# Patient Record
Sex: Male | Born: 1950 | Race: Black or African American | Hispanic: No | Marital: Married | Smoking: Former smoker
Health system: Southern US, Community
[De-identification: ages and names within clinical notes are randomized; demographics above are authoritative.]

## PROBLEM LIST (undated history)

## (undated) DIAGNOSIS — I10 Essential (primary) hypertension: Secondary | ICD-10-CM

## (undated) DIAGNOSIS — I739 Peripheral vascular disease, unspecified: Secondary | ICD-10-CM

## (undated) HISTORY — DX: Essential (primary) hypertension: I10

---

## 2007-01-31 HISTORY — PX: FEMUR SURGERY: SHX943

## 2007-12-27 ENCOUNTER — Inpatient Hospital Stay (HOSPITAL_COMMUNITY): Admission: AC | Admit: 2007-12-27 | Discharge: 2007-12-31 | Payer: Self-pay

## 2008-01-28 ENCOUNTER — Encounter (HOSPITAL_COMMUNITY): Admission: RE | Admit: 2008-01-28 | Discharge: 2008-01-30 | Payer: Self-pay | Admitting: Orthopedic Surgery

## 2008-02-03 ENCOUNTER — Encounter (HOSPITAL_COMMUNITY): Admission: RE | Admit: 2008-02-03 | Discharge: 2008-03-04 | Payer: Self-pay | Admitting: Orthopedic Surgery

## 2008-03-05 ENCOUNTER — Encounter (HOSPITAL_COMMUNITY): Admission: RE | Admit: 2008-03-05 | Discharge: 2008-04-04 | Payer: Self-pay | Admitting: Orthopedic Surgery

## 2008-04-07 ENCOUNTER — Encounter (HOSPITAL_COMMUNITY): Admission: RE | Admit: 2008-04-07 | Discharge: 2008-05-11 | Payer: Self-pay | Admitting: Orthopedic Surgery

## 2008-06-18 ENCOUNTER — Ambulatory Visit (HOSPITAL_COMMUNITY): Admission: RE | Admit: 2008-06-18 | Discharge: 2008-06-19 | Payer: Self-pay | Admitting: Orthopedic Surgery

## 2010-05-10 LAB — PROTIME-INR
INR: 0.9 (ref 0.00–1.49)
Prothrombin Time: 12.6 seconds (ref 11.6–15.2)

## 2010-05-10 LAB — COMPREHENSIVE METABOLIC PANEL
AST: 18 U/L (ref 0–37)
Albumin: 4 g/dL (ref 3.5–5.2)
Calcium: 10.2 mg/dL (ref 8.4–10.5)
Glucose, Bld: 100 mg/dL — ABNORMAL HIGH (ref 70–99)
Sodium: 141 mEq/L (ref 135–145)
Total Bilirubin: 0.4 mg/dL (ref 0.3–1.2)

## 2010-05-10 LAB — CBC: MCHC: 33.4 g/dL (ref 30.0–36.0)
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
## 2010-06-14 NOTE — Op Note (Signed)
Ryan [name], Ryan [name] NO.:  [id number]   MEDICAL RECORD NO.:  [id number]          PATIENT TYPE:  OIB   LOCATION:  5030                         FACILITY:  [facility]   PHYSICIAN:  Nadara Mustard, MD     DATE OF BIRTH:  12/07/1950   DATE OF PROCEDURE:  06/18/2008  DATE OF DISCHARGE:                               OPERATIVE REPORT   PREOPERATIVE DIAGNOSIS:  Nonunion, right femur.   POSTOPERATIVE DIAGNOSIS:  Nonunion, right femur.   PROCEDURES:  1. Removal of intramedullary nail.  2. Reaming of the fracture site to 14.5 mm.  3. Placement of intramedullary nail fixation, a 13 x 360 mm nail      locked distally with a 66-mm locking screw.   SURGEON:  Nadara Mustard, MD   ANESTHESIA:  General.   ESTIMATED BLOOD LOSS:  Minimal.   ANTIBIOTICS:  1 g of Kefzol.   DRAINS:  None.   COMPLICATIONS:  None.   TOURNIQUET TIME:  None.   DISPOSITION:  To PACU in stable condition.   INDICATIONS FOR PROCEDURE:  The patient is a 60 year old gentleman with  a nonunion of the femur fracture on the right.  He has failed surgical  intervention with IM nail fixation and presents at this time for  revision internal fixation.  Risks and benefits were discussed including  infection, neurovascular injury, persistent pain, DVT, pulmonary  embolus, and need for additional surgery.  The patient states he  understands and wished to proceed at this time.   DESCRIPTION OF PROCEDURE:  The patient was brought to OR room 10, and  underwent a general anesthetic.  After adequate level of anesthesia  obtained, the patient's right lower extremity was prepped using  DuraPrep, draped in a sterile field, and Ioban was used to cover all  exposed skin.  A lateral incision was made as well as a longitudinal  splitting approach to the patella tendon.  A C-arm fluoroscopy was used  to localize the screw and the screw was removed from the distal  interlock approximately removed half way.  Using a  guidewire followed by  the extraction bolt, the extraction bolt was inserted on the distal  aspect of the nail, and the distal locking screw was left in place to  stabilize the nail for rotation while the extraction bolt was secured.  After the extraction bolt was secured, the lateral screw was removed.  The nail was removed.  The femur was then sequentially reamed up to 14.5  mm for a 13-mm nail.  The 13 x 360 mm nail was inserted.  This was  locked distally with a 66-mm locking screw.  C-arm fluoroscopy verified  reduction both AP and lateral planes and there was good bony graft at  the fracture site secondary to reaming.  The knee was irrigated with  normal saline.  The subcu was closed using 2-0 Vicryl.  Skin was closed  using approximate staples.  The wounds were covered with Adaptic  orthopedic sponges, Kerlix, and Coban.  The patient was extubated and  taken to PACU in stable condition.  Plan for 23-hour observation and  discharge to home tomorrow.      Nadara Mustard, MD  Electronically Signed     MVD/MEDQ  D:  06/18/2008  T:  06/19/2008  Job:  [id number]

## 2010-11-01 LAB — CBC
HCT: 35.6 % — ABNORMAL LOW (ref 39.0–52.0)
HCT: 37.3 % — ABNORMAL LOW (ref 39.0–52.0)
Hemoglobin: 11.5 g/dL — ABNORMAL LOW (ref 13.0–17.0)
Hemoglobin: 11.9 g/dL — ABNORMAL LOW (ref 13.0–17.0)
MCHC: 32 g/dL (ref 30.0–36.0)
MCHC: 32.3 g/dL (ref 30.0–36.0)
MCV: 86.8 fL (ref 78.0–100.0)
Platelets: 212 10*3/uL (ref 150–400)
RBC: 4.1 MIL/uL — ABNORMAL LOW (ref 4.22–5.81)
RBC: 5.27 MIL/uL (ref 4.22–5.81)
RDW: 14.1 % (ref 11.5–15.5)
WBC: 10.2 10*3/uL (ref 4.0–10.5)
WBC: 7.9 10*3/uL (ref 4.0–10.5)

## 2010-11-01 LAB — TYPE AND SCREEN
ABO/RH(D): O POS
Antibody Screen: NEGATIVE

## 2010-11-01 LAB — BASIC METABOLIC PANEL
Calcium: 8.4 mg/dL (ref 8.4–10.5)
Calcium: 8.5 mg/dL (ref 8.4–10.5)
Chloride: 106 mEq/L (ref 96–112)
Creatinine, Ser: 0.61 mg/dL (ref 0.4–1.5)
GFR calc Af Amer: >60 mL/min (ref >60)
Glucose, Bld: 118 mg/dL — ABNORMAL HIGH (ref 70–99)
Potassium: 4.3 mEq/L (ref 3.5–5.1)
Sodium: 133 mEq/L — ABNORMAL LOW (ref 135–145)

## 2010-11-01 LAB — PROTIME-INR
INR: 1.4 (ref 0.00–1.49)
Prothrombin Time: 14.1 seconds (ref 11.6–15.2)

## 2010-11-01 LAB — POCT I-STAT, CHEM 8
BUN: <3 mg/dL — ABNORMAL LOW (ref 6–23)
Calcium, Ion: 1.18 mmol/L (ref 1.12–1.32)
Chloride: 104 meq/L (ref 96–112)
Creatinine, Ser: 0.7 mg/dL (ref 0.4–1.5)
HCT: 48 % (ref 39.0–52.0)

## 2010-11-01 LAB — ABO/RH: ABO/RH(D): O POS

## 2010-11-04 LAB — CBC
HCT: 34.8 % — ABNORMAL LOW (ref 39.0–52.0)
Hemoglobin: 11.5 g/dL — ABNORMAL LOW (ref 13.0–17.0)
WBC: 8.4 10*3/uL (ref 4.0–10.5)

## 2014-04-09 DIAGNOSIS — L0390 Cellulitis, unspecified: Secondary | ICD-10-CM | POA: Diagnosis not present

## 2014-04-09 DIAGNOSIS — I10 Essential (primary) hypertension: Secondary | ICD-10-CM | POA: Diagnosis not present

## 2014-04-10 DIAGNOSIS — L0390 Cellulitis, unspecified: Secondary | ICD-10-CM | POA: Diagnosis not present

## 2014-05-11 DIAGNOSIS — I739 Peripheral vascular disease, unspecified: Secondary | ICD-10-CM | POA: Diagnosis not present

## 2014-05-11 DIAGNOSIS — I10 Essential (primary) hypertension: Secondary | ICD-10-CM | POA: Diagnosis not present

## 2014-05-29 ENCOUNTER — Other Ambulatory Visit: Payer: Self-pay | Admitting: Family Medicine

## 2014-05-29 DIAGNOSIS — I10 Essential (primary) hypertension: Secondary | ICD-10-CM | POA: Diagnosis not present

## 2014-05-29 DIAGNOSIS — I739 Peripheral vascular disease, unspecified: Secondary | ICD-10-CM | POA: Diagnosis not present

## 2014-05-29 DIAGNOSIS — M79606 Pain in leg, unspecified: Secondary | ICD-10-CM

## 2014-06-02 ENCOUNTER — Ambulatory Visit
Admission: RE | Admit: 2014-06-02 | Discharge: 2014-06-02 | Disposition: A | Discharge status: Home / Self Care | Payer: Medicare Other | Source: Ambulatory Visit | Attending: Family Medicine | Admitting: Family Medicine

## 2014-06-02 DIAGNOSIS — M79606 Pain in leg, unspecified: Secondary | ICD-10-CM

## 2014-06-02 DIAGNOSIS — I739 Peripheral vascular disease, unspecified: Secondary | ICD-10-CM | POA: Diagnosis not present

## 2014-06-03 ENCOUNTER — Other Ambulatory Visit: Payer: Self-pay | Admitting: Family Medicine

## 2014-06-03 DIAGNOSIS — I739 Peripheral vascular disease, unspecified: Secondary | ICD-10-CM

## 2014-06-11 ENCOUNTER — Ambulatory Visit
Admission: RE | Admit: 2014-06-11 | Discharge: 2014-06-11 | Disposition: A | Discharge status: Home / Self Care | Payer: Medicare Other | Source: Ambulatory Visit | Attending: Family Medicine | Admitting: Family Medicine

## 2014-06-11 DIAGNOSIS — I739 Peripheral vascular disease, unspecified: Secondary | ICD-10-CM | POA: Diagnosis not present
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
## 2014-07-08 DIAGNOSIS — I10 Essential (primary) hypertension: Secondary | ICD-10-CM | POA: Diagnosis not present

## 2014-07-15 DIAGNOSIS — I10 Essential (primary) hypertension: Secondary | ICD-10-CM | POA: Diagnosis not present

## 2014-07-15 DIAGNOSIS — R9431 Abnormal electrocardiogram [ECG] [EKG]: Secondary | ICD-10-CM | POA: Diagnosis not present

## 2014-07-15 DIAGNOSIS — I998 Other disorder of circulatory system: Secondary | ICD-10-CM | POA: Diagnosis not present

## 2014-07-15 DIAGNOSIS — I739 Peripheral vascular disease, unspecified: Secondary | ICD-10-CM | POA: Diagnosis not present

## 2014-07-27 DIAGNOSIS — R9431 Abnormal electrocardiogram [ECG] [EKG]: Secondary | ICD-10-CM | POA: Diagnosis not present

## 2014-07-27 DIAGNOSIS — Z136 Encounter for screening for cardiovascular disorders: Secondary | ICD-10-CM | POA: Diagnosis not present

## 2014-08-05 DIAGNOSIS — I739 Peripheral vascular disease, unspecified: Secondary | ICD-10-CM | POA: Diagnosis not present

## 2014-08-05 DIAGNOSIS — I10 Essential (primary) hypertension: Secondary | ICD-10-CM | POA: Diagnosis not present
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
## 2014-08-11 ENCOUNTER — Encounter (HOSPITAL_COMMUNITY): Payer: Self-pay | Admitting: Cardiology

## 2014-08-11 ENCOUNTER — Ambulatory Visit (HOSPITAL_COMMUNITY)
Admission: RE | Admit: 2014-08-11 | Discharge: 2014-08-11 | Disposition: A | Discharge status: Home / Self Care | Payer: Medicare Other | Source: Ambulatory Visit | Attending: Cardiology | Admitting: Cardiology

## 2014-08-11 ENCOUNTER — Encounter (HOSPITAL_COMMUNITY)
Admission: RE | Disposition: A | Discharge status: Home / Self Care | Payer: Self-pay | Source: Ambulatory Visit | Attending: Cardiology

## 2014-08-11 DIAGNOSIS — I4510 Unspecified right bundle-branch block: Secondary | ICD-10-CM | POA: Insufficient documentation

## 2014-08-11 DIAGNOSIS — Z8249 Family history of ischemic heart disease and other diseases of the circulatory system: Secondary | ICD-10-CM | POA: Diagnosis not present

## 2014-08-11 DIAGNOSIS — I70213 Atherosclerosis of native arteries of extremities with intermittent claudication, bilateral legs: Secondary | ICD-10-CM | POA: Insufficient documentation

## 2014-08-11 DIAGNOSIS — F17210 Nicotine dependence, cigarettes, uncomplicated: Secondary | ICD-10-CM | POA: Insufficient documentation

## 2014-08-11 DIAGNOSIS — I739 Peripheral vascular disease, unspecified: Secondary | ICD-10-CM | POA: Diagnosis present

## 2014-08-11 DIAGNOSIS — I7092 Chronic total occlusion of artery of the extremities: Secondary | ICD-10-CM | POA: Diagnosis not present

## 2014-08-11 DIAGNOSIS — Z7982 Long term (current) use of aspirin: Secondary | ICD-10-CM | POA: Insufficient documentation

## 2014-08-11 DIAGNOSIS — I10 Essential (primary) hypertension: Secondary | ICD-10-CM | POA: Diagnosis not present

## 2014-08-11 HISTORY — PX: PERIPHERAL VASCULAR CATHETERIZATION: SHX172C

## 2014-08-11 LAB — POCT ACTIVATED CLOTTING TIME
ACTIVATED CLOTTING TIME: 257 s
ACTIVATED CLOTTING TIME: 214 s
Activated Clotting Time: 239 seconds
Activated Clotting Time: 263 seconds

## 2014-08-11 SURGERY — LOWER EXTREMITY ANGIOGRAPHY
Anesthesia: LOCAL

## 2014-08-11 MED ORDER — HEPARIN SODIUM (PORCINE) 1000 UNIT/ML IJ SOLN
INTRAMUSCULAR | Status: AC
  Filled 2014-08-11: qty 1

## 2014-08-11 MED ORDER — FENTANYL CITRATE (PF) 100 MCG/2ML IJ SOLN
INTRAMUSCULAR | Status: AC
  Filled 2014-08-11: qty 2

## 2014-08-11 MED ORDER — IODIXANOL 320 MG/ML IV SOLN
INTRAVENOUS | Status: DC | PRN
  Administered 2014-08-11: 268 mL via INTRAVENOUS

## 2014-08-11 MED ORDER — CLOPIDOGREL BISULFATE 75 MG PO TABS: 75.0000 mg | ORAL_TABLET | Freq: Every day | ORAL | Status: DC

## 2014-08-11 MED ORDER — LIDOCAINE HCL (PF) 1 % IJ SOLN
INTRAMUSCULAR | Status: AC
  Filled 2014-08-11: qty 30

## 2014-08-11 MED ORDER — CLOPIDOGREL BISULFATE 75 MG PO TABS
600.0000 mg | ORAL_TABLET | Freq: Once | ORAL | Status: AC
  Administered 2014-08-11: 600 mg via ORAL

## 2014-08-11 MED ORDER — FENTANYL CITRATE (PF) 100 MCG/2ML IJ SOLN
INTRAMUSCULAR | Status: DC | PRN
  Administered 2014-08-11 (×2): 25 ug via INTRAVENOUS

## 2014-08-11 MED ORDER — NITROGLYCERIN 1 MG/10 ML FOR IR/CATH LAB
INTRA_ARTERIAL | Status: DC | PRN
  Administered 2014-08-11: 11:00:00

## 2014-08-11 MED ORDER — HYDROMORPHONE BOLUS VIA INFUSION
0.5000 mg | Freq: Once | INTRAVENOUS | Status: DC
Start: 2014-08-11 — End: 2014-08-11

## 2014-08-11 MED ORDER — SODIUM CHLORIDE 0.9 % IV SOLN
INTRAVENOUS | Status: DC
  Administered 2014-08-11: 08:00:00 via INTRAVENOUS

## 2014-08-11 MED ORDER — NITROGLYCERIN 1 MG/10 ML FOR IR/CATH LAB
INTRA_ARTERIAL | Status: AC
  Filled 2014-08-11: qty 10

## 2014-08-11 MED ORDER — CLOPIDOGREL BISULFATE 300 MG PO TABS
ORAL_TABLET | ORAL | Status: AC
  Filled 2014-08-11: qty 2

## 2014-08-11 MED ORDER — HYDROMORPHONE HCL 1 MG/ML IJ SOLN
INTRAMUSCULAR | Status: AC
  Administered 2014-08-11: 0.5 mg
  Filled 2014-08-11: qty 1

## 2014-08-11 MED ORDER — HEPARIN SODIUM (PORCINE) 1000 UNIT/ML IJ SOLN
INTRAMUSCULAR | Status: DC | PRN
  Administered 2014-08-11 (×2): 2000 [IU] via INTRAVENOUS
  Administered 2014-08-11: 6000 [IU] via INTRAVENOUS
  Administered 2014-08-11: 2000 [IU] via INTRAVENOUS

## 2014-08-11 MED ORDER — SODIUM CHLORIDE 0.9 % IV SOLN: 1.0000 mL/kg/h | INTRAVENOUS | Status: DC

## 2014-08-11 MED ORDER — HEPARIN (PORCINE) IN NACL 2-0.9 UNIT/ML-% IJ SOLN
INTRAMUSCULAR | Status: AC
  Filled 2014-08-11: qty 1000

## 2014-08-11 SURGICAL SUPPLY — 31 items
BALLOON SABER 5.0X250X150 (BALLOONS) ×2 IMPLANT
CATH ANGIO 5F PIGTAIL 65CM (CATHETERS) ×2 IMPLANT
CATH CXI SUPP ST 2.6FR 150CM (MICROCATHETER) ×2 IMPLANT
CATH MICROGUIDE FINCRSS 150 CM (MICROCATHETER) ×1 IMPLANT
CATH QUICKCROSS SUPP .035X90CM (MICROCATHETER) ×2 IMPLANT
CATH SOFT-VU ST 4F 90CM (CATHETERS) ×2 IMPLANT
CATH VIANCE CROSS STAND 150CM (MICROCATHETER) ×2
CATH VIANCE CROSS STD 150CM (MICROCATHETER) ×1 IMPLANT
DEVICE TORQUE .014-.018 (MISCELLANEOUS) ×1 IMPLANT
GLIDEWIRE ADV .035X180CM (WIRE) ×2 IMPLANT
HAND CONTROLLER AVANTA (MISCELLANEOUS) ×2 IMPLANT
KIT ENCORE 26 ADVANTAGE (KITS) ×2 IMPLANT
KIT PV (KITS) ×2 IMPLANT
MICROGUIDE FINECROSS 150 CM (MICROCATHETER) ×2
SET AVANTA MULTI PATIENT (MISCELLANEOUS) IMPLANT
SET AVANTA SINGLE PATIENT (MISCELLANEOUS) ×2 IMPLANT
SHEATH AVANTA HAND CONTROLLER (MISCELLANEOUS) ×2 IMPLANT
SHEATH FLEXOR ANSEL 1 7F 45CM (SHEATH) ×2 IMPLANT
SHEATH PINNACLE 5F 10CM (SHEATH) ×2 IMPLANT
STENT VIABAHN 6X150X120 (Permanent Stent) ×2 IMPLANT
STENT VIABAHN 6X250X120 (Permanent Stent) ×2 IMPLANT
SYR MEDRAD MARK V 150ML (SYRINGE) IMPLANT
TAPE RADIOPAQUE TURBO (MISCELLANEOUS) ×4 IMPLANT
TORQUE DEVICE .014-.018 (MISCELLANEOUS) ×2
TRANSDUCER W/STOPCOCK (MISCELLANEOUS) ×2 IMPLANT
TRAY PV CATH (CUSTOM PROCEDURE TRAY) ×2 IMPLANT
WIRE APROACH 12G .014X300CM (WIRE) ×2 IMPLANT
WIRE APROACH 18G .014X300CM (WIRE) ×2 IMPLANT
WIRE HI TORQ BMW 300CM (WIRE) ×2 IMPLANT
WIRE HI TORQ WHISPER MS 300CM (WIRE) ×2 IMPLANT
WIRE HITORQ VERSACORE ST 145CM (WIRE) ×2 IMPLANT

## 2014-08-11 NOTE — Progress Notes (Signed)
Site area: rt groin Site Prior to Removal:  Level 0 Pressure Applied For:  40 minutes Manual:   yes Patient Status During Pull:  stable Post Pull Site:  Level  0 Post Pull Instructions Given:  yes Post Pull Pulses Present: yes Dressing Applied:  tegaderm Bedrest begins @  1410  Comments:  0

## 2014-08-11 NOTE — Progress Notes (Signed)
C/o left toe pain 5/10 gradually increasing. left Foot and leg are warm, no changes in pulses, Dr Jacinto HalimGanji was notified and orders followed.

## 2014-08-11 NOTE — Discharge Instructions (Signed)
Start plavix/ clopedigrel 75 mg daily   Angiogram, Care After Refer to this sheet in the next few weeks. These instructions provide you with information on caring for yourself after your procedure. Your health care provider may also give you more specific instructions. Your treatment has been planned according to current medical practices, but problems sometimes occur. Call your health care provider if you have any problems or questions after your procedure.  WHAT TO EXPECT AFTER THE PROCEDURE After your procedure, it is typical to have the following sensations:  Minor discomfort or tenderness and a small bump at the catheter insertion site. The bump should usually decrease in size and tenderness within 1 to 2 weeks.  Any bruising will usually fade within 2 to 4 weeks. HOME CARE INSTRUCTIONS   You may need to keep taking blood thinners if they were prescribed for you. Take medicines only as directed by your health care provider.  Do not apply powder or lotion to the site.  Do not take baths, swim, or use a hot tub until your health care provider approves.  You may shower 24 hours after the procedure. Remove the bandage (dressing) and gently wash the site with plain soap and water. Gently pat the site dry.  Inspect the site at least twice daily.  Limit your activity for the first 48 hours. Do not bend, squat, or lift anything over 20 lb (9 kg) or as directed by your health care provider.  Plan to have someone take you home after the procedure. Follow instructions about when you can drive or return to work. SEEK MEDICAL CARE IF:  You get light-headed when standing up.  You have drainage (other than a small amount of blood on the dressing).  You have chills.  You have a fever.  You have redness, warmth, swelling, or pain at the insertion site. SEEK IMMEDIATE MEDICAL CARE IF:   You develop chest pain or shortness of breath, feel faint, or pass out.  You have bleeding, swelling  larger than a walnut, or drainage from the catheter insertion site.  You develop pain, discoloration, coldness, or severe bruising in the leg or arm that held the catheter.  You develop bleeding from any other place, such as the bowels. You may see bright red blood in your urine or stools, or your stools may appear black and tarry.  You have heavy bleeding from the site. If this happens, hold pressure on the site. CALL 911 MAKE SURE YOU:  Understand these instructions.  Will watch your condition.  Will get help right away if you are not doing well or get worse. Document Released: 08/04/2004 Document Revised: 06/02/2013 Document Reviewed: 06/10/2012 [facility] Patient Information 2015 [place], [place]. This information is not intended to replace advice given to you by your health care provider. Make sure you discuss any questions you have with your health care provider.

## 2014-08-11 NOTE — Progress Notes (Signed)
Unable to aspirate from arterial sheath. Rt dp dopplered. Dr. Jacinto HalimGanji made aware. To pull at 1330

## 2014-08-11 NOTE — Interval H&P Note (Signed)
History and Physical Interval Note:  08/11/2014 8:51 AM  Ryan [name]  has presented today for surgery, with the diagnosis of pad  The various methods of treatment have been discussed with the patient and family. After consideration of risks, benefits and other options for treatment, the patient has consented to  Procedure(s): Lower Extremity Angiography (N/A) and possible PTA as a surgical intervention .  The patient's history has been reviewed, patient examined, no change in status, stable for surgery.  I have reviewed the patient's chart and labs.  Questions were answered to the patient's satisfaction.     Yates DecampGANJI, Mirta Mally

## 2014-08-20 DIAGNOSIS — I998 Other disorder of circulatory system: Secondary | ICD-10-CM | POA: Diagnosis not present

## 2014-08-20 DIAGNOSIS — I10 Essential (primary) hypertension: Secondary | ICD-10-CM | POA: Diagnosis not present

## 2014-08-20 DIAGNOSIS — I739 Peripheral vascular disease, unspecified: Secondary | ICD-10-CM | POA: Diagnosis not present

## 2014-08-20 DIAGNOSIS — R9431 Abnormal electrocardiogram [ECG] [EKG]: Secondary | ICD-10-CM | POA: Diagnosis not present

## 2014-10-08 DIAGNOSIS — M79604 Pain in right leg: Secondary | ICD-10-CM | POA: Diagnosis not present

## 2014-10-08 DIAGNOSIS — Z7982 Long term (current) use of aspirin: Secondary | ICD-10-CM | POA: Diagnosis not present

## 2014-10-08 DIAGNOSIS — Z7902 Long term (current) use of antithrombotics/antiplatelets: Secondary | ICD-10-CM | POA: Diagnosis not present

## 2014-10-08 DIAGNOSIS — I771 Stricture of artery: Secondary | ICD-10-CM | POA: Diagnosis not present

## 2014-10-08 DIAGNOSIS — I259 Chronic ischemic heart disease, unspecified: Secondary | ICD-10-CM | POA: Diagnosis not present

## 2014-10-08 DIAGNOSIS — F17210 Nicotine dependence, cigarettes, uncomplicated: Secondary | ICD-10-CM | POA: Diagnosis not present

## 2014-10-08 DIAGNOSIS — I743 Embolism and thrombosis of arteries of the lower extremities: Secondary | ICD-10-CM | POA: Diagnosis not present

## 2014-10-08 DIAGNOSIS — I10 Essential (primary) hypertension: Secondary | ICD-10-CM | POA: Diagnosis not present

## 2014-10-08 DIAGNOSIS — I739 Peripheral vascular disease, unspecified: Secondary | ICD-10-CM | POA: Diagnosis not present

## 2014-10-09 DIAGNOSIS — F17210 Nicotine dependence, cigarettes, uncomplicated: Secondary | ICD-10-CM | POA: Diagnosis not present

## 2014-10-09 DIAGNOSIS — I259 Chronic ischemic heart disease, unspecified: Secondary | ICD-10-CM | POA: Diagnosis not present

## 2014-10-09 DIAGNOSIS — M79604 Pain in right leg: Secondary | ICD-10-CM | POA: Diagnosis not present

## 2014-10-09 DIAGNOSIS — Z720 Tobacco use: Secondary | ICD-10-CM | POA: Insufficient documentation

## 2014-10-09 DIAGNOSIS — I743 Embolism and thrombosis of arteries of the lower extremities: Secondary | ICD-10-CM | POA: Diagnosis not present

## 2014-10-09 DIAGNOSIS — I739 Peripheral vascular disease, unspecified: Secondary | ICD-10-CM | POA: Diagnosis not present

## 2014-10-09 DIAGNOSIS — I10 Essential (primary) hypertension: Secondary | ICD-10-CM | POA: Insufficient documentation

## 2014-11-04 DIAGNOSIS — I739 Peripheral vascular disease, unspecified: Secondary | ICD-10-CM | POA: Diagnosis not present

## 2014-11-04 DIAGNOSIS — I998 Other disorder of circulatory system: Secondary | ICD-10-CM | POA: Diagnosis not present

## 2014-11-27 DIAGNOSIS — I10 Essential (primary) hypertension: Secondary | ICD-10-CM | POA: Diagnosis not present

## 2014-11-27 DIAGNOSIS — I998 Other disorder of circulatory system: Secondary | ICD-10-CM | POA: Diagnosis not present

## 2014-11-27 DIAGNOSIS — I739 Peripheral vascular disease, unspecified: Secondary | ICD-10-CM | POA: Diagnosis not present

## 2014-11-27 DIAGNOSIS — F17209 Nicotine dependence, unspecified, with unspecified nicotine-induced disorders: Secondary | ICD-10-CM | POA: Diagnosis not present

## 2015-03-08 DIAGNOSIS — I10 Essential (primary) hypertension: Secondary | ICD-10-CM | POA: Diagnosis not present

## 2015-03-08 DIAGNOSIS — E785 Hyperlipidemia, unspecified: Secondary | ICD-10-CM | POA: Diagnosis not present

## 2015-03-08 DIAGNOSIS — Z6825 Body mass index (BMI) 25.0-25.9, adult: Secondary | ICD-10-CM | POA: Diagnosis not present

## 2015-03-15 DIAGNOSIS — R7309 Other abnormal glucose: Secondary | ICD-10-CM | POA: Diagnosis not present

## 2015-03-15 DIAGNOSIS — Z716 Tobacco abuse counseling: Secondary | ICD-10-CM | POA: Diagnosis not present

## 2015-03-15 DIAGNOSIS — I7389 Other specified peripheral vascular diseases: Secondary | ICD-10-CM | POA: Diagnosis not present

## 2015-03-15 DIAGNOSIS — I10 Essential (primary) hypertension: Secondary | ICD-10-CM | POA: Diagnosis not present

## 2015-03-22 DIAGNOSIS — E118 Type 2 diabetes mellitus with unspecified complications: Secondary | ICD-10-CM | POA: Diagnosis not present

## 2015-04-19 DIAGNOSIS — E1165 Type 2 diabetes mellitus with hyperglycemia: Secondary | ICD-10-CM | POA: Diagnosis not present

## 2015-04-19 DIAGNOSIS — I10 Essential (primary) hypertension: Secondary | ICD-10-CM | POA: Diagnosis not present

## 2015-04-19 DIAGNOSIS — Z6825 Body mass index (BMI) 25.0-25.9, adult: Secondary | ICD-10-CM | POA: Diagnosis not present

## 2015-05-24 DIAGNOSIS — E118 Type 2 diabetes mellitus with unspecified complications: Secondary | ICD-10-CM | POA: Diagnosis not present

## 2015-08-23 DIAGNOSIS — T676XXA Heat fatigue, transient, initial encounter: Secondary | ICD-10-CM | POA: Diagnosis not present

## 2015-08-23 DIAGNOSIS — F17200 Nicotine dependence, unspecified, uncomplicated: Secondary | ICD-10-CM | POA: Diagnosis not present

## 2015-08-23 DIAGNOSIS — I10 Essential (primary) hypertension: Secondary | ICD-10-CM | POA: Diagnosis not present

## 2015-08-23 DIAGNOSIS — Z125 Encounter for screening for malignant neoplasm of prostate: Secondary | ICD-10-CM | POA: Diagnosis not present

## 2015-08-23 DIAGNOSIS — E118 Type 2 diabetes mellitus with unspecified complications: Secondary | ICD-10-CM | POA: Diagnosis not present

## 2015-11-01 DIAGNOSIS — I10 Essential (primary) hypertension: Secondary | ICD-10-CM | POA: Diagnosis not present

## 2015-11-01 DIAGNOSIS — F17210 Nicotine dependence, cigarettes, uncomplicated: Secondary | ICD-10-CM | POA: Diagnosis not present

## 2015-11-01 DIAGNOSIS — E119 Type 2 diabetes mellitus without complications: Secondary | ICD-10-CM | POA: Diagnosis not present

## 2015-11-01 DIAGNOSIS — Z23 Encounter for immunization: Secondary | ICD-10-CM | POA: Diagnosis not present

## 2016-02-14 ENCOUNTER — Other Ambulatory Visit (HOSPITAL_COMMUNITY): Payer: Self-pay | Admitting: Family Medicine

## 2016-02-14 DIAGNOSIS — R1310 Dysphagia, unspecified: Secondary | ICD-10-CM

## 2016-02-14 DIAGNOSIS — E118 Type 2 diabetes mellitus with unspecified complications: Secondary | ICD-10-CM | POA: Diagnosis not present

## 2016-02-14 DIAGNOSIS — I10 Essential (primary) hypertension: Secondary | ICD-10-CM | POA: Diagnosis not present

## 2016-02-18 ENCOUNTER — Ambulatory Visit (HOSPITAL_COMMUNITY)
Admission: RE | Admit: 2016-02-18 | Discharge: 2016-02-18 | Disposition: A | Discharge status: Home / Self Care | Payer: Medicare Other | Source: Ambulatory Visit | Attending: Family Medicine | Admitting: Family Medicine

## 2016-02-18 ENCOUNTER — Ambulatory Visit (HOSPITAL_COMMUNITY): Payer: Medicare Other

## 2016-02-18 DIAGNOSIS — K449 Diaphragmatic hernia without obstruction or gangrene: Secondary | ICD-10-CM | POA: Diagnosis not present

## 2016-02-18 DIAGNOSIS — R1310 Dysphagia, unspecified: Secondary | ICD-10-CM | POA: Diagnosis not present

## 2016-03-15 DIAGNOSIS — E785 Hyperlipidemia, unspecified: Secondary | ICD-10-CM | POA: Diagnosis not present

## 2016-03-15 DIAGNOSIS — I10 Essential (primary) hypertension: Secondary | ICD-10-CM | POA: Diagnosis not present

## 2016-07-14 DIAGNOSIS — R634 Abnormal weight loss: Secondary | ICD-10-CM | POA: Diagnosis not present

## 2016-07-14 DIAGNOSIS — I10 Essential (primary) hypertension: Secondary | ICD-10-CM | POA: Diagnosis not present

## 2016-07-14 DIAGNOSIS — F17210 Nicotine dependence, cigarettes, uncomplicated: Secondary | ICD-10-CM | POA: Diagnosis not present

## 2016-07-14 DIAGNOSIS — I70292 Other atherosclerosis of native arteries of extremities, left leg: Secondary | ICD-10-CM | POA: Diagnosis not present

## 2016-07-14 DIAGNOSIS — E118 Type 2 diabetes mellitus with unspecified complications: Secondary | ICD-10-CM | POA: Diagnosis not present

## 2016-07-31 DIAGNOSIS — I10 Essential (primary) hypertension: Secondary | ICD-10-CM | POA: Diagnosis not present

## 2016-07-31 DIAGNOSIS — F17290 Nicotine dependence, other tobacco product, uncomplicated: Secondary | ICD-10-CM | POA: Diagnosis not present

## 2016-07-31 DIAGNOSIS — E118 Type 2 diabetes mellitus with unspecified complications: Secondary | ICD-10-CM | POA: Diagnosis not present

## 2016-07-31 DIAGNOSIS — L608 Other nail disorders: Secondary | ICD-10-CM | POA: Diagnosis not present

## 2016-08-08 DIAGNOSIS — B351 Tinea unguium: Secondary | ICD-10-CM | POA: Diagnosis not present

## 2016-08-08 DIAGNOSIS — M19072 Primary osteoarthritis, left ankle and foot: Secondary | ICD-10-CM | POA: Diagnosis not present

## 2016-08-08 DIAGNOSIS — M19071 Primary osteoarthritis, right ankle and foot: Secondary | ICD-10-CM | POA: Diagnosis not present

## 2016-08-15 DIAGNOSIS — B353 Tinea pedis: Secondary | ICD-10-CM | POA: Diagnosis not present

## 2016-08-29 DIAGNOSIS — B353 Tinea pedis: Secondary | ICD-10-CM | POA: Diagnosis not present

## 2016-09-12 DIAGNOSIS — B351 Tinea unguium: Secondary | ICD-10-CM | POA: Diagnosis not present

## 2016-10-26 DIAGNOSIS — I739 Peripheral vascular disease, unspecified: Secondary | ICD-10-CM | POA: Diagnosis not present

## 2016-10-26 DIAGNOSIS — F17209 Nicotine dependence, unspecified, with unspecified nicotine-induced disorders: Secondary | ICD-10-CM | POA: Diagnosis not present

## 2016-10-26 DIAGNOSIS — I10 Essential (primary) hypertension: Secondary | ICD-10-CM | POA: Diagnosis not present

## 2016-10-26 DIAGNOSIS — R9431 Abnormal electrocardiogram [ECG] [EKG]: Secondary | ICD-10-CM | POA: Diagnosis not present

## 2016-11-10 DIAGNOSIS — I739 Peripheral vascular disease, unspecified: Secondary | ICD-10-CM | POA: Diagnosis not present

## 2016-11-23 DIAGNOSIS — R9431 Abnormal electrocardiogram [ECG] [EKG]: Secondary | ICD-10-CM | POA: Diagnosis not present

## 2016-11-23 DIAGNOSIS — I739 Peripheral vascular disease, unspecified: Secondary | ICD-10-CM | POA: Diagnosis not present
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
## 2016-11-28 ENCOUNTER — Encounter (HOSPITAL_COMMUNITY)
Admission: RE | Disposition: A | Discharge status: Home / Self Care | Payer: Self-pay | Source: Ambulatory Visit | Attending: Cardiology

## 2016-11-28 ENCOUNTER — Inpatient Hospital Stay (HOSPITAL_COMMUNITY)
Admission: RE | Admit: 2016-11-28 | Discharge: 2016-11-30 | DRG: 272 | Disposition: A | Discharge status: Home / Self Care | Payer: Medicare Other | Source: Ambulatory Visit | Attending: Cardiology | Admitting: Cardiology

## 2016-11-28 ENCOUNTER — Encounter (HOSPITAL_COMMUNITY): Payer: Self-pay | Admitting: Cardiology

## 2016-11-28 DIAGNOSIS — F17210 Nicotine dependence, cigarettes, uncomplicated: Secondary | ICD-10-CM | POA: Diagnosis present

## 2016-11-28 DIAGNOSIS — Z79899 Other long term (current) drug therapy: Secondary | ICD-10-CM

## 2016-11-28 DIAGNOSIS — I739 Peripheral vascular disease, unspecified: Secondary | ICD-10-CM | POA: Diagnosis present

## 2016-11-28 DIAGNOSIS — T82868A Thrombosis of vascular prosthetic devices, implants and grafts, initial encounter: Secondary | ICD-10-CM | POA: Diagnosis present

## 2016-11-28 DIAGNOSIS — Z716 Tobacco abuse counseling: Secondary | ICD-10-CM

## 2016-11-28 DIAGNOSIS — Y838 Other surgical procedures as the cause of abnormal reaction of the patient, or of later complication, without mention of misadventure at the time of the procedure: Secondary | ICD-10-CM | POA: Diagnosis present

## 2016-11-28 DIAGNOSIS — I70213 Atherosclerosis of native arteries of extremities with intermittent claudication, bilateral legs: Secondary | ICD-10-CM | POA: Diagnosis present

## 2016-11-28 DIAGNOSIS — Z7982 Long term (current) use of aspirin: Secondary | ICD-10-CM

## 2016-11-28 DIAGNOSIS — Z833 Family history of diabetes mellitus: Secondary | ICD-10-CM | POA: Diagnosis not present

## 2016-11-28 DIAGNOSIS — E785 Hyperlipidemia, unspecified: Secondary | ICD-10-CM | POA: Diagnosis present

## 2016-11-28 DIAGNOSIS — I10 Essential (primary) hypertension: Secondary | ICD-10-CM | POA: Diagnosis present

## 2016-11-28 HISTORY — PX: LOWER EXTREMITY ANGIOGRAPHY: CATH118251

## 2016-11-28 HISTORY — PX: PERIPHERAL VASCULAR BALLOON ANGIOPLASTY: CATH118281

## 2016-11-28 HISTORY — PX: PERIPHERAL VASCULAR THROMBECTOMY: CATH118306

## 2016-11-28 HISTORY — PX: ABDOMINAL AORTOGRAM W/LOWER EXTREMITY: CATH118223

## 2016-11-28 LAB — CBC
HCT: 36.3 % — ABNORMAL LOW (ref 39.0–52.0)
HCT: 38.5 % — ABNORMAL LOW (ref 39.0–52.0)
HCT: 35.8 % — ABNORMAL LOW (ref 39.0–52.0)
HEMOGLOBIN: 11.6 g/dL — AB (ref 13.0–17.0)
HEMOGLOBIN: 11.7 g/dL — AB (ref 13.0–17.0)
Hemoglobin: 12.2 g/dL — ABNORMAL LOW (ref 13.0–17.0)
MCH: 27.1 pg (ref 26.0–34.0)
MCH: 26.9 pg (ref 26.0–34.0)
MCH: 27.6 pg (ref 26.0–34.0)
MCHC: 32 g/dL (ref 30.0–36.0)
MCHC: 31.7 g/dL (ref 30.0–36.0)
MCHC: 32.7 g/dL (ref 30.0–36.0)
MCV: 84.8 fL (ref 78.0–100.0)
MCV: 85 fL (ref 78.0–100.0)
MCV: 84.4 fL (ref 78.0–100.0)
PLATELETS: 207 10*3/uL (ref 150–400)
Platelets: 207 10*3/uL (ref 150–400)
Platelets: 187 10*3/uL (ref 150–400)
RBC: 4.28 MIL/uL (ref 4.22–5.81)
RBC: 4.53 MIL/uL (ref 4.22–5.81)
RBC: 4.24 MIL/uL (ref 4.22–5.81)
RDW: 14.3 % (ref 11.5–15.5)
RDW: 14.4 % (ref 11.5–15.5)
RDW: 14 % (ref 11.5–15.5)
WBC: 5.2 10*3/uL (ref 4.0–10.5)
WBC: 7.1 10*3/uL (ref 4.0–10.5)
WBC: 6.3 10*3/uL (ref 4.0–10.5)

## 2016-11-28 LAB — POCT ACTIVATED CLOTTING TIME
ACTIVATED CLOTTING TIME: 197 s
ACTIVATED CLOTTING TIME: 428 s
Activated Clotting Time: 318 seconds

## 2016-11-28 LAB — MRSA PCR SCREENING: MRSA BY PCR: NEGATIVE

## 2016-11-28 LAB — HEPARIN LEVEL (UNFRACTIONATED): HEPARIN UNFRACTIONATED: 0.14 [IU]/mL — AB (ref 0.30–0.70)

## 2016-11-28 LAB — FIBRINOGEN
FIBRINOGEN: 446 mg/dL (ref 210–475)
FIBRINOGEN: 448 mg/dL (ref 210–475)
Fibrinogen: 450 mg/dL (ref 210–475)

## 2016-11-28 SURGERY — LOWER EXTREMITY ANGIOGRAPHY
Anesthesia: LOCAL | Laterality: Left

## 2016-11-28 SURGERY — ABDOMINAL AORTOGRAM W/LOWER EXTREMITY
Anesthesia: LOCAL

## 2016-11-28 MED ORDER — FENTANYL CITRATE (PF) 100 MCG/2ML IJ SOLN
INTRAMUSCULAR | Status: AC
  Filled 2016-11-28: qty 2

## 2016-11-28 MED ORDER — SODIUM CHLORIDE 0.9 % IV SOLN: 250.0000 mL | INTRAVENOUS | Status: DC | PRN

## 2016-11-28 MED ORDER — HEPARIN SODIUM (PORCINE) 1000 UNIT/ML IJ SOLN
INTRAMUSCULAR | Status: AC
  Filled 2016-11-28: qty 1

## 2016-11-28 MED ORDER — MORPHINE SULFATE (PF) 10 MG/ML IV SOLN: 5.0000 mg | INTRAVENOUS | Status: DC | PRN

## 2016-11-28 MED ORDER — SODIUM CHLORIDE 0.9 % IV SOLN
INTRAVENOUS | Status: DC
  Filled 2016-11-28: qty 10

## 2016-11-28 MED ORDER — ASPIRIN EC 81 MG PO TBEC
81.0000 mg | DELAYED_RELEASE_TABLET | Freq: Every day | ORAL | Status: DC
  Administered 2016-11-29 – 2016-11-30 (×2): 81 mg via ORAL
  Filled 2016-11-28 (×3): qty 1

## 2016-11-28 MED ORDER — ONDANSETRON HCL 4 MG/2ML IJ SOLN: 4.0000 mg | Freq: Four times a day (QID) | INTRAMUSCULAR | Status: DC | PRN

## 2016-11-28 MED ORDER — SODIUM CHLORIDE 0.9% FLUSH: 3.0000 mL | INTRAVENOUS | Status: DC | PRN

## 2016-11-28 MED ORDER — ATENOLOL 25 MG PO TABS
50.0000 mg | ORAL_TABLET | Freq: Every day | ORAL | Status: DC
  Administered 2016-11-29: 50 mg via ORAL
  Filled 2016-11-28 (×2): qty 2

## 2016-11-28 MED ORDER — TIROFIBAN (AGGRASTAT) BOLUS VIA INFUSION
INTRAVENOUS | Status: DC | PRN
  Administered 2016-11-28: 1735 ug via INTRAVENOUS

## 2016-11-28 MED ORDER — HYDRALAZINE HCL 20 MG/ML IJ SOLN
INTRAMUSCULAR | Status: DC | PRN
  Administered 2016-11-28 (×2): 10 mg via INTRAVENOUS

## 2016-11-28 MED ORDER — LABETALOL HCL 5 MG/ML IV SOLN: 10.0000 mg | INTRAVENOUS | Status: DC | PRN

## 2016-11-28 MED ORDER — MIDAZOLAM HCL 2 MG/2ML IJ SOLN
INTRAMUSCULAR | Status: DC | PRN
  Administered 2016-11-28: 0.5 mg via INTRAVENOUS

## 2016-11-28 MED ORDER — ROSUVASTATIN CALCIUM 20 MG PO TABS
20.0000 mg | ORAL_TABLET | Freq: Every day | ORAL | Status: DC
  Administered 2016-11-29: 20 mg via ORAL
  Filled 2016-11-28 (×2): qty 1

## 2016-11-28 MED ORDER — HYDRALAZINE HCL 20 MG/ML IJ SOLN: 5.0000 mg | INTRAMUSCULAR | Status: DC | PRN

## 2016-11-28 MED ORDER — MORPHINE SULFATE (PF) 4 MG/ML IV SOLN
INTRAVENOUS | Status: AC
  Administered 2016-11-28: 5 mg via INTRAVENOUS
  Filled 2016-11-28: qty 2

## 2016-11-28 MED ORDER — SODIUM CHLORIDE 0.9 % IV SOLN: INTRAVENOUS | Status: DC

## 2016-11-28 MED ORDER — NITROGLYCERIN 1 MG/10 ML FOR IR/CATH LAB
INTRA_ARTERIAL | Status: AC
  Filled 2016-11-28: qty 10

## 2016-11-28 MED ORDER — IODIXANOL 320 MG/ML IV SOLN
INTRAVENOUS | Status: DC | PRN
  Administered 2016-11-28: 85 mL via INTRA_ARTERIAL

## 2016-11-28 MED ORDER — TIROFIBAN HCL IN NACL 5-0.9 MG/100ML-% IV SOLN
INTRAVENOUS | Status: DC | PRN
  Administered 2016-11-28: 0.15 ug/kg/min via INTRAVENOUS

## 2016-11-28 MED ORDER — LIDOCAINE HCL 2 % IJ SOLN
INTRAMUSCULAR | Status: AC
  Filled 2016-11-28: qty 20

## 2016-11-28 MED ORDER — MIDAZOLAM HCL 2 MG/2ML IJ SOLN
INTRAMUSCULAR | Status: DC | PRN
  Administered 2016-11-28 (×3): 1 mg via INTRAVENOUS

## 2016-11-28 MED ORDER — HYDRALAZINE HCL 20 MG/ML IJ SOLN
INTRAMUSCULAR | Status: AC
  Filled 2016-11-28: qty 1

## 2016-11-28 MED ORDER — CLOPIDOGREL BISULFATE 75 MG PO TABS
75.0000 mg | ORAL_TABLET | Freq: Every day | ORAL | Status: DC
  Administered 2016-11-29 – 2016-11-30 (×2): 75 mg via ORAL
  Filled 2016-11-28 (×3): qty 1

## 2016-11-28 MED ORDER — HEPARIN (PORCINE) IN NACL 100-0.45 UNIT/ML-% IJ SOLN: 750.0000 [IU]/h | INTRAMUSCULAR | Status: DC

## 2016-11-28 MED ORDER — SODIUM CHLORIDE 0.9 % IV SOLN
0.2500 mg/h | INTRAVENOUS | Status: AC
  Filled 2016-11-28 (×3): qty 10

## 2016-11-28 MED ORDER — NITROGLYCERIN 1 MG/10 ML FOR IR/CATH LAB
INTRA_ARTERIAL | Status: DC | PRN
Start: 2016-11-28 — End: 2016-11-28
  Administered 2016-11-28: 300 ug via INTRA_ARTERIAL
  Administered 2016-11-28: 200 ug via INTRA_ARTERIAL

## 2016-11-28 MED ORDER — SODIUM CHLORIDE 0.9 % IV SOLN
1.0000 mg/h | INTRAVENOUS | Status: DC
  Administered 2016-11-28: 1 mg/h
  Filled 2016-11-28: qty 10

## 2016-11-28 MED ORDER — NITROGLYCERIN IN D5W 200-5 MCG/ML-% IV SOLN
INTRAVENOUS | Status: AC | PRN
  Administered 2016-11-28: 10 ug/min via INTRAVENOUS

## 2016-11-28 MED ORDER — HEPARIN (PORCINE) IN NACL 100-0.45 UNIT/ML-% IJ SOLN
500.0000 [IU]/h | INTRAMUSCULAR | Status: DC
  Administered 2016-11-28: 500 [IU]/h via INTRAVENOUS
  Filled 2016-11-28: qty 250

## 2016-11-28 MED ORDER — LIDOCAINE HCL 2 % IJ SOLN
INTRAMUSCULAR | Status: DC | PRN
  Administered 2016-11-28: 15 mL

## 2016-11-28 MED ORDER — TIROFIBAN HCL IN NACL 5-0.9 MG/100ML-% IV SOLN
INTRAVENOUS | Status: AC
  Filled 2016-11-28: qty 100

## 2016-11-28 MED ORDER — MIDAZOLAM HCL 2 MG/2ML IJ SOLN
INTRAMUSCULAR | Status: AC
  Filled 2016-11-28: qty 2

## 2016-11-28 MED ORDER — SODIUM CHLORIDE 0.9% FLUSH
3.0000 mL | Freq: Two times a day (BID) | INTRAVENOUS | Status: DC
  Administered 2016-11-28 – 2016-11-30 (×4): 3 mL via INTRAVENOUS

## 2016-11-28 MED ORDER — FENTANYL CITRATE (PF) 100 MCG/2ML IJ SOLN
INTRAMUSCULAR | Status: DC | PRN
  Administered 2016-11-28 (×2): 25 ug via INTRAVENOUS

## 2016-11-28 MED ORDER — FENTANYL CITRATE (PF) 100 MCG/2ML IJ SOLN
INTRAMUSCULAR | Status: DC | PRN
  Administered 2016-11-28: 25 ug via INTRAVENOUS

## 2016-11-28 MED ORDER — HEPARIN (PORCINE) IN NACL 2-0.9 UNIT/ML-% IJ SOLN
INTRAMUSCULAR | Status: AC | PRN
  Administered 2016-11-28: 2000 mL

## 2016-11-28 MED ORDER — NITROGLYCERIN IN D5W 200-5 MCG/ML-% IV SOLN
INTRAVENOUS | Status: AC
  Filled 2016-11-28: qty 250

## 2016-11-28 MED ORDER — SODIUM CHLORIDE 0.9 % IV BOLUS (SEPSIS)
500.0000 mL | Freq: Once | INTRAVENOUS | Status: AC
  Administered 2016-11-28: 500 mL via INTRAVENOUS

## 2016-11-28 MED ORDER — HEPARIN (PORCINE) IN NACL 2-0.9 UNIT/ML-% IJ SOLN
INTRAMUSCULAR | Status: AC
  Filled 2016-11-28: qty 1000

## 2016-11-28 MED ORDER — MIDAZOLAM HCL 2 MG/2ML IJ SOLN: 1.0000 mg | INTRAMUSCULAR | Status: DC | PRN

## 2016-11-28 MED ORDER — ZOLPIDEM TARTRATE 5 MG PO TABS
5.0000 mg | ORAL_TABLET | Freq: Every evening | ORAL | Status: DC | PRN
  Administered 2016-11-28: 5 mg via ORAL
  Filled 2016-11-28: qty 1

## 2016-11-28 MED ORDER — HYDROCHLOROTHIAZIDE 25 MG PO TABS
25.0000 mg | ORAL_TABLET | Freq: Every day | ORAL | Status: DC
  Administered 2016-11-29 – 2016-11-30 (×2): 25 mg via ORAL
  Filled 2016-11-28 (×2): qty 1

## 2016-11-28 MED ORDER — SODIUM CHLORIDE 0.9% FLUSH: 3.0000 mL | Freq: Two times a day (BID) | INTRAVENOUS | Status: DC

## 2016-11-28 MED ORDER — HEPARIN SODIUM (PORCINE) 1000 UNIT/ML IJ SOLN
INTRAMUSCULAR | Status: DC | PRN
  Administered 2016-11-28: 2000 [IU] via INTRAVENOUS

## 2016-11-28 MED ORDER — LOSARTAN POTASSIUM 50 MG PO TABS
100.0000 mg | ORAL_TABLET | Freq: Every day | ORAL | Status: DC
  Administered 2016-11-29: 100 mg via ORAL
  Filled 2016-11-28 (×2): qty 2

## 2016-11-28 MED ORDER — HEPARIN SODIUM (PORCINE) 1000 UNIT/ML IJ SOLN
INTRAMUSCULAR | Status: DC | PRN
  Administered 2016-11-28: 6000 [IU] via INTRAVENOUS
  Administered 2016-11-28: 3000 [IU] via INTRAVENOUS

## 2016-11-28 MED ORDER — LOSARTAN POTASSIUM-HCTZ 100-25 MG PO TABS: 1.0000 | ORAL_TABLET | Freq: Every day | ORAL | Status: DC

## 2016-11-28 MED ORDER — IODIXANOL 320 MG/ML IV SOLN
INTRAVENOUS | Status: DC | PRN
  Administered 2016-11-28: 220 mL via INTRA_ARTERIAL

## 2016-11-28 MED ORDER — SODIUM CHLORIDE 0.9 % IV SOLN
INTRAVENOUS | Status: AC | PRN
  Administered 2016-11-28: 50 mL/h via INTRAVENOUS

## 2016-11-28 MED ORDER — OXYCODONE-ACETAMINOPHEN 5-325 MG PO TABS
1.0000 | ORAL_TABLET | ORAL | Status: DC | PRN
  Administered 2016-11-28 – 2016-11-30 (×7): 2 via ORAL
  Administered 2016-11-30: 1 via ORAL
  Filled 2016-11-28 (×5): qty 2
  Filled 2016-11-28: qty 1
  Filled 2016-11-28 (×2): qty 2

## 2016-11-28 MED ORDER — MORPHINE SULFATE (PF) 4 MG/ML IV SOLN
5.0000 mg | INTRAVENOUS | Status: DC | PRN
  Administered 2016-11-28 – 2016-11-29 (×4): 5 mg via INTRAVENOUS
  Filled 2016-11-28 (×3): qty 2

## 2016-11-28 MED ORDER — NITROGLYCERIN 1 MG/10 ML FOR IR/CATH LAB
INTRA_ARTERIAL | Status: DC | PRN
  Administered 2016-11-28: 600 ug via INTRA_ARTERIAL

## 2016-11-28 SURGICAL SUPPLY — 28 items
BALLN CHOCOLATE 6.0X120X120 (BALLOONS) ×3
BALLOON CHOCOLATE 6.0X120X120 (BALLOONS) ×2 IMPLANT
CATH INFUS 135CMX50CM (CATHETERS) ×3 IMPLANT
CATH MUSTANG 4X200X135 (BALLOONS) ×3 IMPLANT
CATH OMNI FLUSH 5F 65CM (CATHETERS) ×3 IMPLANT
CATH SOFT-VU 4F 65 STRAIGHT (CATHETERS) ×2 IMPLANT
CATH SOFT-VU STRAIGHT 4F 65CM (CATHETERS) ×1
CATH TEMPO AQUA 5F 100CM (CATHETERS) ×3 IMPLANT
COVER PRB 48X5XTLSCP FOLD TPE (BAG) ×2 IMPLANT
COVER PROBE 5X48 (BAG) ×1
DEVICE SOLENT OMNI 120CM (CATHETERS) ×3 IMPLANT
DEVICE TORQUE H2O (MISCELLANEOUS) ×3 IMPLANT
GLIDEWIRE ADV .035X260CM (WIRE) ×3 IMPLANT
GUIDEWIRE ANGLED .035X150CM (WIRE) ×3 IMPLANT
KIT ENCORE 26 ADVANTAGE (KITS) ×3 IMPLANT
KIT MICROINTRODUCER STIFF 5F (SHEATH) ×3 IMPLANT
KIT PV (KITS) ×3 IMPLANT
SHEATH HIGHFLEX ANSEL 7FR 55CM (SHEATH) ×3 IMPLANT
SHEATH PINNACLE 5F 10CM (SHEATH) ×3 IMPLANT
STOPCOCK MORSE 400PSI 3WAY (MISCELLANEOUS) ×3 IMPLANT
SYRINGE MEDRAD AVANTA MACH 7 (SYRINGE) ×3 IMPLANT
TRANSDUCER W/STOPCOCK (MISCELLANEOUS) ×3 IMPLANT
TRAY PV CATH (CUSTOM PROCEDURE TRAY) ×3 IMPLANT
TUBING CIL FLEX 10 FLL-RA (TUBING) ×3 IMPLANT
WIRE BENTSON .035X145CM (WIRE) ×3 IMPLANT
WIRE HI TORQ VERSACORE J 260CM (WIRE) ×3 IMPLANT
WIRE ROSEN-J .035X180CM (WIRE) ×3 IMPLANT
WIRE SPARTACORE .014X300CM (WIRE) ×6 IMPLANT

## 2016-11-28 SURGICAL SUPPLY — 9 items
CATH INFUS 135CMX50CM (CATHETERS) ×3 IMPLANT
CATH LAUNCHER 5F MPST (CATHETERS) ×2 IMPLANT
CATHETER LAUNCHER 5F MPST (CATHETERS) ×3
KIT ESSENTIALS PG (KITS) ×3 IMPLANT
KIT PV (KITS) ×3 IMPLANT
SYRINGE MEDRAD AVANTA MACH 7 (SYRINGE) ×3 IMPLANT
TRANSDUCER W/STOPCOCK (MISCELLANEOUS) ×3 IMPLANT
TRAY PV CATH (CUSTOM PROCEDURE TRAY) ×3 IMPLANT
WIRE HITORQ VERSACORE ST 145CM (WIRE) ×3 IMPLANT

## 2016-11-28 NOTE — Progress Notes (Signed)
ANTICOAGULATION CONSULT NOTE - Initial Consult  Pharmacy Consult for Heparin Indication: left SFA occlusion  No Known Allergies  Patient Measurements: Height: 5\' 6"  (167.6 cm) Weight: 153 lb (69.4 kg) IBW/kg (Calculated) : 63.8  Vital Signs: Temp: 97.6 F (36.4 C) (10/30 1123) Temp Source: Oral (10/30 1123) BP: 148/76 (10/30 1022) Pulse Rate: 65 (10/30 1022)  Labs: No results for input(s): HGB, HCT, PLT, APTT, LABPROT, INR, HEPARINUNFRC, HEPRLOWMOCWT, CREATININE, CKTOTAL, CKMB, TROPONINI in the last 72 hours.  CrCl cannot be calculated (Patient's most recent lab result is older than the maximum 21 days allowed.).   Medical History: No past medical history on file.  Assessment: 66yom s/p unsuccessful attempt at angioplasty of the left SFA. S/P thrombectomy but due to persistent thrombus burden, intra-arterial alteplase was started at 1mg /hr. Heparin also started at 500 units/hr, then Dr. Jacinto [name] increased to 800 units/hr and asked pharmacy to take over dosing, aiming for a heparin level 0.2-0.5.  Goal of Therapy:  Heparin level 0.2-0.5 units/ml Monitor platelets by anticoagulation protocol: Yes   Plan:  1) Continue heparin at 800 units/hr 2) Check 6 hour heparin level 3) Continue alteplase at 1mg /hr 4) Follow CBC and fibrinogen levels  Ryan RiggerMarkle, Ryan [name] 11/28/2016,11:36 AM

## 2016-11-28 NOTE — Interval H&P Note (Signed)
History and Physical Interval Note:  11/28/2016 7:36 AM  Ryan [name]  has presented today for surgery, with the diagnosis of pvd  The various methods of treatment have been discussed with the patient and family. After consideration of risks, benefits and other options for treatment, the patient has consented to  Procedure(s): Lower Extremity Angiography (N/A) as a surgical intervention .  The patient's history has been reviewed, patient examined, no change in status, stable for surgery.  I have reviewed the patient's chart and labs.  Questions were answered to the patient's satisfaction.     Abeera Flannery J Virgie Chery

## 2016-11-28 NOTE — Progress Notes (Signed)
I met with patient's wife and 2 of his daughters, I have updated them regarding the complications that occurred including embolic complication and the need for use of thrombolytic therapy and the risks associated with thrombolysis including intracranial hemorrhage, internal bleeding and not limited to this.  I have also informed that he probably will do well as we did establish flow, his option would be probably surgical.  This will be done in elective fashion once he recuperates from this acute phase.

## 2016-11-29 ENCOUNTER — Encounter (HOSPITAL_COMMUNITY): Payer: Self-pay | Admitting: Cardiology

## 2016-11-29 LAB — HEPARIN LEVEL (UNFRACTIONATED)
HEPARIN UNFRACTIONATED: 1.66 [IU]/mL — AB (ref 0.30–0.70)
HEPARIN UNFRACTIONATED: 0.22 [IU]/mL — AB (ref 0.30–0.70)

## 2016-11-29 LAB — CBC
HCT: 35.7 % — ABNORMAL LOW (ref 39.0–52.0)
HEMOGLOBIN: 11.4 g/dL — AB (ref 13.0–17.0)
MCH: 27.1 pg (ref 26.0–34.0)
MCHC: 31.9 g/dL (ref 30.0–36.0)
MCV: 84.8 fL (ref 78.0–100.0)
Platelets: 216 10*3/uL (ref 150–400)
RBC: 4.21 MIL/uL — ABNORMAL LOW (ref 4.22–5.81)
RDW: 14.2 % (ref 11.5–15.5)
WBC: 5.9 10*3/uL (ref 4.0–10.5)

## 2016-11-29 LAB — FIBRINOGEN
FIBRINOGEN: 312 mg/dL (ref 210–475)
Fibrinogen: 511 mg/dL — ABNORMAL HIGH (ref 210–475)

## 2016-11-29 LAB — POCT ACTIVATED CLOTTING TIME: Activated Clotting Time: 120 seconds

## 2016-11-29 MED ORDER — ATROPINE SULFATE 1 MG/10ML IJ SOSY
PREFILLED_SYRINGE | INTRAMUSCULAR | Status: AC
  Filled 2016-11-29: qty 10

## 2016-11-29 MED ORDER — NITROGLYCERIN 0.2 MG/HR TD PT24
0.2000 mg | MEDICATED_PATCH | Freq: Every day | TRANSDERMAL | Status: DC
  Administered 2016-11-29 – 2016-11-30 (×2): 0.2 mg via TRANSDERMAL
  Filled 2016-11-29 (×2): qty 1

## 2016-11-29 MED ORDER — ATENOLOL 25 MG PO TABS
25.0000 mg | ORAL_TABLET | Freq: Every day | ORAL | Status: DC
  Administered 2016-11-30: 25 mg via ORAL
  Filled 2016-11-29: qty 1

## 2016-11-29 MED FILL — Lidocaine HCl Local Inj 2%: INTRAMUSCULAR | Qty: 20 | Status: AC

## 2016-11-29 NOTE — Progress Notes (Signed)
Right artrial sheath pulled at 14:37pm by Devota PaceKaylee Calven Gilkes RN and Kavin LeechSondra Jackson RN. Patient educated before sheath pull and emergency equipment at bedside. Pressure held for 20 mins, vital signs stable before, during and post sheath pull. Pt educated about post cath protocol and bed rest. Will pass information on to primary nurse and will be available to assisted if need.

## 2016-11-29 NOTE — Progress Notes (Signed)
ANTICOAGULATION CONSULT NOTE  Pharmacy Consult for Heparin Indication: left SFA occlusion  No Known Allergies  Patient Measurements: Height: 5\' 6"  (167.6 cm) Weight: 153 lb (69.4 kg) IBW/kg (Calculated) : 63.8  Vital Signs: Temp: 98 F (36.7 C) (10/31 0000) Temp Source: Oral (10/31 0000) BP: 136/66 (10/30 2300) Pulse Rate: 65 (10/30 2300)  Labs:  Recent Labs  11/28/16 1122 11/28/16 2028 11/28/16 2307  HGB 11.6* 12.2* 11.7*  HCT 36.3* 38.5* 35.8*  PLT 207 207 187  HEPARINUNFRC  --   --  0.14*    CrCl cannot be calculated (Patient's most recent lab result is older than the maximum 21 days allowed.).  Assessment: 66 yo male with SFA thrombosis on intra-arterial alteplase for heparin.  Goal of Therapy:  Heparin level 0.2-0.5 units/ml Monitor platelets by anticoagulation protocol: Yes   Plan:  Increase Heparin 750 units/hr Check heparin level in 6 hours.  Jheremy Boger, Gary FleetGregory Vernon 11/29/2016,12:35 AM

## 2016-11-29 NOTE — Progress Notes (Signed)
Subjective:  Left foot feels better but still tender to touch  Objective:  Vital Signs in the last 24 hours: Temp:  [97.8 F (36.6 C)-98.3 F (36.8 C)] 98.1 F (36.7 C) (10/31 1236) Pulse Rate:  [59-78] 59 (10/31 1730) Resp:  [10-22] 10 (10/31 1730) BP: (76-144)/(38-73) 101/68 (10/31 1730) SpO2:  [93 %-100 %] 96 % (10/31 1730)  Intake/Output from previous day: 10/30 0701 - 10/31 0700 In: 509.9 [I.V.:509.9] Out: 1575 [Urine:1575] Intake/Output from this shift: Total I/O In: 355 [P.O.:350; I.V.:5] Out: 850 [Urine:850]  Physical Exam: General appearance: alert, cooperative, appears older than stated age and no distress Lungs: clear to auscultation bilaterally Heart: regular rate and rhythm, S1, S2 normal, no murmur, click, rub or gallop Extremities: Full range of movements.  No edema. Pulses: Upper extremity pulses are normal.  Femoral pulses are normal.  Absent popliteal and pedal pulses bilaterally.  Left foot is slightly colder than the right.  No demarcation or evidence of acute arterial insufficiency. Neurologic: Grossly normal  Lab Results:  Recent Labs  11/28/16 2307 11/29/16 0427  WBC 6.3 5.9  HGB 11.7* 11.4*  PLT 187 216   Cardiac Studies: Peripheral arteriogram 11/28/2016, repeat procedure after this morning procedure: Angiographic data: Significant amount of collaterals have now opened up since lysis.  The left SFA is again flush occluded at the previously stented segment.  The profunda femoral artery shows a large secondary branch with embolic atheroma.  Interventional data: Successful aspiration of the embolic atheroma with the help of a 5 [place] hockey-stick guide and manual aspiration.  Immediately following this, a significant amount of vasculature had opened up with very brisk flow through the profunda femoral artery.  Patient was pain-free after the procedure, the left foot which is cold at the beginning of the procedure was warmer.  Peripheral angiogram  11/28/2016:  Angiographic data: Abdominal aortogram: Bilateral renal arteries one on each side widely patent.  Aortoiliac bifurcation widely patent.  Left femoral artery with distal runoff: Left SFA is occluded at the previous lab on stent implanted site, left distal SFA reconstitutes just above the Hunter's canal.  Below the left knee there is two-vessel runoff in the form of peroneal and posterior tibial artery, anterior tibial artery reconstitutes at the level of the ankle.  Right femoral artery with distal runoff: In a similar way, right SFA is occluded in the proximal segment and reconstitutes in the distal SFA.  Below the right knee there is very slow vessel runoff, with three-vessel runoff is noted at the level of the right ankle.  Intervention data: Complication: Unsuccessful attempt at angioplasty of the left SFA.  After chocolate balloon angioplasty of the left SFA, there was embolic complication involving the profunda femoral artery in the proximal segment, successful aspiration with AngioJet of atheroma burden, due to persistent thrombus burden, patient was started on indwelling intra-arterial TPA administration.  Recommendation: The patient will be infused with TPA before 4-6 hours today, he will be brought back to relook at the profunda anatomy.  At the end of the procedure after thrombectomy and intra-arterial nitroglycerin use, patient did improve with flow and his left foot was warm and popliteal reconstitution was clearly evident.  210 mL contrast utilized.  Patient tolerated the procedure well.  Assessment/Plan:  1.  Severe peripheral arterial disease with embolic complications following balloon angioplasty of the left occluded SFA status post mechanical thrombectomy and thrombolysis with indwelling infusion catheter with TPA  2.  Hypertension 3.  Tobacco use disorder 4.  Hyperlipidemia  Recommendation: I had seen the patient earlier this morning, I also saw the patient  just a few minutes ago, right groin arterial access site without any hematoma or complication and right leg is warm.  Left foot is slightly colder than the right but Doppler pulses are available both PT and DP.  Expect to have foot pain, there does not appear to be cholesterol emboli.  Labs have remained stable.  Up in chair, ambulate and potential home in the morning otherwise when stable.  I will decrease his beta-blocker and also hold his ARB to increase his blood pressure and also to avoid hypotension.  I have also applied nitroglycerin patch to his left foot.  Met his wife and also his daughter.   LOS: 1 day    Adrian Prows 11/29/2016, 6:23 PM

## 2016-11-30 MED ORDER — NITROGLYCERIN 0.2 MG/HR TD PT24: 0.2000 mg | MEDICATED_PATCH | Freq: Every day | TRANSDERMAL | 1 refills | Status: DC

## 2016-11-30 MED ORDER — OXYCODONE-ACETAMINOPHEN 5-325 MG PO TABS: 1.0000 | ORAL_TABLET | ORAL | 0 refills | Status: DC | PRN

## 2016-11-30 MED ORDER — LOSARTAN POTASSIUM-HCTZ 100-25 MG PO TABS: 0.5000 | ORAL_TABLET | Freq: Every day | ORAL | Status: DC

## 2016-11-30 MED ORDER — ATENOLOL 50 MG PO TABS: 25.0000 mg | ORAL_TABLET | Freq: Every day | ORAL | Status: DC

## 2016-11-30 NOTE — Progress Notes (Signed)
0715 Bedside shift report, pt sitting up in chair, A&Ox4, denies pain. Updated with POC, awaiting MD.   1030 Dr. Jacinto HalimGanji at bedside. D/C instructions written.   1045 Pt discharged to home, instructions reviewed with wife and dgt, new medications and wound care instructions reviewed. All questions answered. Pt able to understand without assistance. INT, tel box, and pulse ox removed without complications. Pt assisted getting dressed and pt out to car via wheelchair with cell phone. Wife took all other personal belongings.

## 2016-11-30 NOTE — Discharge Summary (Signed)
Physician Discharge Summary  Patient ID: Ryan [name] MRN: [id number] DOB/AGE: 06/24/1950 66 y.o.  Admit date: 11/28/2016 Discharge date: 11/30/2016  Admission Diagnoses: Severe peripheral arterial disease with critical limb threatening ischemia left leg and moderate ischemia right leg with claudication.  Discharge Diagnoses:  Severe peripheral arterial disease with critical limb threatening ischemia left leg and moderate ischemia right leg with claudication.  Discharged Condition: fair  Hospital Course: Patient was admitted electively on 11/28/2016 for peripheral arteriogram due to severe signs of ischemia left leg worse than the right, but markedly reduced ABI on the left worse in the right, ongoing tobacco use disorder, known peripheral arterial disease with bilateral SFA stents.  He was found to have occluded bilateral SFA stents that was placed previously, these were by upon stents.  He underwent balloon angioplasty for the same, however had atheroemboli into the profunda femoral artery on the left.  Hence underwent urgent mechanical thrombectomy with AngioJet followed by infusion of TPA due to reduced flow distally and concern for thrombus.  He was kept in the ICU during TPA infusion, brought back the same afternoon and had clearly shown improvement in flow and significant collateral bed had opened up on the left lower extremity.  He still had residual atheroemboli in the proximal segment of the left profunda femoral artery which he underwent mechanical aspiration thrombectomy/atheroma with the help of a 5 [place] guide catheter.  Complete extraction was performed with improvement in flow and establishment of all collateral circulation.  TPA was then continued for an additional 8-10 hours, and was discontinued the following morning and the right groin arterial sheath was removed without complication and patient ambulated with very minimal left foot pain felt to be his baseline without any tissue  loss, felt stable for discharge and outpatient management.  Consults: None  Significant Diagnostic Studies:  Peripheral angiogram 11/28/2016:  Angiographic data: Abdominal aortogram: Bilateral renal arteries one on each side widely patent.  Aortoiliac bifurcation widely patent.  Left femoral artery with distal runoff: Left SFA is occluded at the previous lab on stent implanted site, left distal SFA reconstitutes just above the Hunter's canal.  Below the left knee there is two-vessel runoff in the form of peroneal and posterior tibial artery, anterior tibial artery reconstitutes at the level of the ankle.  Right femoral artery with distal runoff: In a similar way, right SFA is occluded in the proximal segment and reconstitutes in the distal SFA.  Below the right knee there is very slow vessel runoff, with three-vessel runoff is noted at the level of the right ankle.  Intervention data: Complication: Unsuccessful attempt at angioplasty of the left SFA.  After chocolate balloon angioplasty of the left SFA, there was embolic complication involving the profunda femoral artery in the proximal segment, successful aspiration with AngioJet of atheroma burden, due to persistent thrombus burden, patient was started on indwelling intra-arterial TPA administration.  Repeat procedure same day: Angiographic data: Significant amount of collaterals have now opened up since lysis.  The left SFA is again flush occluded at the previously stented segment.  The profunda femoral artery shows a large secondary branch with embolic atheroma.  Interventional data: Successful aspiration of the embolic atheroma with the help of a 5 [place] hockey-stick guide and manual aspiration.  Immediately following this, a significant amount of vasculature had opened up with very brisk flow through the profunda femoral artery.  Patient was pain-free after the procedure, the left foot which is cold at the beginning of the procedure  was warmer.   Treatments: Indwelling catheter left profunda femoral artery and thrombolysis  Discharge Exam: Blood pressure (!) 110/59, pulse 64, temperature 98.1 F (36.7 C), temperature source Oral, resp. rate 12, height 5\' 6"  (1.676 m), weight 69.4 kg (153 lb), SpO2 97 %. General appearance: alert, cooperative, appears older than stated age and no distress Resp: clear to auscultation bilaterally Cardio: regular rate and rhythm, S1, S2 normal, no murmur, click, rub or gallop GI: soft, non-tender; bowel sounds normal; no masses,  no organomegaly Extremities: bilateral chronic thin skin, shiny left worse than the right.   No ulceration.  Left foot is slightly colder than the right.  No edema.. Pulses: Bilateral femoral pulses normal, right groin is healed well.  Absent popliteal and pedal pulses bilaterally.  Capillary refill prolonged left worse in the right. Incision/Wound:Right groin arterial access site has healed well.  Disposition: 01-Home or Self Care   Allergies as of 11/30/2016   No Known Allergies     Medication List    TAKE these medications   aspirin 81 MG tablet Take 81 mg by mouth daily.   atenolol 50 MG tablet Commonly known as:  TENORMIN Take 0.5 tablets (25 mg total) by mouth daily. What changed:  how much to take   clopidogrel 75 MG tablet Commonly known as:  PLAVIX Take 1 tablet (75 mg total) by mouth daily.   losartan-hydrochlorothiazide 100-25 MG tablet Commonly known as:  HYZAAR Take 0.5 tablets by mouth daily. What changed:  how much to take   nitroGLYCERIN 0.2 mg/hr patch Commonly known as:  NITRODUR - Dosed in mg/24 hr Place 1 patch (0.2 mg total) onto the skin daily.   oxyCODONE-acetaminophen 5-325 MG tablet Commonly known as:  PERCOCET/ROXICET Take 1-2 tablets by mouth every 4 (four) hours as needed for moderate pain or severe pain.   rosuvastatin 20 MG tablet Commonly known as:  CRESTOR Take 20 mg by mouth daily.      Follow-up  Information    Yates DecampGanji, Hershall Benkert, MD Follow up.   Specialty:  Cardiology Why:  Keep previous appointment Contact information: [address] [place] [address] [phone]           Signed: Yates DecampJay Matvey Llanas , MD, [facility] 11/30/2016, 8:46 AM

## 2016-11-30 NOTE — Care Management Note (Signed)
Case Management Note Donn PieriniKristi Finis Hendricksen RN, BSN Unit 4E-Case Manager-2H coverage [phone]  Patient Details  Name: Ryan MedicusLevon [name] MRN: [id number] Date of Birth: 06/05/1950  Subjective/Objective:  Pt admitted with Severe peripheral arterial disease with embolic complications following balloon angioplasty of the left occluded SFA status post mechanical thrombectomy and thrombolysis with indwelling infusion catheter with TPA                 Action/Plan: PTA pt lived at home- plan to d/c home- no CM needs noted for discharge.   Expected Discharge Date:  11/30/16               Expected Discharge Plan:  Home/Self Care  In-House Referral:     Discharge planning Services  CM Consult, NA  Post Acute Care Choice:  NA Choice offered to:  NA  DME Arranged:    DME Agency:     HH Arranged:    HH Agency:     Status of Service:  Completed, signed off  If discussed at Long Length of Stay Meetings, dates discussed:    Discharge Disposition: home/self care   Additional Comments:  [name], [name], RN 11/30/2016, 10:10 AM

## 2016-12-07 DIAGNOSIS — R9431 Abnormal electrocardiogram [ECG] [EKG]: Secondary | ICD-10-CM | POA: Diagnosis not present

## 2016-12-07 DIAGNOSIS — I10 Essential (primary) hypertension: Secondary | ICD-10-CM | POA: Diagnosis not present

## 2016-12-07 DIAGNOSIS — F17209 Nicotine dependence, unspecified, with unspecified nicotine-induced disorders: Secondary | ICD-10-CM | POA: Diagnosis not present

## 2016-12-07 DIAGNOSIS — I739 Peripheral vascular disease, unspecified: Secondary | ICD-10-CM | POA: Diagnosis not present

## 2016-12-11 ENCOUNTER — Other Ambulatory Visit: Payer: Self-pay | Admitting: Vascular Surgery

## 2016-12-11 ENCOUNTER — Encounter: Payer: Self-pay | Admitting: Vascular Surgery

## 2016-12-11 ENCOUNTER — Encounter: Payer: Self-pay | Admitting: *Deleted

## 2016-12-11 ENCOUNTER — Other Ambulatory Visit: Payer: Self-pay | Admitting: *Deleted

## 2016-12-11 ENCOUNTER — Ambulatory Visit (INDEPENDENT_AMBULATORY_CARE_PROVIDER_SITE_OTHER)
Admission: RE | Admit: 2016-12-11 | Discharge: 2016-12-11 | Disposition: A | Discharge status: Home / Self Care | Payer: Medicare Other | Source: Ambulatory Visit

## 2016-12-11 ENCOUNTER — Ambulatory Visit (INDEPENDENT_AMBULATORY_CARE_PROVIDER_SITE_OTHER): Payer: Medicare Other | Admitting: Vascular Surgery

## 2016-12-11 ENCOUNTER — Ambulatory Visit (INDEPENDENT_AMBULATORY_CARE_PROVIDER_SITE_OTHER)
Admission: RE | Admit: 2016-12-11 | Discharge: 2016-12-11 | Disposition: A | Discharge status: Home / Self Care | Payer: Medicare Other | Source: Ambulatory Visit | Attending: Vascular Surgery | Admitting: Vascular Surgery

## 2016-12-11 VITALS — BP 133/84 | HR 62 | Temp 97.6°F | Resp 20 | Ht 66.0 in | Wt 131.5 lb

## 2016-12-11 DIAGNOSIS — I739 Peripheral vascular disease, unspecified: Secondary | ICD-10-CM

## 2016-12-11 DIAGNOSIS — I70229 Atherosclerosis of native arteries of extremities with rest pain, unspecified extremity: Secondary | ICD-10-CM

## 2016-12-11 DIAGNOSIS — I998 Other disorder of circulatory system: Secondary | ICD-10-CM | POA: Diagnosis not present

## 2016-12-11 NOTE — H&P (View-Only) (Signed)
Requested by:  Mirna MiresHill, Gerald, MD [address] [place] [address]  Reason for consultation: left leg pain   History of Present Illness   [name] is a 66 y.o. (12/29/1950) male who presents with chief complaint: left leg pain.  Onset of symptoms occurred years ago, at that point he had pain c/w intermittent claudication.  On 30 OCT 18, pt underwent endovascular procedure with Dr. Jacinto HalimGanji.  Based on review of records, pt had a balloon angioplasty of the occluded L SFA stents which resulted in L PFA embolism.  He then reportedly got mechanical and thrombolytic management of the embolism (?8-10 hours).  Reportedly the proximal embolism was resolved.  Since the procedure, the patient now has rest pain and is requiring narcotics to ambulation.  Post-procedure ABI reported demonstrate nearly no waveforms in L foot.  Pain is described as constant aching, severity 6-10/10, and associated with ambulation and rest.  Patient notes para/anesthesia in left forefoot.  Patient has attempted to treat this pain with narcotics.  The patient does not have frank gangrene or ulcers at this point.   Atherosclerotic risk factors include: HTN, prior smoker.  Past Medical History:  Diagnosis Date  . Hypertension    Past Surgical History: 1.  Prior B SFA stenting and angioplasty 2.  L SFA PTA complicated by embolism managed endovascularly (see above)  Social History   Socioeconomic History  . Marital status: Married    Spouse name: Not on file  . Number of children: Not on file  . Years of education: Not on file  . Highest education level: Not on file  Social Needs  . Financial resource strain: Not on file  . Food insecurity - worry: Not on file  . Food insecurity - inability: Not on file  . Transportation needs - medical: Not on file  . Transportation needs - non-medical: Not on file  Occupational History  . Not on file  Tobacco Use  . Smoking status: Former Smoker    Last attempt to  quit: 11/10/2016    Years since quitting: 0.0  . Smokeless tobacco: Never Used  Substance and Sexual Activity  . Alcohol use: Not on file  . Drug use: Not on file  . Sexual activity: Not on file  Other Topics Concern  . Not on file  Social History Narrative  . Not on file    Family History: patient is unable to detail the medical history of his parents   Current Outpatient Medications  Medication Sig Dispense Refill  . aspirin 81 MG tablet Take 81 mg by mouth daily.    Marland Kitchen. atenolol (TENORMIN) 50 MG tablet Take 0.5 tablets (25 mg total) by mouth daily.    . clopidogrel (PLAVIX) 75 MG tablet Take 1 tablet (75 mg total) by mouth daily. 30 tablet 6  . losartan-hydrochlorothiazide (HYZAAR) 100-25 MG tablet Take 0.5 tablets by mouth daily.    . nitroGLYCERIN (NITRODUR - DOSED IN MG/24 HR) 0.2 mg/hr patch Place 1 patch (0.2 mg total) onto the skin daily. 30 patch 1  . oxyCODONE-acetaminophen (PERCOCET/ROXICET) 5-325 MG tablet Take 1-2 tablets by mouth every 4 (four) hours as needed for moderate pain or severe pain. 45 tablet 0  . rosuvastatin (CRESTOR) 20 MG tablet Take 20 mg by mouth daily.  1   No current facility-administered medications for this visit.     No Known Allergies  REVIEW OF SYSTEMS (negative unless checked):   Cardiac:  []  Chest pain  or chest pressure? []  Shortness of breath upon activity? []  Shortness of breath when lying flat? []  Irregular heart rhythm?  Vascular:  [x]  Pain in calf, thigh, or hip brought on by walking? [x]  Pain in feet at night that wakes you up from your sleep? [x]  Blood clot in your veins? []  Leg swelling?  Pulmonary:  []  Oxygen at home? []  Productive cough? []  Wheezing?  Neurologic:  []  Sudden weakness in arms or legs? []  Sudden numbness in arms or legs? []  Sudden onset of difficult speaking or slurred speech? []  Temporary loss of vision in one eye? []  Problems with dizziness?  Gastrointestinal:  []  Blood in stool? []  Vomited  blood?  Genitourinary:  []  Burning when urinating? []  Blood in urine?  Psychiatric:  []  Major depression  Hematologic:  []  Bleeding problems? []  Problems with blood clotting?  Dermatologic:  []  Rashes or ulcers?  Constitutional:  []  Fever or chills?  Ear/Nose/Throat:  []  Change in hearing? []  Nose bleeds? []  Sore throat?  Musculoskeletal:  []  Back pain? []  Joint pain? []  Muscle pain?   For VQI Use Only   PRE-ADM LIVING Home  AMB STATUS Ambulatory  CAD Sx None  PRIOR CHF None  STRESS TEST No    Physical Examination     Vitals:   12/11/16 0829  BP: 133/84  Pulse: 62  Resp: 20  Temp: 97.6 F (36.4 C)  TempSrc: Oral  SpO2: 99%  Weight: 131 lb 8 oz (59.6 kg)  Height: 5\' 6"  (1.676 m)   Body mass index is 21.22 kg/m.  General Alert, O x 3, WD, Elderly  Head [place]/AT,    Ear/Nose/ Throat Hearing grossly intact, nares without erythema or drainage, oropharynx without Erythema or Exudate, Mallampati score: 3,   Eyes PERRLA, EOMI,    Neck Supple, mid-line trachea,    Pulmonary Sym exp, good B air movt, CTA B  Cardiac RRR, Nl S1, S2, no Murmurs, No rubs, No S3,S4  Vascular Vessel Right Left  Radial Palpable Palpable  Brachial Palpable Palpable  Carotid Palpable, No Bruit Palpable, No Bruit  Aorta Not palpable N/A  Femoral Palpable Palpable  Popliteal Not palpable Not palpable  PT Not palpable Not palpable  DP Not palpable Not palpable    Gastro- intestinal soft, non-distended, non-tender to palpation, No guarding or rebound, no HSM, no masses, no CVAT B, No palpable prominent aortic pulse,    Musculo- skeletal M/S 5/5 throughout  , Extremities without ischemic changes except ischemic appearing L toes, No edema present, No visible varicosities , No Lipodermatosclerosis present  Neurologic Cranial nerves 2-12 intact , Pain and light touch intact in extremities , Motor exam as listed above  Psychiatric Judgement intact, Mood & affect appropriate for pt's  clinical situation  Dermatologic See M/S exam for extremity exam, No rashes otherwise noted  Lymphatic  Palpable lymph nodes: None    Non-Invasive Vascular Imaging   ABI and BLE GSV Mapping ordered   Outside Studies/Documentation   4 pages of outside documents were reviewed including: Dr. Jacinto HalimGanji recent chart notes.   Medical Decision Making   [name] is a 66 y.o. male who presents with: LLE critical limb ischemia, occluded prior B SFA stents, failed L SFA recannulation complicated by L PFA embolism, no evidence of threatened limb   Pt has numbness with intact motor in L foot.  This has been this patient's clinical exam for the last 2 weeks per the patient, so no need for emergent intervention.  Outside ABI  was not available for review.  This will be repeated along with B GSV mapping for a bypass given the recent endovascular failure.  The question is whether there is residual profunda thromboembolism, which I suspect given the significant worsening in clinical symptoms since Dr. Verl Dicker procedure. Given the limb threatening status of this patient, I recommend an aggressive work up including proceeding with an: L leg runoff. I discussed with the patient the nature of angiographic procedures, especially the limited patencies of any endovascular intervention. The patient is aware of that the risks of an angiographic procedure include but are not limited to: bleeding, infection, access site complications, embolization, rupture of treated vessel, dissection, possible need for emergent surgical intervention, and possible need for surgical procedures to treat the patient's pathology. The patient is aware of the risks and agrees to proceed.  The procedure is scheduled for: 13 NOV 18.  Due to scheduling conflicts, either myself or Dr. Myra Gianotti may be doing the diagnostic study. Tenatively the patient is also scheduled for L CFA to BK pop bypass, possible thrombectomy of L profunda artery with  Dr. Randie Heinz on 15 NOV 18.  I discussed in depth with the patient the nature of atherosclerosis, and emphasized the importance of maximal medical management including strict control of blood pressure, blood glucose, and lipid levels, antiplatelet agents, obtaining regular exercise, and cessation of smoking.  The patient is aware that without maximal medical management the underlying atherosclerotic disease process will progress, limiting the benefit of any interventions. The patient is currently on a statin: Crestor.  The patient is currently on an anti-platelet: ASA and Plavix.  Thank you for allowing [place] to participate in this patient's care.   Leonides Sake, MD, FACS [facility] Office: [phone] Pager: [phone]  12/11/2016, 8:59 AM   Addendum  Non-invasive Vascular Imaging   ABI (12/11/2016)  R:   ABI: 0.46,   PT: mono  DP: mono  TBI:  0.60  L:   ABI: 0,   PT: none  DP: none  TBI: 0  B GSV Mapping (12/11/2016): neither GSV adequate  Leonides Sake, MD, FACS [facility] Office: [phone] Pager: [phone]  12/11/2016, 12:01 PM

## 2016-12-11 NOTE — Progress Notes (Addendum)
Requested by:  Mirna MiresHill, Gerald, MD [address] [place], [place] [address]  Reason for consultation: left leg pain   History of Present Illness   Ryan [name] is a 66 y.o. (12/29/1950) male who presents with chief complaint: left leg pain.  Onset of symptoms occurred years ago, at that point he had pain c/w intermittent claudication.  On 30 OCT 18, pt underwent endovascular procedure with Dr. Jacinto HalimGanji.  Based on review of records, pt had a balloon angioplasty of the occluded L SFA stents which resulted in L PFA embolism.  He then reportedly got mechanical and thrombolytic management of the embolism (?8-10 hours).  Reportedly the proximal embolism was resolved.  Since the procedure, the patient now has rest pain and is requiring narcotics to ambulation.  Post-procedure ABI reported demonstrate nearly no waveforms in L foot.  Pain is described as constant aching, severity 6-10/10, and associated with ambulation and rest.  Patient notes para/anesthesia in left forefoot.  Patient has attempted to treat this pain with narcotics.  The patient does not have frank gangrene or ulcers at this point.   Atherosclerotic risk factors include: HTN, prior smoker.  Past Medical History:  Diagnosis Date  . Hypertension    Past Surgical History: 1.  Prior B SFA stenting and angioplasty 2.  L SFA PTA complicated by embolism managed endovascularly (see above)  Social History   Socioeconomic History  . Marital status: Married    Spouse name: Not on file  . Number of children: Not on file  . Years of education: Not on file  . Highest education level: Not on file  Social Needs  . Financial resource strain: Not on file  . Food insecurity - worry: Not on file  . Food insecurity - inability: Not on file  . Transportation needs - medical: Not on file  . Transportation needs - non-medical: Not on file  Occupational History  . Not on file  Tobacco Use  . Smoking status: Former Smoker    Last attempt to  quit: 11/10/2016    Years since quitting: 0.0  . Smokeless tobacco: Never Used  Substance and Sexual Activity  . Alcohol use: Not on file  . Drug use: Not on file  . Sexual activity: Not on file  Other Topics Concern  . Not on file  Social History Narrative  . Not on file    Family History: patient is unable to detail the medical history of his parents   Current Outpatient Medications  Medication Sig Dispense Refill  . aspirin 81 MG tablet Take 81 mg by mouth daily.    Marland Kitchen. atenolol (TENORMIN) 50 MG tablet Take 0.5 tablets (25 mg total) by mouth daily.    . clopidogrel (PLAVIX) 75 MG tablet Take 1 tablet (75 mg total) by mouth daily. 30 tablet 6  . losartan-hydrochlorothiazide (HYZAAR) 100-25 MG tablet Take 0.5 tablets by mouth daily.    . nitroGLYCERIN (NITRODUR - DOSED IN MG/24 HR) 0.2 mg/hr patch Place 1 patch (0.2 mg total) onto the skin daily. 30 patch 1  . oxyCODONE-acetaminophen (PERCOCET/ROXICET) 5-325 MG tablet Take 1-2 tablets by mouth every 4 (four) hours as needed for moderate pain or severe pain. 45 tablet 0  . rosuvastatin (CRESTOR) 20 MG tablet Take 20 mg by mouth daily.  1   No current facility-administered medications for this visit.     No Known Allergies  REVIEW OF SYSTEMS (negative unless checked):   Cardiac:  []  Chest pain  or chest pressure? []  Shortness of breath upon activity? []  Shortness of breath when lying flat? []  Irregular heart rhythm?  Vascular:  [x]  Pain in calf, thigh, or hip brought on by walking? [x]  Pain in feet at night that wakes you up from your sleep? [x]  Blood clot in your veins? []  Leg swelling?  Pulmonary:  []  Oxygen at home? []  Productive cough? []  Wheezing?  Neurologic:  []  Sudden weakness in arms or legs? []  Sudden numbness in arms or legs? []  Sudden onset of difficult speaking or slurred speech? []  Temporary loss of vision in one eye? []  Problems with dizziness?  Gastrointestinal:  []  Blood in stool? []  Vomited  blood?  Genitourinary:  []  Burning when urinating? []  Blood in urine?  Psychiatric:  []  Major depression  Hematologic:  []  Bleeding problems? []  Problems with blood clotting?  Dermatologic:  []  Rashes or ulcers?  Constitutional:  []  Fever or chills?  Ear/Nose/Throat:  []  Change in hearing? []  Nose bleeds? []  Sore throat?  Musculoskeletal:  []  Back pain? []  Joint pain? []  Muscle pain?   For VQI Use Only   PRE-ADM LIVING Home  AMB STATUS Ambulatory  CAD Sx None  PRIOR CHF None  STRESS TEST No    Physical Examination     Vitals:   12/11/16 0829  BP: 133/84  Pulse: 62  Resp: 20  Temp: 97.6 F (36.4 C)  TempSrc: Oral  SpO2: 99%  Weight: 131 lb 8 oz (59.6 kg)  Height: 5\' 6"  (1.676 m)   Body mass index is 21.22 kg/m.  General Alert, O x 3, WD, Elderly  Head Hubbard/AT,    Ear/Nose/ Throat Hearing grossly intact, nares without erythema or drainage, oropharynx without Erythema or Exudate, Mallampati score: 3,   Eyes PERRLA, EOMI,    Neck Supple, mid-line trachea,    Pulmonary Sym exp, good B air movt, CTA B  Cardiac RRR, Nl S1, S2, no Murmurs, No rubs, No S3,S4  Vascular Vessel Right Left  Radial Palpable Palpable  Brachial Palpable Palpable  Carotid Palpable, No Bruit Palpable, No Bruit  Aorta Not palpable N/A  Femoral Palpable Palpable  Popliteal Not palpable Not palpable  PT Not palpable Not palpable  DP Not palpable Not palpable    Gastro- intestinal soft, non-distended, non-tender to palpation, No guarding or rebound, no HSM, no masses, no CVAT B, No palpable prominent aortic pulse,    Musculo- skeletal M/S 5/5 throughout  , Extremities without ischemic changes except ischemic appearing L toes, No edema present, No visible varicosities , No Lipodermatosclerosis present  Neurologic Cranial nerves 2-12 intact , Pain and light touch intact in extremities , Motor exam as listed above  Psychiatric Judgement intact, Mood & affect appropriate for pt's  clinical situation  Dermatologic See M/S exam for extremity exam, No rashes otherwise noted  Lymphatic  Palpable lymph nodes: None    Non-Invasive Vascular Imaging   ABI and BLE GSV Mapping ordered   Outside Studies/Documentation   4 pages of outside documents were reviewed including: Dr. Jacinto HalimGanji recent chart notes.   Medical Decision Making   Ryan [name] is a 66 y.o. male who presents with: LLE critical limb ischemia, occluded prior B SFA stents, failed L SFA recannulation complicated by L PFA embolism, no evidence of threatened limb   Pt has numbness with intact motor in L foot.  This has been this patient's clinical exam for the last 2 weeks per the patient, so no need for emergent intervention.  Outside ABI  was not available for review.  This will be repeated along with B GSV mapping for a bypass given the recent endovascular failure.  The question is whether there is residual profunda thromboembolism, which I suspect given the significant worsening in clinical symptoms since Dr. Verl Dicker procedure. Given the limb threatening status of this patient, I recommend an aggressive work up including proceeding with an: L leg runoff. I discussed with the patient the nature of angiographic procedures, especially the limited patencies of any endovascular intervention. The patient is aware of that the risks of an angiographic procedure include but are not limited to: bleeding, infection, access site complications, embolization, rupture of treated vessel, dissection, possible need for emergent surgical intervention, and possible need for surgical procedures to treat the patient's pathology. The patient is aware of the risks and agrees to proceed.  The procedure is scheduled for: 13 NOV 18.  Due to scheduling conflicts, either myself or Dr. Myra Gianotti may be doing the diagnostic study. Tenatively the patient is also scheduled for L CFA to BK pop bypass, possible thrombectomy of L profunda artery with  Dr. Randie Heinz on 15 NOV 18.  I discussed in depth with the patient the nature of atherosclerosis, and emphasized the importance of maximal medical management including strict control of blood pressure, blood glucose, and lipid levels, antiplatelet agents, obtaining regular exercise, and cessation of smoking.  The patient is aware that without maximal medical management the underlying atherosclerotic disease process will progress, limiting the benefit of any interventions. The patient is currently on a statin: Crestor.  The patient is currently on an anti-platelet: ASA and Plavix.  Thank you for allowing [place] to participate in this patient's care.   Leonides Sake, MD, FACS [facility] Office: [phone] Pager: [phone]  12/11/2016, 8:59 AM   Addendum  Non-invasive Vascular Imaging   ABI (12/11/2016)  R:   ABI: 0.46,   PT: mono  DP: mono  TBI:  0.60  L:   ABI: 0,   PT: none  DP: none  TBI: 0  B GSV Mapping (12/11/2016): neither GSV adequate  Leonides Sake, MD, FACS [facility] [place] Office: [phone] Pager: [phone]  12/11/2016, 12:01 PM

## 2016-12-13 ENCOUNTER — Other Ambulatory Visit: Payer: Self-pay

## 2016-12-13 ENCOUNTER — Encounter (HOSPITAL_COMMUNITY)
Admission: RE | Disposition: A | Discharge status: Home / Self Care | Payer: Self-pay | Source: Ambulatory Visit | Attending: Vascular Surgery

## 2016-12-13 ENCOUNTER — Inpatient Hospital Stay (HOSPITAL_COMMUNITY)
Admission: RE | Admit: 2016-12-13 | Discharge: 2016-12-18 | DRG: 253 | Disposition: A | Discharge status: Home / Self Care | Payer: Medicare Other | Source: Ambulatory Visit | Attending: Vascular Surgery | Admitting: Vascular Surgery

## 2016-12-13 ENCOUNTER — Encounter (HOSPITAL_COMMUNITY): Payer: Self-pay | Admitting: General Practice

## 2016-12-13 DIAGNOSIS — I743 Embolism and thrombosis of arteries of the lower extremities: Secondary | ICD-10-CM | POA: Diagnosis present

## 2016-12-13 DIAGNOSIS — I959 Hypotension, unspecified: Secondary | ICD-10-CM | POA: Diagnosis not present

## 2016-12-13 DIAGNOSIS — I70222 Atherosclerosis of native arteries of extremities with rest pain, left leg: Principal | ICD-10-CM | POA: Diagnosis present

## 2016-12-13 DIAGNOSIS — I998 Other disorder of circulatory system: Secondary | ICD-10-CM | POA: Diagnosis not present

## 2016-12-13 DIAGNOSIS — I10 Essential (primary) hypertension: Secondary | ICD-10-CM | POA: Diagnosis present

## 2016-12-13 DIAGNOSIS — Z87891 Personal history of nicotine dependence: Secondary | ICD-10-CM | POA: Diagnosis not present

## 2016-12-13 DIAGNOSIS — Z7902 Long term (current) use of antithrombotics/antiplatelets: Secondary | ICD-10-CM

## 2016-12-13 DIAGNOSIS — Z7982 Long term (current) use of aspirin: Secondary | ICD-10-CM | POA: Diagnosis not present

## 2016-12-13 DIAGNOSIS — I70245 Atherosclerosis of native arteries of left leg with ulceration of other part of foot: Secondary | ICD-10-CM | POA: Diagnosis not present

## 2016-12-13 DIAGNOSIS — I739 Peripheral vascular disease, unspecified: Secondary | ICD-10-CM | POA: Diagnosis not present

## 2016-12-13 DIAGNOSIS — M79605 Pain in left leg: Secondary | ICD-10-CM | POA: Diagnosis not present

## 2016-12-13 HISTORY — PX: ABDOMINAL AORTOGRAM W/LOWER EXTREMITY: CATH118223

## 2016-12-13 HISTORY — DX: Peripheral vascular disease, unspecified: I73.9

## 2016-12-13 HISTORY — PX: ABDOMINAL AORTAGRAM: SHX5706

## 2016-12-13 LAB — CBC
HEMATOCRIT: 39.5 % (ref 39.0–52.0)
HEMOGLOBIN: 12.8 g/dL — AB (ref 13.0–17.0)
MCH: 27.3 pg (ref 26.0–34.0)
MCHC: 32.4 g/dL (ref 30.0–36.0)
MCV: 84.2 fL (ref 78.0–100.0)
Platelets: 322 10*3/uL (ref 150–400)
RBC: 4.69 MIL/uL (ref 4.22–5.81)
RDW: 13.8 % (ref 11.5–15.5)
WBC: 6 10*3/uL (ref 4.0–10.5)

## 2016-12-13 LAB — POCT I-STAT, CHEM 8
BUN: 37 mg/dL — AB (ref 6–20)
CALCIUM ION: 1.27 mmol/L (ref 1.15–1.40)
CHLORIDE: 106 mmol/L (ref 101–111)
Creatinine, Ser: 1.2 mg/dL (ref 0.61–1.24)
Glucose, Bld: 145 mg/dL — ABNORMAL HIGH (ref 65–99)
HCT: 44 % (ref 39.0–52.0)
Hemoglobin: 15 g/dL (ref 13.0–17.0)
Potassium: 4.9 mmol/L (ref 3.5–5.1)
SODIUM: 139 mmol/L (ref 135–145)
TCO2: 23 mmol/L (ref 22–32)

## 2016-12-13 LAB — CREATININE, SERUM
Creatinine, Ser: 1.08 mg/dL (ref 0.61–1.24)
GFR calc Af Amer: >60 mL/min (ref >60)
GFR calc non Af Amer: >60 mL/min (ref >60)

## 2016-12-13 SURGERY — ABDOMINAL AORTOGRAM W/LOWER EXTREMITY
Anesthesia: LOCAL

## 2016-12-13 MED ORDER — HYDROMORPHONE HCL 1 MG/ML IJ SOLN
0.5000 mg | INTRAMUSCULAR | Status: DC | PRN
  Administered 2016-12-14 – 2016-12-15 (×4): 1 mg via INTRAVENOUS
  Filled 2016-12-13 (×4): qty 1

## 2016-12-13 MED ORDER — ATENOLOL 25 MG PO TABS
25.0000 mg | ORAL_TABLET | Freq: Every day | ORAL | Status: DC
  Administered 2016-12-13 – 2016-12-14 (×2): 25 mg via ORAL
  Filled 2016-12-13 (×4): qty 1

## 2016-12-13 MED ORDER — LIDOCAINE HCL (PF) 1 % IJ SOLN
INTRAMUSCULAR | Status: AC
  Filled 2016-12-13: qty 30

## 2016-12-13 MED ORDER — ENSURE ENLIVE PO LIQD: 237.0000 mL | Freq: Two times a day (BID) | ORAL | Status: DC

## 2016-12-13 MED ORDER — HEPARIN (PORCINE) IN NACL 2-0.9 UNIT/ML-% IJ SOLN
INTRAMUSCULAR | Status: AC | PRN
  Administered 2016-12-13: 1000 mL

## 2016-12-13 MED ORDER — FENTANYL CITRATE (PF) 100 MCG/2ML IJ SOLN
INTRAMUSCULAR | Status: DC | PRN
  Administered 2016-12-13: 25 ug via INTRAVENOUS

## 2016-12-13 MED ORDER — CEFAZOLIN SODIUM-DEXTROSE 2-4 GM/100ML-% IV SOLN: 2.0000 g | INTRAVENOUS | Status: DC

## 2016-12-13 MED ORDER — HYDRALAZINE HCL 20 MG/ML IJ SOLN
INTRAMUSCULAR | Status: AC
  Filled 2016-12-13: qty 1

## 2016-12-13 MED ORDER — HYDRALAZINE HCL 20 MG/ML IJ SOLN
5.0000 mg | INTRAMUSCULAR | Status: DC | PRN
  Administered 2016-12-13: 5 mg via INTRAVENOUS

## 2016-12-13 MED ORDER — ROSUVASTATIN CALCIUM 10 MG PO TABS
20.0000 mg | ORAL_TABLET | Freq: Every day | ORAL | Status: DC
  Administered 2016-12-13 – 2016-12-17 (×4): 20 mg via ORAL
  Filled 2016-12-13 (×4): qty 2

## 2016-12-13 MED ORDER — HEPARIN (PORCINE) IN NACL 2-0.9 UNIT/ML-% IJ SOLN
INTRAMUSCULAR | Status: AC
  Filled 2016-12-13: qty 1000

## 2016-12-13 MED ORDER — HEPARIN SODIUM (PORCINE) 5000 UNIT/ML IJ SOLN
5000.0000 [IU] | Freq: Three times a day (TID) | INTRAMUSCULAR | Status: DC
  Administered 2016-12-13 – 2016-12-14 (×2): 5000 [IU] via SUBCUTANEOUS
  Filled 2016-12-13 (×2): qty 1

## 2016-12-13 MED ORDER — ONDANSETRON HCL 4 MG/2ML IJ SOLN: 4.0000 mg | Freq: Four times a day (QID) | INTRAMUSCULAR | Status: DC | PRN

## 2016-12-13 MED ORDER — LOSARTAN POTASSIUM-HCTZ 100-25 MG PO TABS: 0.5000 | ORAL_TABLET | Freq: Every day | ORAL | Status: DC

## 2016-12-13 MED ORDER — FENTANYL CITRATE (PF) 100 MCG/2ML IJ SOLN
INTRAMUSCULAR | Status: AC
  Filled 2016-12-13: qty 2

## 2016-12-13 MED ORDER — OXYCODONE-ACETAMINOPHEN 5-325 MG PO TABS
1.0000 | ORAL_TABLET | ORAL | Status: DC | PRN
  Administered 2016-12-13 – 2016-12-18 (×11): 2 via ORAL
  Filled 2016-12-13 (×11): qty 2

## 2016-12-13 MED ORDER — HYDROCHLOROTHIAZIDE 25 MG PO TABS
25.0000 mg | ORAL_TABLET | Freq: Every day | ORAL | Status: DC
  Administered 2016-12-16: 25 mg via ORAL
  Filled 2016-12-13 (×3): qty 1

## 2016-12-13 MED ORDER — ASPIRIN EC 81 MG PO TBEC
81.0000 mg | DELAYED_RELEASE_TABLET | Freq: Every day | ORAL | Status: DC
  Administered 2016-12-13 – 2016-12-18 (×5): 81 mg via ORAL
  Filled 2016-12-13 (×5): qty 1

## 2016-12-13 MED ORDER — SODIUM CHLORIDE 0.9% FLUSH
3.0000 mL | Freq: Two times a day (BID) | INTRAVENOUS | Status: DC
  Administered 2016-12-13 – 2016-12-18 (×9): 3 mL via INTRAVENOUS

## 2016-12-13 MED ORDER — SODIUM CHLORIDE 0.9% FLUSH: 3.0000 mL | INTRAVENOUS | Status: DC | PRN

## 2016-12-13 MED ORDER — SODIUM CHLORIDE 0.9 % WEIGHT BASED INFUSION
1.0000 mL/kg/h | INTRAVENOUS | Status: AC
  Administered 2016-12-13: 1 mL/kg/h via INTRAVENOUS

## 2016-12-13 MED ORDER — MIDAZOLAM HCL 2 MG/2ML IJ SOLN
INTRAMUSCULAR | Status: AC
  Filled 2016-12-13: qty 2

## 2016-12-13 MED ORDER — ACETAMINOPHEN 325 MG PO TABS: 650.0000 mg | ORAL_TABLET | ORAL | Status: DC | PRN

## 2016-12-13 MED ORDER — LIDOCAINE HCL (PF) 1 % IJ SOLN
INTRAMUSCULAR | Status: DC | PRN
  Administered 2016-12-13: 15 mL

## 2016-12-13 MED ORDER — LABETALOL HCL 5 MG/ML IV SOLN: 10.0000 mg | INTRAVENOUS | Status: DC | PRN

## 2016-12-13 MED ORDER — SODIUM CHLORIDE 0.9 % IV SOLN
250.0000 mL | INTRAVENOUS | Status: DC | PRN
  Administered 2016-12-14: 16:00:00 via INTRAVENOUS

## 2016-12-13 MED ORDER — SODIUM CHLORIDE 0.9 % IV SOLN
INTRAVENOUS | Status: DC
  Administered 2016-12-13: 10:00:00 via INTRAVENOUS

## 2016-12-13 MED ORDER — MIDAZOLAM HCL 2 MG/2ML IJ SOLN
INTRAMUSCULAR | Status: DC | PRN
Start: 2016-12-13 — End: 2016-12-13
  Administered 2016-12-13: 1 mg via INTRAVENOUS

## 2016-12-13 MED ORDER — NITROGLYCERIN 0.2 MG/HR TD PT24
0.2000 mg | MEDICATED_PATCH | Freq: Every day | TRANSDERMAL | Status: DC
  Administered 2016-12-15 – 2016-12-17 (×3): 0.2 mg via TRANSDERMAL
  Filled 2016-12-13 (×5): qty 1

## 2016-12-13 MED ORDER — CLOPIDOGREL BISULFATE 75 MG PO TABS
75.0000 mg | ORAL_TABLET | Freq: Every day | ORAL | Status: DC
Start: 2016-12-13 — End: 2016-12-18
  Administered 2016-12-13 – 2016-12-17 (×4): 75 mg via ORAL
  Filled 2016-12-13 (×4): qty 1

## 2016-12-13 MED ORDER — IODIXANOL 320 MG/ML IV SOLN
INTRAVENOUS | Status: DC | PRN
  Administered 2016-12-13: 55 mL via INTRA_ARTERIAL

## 2016-12-13 MED ORDER — LOSARTAN POTASSIUM 50 MG PO TABS
100.0000 mg | ORAL_TABLET | Freq: Every day | ORAL | Status: DC
  Administered 2016-12-16: 100 mg via ORAL
  Filled 2016-12-13 (×3): qty 2

## 2016-12-13 SURGICAL SUPPLY — 10 items
CATH OMNI FLUSH 5F 65CM (CATHETERS) ×2 IMPLANT
COVER PRB 48X5XTLSCP FOLD TPE (BAG) ×1 IMPLANT
COVER PROBE 5X48 (BAG) ×1
KIT MICROINTRODUCER STIFF 5F (SHEATH) ×2 IMPLANT
KIT PV (KITS) ×2 IMPLANT
SHEATH PINNACLE 5F 10CM (SHEATH) ×2 IMPLANT
SYR MEDRAD MARK V 150ML (SYRINGE) ×2 IMPLANT
TRANSDUCER W/STOPCOCK (MISCELLANEOUS) ×2 IMPLANT
TRAY PV CATH (CUSTOM PROCEDURE TRAY) ×2 IMPLANT
WIRE BENTSON .035X145CM (WIRE) ×2 IMPLANT

## 2016-12-13 NOTE — Op Note (Signed)
    Patient name: Ryan MedicusLevon Hammersmith MRN: [id number] DOB: 09/16/1950 Sex: male  12/13/2016 Pre-operative Diagnosis: critical left lower extremity ischemia Post-operative diagnosis:  Same Surgeon:  Apolinar JunesBrandon C. [name], MD Procedure Performed: 1.  US guided cannulation of right common femoral artery 2.  Aortogram with left lower extremity runoff 3.  Moderate sedation with fentanyl and versed for 14 minutes  Indications: 66 year old male who has history of left SFA stenting.  Recently had procedure complicated by embolization to his profunda femoris artery and now has rest pain in the left lower extremity.  He is indicated for angiogram for preop bypass planning.  Findings: The aorta and iliac segments are free of flow-limiting disease.  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  The left SFA is stented and they are all occluded.  The profunda femoris artery has 2 branches that are occluded shortly after the takeoff.  He reconstitutes a popliteal artery just above the knee that is quite diminutive and appears to have two-vessel runoff via the peroneal and posterior tibial arteries to at least the mid calf there is some sign of flow to the ankle.  There is no discernible anterior tibial artery on the left.   Procedure:  The patient was identified in the holding area and taken to room 8.  The patient was then placed supine on the table and prepped and draped in the usual sterile fashion.  A time out was called.  Ultrasound was used to evaluate the right common femoral artery.  It was patent .  A digital ultrasound image was acquired.  A micropuncture needle was used to access the right common femoral artery under ultrasound guidance.  An 018 wire was advanced without resistance and a micropuncture sheath was placed.  The 018 wire was removed and a benson wire was placed.  The micropuncture sheath was exchanged for a 5 french sheath.  An omniflush catheter was advanced  over the wire to the level of L-1.  An abdominal angiogram was obtained.  Next, using the omniflush catheter and a benson wire, the aortic bifurcation was crossed and the catheter was placed into theleft external iliac artery and left runoff was obtained.  With the above findings he will be planned for left femoral to below-knee popliteal artery bypass.  Catheter was removed over wire.  Sheath will be pulled in postoperative holding.  The patient tolerated procedure well without immediate complication.   Contrast: 55cc  Aland Chestnutt C. [name], MD [facility] [place] Office: [phone] Pager: [phone]

## 2016-12-13 NOTE — H&P (Signed)
   History and Physical Update  The patient was interviewed and re-examined.  The patient's previous History and Physical has been reviewed and is unchanged from Dr. Imogene Burnhen office visit. Plan for angiogram and evaluation of left lower extremity today with plan for bypass tomorrow.    Brandon C. [name], MD [facility] [place] Office: [phone] Pager: [phone]  12/13/2016, 11:57 AM

## 2016-12-13 NOTE — Progress Notes (Signed)
15fr arterial sheath aspirated and pulled from rt groin.  Manual pressure held for 20mins, hemostasis achieved.  Site level 0.  Tegaderm and gauze dressing applied.  Instructions given to pt, pt verbalizes understanding.     Bedrest begins at 14:10

## 2016-12-14 ENCOUNTER — Inpatient Hospital Stay (HOSPITAL_COMMUNITY): Admission: RE | Admit: 2016-12-14 | Payer: Medicare Other | Source: Ambulatory Visit | Admitting: Vascular Surgery

## 2016-12-14 ENCOUNTER — Inpatient Hospital Stay (HOSPITAL_COMMUNITY): Payer: Medicare Other

## 2016-12-14 ENCOUNTER — Encounter (HOSPITAL_COMMUNITY)
Admission: RE | Disposition: A | Discharge status: Home / Self Care | Payer: Self-pay | Source: Ambulatory Visit | Attending: Vascular Surgery

## 2016-12-14 ENCOUNTER — Encounter (HOSPITAL_COMMUNITY): Payer: Self-pay | Admitting: Vascular Surgery

## 2016-12-14 DIAGNOSIS — I743 Embolism and thrombosis of arteries of the lower extremities: Secondary | ICD-10-CM

## 2016-12-14 HISTORY — PX: THROMBECTOMY FEMORAL ARTERY: SHX6406

## 2016-12-14 HISTORY — PX: FEMORAL-POPLITEAL BYPASS GRAFT: SHX937

## 2016-12-14 LAB — BASIC METABOLIC PANEL
ANION GAP: 7 (ref 5–15)
BUN: 26 mg/dL — ABNORMAL HIGH (ref 6–20)
CHLORIDE: 105 mmol/L (ref 101–111)
CO2: 22 mmol/L (ref 22–32)
Calcium: 9.2 mg/dL (ref 8.9–10.3)
Creatinine, Ser: 1.21 mg/dL (ref 0.61–1.24)
GFR calc non Af Amer: 60 mL/min — ABNORMAL LOW (ref >60)
Glucose, Bld: 125 mg/dL — ABNORMAL HIGH (ref 65–99)
POTASSIUM: 5.1 mmol/L (ref 3.5–5.1)
Sodium: 134 mmol/L — ABNORMAL LOW (ref 135–145)

## 2016-12-14 LAB — CBC
HCT: 37.8 % — ABNORMAL LOW (ref 39.0–52.0)
HEMOGLOBIN: 12.2 g/dL — AB (ref 13.0–17.0)
MCH: 27 pg (ref 26.0–34.0)
MCHC: 32.3 g/dL (ref 30.0–36.0)
MCV: 83.6 fL (ref 78.0–100.0)
Platelets: 288 10*3/uL (ref 150–400)
RBC: 4.52 MIL/uL (ref 4.22–5.81)
RDW: 13.8 % (ref 11.5–15.5)
WBC: 6 10*3/uL (ref 4.0–10.5)

## 2016-12-14 LAB — SURGICAL PCR SCREEN
MRSA, PCR: NEGATIVE
STAPHYLOCOCCUS AUREUS: NEGATIVE

## 2016-12-14 SURGERY — BYPASS GRAFT FEMORAL-POPLITEAL ARTERY
Anesthesia: General | Site: Leg Lower | Laterality: Left

## 2016-12-14 MED ORDER — PHENOL 1.4 % MT LIQD: 1.0000 | OROMUCOSAL | Status: DC | PRN

## 2016-12-14 MED ORDER — FENTANYL CITRATE (PF) 250 MCG/5ML IJ SOLN
INTRAMUSCULAR | Status: AC
  Filled 2016-12-14: qty 5

## 2016-12-14 MED ORDER — POTASSIUM CHLORIDE CRYS ER 20 MEQ PO TBCR: 20.0000 meq | EXTENDED_RELEASE_TABLET | Freq: Every day | ORAL | Status: DC | PRN

## 2016-12-14 MED ORDER — SUGAMMADEX SODIUM 200 MG/2ML IV SOLN
INTRAVENOUS | Status: DC | PRN
  Administered 2016-12-14: 122.4 mg via INTRAVENOUS

## 2016-12-14 MED ORDER — LIDOCAINE 2% (20 MG/ML) 5 ML SYRINGE
INTRAMUSCULAR | Status: AC
  Filled 2016-12-14: qty 5

## 2016-12-14 MED ORDER — PHENYLEPHRINE 40 MCG/ML (10ML) SYRINGE FOR IV PUSH (FOR BLOOD PRESSURE SUPPORT)
PREFILLED_SYRINGE | INTRAVENOUS | Status: DC | PRN
  Administered 2016-12-14: 40 ug via INTRAVENOUS
  Administered 2016-12-14: 160 ug via INTRAVENOUS
  Administered 2016-12-14: 80 ug via INTRAVENOUS

## 2016-12-14 MED ORDER — OXYCODONE HCL 5 MG/5ML PO SOLN: 5.0000 mg | Freq: Once | ORAL | Status: DC | PRN

## 2016-12-14 MED ORDER — SODIUM CHLORIDE 0.9 % IV SOLN
INTRAVENOUS | Status: DC
  Administered 2016-12-14 – 2016-12-15 (×2): via INTRAVENOUS

## 2016-12-14 MED ORDER — 0.9 % SODIUM CHLORIDE (POUR BTL) OPTIME
TOPICAL | Status: DC | PRN
  Administered 2016-12-14: 2000 mL

## 2016-12-14 MED ORDER — HEMOSTATIC AGENTS (NO CHARGE) OPTIME
TOPICAL | Status: DC | PRN
  Administered 2016-12-14: 1 via TOPICAL

## 2016-12-14 MED ORDER — PHENYLEPHRINE HCL 10 MG/ML IJ SOLN
INTRAVENOUS | Status: DC | PRN
  Administered 2016-12-14: 40 ug/min via INTRAVENOUS

## 2016-12-14 MED ORDER — LACTATED RINGERS IV SOLN
INTRAVENOUS | Status: DC
  Administered 2016-12-14: 12:00:00 via INTRAVENOUS

## 2016-12-14 MED ORDER — ROCURONIUM BROMIDE 100 MG/10ML IV SOLN
INTRAVENOUS | Status: DC | PRN
Start: 2016-12-14 — End: 2016-12-14
  Administered 2016-12-14 (×2): 50 mg via INTRAVENOUS
  Administered 2016-12-14: 10 mg via INTRAVENOUS
  Administered 2016-12-14: 50 mg via INTRAVENOUS

## 2016-12-14 MED ORDER — METOPROLOL TARTRATE 5 MG/5ML IV SOLN: 2.0000 mg | INTRAVENOUS | Status: DC | PRN

## 2016-12-14 MED ORDER — SODIUM CHLORIDE 0.9 % IV SOLN
INTRAVENOUS | Status: DC | PRN
  Administered 2016-12-14: 15:00:00

## 2016-12-14 MED ORDER — ENOXAPARIN SODIUM 40 MG/0.4ML ~~LOC~~ SOLN
40.0000 mg | SUBCUTANEOUS | Status: DC
  Administered 2016-12-15 – 2016-12-18 (×4): 40 mg via SUBCUTANEOUS
  Filled 2016-12-14 (×4): qty 0.4

## 2016-12-14 MED ORDER — CEFAZOLIN SODIUM-DEXTROSE 2-3 GM-%(50ML) IV SOLR
INTRAVENOUS | Status: DC | PRN
  Administered 2016-12-14: 2 g via INTRAVENOUS

## 2016-12-14 MED ORDER — PANTOPRAZOLE SODIUM 40 MG PO TBEC
40.0000 mg | DELAYED_RELEASE_TABLET | Freq: Every day | ORAL | Status: DC
  Administered 2016-12-15 – 2016-12-18 (×4): 40 mg via ORAL
  Filled 2016-12-14 (×4): qty 1

## 2016-12-14 MED ORDER — BISACODYL 10 MG RE SUPP: 10.0000 mg | Freq: Every day | RECTAL | Status: DC | PRN

## 2016-12-14 MED ORDER — HEPARIN SODIUM (PORCINE) 1000 UNIT/ML IJ SOLN
INTRAMUSCULAR | Status: DC | PRN
  Administered 2016-12-14: 7000 [IU] via INTRAVENOUS

## 2016-12-14 MED ORDER — LIDOCAINE HCL (CARDIAC) 20 MG/ML IV SOLN
INTRAVENOUS | Status: DC | PRN
  Administered 2016-12-14: 80 mg via INTRAVENOUS

## 2016-12-14 MED ORDER — DEXAMETHASONE SODIUM PHOSPHATE 10 MG/ML IJ SOLN
INTRAMUSCULAR | Status: AC
  Filled 2016-12-14: qty 1

## 2016-12-14 MED ORDER — ONDANSETRON HCL 4 MG/2ML IJ SOLN
INTRAMUSCULAR | Status: AC
  Filled 2016-12-14: qty 2

## 2016-12-14 MED ORDER — ALUM & MAG HYDROXIDE-SIMETH 200-200-20 MG/5ML PO SUSP: 15.0000 mL | ORAL | Status: DC | PRN

## 2016-12-14 MED ORDER — ALBUMIN HUMAN 5 % IV SOLN
INTRAVENOUS | Status: DC | PRN
  Administered 2016-12-14 (×2): via INTRAVENOUS

## 2016-12-14 MED ORDER — FENTANYL CITRATE (PF) 100 MCG/2ML IJ SOLN
INTRAMUSCULAR | Status: DC | PRN
  Administered 2016-12-14 (×5): 50 ug via INTRAVENOUS

## 2016-12-14 MED ORDER — ROCURONIUM BROMIDE 10 MG/ML (PF) SYRINGE
PREFILLED_SYRINGE | INTRAVENOUS | Status: AC
  Filled 2016-12-14: qty 5

## 2016-12-14 MED ORDER — DOCUSATE SODIUM 100 MG PO CAPS
100.0000 mg | ORAL_CAPSULE | Freq: Every day | ORAL | Status: DC
  Administered 2016-12-15 – 2016-12-18 (×4): 100 mg via ORAL
  Filled 2016-12-14 (×4): qty 1

## 2016-12-14 MED ORDER — PROPOFOL 10 MG/ML IV BOLUS
INTRAVENOUS | Status: DC | PRN
  Administered 2016-12-14: 120 mg via INTRAVENOUS

## 2016-12-14 MED ORDER — PROPOFOL 10 MG/ML IV BOLUS
INTRAVENOUS | Status: AC
  Filled 2016-12-14: qty 20

## 2016-12-14 MED ORDER — OXYCODONE HCL 5 MG PO TABS: 5.0000 mg | ORAL_TABLET | Freq: Once | ORAL | Status: DC | PRN

## 2016-12-14 MED ORDER — HYDROMORPHONE HCL 1 MG/ML IJ SOLN: 0.2500 mg | INTRAMUSCULAR | Status: DC | PRN

## 2016-12-14 MED ORDER — GUAIFENESIN-DM 100-10 MG/5ML PO SYRP
15.0000 mL | ORAL_SOLUTION | ORAL | Status: DC | PRN
Start: 2016-12-14 — End: 2016-12-18

## 2016-12-14 MED ORDER — MIDAZOLAM HCL 2 MG/2ML IJ SOLN
INTRAMUSCULAR | Status: AC
  Filled 2016-12-14: qty 2

## 2016-12-14 MED ORDER — SODIUM CHLORIDE 0.9 % IV SOLN: 500.0000 mL | Freq: Once | INTRAVENOUS | Status: DC | PRN

## 2016-12-14 MED ORDER — IODIXANOL 320 MG/ML IV SOLN
INTRAVENOUS | Status: DC | PRN
  Administered 2016-12-14: 20 mL via INTRAVENOUS

## 2016-12-14 MED ORDER — ONDANSETRON HCL 4 MG/2ML IJ SOLN
INTRAMUSCULAR | Status: DC | PRN
  Administered 2016-12-14: 4 mg via INTRAVENOUS

## 2016-12-14 MED ORDER — HYDROCODONE-ACETAMINOPHEN 5-325 MG PO TABS
1.0000 | ORAL_TABLET | ORAL | Status: DC | PRN
  Administered 2016-12-15 – 2016-12-17 (×8): 1 via ORAL
  Filled 2016-12-14 (×8): qty 1

## 2016-12-14 MED ORDER — POLYETHYLENE GLYCOL 3350 17 G PO PACK
17.0000 g | PACK | Freq: Every day | ORAL | Status: DC | PRN
  Administered 2016-12-15 – 2016-12-16 (×2): 17 g via ORAL
  Filled 2016-12-14 (×2): qty 1

## 2016-12-14 MED ORDER — MAGNESIUM SULFATE 2 GM/50ML IV SOLN
2.0000 g | Freq: Every day | INTRAVENOUS | Status: DC | PRN
  Filled 2016-12-14: qty 50

## 2016-12-14 MED ORDER — PROTAMINE SULFATE 10 MG/ML IV SOLN
INTRAVENOUS | Status: DC | PRN
  Administered 2016-12-14: 40 mg via INTRAVENOUS
  Administered 2016-12-14: 10 mg via INTRAVENOUS

## 2016-12-14 MED ORDER — MUPIROCIN 2 % EX OINT
1.0000 "application " | TOPICAL_OINTMENT | Freq: Once | CUTANEOUS | Status: AC
  Administered 2016-12-14: 1 via TOPICAL
  Filled 2016-12-14: qty 22

## 2016-12-14 MED ORDER — DEXAMETHASONE SODIUM PHOSPHATE 10 MG/ML IJ SOLN
INTRAMUSCULAR | Status: DC | PRN
  Administered 2016-12-14: 5 mg via INTRAVENOUS

## 2016-12-14 MED ORDER — DEXTROSE 5 % IV SOLN
1.5000 g | Freq: Two times a day (BID) | INTRAVENOUS | Status: AC
  Administered 2016-12-14 – 2016-12-15 (×2): 1.5 g via INTRAVENOUS
  Filled 2016-12-14 (×2): qty 1.5

## 2016-12-14 SURGICAL SUPPLY — 69 items
BANDAGE ACE 4X5 VEL STRL LF (GAUZE/BANDAGES/DRESSINGS) IMPLANT
BANDAGE ESMARK 6X9 LF (GAUZE/BANDAGES/DRESSINGS) IMPLANT
BNDG ESMARK 6X9 LF (GAUZE/BANDAGES/DRESSINGS)
CANISTER SUCT 3000ML PPV (MISCELLANEOUS) ×3 IMPLANT
CATH EMB 2FR 60CM (CATHETERS) ×3 IMPLANT
CATH EMB 4FR 80CM (CATHETERS) ×3 IMPLANT
CLIP VESOCCLUDE MED 24/CT (CLIP) ×3 IMPLANT
CLIP VESOCCLUDE SM WIDE 24/CT (CLIP) ×3 IMPLANT
COVER PROBE W GEL 5X96 (DRAPES) ×3 IMPLANT
CUFF TOURNIQUET SINGLE 24IN (TOURNIQUET CUFF) IMPLANT
CUFF TOURNIQUET SINGLE 34IN LL (TOURNIQUET CUFF) IMPLANT
CUFF TOURNIQUET SINGLE 44IN (TOURNIQUET CUFF) IMPLANT
DERMABOND ADHESIVE PROPEN (GAUZE/BANDAGES/DRESSINGS) ×2
DERMABOND ADVANCED (GAUZE/BANDAGES/DRESSINGS) ×1
DERMABOND ADVANCED .7 DNX12 (GAUZE/BANDAGES/DRESSINGS) ×2 IMPLANT
DERMABOND ADVANCED .7 DNX6 (GAUZE/BANDAGES/DRESSINGS) ×4 IMPLANT
DRAIN CHANNEL 15F RND FF W/TCR (WOUND CARE) IMPLANT
DRAPE C-ARM 35X43 STRL (DRAPES) ×3 IMPLANT
DRAPE C-ARM 42X72 X-RAY (DRAPES) ×3 IMPLANT
DRAPE C-ARMOR (DRAPES) ×3 IMPLANT
DRAPE HALF SHEET 40X57 (DRAPES) IMPLANT
ELECT REM PT RETURN 9FT ADLT (ELECTROSURGICAL) ×3
ELECTRODE REM PT RTRN 9FT ADLT (ELECTROSURGICAL) ×2 IMPLANT
EVACUATOR SILICONE 100CC (DRAIN) IMPLANT
GLOVE BIO SURGEON STRL SZ 6.5 (GLOVE) ×6 IMPLANT
GLOVE BIO SURGEON STRL SZ7 (GLOVE) ×12 IMPLANT
GLOVE BIO SURGEON STRL SZ7.5 (GLOVE) ×3 IMPLANT
GLOVE BIOGEL PI IND STRL 6.5 (GLOVE) ×6 IMPLANT
GLOVE BIOGEL PI IND STRL 7.0 (GLOVE) ×6 IMPLANT
GLOVE BIOGEL PI IND STRL 7.5 (GLOVE) ×4 IMPLANT
GLOVE BIOGEL PI INDICATOR 6.5 (GLOVE) ×3
GLOVE BIOGEL PI INDICATOR 7.0 (GLOVE) ×3
GLOVE BIOGEL PI INDICATOR 7.5 (GLOVE) ×2
GLOVE INDICATOR 7.5 STRL GRN (GLOVE) ×3 IMPLANT
GOWN STRL REUS W/ TWL LRG LVL3 (GOWN DISPOSABLE) ×8 IMPLANT
GOWN STRL REUS W/TWL LRG LVL3 (GOWN DISPOSABLE) ×4
GRAFT PROPATEN THIN WALL 6X80 (Vascular Products) ×3 IMPLANT
HEMOSTAT SPONGE AVITENE ULTRA (HEMOSTASIS) ×3 IMPLANT
INSERT FOGARTY SM (MISCELLANEOUS) IMPLANT
KIT BASIN OR (CUSTOM PROCEDURE TRAY) ×3 IMPLANT
KIT ROOM TURNOVER OR (KITS) ×3 IMPLANT
LOOP VESSEL MINI RED (MISCELLANEOUS) ×3 IMPLANT
MARKER GRAFT CORONARY BYPASS (MISCELLANEOUS) IMPLANT
NS IRRIG 1000ML POUR BTL (IV SOLUTION) ×6 IMPLANT
PACK PERIPHERAL VASCULAR (CUSTOM PROCEDURE TRAY) ×3 IMPLANT
PAD ARMBOARD 7.5X6 YLW CONV (MISCELLANEOUS) ×6 IMPLANT
PATCH VASC XENOSURE 1CMX6CM (Vascular Products) ×1 IMPLANT
PATCH VASC XENOSURE 1X6 (Vascular Products) ×2 IMPLANT
SET MICROPUNCTURE 5F STIFF (MISCELLANEOUS) ×3 IMPLANT
STAPLER VISISTAT 35W (STAPLE) IMPLANT
STOPCOCK 4 WAY LG BORE MALE ST (IV SETS) ×3 IMPLANT
SUT ETHILON 3 0 PS 1 (SUTURE) IMPLANT
SUT GORETEX 5 0 TT13 24 (SUTURE) ×6 IMPLANT
SUT GORETEX 6.0 TT13 (SUTURE) IMPLANT
SUT MNCRL AB 4-0 PS2 18 (SUTURE) ×9 IMPLANT
SUT PROLENE 5 0 C 1 24 (SUTURE) ×3 IMPLANT
SUT PROLENE 6 0 BV (SUTURE) ×15 IMPLANT
SUT PROLENE 7 0 BV 1 (SUTURE) IMPLANT
SUT SILK 2 0 PERMA HAND 18 BK (SUTURE) IMPLANT
SUT SILK 3 0 (SUTURE)
SUT SILK 3-0 18XBRD TIE 12 (SUTURE) IMPLANT
SUT VIC AB 2-0 CT1 27 (SUTURE) ×2
SUT VIC AB 2-0 CT1 TAPERPNT 27 (SUTURE) ×4 IMPLANT
SUT VIC AB 3-0 SH 27 (SUTURE) ×4
SUT VIC AB 3-0 SH 27X BRD (SUTURE) ×8 IMPLANT
TRAY FOLEY W/METER SILVER 16FR (SET/KITS/TRAYS/PACK) ×3 IMPLANT
TUBING EXTENTION W/L.L. (IV SETS) ×3 IMPLANT
UNDERPAD 30X30 (UNDERPADS AND DIAPERS) ×3 IMPLANT
WATER STERILE IRR 1000ML POUR (IV SOLUTION) ×3 IMPLANT

## 2016-12-14 NOTE — Progress Notes (Signed)
  Progress Note    12/14/2016 10:43 AM 1 Day Post-Op  Subjective: having left foot pain  Vitals:   12/13/16 2009 12/14/16 0442  BP: 117/70   Pulse: 69   Resp: 19   Temp: 98.3 F (36.8 C) 98.3 F (36.8 C)  SpO2: 97%     Physical Exam: aaox3 Non labored respirations Left foot is cool Right groin is soft  CBC    Component Value Date/Time   WBC 6.0 12/14/2016 0523   RBC 4.52 12/14/2016 0523   HGB 12.2 (L) 12/14/2016 0523   HCT 37.8 (L) 12/14/2016 0523   PLT 288 12/14/2016 0523   MCV 83.6 12/14/2016 0523   MCH 27.0 12/14/2016 0523   MCHC 32.3 12/14/2016 0523   RDW 13.8 12/14/2016 0523    BMET    Component Value Date/Time   NA 134 (L) 12/14/2016 0523   K 5.1 12/14/2016 0523   CL 105 12/14/2016 0523   CO2 22 12/14/2016 0523   GLUCOSE 125 (H) 12/14/2016 0523   BUN 26 (H) 12/14/2016 0523   CREATININE 1.21 12/14/2016 0523   CALCIUM 9.2 12/14/2016 0523   GFRNONAA 60 (L) 12/14/2016 0523   GFRAA >60 12/14/2016 0523    INR    Component Value Date/Time   INR 0.9 06/16/2008 1010     Intake/Output Summary (Last 24 hours) at 12/14/2016 1043 Last data filed at 12/14/2016 0900 Gross per 24 hour  Intake 92.82 ml  Output 650 ml  Net -557.18 ml     Assessment:  66 y.o. male is s/p diagnostic lle angiogram Plan: Left fem-pop bypass with graft today with myself or Dr. Imogene Burnhen.    Ryan [name] C. [name], MD [facility] [place] Office: [phone] Pager: [phone]  12/14/2016 10:43 AM

## 2016-12-14 NOTE — Anesthesia Procedure Notes (Signed)
Procedure Name: Intubation Date/Time: 12/14/2016 2:24 PM Performed by: Army FossaPulliam, Zorah Backes Dane, CRNA Pre-anesthesia Checklist: Patient identified, Emergency Drugs available, Suction available and Patient being monitored Patient Re-evaluated:Patient Re-evaluated prior to induction Oxygen Delivery Method: Circle System Utilized Preoxygenation: Pre-oxygenation with 100% oxygen Induction Type: IV induction Ventilation: Mask ventilation without difficulty and Oral airway inserted - appropriate to patient size Laryngoscope Size: Hyacinth MeekerMiller and 2 Grade View: Grade I Tube type: Oral Tube size: 7.5 mm Number of attempts: 1 Airway Equipment and Method: Stylet and Oral airway Placement Confirmation: ETT inserted through vocal cords under direct vision,  positive ETCO2 and breath sounds checked- equal and bilateral Tube secured with: Tape Dental Injury: Teeth and Oropharynx as per pre-operative assessment

## 2016-12-14 NOTE — Interval H&P Note (Signed)
   History and Physical Update  The patient was interviewed and re-examined.  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  These collaterals are clearly inadequate to maintain viability of this patient left foot.  I recommend: left common femoral artery to below-the-knee popliteal artery bypass, profunda femoral artery thrombectomy.     The risk, benefits, and alternative for bypass operations were discussed with the patient.    The patient is aware the risks include but are not limited to: bleeding, infection, myocardial infarction, stroke, limb loss, nerve damage, limb edema, need for additional procedures in the future, wound complications, and inability to complete the bypass.   The patient is aware of these risks and agreed to proceed.  Leonides SakeBrian Marissa Lowrey, MD, FACS [facility] [place] Office: [phone] Pager: [phone]  12/14/2016, 2:23 PM

## 2016-12-14 NOTE — Plan of Care (Signed)
Patient can get out on bed with minimal assist.

## 2016-12-14 NOTE — Anesthesia Preprocedure Evaluation (Addendum)
Anesthesia Evaluation  Patient identified by MRN, date of birth, ID band Patient awake    Reviewed: Allergy & Precautions, NPO status , Patient's Chart, lab work & pertinent test results  Airway Mallampati: II  TM Distance: >3 FB Neck ROM: Full    Dental  (+) Edentulous Upper   Pulmonary neg pulmonary ROS, former smoker,    breath sounds clear to auscultation       Cardiovascular hypertension, + Peripheral Vascular Disease   Rhythm:Regular Rate:Normal     Neuro/Psych negative neurological ROS     GI/Hepatic negative GI ROS, Neg liver ROS,   Endo/Other  negative endocrine ROS  Renal/GU negative Renal ROS     Musculoskeletal   Abdominal   Peds  Hematology negative hematology ROS (+)   Anesthesia Other Findings   Reproductive/Obstetrics                            Anesthesia Physical Anesthesia Plan  ASA: II  Anesthesia Plan: General   Post-op Pain Management:    Induction: Intravenous  PONV Risk Score and Plan: 3 and Ondansetron, Dexamethasone and Treatment may vary due to age or medical condition  Airway Management Planned: Oral ETT  Additional Equipment:   Intra-op Plan:   Post-operative Plan: Extubation in OR  Informed Consent: I have reviewed the patients History and Physical, chart, labs and discussed the procedure including the risks, benefits and alternatives for the proposed anesthesia with the patient or authorized representative who has indicated his/her understanding and acceptance.   Dental advisory given  Plan Discussed with: CRNA  Anesthesia Plan Comments:         Anesthesia Quick Evaluation

## 2016-12-14 NOTE — Plan of Care (Signed)
Pain is tolerable

## 2016-12-14 NOTE — Op Note (Signed)
OPERATIVE NOTE   PROCEDURE: 1.  Left common femoral artery to below-the-knee popliteal artery bypass with Propaten 2.  Linton patch left below-the-knee popliteal artery  3.  Embolectomy left profunda femoral arteries 4.  Open cannulation of left common femoral artery 5.  Left leg angiogram  PRE-OPERATIVE DIAGNOSIS: left foot ischemia, embolism left profunda femoral artery   POST-OPERATIVE DIAGNOSIS: same as above   SURGEON: Leonides Sake, MD  ASSISTANT(S): Cammy Brochure, PAC  ANESTHESIA: general  ESTIMATED BLOOD LOSS: 150 cc  FINDING(S): 1.  Organized thrombus retrieved from profunda femoral artery  2.  Dopplerable posterior tibial artery at end of case 3.  Left leg runoff via posterior tibial artery and peroneal artery  SPECIMEN(S):  none  INDICATIONS:   Ryan [name] is a 66 y.o. male who presents with left foot rest pain after complication from a recent cardiology procedure.  The patient underwent angiography recently which demonstrates persistent profunda femoral artery occlusion and minimal collateral blood flow feeding a popliteal artery.  I recommended: left common femoral artery to below-the-knee popliteal artery bypass, possible profunda femoral artery embolectomy.  The risk, benefits, and alternative for bypass operations were discussed with the patient.  The patient is aware the risks include but are not limited to: bleeding, infection, myocardial infarction, stroke, limb loss, nerve damage, limb edema, need for additional procedures in the future, wound complications, and inability to complete the bypass.  The patient is aware of these risks and agreed to proceed.  DESCRIPTION: After full informed written consent was obtained, the patient was brought back to the operating room and placed supine upon the operating table.  Prior to induction, the patient was given intravenous antibiotics.  After obtaining adequate anesthesia, the patient was prepped and draped in the  standard fashion for a femoral to popliteal bypass operation.  Attention was turned to the right groin.  A longitudinal incision was made over the right common femoral artery.  Using blunt dissection and electrocautery, the artery was dissected out from the inguinal ligament down to the femoral bifurcation.  The superficial femoral artery, two profunda femoral arteries, and external iliac artery were dissected out and vessel loops applied.  The femoral bifurcation was high with two profunda femoral arteries taking off 4 cm distal to the inguinal ligament.  I dissected out the lateral profunda branch down to the first bifurcation.  This common femoral artery was found on exam to be partially calcified.    At this point, attention was turned to the calf.  An longitudinal incision was made one finger-width posterior to the tibia.  Using blunt dissection and electrocautery, a plane was developed through the subcutaneous tissue and fascia down to the popliteal space.  The popliteal vein was dissected out and retracted medially and posteriorly.  The below-the-knee popliteal artery was dissected away from lateral popliteal vein.  This popliteal artery was found on exam to be diseased without calcification.  The below-the-knee popliteal artery appeared to be only 2.5-3.0 mm in diameter.  I placed vessels loops proximally and distally on the below-the-knee popliteal artery.  I elected to explore this artery.  The patient was given 6000 units of Heparin intravenously, which was a therapeutic bolus. An additional 2000 units of Heparin was administered every hour after initial bolus to maintain anticoagulation.  In total, 8000 units of Heparin was administrated to achieve and maintain a therapeutic level of anticoagulation.  After waiting 3 minutes, I placed the below-the-knee popliteal artery under tension proximally and distally.  I made an incision with a 11-blade.  I extended the arteriotomy proximally and distally.   Immediately the lumen demonstrated minimal blood with limited calcification in the arterial wall.  I had concerns that an endarterectomy would damage this below-the-knee popliteal artery, so I elected to place a Linton patch on the below-the-knee popliteal artery.  This patient did not have an adequate vein for the [facility].  I fashioned a bovine pericardial patch for the geometry of this arteriotomy.  I sewed the bovine patch to the below-the-knee popliteal artery with a running stitch of 6-0 Prolene.  Prior to completion, I backbled both ends of the popliteal artery artery.  There was minimal bleeding from each end.     At this point, I bluntly dissected a space between the femoral condyles adjacent to the below-the-knee popliteal vessels.  I bluntly passed the long Gore metal tunneler between the femoral condyles in a subsartorial fashion to the groin incision.  The bullet on the tunneler was removed and then a 6 mm Propaten graft was sewn to the inner cannula with a 2-0 Silk.  I passed the conduit through the metal tunnel, taking care to maintain the orientation of the conduit.    The external iliac artery was clamped and then each profunda femoral artery was placed under tension.  An incision was made in the common femoral artery and extended proximally and distally with a Potts scissor.  I extended the arteriotomy distally until I could visualize the two profunda femoral artery takeoff in the posterior wall.  I released the tension on the medial profunda femoral artery.  I passed a 2 and 3 Fogarty distally, retrieving minimal thrombus.  I had return of backbleeding.  I placed the medial profunda femoral artery back under tension.  I released the tension on lateral profunda femoral artery.  I then released sequentially the tension on first order branches while passing a 2 Fogarty distally.  I was able to retrieve an organized thrombus from one of these first order branches.  There was return of  backbleeding.  The other branch returned minimal backbleeding with embolectomy.    The proximal conduit was spatulated to the dimensions of the arteriotomy.  The conduit was sewn to the common femoral artery with a running stitch of CV-5.  Prior to completing this anastomosis, all vessels were backbled.   No thrombus was noted from any vessels and backbleeding was: unchanged from post-embolectomy.  The anastomosis was completed in the usual fashion after tightening the suture line.  Attention was then turned to the popliteal exposure.   I reset the exposure of the popliteal space.  I determine the target segment for the anastomosis.  I applied tension to the artery proximally and distally with vessel loops.  I made an incision with a 11-blade in the [place] patch and extended it proximally and distally with a Potts scissor.  I pulled the conduit to appropriate tension and length, taking into account straightening out the leg.  I adjusted the length of the conduit sharply.  I spatulated this graft to meet the dimensions of the arteriotomy.  The graft was sewn to the [facility] pat[facility] below-the-knee popliteal artery with a running stitch of 6-0 prolene.  Prior to completing this anastomosis, all vessels were backbled.   No thrombus was noted from any vessels and backbleeding was: limited as previously.  The bypass conduit was allowed to bleed in an antegrade fashion.  The bleeding was: pulsatile.  The anastomosis was completed  in the usual fashion.  At this point, all incisions were washed out and Avitene was placed into both incisions.  The continuous doppler exam demonstrated distally: inconsistent posterior tibial artery signal.  I felt that left leg angiogram was necessary to fully evaluate this bypass.  I cannulated the graft proximally into the common femoral artery with a micropuncture needle.  I passed a microwire into the left common femoral artery.  The needle was exchanged for a microsheath.  The wire and  dilator were removed.  I connected the sheath to the IV extension tubing.  I did hand injections to interrogate the left proximal anastomosis and distal anastomosis.  The pedestal of the bed interfered with evaluation of the central segment of the graft.  The proximal anastomosis was intact without any technical issues.  The profunda femoral artery was not well evaluate due to superimposition of the graft over the profunda femoral artery.  The distal anatomosis was intact without any technical issues.  Runoff appeared to be primarily through posterior tibial artery and to a lesser degree via the peroneal artery.  The anterior tibial artery appeared to be occluded.  I placed a Z-stitch of 6-0 Prolene around the sheath.  I pulled out the sheath and tied the stitch.  This closed the puncture hole in the graft.    At this point, bleeding in both incisions were controlled with electrocautery and suture ligature.  After another round of Avitene, no further active bleeding was present.  I washed out both incisions.  The calf was repaired with a running stitch of 2-0 Vicryl to reapproximate the deep muscles.  The fascia was reapproximated with a double layer of 3-0 Vicryl.  The skin was reapproximated with a running subcuticular of 4-0 Monocryl.  After cleaning and drying the skin, the skin was reinforced with Dermabond.  Attention was turned to the groin.  The groin was repaired with a double layer of 2-0 Vicryl immediately superficial to the bypass conduit.  The superficial subcutaneous tissue was reapproximated with a double layer of 3-0 Vicryl.  The skin was reapproximated with a running subcuticular of 4-0 Monocryl.  The skin was cleaned, dried, and reinforced with Dermabond.  Distally, a posterior tibial artery was dopplerable.    COMPLICATIONS: none  CONDITION: stable   Leonides SakeBrian Jlon Betker, MD [facility] [place] Office: [phone] Pager: [phone]  12/14/2016, 6:43 PM

## 2016-12-14 NOTE — Interval H&P Note (Signed)
   History and Physical Update  I also discussed with the patient and family, salvage of the profunda femoral artery might not be possible as the embolization has occurred >2 weeks previously.     Leonides SakeBrian Laurenashley Viar, MD, FACS [facility] [place] Office: [phone] Pager: [phone]  12/14/2016, 2:24 PM

## 2016-12-14 NOTE — Transfer of Care (Signed)
Immediate Anesthesia Transfer of Care Note  Patient: Ryan [name]  Procedure(s) Performed: BYPASS GRAFT COMMON FEMORAL- BELOW KNEE POPLITEAL ARTERY with Bovine Patch to Below the knee poplitieal artery. (Left Leg Lower) THROMBECTOMY PROFUNDA FEMORAL ARTERY (Left ) ANGIOGRAM EXTREMITY LEFT (Left Leg Lower)  Patient Location: PACU  Anesthesia Type:General  Level of Consciousness: awake and alert   Airway & Oxygen Therapy: Patient Spontanous Breathing and Patient connected to nasal cannula oxygen  Post-op Assessment: Report given to RN and Post -op Vital signs reviewed and stable  Post vital signs: Reviewed and stable  Last Vitals:  Vitals:   12/14/16 1852 12/14/16 1856  BP: (!) 88/52 (!) 98/54  Pulse: 79 79  Resp: 18 18  Temp: 37 C   SpO2: 95% 95%    Last Pain:  Vitals:   12/14/16 0442  TempSrc: Oral  PainSc:       Patients Stated Pain Goal: 0 (12/13/16 2235)  Complications: No apparent anesthesia complications

## 2016-12-14 NOTE — Anesthesia Postprocedure Evaluation (Signed)
Anesthesia Post Note  Patient: Ryan [name]  Procedure(s) Performed: BYPASS GRAFT COMMON FEMORAL- BELOW KNEE POPLITEAL ARTERY with Bovine Patch to Below the knee poplitieal artery. (Left Leg Lower) THROMBECTOMY PROFUNDA FEMORAL ARTERY (Left ) ANGIOGRAM EXTREMITY LEFT (Left Leg Lower)     Patient location during evaluation: PACU Anesthesia Type: General Level of consciousness: awake and alert Pain management: pain level controlled Vital Signs Assessment: post-procedure vital signs reviewed and stable Respiratory status: spontaneous breathing, nonlabored ventilation and respiratory function stable Cardiovascular status: blood pressure returned to baseline and stable Postop Assessment: no apparent nausea or vomiting Anesthetic complications: no    Last Vitals:  Vitals:   12/14/16 2000 12/14/16 2016  BP: 110/60 (!) 113/58  Pulse: 88 89  Resp: 17 14  Temp: 36.7 C 36.8 C  SpO2: 94% 93%    Last Pain:  Vitals:   12/14/16 2016  TempSrc: Oral  PainSc: 8                  Ryan [name],W. EDMOND

## 2016-12-15 ENCOUNTER — Encounter (HOSPITAL_COMMUNITY): Payer: Self-pay | Admitting: Vascular Surgery

## 2016-12-15 ENCOUNTER — Inpatient Hospital Stay (HOSPITAL_COMMUNITY): Payer: Medicare Other

## 2016-12-15 DIAGNOSIS — I739 Peripheral vascular disease, unspecified: Secondary | ICD-10-CM

## 2016-12-15 LAB — BASIC METABOLIC PANEL
Anion gap: 5 (ref 5–15)
BUN: 19 mg/dL (ref 6–20)
CO2: 20 mmol/L — ABNORMAL LOW (ref 22–32)
CREATININE: 1.13 mg/dL (ref 0.61–1.24)
Calcium: 8.1 mg/dL — ABNORMAL LOW (ref 8.9–10.3)
Chloride: 108 mmol/L (ref 101–111)
GFR calc Af Amer: >60 mL/min (ref >60)
Glucose, Bld: 203 mg/dL — ABNORMAL HIGH (ref 65–99)
Potassium: 4.6 mmol/L (ref 3.5–5.1)
SODIUM: 133 mmol/L — AB (ref 135–145)

## 2016-12-15 LAB — CBC
HCT: 32.9 % — ABNORMAL LOW (ref 39.0–52.0)
Hemoglobin: 10.5 g/dL — ABNORMAL LOW (ref 13.0–17.0)
MCH: 27 pg (ref 26.0–34.0)
MCHC: 31.9 g/dL (ref 30.0–36.0)
MCV: 84.6 fL (ref 78.0–100.0)
PLATELETS: 252 10*3/uL (ref 150–400)
RBC: 3.89 MIL/uL — ABNORMAL LOW (ref 4.22–5.81)
RDW: 14 % (ref 11.5–15.5)
WBC: 9.1 10*3/uL (ref 4.0–10.5)

## 2016-12-15 MED ORDER — INFLUENZA VAC SPLIT HIGH-DOSE 0.5 ML IM SUSY
0.5000 mL | PREFILLED_SYRINGE | INTRAMUSCULAR | Status: DC
  Filled 2016-12-15: qty 0.5

## 2016-12-15 NOTE — Progress Notes (Signed)
Pt c/o pain rating 7/10. MD notified for clarification of which pain medication to be given based on orders. Verbal orders given to administer Dilaudid or Percocet for pain management. Will continue to monitor. Gregor HamsAlisha Jaydan Chretien, RN

## 2016-12-15 NOTE — Progress Notes (Addendum)
  Progress Note    12/15/2016 7:44 AM 1 Day Post-Op  Subjective:  No complaints this morning from patient.  He denies rest pain L foot.   Vitals:   12/14/16 2345 12/15/16 0400  BP: 114/68 (!) 98/55  Pulse:  60  Resp:  11  Temp: 97.8 F (36.6 C) 98 F (36.7 C)  SpO2:  99%    Physical Exam: Lungs:  No increased respiratory effort Incisions:  L groin incision without palpable hematoma or continued bleeding; L distal leg incision without palpable hematoma or continued bleeding Extremities:  Palpable L PT; DP by doppler Abdomen:  Soft, non tender  CBC    Component Value Date/Time   WBC 9.1 12/15/2016 0003   RBC 3.89 (L) 12/15/2016 0003   HGB 10.5 (L) 12/15/2016 0003   HCT 32.9 (L) 12/15/2016 0003   PLT 252 12/15/2016 0003   MCV 84.6 12/15/2016 0003   MCH 27.0 12/15/2016 0003   MCHC 31.9 12/15/2016 0003   RDW 14.0 12/15/2016 0003    BMET    Component Value Date/Time   NA 133 (L) 12/15/2016 0003   K 4.6 12/15/2016 0003   CL 108 12/15/2016 0003   CO2 20 (L) 12/15/2016 0003   GLUCOSE 203 (H) 12/15/2016 0003   BUN 19 12/15/2016 0003   CREATININE 1.13 12/15/2016 0003   CALCIUM 8.1 (L) 12/15/2016 0003   GFRNONAA >60 12/15/2016 0003   GFRAA >60 12/15/2016 0003    INR    Component Value Date/Time   INR 0.9 06/16/2008 1010     Intake/Output Summary (Last 24 hours) at 12/15/2016 0744 Last data filed at 12/15/2016 0510 Gross per 24 hour  Intake 2276.67 ml  Output 1295 ml  Net 981.67 ml     Assessment/Plan:  66 y.o. male is s/p: L femoral to BK pop bypass with PTFE conduit; embolectomy of L profunda  D/c NS infusion PT/OT to evaluate and treat Continue asa/plavix Monitor peripheral signals Anticipated d/c early next week   -DVT prophylaxis:  lovenox   Emilie RutterMatthew Eveland, PA-C [facility] [phone] 12/15/2016 7:44 AM  Addendum  I have independently interviewed and examined the patient, and I agree with the physician  assistant's findings.  Distal exam is consistent today with completion angiogram: intact flow to foot via PT dominant, peroneal to AT (dopplerable).  This remarkably improved from preop ABI 0.  Except 3-5 days minimal recovery and may need rehab depending on PT/OT evaluation.  This went nearly 2 weeks with L leg ischemia, so I suspect he may have developed some functional loss during that period of time.  Leonides SakeBrian Charan Prieto, MD, FACS [facility] [place] Office: [phone] Pager: [phone]  12/15/2016, 9:46 AM

## 2016-12-15 NOTE — Progress Notes (Signed)
New Admission Note:   Arrival Method: From PACU via bed Mental Orientation: A&O Telemetry: box 4E26 Assessment: Completed Skin: Surgical incision IV: L AC, R hand Pain:7/10 Tubes: None Safety Measures: Safety Fall Prevention Plan has been discussed  Admission 4 East Orientation: Patient has been orientated to the room, unit and staff.  Family: none present at bedside  Orders to be reviewed and implemented. Will continue to monitor the patient. Call light has been placed within reach and bed alarm has been activated. Tele box applied. CCMD notified    Gregor HamsAlisha Maurico Perrell, RN

## 2016-12-15 NOTE — Progress Notes (Signed)
Nutrition Brief Note  Patient identified on the Malnutrition Screening Tool (MST) Report.  Wt Readings from Last 15 Encounters:  12/15/16 135 lb (61.2 kg)  12/11/16 131 lb 8 oz (59.6 kg)  11/28/16 153 lb (69.4 kg)  08/11/14 153 lb (69.4 kg)  06/11/14 153 lb (69.4 kg)    Body mass index is 21.8 kg/m. Patient meets criteria for Normal based on current BMI.   Nutrition focused physical exam completed.  No muscle or subcutaneous fat depletion noticed.  Current diet order is Heart Healthy, patient is consuming approximately 100% of meals at this time.    Family at bedside confirm pt eating well. Labs and medications reviewed.    No nutrition interventions warranted at this time. If nutrition issues arise, please consult RD.   Maureen ChattersKatie Jazlen Ogarro, RD, LDN Pager #: [phone] After-Hours Pager #: [phone]

## 2016-12-15 NOTE — Evaluation (Signed)
Occupational Therapy Evaluation and Discharge Patient Details Name: Ryan [name] MRN: [id number] DOB: 09/02/1950 Today's Date: 12/15/2016    History of Present Illness Pt is a 66 y/o male admitted on 12/13/16 with PMH including HTN and PVD. Now s/p: L femoral to BK pop bypass with PTFE conduit; embolectomy of L profunda on 12/14/16.   Clinical Impression   PTA Pt independent in ADL and mobility. Pt is currently mod I with mobility and RW, and mod A for LB ADL. Pt's wife and daughter present for session and all 3 people received education on compensatory strategies for ADL and education on 3 in 1 vs shower chair DME and long handle sponge/reacher grabber. Education complete. No questions or concerns at the end of session from Pt or family, OT to sign off at this time. For DME, I am recommending a shower chair instead of a 3 in 1 because this patient has a tub shower with glass doors. Due to the framing of the doors, typically a 3 in 1 does not work since it does not fit all the way in the tub and is forced to straddle the tub edge. Pt has been educated in both, and some seated surface while bathing is imperative for safety as Pt has decreased standing activity tolerance. Thank you for the opportunity to serve this patient.     Follow Up Recommendations  No OT follow up;Supervision - Intermittent    Equipment Recommendations  Tub/shower seat(a 3 in 1 will not work for him due to tub/shower doors)    Recommendations for Other Services       Precautions / Restrictions Precautions Precautions: Fall Restrictions Weight Bearing Restrictions: No      Mobility Bed Mobility Overal bed mobility: Independent                Transfers Overall transfer level: Independent Equipment used: Rolling walker (2 wheeled)                  Balance Overall balance assessment: Needs assistance Sitting-balance support: No upper extremity supported;Feet supported Sitting balance-Leahy Scale:  Good Sitting balance - Comments: sitting EOB with no back support   Standing balance support: No upper extremity supported;During functional activity Standing balance-Leahy Scale: Fair Standing balance comment: Able to static stand for limited time with no UE support and min guard for safety                           ADL either performed or assessed with clinical judgement   ADL Overall ADL's : Needs assistance/impaired Eating/Feeding: Modified independent   Grooming: Standing;Wash/dry hands;Wash/dry face;Supervision/safety Grooming Details (indicate cue type and reason): for short periods of time, supervision for safety - no assist needed Upper Body Bathing: Modified independent   Lower Body Bathing: Supervison/ safety;With adaptive equipment(standing)   Upper Body Dressing : Modified independent   Lower Body Dressing: Moderate assistance;With caregiver independent assisting;Sit to/from stand Lower Body Dressing Details (indicate cue type and reason): educated Pt and family to dress the operated leg first Toilet Transfer: Modified Independent;RW   Toileting- Clothing Manipulation and Hygiene: Modified independent;Sit to/from stand   Tub/ Shower Transfer: Modified independent;Ambulation;Tub transfer Tub/Shower Transfer Details (indicate cue type and reason): Pt will need DME due to decreased activity tolerance so he will be able to sit for bathing Functional mobility during ADLs: Modified independent;Rolling walker General ADL Comments: Pain management and limited ROM in LE due to pain impacting ADL  Vision Patient Visual Report: No change from baseline       Perception     Praxis      Pertinent Vitals/Pain Pain Assessment: 0-10 Pain Score: 8  Pain Location: L calf Pain Descriptors / Indicators: Sore Pain Intervention(s): Monitored during session     Hand Dominance     Extremity/Trunk Assessment             Communication  Communication Communication: No difficulties   Cognition Arousal/Alertness: Awake/alert Behavior During Therapy: WFL for tasks assessed/performed Overall Cognitive Status: Within Functional Limits for tasks assessed                                     General Comments  Wife and daughter present and problem solved seated tub/shower situation    Exercises     Shoulder Instructions      Home Living Family/patient expects to be discharged to:: Private residence Living Arrangements: Spouse/significant other;Other relatives Available Help at Discharge: Family;Available 24 hours/day Type of Home: House Home Access: Stairs to enter Entergy CorporationEntrance Stairs-Number of Steps: 1   Home Layout: One level     Bathroom Shower/Tub: Tub/shower unit;Door   Foot LockerBathroom Toilet: Standard     Home Equipment: Environmental consultantWalker - 2 wheels;Hand held shower head          Prior Functioning/Environment Level of Independence: Independent        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, 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, 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        OT Problem List: Decreased range of motion;Decreased knowledge of precautions;Impaired balance (sitting and/or standing);Decreased activity tolerance      OT Treatment/Interventions:      OT Goals(Current goals can be found in the care plan section) Acute Rehab OT Goals Patient Stated Goal: Return home OT Goal Formulation: With patient/family Time For Goal Achievement: 12/29/16 Potential to Achieve Goals: Good  OT Frequency:     Barriers to D/C:            Co-evaluation              AM-PAC PT "6 Clicks" Daily Activity     Outcome Measure Help from another person eating meals?: None Help from another person taking care of personal grooming?: None Help from another person toileting, which includes using toliet, bedpan, or urinal?: None Help from another person bathing (including washing, rinsing, drying)?: A Little Help from another person to put on and taking off regular upper  body clothing?: None Help from another person to put on and taking off regular lower body clothing?: A Lot 6 Click Score: 21   End of Session Equipment Utilized During Treatment: Rolling walker Nurse Communication: Mobility status  Activity Tolerance: Patient tolerated treatment well Patient left: in bed;Other (comment)(going to procedure with transport)  OT Visit Diagnosis: Pain Pain - Right/Left: Left Pain - part of body: Leg                Time: [id number] OT Time Calculation (min): 12 min Charges:  OT General Charges $OT Visit: 1 Visit OT Evaluation $OT Eval Low Complexity: 1 Low G-Codes:     Sherryl MangesLaura Lilliann Rossetti OTR/L [phone] Evern BioLaura J Kareli Hossain 12/15/2016, 1:47 PM

## 2016-12-15 NOTE — Progress Notes (Signed)
ABI's have been completed.  12/15/16 11:35 AM Olen CordialGreg Eulises Kijowski RVT

## 2016-12-15 NOTE — Evaluation (Signed)
Physical Therapy Evaluation Patient Details Name: Ryan [name] MRN: [id number] DOB: 01/20/1951 Today's Date: 12/15/2016   History of Present Illness  Pt is a 66 y/o male admitted on 12/13/16 with PMH including HTN and PVD. Now s/p: L femoral to BK pop bypass with PTFE conduit; embolectomy of L profunda on 12/14/16.    Clinical Impression  Patient evaluated by Physical Therapy with no further acute PT needs identified. PTA, pt indep and lives with family available for 24/7 assist. All education has been completed and the patient has no further questions. Pt mod indep for ambulation with RW secondary to LLE soreness. PT is signing off. Thank you for this referral.    Follow Up Recommendations No PT follow up;Supervision for mobility/OOB    Equipment Recommendations  None recommended by PT    Recommendations for Other Services OT consult     Precautions / Restrictions Precautions Precautions: Fall Restrictions Weight Bearing Restrictions: No      Mobility  Bed Mobility Overal bed mobility: Independent                Transfers Overall transfer level: Independent Equipment used: None;Rolling walker (2 wheeled)                Ambulation/Gait Ambulation/Gait assistance: Modified independent (Device/Increase time) Ambulation Distance (Feet): 150 Feet Assistive device: Rolling walker (2 wheeled);1 person hand held assist Gait Pattern/deviations: Step-through pattern;Decreased stride length Gait velocity: Decreased Gait velocity interpretation: <1.8 ft/sec, indicative of risk for recurrent falls General Gait Details: Mild unsteady gait with HHA for balance; pt reports secondary to LLE soreness. Stability much improved with RW; pt mod indep with RW  Stairs Stairs: Yes       General stair comments: Educ on technique for ascend/descending lip into home with RW  Wheelchair Mobility    Modified Rankin (Stroke Patients Only)       Balance Overall balance  assessment: Needs assistance   Sitting balance-Leahy Scale: Good       Standing balance-Leahy Scale: Fair Standing balance comment: Able to static stand with no UE support and min guard; dynamic stability improved with BUE support                             Pertinent Vitals/Pain Pain Assessment: 0-10 Pain Score: 8  Pain Location: L calf Pain Descriptors / Indicators: Sore Pain Intervention(s): Monitored during session    Home Living Family/patient expects to be discharged to:: Private residence Living Arrangements: Spouse/significant other;Other relatives Available Help at Discharge: Family;Available 24 hours/day Type of Home: House Home Access: Stairs to enter   Entergy CorporationEntrance Stairs-Number of Steps: 1 Home Layout: One level Home Equipment: Environmental consultantWalker - 2 wheels      Prior Function Level of Independence: Independent               Hand Dominance        Extremity/Trunk Assessment   Upper Extremity Assessment Upper Extremity Assessment: Overall WFL for tasks assessed    Lower Extremity Assessment Lower Extremity Assessment: Overall WFL for tasks assessed       Communication   Communication: No difficulties  Cognition Arousal/Alertness: Awake/alert Behavior During Therapy: WFL for tasks assessed/performed Overall Cognitive Status: Within Functional Limits for tasks assessed  General Comments      Exercises     Assessment/Plan    PT Assessment Patent does not need any further PT services  PT Problem List         PT Treatment Interventions      PT Goals (Current goals can be found in the Care Plan section)  Acute Rehab PT Goals Patient Stated Goal: Return home PT Goal Formulation: With patient Time For Goal Achievement: 12/29/16 Potential to Achieve Goals: Good    Frequency     Barriers to discharge        Co-evaluation               AM-PAC PT "6 Clicks" Daily Activity   Outcome Measure Difficulty turning over in bed (including adjusting bedclothes, sheets and blankets)?: None Difficulty moving from lying on back to sitting on the side of the bed? : None Difficulty sitting down on and standing up from a chair with arms (e.g., wheelchair, bedside commode, etc,.)?: None Help needed moving to and from a bed to chair (including a wheelchair)?: None Help needed walking in hospital room?: None Help needed climbing 3-5 steps with a railing? : A Little 6 Click Score: 23    End of Session Equipment Utilized During Treatment: Gait belt Activity Tolerance: Patient tolerated treatment well Patient left: in bed;with call bell/phone within reach;with family/visitor present Nurse Communication: Mobility status PT Visit Diagnosis: Other abnormalities of gait and mobility (R26.89);Unsteadiness on feet (R26.81)    Time: [id number] PT Time Calculation (min) (ACUTE ONLY): 14 min   Charges:   PT Evaluation $PT Eval Low Complexity: 1 Low     PT G Codes:   PT G-Codes **NOT FOR INPATIENT CLASS** Functional Assessment Tool Used: AM-PAC 6 Clicks Basic Mobility Functional Limitation: Mobility: Walking and moving around Mobility: Walking and Moving Around Current Status ([id number](G8978): At least 1 percent but less than 20 percent impaired, limited or restricted Mobility: Walking and Moving Around Goal Status [phone](G8979): At least 1 percent but less than 20 percent impaired, limited or restricted Mobility: Walking and Moving Around Discharge Status [phone](G8980): At least 1 percent but less than 20 percent impaired, limited or restricted   Ina HomesJaclyn Addelynn Batte, PT, DPT Acute Rehab Services  Pager: [phone]  Ryan [name] 12/15/2016, 9:06 AM

## 2016-12-16 NOTE — Progress Notes (Addendum)
[facility]  Subjective  - Doing a little better.    Objective 104/73 (!) 55 98.2 F (36.8 C) (Oral) 12 99%  Intake/Output Summary (Last 24 hours) at 12/16/2016 0839 Last data filed at 12/16/2016 0100 Gross per 24 hour  Intake 290 ml  Output 1850 ml  Net -1560 ml    Doppler PT/DP/peroneal left LE Left LE incisions healing well Heart RRR Lungs non labored breathing  Assessment/Planning:  L femoral to BK pop bypass with PTFE conduit; embolectomy of L profunda  Hypotension 10-115 systolic asymptomatic hold BP medication if systolic below 120. PT/OT for mobility Continue asa/plavix    Mosetta Pigeonmma Maureen Collins 12/16/2016 8:39 AM --  Laboratory Lab Results: Recent Labs    12/14/16 0523 12/15/16 0003  WBC 6.0 9.1  HGB 12.2* 10.5*  HCT 37.8* 32.9*  PLT 288 252   BMET Recent Labs    12/14/16 0523 12/15/16 0003  NA 134* 133*  K 5.1 4.6  CL 105 108  CO2 22 20*  GLUCOSE 125* 203*  BUN 26* 19  CREATININE 1.21 1.13  CALCIUM 9.2 8.1*    COAG Lab Results  Component Value Date   INR 0.9 06/16/2008   INR 1.9 (H) 12/31/2007   INR 1.4 12/30/2007   No results found for: PTT  Addendum  I have independently interviewed and examined the patient, and I agree with the physician assistant's findings.  Pain slowly improving.  L ABI augmented to 0.77 from 0.  Home once pain better  Leonides SakeBrian Chen, MD, FACS [facility] [place] Office: [phone] Pager: [phone]  12/16/2016, 2:42 PM

## 2016-12-17 NOTE — Progress Notes (Addendum)
[facility]  Subjective  - pain in the left foot.   Objective 101/60 65 98.3 F (36.8 C) 19 97%  Intake/Output Summary (Last 24 hours) at 12/17/2016 0817 Last data filed at 12/17/2016 0350 Gross per 24 hour  Intake -  Output 1300 ml  Net -1300 ml    Palpable left DP, doppler PT/DP/Peroneal signals Incision healing well Right foot toes erythema no open wounds, improving with active range of motion.  Assessment/Planning: POD # 3 L femoral to BK pop bypass with PTFE conduit; embolectomy of L profunda  Hypotensive symptomatic holding BP medication encourage mobility By pass patent.  Plan to discharge home once pain is controlled  Mosetta Pigeonmma Maureen Collins 12/17/2016 8:17 AM   Addendum  I have independently interviewed and examined the patient, and I agree with the physician assistant's findings.  Pain almost fully controlled.  Home probably tomorrow once pain control adequate.     Leonides SakeBrian Beautiful Pensyl, MD, FACS [facility] [place] Office: [phone] Pager: [phone]  12/17/2016, 10:41 AM   --  Laboratory Lab Results: Recent Labs    12/15/16 0003  WBC 9.1  HGB 10.5*  HCT 32.9*  PLT 252   BMET Recent Labs    12/15/16 0003  NA 133*  K 4.6  CL 108  CO2 20*  GLUCOSE 203*  BUN 19  CREATININE 1.13  CALCIUM 8.1*    COAG Lab Results  Component Value Date   INR 0.9 06/16/2008   INR 1.9 (H) 12/31/2007   INR 1.4 12/30/2007   No results found for: PTT

## 2016-12-18 MED ORDER — ENSURE ENLIVE PO LIQD: 237.0000 mL | Freq: Two times a day (BID) | ORAL | 12 refills | Status: DC

## 2016-12-18 MED ORDER — OXYCODONE-ACETAMINOPHEN 5-325 MG PO TABS: 1.0000 | ORAL_TABLET | Freq: Four times a day (QID) | ORAL | 0 refills | Status: DC | PRN

## 2016-12-18 NOTE — Progress Notes (Signed)
Pt discharged home, per order. IV and telemetry box removed. Pt received discharge instructions and all questions were answered. Pt received paper prescription for pain medication and was instructed to get it filled at his pharmacy. Pt verbalized understanding. Pt left with all of his belongings. Pt discharged via wheelchair and was accompanied by family and pt's nurse tech.   Berdine DanceLauren Moffitt BSN, RN

## 2016-12-18 NOTE — Progress Notes (Addendum)
  Progress Note    12/18/2016 7:13 AM 4 Days Post-Op  Subjective:  Still has pain but is controlled with pain meds.  Has not had any IV pain meds  Tm 99.4 now 99.1 HR 70's-80's NSR 80's-110's systolic 99% RA  Vitals:   12/18/16 0020 12/18/16 0400  BP:  97/72  Pulse: 76 83  Resp:  19  Temp: 98.4 F (36.9 C) 99.1 F (37.3 C)  SpO2: 99% 100%    Physical Exam: Cardiac:  regular Lungs:  Non labored Incisions:  Left groin and left BK incisions are healing nicely Extremities:  +palpable left DP/PT  CBC    Component Value Date/Time   WBC 9.1 12/15/2016 0003   RBC 3.89 (L) 12/15/2016 0003   HGB 10.5 (L) 12/15/2016 0003   HCT 32.9 (L) 12/15/2016 0003   PLT 252 12/15/2016 0003   MCV 84.6 12/15/2016 0003   MCH 27.0 12/15/2016 0003   MCHC 31.9 12/15/2016 0003   RDW 14.0 12/15/2016 0003    BMET    Component Value Date/Time   NA 133 (L) 12/15/2016 0003   K 4.6 12/15/2016 0003   CL 108 12/15/2016 0003   CO2 20 (L) 12/15/2016 0003   GLUCOSE 203 (H) 12/15/2016 0003   BUN 19 12/15/2016 0003   CREATININE 1.13 12/15/2016 0003   CALCIUM 8.1 (L) 12/15/2016 0003   GFRNONAA >60 12/15/2016 0003   GFRAA >60 12/15/2016 0003    INR    Component Value Date/Time   INR 0.9 06/16/2008 1010     Intake/Output Summary (Last 24 hours) at 12/18/2016 0713 Last data filed at 12/18/2016 0401 Gross per 24 hour  Intake 125 ml  Output 1600 ml  Net -1475 ml     Assessment:  66 y.o. male is s/p:  1.  Left common femoral artery to below-the-knee popliteal artery bypass with Propaten 2.  [place] patch left below-the-knee popliteal artery  3.  Embolectomy left profunda femoral arteries 4.  Open cannulation of left common femoral artery 5.  Left leg angiogram    4 Days Post-Op  Plan: -pt doing well this am with palpable left DP/PT -BP still soft-continue to hold BP meds for now.  Will need f/u with PCP to evaluate BP and meds in ~ 1 week.  He will be instructed not to take these  medications until further evaluated.  -no recs for outpatient PT/OT -DVT prophylaxis:  Lovenox -most likely home today & f/u with Dr. Imogene Burnhen in a couple of weeks.   Doreatha MassedSamantha Rhyne, PA-C [facility] [phone] 12/18/2016 7:13 AM  I have independently interviewed and examined patient and agree with PA assessment and plan above. Holding bp meds. Palpable pedal pulses. dispo is home with outpatient pt/ot.    C. [name], MD [facility] [place] Office: [phone] Pager: [phone]

## 2016-12-18 NOTE — Discharge Instructions (Signed)
[facility]  Discharge instructions  Lower Extremity Bypass Surgery  Please refer to the following instruction for your post-procedure care. Your surgeon or physician assistant will discuss any changes with you.  Activity  You are encouraged to walk as much as you can. You can slowly return to normal activities during the month after your surgery. Avoid strenuous activity and heavy lifting until your doctor tells you it's OK. Avoid activities such as vacuuming or swinging a golf club. Do not drive until your doctor give the OK and you are no longer taking prescription pain medications. It is also normal to have difficulty with sleep habits, eating and bowel movement after surgery. These will go away with time.  Bathing/Showering  You may shower after you go home. Do not soak in a bathtub, hot tub, or swim until the incision heals completely.  Incision Care  Clean your incision with mild soap and water. Shower every day. Pat the area dry with a clean towel. You do not need a bandage unless otherwise instructed. Do not apply any ointments or creams to your incision. If you have open wounds you will be instructed how to care for them or a visiting nurse may be arranged for you. If you have staples or sutures along your incision they will be removed at your post-op appointment. You may have skin glue on your incision. Do not peel it off. It will come off on its own in about one week.  Wash the groin wound with soap and water daily and pat dry. (No tub bath-only shower)  Then put a dry gauze or washcloth in the groin to keep this area dry to help prevent wound infection.  Do this daily and as needed.  Do not use Vaseline or neosporin on your incisions.  Only use soap and water on your incisions and then protect and keep dry.  Diet  Resume your normal diet. There are no special food restrictions following this procedure. A low fat/ low cholesterol diet is  recommended for all patients with vascular disease. In order to heal from your surgery, it is CRITICAL to get adequate nutrition. Your body requires vitamins, minerals, and protein. Vegetables are the best source of vitamins and minerals. Vegetables also provide the perfect balance of protein. Processed food has little nutritional value, so try to avoid this.  Medications  Resume taking all your medications unless your doctor or physician assistant tells you not to. If your incision is causing pain, you may take over-the-counter pain relievers such as acetaminophen (Tylenol). If you were prescribed a stronger pain medication, please aware these medication can cause nausea and constipation. Prevent nausea by taking the medication with a snack or meal. Avoid constipation by drinking plenty of fluids and eating foods with high amount of fiber, such as fruits, vegetables, and grains. Take Colace 100 mg (an over-the-counter stool softener) twice a day as needed for constipation. Do not take Tylenol if you are taking prescription pain medications.  Stop taking your blood pressure medications as shown on your after visit summary.  Follow up with your PCP, Dr. Mirna MiresGerald Hill in a week to re-evaluate your blood pressure.      TAKE YOUR BLOOD PRESSURE TWICE A DAY AND WRITE IT DOWN FOR DR. HILL TO SEE.  IF YOUR SYSTOLIC (TOP NUMBER) GETS ABOVE 130, RESTART YOUR HYZAAR.    Follow Up  -Our office will schedule a follow up appointment 2-3 weeks following discharge. -Follow up  with Dr. Loleta ChanceHill in one week to check on blood pressure.   Please call us immediately for any of the following conditions  Severe or worsening pain in your legs or feet while at rest or while walking Increase pain, redness, warmth, or drainage (pus) from your incision site(s) Fever of 101 degree or higher The swelling in your leg with the bypass suddenly worsens and becomes more painful than when you were in the hospital If you have been  instructed to feel your graft pulse then you should do so every day. If you can no longer feel this pulse, call the office immediately. Not all patients are given this instruction.  Leg swelling is common after leg bypass surgery.  The swelling should improve over a few months following surgery. To improve the swelling, you may elevate your legs above the level of your heart while you are sitting or resting. Your surgeon or physician assistant may ask you to apply an ACE wrap or wear compression (TED) stockings to help to reduce swelling.  Reduce your risk of vascular disease  Stop smoking. If you would like help call [facility] at 1-800-QUIT-NOW ((209)440-71861-463 020 6132) or [place] at [phone].  Manage your cholesterol Maintain a desired weight Control your diabetes weight Control your diabetes Keep your blood pressure down  If you have any questions, please call the office at [phone]

## 2016-12-18 NOTE — Discharge Summary (Signed)
Discharge Summary     Sharyon MedicusLevon Olver 02/02/1950 66 y.o. male  [id number]  Admission Date: 12/13/2016  Discharge Date: 12/18/16  Physician: No att. providers found  Admission Diagnosis: PAD (peripheral artery disease) (HCC) [I73.9]   HPI:   This is a 66 y.o. male who was interviewed and re-examined.  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  These collaterals are clearly inadequate to maintain viability of this patient left foot.  I recommend: left common femoral artery to below-the-knee popliteal artery bypass, profunda femoral artery thrombectomy.     The risk, benefits, and alternative for bypass operations were discussed with the patient.    The patient is aware the risks include but are not limited to: bleeding, infection, myocardial infarction, stroke, limb loss, nerve damage, limb edema, need for additional procedures in the future, wound complications, and inability to complete the bypass.   The patient is aware of these risks and agreed to proceed    Hospital Course:  The patient was admitted to the hospital [facility] and underwent: Procedure Performed: 1.  US guided cannulation of right common femoral artery 2.  Aortogram with left lower extremity runoff 3.  Moderate sedation with fentanyl and versed for 14 minutes  Findings: The aorta and iliac segments are free of flow-limiting disease.  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  The left SFA is stented and they are all occluded.  The profunda femoris artery has 2 branches that are occluded shortly after the takeoff.  He reconstitutes a popliteal artery just above the knee that is quite diminutive and appears to have two-vessel runoff via the  peroneal and posterior tibial arteries to at least the mid calf there is some sign of flow to the ankle.  There is no discernible anterior tibial artery on the left.  The pt was then taken to the operating room on 12/14/2016 and underwent: 1.  Left common femoral artery to below-the-knee popliteal artery bypass with Propaten 2.  Linton patch left below-the-knee popliteal artery  3.  Embolectomy left profunda femoral arteries 4.  Open cannulation of left common femoral artery 5.  Left leg angiogram    FINDING(S): 1.  Organized thrombus retrieved from profunda femoral artery  2.  Dopplerable posterior tibial artery at end of case 3.  Left leg runoff via posterior tibial artery and peroneal artery  The pt tolerated the procedure well and was transported to the PACU in good condition.   By POD 1, the pt had palpable left PT and DP by doppler.    ABI's on 12/15/16: Right:  0.42 Left:  0.77  By POD 2, the pt was hypotensive (asymptomatic).  His BP medication was held.  PT/OT were consulted.  Continue Plavix/aspirin.  By POD 3, pt continued to do well.  His BP medications were continuing to be held.    By POD 4, his BP was still soft.  He is instructed to f/u with his PCP for evaluation of BP and medications.  He had palpable pedal pulses.   The remainder of the hospital course consisted of increasing mobilization and increasing intake of solids without difficulty.  CBC    Component Value Date/Time   WBC 9.1 12/15/2016 0003   RBC 3.89 (L) 12/15/2016  0003   HGB 10.5 (L) 12/15/2016 0003   HCT 32.9 (L) 12/15/2016 0003   PLT 252 12/15/2016 0003   MCV 84.6 12/15/2016 0003   MCH 27.0 12/15/2016 0003   MCHC 31.9 12/15/2016 0003   RDW 14.0 12/15/2016 0003    BMET    Component Value Date/Time   NA 133 (L) 12/15/2016 0003   K 4.6 12/15/2016 0003   CL 108 12/15/2016 0003   CO2 20 (L) 12/15/2016 0003   GLUCOSE 203 (H) 12/15/2016 0003   BUN 19 12/15/2016 0003   CREATININE 1.13  12/15/2016 0003   CALCIUM 8.1 (L) 12/15/2016 0003   GFRNONAA >60 12/15/2016 0003   GFRAA >60 12/15/2016 0003       Discharge Diagnosis:  PAD (peripheral artery disease) (HCC) [I73.9]  Secondary Diagnosis: Patient Active Problem List   Diagnosis Date Noted  . Critical lower limb ischemia 12/11/2016  . Claudication in peripheral vascular disease (HCC) 11/28/2016  . PAD (peripheral artery disease) (HCC)    Past Medical History:  Diagnosis Date  . Hypertension   . PVD (peripheral vascular disease) (HCC)      Allergies as of 12/18/2016   No Known Allergies     Medication List    STOP taking these medications   atenolol 50 MG tablet Commonly known as:  TENORMIN   losartan-hydrochlorothiazide 100-25 MG tablet Commonly known as:  HYZAAR     TAKE these medications   aspirin 81 MG tablet Take 81 mg by mouth daily.   clopidogrel 75 MG tablet Commonly known as:  PLAVIX Take 1 tablet (75 mg total) by mouth daily.   feeding supplement (ENSURE ENLIVE) Liqd Take 237 mLs 2 (two) times daily between meals by mouth.   nitroGLYCERIN 0.2 mg/hr patch Commonly known as:  NITRODUR - Dosed in mg/24 hr Place 1 patch (0.2 mg total) onto the skin daily.   oxyCODONE-acetaminophen 5-325 MG tablet Commonly known as:  PERCOCET/ROXICET Take 1-2 tablets every 6 (six) hours as needed by mouth for moderate pain or severe pain. What changed:  when to take this   rosuvastatin 20 MG tablet Commonly known as:  CRESTOR Take 20 mg by mouth daily.       Discharge Instructions: [facility] Discharge instructions Lower Extremity Bypass Surgery  Please refer to the following instruction for your post-procedure care. Your surgeon or physician assistant will discuss any changes with you.  Activity  You are encouraged to walk as much as you can. You can slowly return to normal activities during the month after your surgery. Avoid strenuous activity and heavy  lifting until your doctor tells you it's OK. Avoid activities such as vacuuming or swinging a golf club. Do not drive until your doctor give the OK and you are no longer taking prescription pain medications. It is also normal to have difficulty with sleep habits, eating and bowel movement after surgery. These will go away with time.  Bathing/Showering  You may shower after you go home. Do not soak in a bathtub, hot tub, or swim until the incision heals completely.  Incision Care  Clean your incision with mild soap and water. Shower every day. Pat the area dry with a clean towel. You do not need a bandage unless otherwise instructed. Do not apply any ointments or creams to your incision. If you have open wounds you will be instructed how to care for them or a visiting nurse may be arranged for you. If you have staples  or sutures along your incision they will be removed at your post-op appointment. You may have skin glue on your incision. Do not peel it off. It will come off on its own in about one week.  Wash the groin wound with soap and water daily and pat dry. (No tub bath-only shower)  Then put a dry gauze or washcloth in the groin to keep this area dry to help prevent wound infection.  Do this daily and as needed.  Do not use Vaseline or neosporin on your incisions.  Only use soap and water on your incisions and then protect and keep dry.  Diet  Resume your normal diet. There are no special food restrictions following this procedure. A low fat/ low cholesterol diet is recommended for all patients with vascular disease. In order to heal from your surgery, it is CRITICAL to get adequate nutrition. Your body requires vitamins, minerals, and protein. Vegetables are the best source of vitamins and minerals. Vegetables also provide the perfect balance of protein. Processed food has little nutritional value, so try to avoid this.  Medications  Resume taking all your medications unless your doctor or  Physician Assistant tells you not to. If your incision is causing pain, you may take over-the-counter pain relievers such as acetaminophen (Tylenol). If you were prescribed a stronger pain medication, please aware these medication can cause nausea and constipation. Prevent nausea by taking the medication with a snack or meal. Avoid constipation by drinking plenty of fluids and eating foods with high amount of fiber, such as fruits, vegetables, and grains. Take Colace 100 mg (an over-the-counter stool softener) twice a day as needed for constipation. Do not take Tylenol if you are taking prescription pain medications.  Follow Up  Our office will schedule a follow up appointment 2-3 weeks following discharge.  Please call us immediately for any of the following conditions  .Severe or worsening pain in your legs or feet while at rest or while walking .Increase pain, redness, warmth, or drainage (pus) from your incision site(s) Fever of 101 degree or higher The swelling in your leg with the bypass suddenly worsens and becomes more painful than when you were in the hospital If you have been instructed to feel your graft pulse then you should do so every day. If you can no longer feel this pulse, call the office immediately. Not all patients are given this instruction.  Leg swelling is common after leg bypass surgery.  The swelling should improve over a few months following surgery. To improve the swelling, you may elevate your legs above the level of your heart while you are sitting or resting. Your surgeon or physician assistant may ask you to apply an ACE wrap or wear compression (TED) stockings to help to reduce swelling.  Reduce your risk of vascular disease  Stop smoking. If you would like help call [facility] at 1-800-QUIT-NOW (223-665-2855) or [place] at 403 717 5441.  Manage your cholesterol Maintain a desired weight Control your diabetes weight Control your diabetes Keep your blood  pressure down  If you have any questions, please call the office at [phone]   Prescriptions given: 1.  Roxicet #30 No Refill  Disposition: home  Patient's condition: is Good  Follow up: 1. Dr. Imogene Burn in 2 weeks 2. Dr. Mirna Mires in 1 week to evaluate blood pressure.   Doreatha Massed, PA-C [facility] [phone] 12/19/2016  12:34 PM   Addendum  Rithik Odea is a 66 y.o. ([birth year]) male with  history of complication from an endovascular intervention of occluded left SFA stent at an outside practice.  Based on images, I felt he needed a thrombectomy on the L PFA and a L CFA to BK pop BPG.  The patient had his repeat angiogram to recheck the status of the PFA.  He then had his surgical procedure during the same admission.  Intraoperative findings were remarkable for retrieval of organized thrombus from major branch of PFA.  The left CFA to BK pop BPG was complete with Nigel BridgemanLinton patch adjunct constructed with BPA due to lack of adequate vein.  The patient had successful revascularization of his L leg, as evident by his ABI.  He will follow up in the office in 2 weeks for wound check.   Leonides SakeBrian Arielys Wandersee, MD, FACS [facility] [place] Office: [phone] Pager: [phone]  12/24/2016, 8:13 PM    - For VQI Registry use ---   Post-op:  Wound infection: No  Graft infection: No  Transfusion: No    If yes, n/a units given New Arrhythmia: No Ipsilateral amputation: No, [ ]  Minor, [ ]  BKA, [ ]  AKA Discharge patency: [x ] Primary, [ ]  Primary assisted, [ ]  Secondary, [ ]  Occluded Patency judged by: [ ]  Dopper only, [ ]  Palpable graft pulse, [x]  Palpable distal pulse, [ ]  ABI inc. > 0.15, [ ]  Duplex Discharge ABI: R 0.42, L 0.77 D/C Ambulatory Status: Ambulatory  Complications: MI: No, [ ]  Troponin only, [ ]  EKG or Clinical CHF: No Resp failure:No, [ ]  Pneumonia, [ ]  Ventilator Chg in renal function: No, [ ]  Inc. Cr > 0.5, [ ]  Temp.  Dialysis,  [ ]  Permanent dialysis Stroke: No, [ ]  Minor, [ ]  Major Return to OR: No  Reason for return to OR: [ ]  Bleeding, [ ]  Infection, [ ]  Thrombosis, [ ]  Revision  Discharge medications: Statin use:  yes ASA use:  yes Plavix use:  yes Beta blocker use: no (held) CCB use:  No ACEI use:   no ARB use:  No (held) Coumadin use: no

## 2016-12-18 NOTE — Care Management Note (Addendum)
Case Management Note Donn PieriniKristi Brittanny Levenhagen RN, BSN Unit 4E-Case Manager [phone]  Patient Details  Name: Ryan MedicusLevon Tolin MRN: [id number] Date of Birth: 09/09/1950  Subjective/Objective:   Pt admitted s/p fem-pop bypass graft                 Action/Plan: PTA pt lived at home-plan to return home- per PT eval no recommendations for f/u needs- no noted needs per CM.  Update- 1420- per bedside RN0- Lauren- pt would like RW and 3n1 for home- orders placed and [facility]- 3n1 and RW to be delivered to room prior to discharge- pt also [facility] pre[facility]- orders from the office- notified Tiffany with [facility] of pt's discharge  Expected Discharge Date:  12/18/16               Expected Discharge Plan:  Home/Self Care  In-House Referral:  NA  Discharge planning Services  CM Consult  Post Acute Care Choice:  NA Choice offered to:  NA  DME Arranged:    DME Agency:     HH Arranged:    HH Agency:     Status of Service:  Completed, signed off  If discussed at Long Length of Stay Meetings, dates discussed:    Discharge Disposition: home/self care   Additional Comments:  [name], Jamie Belger Hall, RN 12/18/2016, 11:05 AM

## 2016-12-18 NOTE — Care Management Important Message (Signed)
Important Message  Patient Details  Name: [name] MRN: [id number] Date of Birth: 09/15/1950   Medicare Important Message Given:  Yes    Kyla BalzarineShealy, Erin Uecker Abena 12/18/2016, 10:36 AM

## 2016-12-19 ENCOUNTER — Telehealth: Payer: Self-pay | Admitting: Vascular Surgery

## 2016-12-19 DIAGNOSIS — I10 Essential (primary) hypertension: Secondary | ICD-10-CM | POA: Diagnosis not present

## 2016-12-19 DIAGNOSIS — I739 Peripheral vascular disease, unspecified: Secondary | ICD-10-CM | POA: Diagnosis not present

## 2016-12-19 DIAGNOSIS — Z48812 Encounter for surgical aftercare following surgery on the circulatory system: Secondary | ICD-10-CM | POA: Diagnosis not present

## 2016-12-19 NOTE — Telephone Encounter (Signed)
Sched [facility] appt 01/12/17 at 8:45. Sched PCP 12/25/16 at 9:15. Spoke to pt's wife.

## 2016-12-19 NOTE — Telephone Encounter (Signed)
-----   Message from Sharee PimpleMarilyn K McChesney, RN sent at 12/18/2016 10:16 AM EST ----- Regarding: 2-3 weeks postop Fem-pop BP; needs PCP appt for BP   ----- Message ----- From: Dara Lordshyne, Samantha J, PA-C Sent: 12/18/2016   7:29 AM To: Vvs Charge Pool  S/p left fem to BK pop bypass.  F/u with Dr. Imogene Burnhen in 2-3 weeks.  Please make sure this pt has an appt with his PCP in the next week to evaluate his BP.  He has been instructed to stop taking BP meds and be evaluated by PCP in the next week.  Dr. Mirna MiresGerald Hill [phone].  Thanks

## 2016-12-22 DIAGNOSIS — I739 Peripheral vascular disease, unspecified: Secondary | ICD-10-CM | POA: Diagnosis not present

## 2016-12-22 DIAGNOSIS — Z48812 Encounter for surgical aftercare following surgery on the circulatory system: Secondary | ICD-10-CM | POA: Diagnosis not present

## 2016-12-22 DIAGNOSIS — I10 Essential (primary) hypertension: Secondary | ICD-10-CM | POA: Diagnosis not present

## 2016-12-25 DIAGNOSIS — E118 Type 2 diabetes mellitus with unspecified complications: Secondary | ICD-10-CM | POA: Diagnosis not present

## 2016-12-25 DIAGNOSIS — I10 Essential (primary) hypertension: Secondary | ICD-10-CM | POA: Diagnosis not present

## 2016-12-25 DIAGNOSIS — I739 Peripheral vascular disease, unspecified: Secondary | ICD-10-CM | POA: Diagnosis not present

## 2016-12-26 DIAGNOSIS — I739 Peripheral vascular disease, unspecified: Secondary | ICD-10-CM | POA: Diagnosis not present

## 2016-12-26 DIAGNOSIS — Z48812 Encounter for surgical aftercare following surgery on the circulatory system: Secondary | ICD-10-CM | POA: Diagnosis not present

## 2016-12-26 DIAGNOSIS — I10 Essential (primary) hypertension: Secondary | ICD-10-CM | POA: Diagnosis not present

## 2016-12-28 DIAGNOSIS — I739 Peripheral vascular disease, unspecified: Secondary | ICD-10-CM | POA: Diagnosis not present

## 2016-12-28 DIAGNOSIS — I10 Essential (primary) hypertension: Secondary | ICD-10-CM | POA: Diagnosis not present

## 2016-12-28 DIAGNOSIS — Z48812 Encounter for surgical aftercare following surgery on the circulatory system: Secondary | ICD-10-CM | POA: Diagnosis not present

## 2017-01-02 ENCOUNTER — Other Ambulatory Visit: Payer: Self-pay | Admitting: *Deleted

## 2017-01-02 DIAGNOSIS — I739 Peripheral vascular disease, unspecified: Secondary | ICD-10-CM | POA: Diagnosis not present

## 2017-01-02 DIAGNOSIS — Z48812 Encounter for surgical aftercare following surgery on the circulatory system: Secondary | ICD-10-CM | POA: Diagnosis not present

## 2017-01-02 DIAGNOSIS — I10 Essential (primary) hypertension: Secondary | ICD-10-CM | POA: Diagnosis not present

## 2017-01-02 MED ORDER — OXYCODONE-ACETAMINOPHEN 5-325 MG PO TABS: 1.0000 | ORAL_TABLET | Freq: Three times a day (TID) | ORAL | 0 refills | Status: DC | PRN

## 2017-01-02 NOTE — Progress Notes (Signed)
[facility] Nurse, Tresa MooreKim Pulliam called to report on Mr. Maisie Fushomas, his leg is doing well, post fem-pop on 12-14-16, incisions are CDI, slight leg edema but nurse has encouraged patient to elevate. Patient is afebrile. Out of pain medications that were Rx'd at discharge. I ran [place] database which was appropriate and discussed with Dr. Arbie CookeyEarly, who gave this Percocet refill. Patient has appt with Dr. [name] on 01-12-17 for postop.

## 2017-01-04 DIAGNOSIS — I10 Essential (primary) hypertension: Secondary | ICD-10-CM | POA: Diagnosis not present

## 2017-01-04 DIAGNOSIS — I739 Peripheral vascular disease, unspecified: Secondary | ICD-10-CM | POA: Diagnosis not present

## 2017-01-04 DIAGNOSIS — Z48812 Encounter for surgical aftercare following surgery on the circulatory system: Secondary | ICD-10-CM | POA: Diagnosis not present

## 2017-01-09 NOTE — Progress Notes (Signed)
    Postoperative Visit   History of Present Illness   Ryan [name] is a 66 y.o. year old male who presents for postoperative follow-up for: L CFA to BK pop BPG w/ Propaten (12/14/16).  The patient's wounds are nearly healed.  The patient notes improvement in lower extremity symptoms.  Rest pain is resolved.  The patient is able to complete their activities of daily living.  The patient's current symptoms are: swelling in left calf and numbness in anterior calf.   For VQI Use Only   PRE-ADM LIVING: Home  AMB STATUS: Ambulatory   Physical Examination   Vitals:   01/12/17 0842  BP: 122/72  Pulse: (!) 54  Resp: 16  Temp: (!) 96.9 F (36.1 C)  TempSrc: Oral  SpO2: 100%  Weight: 145 lb (65.8 kg)  Height: 5\' 6"  (1.676 m)    LLE: Incisions are healed, some palpable fluid in Left calf incision,  pedal pulses are strongly dopplerable   Medical Decision Making   Ryan [name] is a 66 y.o. year old male who presents s/p L CFA to BK pop BPG w/ Propaten.   Unclear if patient's residual left leg sx are related to preop ischemic neuropathy.  Discussed with the patient the natural history of peripheral nerve regeneration.  The patient's bypass incisions are healing appropriately with resolution of pre-operative symptoms. I discussed in depth with the patient the nature of atherosclerosis, and emphasized the importance of maximal medical management including strict control of blood pressure, blood glucose, and lipid levels, obtaining regular exercise, and cessation of smoking.  The patient is aware that without maximal medical management the underlying atherosclerotic disease process will progress, limiting the benefit of any interventions. The patient's surveillance will included ABI and bypass duplex studies which will be completed in: 3 months, at which time the patient will be re-evaluated.   I emphasized the importance of routine surveillance of the patient's bypass, as the vascular  surgery literature emphasize the improved patency possible with assisted primary patency procedures versus secondary patency procedures. The patient agrees to participate in their maximal medical care and routine surveillance. Thank you for allowing us to participate in this patient's care.   Leonides SakeBrian Arizona Sorn, MD, FACS [facility] [place] Office: [phone] Pager: [phone]

## 2017-01-11 DIAGNOSIS — I10 Essential (primary) hypertension: Secondary | ICD-10-CM | POA: Diagnosis not present

## 2017-01-11 DIAGNOSIS — I739 Peripheral vascular disease, unspecified: Secondary | ICD-10-CM | POA: Diagnosis not present

## 2017-01-11 DIAGNOSIS — Z48812 Encounter for surgical aftercare following surgery on the circulatory system: Secondary | ICD-10-CM | POA: Diagnosis not present

## 2017-01-12 ENCOUNTER — Ambulatory Visit (INDEPENDENT_AMBULATORY_CARE_PROVIDER_SITE_OTHER): Payer: Medicare Other | Admitting: Vascular Surgery

## 2017-01-12 ENCOUNTER — Other Ambulatory Visit: Payer: Self-pay

## 2017-01-12 ENCOUNTER — Encounter: Payer: Self-pay | Admitting: Vascular Surgery

## 2017-01-12 VITALS — BP 122/72 | HR 54 | Temp 96.9°F | Resp 16 | Ht 66.0 in | Wt 145.0 lb

## 2017-01-12 DIAGNOSIS — I70229 Atherosclerosis of native arteries of extremities with rest pain, unspecified extremity: Secondary | ICD-10-CM

## 2017-01-12 DIAGNOSIS — I998 Other disorder of circulatory system: Secondary | ICD-10-CM

## 2017-01-22 NOTE — Addendum Note (Signed)
Addended by: Burton ApleyPETTY, Amogh Komatsu A on: 01/22/2017 11:56 AM   Modules accepted: Orders

## 2017-04-09 NOTE — Progress Notes (Signed)
Established Previous Bypass   History of Present Illness   Ryan [name] is a 67 y.o. (10/20/1950) male who presents with chief complaint: none.  Previous operation(s) include: L CFA to BK pop BPG w/ Propaten, L PFA TE (12/14/16).  This procedure was completed for left leg rest pain related to complications from Cardiology attempt to revascularization left SFA stents.  The patient's symptoms have not progressed.  The patient's symptoms are: left foot numbness. The patient's treatment regimen currently included: maximal medical management.    The patient's PMH, PSH, SH, and FamHx are unchanged from 01/12/17.  Current Outpatient Medications  Medication Sig Dispense Refill  . aspirin 81 MG tablet Take 81 mg by mouth daily.    . clopidogrel (PLAVIX) 75 MG tablet Take 1 tablet (75 mg total) by mouth daily. 30 tablet 6  . feeding supplement, ENSURE ENLIVE, (ENSURE ENLIVE) LIQD Take 237 mLs 2 (two) times daily between meals by mouth. 237 mL 12  . nitroGLYCERIN (NITRODUR - DOSED IN MG/24 HR) 0.2 mg/hr patch Place 1 patch (0.2 mg total) onto the skin daily. 30 patch 1  . oxyCODONE-acetaminophen (PERCOCET/ROXICET) 5-325 MG tablet Take 1 tablet by mouth every 8 (eight) hours as needed for moderate pain or severe pain. 30 tablet 0  . rosuvastatin (CRESTOR) 20 MG tablet Take 20 mg by mouth daily.  1   No current facility-administered medications for this visit.     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  General Alert, O x 3, WD, NAD  Pulmonary Sym exp, good B air movt, CTA B  Cardiac RRR, Nl S1, S2, no Murmurs, No rubs, No S3,S4  Vascular Vessel Right Left  Radial Palpable Palpable  Brachial Palpable Palpable  Carotid Palpable, No Bruit Palpable, No Bruit  Aorta Not  palpable N/A  Femoral Palpable Palpable  Popliteal Not palpable Not palpable  PT Not palpable Palpable  DP Not palpable Palpable    Gastro- intestinal soft, non-distended, non-tender to palpation, No guarding or rebound, no HSM, no masses, no CVAT B, No palpable prominent aortic pulse,    Musculo- skeletal M/S 5/5 throughout  , Extremities without ischemic changes  , No edema present, No visible varicosities , No Lipodermatosclerosis present, healed calf incision  Neurologic Pain and light touch intact in extremities , Motor exam as listed above    Non-invasive Vascular Imaging   ABI (04/11/2017)  R:   ABI: 0.61 (0.42),   PT: mono  DP: mono  TBI:  0.28  L:   ABI: 113 (0.77),   PT: tri  DP: tri  TBI: 0.71   Bypass Duplex (04/11/2017)  Proximal to bypass: 75 c/s  With-in bypass: 59-143 c/s  Distal to bypass: 71 c/s  Triphasic throughout   Medical Decision Making   Ryan [name] is a 67 y.o. male who presents with: s/p L CFA to BK pop BPG w/ Propaten for L LE rest pain due to embolization during cardiology attempt to revascularization occluded left SFA stents.   Based on the patient's vascular studies and examination, I have offered the patient: BLE ABI and bypass duplex in 3 months.  I discussed in depth with the patient the nature of atherosclerosis, and  emphasized the importance of maximal medical management including strict control of blood pressure, blood glucose, and lipid levels, obtaining regular exercise, and cessation of smoking.    The patient is aware that without maximal medical management the underlying atherosclerotic disease process will progress, limiting the benefit of any interventions.  I discussed in depth with the patient the need for long term surveillance to improve the primary assisted patency of his bypass.  The patient agrees to cooperate with such. The patient is currently on a statin: Crestor.  The patient is currently on an  anti-platelet: ASA and Plavix.  Thank you for allowing [place] to participate in this patient's care.   Leonides Sake, MD, FACS [facility] [place] Office: [phone] Pager: [phone]

## 2017-04-11 ENCOUNTER — Ambulatory Visit (INDEPENDENT_AMBULATORY_CARE_PROVIDER_SITE_OTHER): Payer: Medicare Other | Admitting: Vascular Surgery

## 2017-04-11 ENCOUNTER — Ambulatory Visit (HOSPITAL_COMMUNITY)
Admission: RE | Admit: 2017-04-11 | Discharge: 2017-04-11 | Disposition: A | Discharge status: Home / Self Care | Payer: Medicare Other | Source: Ambulatory Visit | Attending: Vascular Surgery | Admitting: Vascular Surgery

## 2017-04-11 ENCOUNTER — Other Ambulatory Visit: Payer: Self-pay

## 2017-04-11 ENCOUNTER — Encounter: Payer: Self-pay | Admitting: Vascular Surgery

## 2017-04-11 ENCOUNTER — Ambulatory Visit (INDEPENDENT_AMBULATORY_CARE_PROVIDER_SITE_OTHER)
Admission: RE | Admit: 2017-04-11 | Discharge: 2017-04-11 | Disposition: A | Discharge status: Home / Self Care | Payer: Medicare Other | Source: Ambulatory Visit | Attending: Vascular Surgery | Admitting: Vascular Surgery

## 2017-04-11 VITALS — BP 126/70 | HR 54 | Resp 20 | Ht 66.0 in | Wt 147.0 lb

## 2017-04-11 DIAGNOSIS — I70229 Atherosclerosis of native arteries of extremities with rest pain, unspecified extremity: Secondary | ICD-10-CM

## 2017-04-11 DIAGNOSIS — I998 Other disorder of circulatory system: Secondary | ICD-10-CM

## 2017-04-11 DIAGNOSIS — I771 Stricture of artery: Secondary | ICD-10-CM | POA: Insufficient documentation

## 2017-04-12 ENCOUNTER — Other Ambulatory Visit: Payer: Self-pay

## 2017-04-12 DIAGNOSIS — I998 Other disorder of circulatory system: Secondary | ICD-10-CM

## 2017-04-12 DIAGNOSIS — I739 Peripheral vascular disease, unspecified: Secondary | ICD-10-CM

## 2017-04-12 DIAGNOSIS — I70229 Atherosclerosis of native arteries of extremities with rest pain, unspecified extremity: Secondary | ICD-10-CM

## 2017-05-30 DIAGNOSIS — I739 Peripheral vascular disease, unspecified: Secondary | ICD-10-CM | POA: Diagnosis not present

## 2017-05-30 DIAGNOSIS — I10 Essential (primary) hypertension: Secondary | ICD-10-CM | POA: Diagnosis not present

## 2017-05-30 DIAGNOSIS — I7389 Other specified peripheral vascular diseases: Secondary | ICD-10-CM | POA: Diagnosis not present

## 2017-05-30 DIAGNOSIS — E118 Type 2 diabetes mellitus with unspecified complications: Secondary | ICD-10-CM | POA: Diagnosis not present

## 2017-06-04 DIAGNOSIS — I10 Essential (primary) hypertension: Secondary | ICD-10-CM | POA: Diagnosis not present

## 2017-07-17 NOTE — Progress Notes (Signed)
Established Previous Bypass   History of Present Illness   Ryan [name] is a 67 y.o. (26-Oct-1950) male who presents with chief complaint: numbness in R great toe.    Prior procedures include: 1. L CFA to BK pop BPG w/ Propaten, L PFA TE (12/14/16) for rest pain related to complications from Cardiology attempt to revascularize occluded L SFA stents.   The patient's symptoms have NOT progressed.  The patient had some numbness in the territory of the popliteal incision post-operatively which resolved.  He has had onset of the last few months of Left great toe numbness spreading in the medial forefoot.  The patient's treatment regimen currently included: maximal medical management.  The patient denies any significant sx in right foot  The patient's PMH, PSH, SH, and FamHx were reviewed on 07/18/17 are unchanged from 04/11/17.  Current Outpatient Medications  Medication Sig Dispense Refill  . aspirin 81 MG tablet Take 81 mg by mouth daily.    . clopidogrel (PLAVIX) 75 MG tablet Take 1 tablet (75 mg total) by mouth daily. 30 tablet 6  . feeding supplement, ENSURE ENLIVE, (ENSURE ENLIVE) LIQD Take 237 mLs 2 (two) times daily between meals by mouth. (Patient not taking: Reported on 04/11/2017) 237 mL 12  . nitroGLYCERIN (NITRODUR - DOSED IN MG/24 HR) 0.2 mg/hr patch Place 1 patch (0.2 mg total) onto the skin daily. 30 patch 1  . oxyCODONE-acetaminophen (PERCOCET/ROXICET) 5-325 MG tablet Take 1 tablet by mouth every 8 (eight) hours as needed for moderate pain or severe pain. (Patient not taking: Reported on 04/11/2017) 30 tablet 0  . rosuvastatin (CRESTOR) 20 MG tablet Take 20 mg by mouth daily.  1   No current facility-administered medications for this visit.     On ROS today: no gangrene, no ulcers, no intermittent claudication    Physical Examination   Vitals:   07/18/17 1038  BP: 130/77  Pulse: (!) 53  Resp: 16  Temp: (!) 97.2 F (36.2 C)  TempSrc: Oral  SpO2: 100%  Weight: 147 lb  4.8 oz (66.8 kg)   Body mass index is 23.77 kg/m.  General Alert, O x 3, WD, NAD  Pulmonary Sym exp, good B air movt, CTA B  Cardiac RRR, Nl S1, S2, no Murmurs, No rubs, No S3,S4  Vascular Vessel Right Left  Radial Palpable Palpable  Brachial Palpable Palpable  Carotid Palpable, No Bruit Palpable, No Bruit  Aorta Not palpable N/A  Femoral Palpable Palpable  Popliteal Not palpable Not palpable  PT Not palpable Faintly palpable  DP Not palpable Palpable    Gastro- intestinal soft, non-distended, non-tender to palpation, No guarding or rebound, no HSM, no masses, no CVAT B, No palpable prominent aortic pulse,    Musculo- skeletal M/S 5/5 throughout  , Extremities without ischemic changes  , No edema present, No visible varicosities , No Lipodermatosclerosis present  Neurologic Pain and light touch intact in extremities except L great toe , Motor exam as listed above    Non-invasive Vascular Imaging   ABI (07/18/2017)  R:   ABI: 0.50 (0.61),   PT: mono  DP: mono  TBI:  0.39  L:   ABI: 1.11 (1.13),   PT: tri  DP: bi  TBI: 0.86  Bypass Duplex (07/18/2017)  Proximal to bypass: 76 c/s  With-in bypass: 50-75 c/s  Distal to bypass: 91 c/s  Widely patent bypass   Medical Decision Making   Ryan [name] is a 67 y.o. male who presents with: s/p  L CFA to BK pop BPG w/ Propaten for LLE rest pain due to embolization during cardiology attempt to revascularize occluded L SFA stents.   Based on the patient's vascular studies and examination, I have offered the patient: BLE ABI and bypass duplex in 6 months.  I discussed in depth with the patient the nature of atherosclerosis, and emphasized the importance of maximal medical management including strict control of blood pressure, blood glucose, and lipid levels, obtaining regular exercise, and cessation of smoking.    The patient is aware that without maximal medical management the underlying atherosclerotic disease  process will progress, limiting the benefit of any interventions.  I discussed in depth with the patient the need for long term surveillance to improve the primary assisted patency of his bypass.  The patient agrees to cooperate with such.  The patient is currently on a statin: Crestor.   The patient is currently on an anti-platelet: ASA and Plavix.  Thank you for allowing us to participate in this patient's care.   Ryan SakeBrian Chen, MD, FACS [facility] [place] Office: [phone] Pager: [phone]

## 2017-07-18 ENCOUNTER — Ambulatory Visit (INDEPENDENT_AMBULATORY_CARE_PROVIDER_SITE_OTHER)
Admission: RE | Admit: 2017-07-18 | Discharge: 2017-07-18 | Disposition: A | Discharge status: Home / Self Care | Payer: Medicare Other | Source: Ambulatory Visit | Attending: Vascular Surgery | Admitting: Vascular Surgery

## 2017-07-18 ENCOUNTER — Encounter: Payer: Self-pay | Admitting: Vascular Surgery

## 2017-07-18 ENCOUNTER — Other Ambulatory Visit: Payer: Self-pay

## 2017-07-18 ENCOUNTER — Ambulatory Visit (HOSPITAL_COMMUNITY)
Admission: RE | Admit: 2017-07-18 | Discharge: 2017-07-18 | Disposition: A | Discharge status: Home / Self Care | Payer: Medicare Other | Source: Ambulatory Visit | Attending: Vascular Surgery | Admitting: Vascular Surgery

## 2017-07-18 ENCOUNTER — Ambulatory Visit (INDEPENDENT_AMBULATORY_CARE_PROVIDER_SITE_OTHER): Payer: Medicare Other | Admitting: Vascular Surgery

## 2017-07-18 VITALS — BP 130/77 | HR 53 | Temp 97.2°F | Resp 16 | Wt 147.3 lb

## 2017-07-18 DIAGNOSIS — Z95828 Presence of other vascular implants and grafts: Secondary | ICD-10-CM | POA: Diagnosis not present

## 2017-07-18 DIAGNOSIS — I70229 Atherosclerosis of native arteries of extremities with rest pain, unspecified extremity: Secondary | ICD-10-CM

## 2017-07-18 DIAGNOSIS — I998 Other disorder of circulatory system: Secondary | ICD-10-CM

## 2017-07-18 DIAGNOSIS — I739 Peripheral vascular disease, unspecified: Secondary | ICD-10-CM

## 2017-07-20 ENCOUNTER — Other Ambulatory Visit: Payer: Self-pay

## 2017-07-20 DIAGNOSIS — I739 Peripheral vascular disease, unspecified: Secondary | ICD-10-CM

## 2017-07-20 DIAGNOSIS — I70229 Atherosclerosis of native arteries of extremities with rest pain, unspecified extremity: Secondary | ICD-10-CM

## 2017-07-20 DIAGNOSIS — I998 Other disorder of circulatory system: Secondary | ICD-10-CM

## 2017-07-30 DIAGNOSIS — I10 Essential (primary) hypertension: Secondary | ICD-10-CM | POA: Diagnosis not present

## 2017-07-30 DIAGNOSIS — I739 Peripheral vascular disease, unspecified: Secondary | ICD-10-CM | POA: Diagnosis not present

## 2017-09-03 DIAGNOSIS — Z0000 Encounter for general adult medical examination without abnormal findings: Secondary | ICD-10-CM | POA: Diagnosis not present

## 2017-12-19 ENCOUNTER — Other Ambulatory Visit: Payer: Self-pay

## 2018-01-03 ENCOUNTER — Encounter: Payer: Self-pay | Admitting: Vascular Surgery

## 2018-01-03 ENCOUNTER — Ambulatory Visit (INDEPENDENT_AMBULATORY_CARE_PROVIDER_SITE_OTHER): Payer: Medicare Other | Admitting: Family

## 2018-01-03 ENCOUNTER — Ambulatory Visit (INDEPENDENT_AMBULATORY_CARE_PROVIDER_SITE_OTHER)
Admission: RE | Admit: 2018-01-03 | Discharge: 2018-01-03 | Disposition: A | Discharge status: Home / Self Care | Payer: Medicare Other | Source: Ambulatory Visit | Attending: Family Medicine | Admitting: Family Medicine

## 2018-01-03 ENCOUNTER — Ambulatory Visit (HOSPITAL_COMMUNITY)
Admission: RE | Admit: 2018-01-03 | Discharge: 2018-01-03 | Disposition: A | Discharge status: Home / Self Care | Payer: Medicare Other | Source: Ambulatory Visit | Attending: Family Medicine | Admitting: Family Medicine

## 2018-01-03 ENCOUNTER — Encounter: Payer: Self-pay | Admitting: Family

## 2018-01-03 ENCOUNTER — Other Ambulatory Visit: Payer: Self-pay | Admitting: Vascular Surgery

## 2018-01-03 VITALS — BP 199/86 | HR 54 | Temp 97.2°F | Resp 12 | Ht 66.0 in | Wt 155.0 lb

## 2018-01-03 DIAGNOSIS — E785 Hyperlipidemia, unspecified: Secondary | ICD-10-CM | POA: Diagnosis not present

## 2018-01-03 DIAGNOSIS — I739 Peripheral vascular disease, unspecified: Secondary | ICD-10-CM

## 2018-01-03 DIAGNOSIS — T82898A Other specified complication of vascular prosthetic devices, implants and grafts, initial encounter: Secondary | ICD-10-CM

## 2018-01-03 DIAGNOSIS — I998 Other disorder of circulatory system: Secondary | ICD-10-CM | POA: Insufficient documentation

## 2018-01-03 DIAGNOSIS — I70229 Atherosclerosis of native arteries of extremities with rest pain, unspecified extremity: Secondary | ICD-10-CM

## 2018-01-03 DIAGNOSIS — I10 Essential (primary) hypertension: Secondary | ICD-10-CM | POA: Diagnosis not present

## 2018-01-03 DIAGNOSIS — Z6828 Body mass index (BMI) 28.0-28.9, adult: Secondary | ICD-10-CM | POA: Diagnosis not present

## 2018-01-03 DIAGNOSIS — R001 Bradycardia, unspecified: Secondary | ICD-10-CM | POA: Diagnosis not present

## 2018-01-03 DIAGNOSIS — F17200 Nicotine dependence, unspecified, uncomplicated: Secondary | ICD-10-CM | POA: Diagnosis not present

## 2018-01-03 DIAGNOSIS — I779 Disorder of arteries and arterioles, unspecified: Secondary | ICD-10-CM | POA: Diagnosis not present

## 2018-01-03 DIAGNOSIS — F17290 Nicotine dependence, other tobacco product, uncomplicated: Secondary | ICD-10-CM | POA: Diagnosis not present

## 2018-01-03 DIAGNOSIS — Z716 Tobacco abuse counseling: Secondary | ICD-10-CM | POA: Diagnosis not present

## 2018-01-03 DIAGNOSIS — E119 Type 2 diabetes mellitus without complications: Secondary | ICD-10-CM | POA: Diagnosis not present

## 2018-01-03 NOTE — Patient Instructions (Addendum)
Steps to Quit Smoking Smoking tobacco can be bad for your health. It can also affect almost every organ in your body. Smoking puts you and people around you at risk for many serious long-lasting (chronic) diseases. Quitting smoking is hard, but it is one of the best things that you can do for your health. It is never too late to quit. What are the benefits of quitting smoking? When you quit smoking, you lower your risk for getting serious diseases and conditions. They can include:  Lung cancer or lung disease.  Heart disease.  Stroke.  Heart attack.  Not being able to have children (infertility).  Weak bones (osteoporosis) and broken bones (fractures).  If you have coughing, wheezing, and shortness of breath, those symptoms may get better when you quit. You may also get sick less often. If you are pregnant, quitting smoking can help to lower your chances of having a baby of low birth weight. What can I do to help me quit smoking? Talk with your doctor about what can help you quit smoking. Some things you can do (strategies) include:  Quitting smoking totally, instead of slowly cutting back how much you smoke over a period of time.  Going to in-person counseling. You are more likely to quit if you go to many counseling sessions.  Using resources and support systems, such as: ? Online chats with a counselor. ? Phone quitlines. ? Printed self-help materials. ? Support groups or group counseling. ? Text messaging programs. ? Mobile phone apps or applications.  Taking medicines. Some of these medicines may have nicotine in them. If you are pregnant or breastfeeding, do not take any medicines to quit smoking unless your doctor says it is okay. Talk with your doctor about counseling or other things that can help you.  Talk with your doctor about using more than one strategy at the same time, such as taking medicines while you are also going to in-person counseling. This can help make  quitting easier. What things can I do to make it easier to quit? Quitting smoking might feel very hard at first, but there is a lot that you can do to make it easier. Take these steps:  Talk to your family and friends. Ask them to support and encourage you.  Call phone quitlines, reach out to support groups, or work with a counselor.  Ask people who smoke to not smoke around you.  Avoid places that make you want (trigger) to smoke, such as: ? Bars. ? Parties. ? Smoke-break areas at work.  Spend time with people who do not smoke.  Lower the stress in your life. Stress can make you want to smoke. Try these things to help your stress: ? Getting regular exercise. ? Deep-breathing exercises. ? Yoga. ? Meditating. ? Doing a body scan. To do this, close your eyes, focus on one area of your body at a time from head to toe, and notice which parts of your body are tense. Try to relax the muscles in those areas.  Download or buy apps on your mobile phone or tablet that can help you stick to your quit plan. There are many free apps, such as QuitGuide from the CDC (Centers for Disease Control and Prevention). You can find more support from smokefree.gov and other websites.  This information is not intended to replace advice given to you by your health care provider. Make sure you discuss any questions you have with your health care provider. Document Released: 11/12/2008 Document   Revised: 09/14/2015 Document Reviewed: 06/02/2014 Elsevier Interactive Patient Education  2018 Elsevier Inc.     Peripheral Vascular Disease Peripheral vascular disease (PVD) is a disease of the blood vessels that are not part of your heart and brain. A simple term for PVD is poor circulation. In most cases, PVD narrows the blood vessels that carry blood from your heart to the rest of your body. This can result in a decreased supply of blood to your arms, legs, and internal organs, like your stomach or kidneys.  However, it most often affects a person's lower legs and feet. There are two types of PVD.  Organic PVD. This is the more common type. It is caused by damage to the structure of blood vessels.  Functional PVD. This is caused by conditions that make blood vessels contract and tighten (spasm).  Without treatment, PVD tends to get worse over time. PVD can also lead to acute ischemic limb. This is when an arm or limb suddenly has trouble getting enough blood. This is a medical emergency. Follow these instructions at home:  Take medicines only as told by your doctor.  Do not use any tobacco products, including cigarettes, chewing tobacco, or electronic cigarettes. If you need help quitting, ask your doctor.  Lose weight if you are overweight, and maintain a healthy weight as told by your doctor.  Eat a diet that is low in fat and cholesterol. If you need help, ask your doctor.  Exercise regularly. Ask your doctor for some good activities for you.  Take good care of your feet. ? Wear comfortable shoes that fit well. ? Check your feet often for any cuts or sores. Contact a doctor if:  You have cramps in your legs while walking.  You have leg pain when you are at rest.  You have coldness in a leg or foot.  Your skin changes.  You are unable to get or have an erection (erectile dysfunction).  You have cuts or sores on your feet that are not healing. Get help right away if:  Your arm or leg turns cold and blue.  Your arms or legs become red, warm, swollen, painful, or numb.  You have chest pain or trouble breathing.  You suddenly have weakness in your face, arm, or leg.  You become very confused or you cannot speak.  You suddenly have a very bad headache.  You suddenly cannot see. This information is not intended to replace advice given to you by your health care provider. Make sure you discuss any questions you have with your health care provider. Document Released:  04/12/2009 Document Revised: 06/24/2015 Document Reviewed: 06/26/2013 Elsevier Interactive Patient Education  2017 Elsevier Inc.  

## 2018-01-03 NOTE — Progress Notes (Signed)
[facility] [place]   CC: Follow up peripheral artery occlusive disease  History of Present Illness 1. Ryan [name] is a 67 y.o. male who is s/p L CFA to BK pop BPG w/ Propaten, L PFA TE (12/14/16) by Dr. Imogene Burnhen for rest pain related to complications from Cardiology attempt to revascularize occluded L SFA stents.   Wife states right leg vascular procedure was done at [facility] about 2015 after he fractured his right femur.   Pt wife states pt left foot started feeling worse with walking in the last 2 months. He has worsening numbness in his left foot with walking, and walking further his left calf feels numb.  He has no rest pain, and no pain in his left leg unless he walks about 400 feet.   He denies chest pain or dyspnea, denies headache.   Pt denies any known history of stroke or TIA.  He denies any known cardiac problems.    Diabetic: No Tobacco use: smoker  (1 ppd x 49 yrs)  Pt meds include: Statin :Yes Betablocker: No ASA: Yes Other anticoagulants/antiplatelets: Plavix  Past Medical History:  Diagnosis Date  . Hypertension   . PVD (peripheral vascular disease) (HCC)     Social History Social History   Tobacco Use  . Smoking status: Current Every Day Smoker    Packs/day: 1.00    Years: 49.00    Pack years: 49.00    Types: Cigarettes    Last attempt to quit: 11/10/2016    Years since quitting: 1.1  . Smokeless tobacco: Never Used  Substance Use Topics  . Alcohol use: Yes    Alcohol/week: 11.0 standard drinks    Types: 11 Shots of liquor per week    Comment: 12/13/2016 "1 pint whiskey/wk"  . Drug use: No    Family History History reviewed. No pertinent family history.  Past Surgical History:  Procedure Laterality Date  . ABDOMINAL AORTAGRAM Left 12/13/2016   ABDOMINAL AORTOGRAM W/LOWER EXTREMITY/notes 12/13/2016  . ABDOMINAL AORTOGRAM W/LOWER EXTREMITY N/A 11/28/2016   Procedure: ABDOMINAL AORTOGRAM W/LOWER EXTREMITY;  Surgeon:  Yates DecampGanji, Jay, MD;  Location: [facility] INVASIVE [facility];  Service: Cardiovascular;  Laterality: N/A;  bilateral  . ABDOMINAL AORTOGRAM W/LOWER EXTREMITY N/A 12/13/2016   Procedure: ABDOMINAL AORTOGRAM W/LOWER EXTREMITY;  Surgeon: Maeola Harmanain, Brandon Christopher, MD;  [facility];  Service: Cardiovascular;  Laterality: N/A;  unilater left leg  . FEMORAL-POPLITEAL BYPASS GRAFT Left 12/14/2016   Procedure: BYPASS GRAFT COMMON FEMORAL- BELOW KNEE POPLITEAL ARTERY with Bovine Patch to Below the knee poplitieal artery.;  Surgeon: Fransisco Hertzhen, Brian L, MD;  [facility];  Service: Vascular;  Laterality: Left;  . LOWER EXTREMITY ANGIOGRAPHY Left 11/28/2016   Procedure: Lower Extremity Angiography;  Surgeon: Yates DecampGanji, Jay, MD;  [facility];  Service: Cardiovascular;  Laterality: Left;  lysis followup lower leg  . PERIPHERAL VASCULAR BALLOON ANGIOPLASTY  11/28/2016   Procedure: PERIPHERAL VASCULAR BALLOON ANGIOPLASTY;  Surgeon: Yates DecampGanji, Jay, MD;  Location: [facility] INVAS[facility] CV [facility];  Service: Cardiovascular;;  SFA  . PERIPHERAL VASCULAR CATHETERIZATION N/A 08/11/2014   Procedure: Lower Extremity Angiography;  Surgeon: Yates DecampJay Ganji, MD;  [facility];  Service: Cardiovascular;  Laterality: N/A;  . PERIPHERAL VASCULAR THROMBECTOMY  11/28/2016   Procedure: PERIPHERAL VASCULAR THROMBECTOMY;  Surgeon: Yates DecampGanji, Jay, MD;  Location: [facility];  Service: Cardiovascular;;  Profunda  . PERIPHERAL VASCULAR THROMBECTOMY  11/28/2016   Procedure: PERIPHERAL VASCULAR THROMBECTOMY;  Surgeon: Yates DecampGanji, Jay, MD;  Location: [facility];  Service: Cardiovascular;;  . THROMBECTOMY FEMORAL ARTERY Left 12/14/2016   Procedure: THROMBECTOMY PROFUNDA FEMORAL ARTERY;  Surgeon: Fransisco Hertz, MD;  Location: [facility] OR;  Service: Vascular;  Laterality: Left;    No Known Allergies  Current Outpatient Medications  Medication Sig Dispense Refill  . aspirin 81 MG tablet Take 81 mg by mouth daily.    . clopidogrel  (PLAVIX) 75 MG tablet Take 1 tablet (75 mg total) by mouth daily. 30 tablet 6  . feeding supplement, ENSURE ENLIVE, (ENSURE ENLIVE) LIQD Take 237 mLs 2 (two) times daily between meals by mouth. 237 mL 12  . nitroGLYCERIN (NITRODUR - DOSED IN MG/24 HR) 0.2 mg/hr patch Place 1 patch (0.2 mg total) onto the skin daily. 30 patch 1  . oxyCODONE-acetaminophen (PERCOCET/ROXICET) 5-325 MG tablet Take 1 tablet by mouth every 8 (eight) hours as needed for moderate pain or severe pain. 30 tablet 0  . rosuvastatin (CRESTOR) 20 MG tablet Take 20 mg by mouth daily.  1   No current facility-administered medications for this visit.     ROS: See HPI for pertinent positives and negatives.   Physical Examination  Vitals:   01/03/18 0941  BP: (!) 199/86  Pulse: (!) 54  Resp: 12  Temp: (!) 97.2 F (36.2 C)  SpO2: 97%  Weight: 155 lb (70.3 kg)  Height: 5\' 6"  (1.676 m)   Body mass index is 25.02 kg/m.  General: A&O x 3, WDWN, male. Gait: normal HENT: No gross abnormalities.Edentulous.  Eyes: PERRLA. Pulmonary: Respirations are non labored, CTAB, fair air movement in all fields Cardiac: regular rhythm, no detected murmur.         Carotid Bruits Right Left   Negative Negative   Radial pulses are 2+ palpable bilaterally   Adominal aortic pulse is not palpable                         VASCULAR EXAM: Extremities with ischemic changes, without Gangrene; without open wounds. Left forefoot and toes are more dusky than right.                                                                                                                 LE Pulses Right Left       FEMORAL  2+ palpable  2+ palpable        POPLITEAL  not palpable   not palpable       POSTERIOR TIBIAL  not palpable   not palpable        DORSALIS PEDIS      ANTERIOR TIBIAL not palpable  not palpable    Abdomen: soft, NT, no palpable masses. Skin: no rashes, no cellulitis, no ulcers noted. Musculoskeletal: no muscle wasting  or atrophy.  Neurologic: A&O X 3; appropriate affect, Sensation is normal; MOTOR FUNCTION:  moving all extremities equally, motor strength 5/5 throughout. Speech is fluent/normal. CN 2-12 intact. Psychiatric: Thought content is normal, mood appropriate for clinical situation.  ASSESSMENT: Ryan [name] is a 67 y.o. male who is s/p L CFA to BK pop BPG w/ Propaten for LLE rest pain due to embolization during cardiology attempt to revascularize occluded L SFA stents.  In the last 2 months his left foot has felt numb, this is not worsening. He does not seem to have rest pain. When he walks the numbness extends up to his left calf, and left calf will hurt after walking about 400 feet. There are no ulcers in his lower extremities. Occluded left femoral to below the knee bypass graft.   Unfortunately he continues to smoke 1 ppd, fortunately he does not have DM. He takes a daily statin, 81 mg ASA, and Plavix.   Asymptomatic elevated blood pressure now: Pt has not yet taken his morning medications. I advised him to take his morning medications ASAP, wait and hour, check his blood pressure (states he has a blood pressure monitor at home), and to notify his PCP if his blood pressure remains greater than 160/90.    DATA  Left LE Arterial Duplex (01-03-18): Occluded left femoral to below the knee bypass graft.  ABI (Date: 01/03/2018):  R:   ABI: 0.63 (was 0.50 on 07-18-17),   PT: mono  DP: mono  TBI:  0.44, toe pressure 88, (was 0.39)  L:   ABI: 0.41 (was 1.11),   PT: mono  DP: mono  TBI: unable to obtain, (was 0.86) Slightly improved right ABI with moderate disease and monophasic waveforms. Significant decline in left ABI from 100% to 41%, monophasic waveforms.     PLAN:  Based on the patient's vascular studies and examination, and after discussing with Dr. Darrick Penna, pt will be scheduled for an aortogram with run off, possible intervention, for left foot worsening numbness in the  last 2 months, dusky appearance of left forefoot. Will schedule for 01-09-18, Dr. Chestine Spore.  Over 3 minutes was spent counseling patient re smoking cessation, and patient was given several free resources re smoking cessation.   I discussed in depth with the patient the nature of atherosclerosis, and emphasized the importance of maximal medical management including strict control of blood pressure, blood glucose, and lipid levels, obtaining regular exercise, and cessation of smoking.  The patient is aware that without maximal medical management the underlying atherosclerotic disease process will progress, limiting the benefit of any interventions.  The patient was given information about PAD including signs, symptoms, treatment, what symptoms should prompt the patient to seek immediate medical care, and risk reduction measures to take.  Charisse March, RN, MSN, FNP-C [facility] [place] Phone: [phone]  Clinic MD: [facility]  01/03/18 9:52 AM

## 2018-01-09 ENCOUNTER — Ambulatory Visit (HOSPITAL_COMMUNITY)
Admission: RE | Admit: 2018-01-09 | Discharge: 2018-01-09 | Disposition: A | Discharge status: Home / Self Care | Payer: Medicare Other | Source: Ambulatory Visit | Attending: Vascular Surgery | Admitting: Vascular Surgery

## 2018-01-09 ENCOUNTER — Encounter (HOSPITAL_COMMUNITY)
Admission: RE | Disposition: A | Discharge status: Home / Self Care | Payer: Self-pay | Source: Ambulatory Visit | Attending: Vascular Surgery

## 2018-01-09 ENCOUNTER — Other Ambulatory Visit: Payer: Self-pay

## 2018-01-09 DIAGNOSIS — Z7982 Long term (current) use of aspirin: Secondary | ICD-10-CM | POA: Diagnosis not present

## 2018-01-09 DIAGNOSIS — Z955 Presence of coronary angioplasty implant and graft: Secondary | ICD-10-CM | POA: Insufficient documentation

## 2018-01-09 DIAGNOSIS — Z95820 Peripheral vascular angioplasty status with implants and grafts: Secondary | ICD-10-CM | POA: Diagnosis not present

## 2018-01-09 DIAGNOSIS — Z7902 Long term (current) use of antithrombotics/antiplatelets: Secondary | ICD-10-CM | POA: Insufficient documentation

## 2018-01-09 DIAGNOSIS — F17210 Nicotine dependence, cigarettes, uncomplicated: Secondary | ICD-10-CM | POA: Diagnosis not present

## 2018-01-09 DIAGNOSIS — I70212 Atherosclerosis of native arteries of extremities with intermittent claudication, left leg: Secondary | ICD-10-CM | POA: Diagnosis not present

## 2018-01-09 DIAGNOSIS — I10 Essential (primary) hypertension: Secondary | ICD-10-CM | POA: Insufficient documentation

## 2018-01-09 DIAGNOSIS — Z79899 Other long term (current) drug therapy: Secondary | ICD-10-CM | POA: Diagnosis not present

## 2018-01-09 DIAGNOSIS — I771 Stricture of artery: Secondary | ICD-10-CM | POA: Diagnosis not present

## 2018-01-09 HISTORY — PX: ABDOMINAL AORTOGRAM W/LOWER EXTREMITY: CATH118223

## 2018-01-09 LAB — BASIC METABOLIC PANEL
Anion gap: 10 (ref 5–15)
BUN: 31 mg/dL — ABNORMAL HIGH (ref 8–23)
CO2: 25 mmol/L (ref 22–32)
Calcium: 10.3 mg/dL (ref 8.9–10.3)
Chloride: 102 mmol/L (ref 98–111)
Creatinine, Ser: 1.47 mg/dL — ABNORMAL HIGH (ref 0.61–1.24)
GFR calc Af Amer: 56 mL/min — ABNORMAL LOW (ref >60)
GFR calc non Af Amer: 48 mL/min — ABNORMAL LOW (ref >60)
Glucose, Bld: 127 mg/dL — ABNORMAL HIGH (ref 70–99)
Potassium: 3.9 mmol/L (ref 3.5–5.1)
Sodium: 137 mmol/L (ref 135–145)

## 2018-01-09 SURGERY — ABDOMINAL AORTOGRAM W/LOWER EXTREMITY
Anesthesia: LOCAL

## 2018-01-09 MED ORDER — SODIUM CHLORIDE 0.9% FLUSH: 3.0000 mL | INTRAVENOUS | Status: DC | PRN

## 2018-01-09 MED ORDER — LIDOCAINE HCL (PF) 1 % IJ SOLN
INTRAMUSCULAR | Status: DC | PRN
  Administered 2018-01-09: 15 mL via INTRADERMAL

## 2018-01-09 MED ORDER — HEPARIN (PORCINE) IN NACL 1000-0.9 UT/500ML-% IV SOLN
INTRAVENOUS | Status: AC
  Filled 2018-01-09: qty 1000

## 2018-01-09 MED ORDER — ONDANSETRON HCL 4 MG/2ML IJ SOLN: 4.0000 mg | Freq: Four times a day (QID) | INTRAMUSCULAR | Status: DC | PRN

## 2018-01-09 MED ORDER — HEPARIN (PORCINE) IN NACL 1000-0.9 UT/500ML-% IV SOLN
INTRAVENOUS | Status: DC | PRN
  Administered 2018-01-09 (×2): 500 mL

## 2018-01-09 MED ORDER — SODIUM CHLORIDE 0.9 % IV SOLN
INTRAVENOUS | Status: DC
  Administered 2018-01-09: 09:00:00 via INTRAVENOUS

## 2018-01-09 MED ORDER — LABETALOL HCL 5 MG/ML IV SOLN: 10.0000 mg | INTRAVENOUS | Status: DC | PRN

## 2018-01-09 MED ORDER — SODIUM CHLORIDE 0.9 % IV SOLN: 250.0000 mL | INTRAVENOUS | Status: DC | PRN

## 2018-01-09 MED ORDER — LIDOCAINE HCL (PF) 1 % IJ SOLN
INTRAMUSCULAR | Status: AC
  Filled 2018-01-09: qty 30

## 2018-01-09 MED ORDER — HYDRALAZINE HCL 20 MG/ML IJ SOLN: 5.0000 mg | INTRAMUSCULAR | Status: DC | PRN

## 2018-01-09 MED ORDER — SODIUM CHLORIDE 0.9% FLUSH: 3.0000 mL | Freq: Two times a day (BID) | INTRAVENOUS | Status: DC

## 2018-01-09 MED ORDER — IODIXANOL 320 MG/ML IV SOLN
INTRAVENOUS | Status: DC | PRN
  Administered 2018-01-09: 50 mL via INTRA_ARTERIAL

## 2018-01-09 MED ORDER — ASPIRIN EC 81 MG PO TBEC: 81.0000 mg | DELAYED_RELEASE_TABLET | Freq: Every day | ORAL | Status: DC

## 2018-01-09 MED ORDER — ACETAMINOPHEN 325 MG PO TABS: 650.0000 mg | ORAL_TABLET | ORAL | Status: DC | PRN

## 2018-01-09 MED ORDER — SODIUM CHLORIDE 0.9 % WEIGHT BASED INFUSION: 1.0000 mL/kg/h | INTRAVENOUS | Status: DC

## 2018-01-09 SURGICAL SUPPLY — 10 items
CATH OMNI FLUSH 5F 65CM (CATHETERS) ×2 IMPLANT
DEVICE CLOSURE MYNXGRIP 5F (Vascular Products) ×2 IMPLANT
KIT MICROPUNCTURE NIT STIFF (SHEATH) ×2 IMPLANT
KIT PV (KITS) ×2 IMPLANT
SHEATH PINNACLE 5F 10CM (SHEATH) ×2 IMPLANT
SHEATH PROBE COVER 6X72 (BAG) ×2 IMPLANT
SYR MEDRAD MARK V 150ML (SYRINGE) ×2 IMPLANT
TRANSDUCER W/STOPCOCK (MISCELLANEOUS) ×2 IMPLANT
TRAY PV CATH (CUSTOM PROCEDURE TRAY) ×2 IMPLANT
WIRE BENTSON .035X145CM (WIRE) ×2 IMPLANT

## 2018-01-09 NOTE — H&P (Signed)
History and Physical Interval Note:  01/09/2018 1:11 PM  Sparrow Smestad  has presented today for surgery, with the diagnosis of pad  The various methods of treatment have been discussed with the patient and family. After consideration of risks, benefits and other options for treatment, the patient has consented to  Procedure(s): ABDOMINAL AORTOGRAM W/LOWER EXTREMITY (N/A) as a surgical intervention .  The patient's history has been reviewed, patient examined, no change in status, stable for surgery.  I have reviewed the patient's chart and labs.  Questions were answered to the patient's satisfaction.   Aortogram, Left leg arteriogram.  Occluded fem bk pop prosthetic bypass with rest pain.  Cephus Shelling  [facility] [place]   CC: Follow up peripheral artery occlusive disease  History of Present Illness 1. Rian Busche is a 67 y.o. male who is s/p L CFA to BK pop BPG w/ Propaten, L PFA TE (12/14/16) by Dr. Imogene Burn for rest pain related to complications from Cardiology attempt to revascularize occluded L SFA stents.  Wife states right leg vascular procedure was done at [facility] about 2015 after he fractured his right femur.   Pt wife states pt left foot started feeling worse with walking in the last 2 months. He has worsening numbness in his left foot with walking, and walking further his left calf feels numb.  He has no rest pain, and no pain in his left leg unless he walks about 400 feet.   He denies chest pain or dyspnea, denies headache.   Pt denies any known history of stroke or TIA.  He denies any known cardiac problems.    Diabetic: No Tobacco use: smoker  (1 ppd x 49 yrs)  Pt meds include: Statin :Yes Betablocker: No ASA: Yes Other anticoagulants/antiplatelets: Plavix      Past Medical History:  Diagnosis Date  . Hypertension   . PVD (peripheral vascular disease) (HCC)     Social History Social History        Tobacco Use    . Smoking status: Current Every Day Smoker    Packs/day: 1.00    Years: 49.00    Pack years: 49.00    Types: Cigarettes    Last attempt to quit: 11/10/2016    Years since quitting: 1.1  . Smokeless tobacco: Never Used  Substance Use Topics  . Alcohol use: Yes    Alcohol/week: 11.0 standard drinks    Types: 11 Shots of liquor per week    Comment: 12/13/2016 "1 pint whiskey/wk"  . Drug use: No    Family History History reviewed. No pertinent family history.  Past Surgical History:  Procedure Laterality Date  . ABDOMINAL AORTAGRAM Left 12/13/2016   ABDOMINAL AORTOGRAM W/LOWER EXTREMITY/notes 12/13/2016  . ABDOMINAL AORTOGRAM W/LOWER EXTREMITY N/A 11/28/2016   Procedure: ABDOMINAL AORTOGRAM W/LOWER EXTREMITY;  Surgeon: Yates Decamp, MD;  Location: [facility] INVASIVE CV LAB;  Service: Cardiovascular;  Laterality: N/A;  bilateral  . ABDOMINAL AORTOGRAM W/LOWER EXTREMITY N/A 12/13/2016   Procedure: ABDOMINAL AORTOGRAM W/LOWER EXTREMITY;  Surgeon: Maeola Harman, MD;  Location: [facility] INVASIVE CV LAB;  Service: Cardiovascular;  Laterality: N/A;  unilater left leg  . FEMORAL-POPLITEAL BYPASS GRAFT Left 12/14/2016   Procedure: BYPASS GRAFT COMMON FEMORAL- BELOW KNEE POPLITEAL ARTERY with Bovine Patch to Below the knee poplitieal artery.;  Surgeon: Fransisco Hertz, MD;  Location: [facility] OR;  Service: Vascular;  Laterality: Left;  . LOWER EXTREMITY ANGIOGRAPHY Left 11/28/2016   Procedure: Lower Extremity Angiography;  Surgeon: Jacinto Halim,  Vonna KotykJay, MD;  Location: [facility] INVASIVE CV LAB;  Service: Cardiovascular;  Laterality: Left;  lysis followup lower leg  . PERIPHERAL VASCULAR BALLOON ANGIOPLASTY  11/28/2016   Procedure: PERIPHERAL VASCULAR BALLOON ANGIOPLASTY;  Surgeon: Yates DecampGanji, Jay, MD;  Location: [facility] INVASIVE CV LAB;  Service: Cardiovascular;;  SFA  . PERIPHERAL VASCULAR CATHETERIZATION N/A 08/11/2014   Procedure: Lower Extremity Angiography;  Surgeon: Yates DecampJay Ganji, MD;  [facility];  Service: Cardiovascular;  Laterality: N/A;  . PERIPHERAL VASCULAR THROMBECTOMY  11/28/2016   Procedure: PERIPHERAL VASCULAR THROMBECTOMY;  Surgeon: Yates DecampGanji, Jay, MD;  Location: [facility] INVASIVE CV LAB;  Service: Cardiovascular;;  Profunda  . PERIPHERAL VASCULAR THROMBECTOMY  11/28/2016   Procedure: PERIPHERAL VASCULAR THROMBECTOMY;  Surgeon: Yates DecampGanji, Jay, MD;  Location: [facility] INVASIVE CV LAB;  Service: Cardiovascular;;  . THROMBECTOMY FEMORAL ARTERY Left 12/14/2016   Procedure: THROMBECTOMY PROFUNDA FEMORAL ARTERY;  Surgeon: Fransisco Hertzhen, Brian L, MD;  [facility];  Service: Vascular;  Laterality: Left;    No Known Allergies        Current Outpatient Medications  Medication Sig Dispense Refill  . aspirin 81 MG tablet Take 81 mg by mouth daily.    . clopidogrel (PLAVIX) 75 MG tablet Take 1 tablet (75 mg total) by mouth daily. 30 tablet 6  . feeding supplement, ENSURE ENLIVE, (ENSURE ENLIVE) LIQD Take 237 mLs 2 (two) times daily between meals by mouth. 237 mL 12  . nitroGLYCERIN (NITRODUR - DOSED IN MG/24 HR) 0.2 mg/hr patch Place 1 patch (0.2 mg total) onto the skin daily. 30 patch 1  . oxyCODONE-acetaminophen (PERCOCET/ROXICET) 5-325 MG tablet Take 1 tablet by mouth every 8 (eight) hours as needed for moderate pain or severe pain. 30 tablet 0  . rosuvastatin (CRESTOR) 20 MG tablet Take 20 mg by mouth daily.  1   No current facility-administered medications for this visit.     ROS: See HPI for pertinent positives and negatives.   Physical Examination     Vitals:   01/03/18 0941  BP: (!) 199/86  Pulse: (!) 54  Resp: 12  Temp: (!) 97.2 F (36.2 C)  SpO2: 97%  Weight: 155 lb (70.3 kg)  Height: 5\' 6"  (1.676 m)   Body mass index is 25.02 kg/m.  General: A&O x 3, WDWN, male. Gait: normal HENT: No gross abnormalities.Edentulous.  Eyes: PERRLA. Pulmonary: Respirations are non labored, CTAB, fair air movement in all fields Cardiac: regular rhythm, no  detected murmur.         Carotid Bruits Right Left   Negative Negative   Radial pulses are 2+ palpable bilaterally   Adominal aortic pulse is not palpable                         VASCULAR EXAM: Extremities with ischemic changes, without Gangrene; without open wounds. Left forefoot and toes are more dusky than right.  LE Pulses Right Left       FEMORAL  2+ palpable  2+ palpable        POPLITEAL  not palpable   not palpable       POSTERIOR TIBIAL  not palpable   not palpable        DORSALIS PEDIS      ANTERIOR TIBIAL not palpable  not palpable    Abdomen: soft, NT, no palpable masses. Skin: no rashes, no cellulitis, no ulcers noted. Musculoskeletal: no muscle wasting or atrophy.      Neurologic: A&O X 3; appropriate affect, Sensation is normal; MOTOR FUNCTION:  moving all extremities equally, motor strength 5/5 throughout. Speech is fluent/normal. CN 2-12 intact. Psychiatric: Thought content is normal, mood appropriate for clinical situation.     ASSESSMENT: Juwon Scripter is a 67 y.o. male who is s/p L CFA to BK pop BPG w/ Propaten for LLE rest pain due to embolization during cardiology attempt to revascularize occluded L SFA stents.  In the last 2 months his left foot has felt numb, this is not worsening. He does not seem to have rest pain. When he walks the numbness extends up to his left calf, and left calf will hurt after walking about 400 feet. There are no ulcers in his lower extremities. Occluded left femoral to below the knee bypass graft.   Unfortunately he continues to smoke 1 ppd, fortunately he does not have DM. He takes a daily statin, 81 mg ASA, and Plavix.   Asymptomatic elevated blood pressure now: Pt has not yet taken his morning medications. I advised him to take his morning  medications ASAP, wait and hour, check his blood pressure (states he has a blood pressure monitor at home), and to notify his PCP if his blood pressure remains greater than 160/90.    DATA  Left LE Arterial Duplex (01-03-18): Occluded left femoral to below the knee bypass graft.  ABI (Date: 01/03/2018):  R:  ? ABI: 0.63 (was 0.50 on 07-18-17),  ? PT: mono ? DP: mono ? TBI:  0.44, toe pressure 88, (was 0.39)  L:  ? ABI: 0.41 (was 1.11),  ? PT: mono ? DP: mono ? TBI: unable to obtain, (was 0.86) Slightly improved right ABI with moderate disease and monophasic waveforms. Significant decline in left ABI from 100% to 41%, monophasic waveforms.     PLAN:  Based on the patient's vascular studies and examination, and after discussing with Dr. Darrick Penna, pt will be scheduled for an aortogram with run off, possible intervention, for left foot worsening numbness in the last 2 months, dusky appearance of left forefoot. Will schedule for 01-09-18, Dr. Chestine Spore.  Over 3 minutes was spent counseling patient re smoking cessation, and patient was given several free resources re smoking cessation.   I discussed in depth with the patient the nature of atherosclerosis, and emphasized the importance of maximal medical management including strict control of blood pressure, blood glucose, and lipid levels, obtaining regular exercise, and cessation of smoking.  The patient is aware that without maximal medical management the underlying atherosclerotic disease process will progress, limiting the benefit of any interventions.  The patient was given information about PAD including signs, symptoms, treatment, what symptoms should prompt the patient to seek immediate medical care, and risk reduction measures to take.  Charisse March, RN, MSN, FNP-C [facility] [place] Phone: [phone]  Clinic MD: Darrick Penna

## 2018-01-09 NOTE — Op Note (Signed)
Patient name: Ryan [name] MRN: [id number] DOB: Apr 20, 1950 Sex: male  01/09/2018 Pre-operative Diagnosis: Occluded left femoral to below-knee popliteal prosthetic bypass with left leg claudication and early rest pain Post-operative diagnosis:  Same Surgeon:  Cephus Shelling, MD Procedure Performed: 1.  Ultrasound-guided access of the right common femoral artery 2.  Aortogram 3.  Left lower extremity arteriogram with selection of second order branches 4.  Mynx closure of the right common femoral artery  Indications: Patient is a 67 year old male who has a complex history of left lower extremity revascularization including remote history of left SFA stenting by cardiology that subsequently thrombosed and then he required a left common femoral to below-knee popliteal bypass with prosthetic graft in 11/2016.  He was seen in clinic several weeks ago for follow-up and had been having increasing claudication and early rest pain symptoms since the summer and was noted to have an occluded bypass graft.  He presents today for left lower extremity arteriogram after risks and benefits are discussed.  Findings: Aortogram revealed a widely patent aortoiliac segment.  He does have an ectatic left common iliac artery but there is no evidence of overt inflow disease.  His left common femoral as well as left profunda are patent.  His left common femoral to below-knee popliteal bypass is occluded.  His left SFA has a flush occlusion and all of his previous SFA stents are occluded.  He does reconstitute a very diseased above-knee popliteal artery and has a patent below-knee popliteal artery that is small but patent with two-vessel runoff via the posterior tibial and peroneal artery.   Procedure:  The patient was identified in the holding area and taken to room 8.  The patient was then placed supine on the table and prepped and draped in the usual sterile fashion.  A time out was called.  Ultrasound was used to  evaluate the right common femoral artery.  It was patent .  A digital ultrasound image was acquired.  A micropuncture needle was used to access the right common femoral artery under ultrasound guidance.  An 018 wire was advanced without resistance and a micropuncture sheath was placed.  The 018 wire was removed and a benson wire was placed.  The micropuncture sheath was exchanged for a 5 french sheath.  An omniflush catheter was advanced over the wire to the level of L-1.  An abdominal angiogram was obtained.  Next, using the omniflush catheter and a benson wire, the aortic bifurcation was crossed and the catheter was placed into theleft external iliac artery and left runoff was obtained.  Ultimately given a flush occlusion of his SFA with all of his SFA stents occluded I do not think he has an endovascular option at this time.  In addition his left common femoral to below-knee popliteal bypass is occluded and given he has been symptomatic for the last 5 to 6 months I do not think thrombolysis is an option.  The only option that I see is a redo left femoral to distal bypass.  At that point in time the catheters and wires were removed.  A Mynx closure device was deployed in the right common femoral artery and additional pressure was held.  He was taken the PACU in stable condition.  Plan: Discussed with the patient that his options are somewhat limited at this time given long segment flush occlusion of his left SFA and all of his previous SFA stents are occluded.  Now his common femoral to below-knee popliteal  prosthetic bypass is also occluded presumably since this summer given his symptom onset.  I have offered him the option of a redo left common femoral to below-knee popliteal bypass with prosthetic.  No usable vein on past vein mapping.  I offered him several dates for surgery today and he wants to contact our office once he talks to his family.     Cephus Shellinghristopher J. Izaiyah Kleinman, MD [facility]  [place] Office: [phone] Pager: [phone]  Cephus Shellinghristopher J Kesia Dalto

## 2018-01-09 NOTE — Progress Notes (Signed)
No evidence of bleeding or hematoma post ambulation

## 2018-01-09 NOTE — Discharge Instructions (Signed)

## 2018-01-10 ENCOUNTER — Encounter (HOSPITAL_COMMUNITY): Payer: Self-pay | Admitting: Vascular Surgery

## 2018-01-14 ENCOUNTER — Telehealth: Payer: Self-pay

## 2018-01-14 ENCOUNTER — Other Ambulatory Visit: Payer: Self-pay

## 2018-01-14 NOTE — Telephone Encounter (Signed)
Spoke to patient to inform him of his scheduled surgery date and arrival time. Informed pt. Of where to report the morning of surgery, NPO after midnight, and to hold Plavix 5 days prior to surgery. Pt. verablized understanding. No further questions at this time.

## 2018-01-15 NOTE — Pre-Procedure Instructions (Signed)
Sharyon MedicusLevon Leib  01/15/2018      [id number] - [place], [place] - [address] [facility] [address] [place] [id number] Phone: [phone] Fax: [phone]    Your procedure is scheduled on January 28, 2018.  [facility] Admitting at 530 AM.  Call this number if you have problems the morning of surgery:  [phone]   Remember:  Do not eat or drink after midnight.    Take these medicines the morning of surgery with A SIP OF WATER  Atenolol (tenormin) Aspirin  7 days prior to surgery STOP taking any Aleve, Naproxen, Ibuprofen, Motrin, Advil, Goody's, BC's, all herbal medications, fish oil, and all vitamins  Hold plavix 5 days prior to surgery-last dose 01/23/18 and continue aspirin as instructed by your surgeon    Do not wear jewelry  Do not wear lotions, powders, or colognes, or deodorant.  Men may shave face and neck.  Do not bring valuables to the hospital.  [facility] is not responsible for any belongings or valuables.  Contacts, dentures or bridgework may not be worn into surgery.  Leave your suitcase in the car.  After surgery it may be brought to your room.  For patients admitted to the hospital, discharge time will be determined by your treatment team.  Patients discharged the day of surgery will not be allowed to drive home.   [facility]- Preparing For Surgery  Before surgery, you can play an important role. Because skin is not sterile, your skin needs to be as free of germs as possible. You can reduce the number of germs on your skin by washing with CHG (chlorahexidine gluconate) Soap before surgery.  CHG is an antiseptic cleaner which kills germs and bonds with the skin to continue killing germs even after washing.    Oral Hygiene is also important to reduce your risk of infection.  Remember - BRUSH YOUR TEETH THE MORNING OF SURGERY WITH YOUR REGULAR TOOTHPASTE  Please do not use if you have an allergy to CHG or  antibacterial soaps. If your skin becomes reddened/irritated stop using the CHG.  Do not shave (including legs and underarms) for at least 48 hours prior to first CHG shower. It is OK to shave your face.  Please follow these instructions carefully.   1. Shower the NIGHT BEFORE SURGERY and the MORNING OF SURGERY with CHG.   2. If you chose to wash your hair, wash your hair first as usual with your normal shampoo.  3. After you shampoo, rinse your hair and body thoroughly to remove the shampoo.  4. Use CHG as you would any other liquid soap. You can apply CHG directly to the skin and wash gently with a scrungie or a clean washcloth.   5. Apply the CHG Soap to your body ONLY FROM THE NECK DOWN.  Do not use on open wounds or open sores. Avoid contact with your eyes, ears, mouth and genitals (private parts). Wash Face and genitals (private parts)  with your normal soap.  6. Wash thoroughly, paying special attention to the area where your surgery will be performed.  7. Thoroughly rinse your body with warm water from the neck down.  8. DO NOT shower/wash with your normal soap after using and rinsing off the CHG Soap.  9. Pat yourself dry with a CLEAN TOWEL.  10. Wear CLEAN PAJAMAS to bed the night before surgery, wear comfortable clothes the morning of surgery  11.  Place CLEAN SHEETS on your bed the night of your first shower and DO NOT SLEEP WITH PETS.  Day of Surgery:  Do not apply any deodorants/lotions.  Please wear clean clothes to the hospital/surgery center.   Remember to brush your teeth WITH YOUR REGULAR TOOTHPASTE.   Please read over the following fact sheets that you were given.

## 2018-01-17 ENCOUNTER — Encounter (HOSPITAL_COMMUNITY)
Admission: RE | Admit: 2018-01-17 | Discharge: 2018-01-17 | Disposition: A | Discharge status: Home / Self Care | Payer: Medicare Other | Source: Ambulatory Visit | Attending: Vascular Surgery | Admitting: Vascular Surgery

## 2018-01-17 ENCOUNTER — Encounter (HOSPITAL_COMMUNITY): Payer: Self-pay

## 2018-01-17 DIAGNOSIS — Z01812 Encounter for preprocedural laboratory examination: Secondary | ICD-10-CM | POA: Diagnosis not present

## 2018-01-17 LAB — URINALYSIS, ROUTINE W REFLEX MICROSCOPIC
Bilirubin Urine: NEGATIVE
GLUCOSE, UA: NEGATIVE mg/dL
Hgb urine dipstick: NEGATIVE
Ketones, ur: NEGATIVE mg/dL
LEUKOCYTES UA: NEGATIVE
Nitrite: NEGATIVE
Protein, ur: NEGATIVE mg/dL
Specific Gravity, Urine: 1.017 (ref 1.005–1.030)
pH: 6 (ref 5.0–8.0)

## 2018-01-17 LAB — CBC
HEMATOCRIT: 50.9 % (ref 39.0–52.0)
Hemoglobin: 15.5 g/dL (ref 13.0–17.0)
MCH: 27 pg (ref 26.0–34.0)
MCHC: 30.5 g/dL (ref 30.0–36.0)
MCV: 88.7 fL (ref 80.0–100.0)
Platelets: 284 10*3/uL (ref 150–400)
RBC: 5.74 MIL/uL (ref 4.22–5.81)
RDW: 14.4 % (ref 11.5–15.5)
WBC: 5.7 10*3/uL (ref 4.0–10.5)
nRBC: 0 % (ref 0.0–0.2)

## 2018-01-17 LAB — COMPREHENSIVE METABOLIC PANEL
ALT: 22 U/L (ref 0–44)
AST: 26 U/L (ref 15–41)
Albumin: 4.2 g/dL (ref 3.5–5.0)
Alkaline Phosphatase: 76 U/L (ref 38–126)
Anion gap: 9 (ref 5–15)
BUN: 17 mg/dL (ref 8–23)
CO2: 27 mmol/L (ref 22–32)
CREATININE: 1.05 mg/dL (ref 0.61–1.24)
Calcium: 9.6 mg/dL (ref 8.9–10.3)
Chloride: 103 mmol/L (ref 98–111)
GFR calc Af Amer: >60 mL/min (ref >60)
GFR calc non Af Amer: >60 mL/min (ref >60)
Glucose, Bld: 135 mg/dL — ABNORMAL HIGH (ref 70–99)
Potassium: 4 mmol/L (ref 3.5–5.1)
Sodium: 139 mmol/L (ref 135–145)
Total Bilirubin: 0.5 mg/dL (ref 0.3–1.2)
Total Protein: 8 g/dL (ref 6.5–8.1)

## 2018-01-17 LAB — PROTIME-INR
INR: 0.97
Prothrombin Time: 12.8 seconds (ref 11.4–15.2)

## 2018-01-17 LAB — APTT: aPTT: 29 seconds (ref 24–36)

## 2018-01-17 LAB — SURGICAL PCR SCREEN
MRSA, PCR: NEGATIVE
Staphylococcus aureus: NEGATIVE

## 2018-01-17 LAB — NO BLOOD PRODUCTS

## 2018-01-17 NOTE — Progress Notes (Signed)
PCP: Mirna [facility] [place] Cardiologist:none  Pt. Reported he will stop plavix on 01-23-18 per Dr. Chestine Sporelark  Pt. Reported he refuses blood products; he is in the process of becoming  A [employer] Witness.  Refusal blood sheet faxed to blood bank and also notified Dr. Ophelia Charterlark's office--left message with Kriste BasqueBecky.

## 2018-01-18 NOTE — Progress Notes (Addendum)
Anesthesia Chart Review:  Case:  [id number] Date/Time:  01/28/18 0715   Procedure:  BYPASS GRAFT FEMORAL-BELOW KNEE POPLITEAL ARTERY REDO (Left )   Anesthesia type:  General   Pre-op diagnosis:  occluded femoral bypass   [facility]  [facility] / [facility] OR   Surgeon:  Cephus Shellinglark, Christopher J, MD      DISCUSSION: Patient is a 67 year old male scheduled for the above procedure.  History includes smoking, HTN, PVD (bilateral SFA stents '16; left CFA-below knee popliteal artery bypass with Propaten, embolectomy of left profunda femoris artery 12/14/16; 01/09/18 arteriogram: occluded left FPBG),  - Patient is a Jehov[employer]'s Witness. He requests that only albumin or albumin-containing products be administered during hospitalization if deemed necessary to preserve his life or promote recovery.  Reports that last Plavix dose 01/23/18.  Most recent cardiology records from [facility] requested, but he was primarily seen there for PAD and is now followed by [facility] since requiring FPBG. Will plan to review records once received.  Addendum 01/25/18: No additional pertinent records received from [facility]. Anticipate pt can proceed as planned.  VS: BP (!) 164/78   Pulse (!) 58   Temp 36.6 C   Resp 18   Ht 5\' 6"  (1.676 m)   Wt 66 kg   SpO2 100%   BMI 23.47 kg/m    PROVIDERS: Mirna MiresHill, Gerald, MD is PCP - Prior to undergoing FPBG, he had been followed for PAD by cardiologist Yates DecampGanji, Jay, MD [facility]). Most recent records requested.   LABS: Labs reviewed: Acceptable for surgery. (all labs ordered are listed, but only abnormal results are displayed)  Labs Reviewed  COMPREHENSIVE METABOLIC PANEL - Abnormal; Notable for the following components:      Result Value   Glucose, Bld 135 (*)    All other components within normal limits  SURGICAL PCR SCREEN  APTT  CBC  PROTIME-INR  URINALYSIS, ROUTINE W REFLEX MICROSCOPIC  NO BLOOD PRODUCTS    EKG: 01/09/18: SB at 53  bpm. Right BBB. LAFB. Bifascicular block. Moderate voltage criteria for LVH, may be normal variant. Possible lateral infarct (age undetermined). Non-specific T wave abnormality. He has intermittently had negative lateral T waves (not seen as much in 2018 tracings, but noted in V5-6, I, aVL on [facility]/14 [facility].    CV: Nuclear stress test 07/26/14 [facility]): Impression: 1. Resting EKG demonstrates NSR, LAD, LAFB, RBBB. Stress EKG was non-diagnostic for ischemia as it is a pharmacologic stress test. There is no significant ST-T wave changes. Stress symptoms included dyspnea and dizziness. 2. Perfusion imaging study is normal. There is no evidence of ischemia or scar. LVEF 57% with no regional wall motion abnormality.  Carotid U/S 07/08/14 [facility]): Conclusions: No hemodynamically significant arterial disease in the internal carotid artery bilaterally.   Past Medical History:  Diagnosis Date  . Hypertension   . PVD (peripheral vascular disease) (HCC)     Past Surgical History:  Procedure Laterality Date  . ABDOMINAL AORTAGRAM Left 12/13/2016   ABDOMINAL AORTOGRAM W/LOWER EXTREMITY/notes 12/13/2016  . ABDOMINAL AORTOGRAM W/LOWER EXTREMITY N/A 11/28/2016   Procedure: ABDOMINAL AORTOGRAM W/LOWER EXTREMITY;  Surgeon: Yates DecampGanji, Jay, MD;  [facility];  Service: Cardiovascular;  Laterality: N/A;  bilateral  . ABDOMINAL AORTOGRAM W/LOWER EXTREMITY N/A 12/13/2016   Procedure: ABDOMINAL AORTOGRAM W/LOWER EXTREMITY;  Surgeon: Maeola Harmanain, Brandon Christopher, MD;  [facility];  Service: Cardiovascular;  Laterality: N/A;  unilater left leg  . ABDOMINAL AORTOGRAM  W/LOWER EXTREMITY N/A 01/09/2018   Procedure: ABDOMINAL AORTOGRAM W/LOWER EXTREMITY;  Surgeon: Cephus Shellinglark, Christopher J, MD;  [facility];  Service: Cardiovascular;  Laterality: N/A;  . FEMORAL-POPLITEAL BYPASS GRAFT Left 12/14/2016   Procedure: BYPASS GRAFT COMMON FEMORAL-  BELOW KNEE POPLITEAL ARTERY with Bovine Patch to Below the knee poplitieal artery.;  Surgeon: Fransisco Hertzhen, Brian L, MD;  [facility];  Service: Vascular;  Laterality: Left;  . FEMUR SURGERY Right 2009   pt. has a rod in that leg  . LOWER EXTREMITY ANGIOGRAPHY Left 11/28/2016   Procedure: Lower Extremity Angiography;  Surgeon: Yates DecampGanji, Jay, MD;  [facility];  Service: Cardiovascular;  Laterality: Left;  lysis followup lower leg  . PERIPHERAL VASCULAR BALLOON ANGIOPLASTY  11/28/2016   Procedure: PERIPHERAL VASCULAR BALLOON ANGIOPLASTY;  Surgeon: Yates DecampGanji, Jay, MD;  [facility];  Service: Cardiovascular;;  SFA  . PERIPHERAL VASCULAR CATHETERIZATION N/A 08/11/2014   Procedure: Lower Extremity Angiography;  Surgeon: Yates DecampJay Ganji, MD;  [facility];  Service: Cardiovascular;  Laterality: N/A;  . PERIPHERAL VASCULAR THROMBECTOMY  11/28/2016   Procedure: PERIPHERAL VASCULAR THROMBECTOMY;  Surgeon: Yates DecampGanji, Jay, MD;  [facility];  Service: Cardiovascular;;  Profunda  . PERIPHERAL VASCULAR THROMBECTOMY  11/28/2016   Procedure: PERIPHERAL VASCULAR THROMBECTOMY;  Surgeon: Yates DecampGanji, Jay, MD;  [facility];  Service: Cardiovascular;;  . THROMBECTOMY FEMORAL ARTERY Left 12/14/2016   Procedure: THROMBECTOMY PROFUNDA FEMORAL ARTERY;  Surgeon: Fransisco Hertzhen, Brian L, MD;  [facility];  Service: Vascular;  Laterality: Left;    MEDICATIONS: . aspirin 81 MG tablet  . atenolol (TENORMIN) 50 MG tablet  . clopidogrel (PLAVIX) 75 MG tablet  . feeding supplement, ENSURE ENLIVE, (ENSURE ENLIVE) LIQD  . hydrochlorothiazide (HYDRODIURIL) 25 MG tablet  . losartan (COZAAR) 100 MG tablet  . nitroGLYCERIN (NITRODUR - DOSED IN MG/24 HR) 0.2 mg/hr patch  . rosuvastatin (CRESTOR) 20 MG tablet   No current facility-administered medications for this encounter.     Velna Ochsllison Zelenak, PA-C [facility]iology Phone [phone] 01/18/2018 5:00  PM

## 2018-01-28 ENCOUNTER — Inpatient Hospital Stay (HOSPITAL_COMMUNITY): Payer: Medicare Other | Admitting: Vascular Surgery

## 2018-01-28 ENCOUNTER — Inpatient Hospital Stay (HOSPITAL_COMMUNITY)
Admission: RE | Admit: 2018-01-28 | Discharge: 2018-01-31 | DRG: 254 | Disposition: A | Discharge status: Home Health | Payer: Medicare Other | Attending: Vascular Surgery | Admitting: Vascular Surgery

## 2018-01-28 ENCOUNTER — Encounter (HOSPITAL_COMMUNITY): Payer: Self-pay

## 2018-01-28 ENCOUNTER — Inpatient Hospital Stay (HOSPITAL_COMMUNITY): Payer: Medicare Other

## 2018-01-28 ENCOUNTER — Inpatient Hospital Stay (HOSPITAL_COMMUNITY)
Admission: RE | Disposition: A | Discharge status: Home Health | Payer: Self-pay | Source: Home / Self Care | Attending: Vascular Surgery

## 2018-01-28 DIAGNOSIS — I998 Other disorder of circulatory system: Secondary | ICD-10-CM | POA: Diagnosis present

## 2018-01-28 DIAGNOSIS — F17210 Nicotine dependence, cigarettes, uncomplicated: Secondary | ICD-10-CM | POA: Diagnosis present

## 2018-01-28 DIAGNOSIS — T82898A Other specified complication of vascular prosthetic devices, implants and grafts, initial encounter: Secondary | ICD-10-CM | POA: Diagnosis present

## 2018-01-28 DIAGNOSIS — Z79899 Other long term (current) drug therapy: Secondary | ICD-10-CM | POA: Diagnosis not present

## 2018-01-28 DIAGNOSIS — Y831 Surgical operation with implant of artificial internal device as the cause of abnormal reaction of the patient, or of later complication, without mention of misadventure at the time of the procedure: Secondary | ICD-10-CM | POA: Diagnosis present

## 2018-01-28 DIAGNOSIS — I743 Embolism and thrombosis of arteries of the lower extremities: Secondary | ICD-10-CM | POA: Diagnosis not present

## 2018-01-28 DIAGNOSIS — I739 Peripheral vascular disease, unspecified: Secondary | ICD-10-CM | POA: Diagnosis present

## 2018-01-28 DIAGNOSIS — I10 Essential (primary) hypertension: Secondary | ICD-10-CM | POA: Diagnosis present

## 2018-01-28 DIAGNOSIS — Z7902 Long term (current) use of antithrombotics/antiplatelets: Secondary | ICD-10-CM | POA: Diagnosis not present

## 2018-01-28 DIAGNOSIS — Z7982 Long term (current) use of aspirin: Secondary | ICD-10-CM | POA: Diagnosis not present

## 2018-01-28 HISTORY — PX: PATCH ANGIOPLASTY: SHX6230

## 2018-01-28 HISTORY — PX: FEMORAL-POPLITEAL BYPASS GRAFT: SHX937

## 2018-01-28 LAB — CBC
HCT: 38.1 % — ABNORMAL LOW (ref 39.0–52.0)
Hemoglobin: 11.9 g/dL — ABNORMAL LOW (ref 13.0–17.0)
MCH: 27.6 pg (ref 26.0–34.0)
MCHC: 31.2 g/dL (ref 30.0–36.0)
MCV: 88.4 fL (ref 80.0–100.0)
Platelets: 232 10*3/uL (ref 150–400)
RBC: 4.31 MIL/uL (ref 4.22–5.81)
RDW: 14.5 % (ref 11.5–15.5)
WBC: 12.3 10*3/uL — ABNORMAL HIGH (ref 4.0–10.5)
nRBC: 0 % (ref 0.0–0.2)

## 2018-01-28 LAB — CREATININE, SERUM
Creatinine, Ser: 1.01 mg/dL (ref 0.61–1.24)
GFR calc Af Amer: >60 mL/min (ref >60)
GFR calc non Af Amer: >60 mL/min (ref >60)

## 2018-01-28 LAB — POCT ACTIVATED CLOTTING TIME
Activated Clotting Time: 268 seconds
Activated Clotting Time: 318 seconds

## 2018-01-28 SURGERY — BYPASS GRAFT FEMORAL-POPLITEAL ARTERY
Anesthesia: General | Site: Leg Lower | Laterality: Left

## 2018-01-28 MED ORDER — ACETAMINOPHEN 325 MG PO TABS
325.0000 mg | ORAL_TABLET | ORAL | Status: DC | PRN
  Administered 2018-01-28: 650 mg via ORAL
  Filled 2018-01-28: qty 2

## 2018-01-28 MED ORDER — LOSARTAN POTASSIUM 50 MG PO TABS
100.0000 mg | ORAL_TABLET | Freq: Every day | ORAL | Status: DC
  Administered 2018-01-28 – 2018-01-30 (×3): 100 mg via ORAL
  Filled 2018-01-28 (×4): qty 2

## 2018-01-28 MED ORDER — PHENYLEPHRINE HCL 10 MG/ML IJ SOLN
INTRAMUSCULAR | Status: DC | PRN
  Administered 2018-01-28 (×3): 80 ug via INTRAVENOUS
  Administered 2018-01-28: 120 ug via INTRAVENOUS

## 2018-01-28 MED ORDER — LACTATED RINGERS IV SOLN
INTRAVENOUS | Status: DC | PRN
  Administered 2018-01-28: 07:00:00 via INTRAVENOUS

## 2018-01-28 MED ORDER — MAGNESIUM SULFATE 2 GM/50ML IV SOLN: 2.0000 g | Freq: Every day | INTRAVENOUS | Status: DC | PRN

## 2018-01-28 MED ORDER — HEPARIN SODIUM (PORCINE) 1000 UNIT/ML IJ SOLN
INTRAMUSCULAR | Status: DC | PRN
  Administered 2018-01-28: 7000 [IU] via INTRAVENOUS

## 2018-01-28 MED ORDER — METOPROLOL TARTRATE 5 MG/5ML IV SOLN: 2.0000 mg | INTRAVENOUS | Status: DC | PRN

## 2018-01-28 MED ORDER — PROPOFOL 10 MG/ML IV BOLUS
INTRAVENOUS | Status: AC
  Filled 2018-01-28: qty 20

## 2018-01-28 MED ORDER — ALBUMIN HUMAN 5 % IV SOLN
INTRAVENOUS | Status: DC | PRN
  Administered 2018-01-28: 11:00:00 via INTRAVENOUS

## 2018-01-28 MED ORDER — ROCURONIUM BROMIDE 50 MG/5ML IV SOSY
PREFILLED_SYRINGE | INTRAVENOUS | Status: AC
  Filled 2018-01-28: qty 5

## 2018-01-28 MED ORDER — SODIUM CHLORIDE 0.9 % IV SOLN
INTRAVENOUS | Status: DC | PRN
  Administered 2018-01-28: 50 ug/min via INTRAVENOUS
  Administered 2018-01-28: 11:00:00 via INTRAVENOUS

## 2018-01-28 MED ORDER — OXYCODONE HCL 5 MG PO TABS: 5.0000 mg | ORAL_TABLET | Freq: Once | ORAL | Status: DC | PRN

## 2018-01-28 MED ORDER — POTASSIUM CHLORIDE CRYS ER 20 MEQ PO TBCR: 20.0000 meq | EXTENDED_RELEASE_TABLET | Freq: Every day | ORAL | Status: DC | PRN

## 2018-01-28 MED ORDER — HEPARIN SODIUM (PORCINE) 5000 UNIT/ML IJ SOLN
5000.0000 [IU] | Freq: Three times a day (TID) | INTRAMUSCULAR | Status: DC
  Administered 2018-01-29 – 2018-01-31 (×7): 5000 [IU] via SUBCUTANEOUS
  Filled 2018-01-28 (×7): qty 1

## 2018-01-28 MED ORDER — CEFAZOLIN SODIUM-DEXTROSE 2-4 GM/100ML-% IV SOLN
2.0000 g | INTRAVENOUS | Status: DC
  Filled 2018-01-28: qty 100

## 2018-01-28 MED ORDER — DEXAMETHASONE SODIUM PHOSPHATE 10 MG/ML IJ SOLN
INTRAMUSCULAR | Status: DC | PRN
  Administered 2018-01-28: 8 mg via INTRAVENOUS

## 2018-01-28 MED ORDER — ALUM & MAG HYDROXIDE-SIMETH 200-200-20 MG/5ML PO SUSP
15.0000 mL | ORAL | Status: DC | PRN
  Administered 2018-01-28: 30 mL via ORAL
  Filled 2018-01-28: qty 30

## 2018-01-28 MED ORDER — LIDOCAINE 2% (20 MG/ML) 5 ML SYRINGE
INTRAMUSCULAR | Status: AC
  Filled 2018-01-28: qty 5

## 2018-01-28 MED ORDER — LIDOCAINE HCL (CARDIAC) PF 100 MG/5ML IV SOSY
PREFILLED_SYRINGE | INTRAVENOUS | Status: DC | PRN
  Administered 2018-01-28: 60 mg via INTRATRACHEAL

## 2018-01-28 MED ORDER — CHLORHEXIDINE GLUCONATE 4 % EX LIQD: 60.0000 mL | Freq: Once | CUTANEOUS | Status: DC

## 2018-01-28 MED ORDER — DOCUSATE SODIUM 100 MG PO CAPS
100.0000 mg | ORAL_CAPSULE | Freq: Every day | ORAL | Status: DC
  Administered 2018-01-29 – 2018-01-31 (×3): 100 mg via ORAL
  Filled 2018-01-28 (×3): qty 1

## 2018-01-28 MED ORDER — ATENOLOL 50 MG PO TABS
50.0000 mg | ORAL_TABLET | Freq: Every day | ORAL | Status: DC
  Administered 2018-01-29 – 2018-01-30 (×2): 50 mg via ORAL
  Filled 2018-01-28: qty 2
  Filled 2018-01-28: qty 1
  Filled 2018-01-28 (×2): qty 2
  Filled 2018-01-28: qty 1
  Filled 2018-01-28: qty 2
  Filled 2018-01-28: qty 1

## 2018-01-28 MED ORDER — GUAIFENESIN-DM 100-10 MG/5ML PO SYRP: 15.0000 mL | ORAL_SOLUTION | ORAL | Status: DC | PRN

## 2018-01-28 MED ORDER — PROTAMINE SULFATE 10 MG/ML IV SOLN
INTRAVENOUS | Status: DC | PRN
  Administered 2018-01-28: 40 mg via INTRAVENOUS

## 2018-01-28 MED ORDER — ACETAMINOPHEN 325 MG RE SUPP: 325.0000 mg | RECTAL | Status: DC | PRN

## 2018-01-28 MED ORDER — SODIUM CHLORIDE 0.9 % IV SOLN
INTRAVENOUS | Status: DC | PRN
  Administered 2018-01-28: 500 mL

## 2018-01-28 MED ORDER — LABETALOL HCL 5 MG/ML IV SOLN: 10.0000 mg | INTRAVENOUS | Status: DC | PRN

## 2018-01-28 MED ORDER — OXYCODONE HCL 5 MG/5ML PO SOLN: 5.0000 mg | Freq: Once | ORAL | Status: DC | PRN

## 2018-01-28 MED ORDER — SODIUM CHLORIDE 0.9 % IV SOLN
INTRAVENOUS | Status: AC
  Filled 2018-01-28: qty 1.2

## 2018-01-28 MED ORDER — FENTANYL CITRATE (PF) 250 MCG/5ML IJ SOLN
INTRAMUSCULAR | Status: AC
  Filled 2018-01-28: qty 5

## 2018-01-28 MED ORDER — ONDANSETRON HCL 4 MG/2ML IJ SOLN: 4.0000 mg | Freq: Four times a day (QID) | INTRAMUSCULAR | Status: DC | PRN

## 2018-01-28 MED ORDER — 0.9 % SODIUM CHLORIDE (POUR BTL) OPTIME
TOPICAL | Status: DC | PRN
  Administered 2018-01-28: 1000 mL

## 2018-01-28 MED ORDER — MORPHINE SULFATE (PF) 2 MG/ML IV SOLN
2.0000 mg | INTRAVENOUS | Status: DC | PRN
  Administered 2018-01-28 – 2018-01-29 (×3): 2 mg via INTRAVENOUS
  Filled 2018-01-28 (×3): qty 1

## 2018-01-28 MED ORDER — ONDANSETRON HCL 4 MG/2ML IJ SOLN
INTRAMUSCULAR | Status: AC
  Filled 2018-01-28: qty 2

## 2018-01-28 MED ORDER — FENTANYL CITRATE (PF) 250 MCG/5ML IJ SOLN
INTRAMUSCULAR | Status: DC | PRN
  Administered 2018-01-28 (×2): 25 ug via INTRAVENOUS
  Administered 2018-01-28: 100 ug via INTRAVENOUS
  Administered 2018-01-28: 25 ug via INTRAVENOUS
  Administered 2018-01-28: 50 ug via INTRAVENOUS
  Administered 2018-01-28: 25 ug via INTRAVENOUS

## 2018-01-28 MED ORDER — ONDANSETRON HCL 4 MG/2ML IJ SOLN
INTRAMUSCULAR | Status: DC | PRN
  Administered 2018-01-28: 4 mg via INTRAVENOUS

## 2018-01-28 MED ORDER — PROMETHAZINE HCL 25 MG/ML IJ SOLN: 6.2500 mg | INTRAMUSCULAR | Status: DC | PRN

## 2018-01-28 MED ORDER — SODIUM CHLORIDE 0.9 % IV SOLN
INTRAVENOUS | Status: DC
  Administered 2018-01-28 (×2): via INTRAVENOUS

## 2018-01-28 MED ORDER — THROMBIN 20000 UNITS EX SOLR
OROMUCOSAL | Status: DC | PRN
  Administered 2018-01-28: 20 mL via TOPICAL

## 2018-01-28 MED ORDER — DEXAMETHASONE SODIUM PHOSPHATE 10 MG/ML IJ SOLN
INTRAMUSCULAR | Status: AC
  Filled 2018-01-28: qty 1

## 2018-01-28 MED ORDER — HEPARIN SODIUM (PORCINE) 1000 UNIT/ML IJ SOLN
INTRAMUSCULAR | Status: AC
  Filled 2018-01-28: qty 1

## 2018-01-28 MED ORDER — ROSUVASTATIN CALCIUM 20 MG PO TABS
20.0000 mg | ORAL_TABLET | Freq: Every day | ORAL | Status: DC
  Administered 2018-01-28 – 2018-01-30 (×3): 20 mg via ORAL
  Filled 2018-01-28: qty 1

## 2018-01-28 MED ORDER — CEFAZOLIN SODIUM-DEXTROSE 2-4 GM/100ML-% IV SOLN
2.0000 g | Freq: Three times a day (TID) | INTRAVENOUS | Status: AC
  Administered 2018-01-28 (×2): 2 g via INTRAVENOUS
  Filled 2018-01-28 (×2): qty 100

## 2018-01-28 MED ORDER — PHENOL 1.4 % MT LIQD: 1.0000 | OROMUCOSAL | Status: DC | PRN

## 2018-01-28 MED ORDER — CEFAZOLIN SODIUM-DEXTROSE 2-3 GM-%(50ML) IV SOLR
INTRAVENOUS | Status: DC | PRN
  Administered 2018-01-28: 2 g via INTRAVENOUS

## 2018-01-28 MED ORDER — HYDROMORPHONE HCL 1 MG/ML IJ SOLN: 0.2500 mg | INTRAMUSCULAR | Status: DC | PRN

## 2018-01-28 MED ORDER — PROPOFOL 10 MG/ML IV BOLUS
INTRAVENOUS | Status: DC | PRN
  Administered 2018-01-28: 120 mg via INTRAVENOUS

## 2018-01-28 MED ORDER — HYDRALAZINE HCL 20 MG/ML IJ SOLN: 5.0000 mg | INTRAMUSCULAR | Status: DC | PRN

## 2018-01-28 MED ORDER — SODIUM CHLORIDE 0.9 % IV SOLN: INTRAVENOUS | Status: DC

## 2018-01-28 MED ORDER — ROCURONIUM BROMIDE 100 MG/10ML IV SOLN
INTRAVENOUS | Status: DC | PRN
  Administered 2018-01-28 (×3): 10 mg via INTRAVENOUS
  Administered 2018-01-28: 50 mg via INTRAVENOUS
  Administered 2018-01-28 (×3): 10 mg via INTRAVENOUS

## 2018-01-28 MED ORDER — THROMBIN (RECOMBINANT) 20000 UNITS EX SOLR
CUTANEOUS | Status: AC
  Filled 2018-01-28: qty 20000

## 2018-01-28 MED ORDER — SODIUM CHLORIDE 0.9 % IV SOLN
500.0000 mL | Freq: Once | INTRAVENOUS | Status: AC | PRN
  Administered 2018-01-28: 1000 mL via INTRAVENOUS

## 2018-01-28 MED ORDER — SUGAMMADEX SODIUM 200 MG/2ML IV SOLN
INTRAVENOUS | Status: DC | PRN
  Administered 2018-01-28: 132 mg via INTRAVENOUS

## 2018-01-28 MED ORDER — ASPIRIN EC 81 MG PO TBEC
81.0000 mg | DELAYED_RELEASE_TABLET | Freq: Every day | ORAL | Status: DC
  Administered 2018-01-29 – 2018-01-31 (×3): 81 mg via ORAL
  Filled 2018-01-28 (×3): qty 1

## 2018-01-28 MED ORDER — OXYCODONE-ACETAMINOPHEN 5-325 MG PO TABS
1.0000 | ORAL_TABLET | ORAL | Status: DC | PRN
  Administered 2018-01-28 – 2018-01-31 (×8): 2 via ORAL
  Filled 2018-01-28 (×9): qty 2

## 2018-01-28 MED ORDER — HYDROCHLOROTHIAZIDE 25 MG PO TABS
25.0000 mg | ORAL_TABLET | Freq: Every day | ORAL | Status: DC
  Administered 2018-01-29 – 2018-01-31 (×3): 25 mg via ORAL
  Filled 2018-01-28 (×3): qty 1

## 2018-01-28 MED ORDER — PANTOPRAZOLE SODIUM 40 MG PO TBEC
40.0000 mg | DELAYED_RELEASE_TABLET | Freq: Every day | ORAL | Status: DC
  Administered 2018-01-28 – 2018-01-31 (×4): 40 mg via ORAL
  Filled 2018-01-28 (×4): qty 1

## 2018-01-28 SURGICAL SUPPLY — 68 items
BANDAGE ESMARK 6X9 LF (GAUZE/BANDAGES/DRESSINGS) IMPLANT
BNDG ESMARK 6X9 LF (GAUZE/BANDAGES/DRESSINGS)
CANISTER SUCT 3000ML PPV (MISCELLANEOUS) ×3 IMPLANT
CLIP FOGARTY SPRING 6M (CLIP) IMPLANT
CLIP VESOCCLUDE MED 24/CT (CLIP) ×3 IMPLANT
CLIP VESOCCLUDE SM WIDE 24/CT (CLIP) ×3 IMPLANT
COVER PROBE W GEL 5X96 (DRAPES) ×3 IMPLANT
COVER WAND RF STERILE (DRAPES) ×3 IMPLANT
CUFF TOURNIQUET SINGLE 24IN (TOURNIQUET CUFF) IMPLANT
CUFF TOURNIQUET SINGLE 34IN LL (TOURNIQUET CUFF) IMPLANT
CUFF TOURNIQUET SINGLE 44IN (TOURNIQUET CUFF) IMPLANT
DERMABOND ADVANCED (GAUZE/BANDAGES/DRESSINGS) ×2
DERMABOND ADVANCED .7 DNX12 (GAUZE/BANDAGES/DRESSINGS) ×4 IMPLANT
DRAIN CHANNEL 15F RND FF W/TCR (WOUND CARE) IMPLANT
DRAPE C-ARM 42X72 X-RAY (DRAPES) IMPLANT
DRAPE HALF SHEET 40X57 (DRAPES) IMPLANT
ELECT REM PT RETURN 9FT ADLT (ELECTROSURGICAL) ×3
ELECTRODE REM PT RTRN 9FT ADLT (ELECTROSURGICAL) ×2 IMPLANT
EVACUATOR SILICONE 100CC (DRAIN) IMPLANT
GLOVE BIO SURGEON STRL SZ 6.5 (GLOVE) ×3 IMPLANT
GLOVE BIO SURGEON STRL SZ7 (GLOVE) ×3 IMPLANT
GLOVE BIO SURGEON STRL SZ7.5 (GLOVE) ×9 IMPLANT
GLOVE BIOGEL PI IND STRL 6.5 (GLOVE) ×4 IMPLANT
GLOVE BIOGEL PI IND STRL 8 (GLOVE) ×2 IMPLANT
GLOVE BIOGEL PI INDICATOR 6.5 (GLOVE) ×2
GLOVE BIOGEL PI INDICATOR 8 (GLOVE) ×1
GOWN STRL REUS W/ TWL LRG LVL3 (GOWN DISPOSABLE) ×6 IMPLANT
GOWN STRL REUS W/ TWL XL LVL3 (GOWN DISPOSABLE) ×4 IMPLANT
GOWN STRL REUS W/TWL LRG LVL3 (GOWN DISPOSABLE) ×3
GOWN STRL REUS W/TWL XL LVL3 (GOWN DISPOSABLE) ×2
GRAFT PROPATEN W/RING 6X80X60 (Vascular Products) ×3 IMPLANT
HEMOSTAT SPONGE AVITENE ULTRA (HEMOSTASIS) IMPLANT
HEMOSTAT SURGICEL 2X14 (HEMOSTASIS) ×3 IMPLANT
INSERT FOGARTY SM (MISCELLANEOUS) IMPLANT
KIT BASIN OR (CUSTOM PROCEDURE TRAY) ×3 IMPLANT
KIT TURNOVER KIT B (KITS) ×3 IMPLANT
NS IRRIG 1000ML POUR BTL (IV SOLUTION) ×6 IMPLANT
PACK PERIPHERAL VASCULAR (CUSTOM PROCEDURE TRAY) ×3 IMPLANT
PAD ARMBOARD 7.5X6 YLW CONV (MISCELLANEOUS) ×6 IMPLANT
PATCH VASC XENOSURE 1CMX6CM (Vascular Products) ×1 IMPLANT
PATCH VASC XENOSURE 1X6 (Vascular Products) ×2 IMPLANT
SET MICROPUNCTURE 5F STIFF (MISCELLANEOUS) IMPLANT
SPONGE LAP 18X18 RF (DISPOSABLE) ×3 IMPLANT
SPONGE SURGIFOAM ABS GEL 100 (HEMOSTASIS) ×3 IMPLANT
STOPCOCK 4 WAY LG BORE MALE ST (IV SETS) IMPLANT
SURGICEL SNOW 2X4 (HEMOSTASIS) ×3 IMPLANT
SUT ETHILON 3 0 PS 1 (SUTURE) IMPLANT
SUT GORETEX 5 0 TT13 24 (SUTURE) IMPLANT
SUT GORETEX 6.0 TT13 (SUTURE) IMPLANT
SUT GORETEX 6.0 TT9 (SUTURE) ×3 IMPLANT
SUT GORETEX CV-6TTC-13 36IN (SUTURE) ×6 IMPLANT
SUT MNCRL AB 4-0 PS2 18 (SUTURE) ×6 IMPLANT
SUT PROLENE 5 0 C 1 24 (SUTURE) ×3 IMPLANT
SUT PROLENE 6 0 BV (SUTURE) ×36 IMPLANT
SUT PROLENE 7 0 BV 1 (SUTURE) IMPLANT
SUT SILK 2 0 PERMA HAND 18 BK (SUTURE) ×3 IMPLANT
SUT SILK 3 0 (SUTURE)
SUT SILK 3-0 18XBRD TIE 12 (SUTURE) IMPLANT
SUT VIC AB 2-0 CT1 27 (SUTURE) ×3
SUT VIC AB 2-0 CT1 TAPERPNT 27 (SUTURE) ×6 IMPLANT
SUT VIC AB 3-0 SH 27 (SUTURE) ×2
SUT VIC AB 3-0 SH 27X BRD (SUTURE) ×4 IMPLANT
TAPE UMBILICAL COTTON 1/8X30 (MISCELLANEOUS) IMPLANT
TOWEL GREEN STERILE (TOWEL DISPOSABLE) ×3 IMPLANT
TRAY FOLEY MTR SLVR 16FR STAT (SET/KITS/TRAYS/PACK) ×3 IMPLANT
TUBING EXTENTION W/L.L. (IV SETS) IMPLANT
UNDERPAD 30X30 (UNDERPADS AND DIAPERS) ×3 IMPLANT
WATER STERILE IRR 1000ML POUR (IV SOLUTION) ×3 IMPLANT

## 2018-01-28 NOTE — Anesthesia Procedure Notes (Signed)
Procedure Name: Intubation Date/Time: 01/28/2018 7:36 AM Performed by: Marena ChancyBeckner, Alston Berrie S, CRNA Pre-anesthesia Checklist: Patient identified, Emergency Drugs available, Suction available and Patient being monitored Patient Re-evaluated:Patient Re-evaluated prior to induction Oxygen Delivery Method: Circle system utilized Preoxygenation: Pre-oxygenation with 100% oxygen Induction Type: IV induction Ventilation: Mask ventilation without difficulty and Oral airway inserted - appropriate to patient size Laryngoscope Size: Hyacinth MeekerMiller and 2 Grade View: Grade I Tube type: Oral Tube size: 7.5 mm Number of attempts: 1 Airway Equipment and Method: Stylet Placement Confirmation: ETT inserted through vocal cords under direct vision,  positive ETCO2 and breath sounds checked- equal and bilateral Secured at: 22 cm Tube secured with: Tape Dental Injury: Teeth and Oropharynx as per pre-operative assessment  Comments: Performed  By Rona RavensLuke Mellish SRNA

## 2018-01-28 NOTE — Anesthesia Preprocedure Evaluation (Signed)
Anesthesia Evaluation  Patient identified by MRN, date of birth, ID band Patient awake    Reviewed: Allergy & Precautions, NPO status , Patient's Chart, lab work & pertinent test results, reviewed documented beta blocker date and time   Airway Mallampati: II  TM Distance: >3 FB Neck ROM: Full    Dental  (+) Edentulous Upper   Pulmonary neg pulmonary ROS, Current Smoker, former smoker,    breath sounds clear to auscultation       Cardiovascular hypertension, Pt. on home beta blockers and Pt. on medications + Peripheral Vascular Disease   Rhythm:Regular Rate:Normal     Neuro/Psych negative neurological ROS     GI/Hepatic negative GI ROS, Neg liver ROS,   Endo/Other  negative endocrine ROS  Renal/GU negative Renal ROS     Musculoskeletal   Abdominal   Peds  Hematology negative hematology ROS (+)   Anesthesia Other Findings   Reproductive/Obstetrics                             Anesthesia Physical  Anesthesia Plan  ASA: III  Anesthesia Plan: General   Post-op Pain Management:    Induction: Intravenous  PONV Risk Score and Plan: 3 and Ondansetron, Dexamethasone, Treatment may vary due to age or medical condition and Midazolam  Airway Management Planned: Oral ETT  Additional Equipment:   Intra-op Plan:   Post-operative Plan: Extubation in OR  Informed Consent: I have reviewed the patients History and Physical, chart, labs and discussed the procedure including the risks, benefits and alternatives for the proposed anesthesia with the patient or authorized representative who has indicated his/her understanding and acceptance.   Dental advisory given  Plan Discussed with: CRNA  Anesthesia Plan Comments:         Anesthesia Quick Evaluation

## 2018-01-28 NOTE — OR Nursing (Signed)
To ensure compliance with patient wishes, I spoke with him in pre-op about the possibility of using a biologic patch. Instructed him to confirm acceptability with Dr. Chestine Sporelark. Patient verbalized understanding. Upon arrival to OR, I asked him if he had confirmed everything with Chestine Sporelark and patient nodded in the affirmative.   Sheffield SliderAlex Irving RN

## 2018-01-28 NOTE — Anesthesia Postprocedure Evaluation (Signed)
Anesthesia Post Note  Patient: Ryan [name]  Procedure(s) Performed: BYPASS GRAFT FEMORAL-BELOW KNEE POPLITEAL ARTERY REDO with propaten vascular graft removable ring (Left ) PATCH ANGIOPLASTY USING XENOSURE BIOLOGIC PATCH (Left Leg Lower)     Patient location during evaluation: PACU Anesthesia Type: General Level of consciousness: awake and alert Pain management: pain level controlled Vital Signs Assessment: post-procedure vital signs reviewed and stable Respiratory status: spontaneous breathing, nonlabored ventilation and respiratory function stable Cardiovascular status: blood pressure returned to baseline and stable Postop Assessment: no apparent nausea or vomiting Anesthetic complications: no    Last Vitals:  Vitals:   01/28/18 1315 01/28/18 1316  BP:  114/65  Pulse: (!) 54 (!) 58  Resp: 10 11  Temp:    SpO2: 100% 100%    Last Pain:  Vitals:   01/28/18 1300  TempSrc:   PainSc: 0-No pain                 Ryan [name]

## 2018-01-28 NOTE — Op Note (Addendum)
Date: January 28, 2018  Preoperative diagnosis: Critical limb ischemia of the left lower extremity with rest pain and occluded left femoral to below knee popliteal bypass  Postoperative diagnosis: Same  Procedure: 1.  Redo exposure of left common femoral artery 2.  Redo exposure of left popliteal artery 3.  Patch angioplasty of left popliteal artery using bovine pericardial patch 4.  Redo left common femoral to below-knee popliteal artery bypass (6 mm ringed PTFE)  Surgeon: Dr. Cephus Shellinghristopher J. Tyronza Happe, MD  Assistant: Aggie MoatsMatt Eveland, PA  Indications: Patient is a 67 year old male who previously underwent a left common femoral to below-knee popliteal bypass with prosthetic last year by Dr. Imogene Burnhen.  He recently presented to clinic with claudication progressing to rest pain and underwent arteriogram that confirmed his bypass was occluded.  He presents today for redo left common femoral to below-knee popliteal bypass after discussing risks and benefits including the high risk for graft failure, poor long-term patency, risk of infection, need for additional procedure, etc.  Findings: Redo exposure of left common femoral artery where there was severe scar tissue.  Redo exposure of the left popliteal artery that was also severely scarred.  The below-knee popliteal artery was only about 2-1/2 to 3 mm and we performed a patch angioplasty using bovine pericardial patch in order to have enough room to sew the hood of our graft.  After left common femoral to below-knee popliteal bypass with 6 mm ringed PTFE patient had a brisk posterior tibial signal.  Anesthesia: General  Details: The patient was taken to the operating room after informed consent was obtained.  He was placed on operating table in supine position and his left leg and left groin were prepped and draped in the usual sterile fashion.  An appropriate preoperative timeout was performed to identify patient, procedure and site.  Initially made a  longitudinal incision over his left common femoral artery at the site of his previous vertical incision.  This dissection was carried down to the hood of the graft as well as the common femoral artery using Bovie cautery and blunt dissection.  Ultimately we dissected out the distal external iliac as well as common femoral, SFA, profunda, and hood of the graft.  All these were individually controlled with Vesseloops.  Then turned our attention to the below-knee popliteal artery and made a longitudinal incision at the previous incision about one fingerbreadth behind the tibia.  Ultimately this was carried down through the scar tissue and entered the popliteal space.  It should be noted that there was significant scar tissue here and ultimately once we found the graft we carried our dissection down to the native popliteal artery.  The popliteal veins were very stuck and we had to do a lot of dissection bluntly to try and get the veins freed off the artery.  We did have several small holes in the vein that were repaired with interrupted 5-0 Prolenes.  Once we got enough room, we took down some of the soleus muscle in order to get distal dissection where there was a safer plane for dissection and ultimately the distal below-knee popliteal artery as well as the graft and more proximally were all controlled with Vesseloops.  At that point in time I then used my finger for blunt dissection behind the heads of the gastrocnemius muscle and tunneled from the popliteal space in the subsartorial plane up to the groin with a gore tunneler.  At that point in time we then passed a 6 mm ringed  PTFE graft through the tunneler and removed the tunneler.  I then went down after giving the patient 8000 units of IV heparin and checking an ACT and clamped the distal popliteal artery as well as the graft and inflow and ultimately using a 11 blade scalpel opened the below-knee popliteal artery and then carried our arteriotomy up toward the  hood of the previous graft.  I elected to use a bovine pericardial patch here because the artery was only about 2.5 millimeters in diameter.  This was sewn in place with a running 6-0 Prolene after it was trimmed and accordingly and parachuted in place.  I then went up to the groin after getting control of the SFA and profunda and putting clamp on the distal external iliac I opened the common femoral artery and detached previous graft.  Ultimately the common femoral artery overall did not have a lot of significant disease but there was a lot of intimal hyperplasia in the old graft.  I then spatulated the graft and sewed the new graft end to side at the proximal anastomosis using a CV 6 Gore suture.  There was some backbleeding from the old graft so we ligated this with a 2-0 silk tie.  I then went down to the below-knee popliteal artery we have placed a bovine patch in the artery and then after pulling appropriate tension with the leg straight and sewed the distal anastomosis using a CV 6 Gore suture as well after the patch was opened with a longitudinal arteriotomy and the graft was spatulated accordingly.  Hemostasis was obtained with Surgicel, Surgicel snow and thrombin Gelfoam.  Patient was given protamine after we had a brisk posterior tibial signal in the foot give.  Patient only had peroneal and posterior tibial runoff.  All the wounds were then copiously irrigated with saline until the effluent was clear.  The groin was then closed in multiple layers of 2-0 Vicryl 3-0 Vicryl and 4-0 Monocryl and Dermabond.  The below-knee popliteal incision was closed with a 2-0 Vicryl 3-0 Vicryl 4-0 Monocryl and Dermabond.  Again checked the posterior tibial signal at the completion of the case and he had a posterior tibial signal that was brisk and we confirmed this was graft dependent prior to closure.  Taken the PACU in stable condition.   Condition: Stable  Cephus Shellinghristopher J. Emon Miggins, MD [facility]  [place] Office: [phone] Pager: [phone]  Cephus Shellinghristopher J [address] [place]

## 2018-01-28 NOTE — H&P (Signed)
H&P      MRN #:  [id number]  History of Present Illness: [name] with hx of CLI of LLE with occluded left fem BK pop prosthetic bypass.  Now having rest pain and states cannot tolerate symptoms.  Underwent arteriogram recently with confirmation of occluded bypass initially performed by Dr. Imogene [name] in 11/2016.  Past Medical History:  Diagnosis Date  . Hypertension   . PVD (peripheral vascular disease) (HCC)     Past Surgical History:  Procedure Laterality Date  . ABDOMINAL AORTAGRAM Left 12/13/2016   ABDOMINAL AORTOGRAM W/LOWER EXTREMITY/notes 12/13/2016  . ABDOMINAL AORTOGRAM W/LOWER EXTREMITY N/A 11/28/2016   Procedure: ABDOMINAL AORTOGRAM W/LOWER EXTREMITY;  Surgeon: Ryan Decamp, MD;  Location: [facility] INVASIVE CV LAB;  Service: Cardiovascular;  Laterality: N/A;  bilateral  . ABDOMINAL AORTOGRAM W/LOWER EXTREMITY N/A 12/13/2016   Procedure: ABDOMINAL AORTOGRAM W/LOWER EXTREMITY;  Surgeon: Ryan Harman, MD;  Location: [facility] INVASIVE CV LAB;  Service: Cardiovascular;  Laterality: N/A;  unilater left leg  . ABDOMINAL AORTOGRAM W/LOWER EXTREMITY N/A 01/09/2018   Procedure: ABDOMINAL AORTOGRAM W/LOWER EXTREMITY;  Surgeon: Ryan Shelling, MD;  Location: [facility] INVASIVE CV LAB;  Service: Cardiovascular;  Laterality: N/A;  . FEMORAL-POPLITEAL BYPASS GRAFT Left 12/14/2016   Procedure: BYPASS GRAFT COMMON FEMORAL- BELOW KNEE POPLITEAL ARTERY with Bovine Patch to Below the knee poplitieal artery.;  Surgeon: Ryan Hertz, MD;  Location: [facility] OR;  Service: Vascular;  Laterality: Left;  . FEMUR SURGERY Right 2009   pt. has a rod in that leg  . LOWER EXTREMITY ANGIOGRAPHY Left 11/28/2016   Procedure: Lower Extremity Angiography;  Surgeon: Ryan Decamp, MD;  Location: [facility] INVASIVE CV LAB;  Service: Cardiovascular;  Laterality: Left;  lysis followup lower leg  . PERIPHERAL VASCULAR BALLOON ANGIOPLASTY  11/28/2016   Procedure: PERIPHERAL VASCULAR BALLOON ANGIOPLASTY;  Surgeon: Ryan Decamp, MD;  Location: [facility] INVASIVE CV LAB;  Service: Cardiovascular;;  SFA  . PERIPHERAL VASCULAR CATHETERIZATION N/A 08/11/2014   Procedure: Lower Extremity Angiography;  Surgeon: Ryan Decamp, MD;  Location: [facility] INVASIVE CV LAB;  Service: Cardiovascular;  Laterality: N/A;  . PERIPHERAL VASCULAR THROMBECTOMY  11/28/2016   Procedure: PERIPHERAL VASCULAR THROMBECTOMY;  Surgeon: Ryan Decamp, MD;  Location: [facility] INVASIVE CV LAB;  Service: Cardiovascular;;  Profunda  . PERIPHERAL VASCULAR THROMBECTOMY  11/28/2016   Procedure: PERIPHERAL VASCULAR THROMBECTOMY;  Surgeon: Ryan Decamp, MD;  Location: [facility] INVAS[facility] CV [facility];  Service: Cardiovascular;;  . THROMBECTOMY FEMORAL ARTERY Left 12/14/2016   Procedure: THROMBECTOMY PROFUNDA FEMORAL ARTERY;  Surgeon: Ryan Hertz, MD;  Location: [facility] OR;  Service: Vascular;  Laterality: Left;    No Known Allergies  Prior to Admission medications   Medication Sig Start Date End Date Taking? Authorizing Provider  aspirin 81 MG tablet Take 81 mg by mouth daily.   Yes [provider]  atenolol (TENORMIN) 50 MG tablet Take 50 mg by mouth daily.   Yes [provider]  hydrochlorothiazide (HYDRODIURIL) 25 MG tablet Take 25 mg by mouth daily.   Yes [provider]  losartan (COZAAR) 100 MG tablet Take 100 mg by mouth daily.   Yes [provider]  rosuvastatin (CRESTOR) 20 MG tablet Take 20 mg by mouth daily. 10/26/16  Yes [provider]  clopidogrel (PLAVIX) 75 MG tablet Take 1 tablet (75 mg total) by mouth daily. Patient not taking: Reported on 01/07/2018 08/11/14   Ryan Decamp, MD  feeding supplement, ENSURE ENLIVE, (ENSURE ENLIVE) LIQD Take 237 mLs 2 (two) times  daily between meals by mouth. Patient not taking: Reported on 01/07/2018 12/18/16   Ryan [name], Ryan J, PA-C  nitroGLYCERIN (NITRODUR - DOSED IN MG/24 HR) 0.2 mg/hr patch Place 1 patch (0.2 mg total) onto the skin daily. Patient not taking: Reported on 01/07/2018 11/30/16   Ryan DecampGanji, Jay,  MD    Social History   Socioeconomic History  . Marital status: Married    Spouse name: Not on file  . Number of children: Not on file  . Years of education: Not on file  . Highest education level: Not on file  Occupational History  . Not on file  Social Needs  . Financial resource strain: Not on file  . Food insecurity:    Worry: Not on file    Inability: Not on file  . Transportation needs:    Medical: Not on file    Non-medical: Not on file  Tobacco Use  . Smoking status: Current Every Day Smoker    Packs/day: 1.00    Years: 49.00    Pack years: 49.00    Types: Cigarettes    Last attempt to quit: 11/10/2016    Years since quitting: 1.2  . Smokeless tobacco: Never Used  Substance and Sexual Activity  . Alcohol use: Yes    Alcohol/week: 1.0 - 2.0 standard drinks    Types: 1 - 2 Shots of liquor per week    Comment:  1-2 shots per night  . Drug use: No  . Sexual activity: Not on file  Lifestyle  . Physical activity:    Days per week: Not on file    Minutes per session: Not on file  . Stress: Not on file  Relationships  . Social connections:    Talks on phone: Not on file    Gets together: Not on file    Attends religious service: Not on file    Active member of club or organization: Not on file    Attends meetings of clubs or organizations: Not on file    Relationship status: Not on file  . Intimate partner violence:    Fear of current or ex partner: Not on file    Emotionally abused: Not on file    Physically abused: Not on file    Forced sexual activity: Not on file  Other Topics Concern  . Not on file  Social History Narrative  . Not on file     History reviewed. No pertinent family history.  ROS: [x]  Positive   [ ]  Negative   [ ]  All sytems reviewed and are negative  Cardiovascular: []  chest pain/pressure []  palpitations []  SOB lying flat []  DOE [x]  pain in legs while walking [x]  pain in legs at rest []  pain in legs at night []  non-healing  ulcers []  hx of DVT []  swelling in legs  Pulmonary: []  productive cough []  asthma/wheezing []  home O2  Neurologic: []  weakness in []  arms []  legs []  numbness in []  arms []  legs []  hx of CVA []  mini stroke [] difficulty speaking or slurred speech []  temporary loss of vision in one eye []  dizziness  Hematologic: []  hx of cancer []  bleeding problems []  problems with blood clotting easily  Endocrine:   []  diabetes []  thyroid disease  GI []  vomiting blood []  blood in stool  GU: []  CKD/renal failure []  HD--[]  M/W/F or []  T/T/S []  burning with urination []  blood in urine  Psychiatric: []  anxiety []  depression  Musculoskeletal: []  arthritis []  joint pain  Integumentary: []   rashes []  ulcers  Constitutional: []  fever []  chills   Physical Examination  Vitals:   01/28/18 0600  BP: (!) 178/88  Pulse: 62  Resp: 18  Temp: 97.8 F (36.6 C)  SpO2: 98%   Body mass index is 23.47 kg/m.  General:  WDWN in NAD Gait: Not observed HENT: WNL, normocephalic Pulmonary: normal non-labored breathing, without Rales, rhonchi,  wheezing Cardiac: regular, without  Murmurs, rubs or gallops;  Abdomen:  soft, NT/ND, no masses Vascular Exam/Pulses:  Right Left  Radial    Ulnar    Femoral 2+ (normal) 2+ (normal)  Popliteal    DP Not palpable Not palpable  PT Not palpable Not palpable    Musculoskeletal: no muscle wasting or atrophy  Neurologic: A&O X 3; Appropriate Affect ; SENSATION: normal; MOTOR FUNCTION:  moving all extremities equally. Speech is fluent/normal   CBC    Component Value Date/Time   WBC 5.7 01/17/2018 0950   RBC 5.74 01/17/2018 0950   HGB 15.5 01/17/2018 0950   HCT 50.9 01/17/2018 0950   PLT 284 01/17/2018 0950   MCV 88.7 01/17/2018 0950   MCH 27.0 01/17/2018 0950   MCHC 30.5 01/17/2018 0950   RDW 14.4 01/17/2018 0950    BMET    Component Value Date/Time   NA 139 01/17/2018 0950   K 4.0 01/17/2018 0950   CL 103 01/17/2018 0950   CO2  27 01/17/2018 0950   GLUCOSE 135 (H) 01/17/2018 0950   BUN 17 01/17/2018 0950   CREATININE 1.05 01/17/2018 0950   CALCIUM 9.6 01/17/2018 0950   GFRNONAA >60 01/17/2018 0950   GFRAA >60 01/17/2018 0950    COAGS: Lab Results  Component Value Date   INR 0.97 01/17/2018   INR 0.9 06/16/2008   INR 1.9 (H) 12/31/2007     ASSESSMENT/PLAN: [name] that presents for re-do left common femoral to BK pop bypass.  Understands this is a prosthetic bypass given no usable vein and poor long term patency.  States cannot live with symptoms otherwise.  Has two vessel runoff via PT and peroneal.  Discussed risk of bleeding, infection, limb loss, need for additional procedure, failure of bypass.     Ryan Shellinghristopher [name]. Kaley Jutras, MD [facility] [place] Office: [phone] Pager: [phone]

## 2018-01-28 NOTE — Transfer of Care (Signed)
Immediate Anesthesia Transfer of Care Note  Patient: Ryan [name]  Procedure(s) Performed: BYPASS GRAFT FEMORAL-BELOW KNEE POPLITEAL ARTERY REDO with propaten vascular graft removable ring (Left ) PATCH ANGIOPLASTY USING XENOSURE BIOLOGIC PATCH (Left Leg Lower)  Patient Location: PACU  Anesthesia Type:General  Level of Consciousness: patient cooperative and responds to stimulation  Airway & Oxygen Therapy: Patient Spontanous Breathing and Patient connected to face mask oxygen  Post-op Assessment: Report given to RN and Post -op Vital signs reviewed and stable  Post vital signs: Reviewed and stable  Last Vitals:  Vitals Value Taken Time  BP 128/65 01/28/2018 12:01 PM  Temp    Pulse 56 01/28/2018 12:06 PM  Resp 11 01/28/2018 12:06 PM  SpO2 100 % 01/28/2018 12:06 PM  Vitals shown include unvalidated device data.  Last Pain:  Vitals:   01/28/18 0600  TempSrc: Oral  PainSc:       Patients Stated Pain Goal: 8 (01/28/18 0558)  Complications: No apparent anesthesia complications

## 2018-01-29 ENCOUNTER — Encounter (HOSPITAL_COMMUNITY): Payer: Self-pay | Admitting: Vascular Surgery

## 2018-01-29 ENCOUNTER — Other Ambulatory Visit: Payer: Self-pay

## 2018-01-29 LAB — BASIC METABOLIC PANEL
Anion gap: 9 (ref 5–15)
BUN: 23 mg/dL (ref 8–23)
CO2: 24 mmol/L (ref 22–32)
Calcium: 7.9 mg/dL — ABNORMAL LOW (ref 8.9–10.3)
Chloride: 104 mmol/L (ref 98–111)
Creatinine, Ser: 1.21 mg/dL (ref 0.61–1.24)
GFR calc Af Amer: >60 mL/min (ref >60)
GFR calc non Af Amer: >60 mL/min (ref >60)
GLUCOSE: 142 mg/dL — AB (ref 70–99)
Potassium: 3 mmol/L — ABNORMAL LOW (ref 3.5–5.1)
Sodium: 137 mmol/L (ref 135–145)

## 2018-01-29 LAB — CBC
HCT: 28.4 % — ABNORMAL LOW (ref 39.0–52.0)
Hemoglobin: 9 g/dL — ABNORMAL LOW (ref 13.0–17.0)
MCH: 27.6 pg (ref 26.0–34.0)
MCHC: 31.7 g/dL (ref 30.0–36.0)
MCV: 87.1 fL (ref 80.0–100.0)
Platelets: 181 10*3/uL (ref 150–400)
RBC: 3.26 MIL/uL — ABNORMAL LOW (ref 4.22–5.81)
RDW: 14.4 % (ref 11.5–15.5)
WBC: 10.7 10*3/uL — ABNORMAL HIGH (ref 4.0–10.5)
nRBC: 0 % (ref 0.0–0.2)

## 2018-01-29 MED FILL — Thrombin (Recombinant) For Soln 20000 Unit: CUTANEOUS | Qty: 1 | Status: AC

## 2018-01-29 NOTE — Evaluation (Signed)
Occupational Therapy Evaluation Patient Details Name: Ryan [name] MRN: [id number] DOB: 05/14/1950 Today's Date: 01/29/2018    History of Present Illness 67 y.o. male is s/p redo L fem- BK pop bypass with PTFE   Clinical Impression   Pt with decline in function and safety with ADLs and ADL mobility with decreased balance and endurance. Pt reports 9/10 pain in R foot, however is moving well and able to perform LB ADLs with min A and toilet transfers min guard A. Pt would benefit from acute OT services to address impairments to maximize level of function and safety    Follow Up Recommendations  No OT follow up;Supervision - Intermittent    Equipment Recommendations  Tub/shower seat;Other (comment)(ADL A/E)    Recommendations for Other Services       Precautions / Restrictions Precautions Precautions: Fall Restrictions Weight Bearing Restrictions: No      Mobility Bed Mobility               General bed mobility comments: received in chair  Transfers Overall transfer level: Needs assistance Equipment used: Rolling walker (2 wheeled) Transfers: Sit to/from Stand Sit to Stand: Min guard         General transfer comment: min guard for safety and stability performed from recliner, from chair, from toilet. No physical assist    Balance Overall balance assessment: Needs assistance Sitting-balance support: Feet supported Sitting balance-Leahy Scale: Good     Standing balance support: Bilateral upper extremity supported;During functional activity Standing balance-Leahy Scale: Poor Standing balance comment: reliance on UE support                           ADL either performed or assessed with clinical judgement   ADL Overall ADL's : Needs assistance/impaired Eating/Feeding: Independent;Sitting   Grooming: Wash/dry hands;Wash/dry face;Min guard;Standing   Upper Body Bathing: Set up;Sitting   Lower Body Bathing: Minimal assistance;Sitting/lateral  leans   Upper Body Dressing : Set up;Sitting   Lower Body Dressing: Minimal assistance;Sitting/lateral leans;Sit to/from stand   Toilet Transfer: Min guard;Ambulation;RW;Comfort height toilet;Grab bars;Cueing for safety   Toileting- Clothing Manipulation and Hygiene: Minimal assistance;Sit to/from stand   Tub/ Shower Transfer: Min guard;Ambulation;Rolling walker;3 in 1;Grab bars   Functional mobility during ADLs: Rolling walker;Min guard;Cueing for safety       Vision Patient Visual Report: No change from baseline       Perception     Praxis      Pertinent Vitals/Pain Pain Assessment: 0-10 Pain Score: 9  Pain Location: L foot Pain Descriptors / Indicators: Sore;Throbbing Pain Intervention(s): Monitored during session;Repositioned     Hand Dominance Right   Extremity/Trunk Assessment Upper Extremity Assessment Upper Extremity Assessment: Generalized weakness   Lower Extremity Assessment Lower Extremity Assessment: Defer to PT evaluation LLE: Unable to fully assess due to pain LLE Sensation: decreased light touch;history of peripheral neuropathy LLE Coordination: decreased fine motor;decreased gross motor   Cervical / Trunk Assessment Cervical / Trunk Assessment: Normal   Communication Communication Communication: No difficulties   Cognition Arousal/Alertness: Awake/alert Behavior During Therapy: WFL for tasks assessed/performed Overall Cognitive Status: Within Functional Limits for tasks assessed                                     General Comments   pt very pleasant, cooperative, motivated and Dispensing opticianjovial    Exercises     Shoulder Instructions  Home Living Family/patient expects to be discharged to:: Private residence Living Arrangements: Spouse/significant other;Children;Other relatives Available Help at Discharge: Family;Available PRN/intermittently Type of Home: House Home Access: Stairs to enter Enter[employer] CorporationEntrance Stairs-Number of Steps:  1   Home Layout: One level     Bathroom Shower/Tub: Chief Strategy OfficerTub/shower unit   Bathroom Toilet: Standard     Home Equipment: Environmental consultantWalker - 2 wheels          Prior Functioning/Environment Level of Independence: Independent        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, 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, 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, 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        OT Problem List: Decreased activity tolerance;Decreased knowledge of use of DME or AE;Impaired balance (sitting and/or standing);Decreased coordination;Pain      OT Treatment/Interventions: Self-care/ADL training;DME and/or AE instruction;Therapeutic activities;Patient/family education    OT Goals(Current goals can be found in the care plan section) Acute Rehab OT Goals Patient Stated Goal: go home OT Goal Formulation: With patient Time For Goal Achievement: 02/12/18 Potential to Achieve Goals: Good  OT Frequency: Min 2X/week   Barriers to D/C:    no barriers       Co-evaluation PT/OT/SLP Co-Evaluation/Treatment: Yes Reason for Co-Treatment: To address functional/ADL transfers;For patient/therapist safety   OT goals addressed during session: ADL's and self-care;Proper use of Adaptive equipment and DME      AM-PAC OT "6 Clicks" Daily Activity     Outcome Measure Help from another person eating meals?: None Help from another person taking care of personal grooming?: A Little Help from another person toileting, which includes using toliet, bedpan, or urinal?: A Little Help from another person bathing (including washing, rinsing, drying)?: A Little Help from another person to put on and taking off regular upper body clothing?: None Help from another person to put on and taking off regular lower body clothing?: A Little 6 Click Score: 20   End of Session Equipment Utilized During Treatment: Rolling walker;Other (comment)(3 in 1)  Activity Tolerance: Patient tolerated treatment well Patient left: in chair;with call bell/phone within reach  OT Visit Diagnosis: Unsteadiness on feet  (R26.81);Other abnormalities of gait and mobility (R26.89);Pain Pain - Right/Left: Left Pain - part of body: Ankle and joints of foot                Time: 4132-44011049-1114 OT Time Calculation (min): 25 min Charges:  OT General Charges $OT Visit: 1 Visit OT Evaluation $OT Eval Moderate Complexity: 1 Mod    Galen ManilaSpencer, Cher Egnor Jeanette 01/29/2018, 12:27 PM

## 2018-01-29 NOTE — Evaluation (Signed)
Physical Therapy Evaluation Patient Details Name: Ryan [name] MRN: [id number] DOB: 03/12/1950 Today's Date: 01/29/2018   History of Present Illness  67 y.o. male is s/p redo L fem- BK pop bypass with PTFE  Clinical Impression  Orders received for PT evaluation. Patient demonstrates deficits in functional mobility as indicated below. Will benefit from continued skilled PT to address deficits and maximize function. Will see as indicated and progress as tolerated.  Anticipate patient will continue to progress well, may benefit from HHPT for balance program to maximize recovery of functional independence.    Follow Up Recommendations Home health PT;Supervision - Intermittent    Equipment Recommendations  Rolling walker with 5" wheels    Recommendations for Other Services       Precautions / Restrictions Precautions Precautions: Fall Restrictions Weight Bearing Restrictions: No      Mobility  Bed Mobility               General bed mobility comments: received in chair  Transfers Overall transfer level: Needs assistance Equipment used: Rolling walker (2 wheeled) Transfers: Sit to/from Stand Sit to Stand: Min guard         General transfer comment: min guard for safety and stability performed from recliner, from chair, from toilet. No physical assist  Ambulation/Gait Ambulation/Gait assistance: Min guard Gait Distance (Feet): 90 Feet Assistive device: Rolling walker (2 wheeled) Gait Pattern/deviations: Step-to pattern;Decreased stride length;Antalgic Gait velocity: decreased   General Gait Details: patient with noted LLE self limited weight bearing but tolerated well  Stairs            Wheelchair Mobility    Modified Rankin (Stroke Patients Only)       Balance Overall balance assessment: Needs assistance Sitting-balance support: Feet supported Sitting balance-Leahy Scale: Good     Standing balance support: Bilateral upper extremity  supported;During functional activity Standing balance-Leahy Scale: Poor Standing balance comment: reliance on UE support                             Pertinent Vitals/Pain Pain Assessment: 0-10 Pain Score: 9  Pain Location: L foot Pain Descriptors / Indicators: Sore;Throbbing Pain Intervention(s): Monitored during session    Home Living Family/patient expects to be discharged to:: Private residence Living Arrangements: Spouse/significant other;Children;Other relatives Available Help at Discharge: Family;Available PRN/intermittently Type of Home: House Home Access: Stairs to enter   [employer] CorporationEntrance Stairs-Number of Steps: 1 Home Layout: One level Home Equipment: Environmental consultantWalker - 2 wheels      Prior Function Level of Independence: Independent         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, 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, 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, 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     Hand Dominance   Dominant Hand: Right    Extremity/Trunk Assessment   Upper Extremity Assessment Upper Extremity Assessment: Generalized weakness    Lower Extremity Assessment Lower Extremity Assessment: Generalized weakness LLE: Unable to fully assess due to pain LLE Sensation: decreased light touch;history of peripheral neuropathy LLE Coordination: decreased fine motor;decreased gross motor    Cervical / Trunk Assessment Cervical / Trunk Assessment: Normal  Communication   Communication: No difficulties  Cognition Arousal/Alertness: Awake/alert Behavior During Therapy: WFL for tasks assessed/performed Overall Cognitive Status: Within Functional Limits for tasks assessed                                        General Comments  Exercises     Assessment/Plan    PT Assessment Patient needs continued PT services  PT Problem List Decreased strength;Decreased activity tolerance;Decreased balance;Decreased mobility;Decreased safety awareness;Pain       PT Treatment Interventions DME instruction;Gait training;Functional mobility  training;Stair training;Therapeutic exercise;Therapeutic activities;Patient/family education;Balance training    PT Goals (Current goals can be found in the Care Plan section)  Acute Rehab PT Goals Patient Stated Goal: to go home PT Goal Formulation: With patient Time For Goal Achievement: 02/12/18 Potential to Achieve Goals: Good    Frequency Min 3X/week   Barriers to discharge        Co-evaluation PT/OT/SLP Co-Evaluation/Treatment: Yes Reason for Co-Treatment: To address functional/ADL transfers PT goals addressed during session: Mobility/safety with mobility OT goals addressed during session: ADL's and self-care       AM-PAC PT "6 Clicks" Mobility  Outcome Measure Help needed turning from your back to your side while in a flat bed without using bedrails?: A Lot Help needed moving from lying on your back to sitting on the side of a flat bed without using bedrails?: None Help needed moving to and from a bed to a chair (including a wheelchair)?: None Help needed standing up from a chair using your arms (e.g., wheelchair or bedside chair)?: A Little Help needed to walk in hospital room?: A Little Help needed climbing 3-5 steps with a railing? : A Little 6 Click Score: 19    End of Session Equipment Utilized During Treatment: Gait belt Activity Tolerance: Patient tolerated treatment well Patient left: in chair;with call bell/phone within reach Nurse Communication: Mobility status PT Visit Diagnosis: Unsteadiness on feet (R26.81);Difficulty in walking, not elsewhere classified (R26.2)    Time: [id number] PT Time Calculation (min) (ACUTE ONLY): 25 min   Charges:   PT Evaluation $PT Eval Moderate Complexity: 1 Mod          Charlotte Crumbevon Kenyon Eshleman, PT DPT  Board Certified Neurologic Specialist Acute Rehabilitation Services Pager [phone] Office [phone]   Fabio AsaDevon J Kevork Joyce 01/29/2018, 2:20 PM

## 2018-01-29 NOTE — Progress Notes (Addendum)
  Progress Note    01/29/2018 7:22 AM 1 Day Post-Op  Subjective:  Soreness to incision sites.   Vitals:   01/28/18 1504 01/28/18 1932  BP: 105/65 130/69  Pulse: 60 68  Resp: 13 14  Temp: 98.1 F (36.7 C) 98.2 F (36.8 C)  SpO2: 95% 92%   Physical Exam: Lungs:  Non labored Incisions:  L groin incision without hematoma or drainage; L popliteal incision c/d/i Extremities:  Palpable L PT; DP by doppler Neurologic: A&O  CBC    Component Value Date/Time   WBC 10.7 (H) 01/29/2018 0336   RBC 3.26 (L) 01/29/2018 0336   HGB 9.0 (L) 01/29/2018 0336   HCT 28.4 (L) 01/29/2018 0336   PLT 181 01/29/2018 0336   MCV 87.1 01/29/2018 0336   MCH 27.6 01/29/2018 0336   MCHC 31.7 01/29/2018 0336   RDW 14.4 01/29/2018 0336    BMET    Component Value Date/Time   NA 137 01/29/2018 0336   K 3.0 (L) 01/29/2018 0336   CL 104 01/29/2018 0336   CO2 24 01/29/2018 0336   GLUCOSE 142 (H) 01/29/2018 0336   BUN 23 01/29/2018 0336   CREATININE 1.21 01/29/2018 0336   CALCIUM 7.9 (L) 01/29/2018 0336   GFRNONAA >60 01/29/2018 0336   GFRAA >60 01/29/2018 0336    INR    Component Value Date/Time   INR 0.97 01/17/2018 0950     Intake/Output Summary (Last 24 hours) at 01/29/2018 [id number] Last data filed at 01/29/2018 0340 Gross per 24 hour  Intake 2001.15 ml  Output 1480 ml  Net 521.15 ml     Assessment/Plan:  67 y.o. male is s/p redo L fem- BK pop bypass with PTFE 1 Day Post-Op   Perfusing LLE with now palpable L PT Encouraged ambulation today D/c foley Home when pain controlled and mobility increased   Emilie RutterMatthew Eveland, PA-C [facility] [phone] 01/29/2018 7:22 AM  I have independently interviewed and examined patient and agree with PA assessment and plan above.   Evelia Waskey C. [name], MD [facility] [place] Office: [phone] Pager: [phone]

## 2018-01-30 LAB — CBC
HCT: 28.6 % — ABNORMAL LOW (ref 39.0–52.0)
Hemoglobin: 9.1 g/dL — ABNORMAL LOW (ref 13.0–17.0)
MCH: 28.2 pg (ref 26.0–34.0)
MCHC: 31.8 g/dL (ref 30.0–36.0)
MCV: 88.5 fL (ref 80.0–100.0)
Platelets: 184 10*3/uL (ref 150–400)
RBC: 3.23 MIL/uL — ABNORMAL LOW (ref 4.22–5.81)
RDW: 14.6 % (ref 11.5–15.5)
WBC: 11.3 10*3/uL — ABNORMAL HIGH (ref 4.0–10.5)
nRBC: 0 % (ref 0.0–0.2)

## 2018-01-30 MED ORDER — POLYETHYLENE GLYCOL 3350 17 G PO PACK
17.0000 g | PACK | Freq: Every day | ORAL | Status: DC
  Administered 2018-01-30 – 2018-01-31 (×2): 17 g via ORAL
  Filled 2018-01-30 (×2): qty 1

## 2018-01-30 NOTE — Progress Notes (Addendum)
  Progress Note    01/30/2018 7:48 AM 2 Days Post-Op  Subjective:  Still having pain.  Says he walked out in the hallway yesterday and did well.  Afebrile HR 50's 100's systolic 100% RA  Vitals:   01/29/18 1950 01/30/18 0330  BP: 106/62   Pulse: 60 62  Resp: 14   Temp: 98.1 F (36.7 C) 98.2 F (36.8 C)  SpO2: 100%     Physical Exam: Cardiac:  regular Lungs:  Non labored Incisions:  Left groin and leg incisions are clean and dry Extremities:  Easily palpable left DP pulse; erythema of left great toe and 2nd toe   CBC    Component Value Date/Time   WBC 11.3 (H) 01/30/2018 0323   RBC 3.23 (L) 01/30/2018 0323   HGB 9.1 (L) 01/30/2018 0323   HCT 28.6 (L) 01/30/2018 0323   PLT 184 01/30/2018 0323   MCV 88.5 01/30/2018 0323   MCH 28.2 01/30/2018 0323   MCHC 31.8 01/30/2018 0323   RDW 14.6 01/30/2018 0323    BMET    Component Value Date/Time   NA 137 01/29/2018 0336   K 3.0 (L) 01/29/2018 0336   CL 104 01/29/2018 0336   CO2 24 01/29/2018 0336   GLUCOSE 142 (H) 01/29/2018 0336   BUN 23 01/29/2018 0336   CREATININE 1.21 01/29/2018 0336   CALCIUM 7.9 (L) 01/29/2018 0336   GFRNONAA >60 01/29/2018 0336   GFRAA >60 01/29/2018 0336    INR    Component Value Date/Time   INR 0.97 01/17/2018 0950     Intake/Output Summary (Last 24 hours) at 01/30/2018 0748 Last data filed at 01/30/2018 0330 Gross per 24 hour  Intake 700 ml  Output 2450 ml  Net -1750 ml     Assessment:  68 y.o. male is s/p:  1.  Redo exposure of left common femoral artery 2.  Redo exposure of left popliteal artery 3.  Patch angioplasty of left popliteal artery using bovine pericardial patch 4.  Redo left common femoral to below-knee popliteal artery bypass (6 mm ringed PTFE)  2 Days Post-Op  Plan: -pt doing well this am with palpable left DP pulse -still with some erythema of the left great and 2nd toes but pain has improved. -PT recommending HHPT/RW/tub/shower seat.  Will place face to  face for home needs -DVT prophylaxis:  Sq heparin   Doreatha Massed, PA-C [facility] [phone] 01/30/2018 7:48 AM   I have interviewed and examined patient with PA and agree with assessment and plan above.   Brandon C. Randie Heinz, MD [facility] [place] Office: [phone] Pager: [phone]

## 2018-01-30 NOTE — Progress Notes (Addendum)
Pt ambulated approx 150' with assistance of front wheel walker.  Tolerated well, but cited pain as hindrance to walking farther. States he does not have a walker at home, will need one for discharge.

## 2018-01-30 NOTE — Care Management Note (Signed)
Case Management Note [name] RN, BSN Transitions of Care Unit 4E- RN Case Manager [phone]  Patient Details  Name: Treshun Giovino MRN: [id number] Date of Birth: 1950/09/11  Subjective/Objective:    Pt admitted s/p  Redo of fempop bypass               Action/Plan: PTA pt lived at home, states he has RW at home and declines need for shower stool as he takes "bird baths". Order placed for HHPT- CM has been notified by Tiffany with [facility] pt has preop referral to them for any HH needs- CM spoke with pt who confirmed he had been notified by [facility] of referral and states he is ok with using them for his HH needs- Cm did provide pt with CMS website list of [facility] agencies with star ratings for [facility]. CM will follow for any further transition of care needs  Expected Discharge Date:                  Expected Discharge Plan:  Home w Home Health Services  In-House Referral:     Discharge planning Services  CM Consult  Post Acute Care Choice:  Durable Medical Equipment, Home Health Choice offered to:  Patient  DME Arranged:  Walker rolling, Shower stool DME Agency:  NA  HH Arranged:  PT HH Agency:  [facility]  Status of Service:  Completed, signed off  If discussed at Long Length of Stay Meetings, dates discussed:    Discharge Disposition: home/home health   Additional Comments:  Darrold Span, RN 01/30/2018, 10:14 AM

## 2018-01-31 MED ORDER — OXYCODONE-ACETAMINOPHEN 5-325 MG PO TABS: 1.0000 | ORAL_TABLET | ORAL | 0 refills | Status: DC | PRN

## 2018-01-31 NOTE — Progress Notes (Signed)
Physical Therapy Treatment Patient Details Name: Ryan MedicusLevon [name] MRN: [id number] DOB: 04/15/1950 Today's Date: 01/31/2018    History of Present Illness 68 y.o. male is s/p redo L fem- BK pop bypass with PTFE    PT Comments    Pt making good progress. Ready for return home with family when medically ready.    Follow Up Recommendations  Home health PT;Supervision - Intermittent     Equipment Recommendations       Recommendations for Other Services       Precautions / Restrictions Precautions Precautions: Fall Restrictions Weight Bearing Restrictions: No    Mobility  Bed Mobility Overal bed mobility: Modified Independent                Transfers Overall transfer level: Needs assistance Equipment used: Rolling walker (2 wheeled) Transfers: Sit to/from Stand Sit to Stand: Supervision         General transfer comment: no physical assist  Ambulation/Gait Ambulation/Gait assistance: Supervision Gait Distance (Feet): 200 Feet Assistive device: Rolling walker (2 wheeled) Gait Pattern/deviations: Step-through pattern;Decreased weight shift to left;Antalgic Gait velocity: decreased Gait velocity interpretation: <1.31 ft/sec, indicative of household ambulator General Gait Details: supervision for safety   Stairs Stairs: (Pt/family report he has ramp at home)           Wheelchair Mobility    Modified Rankin (Stroke Patients Only)       Balance Overall balance assessment: Needs assistance Sitting-balance support: No upper extremity supported;Feet supported Sitting balance-Leahy Scale: Good     Standing balance support: Bilateral upper extremity supported Standing balance-Leahy Scale: Poor Standing balance comment: UE support                            Cognition Arousal/Alertness: Awake/alert Behavior During Therapy: WFL for tasks assessed/performed Overall Cognitive Status: Within Functional Limits for tasks assessed                                        Exercises      General Comments        Pertinent Vitals/Pain Pain Assessment: Faces Pain Score: 8  Faces Pain Scale: Hurts little more Pain Location: LLE Pain Descriptors / Indicators: Sore Pain Intervention(s): Monitored during session;Repositioned    Home Living                      Prior Function            PT Goals (current goals can now be found in the care plan section) Acute Rehab PT Goals Patient Stated Goal: to go home Progress towards PT goals: Progressing toward goals    Frequency    Min 3X/week      PT Plan Current plan remains appropriate    Co-evaluation              AM-PAC PT "6 Clicks" Mobility   Outcome Measure  Help needed turning from your back to your side while in a flat bed without using bedrails?: None Help needed moving from lying on your back to sitting on the side of a flat bed without using bedrails?: None Help needed moving to and from a bed to a chair (including a wheelchair)?: None Help needed standing up from a chair using your arms (e.g., wheelchair or bedside chair)?: None Help needed to walk in hospital room?: A  Little Help needed climbing 3-5 steps with a railing? : A Little 6 Click Score: 22    End of Session   Activity Tolerance: Patient tolerated treatment well Patient left: in bed;with call bell/phone within reach;with family/visitor present Nurse Communication: Mobility status PT Visit Diagnosis: Unsteadiness on feet (R26.81);Difficulty in walking, not elsewhere classified (R26.2)     Time: 6720-9470 PT Time Calculation (min) (ACUTE ONLY): 12 min  Charges:  $Gait Training: 8-22 mins                     [facility] PT Acute Rehabilitation Services Pager [phone] Office [phone]    Ryan [name] [facility] 01/31/2018, 2:49 PM

## 2018-01-31 NOTE — Progress Notes (Addendum)
[facility] [place]  Subjective  - slowly doing better.  Objective (!) 97/58 62 98.3 F (36.8 C) (Oral) 12 98% No intake or output data in the 24 hours ending 01/31/18 0707  Still having pain and numbness in the left foot, active motor in toes. Palpable DP left LE Left leg incisions healing well without hematoma  Assessment/Planning:  68 y.o. male is s/p:  1. Redo exposure of left common femoral artery 2. Redo exposure of left popliteal artery 3. Patch angioplasty of left popliteal artery using bovine pericardial patch 4. Redo left common femoral to below-knee popliteal artery bypass (6 mm ringed PTFE)  3 Days Post-Op  Pending pain control and mobility possible discharge tomorrow Erythema left GT and second toe    Ryan [name] 01/31/2018 7:07 AM --  Laboratory Lab Results: Recent Labs    01/29/18 0336 01/30/18 0323  WBC 10.7* 11.3*  HGB 9.0* 9.1*  HCT 28.4* 28.6*  PLT 181 184   BMET Recent Labs    01/28/18 1535 01/29/18 0336  NA  --  137  K  --  3.0*  CL  --  104  CO2  --  24  GLUCOSE  --  142*  BUN  --  23  CREATININE 1.01 1.21  CALCIUM  --  7.9*    COAG Lab Results  Component Value Date   INR 0.97 01/17/2018   INR 0.9 06/16/2008   INR 1.9 (H) 12/31/2007   No results found for: PTT   I have seen and evaluated the patient. I agree with the PA note as documented above. Palpable left DP.  Incisions looks great.  Depedent rubor improving.  Possible d/c home today.  Will plan f/u 3 weeks for wound/groin check.  Cephus Shelling, MD [facility] [place] Office: [phone] Pager: [phone]

## 2018-01-31 NOTE — Progress Notes (Signed)
Occupational Therapy Treatment Patient Details Name: Ryan [name] MRN: [id number] DOB: 10/21/1950 Today's Date: 01/31/2018    History of present illness 68 y.o. male is s/p redo L fem- BK pop bypass with PTFE   OT comments  Pt progressing and eager to return home with his wife who is bedside. Requires supervision to min guard assist fo OOB mobility for toileting and standing grooming and demonstrated ability to don socks. Educated pt and wife in use of 3 in 1. Pt does not desire to shower when he returns home. Encouraged pt to ambulate at home, go to table for meals, retrieve his own drink, go to bathroom.  Follow Up Recommendations  No OT follow up;Supervision - Intermittent    Equipment Recommendations  3 in 1 bedside commode    Recommendations for Other Services      Precautions / Restrictions Precautions Precautions: Fall       Mobility Bed Mobility Overal bed mobility: Modified Independent                Transfers Overall transfer level: Needs assistance Equipment used: Rolling walker (2 wheeled) Transfers: Sit to/from Stand Sit to Stand: Supervision         General transfer comment: no physical assist    Balance     Sitting balance-Leahy Scale: Good       Standing balance-Leahy Scale: Poor Standing balance comment: reliance on UE support                           ADL either performed or assessed with clinical judgement   ADL Overall ADL's : Needs assistance/impaired     Grooming: Wash/dry hands;Standing;Min guard           Upper Body Dressing : Set up;Sitting   Lower Body Dressing: Set up;Sitting/lateral leans Lower Body Dressing Details (indicate cue type and reason): socks Toilet Transfer: Min guard;Ambulation;RW Toilet Transfer Details (indicate cue type and reason): educated in use of 3 in 1 to elevate toilet       Tub/Shower Transfer Details (indicate cue type and reason): pt is not interested in showering Functional  mobility during ADLs: Min guard;Rolling walker General ADL Comments: Educated pt and wife in how to safely transport items with RW.     Vision       Perception     Praxis      Cognition Arousal/Alertness: Awake/alert Behavior During Therapy: WFL for tasks assessed/performed Overall Cognitive Status: Within Functional Limits for tasks assessed                                          Exercises     Shoulder Instructions       General Comments      Pertinent Vitals/ Pain       Pain Assessment: No/denies pain Pain Score: 8  Pain Location: L foot Pain Descriptors / Indicators: Sore Pain Intervention(s): Premedicated before session  Home Living                                          Prior Functioning/Environment              Frequency  Min 2X/week        Progress Toward Goals  OT  Goals(current goals can now be found in the care plan section)  Progress towards OT goals: Progressing toward goals  Acute Rehab OT Goals Patient Stated Goal: to go home OT Goal Formulation: With patient Time For Goal Achievement: 02/12/18 Potential to Achieve Goals: Good  Plan Discharge plan remains appropriate    Co-evaluation                 AM-PAC OT "6 Clicks" Daily Activity     Outcome Measure   Help from another person eating meals?: None Help from another person taking care of personal grooming?: A Little Help from another person toileting, which includes using toliet, bedpan, or urinal?: A Little Help from another person bathing (including washing, rinsing, drying)?: A Little Help from another person to put on and taking off regular upper body clothing?: None Help from another person to put on and taking off regular lower body clothing?: A Little 6 Click Score: 20    End of Session Equipment Utilized During Treatment: Rolling walker;Gait belt  OT Visit Diagnosis: Unsteadiness on feet (R26.81);Other abnormalities of  gait and mobility (R26.89);Pain Pain - Right/Left: Left Pain - part of body: Ankle and joints of foot   Activity Tolerance Patient tolerated treatment well   Patient Left in bed;with call bell/phone within reach;with family/visitor present   Nurse Communication          Time: 1194-1740 OT Time Calculation (min): 14 min  Charges: OT General Charges $OT Visit: 1 Visit OT Treatments $Self Care/Home Management : 8-22 mins  Martie Round, OTR/L Acute Rehabilitation Services Pager: [phone] Office: [phone]   Evern Bio 01/31/2018, 11:44 AM

## 2018-01-31 NOTE — Discharge Instructions (Signed)
 [facility] [place]  Discharge instructions  Lower Extremity Bypass Surgery  Please refer to the following instruction for your post-procedure care. Your surgeon or physician assistant will discuss any changes with you.  Activity  You are encouraged to walk as much as you can. You can slowly return to normal activities during the month after your surgery. Avoid strenuous activity and heavy lifting until your doctor tells you it's OK. Avoid activities such as vacuuming or swinging a golf club. Do not drive until your doctor give the OK and you are no longer taking prescription pain medications. It is also normal to have difficulty with sleep habits, eating and bowel movement after surgery. These will go away with time.  Bathing/Showering  You may shower after you go home. Do not soak in a bathtub, hot tub, or swim until the incision heals completely.  Incision Care  Clean your incision with mild soap and water. Shower every day. Pat the area dry with a clean towel. You do not need a bandage unless otherwise instructed. Do not apply any ointments or creams to your incision. If you have open wounds you will be instructed how to care for them or a visiting nurse may be arranged for you. If you have staples or sutures along your incision they will be removed at your post-op appointment. You may have skin glue on your incision. Do not peel it off. It will come off on its own in about one week. If you have a great deal of moisture in your groin, use a gauze help keep this area dry.  Diet  Resume your normal diet. There are no special food restrictions following this procedure. A low fat/ low cholesterol diet is recommended for all patients with vascular disease. In order to heal from your surgery, it is CRITICAL to get adequate nutrition. Your body requires vitamins, minerals, and protein. Vegetables are the best source of vitamins and minerals. Vegetables also provide the  perfect balance of protein. Processed food has little nutritional value, so try to avoid this.  Medications  Resume taking all your medications unless your doctor or nurse practitioner tells you not to. If your incision is causing pain, you may take over-the-counter pain relievers such as acetaminophen (Tylenol). If you were prescribed a stronger pain medication, please aware these medication can cause nausea and constipation. Prevent nausea by taking the medication with a snack or meal. Avoid constipation by drinking plenty of fluids and eating foods with high amount of fiber, such as fruits, vegetables, and grains. Take Colase 100 mg (an over-the-counter stool softener) twice a day as needed for constipation. Do not take Tylenol if you are taking prescription pain medications.  Follow Up  Our office will schedule a follow up appointment 2-3 weeks following discharge.  Please call us immediately for any of the following conditions  Severe or worsening pain in your legs or feet while at rest or while walking Increase pain, redness, warmth, or drainage (pus) from your incision site(s) Fever of 101 degree or higher The swelling in your leg with the bypass suddenly worsens and becomes more painful than when you were in the hospital If you have been instructed to feel your graft pulse then you should do so every day. If you can no longer feel this pulse, call the office immediately. Not all patients are given this instruction.  Leg swelling is common after leg bypass surgery.  The swelling should improve over a few months   following surgery. To improve the swelling, you may elevate your legs above the level of your heart while you are sitting or resting. Your surgeon or physician assistant may ask you to apply an ACE wrap or wear compression (TED) stockings to help to reduce swelling.  Reduce your risk of vascular disease  Stop smoking. If you would like help call [facility] at 1-800-QUIT-NOW  (1-800-784-8669) or [place] at 336-586-4000.  Manage your cholesterol Maintain a desired weight Control your diabetes weight Control your diabetes Keep your blood pressure down  If you have any questions, please call the office at [phone]   

## 2018-01-31 NOTE — Consult Note (Signed)
[facility] CM Primary Care Navigator  01/31/2018  Hilliard Borges [birth year] [id number]   Met with patient., wife Enid Derry) and daughter at the bedsideto identify possible discharge needs.   Patient reportshaving "severe pain and numbness to left lower extremity", with occluded left common fem- pop prosthetic bypass that had led to this admission/ surgery. (status post Redo left common femoral to below-knee popliteal artery bypass)  Patient endorsesDr.Gerald Hill with [facility] as his[facility].   [facility] [place] [facility] [place] [facility] [place].  Patientstatesthathehas beenmanaginghismedications at [facility] wife's assistance, straight out from the containers.  Patientverbalized that his wife has beendriving and providing transportation tohisdoctors' appointments.  Patient lives with his wifewho serves asthe primary caregiver at home and his 2 daughters can provide assistance when needed.  Anticipated discharge plan ishomewith home health services arranged pre-op per Inpatient CM note.  Patientand wife voiced understanding to call primary care provider's office for a post discharge follow-up appointment [place]- 2 weeks orsooner if needs arise. Patient letter (with PCP's contact number) wasprovided astheir reminder.  Discussed with patient and wiferegarding [facility]-CM services available for health management andresourcesat homebut both denied needing servicesat this point. Patientand wife expressedunderstandingof needto seekreferral from primary care provider to [facility] care management ifdeemed necessary and appropriatefor anyservicesin the future.  [facility] care management information was provided for futureneeds thathemay have.  However, patient hadverbally agreedand optedforEMMIcalls tofollow-up withhisrecovery at home.   Referral made for [facility] calls after  discharge.    For additional questions please contact:  Edwena Felty A. Christol Thetford, BSN, RN-BC [facility] PRIMARY CARE Navigator Cell: [phone]

## 2018-02-01 NOTE — Discharge Summary (Signed)
[facility] Discharge Summary   Patient ID:  Ryan [name] MRN: [id number] DOB/AGE: 70/08/1950 70 y.o.  Admit date: 01/28/2018 Discharge date: 01/31/2018 Date of Surgery: 01/28/2018 Surgeon: Surgeon(s): Cephus Shellinglark, Christopher J, MD  Admission Diagnosis: occluded femoral bypass  Discharge Diagnoses:  occluded femoral bypass  Secondary Diagnoses: Past Medical History:  Diagnosis Date  . Hypertension   . PVD (peripheral vascular disease) (HCC)     Procedure(s): BYPASS GRAFT FEMORAL-BELOW KNEE POPLITEAL ARTERY REDO with propaten vascular graft removable ring PATCH ANGIOPLASTY USING XENOSURE BIOLOGIC PATCH  Discharged Condition: good  HPI:  CC: Follow up peripheral artery occlusive disease   1. Ryan [name] a 67 y.o.malewhois s/pL CFA to BK pop BPG w/ Propaten, L PFA TE (12/14/16)by Dr. Catalina Antiguahenfor rest pain related to complications from Cardiology attempt to revascularize occluded L SFA stents.  Wife states right leg vascular [facility] after he fractured his right femur.   Pt wife states pt left foot started feeling worse with walking in the last 2 months. He has worsening numbness in his left foot with walking, and walking further his left calf feels numb.  He has no rest pain, and no pain in his left leg unless he walks about 400 feet.   He denies chest pain or dyspnea, denies headache.   Pt denies any known history of stroke or TIA.  He denies any known cardiac problems. Significant decline in left ABI from 100% to 41%,   Hospital Course:  Ryan MuscaLevon Maisie [name] is a 68 y.o. male is S/P  Procedure(s): BYPASS GRAFT FEMORAL-BELOW KNEE POPLITEAL ARTERY REDO with propaten vascular graft removable ring PATCH ANGIOPLASTY USING XENOSURE BIOLOGIC PATCH  Still having pain and numbness in the left foot, active motor in toes. Palpable DP left LE Left leg incisions healing well without hematoma Discharged home in stable  condition after 3 days for mobility and pain control.   Significant Diagnostic Studies: CBC Lab Results  Component Value Date   WBC 11.3 (H) 01/30/2018   HGB 9.1 (L) 01/30/2018   HCT 28.6 (L) 01/30/2018   MCV 88.5 01/30/2018   PLT 184 01/30/2018    BMET    Component Value Date/Time   NA 137 01/29/2018 0336   K 3.0 (L) 01/29/2018 0336   CL 104 01/29/2018 0336   CO2 24 01/29/2018 0336   GLUCOSE 142 (H) 01/29/2018 0336   BUN 23 01/29/2018 0336   CREATININE 1.21 01/29/2018 0336   CALCIUM 7.9 (L) 01/29/2018 0336   GFRNONAA >60 01/29/2018 0336   GFRAA >60 01/29/2018 0336   COAG Lab Results  Component Value Date   INR 0.97 01/17/2018   INR 0.9 06/16/2008   INR 1.9 (H) 12/31/2007     Disposition:  Discharge to :Home Discharge Instructions    Call MD for:  redness, tenderness, or signs of infection (pain, swelling, bleeding, redness, odor or green/yellow discharge around incision site)   Complete by:  As directed    Call MD for:  redness, tenderness, or signs of infection (pain, swelling, bleeding, redness, odor or green/yellow discharge around incision site)   Complete by:  As directed    Call MD for:  severe or increased pain, loss or decreased feeling  in affected limb(s)   Complete by:  As directed    Call MD for:  severe or increased pain, loss or decreased feeling  in affected limb(s)   Complete by:  As directed    Call MD for:  temperature >100.5  Complete by:  As directed    Call MD for:  temperature >100.5   Complete by:  As directed    Resume previous diet   Complete by:  As directed    Resume previous diet   Complete by:  As directed      Allergies as of 01/31/2018   No Known Allergies     Medication List    TAKE these medications   aspirin 81 MG tablet Take 81 mg by mouth daily.   atenolol 50 MG tablet Commonly known as:  TENORMIN Take 50 mg by mouth daily.   clopidogrel 75 MG tablet Commonly known as:  PLAVIX Take 1 tablet (75 mg total) by  mouth daily.   feeding supplement (ENSURE ENLIVE) Liqd Take 237 mLs 2 (two) times daily between meals by mouth.   hydrochlorothiazide 25 MG tablet Commonly known as:  HYDRODIURIL Take 25 mg by mouth daily.   losartan 100 MG tablet Commonly known as:  COZAAR Take 100 mg by mouth daily.   nitroGLYCERIN 0.2 mg/hr patch Commonly known as:  NITRODUR - Dosed in mg/24 hr Place 1 patch (0.2 mg total) onto the skin daily.   oxyCODONE-acetaminophen 5-325 MG tablet Commonly known as:  PERCOCET/ROXICET Take 1-2 tablets by mouth every 4 (four) hours as needed for moderate pain.   rosuvastatin 20 MG tablet Commonly known as:  CRESTOR Take 20 mg by mouth daily.      Verbal and written Discharge instructions given to the patient. Wound care per Discharge AVS Follow-up Information    [facility] Follow up.   Specialty:  Home Health Services Why:  HHPT arranged- they will call you to set up home visits Contact information: [address] [place] [address] [phone]        Cephus Shelling, MD Follow up in 2 week(s).   Specialty:  Vascular Surgery Why:  office will call Contact information: [address] [place] [address] [phone]           Signed: Mosetta Pigeon 02/01/2018, 8:28 AM

## 2018-02-12 ENCOUNTER — Ambulatory Visit (INDEPENDENT_AMBULATORY_CARE_PROVIDER_SITE_OTHER): Payer: Medicare Other | Admitting: Vascular Surgery

## 2018-02-12 ENCOUNTER — Encounter: Payer: Self-pay | Admitting: Vascular Surgery

## 2018-02-12 ENCOUNTER — Other Ambulatory Visit: Payer: Self-pay

## 2018-02-12 VITALS — BP 123/63 | HR 58 | Resp 18 | Ht 66.0 in | Wt 145.0 lb

## 2018-02-12 DIAGNOSIS — I739 Peripheral vascular disease, unspecified: Secondary | ICD-10-CM

## 2018-02-12 MED ORDER — OXYCODONE-ACETAMINOPHEN 5-325 MG PO TABS: 1.0000 | ORAL_TABLET | Freq: Four times a day (QID) | ORAL | 0 refills | Status: DC | PRN

## 2018-02-12 NOTE — Progress Notes (Signed)
Patient name: Ryan [name] MRN: [id number] DOB: 09/29/1950 Sex: male  REASON FOR VISIT: Postop check  HPI: Ryan [name] is a 68 y.o. male that presents for postop check after left common femoral to below-knee pop bypass that was a redo on 01/28/2018.  Bypass was done for rest pain.  Overall patient states that his incisions are healing without issue.  He has had no fevers or chills.  He does complain of numbness in the distal part of his left foot.  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  Past Medical History:  Diagnosis Date  . Hypertension   . PVD (peripheral vascular disease) (HCC)     Past Surgical History:  Procedure Laterality Date  . ABDOMINAL AORTAGRAM Left 12/13/2016   ABDOMINAL AORTOGRAM W/LOWER EXTREMITY/notes 12/13/2016  . ABDOMINAL AORTOGRAM W/LOWER EXTREMITY N/A 11/28/2016   Procedure: ABDOMINAL AORTOGRAM W/LOWER EXTREMITY;  Surgeon: Yates DecampGanji, Jay, MD;  Location: [facility];  Service: Cardiovascular;  Laterality: N/A;  bilateral  . ABDOMINAL AORTOGRAM W/LOWER EXTREMITY N/A 12/13/2016   Procedure: ABDOMINAL AORTOGRAM W/LOWER EXTREMITY;  Surgeon: Maeola Harmanain, Brandon Kit Brubacher, MD;  [facility];  Service: Cardiovascular;  Laterality: N/A;  unilater left leg  . ABDOMINAL AORTOGRAM W/LOWER EXTREMITY N/A 01/09/2018   Procedure: ABDOMINAL AORTOGRAM W/LOWER EXTREMITY;  Surgeon: Cephus Shellinglark, Chalene Treu J, MD;  Location: [facility];  Service: Cardiovascular;  Laterality: N/A;  . FEMORAL-POPLITEAL BYPASS GRAFT Left 12/14/2016   Procedure: BYPASS GRAFT COMMON FEMORAL- BELOW KNEE POPLITEAL ARTERY with Bovine Patch to Below the knee poplitieal artery.;  Surgeon: Fransisco Hertzhen, Brian L, MD;  [facility];  Service: Vascular;  Laterality: Left;  . FEMORAL-POPLITEAL BYPASS GRAFT Left 01/28/2018   Procedure: BYPASS GRAFT FEMORAL-BELOW KNEE POPLITEAL ARTERY REDO with propaten vascular graft removable ring;  Surgeon: Cephus Shellinglark, Jai Bear J, MD;  [facility];   Service: Vascular;  Laterality: Left;  . FEMUR SURGERY Right 2009   pt. has a rod in that leg  . LOWER EXTREMITY ANGIOGRAPHY Left 11/28/2016   Procedure: Lower Extremity Angiography;  Surgeon: Yates DecampGanji, Jay, MD;  [facility];  Service: Cardiovascular;  Laterality: Left;  lysis followup lower leg  . PATCH ANGIOPLASTY Left 01/28/2018   Procedure: PATCH ANGIOPLASTY USING Livia SnellenXENOSURE BIOLOGIC PATCH;  Surgeon: Cephus Shellinglark, Tyrihanna Wingert J, MD;  [facility];  Service: Vascular;  Laterality: Left;  . PERIPHERAL VASCULAR BALLOON ANGIOPLASTY  11/28/2016   Procedure: PERIPHERAL VASCULAR BALLOON ANGIOPLASTY;  Surgeon: Yates DecampGanji, Jay, MD;  [facility]: [facility];  Service: Cardiovascular;;  SFA  . PERIPHERAL VASCULAR CATHETERIZATION N/A 08/11/2014   Procedure: Lower Extremity Angiography;  Surgeon: Yates DecampJay Ganji, MD;  [facility];  Service: Cardiovascular;  Laterality: N/A;  . PERIPHERAL VASCULAR THROMBECTOMY  11/28/2016   Procedure: PERIPHERAL VASCULAR THROMBECTOMY;  Surgeon: Yates DecampGanji, Jay, MD;  [facility]: [facility];  Service: Cardiovascular;;  Profunda  . PERIPHERAL VASCULAR THROMBECTOMY  11/28/2016   Procedure: PERIPHERAL VASCULAR THROMBECTOMY;  Surgeon: Yates DecampGanji, Jay, MD;  [facility]: [facility];  Service: Cardiovascular;;  . THROMBECTOMY FEMORAL ARTERY Left 12/14/2016   Procedure: THROMBECTOMY PROFUNDA FEMORAL ARTERY;  Surgeon: Fransisco Hertzhen, Brian L, MD;  [facility];  Service: Vascular;  Laterality: Left;    History reviewed. No pertinent family history.  SOCIAL HISTORY: Social History   Tobacco Use  . Smoking status: Current Every Day Smoker    Packs/day: 0.10    Years: 56.00    Pack years: 5.60  Types: Cigarettes  . Smokeless tobacco: Never Used  Substance Use Topics  . Alcohol use: Yes    Alcohol/week: 10.0 standard drinks    Types: 10 Shots of liquor per week    Comment:  01/29/2018 "1-2 shots per night"    No Known Allergies  Current Outpatient  Medications  Medication Sig Dispense Refill  . aspirin 81 MG tablet Take 81 mg by mouth daily.    Marland Kitchen. atenolol (TENORMIN) 50 MG tablet Take 50 mg by mouth daily.    . clopidogrel (PLAVIX) 75 MG tablet Take 1 tablet (75 mg total) by mouth daily. 30 tablet 6  . hydrochlorothiazide (HYDRODIURIL) 25 MG tablet Take 25 mg by mouth daily.    Marland Kitchen. losartan (COZAAR) 100 MG tablet Take 100 mg by mouth daily.    Marland Kitchen. oxyCODONE-acetaminophen (PERCOCET/ROXICET) 5-325 MG tablet Take 1 tablet by mouth every 6 (six) hours as needed for moderate pain. 20 tablet 0  . rosuvastatin (CRESTOR) 20 MG tablet Take 20 mg by mouth daily.  1  . feeding supplement, ENSURE ENLIVE, (ENSURE ENLIVE) LIQD Take 237 mLs 2 (two) times daily between meals by mouth. (Patient not taking: Reported on 01/07/2018) 237 mL 12  . nitroGLYCERIN (NITRODUR - DOSED IN MG/24 HR) 0.2 mg/hr patch Place 1 patch (0.2 mg total) onto the skin daily. (Patient not taking: Reported on 01/07/2018) 30 patch 1   No current facility-administered medications for this visit.     REVIEW OF SYSTEMS:  [X]  denotes positive finding, [ ]  denotes negative finding Cardiac  Comments:  Chest pain or chest pressure:    Shortness of breath upon exertion:    Short of breath when lying flat:    Irregular heart rhythm:        Vascular    Pain in calf, thigh, or hip brought on by ambulation:    Pain in feet at night that wakes you up from your sleep:     Blood clot in your veins:    Leg swelling:         Pulmonary    Oxygen at home:    Productive cough:     Wheezing:         Neurologic    Sudden weakness in arms or legs:     Sudden numbness in arms or legs:     Sudden onset of difficulty speaking or slurred speech:    Temporary loss of vision in one eye:     Problems with dizziness:         Gastrointestinal    Blood in stool:     Vomited blood:         Genitourinary    Burning when urinating:     Blood in urine:        Psychiatric    Major depression:          Hematologic    Bleeding problems:    Problems with blood clotting too easily:        Skin    Rashes or ulcers:        Constitutional    Fever or chills:      PHYSICAL EXAM: Vitals:   02/12/18 1057  BP: 123/63  Pulse: (!) 58  Resp: 18  SpO2: 99%  Weight: 145 lb (65.8 kg)  Height: 5\' 6"  (1.676 m)    GENERAL: The patient is a well-nourished male, in no acute distress. The vital signs are documented above. CARDIAC: There is a regular rate and rhythm.  VASCULAR:  Left femoral pulse 2+ palpable Left groin incision c/d/i Left DP pulse 2+ palpable Left BK pop incision c/d/i  DATA:   None  Assessment/Plan:  Overall very happy with Ryan [name] given that this was a redo left common femoral to BK pop bypass with prosthetic.  He has a palpable dorsalis pedis pulse in the left foot and had pretty severe dependent rubor prior to surgery.  On exam today his foot looks much better and I think he has a chance of salvaging his leg.  I will have him follow-up in 1 month with arterial duplex of his left leg to ensure that his bypass remains patent and does not need any intervention in the near future.  He is fine seeing one of the nurse practitioners or PAs at that time.  All of his incisions are healing with no apparent issues at this time.   Cephus Shelling, MD [facility] [place] Office: [phone] Pager: [phone]

## 2018-02-13 ENCOUNTER — Other Ambulatory Visit: Payer: Self-pay

## 2018-02-13 DIAGNOSIS — I739 Peripheral vascular disease, unspecified: Secondary | ICD-10-CM

## 2018-02-25 DIAGNOSIS — I10 Essential (primary) hypertension: Secondary | ICD-10-CM | POA: Diagnosis not present

## 2018-02-25 DIAGNOSIS — F17209 Nicotine dependence, unspecified, with unspecified nicotine-induced disorders: Secondary | ICD-10-CM | POA: Diagnosis not present

## 2018-02-25 DIAGNOSIS — I739 Peripheral vascular disease, unspecified: Secondary | ICD-10-CM | POA: Diagnosis not present

## 2018-03-22 ENCOUNTER — Ambulatory Visit: Payer: Medicare Other | Admitting: Family

## 2018-03-22 ENCOUNTER — Encounter (HOSPITAL_COMMUNITY): Payer: Medicare Other

## 2018-04-05 ENCOUNTER — Other Ambulatory Visit: Payer: Self-pay

## 2018-04-05 ENCOUNTER — Ambulatory Visit (HOSPITAL_COMMUNITY)
Admission: RE | Admit: 2018-04-05 | Discharge: 2018-04-05 | Disposition: A | Discharge status: Home / Self Care | Payer: Medicare Other | Source: Ambulatory Visit | Attending: Vascular Surgery | Admitting: Vascular Surgery

## 2018-04-05 ENCOUNTER — Ambulatory Visit (INDEPENDENT_AMBULATORY_CARE_PROVIDER_SITE_OTHER): Payer: Medicare Other | Admitting: Physician Assistant

## 2018-04-05 ENCOUNTER — Encounter: Payer: Self-pay | Admitting: Family

## 2018-04-05 VITALS — BP 149/77 | HR 57 | Temp 98.0°F | Resp 20 | Ht 66.0 in | Wt 150.5 lb

## 2018-04-05 DIAGNOSIS — I739 Peripheral vascular disease, unspecified: Secondary | ICD-10-CM | POA: Diagnosis not present

## 2018-04-05 NOTE — Progress Notes (Signed)
    Postoperative Visit   History of Present Illness   Ryan [name] is a 68 y.o. year old male who presents for postoperative follow-up for redo L femoral to below knee popliteal bypass with PTFE by Dr. Chestine Spore 01/28/18.  The patient's wounds are  healed.  Pre-operative rest pain has resolved.  The patient is able to/ complete their activities of daily living.  He continues to have numbness along medial lower leg and into plantar foot which sometimes makes him feel like he is "walking on a balloon."  He is taking aspirin, plavix, and statin daily.   For VQI Use Only   PRE-ADM LIVING: Home  AMB STATUS: Ambulatory   Physical Examination   Vitals:   04/05/18 0855  BP: (!) 149/77  Pulse: (!) 57  Resp: 20  Temp: 98 F (36.7 C)  SpO2: 100%  Weight: 150 lb 7.4 oz (68.2 kg)  Height: 5\' 6"  (1.676 m)    LLE: Incisions are healed, palpable L DP   Medical Decision Making   Ryan [name] is a 68 y.o. year old male who presents about 2 months s/p redo L femoral to BK popliteal bypass with PTFE.  Marland Kitchen Patent bypass without areas of stenosis based on duplex; palpable L DP pulse on exam . Encouraged ambulation . Discussed with patient to allow about 6 months post op for symptoms of numbness to improve . Continue aspirin, plavix, statin . Recheck graft surveillance in 6 months per protocol   Emilie Rutter PA-C [facility] [place] Office: [phone]  Clinic MD: Dr. Randie Heinz

## 2018-04-16 DIAGNOSIS — R001 Bradycardia, unspecified: Secondary | ICD-10-CM | POA: Diagnosis not present

## 2018-04-16 DIAGNOSIS — E119 Type 2 diabetes mellitus without complications: Secondary | ICD-10-CM | POA: Diagnosis not present

## 2018-04-16 DIAGNOSIS — R1310 Dysphagia, unspecified: Secondary | ICD-10-CM | POA: Diagnosis not present

## 2018-04-16 DIAGNOSIS — R42 Dizziness and giddiness: Secondary | ICD-10-CM | POA: Diagnosis not present

## 2018-04-16 DIAGNOSIS — I7389 Other specified peripheral vascular diseases: Secondary | ICD-10-CM | POA: Diagnosis not present

## 2018-04-16 DIAGNOSIS — E1165 Type 2 diabetes mellitus with hyperglycemia: Secondary | ICD-10-CM | POA: Diagnosis not present

## 2018-04-16 DIAGNOSIS — R7309 Other abnormal glucose: Secondary | ICD-10-CM | POA: Diagnosis not present

## 2018-04-16 DIAGNOSIS — T676XXA Heat fatigue, transient, initial encounter: Secondary | ICD-10-CM | POA: Diagnosis not present

## 2018-04-16 DIAGNOSIS — I10 Essential (primary) hypertension: Secondary | ICD-10-CM | POA: Diagnosis not present

## 2018-04-16 DIAGNOSIS — E118 Type 2 diabetes mellitus with unspecified complications: Secondary | ICD-10-CM | POA: Diagnosis not present

## 2018-04-16 DIAGNOSIS — F17290 Nicotine dependence, other tobacco product, uncomplicated: Secondary | ICD-10-CM | POA: Diagnosis not present

## 2018-04-16 DIAGNOSIS — E785 Hyperlipidemia, unspecified: Secondary | ICD-10-CM | POA: Diagnosis not present

## 2018-04-16 DIAGNOSIS — I739 Peripheral vascular disease, unspecified: Secondary | ICD-10-CM | POA: Diagnosis not present

## 2018-04-16 DIAGNOSIS — E11649 Type 2 diabetes mellitus with hypoglycemia without coma: Secondary | ICD-10-CM | POA: Diagnosis not present

## 2018-05-15 IMAGING — RF DG ANG/EXT/UNI/OR LEFT
1 series · 8 of 8 positions shown · non-contrast
Comparison: None.

CLINICAL DATA: Ischemic left foot

EXAM:
[facility]MER BAUTE/EXT/UNI/ OR; DG C-ARM 61-120 MIN

[Series 1: run · 2 acquisitions, 8 frames shown]
[im 1/2]
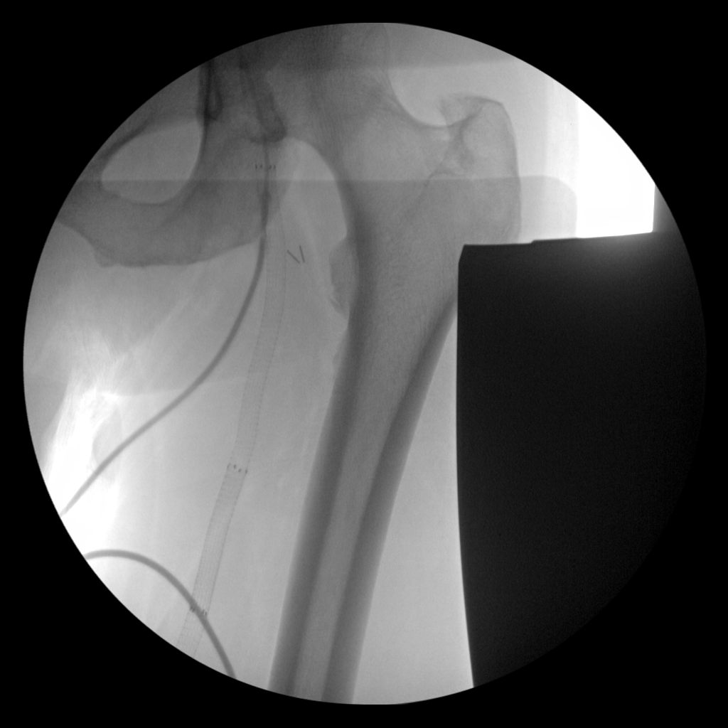
[im 1/2]
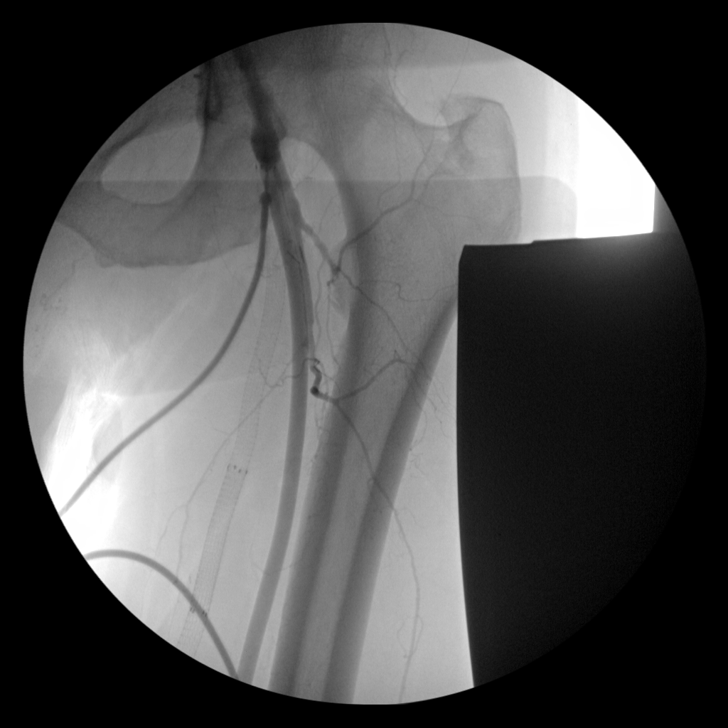
[im 1/2]
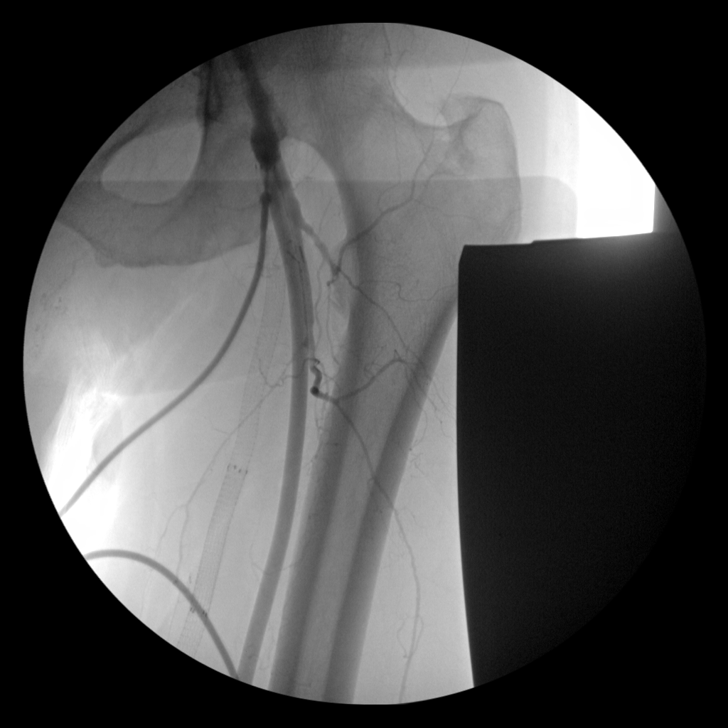
[im 1/2]
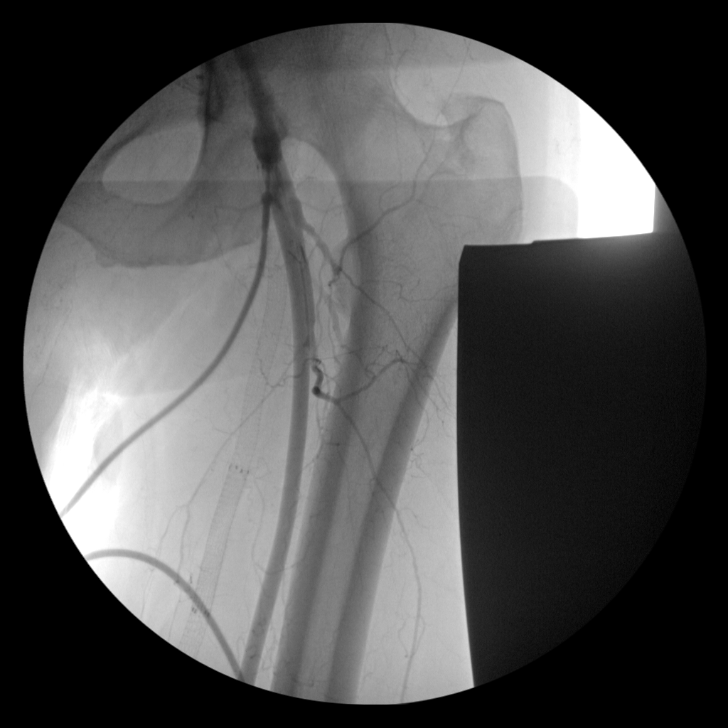
[im 2/2]
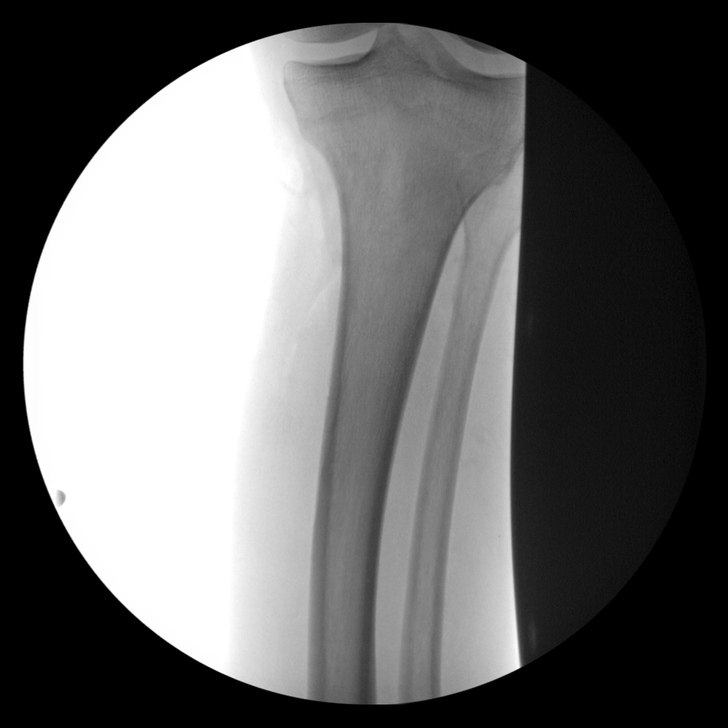
[im 2/2]
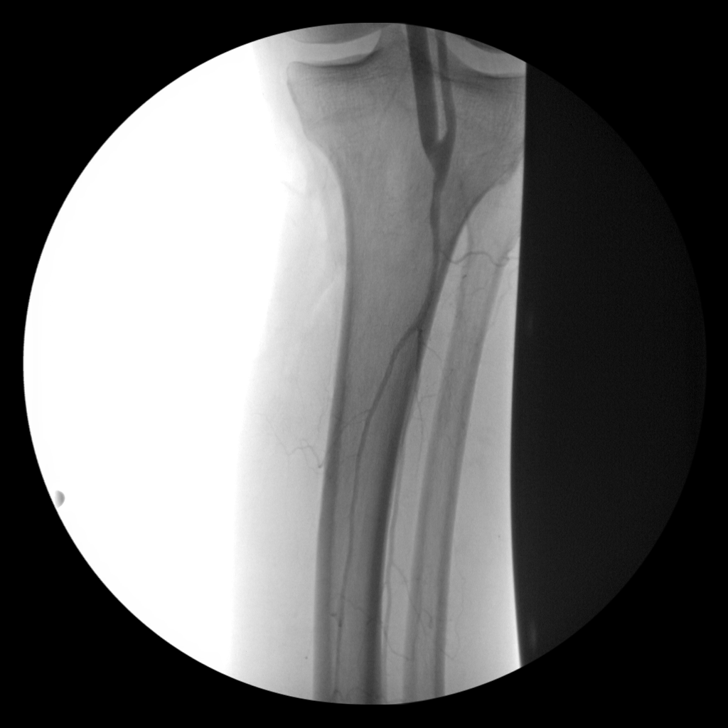
[im 2/2]
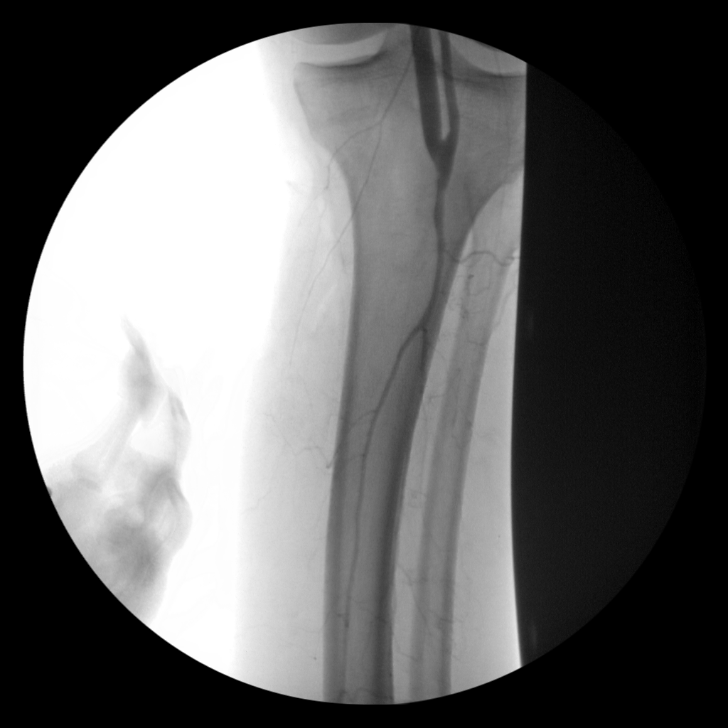
[im 2/2]
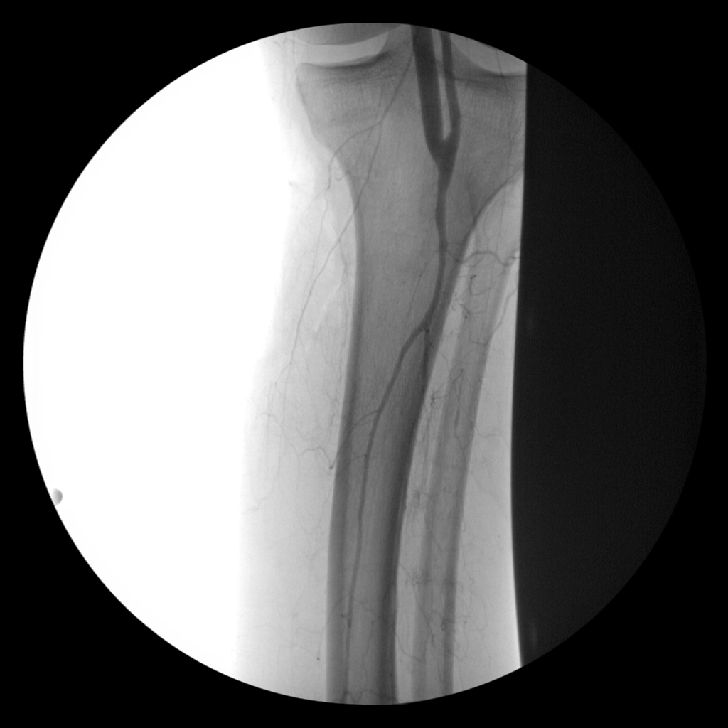

[8 of 8 positions shown; findings below may reference images not displayed]

FINDINGS: Two intraoperative angiographic images of the left lower extremity
are provided for review.

Initial image demonstrates contrast injection at the level of the
presumed left external iliac artery.

The patient has undergone left femoral- popliteal synthetic bypass
grafting. The proximal anastomosis and proximal limb of the bypass
graft appears widely patent.

The distal end of the anastomosis appears widely patent. No evidence
of contrast extravasation.

Suspected solitary runoff to the left lower leg via the left
posterior tibial artery. The peroneal artery appears to occlude
proximally. The left-sided anterior tibial artery is not definitely
identified.
IMPRESSION: Intraoperative arteriogram as above.

## 2018-09-05 ENCOUNTER — Ambulatory Visit: Payer: Self-pay | Admitting: Cardiology

## 2018-09-23 ENCOUNTER — Ambulatory Visit: Payer: Self-pay | Admitting: Cardiology

## 2018-10-08 ENCOUNTER — Other Ambulatory Visit (HOSPITAL_COMMUNITY): Payer: Medicare Other

## 2018-10-08 ENCOUNTER — Ambulatory Visit: Payer: Medicare Other | Admitting: Family

## 2018-10-08 ENCOUNTER — Other Ambulatory Visit: Payer: Self-pay

## 2018-10-08 DIAGNOSIS — I739 Peripheral vascular disease, unspecified: Secondary | ICD-10-CM

## 2018-10-11 ENCOUNTER — Other Ambulatory Visit: Payer: Self-pay

## 2018-10-11 ENCOUNTER — Ambulatory Visit (INDEPENDENT_AMBULATORY_CARE_PROVIDER_SITE_OTHER): Payer: Medicare Other | Admitting: Family

## 2018-10-11 ENCOUNTER — Encounter: Payer: Self-pay | Admitting: Family

## 2018-10-11 ENCOUNTER — Ambulatory Visit (HOSPITAL_COMMUNITY)
Admission: RE | Admit: 2018-10-11 | Discharge: 2018-10-11 | Disposition: A | Discharge status: Home / Self Care | Payer: Medicare Other | Source: Ambulatory Visit | Attending: Family | Admitting: Family

## 2018-10-11 VITALS — BP 146/72 | HR 51 | Temp 96.9°F | Resp 16 | Ht 66.0 in | Wt 145.0 lb

## 2018-10-11 DIAGNOSIS — F17200 Nicotine dependence, unspecified, uncomplicated: Secondary | ICD-10-CM | POA: Diagnosis not present

## 2018-10-11 DIAGNOSIS — I779 Disorder of arteries and arterioles, unspecified: Secondary | ICD-10-CM

## 2018-10-11 DIAGNOSIS — I739 Peripheral vascular disease, unspecified: Secondary | ICD-10-CM

## 2018-10-11 NOTE — Progress Notes (Signed)
[facility] [place]   CC: Follow up peripheral artery occlusive disease  History of Present Illness Ryan [name] is a 68 y.o. male who is s/p redo of L femoral to below knee popliteal bypass with PTFE by Dr. Chestine Sporelark on 01/28/18.  Prior to the above he was s/p L CFA to BK pop BPG w/ Propaten, L PFA TE (12/14/16) by Dr. Imogene Burnhen for rest pain related to complications from Cardiology attempt to revascularize occluded L SFA stents.  Wife states right leg vascular procedure was done at [facility] about 2015 after he fractured his right femur.   He was last evaluated on 04-05-18 by M. Eveland PA-C. At that time the bypass was patent without areas of stenosis based on duplex; palpable L DP pulse on exam Ambulation was encouraged.  Pt reported numbness along medial lower leg and into plantar foot which sometimes made him feel like he was "walking on a balloon". Matt discussed with patient to allow about 6 months post op for symptoms of numbness to improve Pt was advised to continue aspirin, plavix, statin, and to recheck graft surveillance in 6 months per protocol  He returns today for follow up.   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 He does not seem to have any claudication sx's in his left LE, he still c/o the same numbness in his left leg.   He denies chest pain or dyspnea or chest pain, denies headache.   Pt denies any known history of stroke or TIA.  He denies any known cardiac problems.    Diabetic: No Tobacco use: smoker  (currently 1 ppd, started about age 68)  Pt meds include: Statin :Yes Betablocker: yes ASA: Yes Other anticoagulants/antiplatelets: Plavix   Past Medical History:  Diagnosis Date  . Hypertension   . PVD (peripheral vascular disease) (HCC)     Social History Social History   Tobacco Use  . Smoking status: Current Every Day Smoker    Packs/day: 0.10    Years:  56.00    Pack years: 5.60    Types: Cigarettes  . Smokeless tobacco: Never Used  Substance Use Topics  . Alcohol use: Yes    Alcohol/week: 10.0 standard drinks    Types: 10 Shots of liquor per week    Comment:  01/29/2018 "1-2 shots per night"  . Drug use: No    Family History Family History  Problem Relation Age of Onset  . Hypertension Mother   . Diabetes Mother   . Hypertension Father     Past Surgical History:  Procedure Laterality Date  . ABDOMINAL AORTAGRAM Left 12/13/2016   ABDOMINAL AORTOGRAM W/LOWER EXTREMITY/notes 12/13/2016  . ABDOMINAL AORTOGRAM W/LOWER EXTREMITY N/A 11/28/2016   Procedure: ABDOMINAL AORTOGRAM W/LOWER EXTREMITY;  Surgeon: Yates DecampGanji, Jay, MD;  Location: [facility];  Service: Cardiovascular;  Laterality: N/A;  bilateral  . ABDOMINAL AORTOGRAM W/LOWER EXTREMITY N/A 12/13/2016   Procedure: ABDOMINAL AORTOGRAM W/LOWER EXTREMITY;  Surgeon: Maeola Harmanain, Brandon Christopher, MD;  [facility];  Service: Cardiovascular;  Laterality: N/A;  unilater left leg  . ABDOMINAL AORTOGRAM W/LOWER EXTREMITY N/A 01/09/2018   Procedure: ABDOMINAL AORTOGRAM W/LOWER EXTREMITY;  Surgeon: Cephus Shellinglark, Christopher J, MD;  Location: [facility];  Service: Cardiovascular;  Laterality: N/A;  . FEMORAL-POPLITEAL BYPASS GRAFT Left 12/14/2016   Procedure: BYPASS GRAFT COMMON FEMORAL- BELOW KNEE POPLITEAL ARTERY with Bovine Patch to Below the knee poplitieal artery.;  Surgeon:  Conrad Walsh, MD;  Location: [place];  Service: Vascular;  Laterality: Left;  . FEMORAL-POPLITEAL BYPASS GRAFT Left 01/28/2018   Procedure: BYPASS GRAFT FEMORAL-BELOW KNEE POPLITEAL ARTERY REDO with propaten vascular graft removable ring;  Surgeon: Marty Heck, MD;  Location: [place];  Service: Vascular;  Laterality: Left;  . FEMUR SURGERY Right 2009   pt. has a rod in that leg  . LOWER EXTREMITY ANGIOGRAPHY Left 11/28/2016   Procedure: Lower Extremity Angiography;  Surgeon: Adrian Prows, MD;   Location: [place] [facility];  Service: Cardiovascular;  Laterality: Left;  lysis followup lower leg  . PATCH ANGIOPLASTY Left 01/28/2018   Procedure: PATCH ANGIOPLASTY USING Rueben Bash BIOLOGIC PATCH;  Surgeon: Marty Heck, MD;  Location: [place];  Service: Vascular;  Laterality: Left;  . PERIPHERAL VASCULAR BALLOON ANGIOPLASTY  11/28/2016   Procedure: PERIPHERAL VASCULAR BALLOON ANGIOPLASTY;  Surgeon: Adrian Prows, MD;  Location: [facility];  Service: Cardiovascular;;  SFA  . PERIPHERAL VASCULAR CATHETERIZATION N/A 08/11/2014   Procedure: Lower Extremity Angiography;  Surgeon: Adrian Prows, MD;  Location: [place] [facility];  Service: Cardiovascular;  Laterality: N/A;  . PERIPHERAL VASCULAR THROMBECTOMY  11/28/2016   Procedure: PERIPHERAL VASCULAR THROMBECTOMY;  Surgeon: Adrian Prows, MD;  Location: [place] [facility];  Service: Cardiovascular;;  Profunda  . PERIPHERAL VASCULAR THROMBECTOMY  11/28/2016   Procedure: PERIPHERAL VASCULAR THROMBECTOMY;  Surgeon: Adrian Prows, MD;  Location: [place] [facility];  Service: Cardiovascular;;  . THROMBECTOMY FEMORAL ARTERY Left 12/14/2016   Procedure: THROMBECTOMY PROFUNDA FEMORAL ARTERY;  Surgeon: Conrad Copiah, MD;  Location: [place];  Service: Vascular;  Laterality: Left;    No Known Allergies  Current Outpatient Medications  Medication Sig Dispense Refill  . aspirin 81 MG tablet Take 81 mg by mouth daily.    Marland Kitchen atenolol (TENORMIN) 50 MG tablet Take 50 mg by mouth daily.    . clopidogrel (PLAVIX) 75 MG tablet Take 1 tablet (75 mg total) by mouth daily. 30 tablet 6  . feeding supplement, ENSURE ENLIVE, (ENSURE ENLIVE) LIQD Take 237 mLs 2 (two) times daily between meals by mouth. 237 mL 12  . hydrochlorothiazide (HYDRODIURIL) 25 MG tablet Take 25 mg by mouth daily.    Marland Kitchen losartan (COZAAR) 100 MG tablet Take 100 mg by mouth daily.    . nitroGLYCERIN (NITRODUR - DOSED IN MG/24 HR) 0.2 mg/hr patch Place 1 patch (0.2 mg total) onto the skin daily. 30  patch 1  . rosuvastatin (CRESTOR) 20 MG tablet Take 20 mg by mouth daily.  1  . oxyCODONE-acetaminophen (PERCOCET/ROXICET) 5-325 MG tablet Take 1 tablet by mouth every 6 (six) hours as needed for moderate pain. (Patient not taking: Reported on 10/11/2018) 20 tablet 0   No current facility-administered medications for this visit.     ROS: See HPI for pertinent positives and negatives.   Physical Examination  Vitals:   10/11/18 0958  BP: (!) 146/72  Pulse: (!) 51  Resp: 16  Temp: (!) 96.9 F (36.1 C)  TempSrc: Temporal  SpO2: 100%  Weight: 145 lb (65.8 kg)  Height: 5\' 6"  (1.676 m)   Body mass index is 23.4 kg/m.  General: A&O x 3, WDWN, male in NAD. Gait: normal HENT: No gross abnormalities.  Eyes: PERRLA. Bilateral arcus senilis  Pulmonary: Respirations are non labored, fair air movement in all fields, few scattered rales, no rhonchi or wheezes Cardiac: regular rhythm, no detected murmur.         Carotid Bruits  Right Left   Negative Negative   Radial pulses are 1+ palpable bilaterally   Adominal aortic pulse is not palpable                         VASCULAR EXAM: Extremities without ischemic changes, without Gangrene; without open wounds. Long thick toenails                                                                                                          LE Pulses Right Left       FEMORAL  2+ palpable  3+ palpable        POPLITEAL  not palpable   not palpable       POSTERIOR TIBIAL  not palpable   not palpable        DORSALIS PEDIS      ANTERIOR TIBIAL not palpable  1+ palpable    Abdomen: soft, NT, no palpable masses. Skin: no rashes, no cellulitis, no ulcers noted. Musculoskeletal: no muscle wasting or atrophy.  Neurologic: A&O X 3; appropriate affect, Sensation is normal; MOTOR FUNCTION:  moving all extremities equally, motor strength 5/5 throughout. Speech is fluent/normal. CN 2-12 intact. Psychiatric: Thought content is normal, mood appropriate  for clinical situation.    DATA  Left LE Arterial Duplex (10-11-18): Left Graft #1: +--------------------+--------+--------+--------+--------+                     PSV cm/sStenosisWaveformComments +--------------------+--------+--------+--------+--------+ Inflow              75              biphasic         +--------------------+--------+--------+--------+--------+ Proximal Anastomosis87              biphasic         +--------------------+--------+--------+--------+--------+ Proximal Graft      52              biphasic         +--------------------+--------+--------+--------+--------+ Mid Graft           59              biphasic         +--------------------+--------+--------+--------+--------+ Distal Graft        62              biphasic         +--------------------+--------+--------+--------+--------+ Distal Anastomosis  47              biphasic         +--------------------+--------+--------+--------+--------+ Outflow             85              biphasic         +--------------------+--------+--------+--------+--------+ Summary: Left: Normal examination. No evidence of stenosis involving the femoropopliteal bypass graft.   ABI (Date: 10/11/2018): None today or recently     ASSESSMENT: Ryan [name] is a 68 y.o. male who is s/p redo of L femoral to  below knee popliteal bypass with PTFE by Dr. Chestine Sporelark on 01/28/18.  Prior to the above he was s/p L CFA to BK pop BPG w/ Propaten, L PFA TE (12/14/16) by Dr. Imogene Burnhen for rest pain related to complications from Cardiology attempt to revascularize occluded L SFA stents.  Wife states right leg vascular procedure was done at [facility] about 2015 after he fractured his right femur.   There are no signs of ischemia in his feet or legs.  He has mild claudication in his right calf, does not seem to have claudication in his left LE.  Left LE arterial duplex today showed no evidence of stenosis  involving the femoropopliteal bypass graft, all biphasic waveforms.    PLAN:  Based on the patient's vascular studies and examination, pt will return to clinic in 3 months with bilateral LE arterial duplex and ABI's. I advised him to notify us if he develops concerns re the circulation in his feet or legs.   Walk as much as possible in a safe environment.    Long thick toenails, PAOD; [place] in [place].   Over 3 minutes was spent counseling patient re smoking cessation, and patient was given several free resources re smoking cessation.  I discussed in depth with the patient the nature of atherosclerosis, and emphasized the importance of maximal medical management including strict control of blood pressure, blood glucose, and lipid levels, obtaining regular exercise, and cessation of smoking.  The patient is aware that without maximal medical management the underlying atherosclerotic disease process will progress, limiting the benefit of any interventions.  The patient was given information about PAD including signs, symptoms, treatment, what symptoms should prompt the patient to seek immediate medical care, and risk reduction measures to take.  Charisse MarchSuzanne Tayte Mcwherter, RN, MSN, FNP-C [facility] [place] Office Phone: [phone]  Clinic MD: Randie HeinzCain  10/11/18 10:05 AM

## 2018-10-11 NOTE — Patient Instructions (Signed)

## 2019-01-07 DIAGNOSIS — I739 Peripheral vascular disease, unspecified: Secondary | ICD-10-CM | POA: Diagnosis not present

## 2019-01-07 DIAGNOSIS — B353 Tinea pedis: Secondary | ICD-10-CM | POA: Diagnosis not present

## 2019-01-07 DIAGNOSIS — I10 Essential (primary) hypertension: Secondary | ICD-10-CM | POA: Diagnosis not present

## 2019-01-07 DIAGNOSIS — Z720 Tobacco use: Secondary | ICD-10-CM | POA: Diagnosis not present

## 2019-01-10 ENCOUNTER — Other Ambulatory Visit: Payer: Self-pay

## 2019-01-10 DIAGNOSIS — I779 Disorder of arteries and arterioles, unspecified: Secondary | ICD-10-CM

## 2019-01-13 ENCOUNTER — Ambulatory Visit (INDEPENDENT_AMBULATORY_CARE_PROVIDER_SITE_OTHER): Payer: Medicare Other | Admitting: Family

## 2019-01-13 ENCOUNTER — Other Ambulatory Visit: Payer: Self-pay

## 2019-01-13 ENCOUNTER — Ambulatory Visit (HOSPITAL_COMMUNITY)
Admission: RE | Admit: 2019-01-13 | Discharge: 2019-01-13 | Disposition: A | Discharge status: Home / Self Care | Payer: Medicare Other | Source: Ambulatory Visit | Attending: Family | Admitting: Family

## 2019-01-13 ENCOUNTER — Encounter: Payer: Self-pay | Admitting: Family

## 2019-01-13 ENCOUNTER — Ambulatory Visit (INDEPENDENT_AMBULATORY_CARE_PROVIDER_SITE_OTHER)
Admission: RE | Admit: 2019-01-13 | Discharge: 2019-01-13 | Disposition: A | Discharge status: Home / Self Care | Payer: Medicare Other | Source: Ambulatory Visit | Attending: Family | Admitting: Family

## 2019-01-13 VITALS — BP 146/66 | HR 49 | Temp 97.3°F | Resp 16 | Ht 66.0 in | Wt 146.0 lb

## 2019-01-13 DIAGNOSIS — F17200 Nicotine dependence, unspecified, uncomplicated: Secondary | ICD-10-CM | POA: Diagnosis not present

## 2019-01-13 DIAGNOSIS — I779 Disorder of arteries and arterioles, unspecified: Secondary | ICD-10-CM

## 2019-01-13 DIAGNOSIS — B351 Tinea unguium: Secondary | ICD-10-CM

## 2019-01-13 DIAGNOSIS — L602 Onychogryphosis: Secondary | ICD-10-CM | POA: Diagnosis not present

## 2019-01-13 NOTE — Progress Notes (Signed)
[facility] [place]   CC: Follow up peripheral artery occlusive disease  History of Present Illness Cannon Canela is a 68 y.o. male who is s/p redo of L femoral to below knee popliteal bypass with PTFE by Dr. Chestine Spore on 01/28/18.  Prior to the above he was s/pL CFA to BK pop BPG w/ Propaten, L PFA TE (12/14/16)by Dr. Catalina Antigua rest pain related to complications from Cardiology attempt to revascularize occluded L SFA stents.  Wife states right leg vascular procedure was done at [facility] about 2015 after he fractured his right femur.   He returns today for follow up.   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 He denies claudication type symptoms in either leg, he walks a great deal at his job. He still c/o the same numbness in his left forefoot   He denies chest pain or dyspnea, denies headache.   Pt denies any known history of stroke or TIA.  He denies any known cardiac problems.   Diabetic:No Tobacco ZOX:WRUEAV (currently 1ppd, started about age 53)  Pt meds include: Statin :Yes Betablocker:yes ASA:Yes Other anticoagulants/antiplatelets:Plavix   Past Medical History:  Diagnosis Date  . Hypertension   . PVD (peripheral vascular disease) (HCC)     Social History Social History   Tobacco Use  . Smoking status: Current Every Day Smoker    Packs/day: 0.10    Years: 56.00    Pack years: 5.60    Types: Cigarettes  . Smokeless tobacco: Never Used  Substance Use Topics  . Alcohol use: Yes    Alcohol/week: 10.0 standard drinks    Types: 10 Shots of liquor per week    Comment:  01/29/2018 "1-2 shots per night"  . Drug use: No    Family History Family History  Problem Relation Age of Onset  . Hypertension Mother   . Diabetes Mother   . Hypertension Father     Past Surgical History:  Procedure Laterality Date  . ABDOMINAL AORTAGRAM Left 12/13/2016   ABDOMINAL AORTOGRAM W/LOWER EXTREMITY/notes 12/13/2016  . ABDOMINAL AORTOGRAM W/LOWER EXTREMITY N/A  11/28/2016   Procedure: ABDOMINAL AORTOGRAM W/LOWER EXTREMITY;  Surgeon: Yates Decamp, MD;  Location: [facility] INVASIVE CV [facility];  Service: Cardiovascular;  Laterality: N/A;  bilateral  . ABDOMINAL AORTOGRAM W/LOWER EXTREMITY N/A 12/13/2016   Procedure: ABDOMINAL AORTOGRAM W/LOWER EXTREMITY;  Surgeon: Maeola Harman, MD;  Location: [facility] INVASIVE CV LAB;  Service: Cardiovascular;  Laterality: N/A;  unilater left leg  . ABDOMINAL AORTOGRAM W/LOWER EXTREMITY N/A 01/09/2018   Procedure: ABDOMINAL AORTOGRAM W/LOWER EXTREMITY;  Surgeon: Cephus Shelling, MD;  Location: [facility] INVASIVE CV LAB;  Service: Cardiovascular;  Laterality: N/A;  . FEMORAL-POPLITEAL BYPASS GRAFT Left 12/14/2016   Procedure: BYPASS GRAFT COMMON FEMORAL- BELOW KNEE POPLITEAL ARTERY with Bovine Patch to Below the knee poplitieal artery.;  Surgeon: Fransisco Hertz, MD;  Location: [facility] OR;  Service: Vascular;  Laterality: Left;  . FEMORAL-POPLITEAL BYPASS GRAFT Left 01/28/2018   Procedure: BYPASS GRAFT FEMORAL-BELOW KNEE POPLITEAL ARTERY REDO with propaten vascular graft removable ring;  Surgeon: Cephus Shelling, MD;  Location: [facility] OR;  Service: Vascular;  Laterality: Left;  . FEMUR SURGERY Right 2009   pt. has a rod in that leg  . LOWER EXTREMITY ANGIOGRAPHY Left 11/28/2016   Procedure: Lower Extremity Angiography;  Surgeon: Yates Decamp, MD;  Location: [facility] INVASIVE CV LAB;  Service: Cardiovascular;  Laterality: Left;  lysis followup lower leg  . PATCH ANGIOPLASTY Left 01/28/2018   Procedure: PATCH ANGIOPLASTY USING XENOSURE BIOLOGIC PATCH;  Surgeon:  Marty Heck, MD;  Location: [place];  Service: Vascular;  Laterality: Left;  . PERIPHERAL VASCULAR BALLOON ANGIOPLASTY  11/28/2016   Procedure: PERIPHERAL VASCULAR BALLOON ANGIOPLASTY;  Surgeon: Adrian Prows, MD;  Location: [place] CV LAB;  Service: Cardiovascular;;  SFA  . PERIPHERAL VASCULAR CATHETERIZATION N/A 08/11/2014   Procedure: Lower Extremity Angiography;  Surgeon: Adrian Prows, MD;  Location: [place] CV LAB;  Service: Cardiovascular;  Laterality: N/A;  . PERIPHERAL VASCULAR THROMBECTOMY  11/28/2016   Procedure: PERIPHERAL VASCULAR THROMBECTOMY;  Surgeon: Adrian Prows, MD;  Location: [place] CV LAB;  Service: Cardiovascular;;  Profunda  . PERIPHERAL VASCULAR THROMBECTOMY  11/28/2016   Procedure: PERIPHERAL VASCULAR THROMBECTOMY;  Surgeon: Adrian Prows, MD;  Location: [place] CV LAB;  Service: Cardiovascular;;  . THROMBECTOMY FEMORAL ARTERY Left 12/14/2016   Procedure: THROMBECTOMY PROFUNDA FEMORAL ARTERY;  Surgeon: Conrad Lincolnville, MD;  Location: [place];  Service: Vascular;  Laterality: Left;    No Known Allergies  Current Outpatient Medications  Medication Sig Dispense Refill  . aspirin 81 MG tablet Take 81 mg by mouth daily.    Marland Kitchen atenolol (TENORMIN) 50 MG tablet Take 50 mg by mouth daily.    . clopidogrel (PLAVIX) 75 MG tablet Take 1 tablet (75 mg total) by mouth daily. 30 tablet 6  . feeding supplement, ENSURE ENLIVE, (ENSURE ENLIVE) LIQD Take 237 mLs 2 (two) times daily between meals by mouth. 237 mL 12  . hydrochlorothiazide (HYDRODIURIL) 25 MG tablet Take 25 mg by mouth daily.    Marland Kitchen losartan (COZAAR) 100 MG tablet Take 100 mg by mouth daily.    . nitroGLYCERIN (NITRODUR - DOSED IN MG/24 HR) 0.2 mg/hr patch Place 1 patch (0.2 mg total) onto the skin daily. 30 patch 1  . rosuvastatin (CRESTOR) 20 MG tablet Take 20 mg by mouth daily.  1  . oxyCODONE-acetaminophen (PERCOCET/ROXICET) 5-325 MG tablet Take 1 tablet by mouth every 6 (six) hours as needed for moderate pain. (Patient not taking: Reported on 01/13/2019) 20 tablet 0   No current facility-administered medications for this visit.    ROS: See HPI for pertinent positives and negatives.   Physical Examination  Vitals:   01/13/19 1014  BP: (!) 146/66  Pulse: (!) 49  Resp: 16  Temp: (!) 97.3 F (36.3 C)  TempSrc: Temporal  SpO2: 100%  Weight: 146 lb (66.2 kg)  Height: 5\' 6"  (1.676 m)    Body mass index is 23.57 kg/m.  General: A&O x 3, WDWN, male in NAD. Gait: normal HEENT: No gross abnormalities. Bilateral arcus senilis  Pulmonary: Respirations are non labored, CTAB, good air movement in all fields Cardiac: regular rhythm, bradycardic (on a beta blocker), no detected murmur.         Carotid Bruits Right Left   Negative Negative   Radial pulses are 1+ palpable bilaterally   Adominal aortic pulse is not palpable                         VASCULAR EXAM: Extremities without ischemic changes, without Gangrene; without open wounds. Thick long toenails, both feet  LE Pulses Right Left       FEMORAL  2+ palpable  2+ palpable        POPLITEAL  not palpable   not palpable       POSTERIOR TIBIAL  not palpable   not palpable        DORSALIS PEDIS      ANTERIOR TIBIAL not palpable  not palpable    Abdomen: soft, NT, no palpable masses. Skin: no rashes, no cellulitis, no ulcers noted. Musculoskeletal: no muscle wasting or atrophy.  Neurologic: A&O X 3; appropriate affect, Sensation is normal; MOTOR FUNCTION:  moving all extremities equally, motor strength 5/5 throughout. Speech is fluent/normal. CN 2-12 intact. Psychiatric: Thought content is normal, mood appropriate for clinical situation.    DATA  Left LE Arterial Duplex (01-13-19): +----------+--------+-----+--------+--------+--------+ RIGHT     PSV cm/sRatioStenosisWaveformComments +----------+--------+-----+--------+--------+--------+ CFA Distal67                   biphasic         +----------+--------+-----+--------+--------+--------+ SFA Prox  0            occluded                 +----------+--------+-----+--------+--------+--------+ +----------+--------+-----+--------+--------+--------+ LEFT      PSV  cm/sRatioStenosisWaveformComments +----------+--------+-----+--------+--------+--------+ CFA Distal82                   biphasic         +----------+--------+-----+--------+--------+--------+   Left Graft #1: Left fem-pop bpg +--------------------+--------+--------+--------+--------+                     PSV cm/sStenosisWaveformComments +--------------------+--------+--------+--------+--------+ Inflow              87              biphasic         +--------------------+--------+--------+--------+--------+ Proximal Anastomosis60              biphasic         +--------------------+--------+--------+--------+--------+ Proximal Graft      53              biphasic         +--------------------+--------+--------+--------+--------+ Mid Graft           53              biphasic         +--------------------+--------+--------+--------+--------+ Distal Graft        42              biphasic         +--------------------+--------+--------+--------+--------+ Distal Anastomosis  82              biphasic         +--------------------+--------+--------+--------+--------+ Outflow             64              biphasic         +--------------------+--------+--------+--------+--------+ Summary: Right: Total occlusion noted in the superficial femoral artery. Limited right leg scan showed occlusion of SFA at CFA bifurcation. Left: Patent left fem-pop bypass graft.    Left LE Arterial Duplex (10-11-18): Left Graft #1: +--------------------+--------+--------+--------+--------+  PSV cm/sStenosisWaveformComments +--------------------+--------+--------+--------+--------+ Inflow 75  biphasic  +--------------------+--------+--------+--------+--------+ Proximal Anastomosis87  biphasic  +--------------------+--------+--------+--------+--------+ Proximal Graft 52   biphasic  +--------------------+--------+--------+--------+--------+ Mid Graft 59  biphasic  +--------------------+--------+--------+--------+--------+ Distal Graft 62  biphasic  +--------------------+--------+--------+--------+--------+ Distal Anastomosis 47  biphasic  +--------------------+--------+--------+--------+--------+ Outflow 85  biphasic  +--------------------+--------+--------+--------+--------+  Summary: Left: Normal examination. No evidence of stenosis involving the femoropopliteal bypass graft.    ABI Findings (01-13-19): +---------+------------------+-----+----------+--------+ Right    Rt Pressure (mmHg)IndexWaveform  Comment  +---------+------------------+-----+----------+--------+ Brachial 151                                       +---------+------------------+-----+----------+--------+ ATA      88                0.58                    +---------+------------------+-----+----------+--------+ PTA      77                0.51 monophasic         +---------+------------------+-----+----------+--------+ DP                              monophasic         +---------+------------------+-----+----------+--------+ Great Toe27                0.18                    +---------+------------------+-----+----------+--------+  +---------+------------------+-----+---------+-------+ Left     Lt Pressure (mmHg)IndexWaveform Comment +---------+------------------+-----+---------+-------+ Brachial 147                                     +---------+------------------+-----+---------+-------+ ATA      158               1.05                  +---------+------------------+-----+---------+-------+ PTA      114               0.75 triphasic         +---------+------------------+-----+---------+-------+ DP                              biphasic         +---------+------------------+-----+---------+-------+ Great Toe76                0.50                  +---------+------------------+-----+---------+-------+  +-------+-----------+-----------+------------+------------+ ABI/TBIToday's ABIToday's TBIPrevious ABIPrevious TBI +-------+-----------+-----------+------------+------------+ Right  0.58       0.18       0.63        0.44         +-------+-----------+-----------+------------+------------+ Left   1.05       0.50       0.41        0            +-------+-----------+-----------+------------+------------+  Right ABIs appear essentially unchanged compared to prior study on 01/03/2018. Left ABIs appear increased compared to prior study on 01/03/2018.   Summary: Right: Resting right ankle-brachial index indicates moderate right lower extremity arterial disease. The right toe-brachial index is abnormal. RT great toe pressure = 27 mmHg.  Left: Resting left ankle-brachial index is within normal range. No evidence of significant left lower extremity arterial disease. The left toe-brachial index is abnormal. LT Great toe pressure = 76 mmHg.    ABI Findings (01-03-18): +---------+------------------+-----+----------+--------+ Right    Rt Pressure (  mmHg)IndexWaveform  Comment  +---------+------------------+-----+----------+--------+ Brachial 201                                       +---------+------------------+-----+----------+--------+ PTA      126               0.63 monophasic         +---------+------------------+-----+----------+--------+ DP       125               0.62 monophasic         +---------+------------------+-----+----------+--------+ Great Toe88                0.44 Abnormal            +---------+------------------+-----+----------+--------+  +---------+------------------+-----+----------+------------+ Left     Lt Pressure (mmHg)IndexWaveform  Comment      +---------+------------------+-----+----------+------------+ Brachial 199                                           +---------+------------------+-----+----------+------------+ PTA      82                0.41 monophasic             +---------+------------------+-----+----------+------------+ PERO     82                0.41 monophasic             +---------+------------------+-----+----------+------------+ Great Toe                                 not obtained +---------+------------------+-----+----------+------------+  +-------+-----------+-----------+------------+------------+ ABI/TBIToday's ABIToday's TBIPrevious ABIPrevious TBI +-------+-----------+-----------+------------+------------+ Right  0.63       0.44       0.50        0.39         +-------+-----------+-----------+------------+------------+ Left   0.41                  1.11        0.86         +-------+-----------+-----------+------------+------------+  Summary: Right: Resting right ankle-brachial index indicates moderate right lower extremity arterial disease. The right toe-brachial index is abnormal.  Left: Resting left ankle-brachial index indicates severe left lower extremity arterial disease. Unable to obtain toe-brachial index due to low amplitude of waveform.   ASSESSMENT: Areli Jowett is a 68 y.o. male who is s/p redo of L femoral to below knee popliteal bypass with PTFE by Dr. Chestine Spore on 01/28/18.  Prior to the above he was s/pL CFA to BK pop BPG w/ Propaten, L PFA TE (12/14/16)by Dr. Catalina Antigua rest pain related to complications from Cardiology attempt to revascularize occluded L SFA stents.  At a previous visit wife stated that right leg vascular procedure was done at [facility] about 2015  after he fractured his right femur.   There are no signs of ischemia in his feet or legs.  He has no claudication in either leg with walking. Left LE arterial duplex today shows no evidence of stenosis involving the femoropopliteal bypass graft, all biphasic waveforms. Right SFA with known occlusion. ABI's today indicate normal perfusion in left LE with tri and biphasic waveforms, marked improvement compared to 01-03-18 at 41% perfusion.  Right ABI stable  at 0.58 compared to 0.63 on 01-03-18, with monophasic waveforms. He apparently has adequate collateral perfusion distal to the right SFA occlusion.   He is taking a beta blocker, but is bradycardic, denies feeling light headed, denies dyspnea. I advised pt to discuss with his PCP whether a different medication other than a beta blocker would be appropriate for him.    PLAN:  Based on the patient's vascular studies and examination, pt will return to clinic in 6 months with left LE arterial duplex and ABI's. I advised him to notify [facility] if he develops concerns re the circulation in his feet or legs.   Long thick toenails, PAOD; will refer to podiatrist, he requests podiatrist on [address] in [place], [facility].  Over 3 minutes was spent counseling patient re smoking cessation, and patient was given several free resources re smoking cessation.  Continue extensive walking.   I discussed in depth with the patient the nature of atherosclerosis, and emphasized the importance of maximal medical management including strict control of blood pressure, blood glucose, and lipid levels, obtaining regular exercise, and cessation of smoking.  The patient is aware that without maximal medical management the underlying atherosclerotic disease process will progress, limiting the benefit of any interventions.  The patient was given information about PAD including signs, symptoms, treatment, what symptoms should prompt the patient to seek  immediate medical care, and risk reduction measures to take.  Charisse MarchSuzanne Alanah Sakuma, RN, MSN, FNP-C [facility] [place] Office Phone: [phone]  Clinic MD: Myra GianottiBrabham  01/13/19 10:30 AM

## 2019-01-13 NOTE — Patient Instructions (Signed)

## 2019-01-30 ENCOUNTER — Other Ambulatory Visit: Payer: Self-pay | Admitting: *Deleted

## 2019-01-30 DIAGNOSIS — I739 Peripheral vascular disease, unspecified: Secondary | ICD-10-CM

## 2019-04-23 DIAGNOSIS — Z23 Encounter for immunization: Secondary | ICD-10-CM | POA: Diagnosis not present

## 2019-08-11 ENCOUNTER — Telehealth (HOSPITAL_COMMUNITY): Payer: Self-pay | Admitting: *Deleted

## 2019-08-12 ENCOUNTER — Ambulatory Visit (HOSPITAL_COMMUNITY)
Admission: RE | Admit: 2019-08-12 | Discharge: 2019-08-12 | Disposition: A | Discharge status: Home / Self Care | Payer: Medicare Other | Source: Ambulatory Visit | Attending: Vascular Surgery | Admitting: Vascular Surgery

## 2019-08-12 ENCOUNTER — Other Ambulatory Visit: Payer: Self-pay

## 2019-08-12 ENCOUNTER — Ambulatory Visit (INDEPENDENT_AMBULATORY_CARE_PROVIDER_SITE_OTHER)
Admission: RE | Admit: 2019-08-12 | Discharge: 2019-08-12 | Disposition: A | Discharge status: Home / Self Care | Payer: Medicare Other | Source: Ambulatory Visit | Attending: Vascular Surgery | Admitting: Vascular Surgery

## 2019-08-12 ENCOUNTER — Ambulatory Visit (INDEPENDENT_AMBULATORY_CARE_PROVIDER_SITE_OTHER): Payer: Medicare Other | Admitting: Physician Assistant

## 2019-08-12 VITALS — BP 149/78 | HR 53 | Temp 96.9°F | Resp 20 | Ht 66.0 in | Wt 144.6 lb

## 2019-08-12 DIAGNOSIS — F17200 Nicotine dependence, unspecified, uncomplicated: Secondary | ICD-10-CM

## 2019-08-12 DIAGNOSIS — I739 Peripheral vascular disease, unspecified: Secondary | ICD-10-CM

## 2019-08-12 NOTE — Progress Notes (Signed)
Office Note     CC:  follow up Requesting Provider:  Mirna Mires, MD  HPI: Ryan [name] is a 69 y.o. (1950/03/12) male who presents for routine follow-up peripheral vascular disease.  Vascular surgery history significant forredoofL femoral to below knee popliteal bypass with PTFE by Dr. Chestine Spore on12/30/19.  Prior to the above he wass/pL CFA to BK pop BPG w/ Propaten, L PFA TE (12/14/16)by Dr. Catalina Antigua rest pain related to complications from Cardiology attempt to revascularize occluded L SFA stents.  Primary complaint today is bilateral plantar foot numbness left greater than right. This has been present since his bypass, but he says it is worsening over the last several months. He denies claudication or rest pain.  He states he saw a podiatrist after his last visit here in December.  He was instructed to soak his feet to eliminate dry skin of his plantar surface of both feet.  He states he has failed to do this recently.  His wife accompanies him to his office visit today.  Is compliant with aspirin, statin and clopidogrel.  Continues to smoke several cigarettes daily.  He has been smoking for approximately 56 years.  History of hypertension.  No history of coronary artery disease or diabetes mellitus. Past Medical History:  Diagnosis Date  . Hypertension   . PVD (peripheral vascular disease) (HCC)     Past Surgical History:  Procedure Laterality Date  . ABDOMINAL AORTAGRAM Left 12/13/2016   ABDOMINAL AORTOGRAM W/LOWER EXTREMITY/notes 12/13/2016  . ABDOMINAL AORTOGRAM W/LOWER EXTREMITY N/A 11/28/2016   Procedure: ABDOMINAL AORTOGRAM W/LOWER EXTREMITY;  Surgeon: Yates Decamp, MD;  Location: [facility];  Service: Cardiovascular;  Laterality: N/A;  bilateral  . ABDOMINAL AORTOGRAM W/LOWER EXTREMITY N/A 12/13/2016   Procedure: ABDOMINAL AORTOGRAM W/LOWER EXTREMITY;  Surgeon: Maeola Harman, MD;  Location: [facility] INVASIVE CV LAB;  Service: Cardiovascular;  Laterality: N/A;   unilater left leg  . ABDOMINAL AORTOGRAM W/LOWER EXTREMITY N/A 01/09/2018   Procedure: ABDOMINAL AORTOGRAM W/LOWER EXTREMITY;  Surgeon: Cephus Shelling, MD;  Location: [facility];  Service: Cardiovascular;  Laterality: N/A;  . FEMORAL-POPLITEAL BYPASS GRAFT Left 12/14/2016   Procedure: BYPASS GRAFT COMMON FEMORAL- BELOW KNEE POPLITEAL ARTERY with Bovine Patch to Below the knee poplitieal artery.;  Surgeon: Fransisco Hertz, MD;  Location: [facility] OR;  Service: Vascular;  Laterality: Left;  . FEMORAL-POPLITEAL BYPASS GRAFT Left 01/28/2018   Procedure: BYPASS GRAFT FEMORAL-BELOW KNEE POPLITEAL ARTERY REDO with propaten vascular graft removable ring;  Surgeon: Cephus Shelling, MD;  Location: [facility] OR;  Service: Vascular;  Laterality: Left;  . FEMUR SURGERY Right 2009   pt. has a rod in that leg  . LOWER EXTREMITY ANGIOGRAPHY Left 11/28/2016   Procedure: Lower Extremity Angiography;  Surgeon: Yates Decamp, MD;  Location: [facility] INVASIVE CV LAB;  Service: Cardiovascular;  Laterality: Left;  lysis followup lower leg  . PATCH ANGIOPLASTY Left 01/28/2018   Procedure: PATCH ANGIOPLASTY USING Livia Snellen BIOLOGIC PATCH;  Surgeon: Cephus Shelling, MD;  Location: [facility] OR;  Service: Vascular;  Laterality: Left;  . PERIPHERAL VASCULAR BALLOON ANGIOPLASTY  11/28/2016   Procedure: PERIPHERAL VASCULAR BALLOON ANGIOPLASTY;  Surgeon: Yates Decamp, MD;  Location: [facility];  Service: Cardiovascular;;  SFA  . PERIPHERAL VASCULAR CATHETERIZATION N/A 08/11/2014   Procedure: Lower Extremity Angiography;  Surgeon: Yates Decamp, MD;  Location: [facility] INVASIVE CV LAB;  Service: Cardiovascular;  Laterality: N/A;  . PERIPHERAL VASCULAR THROMBECTOMY  11/28/2016   Procedure: PERIPHERAL VASCULAR THROMBECTOMY;  Surgeon: Jacinto Halim,  Vonna Kotyk, MD;  Location: [facility];  Service: Cardiovascular;;  Profunda  . PERIPHERAL VASCULAR THROMBECTOMY  11/28/2016   Procedure: PERIPHERAL VASCULAR THROMBECTOMY;  Surgeon: Yates Decamp, MD;   Location: [facility];  Service: Cardiovascular;;  . THROMBECTOMY FEMORAL ARTERY Left 12/14/2016   Procedure: THROMBECTOMY PROFUNDA FEMORAL ARTERY;  Surgeon: Fransisco Hertz, MD;  Location: [facility] OR;  Service: Vascular;  Laterality: Left;    Social History   Socioeconomic History  . Marital status: Married    Spouse name: Not on file  . Number of children: Not on file  . Years of education: Not on file  . Highest education level: Not on file  Occupational History  . Not on file  Tobacco Use  . Smoking status: Current Every Day Smoker    Packs/day: 0.10    Years: 56.00    Pack years: 5.60    Types: Cigarettes  . Smokeless tobacco: Never Used  Vaping Use  . Vaping Use: Never used  Substance and Sexual Activity  . Alcohol use: Yes    Alcohol/week: 10.0 standard drinks    Types: 10 Shots of liquor per week    Comment:  01/29/2018 "1-2 shots per night"  . Drug use: No  . Sexual activity: Not on file  Other Topics Concern  . Not on file  Social History Narrative  . Not on file   Social Determinants of Health   Financial Resource Strain:   . Difficulty of Paying Living Expenses:   Food Insecurity:   . Worried About Programme researcher, broadcasting/film/video in the Last Year:   . Barista in the Last Year:   Transportation Needs:   . Freight forwarder (Medical):   Marland Kitchen Lack of Transportation (Non-Medical):   Physical Activity:   . Days of Exercise per Week:   . Minutes of Exercise per Session:   Stress:   . Feeling of Stress :   Social Connections:   . Frequency of Communication with Friends and Family:   . Frequency of Social Gatherings with Friends and Family:   . Attends Religious Services:   . Active Member of Clubs or Organizations:   . Attends Banker Meetings:   Marland Kitchen Marital Status:   Intimate Partner Violence:   . Fear of Current or Ex-Partner:   . Emotionally Abused:   Marland Kitchen Physically Abused:   . Sexually Abused:    Family History  Problem Relation Age of  Onset  . Hypertension Mother   . Diabetes Mother   . Hypertension Father     Current Outpatient Medications  Medication Sig Dispense Refill  . aspirin 81 MG tablet Take 81 mg by mouth daily.    Marland Kitchen atenolol (TENORMIN) 50 MG tablet Take 50 mg by mouth daily.    . clopidogrel (PLAVIX) 75 MG tablet Take 1 tablet (75 mg total) by mouth daily. 30 tablet 6  . feeding supplement, ENSURE ENLIVE, (ENSURE ENLIVE) LIQD Take 237 mLs 2 (two) times daily between meals by mouth. 237 mL 12  . hydrochlorothiazide (HYDRODIURIL) 25 MG tablet Take 25 mg by mouth daily.    Marland Kitchen losartan (COZAAR) 100 MG tablet Take 100 mg by mouth daily.    Marland Kitchen losartan-hydrochlorothiazide (HYZAAR) 100-25 MG tablet     . nitroGLYCERIN (NITRODUR - DOSED IN MG/24 HR) 0.2 mg/hr patch Place 1 patch (0.2 mg total) onto the skin daily. 30 patch 1  . rosuvastatin (CRESTOR) 20 MG tablet Take 20 mg by  mouth daily.  1   No current facility-administered medications for this visit.    No Known Allergies   REVIEW OF SYSTEMS:   [X]  denotes positive finding, [ ]  denotes negative finding Cardiac  Comments:  Chest pain or chest pressure:    Shortness of breath upon exertion:    Short of breath when lying flat:    Irregular heart rhythm:        Vascular    Pain in calf, thigh, or hip brought on by ambulation:    Pain in feet at night that wakes you up from your sleep:     Blood clot in your veins:    Leg swelling:         Pulmonary    Oxygen at home:    Productive cough:     Wheezing:         Neurologic    Sudden weakness in arms or legs:     Sudden numbness in arms or legs:     Sudden onset of difficulty speaking or slurred speech:    Temporary loss of vision in one eye:     Problems with dizziness:         Gastrointestinal    Blood in stool:     Vomited blood:         Genitourinary    Burning when urinating:     Blood in urine:        Psychiatric    Major depression:         Hematologic    Bleeding problems:      Problems with blood clotting too easily:        Skin    Rashes or ulcers:        Constitutional    Fever or chills:      PHYSICAL EXAMINATION:  Vitals:   08/12/19 1122  BP: (!) 149/78  Pulse: (!) 53  Resp: 20  Temp: (!) 96.9 F (36.1 C)  SpO2: 98%   General:  WDWN in NAD; vital signs documented above Gait: Normal; unaided HENT: WNL, normocephalic Pulmonary: normal non-labored breathing , without Rales, rhonchi,  wheezing Cardiac: regular HR, without  Murmurs without carotid bruit Abdomen: soft, NT, no masses Skin: without rashes Vascular Exam/Pulses: 2+ left femoral pulse.  I cannot definitely palpate his right femoral pulse.  2+ radial pulses bilaterally. Extremities: without ischemic changes, without Gangrene , without cellulitis; without open wounds; Both feet warm and perfused.  No tissue breakdown.  He has dry, heaped up layers of skin along the metatarsal heads of the plantar surface of both feet, left greater than right.  There is no fissuring. Musculoskeletal: no muscle wasting or atrophy  Neurologic: A&O X 3;  No focal weakness or paresthesias are detected Psychiatric:  The pt has Normal affect.   Non-Invasive Vascular Imaging:   LLE duplex 08/12/2019 Summary:  Left: 50 - 70% stenosis in the proximal segment of the femoral to below  knee popliteal bypass graft. PSV = 196 with biphasic waveforms throughout graft.    ABI/TBIToday's ABIToday's TBIPrevious ABIPrevious TBI  +-------+-----------+-----------+------------+------------+  Right 0.58    0.55    0.58    0.18      +-------+-----------+-----------+------------+------------+  Left  1.08    0.55    1.05    0.50      +-------+-----------+-----------+------------+------------+ Left TP = 111  Right TP = 72  ASSESSMENT/PLAN:: 69 y.o. male here for follow up for peripheral arterial disease status post left redo  femoral to popliteal bypass with PTFE graft.  He is  not experiencing claudication or rest pain.  On today's duplex, there is noted a 50 to 70% stenosis at the proximal segment of his graft near the anastomosis.  This does not appear to be flow-limiting.  He has a normal ABI on the left with biphasic waveforms.  Will recommend short-term follow-up (3 months).   Encouraged him to continue skin care of his feet as recommended by podiatry.  Continue aspirin, Plavix and statin as previously prescribed.  Milinda AntisSandra J Shaqueta Casady, PA-C [facility] [phone]  Clinic MD:  Chestine Sporelark

## 2019-08-13 ENCOUNTER — Other Ambulatory Visit: Payer: Self-pay | Admitting: *Deleted

## 2019-08-13 DIAGNOSIS — I739 Peripheral vascular disease, unspecified: Secondary | ICD-10-CM

## 2019-08-13 DIAGNOSIS — I779 Disorder of arteries and arterioles, unspecified: Secondary | ICD-10-CM

## 2019-08-27 ENCOUNTER — Telehealth: Payer: Self-pay

## 2019-08-27 NOTE — Telephone Encounter (Signed)
Pt called with c/o L foot pain after he was putting up cabinets 2 weeks ago. It came on suddenly when he was "stretching" while putting up cabinets. He thinks it may be a muscle strain but is unsure. He is going to call his PCP and will let us know if he wants to make an appt in the office to be seen here.

## 2019-09-19 ENCOUNTER — Telehealth: Payer: Self-pay | Admitting: *Deleted

## 2019-09-19 ENCOUNTER — Ambulatory Visit (INDEPENDENT_AMBULATORY_CARE_PROVIDER_SITE_OTHER)
Admission: RE | Admit: 2019-09-19 | Discharge: 2019-09-19 | Disposition: A | Discharge status: Home / Self Care | Payer: Medicare Other | Source: Ambulatory Visit | Attending: Physician Assistant | Admitting: Physician Assistant

## 2019-09-19 ENCOUNTER — Other Ambulatory Visit (HOSPITAL_COMMUNITY)
Admission: RE | Admit: 2019-09-19 | Discharge: 2019-09-19 | Disposition: A | Discharge status: Home / Self Care | Payer: Medicare Other | Source: Ambulatory Visit | Attending: Vascular Surgery | Admitting: Vascular Surgery

## 2019-09-19 ENCOUNTER — Encounter: Payer: Self-pay | Admitting: *Deleted

## 2019-09-19 ENCOUNTER — Ambulatory Visit (INDEPENDENT_AMBULATORY_CARE_PROVIDER_SITE_OTHER): Payer: Medicare Other | Admitting: Physician Assistant

## 2019-09-19 ENCOUNTER — Other Ambulatory Visit: Payer: Self-pay

## 2019-09-19 ENCOUNTER — Other Ambulatory Visit: Payer: Self-pay | Admitting: *Deleted

## 2019-09-19 VITALS — BP 152/69 | HR 51 | Temp 97.6°F | Resp 16 | Ht 66.0 in | Wt 145.0 lb

## 2019-09-19 DIAGNOSIS — Z20822 Contact with and (suspected) exposure to covid-19: Secondary | ICD-10-CM | POA: Insufficient documentation

## 2019-09-19 DIAGNOSIS — I739 Peripheral vascular disease, unspecified: Secondary | ICD-10-CM | POA: Insufficient documentation

## 2019-09-19 DIAGNOSIS — F17200 Nicotine dependence, unspecified, uncomplicated: Secondary | ICD-10-CM | POA: Insufficient documentation

## 2019-09-19 DIAGNOSIS — I10 Essential (primary) hypertension: Secondary | ICD-10-CM | POA: Insufficient documentation

## 2019-09-19 DIAGNOSIS — I779 Disorder of arteries and arterioles, unspecified: Secondary | ICD-10-CM | POA: Insufficient documentation

## 2019-09-19 DIAGNOSIS — T82898A Other specified complication of vascular prosthetic devices, implants and grafts, initial encounter: Secondary | ICD-10-CM | POA: Diagnosis not present

## 2019-09-19 DIAGNOSIS — Z95820 Peripheral vascular angioplasty status with implants and grafts: Secondary | ICD-10-CM | POA: Insufficient documentation

## 2019-09-19 DIAGNOSIS — M79672 Pain in left foot: Secondary | ICD-10-CM | POA: Insufficient documentation

## 2019-09-19 LAB — SARS CORONAVIRUS 2 (TAT 6-24 HRS): SARS Coronavirus 2: NEGATIVE

## 2019-09-19 MED ORDER — OXYCODONE-ACETAMINOPHEN 5-325 MG PO TABS: 1.0000 | ORAL_TABLET | ORAL | 0 refills | Status: DC | PRN

## 2019-09-19 NOTE — Telephone Encounter (Signed)
Left message for pt to call back. Procedure time on 8/23 has changed. He needs to arrive at [facility] at 11:30am.

## 2019-09-19 NOTE — H&P (View-Only) (Signed)
Office Note     CC:  follow up Requesting Provider:  Mirna Mires, MD  HPI: Ryan [name] is a 69 y.o. (1950-05-26) male who presents for follow up of peripheral arterial disease. He has history of left common femoral artery to below knee popliteal bypass graft with PTFE and left profundofemoral endarterectomy on 12/14/16 by Dr. Imogene Burn. He had to subsequently have a redo left femoral to below knee popliteal bypass with PTFE by Dr. Chestine Spore on 01/28/18.  He was last seen on 08/12/19 for follow up. At the time he was complaining of bilateral plantar foot numbness, left greater than right which seemed to be worsening. He otherwise did not have rest pain or claudication symptoms. His ABI/TBI's were stable. He did have 50-70% stenosis noted on duplex evaluation in the proximal segment of the femoral to below knee popliteal bypass graft. He was scheduled for 3 month follow up.  He presents for earlier follow up today due to acute onset of left lower extremity pain that started on 08/27/19. He and his wife said that they tried to follow up on the day it occurred and also prior to today's visit with some difficulty. The patient explains that he was hanging some cabinets and was up and down a ladder when all the sudden he had sharp pain shoot down his leg and into his left foot. He says that since then the forefoot constantly hurts and is worsened by ambulation. This pain will also wake him up at night intermittently. He additionally has sharp pains that shoot from knee down into foot that occur on ambulation with some improvement with rest. He also feels that he has increased numbness in the left forefoot and it is very tender to touch and it is cooler. He has had numbness in the left toes since his original bypass in 2018 so that has not changed. He is struggling to bear weight on the foot. He has been taking tylenol for pain but it is not helping. He denies any pain in right leg or claudication symptoms. He says he  has been compliant with his Aspirin, Statin and Plavix. He does still smoke a couple cigarettes a day but he is working on quitting  The pt is on a statin for cholesterol management.  The pt is on a daily aspirin.   Other AC: Plavix The pt is on BB, HCTZ, ARB for hypertension.   The pt is not diabetic.  Tobacco hx: current smoker, several cigarettes per day  Past Medical History:  Diagnosis Date  . Hypertension   . PVD (peripheral vascular disease) (HCC)     Past Surgical History:  Procedure Laterality Date  . ABDOMINAL AORTAGRAM Left 12/13/2016   ABDOMINAL AORTOGRAM W/LOWER EXTREMITY/notes 12/13/2016  . ABDOMINAL AORTOGRAM W/LOWER EXTREMITY N/A 11/28/2016   Procedure: ABDOMINAL AORTOGRAM W/LOWER EXTREMITY;  Surgeon: Yates Decamp, MD;  Location: [facility] INVAS[facility] LAB;  Service: Cardiovascular;  Laterality: N/A;  bilateral  . ABDOMINAL AORTOGRAM W/LOWER EXTREMITY N/A 12/13/2016   Procedure: ABDOMINAL AORTOGRAM W/LOWER EXTREMITY;  Surgeon: Maeola Harman, MD;  Location: [facility] INVASIVE CV LAB;  Service: Cardiovascular;  Laterality: N/A;  unilater left leg  . ABDOMINAL AORTOGRAM W/LOWER EXTREMITY N/A 01/09/2018   Procedure: ABDOMINAL AORTOGRAM W/LOWER EXTREMITY;  Surgeon: Cephus Shelling, MD;  Location: [facility];  Service: Cardiovascular;  Laterality: N/A;  . FEMORAL-POPLITEAL BYPASS GRAFT Left 12/14/2016   Procedure: BYPASS GRAFT COMMON FEMORAL- BELOW KNEE POPLITEAL ARTERY with Bovine Patch to Below the knee poplitieal  artery.;  Surgeon: Fransisco Hertz, MD;  Location: [facility] OR;  Service: Vascular;  Laterality: Left;  . FEMORAL-POPLITEAL BYPASS GRAFT Left 01/28/2018   Procedure: BYPASS GRAFT FEMORAL-BELOW KNEE POPLITEAL ARTERY REDO with propaten vascular graft removable ring;  Surgeon: Cephus Shelling, MD;  Location: [facility] OR;  Service: Vascular;  Laterality: Left;  . FEMUR SURGERY Right 2009   pt. has a rod in that leg  . LOWER EXTREMITY ANGIOGRAPHY Left 11/28/2016    Procedure: Lower Extremity Angiography;  Surgeon: Yates Decamp, MD;  Location: [facility] INVASIVE CV LAB;  Service: Cardiovascular;  Laterality: Left;  lysis followup lower leg  . PATCH ANGIOPLASTY Left 01/28/2018   Procedure: PATCH ANGIOPLASTY USING Livia Snellen BIOLOGIC PATCH;  Surgeon: Cephus Shelling, MD;  Location: [facility] OR;  Service: Vascular;  Laterality: Left;  . PERIPHERAL VASCULAR BALLOON ANGIOPLASTY  11/28/2016   Procedure: PERIPHERAL VASCULAR BALLOON ANGIOPLASTY;  Surgeon: Yates Decamp, MD;  Location: [facility];  Service: Cardiovascular;;  SFA  . PERIPHERAL VASCULAR CATHETERIZATION N/A 08/11/2014   Procedure: Lower Extremity Angiography;  Surgeon: Yates Decamp, MD;  Location: [facility] INVASIVE CV LAB;  Service: Cardiovascular;  Laterality: N/A;  . PERIPHERAL VASCULAR THROMBECTOMY  11/28/2016   Procedure: PERIPHERAL VASCULAR THROMBECTOMY;  Surgeon: Yates Decamp, MD;  Location: [facility];  Service: Cardiovascular;;  Profunda  . PERIPHERAL VASCULAR THROMBECTOMY  11/28/2016   Procedure: PERIPHERAL VASCULAR THROMBECTOMY;  Surgeon: Yates Decamp, MD;  Location: [facility];  Service: Cardiovascular;;  . THROMBECTOMY FEMORAL ARTERY Left 12/14/2016   Procedure: THROMBECTOMY PROFUNDA FEMORAL ARTERY;  Surgeon: Fransisco Hertz, MD;  Location: [facility] OR;  Service: Vascular;  Laterality: Left;    Social History   Socioeconomic History  . Marital status: Married    Spouse name: Not on file  . Number of children: Not on file  . Years of education: Not on file  . Highest education level: Not on file  Occupational History  . Not on file  Tobacco Use  . Smoking status: Current Every Day Smoker    Packs/day: 0.10    Years: 56.00    Pack years: 5.60    Types: Cigarettes  . Smokeless tobacco: Never Used  Vaping Use  . Vaping Use: Never used  Substance and Sexual Activity  . Alcohol use: Yes    Alcohol/week: 10.0 standard drinks    Types: 10 Shots of liquor per week    Comment:  01/29/2018 "1-2  shots per night"  . Drug use: No  . Sexual activity: Not on file  Other Topics Concern  . Not on file  Social History Narrative  . Not on file   Social Determinants of Health   Financial Resource Strain:   . Difficulty of Paying Living Expenses: Not on file  Food Insecurity:   . Worried About Programme researcher, broadcasting/film/video in the Last Year: Not on file  . Ran Out of Food in the Last Year: Not on file  Transportation Needs:   . Lack of Transportation (Medical): Not on file  . Lack of Transportation (Non-Medical): Not on file  Physical Activity:   . Days of Exercise per Week: Not on file  . Minutes of Exercise per Session: Not on file  Stress:   . Feeling of Stress : Not on file  Social Connections:   . Frequency of Communication with Friends and Family: Not on file  . Frequency of Social Gatherings with Friends and Family: Not on file  . Attends Religious Services:  Not on file  . Active Member of Clubs or Organizations: Not on file  . Attends Banker Meetings: Not on file  . Marital Status: Not on file  Intimate Partner Violence:   . Fear of Current or Ex-Partner: Not on file  . Emotionally Abused: Not on file  . Physically Abused: Not on file  . Sexually Abused: Not on file   Family History  Problem Relation Age of Onset  . Hypertension Mother   . Diabetes Mother   . Hypertension Father     Current Outpatient Medications  Medication Sig Dispense Refill  . aspirin 81 MG tablet Take 81 mg by mouth daily.    Marland Kitchen atenolol (TENORMIN) 50 MG tablet Take 50 mg by mouth daily.    . clopidogrel (PLAVIX) 75 MG tablet Take 1 tablet (75 mg total) by mouth daily. 30 tablet 6  . feeding supplement, ENSURE ENLIVE, (ENSURE ENLIVE) LIQD Take 237 mLs 2 (two) times daily between meals by mouth. 237 mL 12  . hydrochlorothiazide (HYDRODIURIL) 25 MG tablet Take 25 mg by mouth daily.    Marland Kitchen losartan (COZAAR) 100 MG tablet Take 100 mg by mouth daily.    Marland Kitchen losartan-hydrochlorothiazide (HYZAAR)  100-25 MG tablet     . rosuvastatin (CRESTOR) 20 MG tablet Take 20 mg by mouth daily.  1  . nitroGLYCERIN (NITRODUR - DOSED IN MG/24 HR) 0.2 mg/hr patch Place 1 patch (0.2 mg total) onto the skin daily. (Patient not taking: Reported on 09/19/2019) 30 patch 1  . oxyCODONE-acetaminophen (PERCOCET) 5-325 MG tablet Take 1 tablet by mouth every 4 (four) hours as needed for severe pain. 12 tablet 0   No current facility-administered medications for this visit.    No Known Allergies   REVIEW OF SYSTEMS:  Review of Systems  Constitutional: Negative for chills, fever and malaise/fatigue.  HENT: Negative for congestion and sore throat.   Eyes: Negative for blurred vision.  Respiratory: Negative for cough and shortness of breath.   Cardiovascular: Negative for chest pain, palpitations and leg swelling.  Gastrointestinal: Negative for abdominal pain, constipation, diarrhea, heartburn, nausea and vomiting.  Musculoskeletal: Negative for myalgias.  Neurological: Negative for dizziness, weakness and headaches.  Endo/Heme/Allergies: Does not bruise/bleed easily.    PHYSICAL EXAMINATION:  Vitals:   09/19/19 0855  BP: (!) 152/69  Pulse: (!) 51  Resp: 16  Temp: 97.6 F (36.4 C)  TempSrc: Temporal  SpO2: 100%  Weight: 145 lb (65.8 kg)  Height: 5\' 6"  (1.676 m)    General:  WDWN in NAD; vital signs documented above Gait: Normal but favoring left foot HENT: WNL, normocephalic Pulmonary: normal non-labored breathing , without wheezing Cardiac: regular HR, without  Murmurs without carotid bruit Abdomen: soft, NT, no masses Vascular Exam/Pulses:  Right Left  Radial 2+ (normal) 2+ (normal)  Femoral 2+ (normal) 2+ (normal)  Popliteal Not palpable absent  DP 1+ (weak) absent  PT 1+ (weak) absent  Left distal leg and foot cool. Motor and sensation intact. Left forefoot and toes very tender to palpation Extremities: without ischemic changes, without Gangrene , without cellulitis; without open  wounds;  Musculoskeletal: no muscle wasting or atrophy  Neurologic: A&O X 3;  No focal weakness or paresthesias are detected Psychiatric:  The pt has Normal affect.   Non-Invasive Vascular Imaging:    +-------+-----------+-----------+------------+------------+  ABI/TBIToday's ABIToday's TBIPrevious ABIPrevious TBI  +-------+-----------+-----------+------------+------------+  Right 0.66    0.52    0.58    0.55      +-------+-----------+-----------+------------+------------+  Left  0.3    0     1.08    0.85      +-------+-----------+-----------+------------+------------+   Left Graft #1: Femoral popliteal  +--------------------+--------+--------+----------+--------+            PSV cm/sStenosisWaveform Comments  +--------------------+--------+--------+----------+--------+  Inflow       55       triphasic       +--------------------+--------+--------+----------+--------+  Proximal Anastomosis    occluded           +--------------------+--------+--------+----------+--------+  Proximal Graft       occluded           +--------------------+--------+--------+----------+--------+  Mid Graft          occluded           +--------------------+--------+--------+----------+--------+  Distal Graft        occluded           +--------------------+--------+--------+----------+--------+  Distal Anastomosis     occluded           +--------------------+--------+--------+----------+--------+  Outflow       18       monophasic      +--------------------+--------+--------+----------+--------+     ASSESSMENT/PLAN:: 68 y.o. male here for follow up for peripheral arterial disease. He presents today with acute limb ischemia. He is having claudication and rest pain symptoms in left foot.  His ABI/TBI are significantly reduced from 4 weeks ago and his left lower extremity bypass graft is occluded on duplex today. He has known posterior tibial and peroneal artery runoff on the left based on his last arteriogram findings from 2019. He will need repeat angiogram with possible lysis to attempt to open his bypass graft or further determine his revascularization options. Considering that it has been 3 weeks since the bypass likely became occluded lysis may not be successful. This will be the second time he has occluded his left lower extremity bypass graft so his options are certainly limited. He is known to have no usable vein on past vein mapping. I have discussed this with Dr. Cain. He has offered to place lysis catheter on Monday 8/23 and will have Dr. Brabham check lysis catheter on Tuesday 8/24. Discussed with the patient and his wife and all questioned fully answered. I have sent prescription for pain medication to patients pharmacy to help in pain management until Monday.   Tisa Weisel, PA-C [facility] [phone]  Clinic MD:  Dr. Cain 

## 2019-09-19 NOTE — Telephone Encounter (Signed)
Notified Mrs. Alberson of procedure time change. Mr. Southwell is to be at [facility] at 11:30 on 8/23

## 2019-09-19 NOTE — Progress Notes (Signed)
Office Note     CC:  follow up Requesting Provider:  Mirna Mires, MD  HPI: Ryan [name] is a 69 y.o. (1950-05-26) male who presents for follow up of peripheral arterial disease. He has history of left common femoral artery to below knee popliteal bypass graft with PTFE and left profundofemoral endarterectomy on 12/14/16 by Dr. Imogene Burn. He had to subsequently have a redo left femoral to below knee popliteal bypass with PTFE by Dr. Chestine Spore on 01/28/18.  He was last seen on 08/12/19 for follow up. At the time he was complaining of bilateral plantar foot numbness, left greater than right which seemed to be worsening. He otherwise did not have rest pain or claudication symptoms. His ABI/TBI's were stable. He did have 50-70% stenosis noted on duplex evaluation in the proximal segment of the femoral to below knee popliteal bypass graft. He was scheduled for 3 month follow up.  He presents for earlier follow up today due to acute onset of left lower extremity pain that started on 08/27/19. He and his wife said that they tried to follow up on the day it occurred and also prior to today's visit with some difficulty. The patient explains that he was hanging some cabinets and was up and down a ladder when all the sudden he had sharp pain shoot down his leg and into his left foot. He says that since then the forefoot constantly hurts and is worsened by ambulation. This pain will also wake him up at night intermittently. He additionally has sharp pains that shoot from knee down into foot that occur on ambulation with some improvement with rest. He also feels that he has increased numbness in the left forefoot and it is very tender to touch and it is cooler. He has had numbness in the left toes since his original bypass in 2018 so that has not changed. He is struggling to bear weight on the foot. He has been taking tylenol for pain but it is not helping. He denies any pain in right leg or claudication symptoms. He says he  has been compliant with his Aspirin, Statin and Plavix. He does still smoke a couple cigarettes a day but he is working on quitting  The pt is on a statin for cholesterol management.  The pt is on a daily aspirin.   Other AC: Plavix The pt is on BB, HCTZ, ARB for hypertension.   The pt is not diabetic.  Tobacco hx: current smoker, several cigarettes per day  Past Medical History:  Diagnosis Date  . Hypertension   . PVD (peripheral vascular disease) (HCC)     Past Surgical History:  Procedure Laterality Date  . ABDOMINAL AORTAGRAM Left 12/13/2016   ABDOMINAL AORTOGRAM W/LOWER EXTREMITY/notes 12/13/2016  . ABDOMINAL AORTOGRAM W/LOWER EXTREMITY N/A 11/28/2016   Procedure: ABDOMINAL AORTOGRAM W/LOWER EXTREMITY;  Surgeon: Yates Decamp, MD;  Location: [facility] INVASIVE CV LAB;  Service: Cardiovascular;  Laterality: N/A;  bilateral  . ABDOMINAL AORTOGRAM W/LOWER EXTREMITY N/A 12/13/2016   Procedure: ABDOMINAL AORTOGRAM W/LOWER EXTREMITY;  Surgeon: Maeola Harman, MD;  Location: [facility] INVASIVE CV LAB;  Service: Cardiovascular;  Laterality: N/A;  unilater left leg  . ABDOMINAL AORTOGRAM W/LOWER EXTREMITY N/A 01/09/2018   Procedure: ABDOMINAL AORTOGRAM W/LOWER EXTREMITY;  Surgeon: Cephus Shelling, MD;  Location: [facility] INVASIVE CV LAB;  Service: Cardiovascular;  Laterality: N/A;  . FEMORAL-POPLITEAL BYPASS GRAFT Left 12/14/2016   Procedure: BYPASS GRAFT COMMON FEMORAL- BELOW KNEE POPLITEAL ARTERY with Bovine Patch to Below the knee poplitieal  artery.;  Surgeon: Fransisco Hertz, MD;  Location: [facility] OR;  Service: Vascular;  Laterality: Left;  . FEMORAL-POPLITEAL BYPASS GRAFT Left 01/28/2018   Procedure: BYPASS GRAFT FEMORAL-BELOW KNEE POPLITEAL ARTERY REDO with propaten vascular graft removable ring;  Surgeon: Cephus Shelling, MD;  Location: [facility] OR;  Service: Vascular;  Laterality: Left;  . FEMUR SURGERY Right 2009   pt. has a rod in that leg  . LOWER EXTREMITY ANGIOGRAPHY Left 11/28/2016    Procedure: Lower Extremity Angiography;  Surgeon: Yates Decamp, MD;  Location: [facility] INVASIVE CV LAB;  Service: Cardiovascular;  Laterality: Left;  lysis followup lower leg  . PATCH ANGIOPLASTY Left 01/28/2018   Procedure: PATCH ANGIOPLASTY USING Livia Snellen BIOLOGIC PATCH;  Surgeon: Cephus Shelling, MD;  Location: [facility] OR;  Service: Vascular;  Laterality: Left;  . PERIPHERAL VASCULAR BALLOON ANGIOPLASTY  11/28/2016   Procedure: PERIPHERAL VASCULAR BALLOON ANGIOPLASTY;  Surgeon: Yates Decamp, MD;  Location: [facility];  Service: Cardiovascular;;  SFA  . PERIPHERAL VASCULAR CATHETERIZATION N/A 08/11/2014   Procedure: Lower Extremity Angiography;  Surgeon: Yates Decamp, MD;  Location: [facility] INVASIVE CV LAB;  Service: Cardiovascular;  Laterality: N/A;  . PERIPHERAL VASCULAR THROMBECTOMY  11/28/2016   Procedure: PERIPHERAL VASCULAR THROMBECTOMY;  Surgeon: Yates Decamp, MD;  Location: [facility] INVASIVE CV LAB;  Service: Cardiovascular;;  Profunda  . PERIPHERAL VASCULAR THROMBECTOMY  11/28/2016   Procedure: PERIPHERAL VASCULAR THROMBECTOMY;  Surgeon: Yates Decamp, MD;  Location: [facility];  Service: Cardiovascular;;  . THROMBECTOMY FEMORAL ARTERY Left 12/14/2016   Procedure: THROMBECTOMY PROFUNDA FEMORAL ARTERY;  Surgeon: Fransisco Hertz, MD;  Location: [facility] OR;  Service: Vascular;  Laterality: Left;    Social History   Socioeconomic History  . Marital status: Married    Spouse name: Not on file  . Number of children: Not on file  . Years of education: Not on file  . Highest education level: Not on file  Occupational History  . Not on file  Tobacco Use  . Smoking status: Current Every Day Smoker    Packs/day: 0.10    Years: 56.00    Pack years: 5.60    Types: Cigarettes  . Smokeless tobacco: Never Used  Vaping Use  . Vaping Use: Never used  Substance and Sexual Activity  . Alcohol use: Yes    Alcohol/week: 10.0 standard drinks    Types: 10 Shots of liquor per week    Comment:  01/29/2018 "1-2  shots per night"  . Drug use: No  . Sexual activity: Not on file  Other Topics Concern  . Not on file  Social History Narrative  . Not on file   Social Determinants of Health   Financial Resource Strain:   . Difficulty of Paying Living Expenses: Not on file  Food Insecurity:   . Worried About Programme researcher, broadcasting/film/video in the Last Year: Not on file  . Ran Out of Food in the Last Year: Not on file  Transportation Needs:   . Lack of Transportation (Medical): Not on file  . Lack of Transportation (Non-Medical): Not on file  Physical Activity:   . Days of Exercise per Week: Not on file  . Minutes of Exercise per Session: Not on file  Stress:   . Feeling of Stress : Not on file  Social Connections:   . Frequency of Communication with Friends and Family: Not on file  . Frequency of Social Gatherings with Friends and Family: Not on file  . Attends Religious Services:  Not on file  . Active Member of Clubs or Organizations: Not on file  . Attends Banker Meetings: Not on file  . Marital Status: Not on file  Intimate Partner Violence:   . Fear of Current or Ex-Partner: Not on file  . Emotionally Abused: Not on file  . Physically Abused: Not on file  . Sexually Abused: Not on file   Family History  Problem Relation Age of Onset  . Hypertension Mother   . Diabetes Mother   . Hypertension Father     Current Outpatient Medications  Medication Sig Dispense Refill  . aspirin 81 MG tablet Take 81 mg by mouth daily.    Marland Kitchen atenolol (TENORMIN) 50 MG tablet Take 50 mg by mouth daily.    . clopidogrel (PLAVIX) 75 MG tablet Take 1 tablet (75 mg total) by mouth daily. 30 tablet 6  . feeding supplement, ENSURE ENLIVE, (ENSURE ENLIVE) LIQD Take 237 mLs 2 (two) times daily between meals by mouth. 237 mL 12  . hydrochlorothiazide (HYDRODIURIL) 25 MG tablet Take 25 mg by mouth daily.    Marland Kitchen losartan (COZAAR) 100 MG tablet Take 100 mg by mouth daily.    Marland Kitchen losartan-hydrochlorothiazide (HYZAAR)  100-25 MG tablet     . rosuvastatin (CRESTOR) 20 MG tablet Take 20 mg by mouth daily.  1  . nitroGLYCERIN (NITRODUR - DOSED IN MG/24 HR) 0.2 mg/hr patch Place 1 patch (0.2 mg total) onto the skin daily. (Patient not taking: Reported on 09/19/2019) 30 patch 1  . oxyCODONE-acetaminophen (PERCOCET) 5-325 MG tablet Take 1 tablet by mouth every 4 (four) hours as needed for severe pain. 12 tablet 0   No current facility-administered medications for this visit.    No Known Allergies   REVIEW OF SYSTEMS:  Review of Systems  Constitutional: Negative for chills, fever and malaise/fatigue.  HENT: Negative for congestion and sore throat.   Eyes: Negative for blurred vision.  Respiratory: Negative for cough and shortness of breath.   Cardiovascular: Negative for chest pain, palpitations and leg swelling.  Gastrointestinal: Negative for abdominal pain, constipation, diarrhea, heartburn, nausea and vomiting.  Musculoskeletal: Negative for myalgias.  Neurological: Negative for dizziness, weakness and headaches.  Endo/Heme/Allergies: Does not bruise/bleed easily.    PHYSICAL EXAMINATION:  Vitals:   09/19/19 0855  BP: (!) 152/69  Pulse: (!) 51  Resp: 16  Temp: 97.6 F (36.4 C)  TempSrc: Temporal  SpO2: 100%  Weight: 145 lb (65.8 kg)  Height: 5\' 6"  (1.676 m)    General:  WDWN in NAD; vital signs documented above Gait: Normal but favoring left foot HENT: WNL, normocephalic Pulmonary: normal non-labored breathing , without wheezing Cardiac: regular HR, without  Murmurs without carotid bruit Abdomen: soft, NT, no masses Vascular Exam/Pulses:  Right Left  Radial 2+ (normal) 2+ (normal)  Femoral 2+ (normal) 2+ (normal)  Popliteal Not palpable absent  DP 1+ (weak) absent  PT 1+ (weak) absent  Left distal leg and foot cool. Motor and sensation intact. Left forefoot and toes very tender to palpation Extremities: without ischemic changes, without Gangrene , without cellulitis; without open  wounds;  Musculoskeletal: no muscle wasting or atrophy  Neurologic: A&O X 3;  No focal weakness or paresthesias are detected Psychiatric:  The pt has Normal affect.   Non-Invasive Vascular Imaging:    +-------+-----------+-----------+------------+------------+  ABI/TBIToday's ABIToday's TBIPrevious ABIPrevious TBI  +-------+-----------+-----------+------------+------------+  Right 0.66    0.52    0.58    0.55      +-------+-----------+-----------+------------+------------+  Left  0.3    0     1.08    0.85      +-------+-----------+-----------+------------+------------+   Left Graft #1: Femoral popliteal  +--------------------+--------+--------+----------+--------+            PSV cm/sStenosisWaveform Comments  +--------------------+--------+--------+----------+--------+  Inflow       55       triphasic       +--------------------+--------+--------+----------+--------+  Proximal Anastomosis    occluded           +--------------------+--------+--------+----------+--------+  Proximal Graft       occluded           +--------------------+--------+--------+----------+--------+  Mid Graft          occluded           +--------------------+--------+--------+----------+--------+  Distal Graft        occluded           +--------------------+--------+--------+----------+--------+  Distal Anastomosis     occluded           +--------------------+--------+--------+----------+--------+  Outflow       18       monophasic      +--------------------+--------+--------+----------+--------+     ASSESSMENT/PLAN:: 69 y.o. male here for follow up for peripheral arterial disease. He presents today with acute limb ischemia. He is having claudication and rest pain symptoms in left foot.  His ABI/TBI are significantly reduced from 4 weeks ago and his left lower extremity bypass graft is occluded on duplex today. He has known posterior tibial and peroneal artery runoff on the left based on his last arteriogram findings from 2019. He will need repeat angiogram with possible lysis to attempt to open his bypass graft or further determine his revascularization options. Considering that it has been 3 weeks since the bypass likely became occluded lysis may not be successful. This will be the second time he has occluded his left lower extremity bypass graft so his options are certainly limited. He is known to have no usable vein on past vein mapping. I have discussed this with Dr. [name]. He has offered to place lysis catheter on Monday 8/23 and will have Dr. Myra GianottiBrabham check lysis catheter on Tuesday 8/24. Discussed with the patient and his wife and all questioned fully answered. I have sent prescription for pain medication to patients pharmacy to help in pain management until Monday.   Graceann Congressorrina Anees Vanecek, PA-C [facility] [phone]  Clinic MD:  Dr. [name]

## 2019-09-22 ENCOUNTER — Inpatient Hospital Stay (HOSPITAL_COMMUNITY)
Admission: AC | Admit: 2019-09-22 | Discharge: 2019-09-24 | DRG: 254 | Disposition: A | Discharge status: Home / Self Care | Payer: Medicare Other | Attending: Vascular Surgery | Admitting: Vascular Surgery

## 2019-09-22 ENCOUNTER — Inpatient Hospital Stay (HOSPITAL_COMMUNITY)
Admission: AC | Disposition: A | Discharge status: Home / Self Care | Payer: Self-pay | Source: Home / Self Care | Attending: Vascular Surgery

## 2019-09-22 ENCOUNTER — Other Ambulatory Visit: Payer: Self-pay

## 2019-09-22 ENCOUNTER — Encounter (HOSPITAL_COMMUNITY): Payer: Self-pay | Admitting: Vascular Surgery

## 2019-09-22 DIAGNOSIS — I10 Essential (primary) hypertension: Secondary | ICD-10-CM | POA: Diagnosis not present

## 2019-09-22 DIAGNOSIS — M79675 Pain in left toe(s): Secondary | ICD-10-CM | POA: Diagnosis not present

## 2019-09-22 DIAGNOSIS — Z7902 Long term (current) use of antithrombotics/antiplatelets: Secondary | ICD-10-CM

## 2019-09-22 DIAGNOSIS — Y838 Other surgical procedures as the cause of abnormal reaction of the patient, or of later complication, without mention of misadventure at the time of the procedure: Secondary | ICD-10-CM | POA: Diagnosis present

## 2019-09-22 DIAGNOSIS — Z7982 Long term (current) use of aspirin: Secondary | ICD-10-CM

## 2019-09-22 DIAGNOSIS — I70221 Atherosclerosis of native arteries of extremities with rest pain, right leg: Secondary | ICD-10-CM | POA: Diagnosis not present

## 2019-09-22 DIAGNOSIS — Z833 Family history of diabetes mellitus: Secondary | ICD-10-CM

## 2019-09-22 DIAGNOSIS — Z8249 Family history of ischemic heart disease and other diseases of the circulatory system: Secondary | ICD-10-CM

## 2019-09-22 DIAGNOSIS — I70322 Atherosclerosis of unspecified type of bypass graft(s) of the extremities with rest pain, left leg: Principal | ICD-10-CM | POA: Diagnosis present

## 2019-09-22 DIAGNOSIS — Z95828 Presence of other vascular implants and grafts: Secondary | ICD-10-CM

## 2019-09-22 DIAGNOSIS — I739 Peripheral vascular disease, unspecified: Secondary | ICD-10-CM | POA: Diagnosis present

## 2019-09-22 DIAGNOSIS — F17210 Nicotine dependence, cigarettes, uncomplicated: Secondary | ICD-10-CM | POA: Diagnosis not present

## 2019-09-22 DIAGNOSIS — Y829 Unspecified medical devices associated with adverse incidents: Secondary | ICD-10-CM | POA: Diagnosis present

## 2019-09-22 DIAGNOSIS — Z20822 Contact with and (suspected) exposure to covid-19: Secondary | ICD-10-CM | POA: Diagnosis present

## 2019-09-22 DIAGNOSIS — Z23 Encounter for immunization: Secondary | ICD-10-CM

## 2019-09-22 DIAGNOSIS — M79605 Pain in left leg: Secondary | ICD-10-CM | POA: Diagnosis not present

## 2019-09-22 HISTORY — PX: PERIPHERAL VASCULAR INTERVENTION: CATH118257

## 2019-09-22 HISTORY — PX: ABDOMINAL AORTOGRAM W/LOWER EXTREMITY: CATH118223

## 2019-09-22 LAB — POCT I-STAT, CHEM 8
BUN: 49 mg/dL — ABNORMAL HIGH (ref 8–23)
Calcium, Ion: 1.21 mmol/L (ref 1.15–1.40)
Chloride: 106 mmol/L (ref 98–111)
Creatinine, Ser: 1.1 mg/dL (ref 0.61–1.24)
Glucose, Bld: 89 mg/dL (ref 70–99)
HCT: 46 % (ref 39.0–52.0)
Hemoglobin: 15.6 g/dL (ref 13.0–17.0)
Potassium: 5 mmol/L (ref 3.5–5.1)
Sodium: 137 mmol/L (ref 135–145)
TCO2: 23 mmol/L (ref 22–32)

## 2019-09-22 LAB — MRSA PCR SCREENING: MRSA by PCR: NEGATIVE

## 2019-09-22 SURGERY — ABDOMINAL AORTOGRAM W/LOWER EXTREMITY
Anesthesia: LOCAL | Laterality: Left

## 2019-09-22 MED ORDER — HEPARIN (PORCINE) IN NACL 1000-0.9 UT/500ML-% IV SOLN
INTRAVENOUS | Status: AC
  Filled 2019-09-22: qty 1000

## 2019-09-22 MED ORDER — ROSUVASTATIN CALCIUM 5 MG PO TABS
10.0000 mg | ORAL_TABLET | Freq: Every day | ORAL | Status: DC
  Administered 2019-09-23 – 2019-09-24 (×2): 10 mg via ORAL
  Filled 2019-09-22 (×2): qty 2

## 2019-09-22 MED ORDER — SODIUM CHLORIDE 0.9 % IV SOLN
0.5000 mg/h | INTRAVENOUS | Status: DC
  Administered 2019-09-22: 0.5 mg/h
  Filled 2019-09-22: qty 10

## 2019-09-22 MED ORDER — MIDAZOLAM HCL 2 MG/2ML IJ SOLN: 1.0000 mg | INTRAMUSCULAR | Status: DC | PRN

## 2019-09-22 MED ORDER — HEPARIN (PORCINE) 25000 UT/250ML-% IV SOLN
500.0000 [IU]/h | INTRAVENOUS | Status: DC
  Administered 2019-09-22: 500 [IU]/h via INTRAVENOUS
  Filled 2019-09-22: qty 250

## 2019-09-22 MED ORDER — CLOPIDOGREL BISULFATE 75 MG PO TABS
75.0000 mg | ORAL_TABLET | Freq: Every day | ORAL | Status: DC
  Administered 2019-09-23 – 2019-09-24 (×2): 75 mg via ORAL
  Filled 2019-09-22 (×2): qty 1

## 2019-09-22 MED ORDER — OXYCODONE HCL 5 MG PO TABS
5.0000 mg | ORAL_TABLET | ORAL | Status: DC | PRN
  Administered 2019-09-22: 10 mg via ORAL
  Administered 2019-09-22: 5 mg via ORAL
  Administered 2019-09-23: 10 mg via ORAL
  Filled 2019-09-22 (×2): qty 2
  Filled 2019-09-22: qty 1

## 2019-09-22 MED ORDER — FENTANYL CITRATE (PF) 100 MCG/2ML IJ SOLN
INTRAMUSCULAR | Status: AC
  Filled 2019-09-22: qty 2

## 2019-09-22 MED ORDER — SODIUM CHLORIDE 0.9% FLUSH: 3.0000 mL | INTRAVENOUS | Status: DC | PRN

## 2019-09-22 MED ORDER — MIDAZOLAM HCL 2 MG/2ML IJ SOLN
INTRAMUSCULAR | Status: DC | PRN
  Administered 2019-09-22: 1 mg via INTRAVENOUS

## 2019-09-22 MED ORDER — LIDOCAINE HCL (PF) 1 % IJ SOLN
INTRAMUSCULAR | Status: DC | PRN
  Administered 2019-09-22: 20 mL via INTRADERMAL

## 2019-09-22 MED ORDER — HEPARIN (PORCINE) 25000 UT/250ML-% IV SOLN
700.0000 [IU]/h | INTRAVENOUS | Status: DC
  Administered 2019-09-22: 1000 [IU]/h via INTRAVENOUS

## 2019-09-22 MED ORDER — HEPARIN SODIUM (PORCINE) 1000 UNIT/ML IJ SOLN
INTRAMUSCULAR | Status: AC
  Filled 2019-09-22: qty 1

## 2019-09-22 MED ORDER — HEPARIN SODIUM (PORCINE) 1000 UNIT/ML IJ SOLN
INTRAMUSCULAR | Status: DC | PRN
  Administered 2019-09-22: 7000 [IU] via INTRAVENOUS

## 2019-09-22 MED ORDER — HYDRALAZINE HCL 20 MG/ML IJ SOLN: 5.0000 mg | INTRAMUSCULAR | Status: DC | PRN

## 2019-09-22 MED ORDER — MORPHINE SULFATE (PF) 2 MG/ML IV SOLN
2.0000 mg | INTRAVENOUS | Status: DC | PRN
  Administered 2019-09-23 (×2): 2 mg via INTRAVENOUS
  Filled 2019-09-22 (×2): qty 1

## 2019-09-22 MED ORDER — IODIXANOL 320 MG/ML IV SOLN
INTRAVENOUS | Status: DC | PRN
  Administered 2019-09-22: 135 mL via INTRA_ARTERIAL

## 2019-09-22 MED ORDER — LIDOCAINE HCL (PF) 1 % IJ SOLN
INTRAMUSCULAR | Status: AC
  Filled 2019-09-22: qty 30

## 2019-09-22 MED ORDER — LABETALOL HCL 5 MG/ML IV SOLN: 10.0000 mg | INTRAVENOUS | Status: DC | PRN

## 2019-09-22 MED ORDER — SODIUM CHLORIDE 0.9 % IV SOLN: INTRAVENOUS | Status: DC

## 2019-09-22 MED ORDER — SODIUM CHLORIDE 0.9 % IV SOLN: INTRAVENOUS | Status: AC

## 2019-09-22 MED ORDER — MIDAZOLAM HCL 2 MG/2ML IJ SOLN
INTRAMUSCULAR | Status: AC
  Filled 2019-09-22: qty 2

## 2019-09-22 MED ORDER — MORPHINE SULFATE (PF) 4 MG/ML IV SOLN: 5.0000 mg | INTRAVENOUS | Status: DC | PRN

## 2019-09-22 MED ORDER — SODIUM CHLORIDE 0.9 % IV SOLN: 250.0000 mL | INTRAVENOUS | Status: DC | PRN

## 2019-09-22 MED ORDER — CHLORHEXIDINE GLUCONATE CLOTH 2 % EX PADS
6.0000 | MEDICATED_PAD | Freq: Every day | CUTANEOUS | Status: DC
  Administered 2019-09-22 – 2019-09-23 (×2): 6 via TOPICAL

## 2019-09-22 MED ORDER — SODIUM CHLORIDE 0.9 % IV SOLN
0.5000 mg/h | INTRAVENOUS | Status: DC
  Administered 2019-09-23: 0.5 mg/h
  Filled 2019-09-22 (×4): qty 10

## 2019-09-22 MED ORDER — FENTANYL CITRATE (PF) 100 MCG/2ML IJ SOLN
INTRAMUSCULAR | Status: DC | PRN
  Administered 2019-09-22: 50 ug via INTRAVENOUS

## 2019-09-22 MED ORDER — SODIUM CHLORIDE 0.9% FLUSH
3.0000 mL | Freq: Two times a day (BID) | INTRAVENOUS | Status: DC
  Administered 2019-09-22 – 2019-09-24 (×3): 3 mL via INTRAVENOUS

## 2019-09-22 MED ORDER — ASPIRIN EC 81 MG PO TBEC
81.0000 mg | DELAYED_RELEASE_TABLET | Freq: Every day | ORAL | Status: DC
  Administered 2019-09-23 – 2019-09-24 (×2): 81 mg via ORAL
  Filled 2019-09-22 (×2): qty 1

## 2019-09-22 MED ORDER — PNEUMOCOCCAL VAC POLYVALENT 25 MCG/0.5ML IJ INJ: 0.5000 mL | INJECTION | INTRAMUSCULAR | Status: DC

## 2019-09-22 MED ORDER — ACETAMINOPHEN 325 MG PO TABS
650.0000 mg | ORAL_TABLET | ORAL | Status: DC | PRN
  Administered 2019-09-23 – 2019-09-24 (×2): 650 mg via ORAL
  Filled 2019-09-22 (×2): qty 2

## 2019-09-22 MED ORDER — CLONIDINE HCL 0.2 MG PO TABS: 0.2000 mg | ORAL_TABLET | ORAL | Status: DC | PRN

## 2019-09-22 MED ORDER — ONDANSETRON HCL 4 MG/2ML IJ SOLN: 4.0000 mg | Freq: Four times a day (QID) | INTRAMUSCULAR | Status: DC | PRN

## 2019-09-22 SURGICAL SUPPLY — 16 items
CATH ANGIO 5F BER2 65CM (CATHETERS) ×2 IMPLANT
CATH INFUS 135CMX50CM (CATHETERS) ×2 IMPLANT
CATH OMNI FLUSH 5F 65CM (CATHETERS) ×2 IMPLANT
CATH TEMPO AQUA 5F 100CM (CATHETERS) ×2 IMPLANT
DEVICE TORQUE .025-.038 (MISCELLANEOUS) ×2 IMPLANT
GUIDEWIRE ANGLED .035X260CM (WIRE) ×2 IMPLANT
KIT MICROPUNCTURE NIT STIFF (SHEATH) ×2 IMPLANT
KIT PV (KITS) ×2 IMPLANT
SHEATH PINNACLE 5F 10CM (SHEATH) ×2 IMPLANT
SHEATH PINNACLE MP 6F 45CM (SHEATH) ×2 IMPLANT
SHEATH PROBE COVER 6X72 (BAG) ×2 IMPLANT
SYR MEDRAD MARK V 150ML (SYRINGE) ×2 IMPLANT
TRANSDUCER W/STOPCOCK (MISCELLANEOUS) ×2 IMPLANT
TRAY PV CATH (CUSTOM PROCEDURE TRAY) ×2 IMPLANT
WIRE BENTSON .035X145CM (WIRE) ×2 IMPLANT
WIRE ROSEN-J .035X260CM (WIRE) ×2 IMPLANT

## 2019-09-22 NOTE — Interval H&P Note (Signed)
History and Physical Interval Note:  09/22/2019 12:26 PM  Ryan [name]  has presented today for surgery, with the diagnosis of PAD.  The various methods of treatment have been discussed with the patient and family. After consideration of risks, benefits and other options for treatment, the patient has consented to  Procedure(s): ABDOMINAL AORTOGRAM W/LOWER EXTREMITY (Left) as a surgical intervention.  The patient's history has been reviewed, patient examined, no change in status, stable for surgery.  I have reviewed the patient's chart and labs.  Questions were answered to the patient's satisfaction.     [name]

## 2019-09-22 NOTE — Op Note (Signed)
° ° °  Patient name: Ryan [name] MRN: [id number] DOB: 1950/04/06 Sex: male  09/22/2019 Pre-operative Diagnosis: critical right lower extremity ischemia with rest pain Post-operative diagnosis:  Same Surgeon:  Luanna Salk. Randie Heinz, MD Procedure Performed: 1.  Ultrasound-guided cannulation of right common femoral artery 2.  Aortogram with bilateral lower extremity runoff 3.  Selection of left popliteal artery and placement of 50 cm treatment length UniFuse catheter 4.  Moderate sedation with fentanyl and Versed for 37 minutes  Indications: 69 year old male with bilateral SFA stenting also has 2 previous bypass grafts placed on the left.  He now has occlusion of his bypass graft on the left with rest pain and severely decreased ABI.  He is indicated for aortogram possible placement of lytic catheter.  Findings: Aortoiliac segments are free of flow-limiting stenosis.  Bilaterally there are occluded SFA stents.  On the right side he reconstitutes above-knee popliteal artery with runoff via 3 vessels.  The left side he reconstitutes below-knee popliteal artery which does retrograde fill into the behind the knee popliteal artery.  He has peroneal PT artery runoff to the ankle.  After selection of bypass graft we were able to place a lytic catheter that terminates at the below-knee popliteal artery and originates at the common femoral artery.  Patient will need lytic recheck within 24 to 48 hours.   Procedure:  The patient was identified in the holding area and taken to room 8.  The patient was then placed supine on the table and prepped and draped in the usual sterile fashion.  A time out was called.  Ultrasound was used to evaluate the right common femoral artery.  I did visualize a stent in the proximal SFA which appeared occluded.  We anesthetized the area with 1% lidocaine cannulated with micropuncture needle followed by wire sheath.  And images saved the permanent record.  Bentson wires placed followed by 5  [place] sheath.  We then placed an Omni catheter to L1 performed aortogram followed bilateral lower extremity runoff.  With the above findings we crossed the bifurcation with Bentson wire.  We then placed a long 6 French sheath in the left common femoral artery patient was fully heparinized.  We used a Berenstein catheter and Glidewire to select the occluded graft.  We were able to get all the way through to the below-knee popliteal artery placed a straight catheter and confirmed intraluminal access.  We then placed a Rosen wire placed a 50 cm treatment length UniFuse catheter.  This was flushed.  The sheath was flushed.  Catheter was hooked to TPA for injection and sheath will be hooked to heparin.  He tolerated procedure without any complication.   Contrast: 135 cc  Charlies Rayburn C. Randie Heinz, MD [facility] [place] Office: [phone] Pager: [phone]

## 2019-09-22 NOTE — Progress Notes (Addendum)
ANTICOAGULATION CONSULT NOTE  Pharmacy Consult for heparin Indication: VTE treatment  Heparin Dosing Weight: 67.1 kg  Labs: Recent Labs    09/22/19 1247  HGB 15.6  HCT 46.0  CREATININE 1.10    Assessment: 68 yom with history of bilateral SFA stenting and 2 previous bypass grafts placed on the left, presenting with occlusion of bypass graft on left with rest pain and severely decreased ABI. Patient now s/p aortogram with Vascular Surgery and placement of lytic catheter. Alteplase currently running at 0.5 mg/hr via catheter. Pharmacy consulted to dose heparin post-op targeting level 0.2-0.5, already started at 500 units/hr currently. Hg wnl, no platelets available yet. No active bleed issues reported per discussion with RN.  Goal of Therapy:  Heparin level 0.2-0.5 units/ml Monitor platelets by anticoagulation protocol: Yes   Plan:  Increase heparin to 1000 units/hr to target level 0.2-0.5 post-op Continue alteplase 0.5 mg/hr IK per Vascular Check q6h heparin level/CBC/fibrinogen x 4 per protocol Monitor for s/sx bleeding F/u further Vascular plans   Babs Bertin, PharmD, BCPS Clinical Pharmacist 09/22/2019 4:00 PM

## 2019-09-22 NOTE — Plan of Care (Signed)
  Problem: Clinical Measurements: Goal: Diagnostic test results will improve Outcome: Progressing Goal: Cardiovascular complication will be avoided Outcome: Progressing   Problem: Elimination: Goal: Will not experience complications related to urinary retention Outcome: Progressing   Problem: Pain Managment: Goal: General experience of comfort will improve Outcome: Progressing

## 2019-09-23 ENCOUNTER — Encounter (HOSPITAL_COMMUNITY): Payer: Self-pay | Admitting: Vascular Surgery

## 2019-09-23 ENCOUNTER — Inpatient Hospital Stay (HOSPITAL_COMMUNITY)
Admission: AC | Disposition: A | Discharge status: Home / Self Care | Payer: Self-pay | Source: Home / Self Care | Attending: Vascular Surgery

## 2019-09-23 DIAGNOSIS — I70221 Atherosclerosis of native arteries of extremities with rest pain, right leg: Secondary | ICD-10-CM

## 2019-09-23 HISTORY — PX: PERIPHERAL VASCULAR INTERVENTION: CATH118257

## 2019-09-23 HISTORY — PX: PULMONARY THROMBECTOMY: CATH118295

## 2019-09-23 LAB — CBC
HCT: 42.7 % (ref 39.0–52.0)
HCT: 41.5 % (ref 39.0–52.0)
Hemoglobin: 13.3 g/dL (ref 13.0–17.0)
Hemoglobin: 13 g/dL (ref 13.0–17.0)
MCH: 27 pg (ref 26.0–34.0)
MCH: 27.3 pg (ref 26.0–34.0)
MCHC: 31.1 g/dL (ref 30.0–36.0)
MCHC: 31.3 g/dL (ref 30.0–36.0)
MCV: 86.6 fL (ref 80.0–100.0)
MCV: 87.2 fL (ref 80.0–100.0)
Platelets: 176 10*3/uL (ref 150–400)
Platelets: 163 10*3/uL (ref 150–400)
RBC: 4.93 MIL/uL (ref 4.22–5.81)
RBC: 4.76 MIL/uL (ref 4.22–5.81)
RDW: 16.5 % — ABNORMAL HIGH (ref 11.5–15.5)
RDW: 16.4 % — ABNORMAL HIGH (ref 11.5–15.5)
WBC: 7.6 10*3/uL (ref 4.0–10.5)
WBC: 7.8 10*3/uL (ref 4.0–10.5)
nRBC: 0 % (ref 0.0–0.2)
nRBC: 0 % (ref 0.0–0.2)

## 2019-09-23 LAB — BASIC METABOLIC PANEL
Anion gap: 8 (ref 5–15)
BUN: 23 mg/dL (ref 8–23)
CO2: 24 mmol/L (ref 22–32)
Calcium: 9 mg/dL (ref 8.9–10.3)
Chloride: 102 mmol/L (ref 98–111)
Creatinine, Ser: 1.16 mg/dL (ref 0.61–1.24)
GFR calc Af Amer: >60 mL/min (ref >60)
GFR calc non Af Amer: >60 mL/min (ref >60)
Glucose, Bld: 111 mg/dL — ABNORMAL HIGH (ref 70–99)
Potassium: 4.7 mmol/L (ref 3.5–5.1)
Sodium: 134 mmol/L — ABNORMAL LOW (ref 135–145)

## 2019-09-23 LAB — POCT ACTIVATED CLOTTING TIME
Activated Clotting Time: 153 seconds
Activated Clotting Time: 334 seconds
Activated Clotting Time: 268 seconds
Activated Clotting Time: 191 seconds
Activated Clotting Time: 213 seconds
Activated Clotting Time: 169 seconds

## 2019-09-23 LAB — SURGICAL PCR SCREEN
MRSA, PCR: NEGATIVE
Staphylococcus aureus: NEGATIVE

## 2019-09-23 LAB — FIBRINOGEN
Fibrinogen: 623 mg/dL — ABNORMAL HIGH (ref 210–475)
Fibrinogen: 622 mg/dL — ABNORMAL HIGH (ref 210–475)

## 2019-09-23 LAB — HEPARIN LEVEL (UNFRACTIONATED)
Heparin Unfractionated: 0.65 IU/mL (ref 0.30–0.70)
Heparin Unfractionated: 0.56 IU/mL (ref 0.30–0.70)

## 2019-09-23 SURGERY — PULMONARY THROMBECTOMY
Anesthesia: LOCAL

## 2019-09-23 MED ORDER — MORPHINE SULFATE (PF) 2 MG/ML IV SOLN
2.0000 mg | INTRAVENOUS | Status: DC | PRN
  Administered 2019-09-23 (×2): 2 mg via INTRAVENOUS

## 2019-09-23 MED ORDER — HEPARIN SODIUM (PORCINE) 1000 UNIT/ML IJ SOLN
INTRAMUSCULAR | Status: AC
  Filled 2019-09-23: qty 1

## 2019-09-23 MED ORDER — ASPIRIN EC 81 MG PO TBEC: 81.0000 mg | DELAYED_RELEASE_TABLET | Freq: Every day | ORAL | Status: DC

## 2019-09-23 MED ORDER — SODIUM CHLORIDE 0.9 % IV SOLN: INTRAVENOUS | Status: DC

## 2019-09-23 MED ORDER — IODIXANOL 320 MG/ML IV SOLN
INTRAVENOUS | Status: DC | PRN
  Administered 2019-09-23: 60 mL via INTRA_ARTERIAL

## 2019-09-23 MED ORDER — FENTANYL CITRATE (PF) 100 MCG/2ML IJ SOLN
INTRAMUSCULAR | Status: DC | PRN
Start: 2019-09-23 — End: 2019-09-23
  Administered 2019-09-23: 50 ug via INTRAVENOUS

## 2019-09-23 MED ORDER — MIDAZOLAM HCL 2 MG/2ML IJ SOLN
INTRAMUSCULAR | Status: AC
  Filled 2019-09-23: qty 2

## 2019-09-23 MED ORDER — MORPHINE SULFATE (PF) 2 MG/ML IV SOLN
INTRAVENOUS | Status: AC
  Filled 2019-09-23: qty 1

## 2019-09-23 MED ORDER — CLOPIDOGREL BISULFATE 300 MG PO TABS
ORAL_TABLET | ORAL | Status: DC | PRN
  Administered 2019-09-23: 75 mg via ORAL

## 2019-09-23 MED ORDER — SODIUM CHLORIDE 0.9% FLUSH: 3.0000 mL | INTRAVENOUS | Status: DC | PRN

## 2019-09-23 MED ORDER — LABETALOL HCL 5 MG/ML IV SOLN: 10.0000 mg | INTRAVENOUS | Status: DC | PRN

## 2019-09-23 MED ORDER — ONDANSETRON HCL 4 MG/2ML IJ SOLN: 4.0000 mg | Freq: Four times a day (QID) | INTRAMUSCULAR | Status: DC | PRN

## 2019-09-23 MED ORDER — FENTANYL CITRATE (PF) 100 MCG/2ML IJ SOLN
INTRAMUSCULAR | Status: AC
  Filled 2019-09-23: qty 2

## 2019-09-23 MED ORDER — SODIUM CHLORIDE 0.9% FLUSH: 3.0000 mL | Freq: Two times a day (BID) | INTRAVENOUS | Status: DC

## 2019-09-23 MED ORDER — HEPARIN (PORCINE) IN NACL 1000-0.9 UT/500ML-% IV SOLN
INTRAVENOUS | Status: DC | PRN
  Administered 2019-09-23 (×2): 500 mL

## 2019-09-23 MED ORDER — HYDRALAZINE HCL 20 MG/ML IJ SOLN: 5.0000 mg | INTRAMUSCULAR | Status: DC | PRN

## 2019-09-23 MED ORDER — CLOPIDOGREL BISULFATE 75 MG PO TABS: 75.0000 mg | ORAL_TABLET | Freq: Every day | ORAL | Status: DC

## 2019-09-23 MED ORDER — CLOPIDOGREL BISULFATE 75 MG PO TABS
ORAL_TABLET | ORAL | Status: AC
  Filled 2019-09-23: qty 1

## 2019-09-23 MED ORDER — MIDAZOLAM HCL 2 MG/2ML IJ SOLN
INTRAMUSCULAR | Status: DC | PRN
  Administered 2019-09-23: 1 mg via INTRAVENOUS

## 2019-09-23 MED ORDER — SODIUM CHLORIDE 0.9 % IV SOLN: 250.0000 mL | INTRAVENOUS | Status: DC | PRN

## 2019-09-23 MED ORDER — HEPARIN (PORCINE) IN NACL 1000-0.9 UT/500ML-% IV SOLN
INTRAVENOUS | Status: AC
  Filled 2019-09-23: qty 1000

## 2019-09-23 MED ORDER — HEPARIN SODIUM (PORCINE) 1000 UNIT/ML IJ SOLN
INTRAMUSCULAR | Status: DC | PRN
  Administered 2019-09-23: 4000 [IU] via INTRAVENOUS
  Administered 2019-09-23: 3000 [IU] via INTRAVENOUS

## 2019-09-23 MED ORDER — ACETAMINOPHEN 325 MG PO TABS: 650.0000 mg | ORAL_TABLET | ORAL | Status: DC | PRN

## 2019-09-23 MED ORDER — LIDOCAINE HCL (PF) 1 % IJ SOLN
INTRAMUSCULAR | Status: DC | PRN
  Administered 2019-09-23: 5 mL via INTRADERMAL

## 2019-09-23 MED ORDER — LIDOCAINE HCL (PF) 1 % IJ SOLN
INTRAMUSCULAR | Status: AC
  Filled 2019-09-23: qty 30

## 2019-09-23 MED ORDER — HEPARIN (PORCINE) 25000 UT/250ML-% IV SOLN
650.0000 [IU]/h | INTRAVENOUS | Status: DC
  Administered 2019-09-24: 650 [IU]/h via INTRAVENOUS
  Filled 2019-09-23 (×2): qty 250

## 2019-09-23 MED ORDER — SODIUM CHLORIDE 0.9 % WEIGHT BASED INFUSION: 1.0000 mL/kg/h | INTRAVENOUS | Status: AC

## 2019-09-23 MED ORDER — OXYCODONE HCL 5 MG PO TABS: 5.0000 mg | ORAL_TABLET | ORAL | Status: DC | PRN

## 2019-09-23 MED FILL — Heparin Sod (Porcine)-NaCl IV Soln 1000 Unit/500ML-0.9%: INTRAVENOUS | Qty: 1000 | Status: AC

## 2019-09-23 SURGICAL SUPPLY — 12 items
BALLN STERLING OTW 5X60X135 (BALLOONS) ×3
BALLN STERLING OTW 6X40X135 (BALLOONS) ×3
BALLOON STERLING OTW 5X60X135 (BALLOONS) IMPLANT
BALLOON STERLING OTW 6X40X135 (BALLOONS) IMPLANT
KIT ENCORE 26 ADVANTAGE (KITS) ×1 IMPLANT
KIT PV (KITS) ×1 IMPLANT
SHEATH PINNACLE 6F 10CM (SHEATH) ×1 IMPLANT
STENT ELUVIA 7X40X130 (Permanent Stent) ×1 IMPLANT
STENT TIGRIS 5X60X120 (Permanent Stent) ×1 IMPLANT
TRAY PV CATH (CUSTOM PROCEDURE TRAY) ×1 IMPLANT
WIRE BENTSON .035X145CM (WIRE) ×1 IMPLANT
WIRE G V18X300CM (WIRE) ×1 IMPLANT

## 2019-09-23 NOTE — Progress Notes (Signed)
ANTICOAGULATION CONSULT NOTE - Follow Up Consult  Pharmacy Consult for heparin Indication: VTE tx w/ concurrent intracatheter lytic  Labs: Recent Labs    09/22/19 1247 09/23/19 0455  HGB 15.6 13.3  HCT 46.0 42.7  PLT  --  176  HEPARINUNFRC  --  0.65  CREATININE 1.10 1.16    Assessment: 69yo male supratherapeutic on heparin with initial dosing with concurrent lytic; no gtt issues or signs of bleeding per RN.  Goal of Therapy:  Heparin level 0.2-0.5 units/ml   Plan:  Will decrease heparin gtt by 2 units/kg/hr to 850 units/hr and check level in 6 hours.    Vernard Gambles, PharmD, BCPS  09/23/2019,6:09 AM

## 2019-09-23 NOTE — Progress Notes (Signed)
ANTICOAGULATION CONSULT NOTE  Pharmacy Consult for heparin Indication: VTE treatment  Heparin Dosing Weight: 67.1 kg  Labs: Recent Labs    09/22/19 1247 09/22/19 1247 09/23/19 0455 09/23/19 1033  HGB 15.6   < > 13.3 13.0  HCT 46.0  --  42.7 41.5  PLT  --   --  176 163  HEPARINUNFRC  --   --  0.65 0.56  CREATININE 1.10  --  1.16  --    < > = values in this interval not displayed.    Assessment: 83 yom presenting with occluded L femoropopliteal bypass graft, now s/p aortogram with Vascular Surgery and placement of lytic catheter on 8/23. Patient returned to OR on 8/24 s/p lytic therapy overnight for follow-up with stent placement.  Pharmacy consulted to resume heparin 8 hrs post-sheath removal (removed at 2016 per RN chart note). Per discussion with Dr. Myra Gianotti, ok to dose heparin for full goal 0.3-0.7 but start on lower end with no bolus post-op. Patient has completed lytic therapy, and alteplase order d/c'd. CBC wnl, fibrinogen 622 stable this AM. No active bleed issues reported.   Goal of Therapy:  Heparin level 0.3-0.7 units/ml Monitor platelets by anticoagulation protocol: Yes   Plan:  No bolus. Resume heparin at 650 units/hr on 8/25 at 0430 (8hrs post-sheath removal) - conservative starting dose and d/c alteplase order as per discussion with Dr. Myra Gianotti Check 8hr heparin level from start Monitor daily heparin level and CBC, s/sx bleeding   Leia Alf, PharmD, BCPS Please check AMION for all [facility] contact numbers Clinical Pharmacist 09/23/2019 4:55 PM

## 2019-09-23 NOTE — Progress Notes (Signed)
Site area: rt fa sheath pulled by Carylon Perches Site Prior to Removal:  Level 0 Pressure Applied For: 20 minutes Manual:   yes Patient Status During Pull:  stable Post Pull Site:  Level 0 Post Pull Instructions Given:  yes Post Pull Pulses Present: rt dp dopplered Dressing Applied:  Gauze and tegaderm Bedrest begins @ 2030 Comments:

## 2019-09-23 NOTE — Progress Notes (Signed)
  Progress Note ° ° ° °09/23/2019 °11:27 AM °1 Day Post-Op ° °Subjective:  Still having left foot pain ° °Vitals:  ° 09/23/19 0720 09/23/19 0800  °BP:  (!) 102/58  °Pulse:  60  °Resp:  14  °Temp: 98.4 °F (36.9 °C)   °SpO2:  96%  ° ° °Physical Exam: °aaox3 °Right groin soft with sheath in place without hematoma °Left foot is warm °No palpable popliteal pulses °Strong left pt signal ° °CBC °   °Component Value Date/Time  ° WBC 7.8 09/23/2019 1033  ° RBC 4.76 09/23/2019 1033  ° HGB 13.0 09/23/2019 1033  ° HCT 41.5 09/23/2019 1033  ° PLT 163 09/23/2019 1033  ° MCV 87.2 09/23/2019 1033  ° MCH 27.3 09/23/2019 1033  ° MCHC 31.3 09/23/2019 1033  ° RDW 16.4 (H) 09/23/2019 1033  ° ° °BMET °   °Component Value Date/Time  ° NA 134 (L) 09/23/2019 0455  ° K 4.7 09/23/2019 0455  ° CL 102 09/23/2019 0455  ° CO2 24 09/23/2019 0455  ° GLUCOSE 111 (H) 09/23/2019 0455  ° BUN 23 09/23/2019 0455  ° CREATININE 1.16 09/23/2019 0455  ° CALCIUM 9.0 09/23/2019 0455  ° GFRNONAA >60 09/23/2019 0455  ° GFRAA >60 09/23/2019 0455  ° ° °INR °   °Component Value Date/Time  ° INR 0.97 01/17/2018 0950  ° ° ° °Intake/Output Summary (Last 24 hours) at 09/23/2019 1127 °Last data filed at 09/23/2019 0800 °Gross per 24 hour  °Intake 977.43 ml  °Output 1250 ml  °Net -272.57 ml  ° ° ° °Assessment:  68 y.o. male is s/p placement of lytic catheter and occluded left femoral to below-knee popliteal artery bypass graft. ° °Plan: °Recheck lysis today ° °Ludmila Ebarb C. Cayman Brogden, MD °[facility] [place] °Office: [phone] °Pager: [phone] ° °09/23/2019 °11:27 AM ° °

## 2019-09-23 NOTE — H&P (View-Only) (Signed)
°  Progress Note    09/23/2019 11:27 AM 1 Day Post-Op  Subjective:  Still having left foot pain  Vitals:   09/23/19 0720 09/23/19 0800  BP:  (!) 102/58  Pulse:  60  Resp:  14  Temp: 98.4 F (36.9 C)   SpO2:  96%    Physical Exam: aaox3 Right groin soft with sheath in place without hematoma Left foot is warm No palpable popliteal pulses Strong left pt signal  CBC    Component Value Date/Time   WBC 7.8 09/23/2019 1033   RBC 4.76 09/23/2019 1033   HGB 13.0 09/23/2019 1033   HCT 41.5 09/23/2019 1033   PLT 163 09/23/2019 1033   MCV 87.2 09/23/2019 1033   MCH 27.3 09/23/2019 1033   MCHC 31.3 09/23/2019 1033   RDW 16.4 (H) 09/23/2019 1033    BMET    Component Value Date/Time   NA 134 (L) 09/23/2019 0455   K 4.7 09/23/2019 0455   CL 102 09/23/2019 0455   CO2 24 09/23/2019 0455   GLUCOSE 111 (H) 09/23/2019 0455   BUN 23 09/23/2019 0455   CREATININE 1.16 09/23/2019 0455   CALCIUM 9.0 09/23/2019 0455   GFRNONAA >60 09/23/2019 0455   GFRAA >60 09/23/2019 0455    INR    Component Value Date/Time   INR 0.97 01/17/2018 0950     Intake/Output Summary (Last 24 hours) at 09/23/2019 1127 Last data filed at 09/23/2019 0800 Gross per 24 hour  Intake 977.43 ml  Output 1250 ml  Net -272.57 ml     Assessment:  69 y.o. male is s/p placement of lytic catheter and occluded left femoral to below-knee popliteal artery bypass graft.  Plan: Recheck lysis today  Rad Gramling C. Randie Heinz, MD [facility] [place] Office: [phone] Pager: [phone]  09/23/2019 11:27 AM

## 2019-09-23 NOTE — Interval H&P Note (Signed)
History and Physical Interval Note:  09/23/2019 3:41 PM  Ryan [name]  has presented today for surgery, with the diagnosis of lysis recheck.  The various methods of treatment have been discussed with the patient and family. After consideration of risks, benefits and other options for treatment, the patient has consented to  Procedure(s): LYSIS RECHECK (N/A) as a surgical intervention.  The patient's history has been reviewed, patient examined, no change in status, stable for surgery.  I have reviewed the patient's chart and labs.  Questions were answered to the patient's satisfaction.     Durene Cal

## 2019-09-23 NOTE — Progress Notes (Signed)
ANTICOAGULATION CONSULT NOTE  Pharmacy Consult for heparin Indication: VTE treatment  Heparin Dosing Weight: 67.1 kg  Labs: Recent Labs    09/22/19 1247 09/22/19 1247 09/23/19 0455 09/23/19 1033  HGB 15.6   < > 13.3 13.0  HCT 46.0  --  42.7 41.5  PLT  --   --  176 163  HEPARINUNFRC  --   --  0.65 0.56  CREATININE 1.10  --  1.16  --    < > = values in this interval not displayed.    Assessment: 68 yom with history of bilateral SFA stenting and 2 previous bypass grafts placed on the left, presenting with occlusion of bypass graft on left with rest pain and severely decreased ABI. Patient now s/p aortogram with Vascular Surgery and placement of lytic catheter.  Alteplase currently running at 0.5 mg/hr via catheter. Pharmacy consulted to dose heparin post-op targeting level 0.2-0.5. Heparin level still supratherapeutic at 0.56 after decreasing infusion rate to 850 units/hr this morning. CBC wnl, no overt bleeding per discussion with RN. Planning for OR today around noon.  Goal of Therapy:  Heparin level 0.2-0.5 units/ml Monitor platelets by anticoagulation protocol: Yes   Plan:  Decrease heparin to 700 units/hr to target level 0.2-0.5 post-op Continue alteplase 0.5 mg/hr IK per Vascular Check q6h heparin level/CBC/fibrinogen x 4 per protocol Monitor for s/sx bleeding F/u post-OR anticoagulation plans  Tama Headings, PharmD PGY2 Cardiology Pharmacy Resident Phone: [phone] 09/23/2019  11:40 AM  Please check [email or web address] for unit-specific pharmacy phone numbers.

## 2019-09-23 NOTE — Op Note (Signed)
    Patient name: Ryan [name] MRN: [id number] DOB: 05-27-1950 Sex: male  09/23/2019 Pre-operative Diagnosis: Occluded left femoropopliteal bypass graft Post-operative diagnosis:  Same Surgeon:  Durene Cal Procedure Performed:  1.  Follow-up lysis study of left leg  2.  Exchange of lytic catheter and left leg runoff  3.  Stent, left femoral-popliteal bypass graft  4.  Conscious sedation 51 minutes     Indications: The patient presented with an occluded left femoral-popliteal bypass graft with rest pain.  He underwent placement of a lytic catheter yesterday and is here today for follow-up.  Procedure:  The patient was identified in the holding area and taken to room 8.  The patient was then placed supine on the table and prepped and draped in the usual sterile fashion.  A time out was called.  Conscious sedation was administered with the use of IV fentanyl and Versed under continuous physician and nurse monitoring.  Heart rate, blood pressure, and oxygen saturation were continuously monitored.  Total sedation time was 51 minutes.  A V-18 wire was inserted through the lysis catheter which was then removed and left leg runoff was performed.  This showed a widely patent left common femoral and profundofemoral artery.  The femoral-popliteal bypass graft was patent.  There was a small filling defect at the proximal and distal anastomosis.  There was two-vessel runoff via the posterior tibial and peroneal.  Intervention: At this point, I felt the proximal and distal anastomoses warranted intervention.  The patient was rebolused with heparin.  I initially elected to perform balloon angioplasty of the proximal bypass graft with a 6 mm balloon.  This made the luminal irregularity slightly better however there was still residual irregularity which could potentially have represented old thrombus and so I felt this needed to be stented.  A 7 x 40 Elluvia stent was placed and postdilated with a 6 mm balloon.   Follow-up study showed no significant stenosis.  I then treated the distal anastomosis with a 5 x 60 Tigris stent which was postdilated with a 4 mm balloon.  No further filling defects were visualized on completion imaging.  The long sheath was exchanged out for a short 6 French sheath and the patient was taken the holding area for recovery.  Impression:  #1  Patent femoral-popliteal bypass graft following overnight lytic therapy.  #2  Luminal irregularity within the proximal and distal anastomoses.  The proximal anastomosis was stented with a 7 x 40 Elluvia, and the distal anastomosis was treated with a 5 x 60 Tigris.  #3  Two-vessel runoff via the posterior tibial and peroneal artery   V. Durene Cal, M.D., [facility] [place] Office: [phone] Pager:  [phone]

## 2019-09-24 ENCOUNTER — Encounter (HOSPITAL_COMMUNITY): Payer: Self-pay | Admitting: Surgery

## 2019-09-24 LAB — CBC
HCT: 37.9 % — ABNORMAL LOW (ref 39.0–52.0)
Hemoglobin: 11.9 g/dL — ABNORMAL LOW (ref 13.0–17.0)
MCH: 27.4 pg (ref 26.0–34.0)
MCHC: 31.4 g/dL (ref 30.0–36.0)
MCV: 87.1 fL (ref 80.0–100.0)
Platelets: 150 10*3/uL (ref 150–400)
RBC: 4.35 MIL/uL (ref 4.22–5.81)
RDW: 15.9 % — ABNORMAL HIGH (ref 11.5–15.5)
WBC: 5.4 10*3/uL (ref 4.0–10.5)
nRBC: 0 % (ref 0.0–0.2)

## 2019-09-24 MED ORDER — RIVAROXABAN 2.5 MG PO TABS
2.5000 mg | ORAL_TABLET | Freq: Two times a day (BID) | ORAL | Status: DC
  Filled 2019-09-24: qty 1

## 2019-09-24 MED ORDER — RIVAROXABAN 2.5 MG PO TABS: 2.5000 mg | ORAL_TABLET | Freq: Two times a day (BID) | ORAL | 3 refills | Status: DC

## 2019-09-24 MED ORDER — OXYCODONE-ACETAMINOPHEN 5-325 MG PO TABS: 1.0000 | ORAL_TABLET | Freq: Four times a day (QID) | ORAL | 0 refills | Status: DC | PRN

## 2019-09-24 MED ORDER — ROSUVASTATIN CALCIUM 10 MG PO TABS
10.0000 mg | ORAL_TABLET | Freq: Every day | ORAL | 3 refills | Status: DC
Start: 2019-09-24 — End: 2020-06-18

## 2019-09-24 MED ORDER — APIXABAN 2.5 MG PO TABS: 2.5000 mg | ORAL_TABLET | Freq: Every day | ORAL | 0 refills | Status: DC

## 2019-09-24 MED ORDER — APIXABAN 2.5 MG PO TABS: 2.5000 mg | ORAL_TABLET | Freq: Two times a day (BID) | ORAL | 0 refills | Status: DC

## 2019-09-24 MED ORDER — APIXABAN 2.5 MG PO TABS
2.5000 mg | ORAL_TABLET | Freq: Two times a day (BID) | ORAL | Status: DC
  Administered 2019-09-24: 2.5 mg via ORAL
  Filled 2019-09-24: qty 1

## 2019-09-24 MED FILL — Clopidogrel Bisulfate Tab 75 MG (Base Equiv): ORAL | Qty: 1 | Status: AC

## 2019-09-24 MED FILL — ROSUVASTATIN CALCIUM 10 MG: 10 | 30 days supply | Qty: 30 | Fill #0

## 2019-09-24 MED FILL — OXYCODONE-APAP 5-325MG: 5-325 | 3 days supply | Qty: 12 | Fill #0

## 2019-09-24 MED FILL — ELIQUIS 2.5 MG TABLET: 2.5 | 30 days supply | Qty: 60 | Fill #0

## 2019-09-24 NOTE — Evaluation (Signed)
Physical Therapy Evaluation Patient Details Name: Ryan [name] MRN: [id number] DOB: 11-30-1950 Today's Date: 09/24/2019   History of Present Illness  69 y.o. male is s/p placement of lytic catheter and occluded left femoral to below-knee popliteal artery bypass graft.  Clinical Impression  Patient received sitting on edge of bed, dressed for discharge but reports pain in left foot and numbness. Patient is unable to get shoe on or walk. Patient is able to stand with min guard. Provided him with walker, patient is able to ambulate 150 feet with rw and min guard. Improved pain relief and mobility using RW and verbalized that he feels like he will be able to manage at home if he has a walker. Relayed this information to his RN who states patient will get walker prior to discharge.  Patient to be discharged once rw is provided.     Follow Up Recommendations Home health PT;Supervision - Intermittent    Equipment Recommendations  Rolling walker with 5" wheels    Recommendations for Other Services       Precautions / Restrictions Precautions Precautions: Fall Precaution Comments: mod fall Restrictions Weight Bearing Restrictions: No      Mobility  Bed Mobility               General bed mobility comments: patient received sitting up on side of bed, dressed.  Transfers Overall transfer level: Needs assistance Equipment used: Rolling walker (2 wheeled) Transfers: Sit to/from Stand Sit to Stand: Min guard            Ambulation/Gait Ambulation/Gait assistance: Min guard Gait Distance (Feet): 150 Feet Assistive device: Rolling walker (2 wheeled) Gait Pattern/deviations: Step-to pattern;Decreased weight shift to left Gait velocity: decreased   General Gait Details: patient reports 12/10 pain in left foot, requires min guard with RW  Stairs            Wheelchair Mobility    Modified Rankin (Stroke Patients Only)       Balance Overall balance assessment:  Modified Independent                                           Pertinent Vitals/Pain Pain Assessment: 0-10 Pain Score: 10-Worst pain ever Pain Location: L foot Pain Descriptors / Indicators: Aching;Grimacing;Guarding;Sore;Numbness Pain Intervention(s): Limited activity within patient's tolerance;Monitored during session    Home Living Family/patient expects to be discharged to:: Private residence Living Arrangements: Spouse/significant other Available Help at Discharge: Family;Available 24 hours/day Type of Home: House Home Access: Level entry     Home Layout: One level Home Equipment: Cane - single point      Prior Function Level of Independence: Independent               Hand Dominance        Extremity/Trunk Assessment   Upper Extremity Assessment Upper Extremity Assessment: Overall WFL for tasks assessed    Lower Extremity Assessment Lower Extremity Assessment: LLE deficits/detail LLE: Unable to fully assess due to pain LLE Coordination: decreased gross motor    Cervical / Trunk Assessment Cervical / Trunk Assessment: Normal  Communication   Communication: No difficulties  Cognition Arousal/Alertness: Awake/alert Behavior During Therapy: WFL for tasks assessed/performed Overall Cognitive Status: Within Functional Limits for tasks assessed  General Comments General comments (skin integrity, edema, etc.): left foot tender to touch    Exercises     Assessment/Plan    PT Assessment Patient needs continued PT services  PT Problem List Decreased mobility;Decreased activity tolerance;Pain       PT Treatment Interventions DME instruction;Gait training;Functional mobility training;Therapeutic exercise;Patient/family education    PT Goals (Current goals can be found in the Care Plan section)  Acute Rehab PT Goals Patient Stated Goal: to decrease pain PT Goal Formulation: With  patient Time For Goal Achievement: 09/27/19 Potential to Achieve Goals: Good    Frequency Min 3X/week   Barriers to discharge        Co-evaluation               AM-PAC PT "6 Clicks" Mobility  Outcome Measure Help needed turning from your back to your side while in a flat bed without using bedrails?: None Help needed moving from lying on your back to sitting on the side of a flat bed without using bedrails?: None Help needed moving to and from a bed to a chair (including a wheelchair)?: A Little Help needed standing up from a chair using your arms (e.g., wheelchair or bedside chair)?: A Little Help needed to walk in hospital room?: A Little Help needed climbing 3-5 steps with a railing? : A Little 6 Click Score: 20    End of Session Equipment Utilized During Treatment: Gait belt Activity Tolerance: Patient limited by pain Patient left: in bed;with call bell/phone within reach Nurse Communication: Mobility status;Other (comment) (need for walker) PT Visit Diagnosis: Pain Pain - Right/Left: Left Pain - part of body: Ankle and joints of foot    Time: 1210-1230 PT Time Calculation (min) (ACUTE ONLY): 20 min   Charges:   PT Evaluation $PT Eval Moderate Complexity: 1 Mod PT Treatments $Gait Training: 8-22 mins        Spike Desilets, PT, GCS 09/24/19,2:22 PM

## 2019-09-24 NOTE — Care Management (Signed)
09-24-19 1210 Late Entry: Case Manager spoke with patient and he feels that he is having difficulty walking with the numbness in his foot. Case Manager asked staff to get a PT consult for imminent discharge. PT was able to see the patient and the recommendation is for a rolling walker. PT states he moved well with the assistance of the RW. Case Manager discussed this with the patient and he is agreeable to a RW to be delivered via [employer]. [employer] is aware to deliver to [facility]. Nursing staff is aware to call wife once the RW has been delivered. No further needs from Case Manager at this time. Gala Lewandowsky, RN,BSN Case Manager

## 2019-09-24 NOTE — Progress Notes (Addendum)
[facility] [place]  Subjective  - Left leg feels better, still pain in the toes to touch.   Objective 131/62 66 (!) 97.5 F (36.4 C) (Oral) 15 96%  Intake/Output Summary (Last 24 hours) at 09/24/2019 0716 Last data filed at 09/24/2019 0543 Gross per 24 hour  Intake 3371.45 ml  Output 2975 ml  Net 396.45 ml    Palpable DP pulse left LE, foot warm to touch and sensation to light touch intact. Right groin soft without hematoma. Heart RRR Lungs non labored breathing  Assessment/Planning: POD # 1 & 2    #1  Patent femoral-popliteal bypass graft following overnight lytic therapy.             #2  Luminal irregularity within the proximal and distal anastomoses.  The proximal anastomosis was stented with a 7 x 40 Elluvia, and the distal anastomosis was treated with a 5 x 60 Tigris.             #3  Two-vessel runoff via the posterior tibial and peroneal artery  Palpable pedal pulse.  Patent bypass.  Plan to discharge on Plavix 75 mg daily and 81 mg ASA.  He is on Heparin currently I will  discharge him on Xarelto.   Mosetta Pigeon 09/24/2019 7:16 AM --  Laboratory Lab Results: Recent Labs    09/23/19 0455 09/23/19 1033  WBC 7.6 7.8  HGB 13.3 13.0  HCT 42.7 41.5  PLT 176 163   BMET Recent Labs    09/22/19 1247 09/23/19 0455  NA 137 134*  K 5.0 4.7  CL 106 102  CO2  --  24  GLUCOSE 89 111*  BUN 49* 23  CREATININE 1.10 1.16  CALCIUM  --  9.0    COAG Lab Results  Component Value Date   INR 0.97 01/17/2018   INR 0.9 06/16/2008   INR 1.9 (H) 12/31/2007   No results found for: PTT  I have independently interviewed and examined patient and agree with PA assessment and plan above.   We will set him up with oral anticoagulation with eliquis for 30 days as well as Plavix.  He will follow-up in 4 to 6 weeks with left lower extremity duplex and ABIs.  Will likely only need eliquis for 30 days total if duplex is satisfactory.  He will  follow up with either the PA or Dr. Chestine Spore.  Marlee Trentman C. Randie Heinz, MD [facility] [place] Office: [phone] Pager: [phone]

## 2019-09-24 NOTE — Progress Notes (Addendum)
ANTICOAGULATION CONSULT NOTE  Pharmacy Consult for heparin Indication: VTE treatment  Heparin Dosing Weight: 67.1 kg  Labs: Recent Labs    09/22/19 1247 09/22/19 1247 09/23/19 0455 09/23/19 1033  HGB 15.6   < > 13.3 13.0  HCT 46.0  --  42.7 41.5  PLT  --   --  176 163  HEPARINUNFRC  --   --  0.65 0.56  CREATININE 1.10  --  1.16  --    < > = values in this interval not displayed.    Assessment: 34 yom presenting with occluded L femoropopliteal bypass graft, now s/p aortogram with Vascular Surgery and placement of lytic catheter on 8/23. Patient returned to OR on 8/24 s/p lytic therapy overnight for follow-up with stent placement.  Pharmacy consulted to transition to Xarelto. Per VVS, planning for 2.5mg  BID dosing.   Goal of Therapy:  Heparin level 0.3-0.7 units/ml Monitor platelets by anticoagulation protocol: Yes   Plan:  Stop heparin Xarelto 2.5mg  BID  ADDENDUM: switching to apixaban for cost reasons per vascular. 2.5mg  BID x1 month   Fredonia Highland, PharmD, BCPS Clinical Pharmacist [phone] Please check AMION for all [facility] Pharmacy numbers 09/24/2019

## 2019-09-24 NOTE — Care Management (Signed)
1019 09-24-19 Patient will transition home on Eliquis. 30 day free to be issued via [facility]. No further needs from Case Manager at this time. Graves-Bigelow, Lamar Laundry, RN, BSN Case Manager

## 2019-09-24 NOTE — Discharge Instructions (Signed)
Femoral Site Care This sheet gives you information about how to care for yourself after your procedure. Your health care provider may also give you more specific instructions. If you have problems or questions, contact your health care provider. What can I expect after the procedure? After the procedure, it is common to have:  Bruising that usually fades within 1-2 weeks.  Tenderness at the site. Follow these instructions at home: Wound care  Follow instructions from your health care provider about how to take care of your insertion site. Make sure you: ? Wash your hands with soap and water before you change your bandage (dressing). If soap and water are not available, use hand sanitizer. ? Change your dressing as told by your health care provider. ? Leave stitches (sutures), skin glue, or adhesive strips in place. These skin closures may need to stay in place for 2 weeks or longer. If adhesive strip edges start to loosen and curl up, you may trim the loose edges. Do not remove adhesive strips completely unless your health care provider tells you to do that.  Do not take baths, swim, or use a hot tub until your health care provider approves.  You may shower 24-48 hours after the procedure or as told by your health care provider. ? Gently wash the site with plain soap and water. ? Pat the area dry with a clean towel. ? Do not rub the site. This may cause bleeding.  Do not apply powder or lotion to the site. Keep the site clean and dry.  Check your femoral site every day for signs of infection. Check for: ? Redness, swelling, or pain. ? Fluid or blood. ? Warmth. ? Pus or a bad smell. Activity  For the first 2-3 days after your procedure, or as long as directed: ? Avoid climbing stairs as much as possible. ? Do not squat.  Do not lift anything that is heavier than 10 lb (4.5 kg), or the limit that you are told, until your health care provider says that it is safe.  Rest as  directed. ? Avoid sitting for a long time without moving. Get up to take short walks every 1-2 hours.  Do not drive for 24 hours if you were given a medicine to help you relax (sedative). General instructions  Take over-the-counter and prescription medicines only as told by your health care provider.  Keep all follow-up visits as told by your health care provider. This is important. Contact a health care provider if you have:  A fever or chills.  You have redness, swelling, or pain around your insertion site. Get help right away if:  The catheter insertion area swells very fast.  You pass out.  You suddenly start to sweat or your skin gets clammy.  The catheter insertion area is bleeding, and the bleeding does not stop when you hold steady pressure on the area.  The area near or just beyond the catheter insertion site becomes pale, cool, tingly, or numb. These symptoms may represent a serious problem that is an emergency. Do not wait to see if the symptoms will go away. Get medical help right away. Call your local emergency services (911 in the [place] Do not drive yourself to the hospital. Summary  After the procedure, it is common to have bruising that usually fades within 1-2 weeks.  Check your femoral site every day for signs of infection.  Do not lift anything that is heavier than 10 lb (4.5 kg), or the   limit that you are told, until your health care provider says that it is safe. This information is not intended to replace advice given to you by your health care provider. Make sure you discuss any questions you have with your health care provider. Document Revised: 01/29/2017 Document Reviewed: 01/29/2017 Elsevier Patient Education  2020 Elsevier Inc.  

## 2019-09-24 NOTE — Progress Notes (Signed)
At time of discharge made made staff aware that he could not care for himself after discharge related to mobility.  Involved TOC RN Steward Drone to help evalate discharge needs.  Made request of PT to evaluate patient.    Jacqulyn Cane RN, BSN, CCRN

## 2019-09-26 NOTE — Discharge Summary (Signed)
[facility] Discharge Summary   Patient ID:  Ryan [name] MRN: [id number] DOB/AGE: April 29, 1950 69 y.o.  Admit date: 09/22/2019 Discharge date: 09/24/19 Date of Surgery: 09/23/2019 Surgeon: Surgeon(s): Nada Libman, MD  Admission Diagnosis: PAD (peripheral artery disease) [facility]) [I73.9]  Discharge Diagnoses:  PAD (peripheral artery disease) (HCC) [I73.9]  Secondary Diagnoses: Past Medical History:  Diagnosis Date  . Hypertension   . PVD (peripheral vascular disease) (HCC)     Procedure(s): LYSIS RECHECK PERIPHERAL VASCULAR INTERVENTION  Discharged Condition: stable  HPI: Ryan [name] is a 69 y.o. ([birth year]) male who presents for follow up of peripheral arterial disease. He has history of left common femoral artery to below knee popliteal bypass graft with PTFE and left profundofemoral endarterectomy on 12/14/16 by Dr. Imogene Burn. He had to subsequently have a redo left femoral to below knee popliteal bypass with PTFE by Dr. Chestine Spore on 01/28/18.  He presents for earlier follow up today due to acute onset of left lower extremity pain that started on 08/27/19. He and his wife said that they tried to follow up on the day it occurred and also prior to today's visit with some difficulty. The patient explains that he was hanging some cabinets and was up and down a ladder when all the sudden he had sharp pain shoot down his leg and into his left foot. He says that since then the forefoot constantly hurts and is worsened by ambulation.  On ABI's the bypass was occluded.   Hospital Course:  Ryan [name] is a 69 y.o. male is S/P  Procedure(s): LYSIS RECHECK PERIPHERAL VASCULAR INTERVENTION followed by re check of the bypass and Stent The proximal anastomosis was stented with a 7 x 40 Elluvia, and the distal anastomosis was treated with a 5 x 60 Tigris. , left femoral-popliteal bypass graft by Dr. Myra Gianotti 09/23/19  Palpable DP pulse left LE, foot warm to touch and sensation to  light touch intact. He was  discharge on Plavix 75 mg daily and 81 mg ASA and Eliquis for 30 days.  F/U with PA or Dr. Chestine Spore 4-6 weeks.        Significant Diagnostic Studies: CBC Lab Results  Component Value Date   WBC 5.4 09/24/2019   HGB 11.9 (L) 09/24/2019   HCT 37.9 (L) 09/24/2019   MCV 87.1 09/24/2019   PLT 150 09/24/2019    BMET    Component Value Date/Time   NA 134 (L) 09/23/2019 0455   K 4.7 09/23/2019 0455   CL 102 09/23/2019 0455   CO2 24 09/23/2019 0455   GLUCOSE 111 (H) 09/23/2019 0455   BUN 23 09/23/2019 0455   CREATININE 1.16 09/23/2019 0455   CALCIUM 9.0 09/23/2019 0455   GFRNONAA >60 09/23/2019 0455   GFRAA >60 09/23/2019 0455   COAG Lab Results  Component Value Date   INR 0.97 01/17/2018   INR 0.9 06/16/2008   INR 1.9 (H) 12/31/2007     Disposition:  Discharge to :Home Discharge Instructions    Call MD for:  redness, tenderness, or signs of infection (pain, swelling, bleeding, redness, odor or green/yellow discharge around incision site)   Complete by: As directed    Call MD for:  severe or increased pain, loss or decreased feeling  in affected limb(s)   Complete by: As directed    Call MD for:  temperature >100.5   Complete by: As directed    Resume previous diet   Complete by: As directed      Allergies  as of 09/24/2019   No Known Allergies     Medication List    TAKE these medications   apixaban 2.5 MG Tabs tablet Commonly known as: Eliquis Take 1 tablet (2.5 mg total) by mouth 2 (two) times daily.   aspirin EC 81 MG tablet Take 81 mg by mouth daily. Swallow whole.   atenolol 50 MG tablet Commonly known as: TENORMIN Take 50 mg by mouth daily.   clopidogrel 75 MG tablet Commonly known as: Plavix Take 1 tablet (75 mg total) by mouth daily.   feeding supplement (ENSURE ENLIVE) Liqd Take 237 mLs 2 (two) times daily between meals by mouth.   losartan-hydrochlorothiazide 100-25 MG tablet Commonly known as: HYZAAR Take 1  tablet by mouth daily.   oxyCODONE-acetaminophen 5-325 MG tablet Commonly known as: Percocet Take 1 tablet by mouth every 6 (six) hours as needed for severe pain. What changed: when to take this   rosuvastatin 10 MG tablet Commonly known as: CRESTOR Take 1 tablet (10 mg total) by mouth daily. What changed:   medication strength  how much to take      Verbal and written Discharge instructions given to the patient. Wound care per Discharge AVS  Follow-up Information    Cephus Shelling, MD Follow up in 2 week(s).   Specialty: Vascular Surgery Why: office will call Contact information: [address] [place] [address] [phone]               Signed: Mosetta Pigeon 09/26/2019, 2:07 PM

## 2019-09-29 ENCOUNTER — Other Ambulatory Visit: Payer: Self-pay | Admitting: *Deleted

## 2019-09-29 ENCOUNTER — Encounter: Payer: Self-pay | Admitting: *Deleted

## 2019-09-29 NOTE — Patient Outreach (Signed)
[facility]) Care Management  09/29/2019  Ryan [name] 1950/05/02 [id number]  Telephone outreach for RED FLAG on EMMI DISCHARGE CALL. Left message for [name] to return my call.  [name] called me back at 6:15 am. He reports he does have a follow up appt scheduled with his vascular surgeon for diagnostics to verify the success of his procedure.  We discussed his tobacco use and it being the culprit that causes PVD in many smokers. He reports he has cut back but not quit. He has tried all means to quit but has not been able to. NP encouraged him to find a reason. NP suggested, avoiding future complications with his legs including amputation. That seemed to pick up his interest. Also suggested he pick a stop date sometime in the next month.  He said he would consider working with me for care management. He requests that I send him information and call him back in 2 weeks.  [name]. [name], MSN, [facility] Gerontological Nurse Practitioner [facility] Care Management [phone]

## 2019-10-01 ENCOUNTER — Other Ambulatory Visit: Payer: Self-pay

## 2019-10-01 DIAGNOSIS — T82898A Other specified complication of vascular prosthetic devices, implants and grafts, initial encounter: Secondary | ICD-10-CM

## 2019-10-01 DIAGNOSIS — I779 Disorder of arteries and arterioles, unspecified: Secondary | ICD-10-CM

## 2019-10-14 ENCOUNTER — Ambulatory Visit (INDEPENDENT_AMBULATORY_CARE_PROVIDER_SITE_OTHER): Payer: Medicare Other | Admitting: Physician Assistant

## 2019-10-14 ENCOUNTER — Other Ambulatory Visit: Payer: Self-pay

## 2019-10-14 ENCOUNTER — Ambulatory Visit (INDEPENDENT_AMBULATORY_CARE_PROVIDER_SITE_OTHER)
Admission: RE | Admit: 2019-10-14 | Discharge: 2019-10-14 | Disposition: A | Discharge status: Home / Self Care | Payer: Medicare Other | Source: Ambulatory Visit | Attending: Vascular Surgery | Admitting: Vascular Surgery

## 2019-10-14 ENCOUNTER — Ambulatory Visit (HOSPITAL_COMMUNITY)
Admission: RE | Admit: 2019-10-14 | Discharge: 2019-10-14 | Disposition: A | Discharge status: Home / Self Care | Payer: Medicare Other | Source: Ambulatory Visit | Attending: Vascular Surgery | Admitting: Vascular Surgery

## 2019-10-14 VITALS — BP 127/69 | HR 58 | Temp 97.5°F | Resp 20 | Ht 66.0 in | Wt 141.5 lb

## 2019-10-14 DIAGNOSIS — T82898A Other specified complication of vascular prosthetic devices, implants and grafts, initial encounter: Secondary | ICD-10-CM | POA: Insufficient documentation

## 2019-10-14 DIAGNOSIS — I779 Disorder of arteries and arterioles, unspecified: Secondary | ICD-10-CM | POA: Insufficient documentation

## 2019-10-14 NOTE — Progress Notes (Signed)
[facility] [place] HISTORY AND PHYSICAL   History of Present Illness: Ryan Thomasis a 69 y.o.([birth year])malewho presents for follow up of peripheral arterial disease. He has history of left common femoral artery to below knee popliteal bypass graft with PTFE and left profundofemoral endarterectomy on 12/14/16 by Dr. Imogene Burn. He had to subsequently have a redo left femoral to below knee popliteal bypass with PTFE by Dr. Chestine Spore on 01/28/18.  On his last visit 09/24/19 he underwent lysis his bypass.  He is here today for f/u.  He denise rest pain, loss of sensation and motor in the left LE.   He denise pain and non healing wounds.  He walks a lot on a daily basis.  Past Medical History:  Diagnosis Date  . Hypertension   . PVD (peripheral vascular disease) (HCC)     Past Surgical History:  Procedure Laterality Date  . ABDOMINAL AORTAGRAM Left 12/13/2016   ABDOMINAL AORTOGRAM W/LOWER EXTREMITY/notes 12/13/2016  . ABDOMINAL AORTOGRAM W/LOWER EXTREMITY N/A 11/28/2016   Procedure: ABDOMINAL AORTOGRAM W/LOWER EXTREMITY;  Surgeon: Yates Decamp, MD;  [facility];  Service: Cardiovascular;  Laterality: N/A;  bilateral  . ABDOMINAL AORTOGRAM W/LOWER EXTREMITY N/A 12/13/2016   Procedure: ABDOMINAL AORTOGRAM W/LOWER EXTREMITY;  Surgeon: Maeola Harman, MD;  Location: [facility] INVASIVE CV LAB;  Service: Cardiovascular;  Laterality: N/A;  unilater left leg  . ABDOMINAL AORTOGRAM W/LOWER EXTREMITY N/A 01/09/2018   Procedure: ABDOMINAL AORTOGRAM W/LOWER EXTREMITY;  Surgeon: Cephus Shelling, MD;  [facility];  Service: Cardiovascular;  Laterality: N/A;  . ABDOMINAL AORTOGRAM W/LOWER EXTREMITY Left 09/22/2019   Procedure: ABDOMINAL AORTOGRAM W/LOWER EXTREMITY;  Surgeon: Maeola Harman, MD;  Location: [facility] INVASIVE CV LAB;  Service: Cardiovascular;  Laterality: Left;  . FEMORAL-POPLITEAL BYPASS GRAFT Left 12/14/2016   Procedure: BYPASS GRAFT  COMMON FEMORAL- BELOW KNEE POPLITEAL ARTERY with Bovine Patch to Below the knee poplitieal artery.;  Surgeon: Fransisco Hertz, MD;  Location: [facility] OR;  Service: Vascular;  Laterality: Left;  . FEMORAL-POPLITEAL BYPASS GRAFT Left 01/28/2018   Procedure: BYPASS GRAFT FEMORAL-BELOW KNEE POPLITEAL ARTERY REDO with propaten vascular graft removable ring;  Surgeon: Cephus Shelling, MD;  Location: [facility] OR;  Service: Vascular;  Laterality: Left;  . FEMUR SURGERY Right 2009   pt. has a rod in that leg  . LOWER EXTREMITY ANGIOGRAPHY Left 11/28/2016   Procedure: Lower Extremity Angiography;  Surgeon: Yates Decamp, MD;  Location: [facility] INVASIVE CV LAB;  Service: Cardiovascular;  Laterality: Left;  lysis followup lower leg  . PATCH ANGIOPLASTY Left 01/28/2018   Procedure: PATCH ANGIOPLASTY USING Livia Snellen BIOLOGIC PATCH;  Surgeon: Cephus Shelling, MD;  Location: [facility] OR;  Service: Vascular;  Laterality: Left;  . PERIPHERAL VASCULAR BALLOON ANGIOPLASTY  11/28/2016   Procedure: PERIPHERAL VASCULAR BALLOON ANGIOPLASTY;  Surgeon: Yates Decamp, MD;  [facility];  Service: Cardiovascular;;  SFA  . PERIPHERAL VASCULAR CATHETERIZATION N/A 08/11/2014   Procedure: Lower Extremity Angiography;  Surgeon: Yates Decamp, MD;  Location: [facility] INVASIVE CV LAB;  Service: Cardiovascular;  Laterality: N/A;  . PERIPHERAL VASCULAR INTERVENTION Left 09/22/2019   Procedure: PERIPHERAL VASCULAR INTERVENTION;  Surgeon: Maeola Harman, MD;  Location: [facility] INVASIVE CV LAB;  Service: Cardiovascular;  Laterality: Left;  . PERIPHERAL VASCULAR INTERVENTION Left 09/23/2019   Procedure: PERIPHERAL VASCULAR INTERVENTION;  Surgeon: Nada Libman, MD;  [facility];  Service: Cardiovascular;  Laterality: Left;  . PERIPHERAL VASCULAR THROMBECTOMY  11/28/2016   Procedure:  PERIPHERAL VASCULAR THROMBECTOMY;  Surgeon: Yates Decamp, MD;  Location: [facility] INVASIVE CV LAB;  Service: Cardiovascular;;  Profunda  . PERIPHERAL  VASCULAR THROMBECTOMY  11/28/2016   Procedure: PERIPHERAL VASCULAR THROMBECTOMY;  Surgeon: Yates Decamp, MD;  [facility];  Service: Cardiovascular;;  . PULMONARY THROMBECTOMY N/A 09/23/2019   Procedure: LYSIS RECHECK;  Surgeon: Nada Libman, MD;  [facility];  Service: Cardiovascular;  Laterality: N/A;  . THROMBECTOMY FEMORAL ARTERY Left 12/14/2016   Procedure: THROMBECTOMY PROFUNDA FEMORAL ARTERY;  Surgeon: Fransisco Hertz, MD;  Location: [facility] OR;  Service: Vascular;  Laterality: Left;    ROS:   General:  No weight loss, Fever, chills  HEENT: No recent headaches, no nasal bleeding, no visual changes, no sore throat  Neurologic: No dizziness, blackouts, seizures. No recent symptoms of stroke or mini- stroke. No recent episodes of slurred speech, or temporary blindness.  Cardiac: No recent episodes of chest pain/pressure, no shortness of breath at rest.  No shortness of breath with exertion.  Denies history of atrial fibrillation or irregular heartbeat  Vascular: No history of rest pain in feet.  No history of claudication.  No history of non-healing ulcer, No history of DVT   Pulmonary: No home oxygen, no productive cough, no hemoptysis,  No asthma or wheezing  Musculoskeletal:  [ ]  Arthritis, [ ]  Low back pain,  [ ]  Joint pain  Hematologic:No history of hypercoagulable state.  No history of easy bleeding.  No history of anemia  Gastrointestinal: No hematochezia or melena,  No gastroesophageal reflux, no trouble swallowing  Urinary: [ ]  chronic Kidney disease, [ ]  on HD - [ ]  MWF or [ ]  TTHS, [ ]  Burning with urination, [ ]  Frequent urination, [ ]  Difficulty urinating;   Skin: No rashes  Psychological: No history of anxiety,  No history of depression  Social History Social History   Tobacco Use  . Smoking status: Current Every Day Smoker    Packs/day: 0.10    Years: 56.00    Pack years: 5.60    Types: Cigarettes  . Smokeless tobacco: Never  Used  Vaping Use  . Vaping Use: Never used  Substance Use Topics  . Alcohol use: Yes    Alcohol/week: 10.0 standard drinks    Types: 10 Shots of liquor per week    Comment:  01/29/2018 "1-2 shots per night"  . Drug use: No    Family History Family History  Problem Relation Age of Onset  . Hypertension Mother   . Diabetes Mother   . Hypertension Father     Allergies  No Known Allergies   Current Outpatient Medications  Medication Sig Dispense Refill  . apixaban (ELIQUIS) 2.5 MG TABS tablet Take 1 tablet (2.5 mg total) by mouth 2 (two) times daily. 60 tablet 0  . aspirin EC 81 MG tablet Take 81 mg by mouth daily. Swallow whole.    atenolol (TENORMIN) 50 MG tablet Take 50 mg by mouth daily.    . clopidogrel (PLAVIX) 75 MG tablet Take 1 tablet (75 mg total) by mouth daily. 30 tablet 6  . losartan-hydrochlorothiazide (HYZAAR) 100-25 MG tablet Take 1 tablet by mouth daily.     oxyCODONE-acetaminophen (PERCOCET) 5-325 MG tablet Take 1 tablet by mouth every 6 (six) hours as needed for severe pain. 12 tablet 0  . rosuvastatin (CRESTOR) 10 MG tablet Take 1 tablet (10 mg total) by mouth daily. 30 tablet 3   No current facility-administered medications  for this visit.    Physical Examination  Vitals:   10/14/19 0924  BP: 127/69  Pulse: (!) 58  Resp: 20  Temp: (!) 97.5 F (36.4 C)  TempSrc: Temporal  SpO2: 97%  Weight: 141 lb 8 oz (64.2 kg)  Height: 5\' 6"  (1.676 m)    Body mass index is 22.84 kg/m.  General:  Alert and oriented, no acute distress HEENT: Normal Neck: No bruit or JVD Pulmonary: Clear to auscultation bilaterally Cardiac: Regular Rate and Rhythm without murmur Abdomen: Soft, non-tender, non-distended, no mass, no scars Skin: No rash Extremity Pulses:  2+ radial, brachial, femoral, dorsalis pedis,pulses bilaterally Musculoskeletal: No deformity with minimal left LE edema edema  Neurologic: Upper and lower extremity motor 5/5 and symmetric  DATA:      Left Proximal stent:  +---------------+--++---------++  Prox to Stent 81biphasic   +---------------+--++---------++  Proximal Stent 57biphasic   +---------------+--++---------++  Mid Stent   53biphasic   +---------------+--++---------++  Distal Stent  54triphasic  +---------------+--++---------++  Distal to Stent61biphasic   +---------------+--++---------++             Left Distal Stent:  +---------------+--++--------++  Prox to Stent 66biphasic  +---------------+--++--------++  Proximal Stent 65biphasic  +---------------+--++--------++  Mid Stent   60biphasic  +---------------+--++--------++  Distal Stent  65biphasic  +---------------+--++--------++  Distal to Stent84biphasic  +---------------+--++--------++          Left Graft #1: +--------------------+--------+--------+--------+---------+            PSV cm/sStenosisWaveformComments   +--------------------+--------+--------+--------+---------+  Inflow       106       biphasic       +--------------------+--------+--------+--------+---------+  Proximal Anastomosis81       biphasic       +--------------------+--------+--------+--------+---------+  Proximal Graft               stent    +--------------------+--------+--------+--------+---------+  Mid Graft      48       biphasic       +--------------------+--------+--------+--------+---------+  Distal Graft                2nd stent  +--------------------+--------+--------+--------+---------+  Distal Anastomosis             2nd stent  +--------------------+--------+--------+--------+---------+  Outflow       84       biphasic       +--------------------+--------+--------+--------+---------+          Summary:   Left: Patent FPBPG and stents with no evidence for restenosis.   ABI Findings:  +---------+------------------+-----+----------+--------+  Right  Rt Pressure (mmHg)IndexWaveform Comment   +---------+------------------+-----+----------+--------+  Brachial 119                      +---------+------------------+-----+----------+--------+  PTA   41        0.34 monophasic      +---------+------------------+-----+----------+--------+  DP    57        0.48 monophasic      +---------+------------------+-----+----------+--------+  Great Toe61        0.51 Abnormal       +---------+------------------+-----+----------+--------+   +---------+------------------+-----+---------+-------+  Left   Lt Pressure (mmHg)IndexWaveform Comment  +---------+------------------+-----+---------+-------+  Brachial 120                      +---------+------------------+-----+---------+-------+  PTA   140        1.17 triphasic      +---------+------------------+-----+---------+-------+  DP    114  0.95 biphasic       +---------+------------------+-----+---------+-------+  Great Toe92        0.77 Normal        +---------+------------------+-----+---------+-------+   +-------+-----------+-----------+------------+------------+  ABI/TBIToday's ABIToday's TBIPrevious ABIPrevious TBI  +-------+-----------+-----------+------------+------------+  Right 0.48    0.51    0.66    0.52      +-------+-----------+-----------+------------+------------+  Left  1.17    0.77    0.3     0        +-------+-----------+-----------+------------+------------+    ASSESSMENT:    left LE ischemia S/p  #1 Patent femoral-popliteal bypass graft following overnight lytic therapy. #2 Luminal  irregularity within the proximal and distal anastomoses. The proximal anastomosis was stented with a 7 x 40 Elluvia, and the distal anastomosis was treated with a 5 x 60 Tigris. #3 Two-vessel runoff via the posterior tibial and peroneal artery             Palpable pedal pulse.  Patent bypass.  Plan to discharge on Plavix 75 mg daily and 81 mg ASA.  He is on Heparin currently I will  discharge him on Xarelto.  PLAN:   He is asymptomatic and able to ambulate as much as he wants.He is a smoker and I advised hi to quite if possible to maintain an open bypass.    F/U in 6 months for repeat bypass duplex and ABIs.  If he has symptoms of ischemia with rest pain or non healing wounds he will call sooner.   Mosetta Pigeon PA-C [facility] [place] Office: [phone]  MD in clinic [place]

## 2019-10-15 ENCOUNTER — Other Ambulatory Visit: Payer: Self-pay | Admitting: *Deleted

## 2019-10-15 NOTE — Patient Outreach (Signed)
[facility]) Care Management  10/15/2019  Ryan [name] Jan 02, 1951 [id number]  Telephone outreach to attempt enrollment of Mr. Backs. Left a voicemail and requested a return call.    [name]. [name], MSN, [facility] Gerontological Nurse Practitioner [facility] Care Management [phone]

## 2019-10-17 ENCOUNTER — Other Ambulatory Visit: Payer: Self-pay | Admitting: *Deleted

## 2019-10-17 DIAGNOSIS — I739 Peripheral vascular disease, unspecified: Secondary | ICD-10-CM

## 2019-10-17 DIAGNOSIS — I779 Disorder of arteries and arterioles, unspecified: Secondary | ICD-10-CM

## 2019-10-17 NOTE — Patient Outreach (Signed)
[facility]) Care Management  10/17/2019  Ryan [name] Feb 08, 1950 [id number]   Second telephone outreach unsuccessful, left a message and requested a return call.  Will make final outreach in 2 weeks.  [name]. [name], MSN, [facility] Gerontological Nurse Practitioner [facility] Care Management [phone]

## 2019-11-06 ENCOUNTER — Other Ambulatory Visit: Payer: Self-pay | Admitting: Physician Assistant

## 2019-11-06 ENCOUNTER — Other Ambulatory Visit: Payer: Self-pay

## 2019-11-06 DIAGNOSIS — I779 Disorder of arteries and arterioles, unspecified: Secondary | ICD-10-CM

## 2019-11-06 MED ORDER — CLOPIDOGREL BISULFATE 75 MG PO TABS: 75.0000 mg | ORAL_TABLET | Freq: Every day | ORAL | 11 refills | Status: DC

## 2019-11-06 NOTE — Telephone Encounter (Signed)
Patients wife called to request a refill of Eliquis. On further investigation, he is only supposed to be on Eliquis for 30 days. Patient now to take Plavix and ASA. New script for Plavix obtained from PA and sent to pharmacy. Patients wife verbalized understanding.

## 2019-11-07 ENCOUNTER — Other Ambulatory Visit: Payer: Self-pay | Admitting: *Deleted

## 2019-11-07 NOTE — Patient Outreach (Signed)
[facility]) Care Management  11/07/2019  Catherine Oak Apr 24, 1950 [id number]   Third outreach to engage Mr. Zapanta. He was not at home and his wife says he does not want to participate.  Advised if they change their mind, I will still be available for consultation.  [name]. [name], MSN, [facility] Gerontological Nurse Practitioner [facility] Care Management [phone]

## 2019-11-10 ENCOUNTER — Ambulatory Visit: Payer: Medicare Other

## 2019-11-10 ENCOUNTER — Encounter (HOSPITAL_COMMUNITY): Payer: Medicare Other

## 2019-11-10 ENCOUNTER — Other Ambulatory Visit (HOSPITAL_COMMUNITY): Payer: Medicare Other

## 2020-01-14 DIAGNOSIS — Z23 Encounter for immunization: Secondary | ICD-10-CM | POA: Diagnosis not present

## 2020-01-31 DIAGNOSIS — U071 COVID-19: Secondary | ICD-10-CM

## 2020-01-31 HISTORY — DX: COVID-19: U07.1

## 2020-04-03 ENCOUNTER — Other Ambulatory Visit: Payer: Self-pay

## 2020-04-03 ENCOUNTER — Emergency Department (HOSPITAL_COMMUNITY)
Admission: EM | Admit: 2020-04-03 | Discharge: 2020-04-04 | Disposition: A | Discharge status: Home / Self Care | Payer: Medicare Other | Attending: Emergency Medicine | Admitting: Emergency Medicine

## 2020-04-03 ENCOUNTER — Emergency Department (HOSPITAL_COMMUNITY): Payer: Medicare Other

## 2020-04-03 ENCOUNTER — Encounter (HOSPITAL_COMMUNITY): Payer: Self-pay

## 2020-04-03 DIAGNOSIS — Z7982 Long term (current) use of aspirin: Secondary | ICD-10-CM | POA: Diagnosis not present

## 2020-04-03 DIAGNOSIS — Z23 Encounter for immunization: Secondary | ICD-10-CM | POA: Diagnosis not present

## 2020-04-03 DIAGNOSIS — Z79899 Other long term (current) drug therapy: Secondary | ICD-10-CM | POA: Diagnosis not present

## 2020-04-03 DIAGNOSIS — Z7902 Long term (current) use of antithrombotics/antiplatelets: Secondary | ICD-10-CM | POA: Diagnosis not present

## 2020-04-03 DIAGNOSIS — S6982XA Other specified injuries of left wrist, hand and finger(s), initial encounter: Secondary | ICD-10-CM | POA: Diagnosis present

## 2020-04-03 DIAGNOSIS — F17210 Nicotine dependence, cigarettes, uncomplicated: Secondary | ICD-10-CM | POA: Insufficient documentation

## 2020-04-03 DIAGNOSIS — S61012A Laceration without foreign body of left thumb without damage to nail, initial encounter: Secondary | ICD-10-CM | POA: Diagnosis not present

## 2020-04-03 DIAGNOSIS — S61112A Laceration without foreign body of left thumb with damage to nail, initial encounter: Secondary | ICD-10-CM | POA: Insufficient documentation

## 2020-04-03 DIAGNOSIS — Y92009 Unspecified place in unspecified non-institutional (private) residence as the place of occurrence of the external cause: Secondary | ICD-10-CM | POA: Diagnosis not present

## 2020-04-03 DIAGNOSIS — W270XXA Contact with workbench tool, initial encounter: Secondary | ICD-10-CM | POA: Diagnosis not present

## 2020-04-03 DIAGNOSIS — I10 Essential (primary) hypertension: Secondary | ICD-10-CM | POA: Insufficient documentation

## 2020-04-03 MED ORDER — POVIDONE-IODINE 10 % EX SOLN
CUTANEOUS | Status: AC
  Administered 2020-04-04: 1
  Filled 2020-04-03: qty 15

## 2020-04-03 MED ORDER — OXYCODONE-ACETAMINOPHEN 5-325 MG PO TABS
1.0000 | ORAL_TABLET | Freq: Once | ORAL | Status: AC
  Administered 2020-04-03: 1 via ORAL
  Filled 2020-04-03: qty 1

## 2020-04-03 MED ORDER — TETANUS-DIPHTH-ACELL PERTUSSIS 5-2.5-18.5 LF-MCG/0.5 IM SUSY
0.5000 mL | PREFILLED_SYRINGE | Freq: Once | INTRAMUSCULAR | Status: AC
  Administered 2020-04-03: 0.5 mL via INTRAMUSCULAR
  Filled 2020-04-03: qty 0.5

## 2020-04-03 NOTE — ED Triage Notes (Signed)
Pt presents to ED from home with laceration to left thumb obtained while using table saw. Bleeding is controlled at this time. Tip  Of thumb has "chunk" taken out. No visible bone exposure, subQ tissue exposed.

## 2020-04-03 NOTE — ED Provider Notes (Signed)
[facility] EMERGENCY DEPARTMENT Provider Note   CSN: [id number] Arrival date & time: 04/03/20  1936     History Chief Complaint  Patient presents with  . Extremity Laceration    Left thumb- cut chunk out of thumb tip with table saw.    Ryan [name] is a 70 y.o. male.  70 year old male who was using a table saw today and caught his left thumb in it.  He has resulting avulsion to the volar surface of his distal phalanx.  Patient came straight here.  Unknown tetanus.  No other injuries.     Past Medical History:  Diagnosis Date  . Hypertension   . PVD (peripheral vascular disease) [facility])     Patient Active Problem List   Diagnosis Date Noted  . Critical lower limb ischemia (HCC) 12/11/2016  . Claudication in peripheral vascular disease (HCC) 11/28/2016  . HTN (hypertension) 10/09/2014  . Tobacco use 10/09/2014  . PAD (peripheral artery disease) (HCC)     Past Surgical History:  Procedure Laterality Date  . ABDOMINAL AORTAGRAM Left 12/13/2016   ABDOMINAL AORTOGRAM W/LOWER EXTREMITY/notes 12/13/2016  . ABDOMINAL AORTOGRAM W/LOWER EXTREMITY N/A 11/28/2016   Procedure: ABDOMINAL AORTOGRAM W/LOWER EXTREMITY;  Surgeon: Yates Decamp, MD;  [facility] INVASIVE CV LAB;  Service: Cardiovascular;  Laterality: N/A;  bilateral  . ABDOMINAL AORTOGRAM W/LOWER EXTREMITY N/A 12/13/2016   Procedure: ABDOMINAL AORTOGRAM W/LOWER EXTREMITY;  Surgeon: Maeola Harman, MD;  Location: [facility] INVASIVE CV LAB;  Service: Cardiovascular;  Laterality: N/A;  unilater left leg  . ABDOMINAL AORTOGRAM W/LOWER EXTREMITY N/A 01/09/2018   Procedure: ABDOMINAL AORTOGRAM W/LOWER EXTREMITY;  Surgeon: Cephus Shelling, MD;  [facility] CV LAB;  Service: Cardiovascular;  Laterality: N/A;  . ABDOMINAL AORTOGRAM W/LOWER EXTREMITY Left 09/22/2019   Procedure: ABDOMINAL AORTOGRAM W/LOWER EXTREMITY;  Surgeon: Maeola Harman, MD;  Location: [facility] INVASIVE CV LAB;  Service: Cardiovascular;   Laterality: Left;  . FEMORAL-POPLITEAL BYPASS GRAFT Left 12/14/2016   Procedure: BYPASS GRAFT COMMON FEMORAL- BELOW KNEE POPLITEAL ARTERY with Bovine Patch to Below the knee poplitieal artery.;  Surgeon: Fransisco Hertz, MD;  Location: [facility] OR;  Service: Vascular;  Laterality: Left;  . FEMORAL-POPLITEAL BYPASS GRAFT Left 01/28/2018   Procedure: BYPASS GRAFT FEMORAL-BELOW KNEE POPLITEAL ARTERY REDO with propaten vascular graft removable ring;  Surgeon: Cephus Shelling, MD;  Location: [facility] OR;  Service: Vascular;  Laterality: Left;  . FEMUR SURGERY Right 2009   pt. has a rod in that leg  . LOWER EXTREMITY ANGIOGRAPHY Left 11/28/2016   Procedure: Lower Extremity Angiography;  Surgeon: Yates Decamp, MD;  Location: [facility] INVASIVE CV LAB;  Service: Cardiovascular;  Laterality: Left;  lysis followup lower leg  . PATCH ANGIOPLASTY Left 01/28/2018   Procedure: PATCH ANGIOPLASTY USING Livia Snellen BIOLOGIC PATCH;  Surgeon: Cephus Shelling, MD;  Location: [facility] OR;  Service: Vascular;  Laterality: Left;  . PERIPHERAL VASCULAR BALLOON ANGIOPLASTY  11/28/2016   Procedure: PERIPHERAL VASCULAR BALLOON ANGIOPLASTY;  Surgeon: Yates Decamp, MD;  [facility];  Service: Cardiovascular;;  SFA  . PERIPHERAL VASCULAR CATHETERIZATION N/A 08/11/2014   Procedure: Lower Extremity Angiography;  Surgeon: Yates Decamp, MD;  Location: [facility] INVASIVE CV LAB;  Service: Cardiovascular;  Laterality: N/A;  . PERIPHERAL VASCULAR INTERVENTION Left 09/22/2019   Procedure: PERIPHERAL VASCULAR INTERVENTION;  Surgeon: Maeola Harman, MD;  Location: [facility] INVASIVE CV LAB;  Service: Cardiovascular;  Laterality: Left;  . PERIPHERAL VASCULAR INTERVENTION Left 09/23/2019   Procedure: PERIPHERAL VASCULAR INTERVENTION;  Surgeon: Coral Else  W, MD;  [facility];  Service: Cardiovascular;  Laterality: Left;  . PERIPHERAL VASCULAR THROMBECTOMY  11/28/2016   Procedure: PERIPHERAL VASCULAR THROMBECTOMY;  Surgeon: Yates Decamp, MD;  [facility];  Service: Cardiovascular;;  Profunda  . PERIPHERAL VASCULAR THROMBECTOMY  11/28/2016   Procedure: PERIPHERAL VASCULAR THROMBECTOMY;  Surgeon: Yates Decamp, MD;  [facility];  Service: Cardiovascular;;  . PULMONARY THROMBECTOMY N/A 09/23/2019   Procedure: LYSIS RECHECK;  Surgeon: Nada Libman, MD;  [facility];  Service: Cardiovascular;  Laterality: N/A;  . THROMBECTOMY FEMORAL ARTERY Left 12/14/2016   Procedure: THROMBECTOMY PROFUNDA FEMORAL ARTERY;  Surgeon: Fransisco Hertz, MD;  Location: [facility] OR;  Service: Vascular;  Laterality: Left;       Family History  Problem Relation Age of Onset  . Hypertension Mother   . Diabetes Mother   . Hypertension Father     Social History   Tobacco Use  . Smoking status: Current Every Day Smoker    Packs/day: 0.10    Years: 56.00    Pack years: 5.60    Types: Cigarettes  . Smokeless tobacco: Never Used  Vaping Use  . Vaping Use: Never used  Substance Use Topics  . Alcohol use: Yes    Alcohol/week: 10.0 standard drinks    Types: 10 Shots of liquor per week    Comment:  01/29/2018 "1-2 shots per night"  . Drug use: No    Home Medications Prior to Admission medications   Medication Sig Start Date End Date Taking? Authorizing Provider  cephALEXin (KEFLEX) 500 MG capsule Take 1 capsule (500 mg total) by mouth 4 (four) times daily. 04/04/20  Yes Abbagayle Zaragoza, Barbara Cower, MD  aspirin EC 81 MG tablet Take 81 mg by mouth daily. Swallow whole.    [provider]  atenolol (TENORMIN) 50 MG tablet Take 50 mg by mouth daily.    [provider]  clopidogrel (PLAVIX) 75 MG tablet Take 1 tablet (75 mg total) by mouth daily. 08/11/14   Yates Decamp, MD  clopidogrel (PLAVIX) 75 MG tablet Take 1 tablet (75 mg total) by mouth daily. 11/06/19   Lars Mage, PA-C  losartan-hydrochlorothiazide (HYZAAR) 100-25 MG tablet Take 1 tablet by mouth daily.  07/10/19   [provider]   oxyCODONE-acetaminophen (PERCOCET) 5-325 MG tablet Take 1 tablet by mouth every 6 (six) hours as needed for severe pain. 04/04/20   Elexus Barman, Barbara Cower, MD  rosuvastatin (CRESTOR) 10 MG tablet Take 1 tablet (10 mg total) by mouth daily. 09/24/19   Lars Mage, PA-C    Allergies    Patient has no known allergies.  Review of Systems   Review of Systems  All other systems reviewed and are negative.   Physical Exam Updated Vital Signs BP 134/74 (BP Location: Left Arm)   Pulse 72   Temp 98.1 F (36.7 C) (Oral)   Resp 16   Ht 5\' 6"  (1.676 m)   Wt 68 kg   SpO2 100%   BMI 24.21 kg/m   Physical Exam Vitals and nursing note reviewed.  Constitutional:      Appearance: He is well-developed and well-nourished.  HENT:     Head: Normocephalic and atraumatic.     Nose: No congestion or rhinorrhea.     Mouth/Throat:     Mouth: Mucous membranes are moist.     Pharynx: Oropharynx is clear.  Eyes:     Pupils: Pupils are equal, round, and  reactive to light.  Cardiovascular:     Rate and Rhythm: Normal rate.  Pulmonary:     Effort: Pulmonary effort is normal. No respiratory distress.  Abdominal:     General: Abdomen is flat. There is no distension.  Musculoskeletal:        General: Normal range of motion.     Cervical back: Normal range of motion.     Comments: Avulsion to volar surface of L distal phalynx of thumb with mild oozing without pressure.   Skin:    General: Skin is warm and dry.     Coloration: Skin is not jaundiced or pale.  Neurological:     General: No focal deficit present.     Mental Status: He is alert.     ED Results / Procedures / Treatments   Labs (all labs ordered are listed, but only abnormal results are displayed) Labs Reviewed - No data to display  EKG None  Radiology DG Finger Thumb Left  Result Date: 04/04/2020 CLINICAL DATA:  Left thumb laceration EXAM: LEFT THUMB 2+V COMPARISON:  None. FINDINGS: Three view radiograph left thumb demonstrates normal  alignment. No fracture or dislocation. Circumscribed lytic lesion is seen within the base of the distal phalanx within the subarticular region possibly representing a small enchondroma or bone cyst. Joint spaces preserved. Soft tissue defect is seen involving the distal aspect of the left thumb in keeping with given history of laceration. No retained radiopaque foreign body. IMPRESSION: No fracture, dislocation, or retained radiopaque foreign body. Electronically Signed   By: Helyn Numbers MD   On: 04/04/2020 00:06    Procedures Procedures   Medications Ordered in ED Medications  Tdap (BOOSTRIX) injection 0.5 mL (0.5 mLs Intramuscular Given 04/03/20 2343)  oxyCODONE-acetaminophen (PERCOCET/ROXICET) 5-325 MG per tablet 1 tablet (1 tablet Oral Given 04/03/20 2343)  povidone-iodine (BETADINE) 10 % external solution (1 application  Given 04/04/20 0018)    ED Course  I have reviewed the triage vital signs and the nursing notes.  Pertinent labs & imaging results that were available during my care of the patient were reviewed by me and considered in my medical decision making (see chart for details).    MDM Rules/Calculators/A&P                         Wound care provided. Has an avulsion of pad of thumb, No real laceration to repair. Will need to heal and follow up with hand surgery for revision as necessary.   Final Clinical Impression(s) / ED Diagnoses Final diagnoses:  Laceration of left thumb without foreign body with damage to nail, initial encounter    Rx / DC Orders ED Discharge Orders         Ordered    oxyCODONE-acetaminophen (PERCOCET) 5-325 MG tablet  Every 6 hours PRN,   Status:  Discontinued        04/04/20 0028    cephALEXin (KEFLEX) 500 MG capsule  4 times daily        04/04/20 0030    oxyCODONE-acetaminophen (PERCOCET) 5-325 MG tablet  Every 6 hours PRN        04/04/20 0051           Amber Guthridge, Barbara Cower, MD 04/04/20 [phone]

## 2020-04-04 DIAGNOSIS — S61112A Laceration without foreign body of left thumb with damage to nail, initial encounter: Secondary | ICD-10-CM | POA: Diagnosis not present

## 2020-04-04 MED ORDER — OXYCODONE-ACETAMINOPHEN 5-325 MG PO TABS: 1.0000 | ORAL_TABLET | Freq: Four times a day (QID) | ORAL | 0 refills | Status: DC | PRN

## 2020-04-04 MED ORDER — CEPHALEXIN 500 MG PO CAPS: 500.0000 mg | ORAL_CAPSULE | Freq: Four times a day (QID) | ORAL | 0 refills | Status: DC

## 2020-04-08 DIAGNOSIS — Z4789 Encounter for other orthopedic aftercare: Secondary | ICD-10-CM | POA: Diagnosis not present

## 2020-04-08 DIAGNOSIS — S61012A Laceration without foreign body of left thumb without damage to nail, initial encounter: Secondary | ICD-10-CM | POA: Diagnosis not present

## 2020-04-14 ENCOUNTER — Ambulatory Visit (INDEPENDENT_AMBULATORY_CARE_PROVIDER_SITE_OTHER): Payer: Medicare Other | Admitting: Physician Assistant

## 2020-04-14 ENCOUNTER — Ambulatory Visit (INDEPENDENT_AMBULATORY_CARE_PROVIDER_SITE_OTHER)
Admission: RE | Admit: 2020-04-14 | Discharge: 2020-04-14 | Disposition: A | Discharge status: Home / Self Care | Payer: Medicare Other | Source: Ambulatory Visit | Attending: Vascular Surgery | Admitting: Vascular Surgery

## 2020-04-14 ENCOUNTER — Ambulatory Visit (HOSPITAL_COMMUNITY)
Admission: RE | Admit: 2020-04-14 | Discharge: 2020-04-14 | Disposition: A | Discharge status: Home / Self Care | Payer: Medicare Other | Source: Ambulatory Visit | Attending: Vascular Surgery | Admitting: Vascular Surgery

## 2020-04-14 ENCOUNTER — Other Ambulatory Visit: Payer: Self-pay

## 2020-04-14 VITALS — BP 115/72 | HR 56 | Temp 98.2°F | Resp 20 | Ht 66.0 in | Wt 143.9 lb

## 2020-04-14 DIAGNOSIS — I779 Disorder of arteries and arterioles, unspecified: Secondary | ICD-10-CM | POA: Insufficient documentation

## 2020-04-14 DIAGNOSIS — T82898A Other specified complication of vascular prosthetic devices, implants and grafts, initial encounter: Secondary | ICD-10-CM | POA: Diagnosis not present

## 2020-04-14 DIAGNOSIS — I739 Peripheral vascular disease, unspecified: Secondary | ICD-10-CM

## 2020-04-14 DIAGNOSIS — F17200 Nicotine dependence, unspecified, uncomplicated: Secondary | ICD-10-CM

## 2020-04-14 NOTE — Progress Notes (Signed)
Established Previous Bypass   History of Present Illness   Ryan [name] is a 70 y.o. (01-21-1951) male who presents for surveillance of LLE bypass.  He is status post left femoral to below the knee popliteal bypass with PTFE by Dr. Johny Drilling on 12/14/2016.  He then required redo left femoral to below the knee popliteal bypass with PTFE by Dr. Chestine Spore on 01/28/2018.  His bypass was found to be thrombosed in August 2021 and he underwent thrombolysis with stenting of proximal and distal anastomosis.  He presents today without complaints of claudication, rest pain, or nonhealing wounds of bilateral lower extremities.  He also has history of right SFA stent which is known to be occluded.  He is taking aspirin and Plavix daily.  He is still an everyday tobacco user.   The patient's PMH, PSH, SH, and FamHx were reviewed and are unchanged from prior visit.  Current Outpatient Medications  Medication Sig Dispense Refill  . aspirin EC 81 MG tablet Take 81 mg by mouth daily. Swallow whole.    Marland Kitchen atenolol (TENORMIN) 50 MG tablet Take 50 mg by mouth daily.    . cephALEXin (KEFLEX) 500 MG capsule Take 1 capsule (500 mg total) by mouth 4 (four) times daily. 28 capsule 0  . clopidogrel (PLAVIX) 75 MG tablet Take 1 tablet (75 mg total) by mouth daily. 30 tablet 6  . clopidogrel (PLAVIX) 75 MG tablet Take 1 tablet (75 mg total) by mouth daily. 30 tablet 11  . losartan-hydrochlorothiazide (HYZAAR) 100-25 MG tablet Take 1 tablet by mouth daily.     Marland Kitchen oxyCODONE-acetaminophen (PERCOCET) 5-325 MG tablet Take 1 tablet by mouth every 6 (six) hours as needed for severe pain. 10 tablet 0  . rosuvastatin (CRESTOR) 10 MG tablet Take 1 tablet (10 mg total) by mouth daily. 30 tablet 3   No current facility-administered medications for this visit.    REVIEW OF SYSTEMS (negative unless checked):   Cardiac:  []  Chest pain or chest pressure? []  Shortness of breath upon activity? []  Shortness of breath when lying flat? []   Irregular heart rhythm?  Vascular:  []  Pain in calf, thigh, or hip brought on by walking? []  Pain in feet at night that wakes you up from your sleep? []  Blood clot in your veins? []  Leg swelling?  Pulmonary:  []  Oxygen at home? []  Productive cough? []  Wheezing?  Neurologic:  []  Sudden weakness in arms or legs? []  Sudden numbness in arms or legs? []  Sudden onset of difficult speaking or slurred speech? []  Temporary loss of vision in one eye? []  Problems with dizziness?  Gastrointestinal:  []  Blood in stool? []  Vomited blood?  Genitourinary:  []  Burning when urinating? []  Blood in urine?  Psychiatric:  []  Major depression  Hematologic:  []  Bleeding problems? []  Problems with blood clotting?  Dermatologic:  []  Rashes or ulcers?  Constitutional:  []  Fever or chills?  Ear/Nose/Throat:  []  Change in hearing? []  Nose bleeds? []  Sore throat?  Musculoskeletal:  []  Back pain? []  Joint pain? []  Muscle pain?   Physical Examination   Vitals:   04/14/20 1207  BP: 115/72  Pulse: (!) 56  Resp: 20  Temp: 98.2 F (36.8 C)  TempSrc: Temporal  SpO2: 98%  Weight: 143 lb 14.4 oz (65.3 kg)  Height: 5\' 6"  (1.676 m)   Body mass index is 23.23 kg/m.  General:  WDWN in NAD; vital signs documented above Gait: Not observed HENT: WNL, normocephalic Pulmonary: normal non-labored breathing ,  without Rales, rhonchi,  wheezing Cardiac: regular HR Abdomen: soft, NT, no masses Skin: without rashes Vascular Exam/Pulses:  Right Left  Radial 2+ (normal) 2+ (normal)  DP absent 1+ (weak)  PT absent 1+ (weak)   Extremities: without ischemic changes, without Gangrene , without cellulitis; without open wounds;  Musculoskeletal: no muscle wasting or atrophy  Neurologic: A&O X 3;  No focal weakness or paresthesias are detected Psychiatric:  The pt has Normal affect.  Non-Invasive Vascular Imaging  ABI ABI/TBIToday's ABIToday's TBIPrevious ABIPrevious TBI   +-------+-----------+-----------+------------+------------+  Right 0.58    0.68    0.48    0.51      +-------+-----------+-----------+------------+------------+  Left  1.05    0.73    1.17    0.77      Bypass Duplex   Biphasic flow throughout bypass  Proximal and distal anastomosis stents widely patent   Medical Decision Making   Ryan [name] is a 70 y.o. male who presents for surveillance of left lower extremity bypass graft   Patient is without any claudication, nonhealing wounds, and rest pain of bilateral lower extremities  Based on arterial duplex left lower extremity bypass graft is widely patent  Known right SFA stent occlusion however no symptoms of critical limb ischemia  Continue aspirin, Plavix, statin daily  Encouraged smoking cessation  Recheck ABIs and bypass duplex in 1 year per protocol   Emilie Rutter PA-C [facility] [place] Office: [phone]  Clinic MD: Edilia Bo

## 2020-04-20 DIAGNOSIS — I10 Essential (primary) hypertension: Secondary | ICD-10-CM | POA: Diagnosis not present

## 2020-04-20 DIAGNOSIS — E11649 Type 2 diabetes mellitus with hypoglycemia without coma: Secondary | ICD-10-CM | POA: Diagnosis not present

## 2020-04-20 DIAGNOSIS — I739 Peripheral vascular disease, unspecified: Secondary | ICD-10-CM | POA: Diagnosis not present

## 2020-04-20 DIAGNOSIS — Z0000 Encounter for general adult medical examination without abnormal findings: Secondary | ICD-10-CM | POA: Diagnosis not present

## 2020-04-22 DIAGNOSIS — S61012A Laceration without foreign body of left thumb without damage to nail, initial encounter: Secondary | ICD-10-CM | POA: Diagnosis not present

## 2020-05-10 ENCOUNTER — Telehealth: Payer: Self-pay

## 2020-05-10 ENCOUNTER — Other Ambulatory Visit: Payer: Self-pay

## 2020-05-10 DIAGNOSIS — I779 Disorder of arteries and arterioles, unspecified: Secondary | ICD-10-CM

## 2020-05-10 NOTE — Telephone Encounter (Signed)
Patient's wife called to report patient pain in LLE. It is not swollen or discolored, but he is having severe pain when he walks and moderate pain at rest. He was seen in clinic in the middle of March 2022 and was to come back in a year - says the pain started four days after being seen and is getting progressively worse and keeping him up at night. Denies any open wounds. Doing repeat studies and follow up with [facility] tomorrow.

## 2020-05-11 ENCOUNTER — Ambulatory Visit (INDEPENDENT_AMBULATORY_CARE_PROVIDER_SITE_OTHER): Payer: Medicare Other | Admitting: Vascular Surgery

## 2020-05-11 ENCOUNTER — Other Ambulatory Visit: Payer: Self-pay

## 2020-05-11 ENCOUNTER — Ambulatory Visit (INDEPENDENT_AMBULATORY_CARE_PROVIDER_SITE_OTHER)
Admission: RE | Admit: 2020-05-11 | Discharge: 2020-05-11 | Disposition: A | Discharge status: Home / Self Care | Payer: Medicare Other | Source: Ambulatory Visit | Attending: Vascular Surgery | Admitting: Vascular Surgery

## 2020-05-11 ENCOUNTER — Other Ambulatory Visit: Payer: Self-pay | Admitting: *Deleted

## 2020-05-11 ENCOUNTER — Encounter: Payer: Self-pay | Admitting: *Deleted

## 2020-05-11 ENCOUNTER — Encounter: Payer: Self-pay | Admitting: Vascular Surgery

## 2020-05-11 VITALS — BP 117/83 | HR 81 | Temp 98.5°F | Ht 66.0 in | Wt 138.6 lb

## 2020-05-11 DIAGNOSIS — I739 Peripheral vascular disease, unspecified: Secondary | ICD-10-CM | POA: Diagnosis not present

## 2020-05-11 DIAGNOSIS — I779 Disorder of arteries and arterioles, unspecified: Secondary | ICD-10-CM

## 2020-05-11 DIAGNOSIS — I70229 Atherosclerosis of native arteries of extremities with rest pain, unspecified extremity: Secondary | ICD-10-CM

## 2020-05-11 MED ORDER — OXYCODONE-ACETAMINOPHEN 5-325 MG PO TABS
1.0000 | ORAL_TABLET | Freq: Four times a day (QID) | ORAL | 0 refills | Status: DC | PRN
Start: 2020-05-11 — End: 2020-06-01

## 2020-05-11 MED ORDER — OXYCODONE-ACETAMINOPHEN 5-325 MG PO TABS
1.0000 | ORAL_TABLET | Freq: Four times a day (QID) | ORAL | 0 refills | Status: DC | PRN
Start: 2020-05-11 — End: 2020-05-11

## 2020-05-11 NOTE — Progress Notes (Signed)
 Patient name: Ryan [name] MRN: [id number] DOB: 11/17/1950 Sex: male  REASON FOR VISIT: Triage visit for left leg rest pain  HPI: Ryan [name] is a 69 y.o. male with history of hypertension and tobacco abuse as well as peripheral vascular disease that presents for triage visit for evaluation of new left leg rest pain.  He initially had a left common femoral to below-knee pop bypass with propaten by Dr. Chn in 2018.  This subsequently occluded in 2019 and I performed a redo left common femoral to below-knee popliteal bypass with 6 mm ringed PTFE.  The redo bypass in 2019 lasted until about August 2021 when he presented with critical limb ischemia with an occluded bypass and underwent thrombolysis of the occluded left leg bypass.  This was treated with a proximal 7 x 40 Eluvia and a distal 5 x 60 Tigris on 09/23/2019.  Presentation today patient states that he had recurrent numbness and pain in the left foot that started about 8 or 9 days ago.  Can still wiggle his toes.  Pain in the toes.  He still smoking about 15 cigarettes a day.  Past Medical History:  Diagnosis Date  . Hypertension   . PVD (peripheral vascular disease) (HCC)     Past Surgical History:  Procedure Laterality Date  . ABDOMINAL AORTAGRAM Left 12/13/2016   ABDOMINAL AORTOGRAM W/LOWER EXTREMITY/notes 12/13/2016  . ABDOMINAL AORTOGRAM W/LOWER EXTREMITY N/A 11/28/2016   Procedure: ABDOMINAL AORTOGRAM W/LOWER EXTREMITY;  Surgeon: Ganji, Jay, MD;  [facility]: [facility];  Service: Cardiovascular;  Laterality: N/A;  bilateral  . ABDOMINAL AORTOGRAM W/LOWER EXTREMITY N/A 12/13/2016   Procedure: ABDOMINAL AORTOGRAM W/LOWER EXTREMITY;  Surgeon: Cain, Brandon Mertis Mosher, MD;  Location: [facility];  Service: Cardiovascular;  Laterality: N/A;  unilater left leg  . ABDOMINAL AORTOGRAM W/LOWER EXTREMITY N/A 01/09/2018   Procedure: ABDOMINAL AORTOGRAM W/LOWER EXTREMITY;  Surgeon: Jarelyn Bambach J, MD;  Location: [facility];  Service: Cardiovascular;  Laterality: N/A;  . ABDOMINAL AORTOGRAM W/LOWER EXTREMITY Left 09/22/2019   Procedure: ABDOMINAL AORTOGRAM W/LOWER EXTREMITY;  Surgeon: Cain, Brandon Rain Friedt, MD;  Location: [facility];  Service: Cardiovascular;  Laterality: Left;  . FEMORAL-POPLITEAL BYPASS GRAFT Left 12/14/2016   Procedure: BYPASS GRAFT COMMON FEMORAL- BELOW KNEE POPLITEAL ARTERY with Bovine Patch to Below the knee poplitieal artery.;  Surgeon: Chen, Brian L, MD;  Location: [facility] OR;  Service: Vascular;  Laterality: Left;  . FEMORAL-POPLITEAL BYPASS GRAFT Left 01/28/2018   Procedure: BYPASS GRAFT FEMORAL-BELOW KNEE POPLITEAL ARTERY REDO with propaten vascular graft removable ring;  Surgeon: Blake Goya J, MD;  Location: [facility] OR;  Service: Vascular;  Laterality: Left;  . FEMUR SURGERY Right 2009   pt. has a rod in that leg  . LOWER EXTREMITY ANGIOGRAPHY Left 11/28/2016   Procedure: Lower Extremity Angiography;  Surgeon: Ganji, Jay, MD;  [facility]: [facility];  Service: Cardiovascular;  Laterality: Left;  lysis followup lower leg  . PATCH ANGIOPLASTY Left 01/28/2018   Procedure: PATCH ANGIOPLASTY USING XENOSURE BIOLOGIC PATCH;  Surgeon: Kealie Barrie J, MD;  Location: [facility] OR;  Service: Vascular;  Laterality: Left;  . PERIPHERAL VASCULAR BALLOON ANGIOPLASTY  11/28/2016   Procedure: PERIPHERAL VASCULAR BALLOON ANGIOPLASTY;  Surgeon: Ganji, Jay, MD;  [facility]: [facility];  Service: Cardiovascular;;  SFA  . PERIPHERAL VASCULAR CATHETERIZATION N/A 08/11/2014   Procedure: Lower Extremity Angiography;  Surgeon: Jay Ganji, MD;  [facility]: [facility];  Service: Cardiovascular;  Laterality: N/A;  .   PERIPHERAL VASCULAR INTERVENTION Left 09/22/2019   Procedure: PERIPHERAL VASCULAR INTERVENTION;  Surgeon: Maeola Harman, MD;  Location: [facility] INVASIVE CV LAB;  Service: Cardiovascular;  Laterality: Left;  . PERIPHERAL VASCULAR INTERVENTION Left 09/23/2019    Procedure: PERIPHERAL VASCULAR INTERVENTION;  Surgeon: Nada Libman, MD;  [facility];  Service: Cardiovascular;  Laterality: Left;  . PERIPHERAL VASCULAR THROMBECTOMY  11/28/2016   Procedure: PERIPHERAL VASCULAR THROMBECTOMY;  Surgeon: Yates Decamp, MD;  [facility];  Service: Cardiovascular;;  Profunda  . PERIPHERAL VASCULAR THROMBECTOMY  11/28/2016   Procedure: PERIPHERAL VASCULAR THROMBECTOMY;  Surgeon: Yates Decamp, MD;  [facility];  Service: Cardiovascular;;  . PULMONARY THROMBECTOMY N/A 09/23/2019   Procedure: LYSIS RECHECK;  Surgeon: Nada Libman, MD;  [facility];  Service: Cardiovascular;  Laterality: N/A;  . THROMBECTOMY FEMORAL ARTERY Left 12/14/2016   Procedure: THROMBECTOMY PROFUNDA FEMORAL ARTERY;  Surgeon: Fransisco Hertz, MD;  Location: [facility] OR;  Service: Vascular;  Laterality: Left;    Family History  Problem Relation Age of Onset  . Hypertension Mother   . Diabetes Mother   . Hypertension Father     SOCIAL HISTORY: Social History   Tobacco Use  . Smoking status: Current Every Day Smoker    Packs/day: 0.75    Years: 56.00    Pack years: 42.00    Types: Cigarettes  . Smokeless tobacco: Never Used  Substance Use Topics  . Alcohol use: Yes    Alcohol/week: 10.0 standard drinks    Types: 10 Shots of liquor per week    Comment:  01/29/2018 "1-2 shots per night"    No Known Allergies  Current Outpatient Medications  Medication Sig Dispense Refill  . aspirin EC 81 MG tablet Take 81 mg by mouth daily. Swallow whole.    Marland Kitchen atenolol (TENORMIN) 50 MG tablet Take 50 mg by mouth daily.    . cephALEXin (KEFLEX) 500 MG capsule Take 1 capsule (500 mg total) by mouth 4 (four) times daily. 28 capsule 0  . clopidogrel (PLAVIX) 75 MG tablet Take 1 tablet (75 mg total) by mouth daily. 30 tablet 6  . clopidogrel (PLAVIX) 75 MG tablet Take 1 tablet (75 mg total) by mouth daily. 30 tablet 11  .  losartan-hydrochlorothiazide (HYZAAR) 100-25 MG tablet Take 1 tablet by mouth daily.     Marland Kitchen oxyCODONE-acetaminophen (PERCOCET) 5-325 MG tablet Take 1 tablet by mouth every 6 (six) hours as needed for severe pain. 10 tablet 0  . rosuvastatin (CRESTOR) 10 MG tablet Take 1 tablet (10 mg total) by mouth daily. 30 tablet 3   No current facility-administered medications for this visit.    REVIEW OF SYSTEMS:  [X]  denotes positive finding, [ ]  denotes negative finding Cardiac  Comments:  Chest pain or chest pressure:    Shortness of breath upon exertion:    Short of breath when lying flat:    Irregular heart rhythm:        Vascular    Pain in calf, thigh, or hip brought on by ambulation:    Pain in feet at night that wakes you up from your sleep:     Blood clot in your veins:    Leg swelling:         Pulmonary    Oxygen at home:    Productive cough:     Wheezing:         Neurologic    Sudden weakness in arms or  legs:     Sudden numbness in arms or legs:     Sudden onset of difficulty speaking or slurred speech:    Temporary loss of vision in one eye:     Problems with dizziness:         Gastrointestinal    Blood in stool:     Vomited blood:         Genitourinary    Burning when urinating:     Blood in urine:        Psychiatric    Major depression:         Hematologic    Bleeding problems:    Problems with blood clotting too easily:        Skin    Rashes or ulcers:        Constitutional    Fever or chills:      PHYSICAL EXAM: Vitals:   05/11/20 1558  BP: 117/83  Pulse: 81  Temp: 98.5 F (36.9 C)  TempSrc: Skin  SpO2: 100%  Weight: 138 lb 9.6 oz (62.9 kg)  Height: 5\' 6"  (1.676 m)    GENERAL: The patient is a well-nourished male, in no acute distress. The vital signs are documented above. CARDIAC: There is a regular rate and rhythm.  VASCULAR:  Bilateral femoral pulses are palpable No palpable left pedal pulses Left foot numb, able to wiggle toes  DATA:    I reviewed previous arteriogram imaging of the left leg bypass and dominant runoff appears to be PT/peroneal.  Lower extremity duplex today confirms a left common femoral to below-knee pop bypass that is occluded.  Assessment/Plan:  70 year old male presents with occluded left common femoral to below-knee popliteal bypass.  This is a redo bypass that was performed in 2019 by myself and has already undergone one attempt at thrombolysis last year with stenting of both the proximal and distal bypass graft.  I have recommended another attempt at thrombolysis given I think he has fairly limited options moving forward.  Sounds like his symptoms started 8 or 9 days and I am hopeful that thrombolysis will be successful.  I discussed there will be some risk that this does not work and he may be facing a more proximal amputation.  Risks of lysis discussed. We will get him added on tomorrow with Dr. 2020 for lysis catheter placement and I will plan to take him back the next day in the cath lab.   [name], MD [facility] [place] Office: [phone]  [phone]

## 2020-05-11 NOTE — H&P (View-Only) (Signed)
Patient name: Ryan [name] MRN: [id number] DOB: 18-Jan-1951 Sex: male  REASON FOR VISIT: Triage visit for left leg rest pain  HPI: Ryan [name] is a 70 y.o. male with history of hypertension and tobacco abuse as well as peripheral vascular disease that presents for triage visit for evaluation of new left leg rest pain.  He initially had a left common femoral to below-knee pop bypass with propaten by Dr. Jobe Igo in 2018.  This subsequently occluded in 2019 and I performed a redo left common femoral to below-knee popliteal bypass with 6 mm ringed PTFE.  The redo bypass in 2019 lasted until about August 2021 when he presented with critical limb ischemia with an occluded bypass and underwent thrombolysis of the occluded left leg bypass.  This was treated with a proximal 7 x 40 Eluvia and a distal 5 x 60 Tigris on 09/23/2019.  Presentation today patient states that he had recurrent numbness and pain in the left foot that started about 8 or 9 days ago.  Can still wiggle his toes.  Pain in the toes.  He still smoking about 15 cigarettes a day.  Past Medical History:  Diagnosis Date  . Hypertension   . PVD (peripheral vascular disease) (HCC)     Past Surgical History:  Procedure Laterality Date  . ABDOMINAL AORTAGRAM Left 12/13/2016   ABDOMINAL AORTOGRAM W/LOWER EXTREMITY/notes 12/13/2016  . ABDOMINAL AORTOGRAM W/LOWER EXTREMITY N/A 11/28/2016   Procedure: ABDOMINAL AORTOGRAM W/LOWER EXTREMITY;  Surgeon: Yates Decamp, MD;  [facility]: [facility];  Service: Cardiovascular;  Laterality: N/A;  bilateral  . ABDOMINAL AORTOGRAM W/LOWER EXTREMITY N/A 12/13/2016   Procedure: ABDOMINAL AORTOGRAM W/LOWER EXTREMITY;  Surgeon: Maeola Harman, MD;  Location: [facility] INVASIVE CV LAB;  Service: Cardiovascular;  Laterality: N/A;  unilater left leg  . ABDOMINAL AORTOGRAM W/LOWER EXTREMITY N/A 01/09/2018   Procedure: ABDOMINAL AORTOGRAM W/LOWER EXTREMITY;  Surgeon: Cephus Shelling, MD;  Location: [facility];  Service: Cardiovascular;  Laterality: N/A;  . ABDOMINAL AORTOGRAM W/LOWER EXTREMITY Left 09/22/2019   Procedure: ABDOMINAL AORTOGRAM W/LOWER EXTREMITY;  Surgeon: Maeola Harman, MD;  Location: [facility] INVASIVE CV LAB;  Service: Cardiovascular;  Laterality: Left;  . FEMORAL-POPLITEAL BYPASS GRAFT Left 12/14/2016   Procedure: BYPASS GRAFT COMMON FEMORAL- BELOW KNEE POPLITEAL ARTERY with Bovine Patch to Below the knee poplitieal artery.;  Surgeon: Fransisco Hertz, MD;  Location: [facility] OR;  Service: Vascular;  Laterality: Left;  . FEMORAL-POPLITEAL BYPASS GRAFT Left 01/28/2018   Procedure: BYPASS GRAFT FEMORAL-BELOW KNEE POPLITEAL ARTERY REDO with propaten vascular graft removable ring;  Surgeon: Cephus Shelling, MD;  Location: [facility] OR;  Service: Vascular;  Laterality: Left;  . FEMUR SURGERY Right 2009   pt. has a rod in that leg  . LOWER EXTREMITY ANGIOGRAPHY Left 11/28/2016   Procedure: Lower Extremity Angiography;  Surgeon: Yates Decamp, MD;  Location: [facility] INVASIVE CV LAB;  Service: Cardiovascular;  Laterality: Left;  lysis followup lower leg  . PATCH ANGIOPLASTY Left 01/28/2018   Procedure: PATCH ANGIOPLASTY USING Livia Snellen BIOLOGIC PATCH;  Surgeon: Cephus Shelling, MD;  Location: [facility] OR;  Service: Vascular;  Laterality: Left;  . PERIPHERAL VASCULAR BALLOON ANGIOPLASTY  11/28/2016   Procedure: PERIPHERAL VASCULAR BALLOON ANGIOPLASTY;  Surgeon: Yates Decamp, MD;  [facility]: [facility];  Service: Cardiovascular;;  SFA  . PERIPHERAL VASCULAR CATHETERIZATION N/A 08/11/2014   Procedure: Lower Extremity Angiography;  Surgeon: Yates Decamp, MD;  Location: [facility] INVASIVE CV LAB;  Service: Cardiovascular;  Laterality: N/A;  .  PERIPHERAL VASCULAR INTERVENTION Left 09/22/2019   Procedure: PERIPHERAL VASCULAR INTERVENTION;  Surgeon: Maeola Harman, MD;  Location: [facility] INVASIVE CV LAB;  Service: Cardiovascular;  Laterality: Left;  . PERIPHERAL VASCULAR INTERVENTION Left 09/23/2019    Procedure: PERIPHERAL VASCULAR INTERVENTION;  Surgeon: Nada Libman, MD;  Location: [facility];  Service: Cardiovascular;  Laterality: Left;  . PERIPHERAL VASCULAR THROMBECTOMY  11/28/2016   Procedure: PERIPHERAL VASCULAR THROMBECTOMY;  Surgeon: Yates Decamp, MD;  [facility]: [facility];  Service: Cardiovascular;;  Profunda  . PERIPHERAL VASCULAR THROMBECTOMY  11/28/2016   Procedure: PERIPHERAL VASCULAR THROMBECTOMY;  Surgeon: Yates Decamp, MD;  [facility]: [facility];  Service: Cardiovascular;;  . PULMONARY THROMBECTOMY N/A 09/23/2019   Procedure: LYSIS RECHECK;  Surgeon: Nada Libman, MD;  Location: [facility];  Service: Cardiovascular;  Laterality: N/A;  . THROMBECTOMY FEMORAL ARTERY Left 12/14/2016   Procedure: THROMBECTOMY PROFUNDA FEMORAL ARTERY;  Surgeon: Fransisco Hertz, MD;  Location: [facility] OR;  Service: Vascular;  Laterality: Left;    Family History  Problem Relation Age of Onset  . Hypertension Mother   . Diabetes Mother   . Hypertension Father     SOCIAL HISTORY: Social History   Tobacco Use  . Smoking status: Current Every Day Smoker    Packs/day: 0.75    Years: 56.00    Pack years: 42.00    Types: Cigarettes  . Smokeless tobacco: Never Used  Substance Use Topics  . Alcohol use: Yes    Alcohol/week: 10.0 standard drinks    Types: 10 Shots of liquor per week    Comment:  01/29/2018 "1-2 shots per night"    No Known Allergies  Current Outpatient Medications  Medication Sig Dispense Refill  . aspirin EC 81 MG tablet Take 81 mg by mouth daily. Swallow whole.    Marland Kitchen atenolol (TENORMIN) 50 MG tablet Take 50 mg by mouth daily.    . cephALEXin (KEFLEX) 500 MG capsule Take 1 capsule (500 mg total) by mouth 4 (four) times daily. 28 capsule 0  . clopidogrel (PLAVIX) 75 MG tablet Take 1 tablet (75 mg total) by mouth daily. 30 tablet 6  . clopidogrel (PLAVIX) 75 MG tablet Take 1 tablet (75 mg total) by mouth daily. 30 tablet 11  .  losartan-hydrochlorothiazide (HYZAAR) 100-25 MG tablet Take 1 tablet by mouth daily.     Marland Kitchen oxyCODONE-acetaminophen (PERCOCET) 5-325 MG tablet Take 1 tablet by mouth every 6 (six) hours as needed for severe pain. 10 tablet 0  . rosuvastatin (CRESTOR) 10 MG tablet Take 1 tablet (10 mg total) by mouth daily. 30 tablet 3   No current facility-administered medications for this visit.    REVIEW OF SYSTEMS:  [X]  denotes positive finding, [ ]  denotes negative finding Cardiac  Comments:  Chest pain or chest pressure:    Shortness of breath upon exertion:    Short of breath when lying flat:    Irregular heart rhythm:        Vascular    Pain in calf, thigh, or hip brought on by ambulation:    Pain in feet at night that wakes you up from your sleep:     Blood clot in your veins:    Leg swelling:         Pulmonary    Oxygen at home:    Productive cough:     Wheezing:         Neurologic    Sudden weakness in arms or  legs:     Sudden numbness in arms or legs:     Sudden onset of difficulty speaking or slurred speech:    Temporary loss of vision in one eye:     Problems with dizziness:         Gastrointestinal    Blood in stool:     Vomited blood:         Genitourinary    Burning when urinating:     Blood in urine:        Psychiatric    Major depression:         Hematologic    Bleeding problems:    Problems with blood clotting too easily:        Skin    Rashes or ulcers:        Constitutional    Fever or chills:      PHYSICAL EXAM: Vitals:   05/11/20 1558  BP: 117/83  Pulse: 81  Temp: 98.5 F (36.9 C)  TempSrc: Skin  SpO2: 100%  Weight: 138 lb 9.6 oz (62.9 kg)  Height: 5\' 6"  (1.676 m)    GENERAL: The patient is a well-nourished male, in no acute distress. The vital signs are documented above. CARDIAC: There is a regular rate and rhythm.  VASCULAR:  Bilateral femoral pulses are palpable No palpable left pedal pulses Left foot numb, able to wiggle toes  DATA:    I reviewed previous arteriogram imaging of the left leg bypass and dominant runoff appears to be PT/peroneal.  Lower extremity duplex today confirms a left common femoral to below-knee pop bypass that is occluded.  Assessment/Plan:  70 year old male presents with occluded left common femoral to below-knee popliteal bypass.  This is a redo bypass that was performed in 2019 by myself and has already undergone one attempt at thrombolysis last year with stenting of both the proximal and distal bypass graft.  I have recommended another attempt at thrombolysis given I think he has fairly limited options moving forward.  Sounds like his symptoms started 8 or 9 days and I am hopeful that thrombolysis will be successful.  I discussed there will be some risk that this does not work and he may be facing a more proximal amputation.  Risks of lysis discussed. We will get him added on tomorrow with Dr. 2020 for lysis catheter placement and I will plan to take him back the next day in the cath lab.   [name], MD [facility] [place] Office: [phone]  [phone]

## 2020-05-12 ENCOUNTER — Other Ambulatory Visit: Payer: Self-pay

## 2020-05-12 ENCOUNTER — Encounter (HOSPITAL_COMMUNITY)
Admission: RE | Disposition: A | Discharge status: Home / Self Care | Payer: Self-pay | Source: Home / Self Care | Attending: Vascular Surgery

## 2020-05-12 ENCOUNTER — Inpatient Hospital Stay (HOSPITAL_COMMUNITY)
Admission: RE | Admit: 2020-05-12 | Discharge: 2020-05-15 | DRG: 253 | Disposition: A | Discharge status: Home / Self Care | Payer: Medicare Other | Attending: Vascular Surgery | Admitting: Vascular Surgery

## 2020-05-12 DIAGNOSIS — I743 Embolism and thrombosis of arteries of the lower extremities: Secondary | ICD-10-CM | POA: Diagnosis present

## 2020-05-12 DIAGNOSIS — N179 Acute kidney failure, unspecified: Secondary | ICD-10-CM | POA: Diagnosis present

## 2020-05-12 DIAGNOSIS — Z8249 Family history of ischemic heart disease and other diseases of the circulatory system: Secondary | ICD-10-CM | POA: Diagnosis not present

## 2020-05-12 DIAGNOSIS — I10 Essential (primary) hypertension: Secondary | ICD-10-CM | POA: Diagnosis present

## 2020-05-12 DIAGNOSIS — I739 Peripheral vascular disease, unspecified: Secondary | ICD-10-CM | POA: Diagnosis present

## 2020-05-12 DIAGNOSIS — Z833 Family history of diabetes mellitus: Secondary | ICD-10-CM

## 2020-05-12 DIAGNOSIS — L97529 Non-pressure chronic ulcer of other part of left foot with unspecified severity: Secondary | ICD-10-CM | POA: Diagnosis present

## 2020-05-12 DIAGNOSIS — Z95820 Peripheral vascular angioplasty status with implants and grafts: Secondary | ICD-10-CM | POA: Diagnosis not present

## 2020-05-12 DIAGNOSIS — T82858A Stenosis of vascular prosthetic devices, implants and grafts, initial encounter: Secondary | ICD-10-CM | POA: Diagnosis not present

## 2020-05-12 DIAGNOSIS — F17210 Nicotine dependence, cigarettes, uncomplicated: Secondary | ICD-10-CM | POA: Diagnosis present

## 2020-05-12 DIAGNOSIS — I70245 Atherosclerosis of native arteries of left leg with ulceration of other part of foot: Secondary | ICD-10-CM | POA: Diagnosis present

## 2020-05-12 DIAGNOSIS — T82868A Thrombosis of vascular prosthetic devices, implants and grafts, initial encounter: Secondary | ICD-10-CM | POA: Diagnosis not present

## 2020-05-12 DIAGNOSIS — Z79899 Other long term (current) drug therapy: Secondary | ICD-10-CM | POA: Diagnosis not present

## 2020-05-12 DIAGNOSIS — I70202 Unspecified atherosclerosis of native arteries of extremities, left leg: Secondary | ICD-10-CM | POA: Diagnosis not present

## 2020-05-12 DIAGNOSIS — D62 Acute posthemorrhagic anemia: Secondary | ICD-10-CM | POA: Diagnosis not present

## 2020-05-12 DIAGNOSIS — Z7902 Long term (current) use of antithrombotics/antiplatelets: Secondary | ICD-10-CM

## 2020-05-12 DIAGNOSIS — Z20822 Contact with and (suspected) exposure to covid-19: Secondary | ICD-10-CM | POA: Diagnosis present

## 2020-05-12 DIAGNOSIS — Y832 Surgical operation with anastomosis, bypass or graft as the cause of abnormal reaction of the patient, or of later complication, without mention of misadventure at the time of the procedure: Secondary | ICD-10-CM | POA: Diagnosis present

## 2020-05-12 HISTORY — PX: PERIPHERAL VASCULAR THROMBECTOMY: CATH118306

## 2020-05-12 LAB — POCT I-STAT, CHEM 8
BUN: 45 mg/dL — ABNORMAL HIGH (ref 8–23)
Calcium, Ion: 1.21 mmol/L (ref 1.15–1.40)
Chloride: 110 mmol/L (ref 98–111)
Creatinine, Ser: 2.8 mg/dL — ABNORMAL HIGH (ref 0.61–1.24)
Glucose, Bld: 97 mg/dL (ref 70–99)
HCT: 50 % (ref 39.0–52.0)
Hemoglobin: 17 g/dL (ref 13.0–17.0)
Potassium: 5.2 mmol/L — ABNORMAL HIGH (ref 3.5–5.1)
Sodium: 137 mmol/L (ref 135–145)
TCO2: 20 mmol/L — ABNORMAL LOW (ref 22–32)

## 2020-05-12 LAB — CBC
HCT: 45.9 % (ref 39.0–52.0)
Hemoglobin: 14.2 g/dL (ref 13.0–17.0)
MCH: 26.4 pg (ref 26.0–34.0)
MCHC: 30.9 g/dL (ref 30.0–36.0)
MCV: 85.5 fL (ref 80.0–100.0)
Platelets: 291 10*3/uL (ref 150–400)
RBC: 5.37 MIL/uL (ref 4.22–5.81)
RDW: 16.5 % — ABNORMAL HIGH (ref 11.5–15.5)
WBC: 8.9 10*3/uL (ref 4.0–10.5)
nRBC: 0 % (ref 0.0–0.2)

## 2020-05-12 LAB — FIBRINOGEN: Fibrinogen: 408 mg/dL (ref 210–475)

## 2020-05-12 LAB — BASIC METABOLIC PANEL
Anion gap: 9 (ref 5–15)
BUN: 43 mg/dL — ABNORMAL HIGH (ref 8–23)
CO2: 19 mmol/L — ABNORMAL LOW (ref 22–32)
Calcium: 8.5 mg/dL — ABNORMAL LOW (ref 8.9–10.3)
Chloride: 107 mmol/L (ref 98–111)
Creatinine, Ser: 2.52 mg/dL — ABNORMAL HIGH (ref 0.61–1.24)
GFR, Estimated: 27 mL/min — ABNORMAL LOW (ref >60)
Glucose, Bld: 90 mg/dL (ref 70–99)
Potassium: 4.3 mmol/L (ref 3.5–5.1)
Sodium: 135 mmol/L (ref 135–145)

## 2020-05-12 LAB — HEPARIN LEVEL (UNFRACTIONATED): Heparin Unfractionated: >1.1 IU/mL — ABNORMAL HIGH (ref 0.30–0.70)

## 2020-05-12 LAB — SARS CORONAVIRUS 2 BY RT PCR (HOSPITAL ORDER, PERFORMED IN ~~LOC~~ HOSPITAL LAB): SARS Coronavirus 2: NEGATIVE

## 2020-05-12 SURGERY — PERIPHERAL VASCULAR THROMBECTOMY
Anesthesia: LOCAL

## 2020-05-12 MED ORDER — MORPHINE SULFATE (PF) 4 MG/ML IV SOLN
5.0000 mg | INTRAVENOUS | Status: DC | PRN
  Administered 2020-05-12 – 2020-05-13 (×11): 5 mg via INTRAVENOUS
  Filled 2020-05-12 (×10): qty 2

## 2020-05-12 MED ORDER — SODIUM CHLORIDE 0.9% FLUSH: 3.0000 mL | INTRAVENOUS | Status: DC | PRN

## 2020-05-12 MED ORDER — LIDOCAINE HCL (PF) 1 % IJ SOLN
INTRAMUSCULAR | Status: DC | PRN
  Administered 2020-05-12: 15 mL via INTRADERMAL

## 2020-05-12 MED ORDER — HEPARIN (PORCINE) 25000 UT/250ML-% IV SOLN
800.0000 [IU]/h | INTRAVENOUS | Status: DC
  Administered 2020-05-12: 800 [IU]/h via INTRAVENOUS
  Filled 2020-05-12: qty 250

## 2020-05-12 MED ORDER — SODIUM CHLORIDE 0.9 % IV SOLN
0.5000 mg/h | INTRAVENOUS | Status: DC
  Administered 2020-05-12 – 2020-05-13 (×2): 0.5 mg/h
  Filled 2020-05-12 (×4): qty 10

## 2020-05-12 MED ORDER — HEPARIN (PORCINE) IN NACL 1000-0.9 UT/500ML-% IV SOLN
INTRAVENOUS | Status: AC
  Filled 2020-05-12: qty 1000

## 2020-05-12 MED ORDER — IODIXANOL 320 MG/ML IV SOLN
INTRAVENOUS | Status: DC | PRN
  Administered 2020-05-12: 2 mL via INTRA_ARTERIAL

## 2020-05-12 MED ORDER — SODIUM CHLORIDE 0.9 % IV SOLN: INTRAVENOUS | Status: DC

## 2020-05-12 MED ORDER — MIDAZOLAM HCL 2 MG/2ML IJ SOLN: 1.0000 mg | INTRAMUSCULAR | Status: DC | PRN

## 2020-05-12 MED ORDER — ONDANSETRON HCL 4 MG/2ML IJ SOLN: 4.0000 mg | Freq: Four times a day (QID) | INTRAMUSCULAR | Status: DC | PRN

## 2020-05-12 MED ORDER — HEPARIN (PORCINE) IN NACL 1000-0.9 UT/500ML-% IV SOLN
INTRAVENOUS | Status: DC | PRN
  Administered 2020-05-12 (×2): 500 mL

## 2020-05-12 MED ORDER — HEPARIN SODIUM (PORCINE) 1000 UNIT/ML IJ SOLN
INTRAMUSCULAR | Status: AC
  Filled 2020-05-12: qty 1

## 2020-05-12 MED ORDER — HEPARIN SODIUM (PORCINE) 1000 UNIT/ML IJ SOLN
INTRAMUSCULAR | Status: DC | PRN
  Administered 2020-05-12: 6000 [IU] via INTRAVENOUS

## 2020-05-12 MED ORDER — SODIUM CHLORIDE 0.9 % IV SOLN: 250.0000 mL | INTRAVENOUS | Status: DC | PRN

## 2020-05-12 MED ORDER — LIDOCAINE HCL (PF) 1 % IJ SOLN
INTRAMUSCULAR | Status: AC
  Filled 2020-05-12: qty 30

## 2020-05-12 MED ORDER — HEPARIN (PORCINE) 25000 UT/250ML-% IV SOLN
500.0000 [IU]/h | INTRAVENOUS | Status: DC
  Administered 2020-05-12: 500 [IU]/h via INTRA_ARTERIAL
  Filled 2020-05-12: qty 250

## 2020-05-12 MED ORDER — SODIUM CHLORIDE 0.9% FLUSH: 3.0000 mL | Freq: Two times a day (BID) | INTRAVENOUS | Status: DC

## 2020-05-12 SURGICAL SUPPLY — 13 items
CATH INFUS 135CMX50CM (CATHETERS) ×2 IMPLANT
CATH OMNI FLUSH 5F 65CM (CATHETERS) ×2 IMPLANT
CATH QUICKCROSS .035X135CM (MICROCATHETER) ×2 IMPLANT
CATH QUICKCROSS SUPP .035X90CM (MICROCATHETER) ×2 IMPLANT
FILTER CO2 0.2 MICRON (VASCULAR PRODUCTS) ×2 IMPLANT
GLIDEWIRE ADV .035X260CM (WIRE) ×2 IMPLANT
KIT MICROPUNCTURE NIT STIFF (SHEATH) ×2 IMPLANT
RESERVOIR CO2 (VASCULAR PRODUCTS) ×2 IMPLANT
SET FLUSH CO2 (MISCELLANEOUS) ×2 IMPLANT
SHEATH PINNACLE 5F 10CM (SHEATH) ×2 IMPLANT
SHEATH PINNACLE ST 6F 45CM (SHEATH) ×2 IMPLANT
SHEATH PROBE COVER 6X72 (BAG) ×2 IMPLANT
WIRE BENTSON .035X145CM (WIRE) ×2 IMPLANT

## 2020-05-12 NOTE — Plan of Care (Signed)
  Problem: Clinical Measurements: Goal: Ability to maintain clinical measurements within normal limits will improve Outcome: Progressing Goal: Will remain free from infection Outcome: Progressing Goal: Diagnostic test results will improve Outcome: Progressing Goal: Respiratory complications will improve Outcome: Progressing Goal: Cardiovascular complication will be avoided Outcome: Progressing   Problem: Activity: Goal: Risk for activity intolerance will decrease Outcome: Progressing   Problem: Nutrition: Goal: Adequate nutrition will be maintained Outcome: Progressing   Problem: Coping: Goal: Level of anxiety will decrease Outcome: Progressing   Problem: Elimination: Goal: Will not experience complications related to bowel motility Outcome: Progressing Goal: Will not experience complications related to urinary retention Outcome: Progressing   Problem: Pain Managment: Goal: General experience of comfort will improve Outcome: Progressing   Problem: Safety: Goal: Ability to remain free from injury will improve Outcome: Progressing   Problem: Skin Integrity: Goal: Risk for impaired skin integrity will decrease Outcome: Progressing   Problem: Activity: Goal: Ability to return to baseline activity level will improve Outcome: Progressing   Problem: Cardiovascular: Goal: Ability to achieve and maintain adequate cardiovascular perfusion will improve Outcome: Progressing Goal: Vascular access site(s) Level 0-1 will be maintained Outcome: Progressing   

## 2020-05-12 NOTE — Progress Notes (Signed)
Notified Victorino Dike of patient Hct 48, creat 2.8.

## 2020-05-12 NOTE — Progress Notes (Signed)
Patient with renal insufficiency (Scr 2.8, K+ 5.2) on preprocedure labs. Suspect he is dehydrated based on hemoconcentration (HCT 50). Discussed with Dr. Chestine Spore, we both feel benefits > risks for delay in thrombolysis. Will plan to give generous IV fluids overnight and monitor potassium carefully.  If hyperkalemia or renal function is deteriorating will stop thrombolysis. Discussed plan with patient.  Ryan [name]. Ryan Antu, MD [facility] Phone Number: [phone] 05/12/2020 2:20 PM

## 2020-05-12 NOTE — Progress Notes (Addendum)
ANTICOAGULATION CONSULT NOTE - Initial Consult  Pharmacy Consult:  Systemic heparin Indication:  Thrombosed LLE bypass  No Known Allergies  Patient Measurements: Height: (P) 5\' 6"  (167.6 cm) Weight: (P) 63.5 kg (140 lb) IBW/kg (Calculated) : (P) 63.8 Heparin Dosing Weight: 63 kg  Vital Signs: Temp Source: (P) Oral (04/13 1147) BP: 161/87 (04/13 1529) Pulse Rate: 75 (04/13 1529)  Labs: Recent Labs    05/12/20 1304  HGB 17.0  HCT 50.0  CREATININE 2.80*    Estimated Creatinine Clearance: 21.5 mL/min (A) (by C-G formula based on SCr of 2.8 mg/dL (H)).   Medical History: Past Medical History:  Diagnosis Date  . Hypertension   . PVD (peripheral vascular disease) (HCC)     Assessment: 42 YOM presented with left leg rest pain.  Patient has had several bypasses and thrombectomy.  S/p LLE angiogram with cannulation and initiation of catheter directed thrombolysis on 4/13 and Pharmacy consulted to dose systemic heparin.  Patient is also getting heparin through the intra-arterial sheath and alteplase infusion.  No baseline platelets; was WNL in August 2021.  Goal of Therapy:  Heparin level 0.3-0.7 units/hr per MD (not 0.2-0.5 units/mL per the consult) Monitor platelets by anticoagulation protocol: Yes   Plan:  Hold off on adding systemic heparin  Heparin through intra-arterial sheath at 800 units/hr - per Vascular, do not adjust unless level is high without systemic heparin Alteplase infusion at 0.5 mg/hr per MD Check 8 hr heparin level with CBC Daily heparin level and CBC  Thad Osoria D. September 2021, PharmD, BCPS, BCCCP 05/12/2020, 4:21 PM  ================================  Addendum: RN reports that alteplase is infusing at 0.5 mg/hr, heparin 500 units/hr is infusing through the sheath and heparin 800 units/hr is infusing through pIV when patient came to the unit.  D/C pIV heparin for now and infuse 800 units/hr through the sheath as instructed by MD Check 8 hr HL and add systemic  heparin if needed  Zedrick Springsteen D. 03-08-1984, PharmD, BCPS, BCCCP 05/12/2020, 5:32 PM

## 2020-05-12 NOTE — Interval H&P Note (Signed)
History and Physical Interval Note:  05/12/2020 2:18 PM  Ryan [name]  has presented today for surgery, with the diagnosis of pad.  The various methods of treatment have been discussed with the patient and family. After consideration of risks, benefits and other options for treatment, the patient has consented to  Procedure(s): PERIPHERAL VASCULAR THROMBECTOMY-Left Leg (N/A) as a surgical intervention.  The patient's history has been reviewed, patient examined, no change in status, stable for surgery.  I have reviewed the patient's chart and labs.  Questions were answered to the patient's satisfaction.     Leonie Douglas

## 2020-05-12 NOTE — Op Note (Signed)
DATE OF SERVICE: 05/12/2020  PATIENT:  Ryan [name]  70 y.o. 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  TOURNIQUET:  None  PATIENT DISPOSITION:  PACU - hemodynamically stable.   Delay start of Pharmacological VTE agent (>24hrs) due to surgical blood loss or risk of bleeding: yes  INDICATION FOR PROCEDURE: Ryan [name] is a 70 y.o. male with thrombosed left femoral-popliteal bypass graft. After careful discussion of risks, benefits, and alternatives the patient was offered angiography with initiation of catheter directed thrombolysis. The patient  understood and wished to proceed.  OPERATIVE FINDINGS:  Occluded left fem-pop bypass Confirmed true lumen position distally (51mL contrast) Unremarkable initiation of thrombolysis  DESCRIPTION OF PROCEDURE: After identification of the patient in the pre-operative holding area, the patient was transferred to the operating room. The patient was positioned supine on the operating room table. Anesthesia was induced. The groins was prepped and draped in standard fashion. A surgical pause was performed confirming correct patient, procedure, and operative location.  The right groin was anesthetized with subcutaneous injection of 1% lidocaine. Using ultrasound guidance, the right common femoral artery was accessed with micropuncture technique. Fluoroscopy was used to confirm cannulation over the femoral head.  Sheathogram was not performed. The 78F sheath was upsized to 47F.   An 035 glidewire advantage was advanced into the distal aorta. Over the wire an omni flush catheter was advanced to the level of L2. Aortogram was performed - see above for details.   The left common iliac artery was selected with the 035 glidewire advantage. The wire was advanced into the common femoral artery. Over the wire the omni flush catheter was advanced into the external iliac artery. Selective angiography was performed - see above for details.   The decision was made to intervene. The patient was heparinized with 7000 units of heparin. The sheath was exchanged for a 67F x 45cm sheath. Selective angiography of the left lower extremity was performed prior to intervention. A thrombolysis catheter was placed across the origin of the bypass in the common femoral artery.  Upon completion of the case instrument and sharps counts were confirmed correct. The patient was transferred to the ICU in good condition. I was present for all portions of the procedure.  PLAN: start thrombolysis with TPA @ 0.5mg  / hr. Continue non-titrating heparin dose via the sheath at 800 units / hour. Start therapeutic heparin infusion as directed by pharmacy through patient's peripheral IV. Check fibrinogen and BMP Q6 hours. Return to cath lab tomorrow for catheter check.  [name], MD [facility] Phone Number: [phone] 05/12/2020 3:26 PM

## 2020-05-12 NOTE — Plan of Care (Signed)
  Problem: Education: Goal: Knowledge of General Education information will improve Description: Including pain rating scale, medication(s)/side effects and non-pharmacologic comfort measures Outcome: Progressing   Problem: Clinical Measurements: Goal: Ability to maintain clinical measurements within normal limits will improve Outcome: Progressing   Problem: Clinical Measurements: Goal: Will remain free from infection Outcome: Progressing   Problem: Clinical Measurements: Goal: Cardiovascular complication will be avoided Outcome: Progressing   Problem: Activity: Goal: Risk for activity intolerance will decrease Outcome: Progressing   Problem: Coping: Goal: Level of anxiety will decrease Outcome: Progressing   Problem: Elimination: Goal: Will not experience complications related to bowel motility Outcome: Progressing   Problem: Pain Managment: Goal: General experience of comfort will improve Outcome: Progressing   Problem: Safety: Goal: Ability to remain free from injury will improve Outcome: Progressing   Problem: Skin Integrity: Goal: Risk for impaired skin integrity will decrease Outcome: Progressing   Problem: Education: Goal: Understanding of CV disease, CV risk reduction, and recovery process will improve Outcome: Progressing

## 2020-05-13 ENCOUNTER — Encounter (HOSPITAL_COMMUNITY): Payer: Self-pay | Admitting: Vascular Surgery

## 2020-05-13 ENCOUNTER — Encounter (HOSPITAL_COMMUNITY)
Admission: RE | Disposition: A | Discharge status: Home / Self Care | Payer: Self-pay | Source: Home / Self Care | Attending: Vascular Surgery

## 2020-05-13 ENCOUNTER — Ambulatory Visit: Payer: Medicare Other | Admitting: Sports Medicine

## 2020-05-13 ENCOUNTER — Ambulatory Visit (HOSPITAL_COMMUNITY): Admission: RE | Admit: 2020-05-13 | Payer: Medicare Other | Source: Home / Self Care | Admitting: Vascular Surgery

## 2020-05-13 DIAGNOSIS — I70202 Unspecified atherosclerosis of native arteries of extremities, left leg: Secondary | ICD-10-CM

## 2020-05-13 DIAGNOSIS — T82858A Stenosis of vascular prosthetic devices, implants and grafts, initial encounter: Secondary | ICD-10-CM

## 2020-05-13 HISTORY — PX: PERIPHERAL VASCULAR INTERVENTION: CATH118257

## 2020-05-13 HISTORY — PX: PERIPHERAL VASCULAR THROMBECTOMY: CATH118306

## 2020-05-13 LAB — CBC
HCT: 42.5 % (ref 39.0–52.0)
HCT: 39.7 % (ref 39.0–52.0)
Hemoglobin: 13.3 g/dL (ref 13.0–17.0)
Hemoglobin: 12.2 g/dL — ABNORMAL LOW (ref 13.0–17.0)
MCH: 26.7 pg (ref 26.0–34.0)
MCH: 26.6 pg (ref 26.0–34.0)
MCHC: 31.3 g/dL (ref 30.0–36.0)
MCHC: 30.7 g/dL (ref 30.0–36.0)
MCV: 85.3 fL (ref 80.0–100.0)
MCV: 86.5 fL (ref 80.0–100.0)
Platelets: 259 10*3/uL (ref 150–400)
Platelets: 220 10*3/uL (ref 150–400)
RBC: 4.98 MIL/uL (ref 4.22–5.81)
RBC: 4.59 MIL/uL (ref 4.22–5.81)
RDW: 16.5 % — ABNORMAL HIGH (ref 11.5–15.5)
RDW: 16.4 % — ABNORMAL HIGH (ref 11.5–15.5)
WBC: 7.4 10*3/uL (ref 4.0–10.5)
WBC: 7.6 10*3/uL (ref 4.0–10.5)
nRBC: 0 % (ref 0.0–0.2)
nRBC: 0 % (ref 0.0–0.2)

## 2020-05-13 LAB — BASIC METABOLIC PANEL
Anion gap: 7 (ref 5–15)
Anion gap: 6 (ref 5–15)
BUN: 45 mg/dL — ABNORMAL HIGH (ref 8–23)
BUN: 49 mg/dL — ABNORMAL HIGH (ref 8–23)
CO2: 18 mmol/L — ABNORMAL LOW (ref 22–32)
CO2: 17 mmol/L — ABNORMAL LOW (ref 22–32)
Calcium: 8 mg/dL — ABNORMAL LOW (ref 8.9–10.3)
Calcium: 7.5 mg/dL — ABNORMAL LOW (ref 8.9–10.3)
Chloride: 108 mmol/L (ref 98–111)
Chloride: 111 mmol/L (ref 98–111)
Creatinine, Ser: 2.42 mg/dL — ABNORMAL HIGH (ref 0.61–1.24)
Creatinine, Ser: 2.24 mg/dL — ABNORMAL HIGH (ref 0.61–1.24)
GFR, Estimated: 28 mL/min — ABNORMAL LOW (ref >60)
GFR, Estimated: 31 mL/min — ABNORMAL LOW (ref >60)
Glucose, Bld: 103 mg/dL — ABNORMAL HIGH (ref 70–99)
Glucose, Bld: 104 mg/dL — ABNORMAL HIGH (ref 70–99)
Potassium: 4.2 mmol/L (ref 3.5–5.1)
Potassium: 5.1 mmol/L (ref 3.5–5.1)
Sodium: 133 mmol/L — ABNORMAL LOW (ref 135–145)
Sodium: 134 mmol/L — ABNORMAL LOW (ref 135–145)

## 2020-05-13 LAB — FIBRINOGEN
Fibrinogen: 338 mg/dL (ref 210–475)
Fibrinogen: 361 mg/dL (ref 210–475)

## 2020-05-13 LAB — POCT ACTIVATED CLOTTING TIME: Activated Clotting Time: 243 seconds

## 2020-05-13 LAB — HEPARIN LEVEL (UNFRACTIONATED): Heparin Unfractionated: 0.41 IU/mL (ref 0.30–0.70)

## 2020-05-13 LAB — MRSA PCR SCREENING: MRSA by PCR: NEGATIVE

## 2020-05-13 SURGERY — PERIPHERAL VASCULAR THROMBECTOMY
Anesthesia: LOCAL

## 2020-05-13 MED ORDER — HEPARIN (PORCINE) IN NACL 1000-0.9 UT/500ML-% IV SOLN
INTRAVENOUS | Status: DC | PRN
  Administered 2020-05-13 (×2): 500 mL

## 2020-05-13 MED ORDER — FENTANYL CITRATE (PF) 100 MCG/2ML IJ SOLN
INTRAMUSCULAR | Status: AC
  Filled 2020-05-13: qty 2

## 2020-05-13 MED ORDER — SODIUM CHLORIDE 0.9 % IV SOLN: 250.0000 mL | INTRAVENOUS | Status: DC | PRN

## 2020-05-13 MED ORDER — SODIUM CHLORIDE 0.9 % IV SOLN: INTRAVENOUS | Status: AC

## 2020-05-13 MED ORDER — CLOPIDOGREL BISULFATE 75 MG PO TABS
75.0000 mg | ORAL_TABLET | Freq: Every day | ORAL | Status: DC
  Administered 2020-05-14 – 2020-05-15 (×2): 75 mg via ORAL
  Filled 2020-05-13 (×2): qty 1

## 2020-05-13 MED ORDER — HEPARIN (PORCINE) IN NACL 1000-0.9 UT/500ML-% IV SOLN
INTRAVENOUS | Status: AC
  Filled 2020-05-13: qty 1000

## 2020-05-13 MED ORDER — HYDRALAZINE HCL 20 MG/ML IJ SOLN: 5.0000 mg | INTRAMUSCULAR | Status: DC | PRN

## 2020-05-13 MED ORDER — CHLORHEXIDINE GLUCONATE CLOTH 2 % EX PADS
6.0000 | MEDICATED_PAD | Freq: Every day | CUTANEOUS | Status: DC
  Administered 2020-05-13: 6 via TOPICAL

## 2020-05-13 MED ORDER — HEPARIN SODIUM (PORCINE) 1000 UNIT/ML IJ SOLN
INTRAMUSCULAR | Status: AC
  Filled 2020-05-13: qty 1

## 2020-05-13 MED ORDER — LABETALOL HCL 5 MG/ML IV SOLN: 10.0000 mg | INTRAVENOUS | Status: DC | PRN

## 2020-05-13 MED ORDER — HEPARIN (PORCINE) 25000 UT/250ML-% IV SOLN
1200.0000 [IU]/h | INTRAVENOUS | Status: DC
  Administered 2020-05-14: 800 [IU]/h via INTRAVENOUS
  Filled 2020-05-13 (×2): qty 250

## 2020-05-13 MED ORDER — ASPIRIN EC 81 MG PO TBEC
81.0000 mg | DELAYED_RELEASE_TABLET | Freq: Every day | ORAL | Status: DC
  Administered 2020-05-13 – 2020-05-15 (×3): 81 mg via ORAL
  Filled 2020-05-13 (×3): qty 1

## 2020-05-13 MED ORDER — MIDAZOLAM HCL 2 MG/2ML IJ SOLN
INTRAMUSCULAR | Status: AC
  Filled 2020-05-13: qty 2

## 2020-05-13 MED ORDER — SODIUM CHLORIDE 0.9 % IV SOLN: INTRAVENOUS | Status: DC

## 2020-05-13 MED ORDER — ACETAMINOPHEN 325 MG PO TABS
650.0000 mg | ORAL_TABLET | ORAL | Status: DC | PRN
  Administered 2020-05-13 – 2020-05-14 (×2): 650 mg via ORAL
  Filled 2020-05-13: qty 2

## 2020-05-13 MED ORDER — ONDANSETRON HCL 4 MG/2ML IJ SOLN: 4.0000 mg | Freq: Four times a day (QID) | INTRAMUSCULAR | Status: DC | PRN

## 2020-05-13 MED ORDER — SODIUM CHLORIDE 0.9% FLUSH
3.0000 mL | Freq: Two times a day (BID) | INTRAVENOUS | Status: DC
  Administered 2020-05-14: 3 mL via INTRAVENOUS

## 2020-05-13 MED ORDER — FENTANYL CITRATE (PF) 100 MCG/2ML IJ SOLN
INTRAMUSCULAR | Status: DC | PRN
  Administered 2020-05-13: 50 ug via INTRAVENOUS

## 2020-05-13 MED ORDER — SODIUM CHLORIDE 0.9% FLUSH: 3.0000 mL | INTRAVENOUS | Status: DC | PRN

## 2020-05-13 MED ORDER — ATORVASTATIN CALCIUM 10 MG PO TABS
10.0000 mg | ORAL_TABLET | Freq: Every day | ORAL | Status: DC
  Administered 2020-05-13 – 2020-05-15 (×3): 10 mg via ORAL
  Filled 2020-05-13 (×3): qty 1

## 2020-05-13 MED ORDER — HEPARIN SODIUM (PORCINE) 1000 UNIT/ML IJ SOLN
INTRAMUSCULAR | Status: DC | PRN
  Administered 2020-05-13: 1000 [IU] via INTRAVENOUS
  Administered 2020-05-13: 2000 [IU] via INTRAVENOUS

## 2020-05-13 MED ORDER — MIDAZOLAM HCL 2 MG/2ML IJ SOLN
INTRAMUSCULAR | Status: DC | PRN
  Administered 2020-05-13: 1 mg via INTRAVENOUS

## 2020-05-13 MED ORDER — IODIXANOL 320 MG/ML IV SOLN
INTRAVENOUS | Status: DC | PRN
  Administered 2020-05-13: 10 mL

## 2020-05-13 SURGICAL SUPPLY — 17 items
BALL STERLING OTW 2.5X150X150 (BALLOONS) ×1
BALLN STERLING OTW 2.5X150X150 (BALLOONS) ×2
BALLOON STRLNG OTW 2.5X150X150 (BALLOONS) ×2 IMPLANT
CATH SOFT-VU ST 4F 90CM (CATHETERS) ×3 IMPLANT
CATH TEMPO AQUA 5F 100CM (CATHETERS) ×3 IMPLANT
CLOSURE MYNX CONTROL 6F/7F (Vascular Products) ×3 IMPLANT
DCB RANGER 6.0X40 135 (BALLOONS) ×2 IMPLANT
FILTER CO2 0.2 MICRON (VASCULAR PRODUCTS) ×3 IMPLANT
KIT ESSENTIALS PG (KITS) ×3 IMPLANT
KIT PV (KITS) ×3 IMPLANT
RANGER DCB 6.0X40 135 (BALLOONS) ×3
RESERVOIR CO2 (VASCULAR PRODUCTS) ×3 IMPLANT
SET FLUSH CO2 (MISCELLANEOUS) ×3 IMPLANT
SHEATH PINNACLE 6F 10CM (SHEATH) ×3 IMPLANT
TRAY PV CATH (CUSTOM PROCEDURE TRAY) ×3 IMPLANT
WIRE BENTSON .035X145CM (WIRE) ×3 IMPLANT
WIRE G V18X300CM (WIRE) ×3 IMPLANT

## 2020-05-13 NOTE — Progress Notes (Signed)
Patient arrived into 19E14. Vitals stable and Cardiac monitor placed. Entry site level zero and no presence of hematoma. Will continue to monitor.  -Estella Husk, RN

## 2020-05-13 NOTE — Progress Notes (Signed)
ANTICOAGULATION CONSULT NOTE   Pharmacy Consult:  Systemic heparin Indication:  Thrombosed LLE bypass  No Known Allergies  Patient Measurements: Height: (P) 5\' 6"  (167.6 cm) Weight: (P) 63.5 kg (140 lb) IBW/kg (Calculated) : (P) 63.8 Heparin Dosing Weight: 63 kg  Vital Signs: Temp: 98.6 F (37 C) (04/13 2338) Temp Source: Oral (04/13 2338) BP: 95/80 (04/14 0300) Pulse Rate: 76 (04/14 0300)  Labs: Recent Labs    05/12/20 1304 05/12/20 1759 05/13/20 0105  HGB 17.0 14.2 13.3  HCT 50.0 45.9 42.5  PLT  --  291 259  HEPARINUNFRC  --  >1.10* 0.41  CREATININE 2.80* 2.52* 2.42*    Estimated Creatinine Clearance: 24.9 mL/min (A) (by C-G formula based on SCr of 2.42 mg/dL (H)).   Medical History: Past Medical History:  Diagnosis Date  . Hypertension   . PVD (peripheral vascular disease) (HCC)     Assessment: 61 YOM presented with left leg rest pain.  Patient has had several bypasses and thrombectomy.  S/p LLE angiogram with cannulation and initiation of catheter directed thrombolysis on 4/13 and Pharmacy consulted to dose systemic heparin.  Patient is also getting heparin through the intra-arterial sheath and alteplase infusion.  No baseline platelets; was WNL in August 2021.  Heparin level 0.41 (therapeutic) on heparin via intra-arterial sheath at 800 units/hr. No bleeding noted.  Goal of Therapy:  Heparin level 0.3-0.7 units/hr per MD (not 0.2-0.5 units/mL per the consult) Monitor platelets by anticoagulation protocol: Yes   Plan:  Heparin through intra-arterial sheath at 800 units/hr - per Vascular, do not adjust unless level is high without systemic heparin Alteplase infusion at 0.5 mg/hr per MD Daily heparin level and CBC  September 2021, PharmD, BCPS Please see amion for complete clinical pharmacist phone list 05/13/2020, 3:25 AM

## 2020-05-13 NOTE — Progress Notes (Signed)
ANTICOAGULATION CONSULT NOTE - Initial Consult  Pharmacy Consult for Heparin Indication: Thrombosed LLE bypass  No Known Allergies  Patient Measurements: Height: (P) 5\' 6"  (167.6 cm) Weight: (P) 63.5 kg (140 lb) IBW/kg (Calculated) : (P) 63.8 Heparin Dosing Weight: 63.5 kg   Vital Signs: Temp: 98 F (36.7 C) (04/14 1120) Temp Source: Oral (04/14 1120) BP: 124/58 (04/14 1815) Pulse Rate: 94 (04/14 1815)  Labs: Recent Labs    05/12/20 1759 05/13/20 0105 05/13/20 1012  HGB 14.2 13.3 12.2*  HCT 45.9 42.5 39.7  PLT 291 259 220  HEPARINUNFRC >1.10* 0.41  --   CREATININE 2.52* 2.42* 2.24*    Estimated Creatinine Clearance: 26.9 mL/min (A) (by C-G formula based on SCr of 2.24 mg/dL (H)).   Medical History: Past Medical History:  Diagnosis Date  . Hypertension   . PVD (peripheral vascular disease) (HCC)     Assessment: 1 YOM presented with left leg rest pain.  Patient has had several bypasses and thrombectomy.  S/p LLE angiogram with cannulation and initiation of catheter directed thrombolysis on 4/13 and heparin through intra-arterial sheath.  Patient is now s/p lytics and redo of L common femoral bypass. Pharmacy consulted to resume heparin 8 hrs after sheath removal. Sheath removed at ~1730. Patient previously therapeutic on heparin 800 units/hr.   Goal of Therapy:  Heparin level 0.3-0.7 units/ml Monitor platelets by anticoagulation protocol: Yes   Plan:  Resume heparin at 800 units/hr at 0130 on 4/15 Check 8 hr heparin level  Monitor heparin level, CBC and s/s of bleeding daily  5/15, PharmD Clinical Pharmacist  05/13/2020,6:28 PM

## 2020-05-13 NOTE — Op Note (Signed)
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  Thrombolytics catheter check of left lower extremity with left leg arteriogram 2.  Drug-coated balloon angioplasty of proximal bypass (6 mm x 40 mm drug-coated Ranger) 3.  Angioplasty of distal anastomosis, TP trunk, and posterior tibial artery (2.5 mm x 220 mm Sterling) 4.  Mynx closure of the right common femoral artery 5.  51 minutes of monitored moderate conscious sedation  Indications: 70 year old male who has had a redo left common femoral to below-knee prosthetic bypass that has already undergone thrombolysis once in the past.  He presented to the office on Tuesday with evidence of occluded bypass.  Thrombolytics catheter was placed yesterday.  Findings:   Initial CO2 arteriogram of left lower extremity showed a patent bypass after lysis overnight.  The Eluvia stent in the proximal bypass was angioplastied with a 6 mm x 40 mm drug-coated Ranger given there was some difficulty cannulating the bypass here yesterday.  Ultimately had to use limited contrast less than 10 cc to visualize the distal anastomosis of the bypass and runoff.  There appeared to be a high-grade focal calcified stenosis greater than 80% in the TP trunk this was angioplastied with a 2-1/2 mm Sterling.  There was only single-vessel runoff of the peroneal (previous two vessel runoff).  The tibials were very small.  I attempted angioplasty of the entire PT but could not get any flow down the posterior tibial.  He now has a patent bypass graft in the left leg with single-vessel runoff in the peroneal artery.  Brisk peroneal doppler signal at completion.   Procedure:  The patient was identified in the holding area and taken to room 8.  The patient  was then placed supine on the table and prepped and draped in the usual sterile fashion.  A time out was called.  Initially removed the inner cannula from the UniFuse catheter.  I placed a V 18 wire down the lytics catheter and got it across the bypass on the below knee popliteal artery and got the wire down the TP trunk and into the PT artery.  We performed a CO2 aortogram that showed a patent bypass after lysis overnight.  Given difficulty cannulate the bypass I treated the proximal stent in the bypass off the common femoral with a 6 mmx 40 mm drug-coated Ranger to nominal pressure for 3 minutes.  I then went down distally and used very limited contrast less than 10 cc to visualize the distal anastomosis and there was a high-grade stenosis in the TP trunk it was treated with a 2-1/2 mm Sterling and we also noted only single runoff in the peroneal artery and had no flow in the posterior tibial.  I did get a wire down the posterior tibial and and angioplasty of the entire PT with 2.5 mm Sterling but could not get flow in the PT.  Wires and catheters were removed.  Bypass was patent with brisk flow down the peroneal at completion.  A 6 French sheath was placed in the right groin.  Mynx closure device was put in the right groin.  Patient remained stable.  Plan: Patient will be sent to the floor and had Mynx closure of the right common femoral artery and lytics catheter removed.  Will resume heparin in 8 hours with pharmacy  assistance tonight at midnight - high risk bypass.   Cephus Shelling, MD [facility] [place] Office: [phone]

## 2020-05-13 NOTE — Progress Notes (Signed)
[facility] [place]  Subjective  -states his left foot is feeling better.   Objective 101/68 79 98 F (36.7 C) (Oral) 18 99%  Intake/Output Summary (Last 24 hours) at 05/13/2020 1148 Last data filed at 05/13/2020 0800 Gross per 24 hour  Intake 2807.57 ml  Output 1025 ml  Net 1782.57 ml    Right groin sheath clean dry intact Left foot motor intact Left DP PT Doppler signals at the ankle  Laboratory Lab Results: Recent Labs    05/13/20 0105 05/13/20 1012  WBC 7.4 7.6  HGB 13.3 12.2*  HCT 42.5 39.7  PLT 259 220   BMET Recent Labs    05/13/20 0105 05/13/20 1012  NA 133* 134*  K 4.2 5.1  CL 108 111  CO2 18* 17*  GLUCOSE 103* 104*  BUN 45* 49*  CREATININE 2.42* 2.24*  CALCIUM 8.0* 7.5*    COAG Lab Results  Component Value Date   INR 0.97 01/17/2018   INR 0.9 06/16/2008   INR 1.9 (H) 12/31/2007   No results found for: PTT  Assessment/Planning:  70 year old male that underwent thrombolysis overnight in the ICU.  He now has Doppler flow in the left foot with dorsalis pedis and posterior tibial signals.  State the foot feels better.  His kidney function has improved with hydration overnight and presented with AKI yesterday.  We will plan lytics check this afternoon of the left leg bypass.  Cephus Shelling 05/13/2020 11:48 AM --

## 2020-05-14 ENCOUNTER — Encounter (HOSPITAL_COMMUNITY): Payer: Self-pay | Admitting: Vascular Surgery

## 2020-05-14 LAB — BASIC METABOLIC PANEL
Anion gap: 5 (ref 5–15)
BUN: 51 mg/dL — ABNORMAL HIGH (ref 8–23)
CO2: 18 mmol/L — ABNORMAL LOW (ref 22–32)
Calcium: 8 mg/dL — ABNORMAL LOW (ref 8.9–10.3)
Chloride: 113 mmol/L — ABNORMAL HIGH (ref 98–111)
Creatinine, Ser: 1.56 mg/dL — ABNORMAL HIGH (ref 0.61–1.24)
GFR, Estimated: 47 mL/min — ABNORMAL LOW (ref >60)
Glucose, Bld: 160 mg/dL — ABNORMAL HIGH (ref 70–99)
Potassium: 4.3 mmol/L (ref 3.5–5.1)
Sodium: 136 mmol/L (ref 135–145)

## 2020-05-14 LAB — CBC
HCT: 34.7 % — ABNORMAL LOW (ref 39.0–52.0)
Hemoglobin: 10.7 g/dL — ABNORMAL LOW (ref 13.0–17.0)
MCH: 26.7 pg (ref 26.0–34.0)
MCHC: 30.8 g/dL (ref 30.0–36.0)
MCV: 86.5 fL (ref 80.0–100.0)
Platelets: 169 10*3/uL (ref 150–400)
RBC: 4.01 MIL/uL — ABNORMAL LOW (ref 4.22–5.81)
RDW: 16.6 % — ABNORMAL HIGH (ref 11.5–15.5)
WBC: 7.7 10*3/uL (ref 4.0–10.5)
nRBC: 0 % (ref 0.0–0.2)

## 2020-05-14 LAB — HEPARIN LEVEL (UNFRACTIONATED)
Heparin Unfractionated: 0.12 IU/mL — ABNORMAL LOW (ref 0.30–0.70)
Heparin Unfractionated: <0.1 IU/mL — ABNORMAL LOW (ref 0.30–0.70)

## 2020-05-14 MED ORDER — MORPHINE SULFATE (PF) 2 MG/ML IV SOLN
2.0000 mg | INTRAVENOUS | Status: DC | PRN
Start: 2020-05-14 — End: 2020-05-15
  Administered 2020-05-14 (×2): 2 mg via INTRAVENOUS
  Filled 2020-05-14 (×2): qty 1

## 2020-05-14 MED ORDER — OXYCODONE-ACETAMINOPHEN 5-325 MG PO TABS
1.0000 | ORAL_TABLET | Freq: Four times a day (QID) | ORAL | Status: DC | PRN
  Administered 2020-05-14 – 2020-05-15 (×3): 1 via ORAL
  Filled 2020-05-14 (×3): qty 1

## 2020-05-14 NOTE — Evaluation (Signed)
Physical Therapy Evaluation Patient Details Name: Ryan [name] MRN: [id number] DOB: 15-Feb-1950 Today's Date: 05/14/2020   History of Present Illness  pt is a 70 y/o male admitted 4/13 for evaluation of new left leg rest pain.  4/13 Pt s/p aortogram, L LE angiogram with third order cannulation and initiation of thrombolysis.  4/14-- pt s/p angioplasty of femoral to below knee prosthetic bypass.  PMHx: PVD, HTN, fem/pop BPG's with multiple redo/thrombectomies.  Clinical Impression  Pt admitted with/for new L LE pain, s/p successful recannulation.  Pt now needing min guard to supervision assist for mobility/gait.  Expect quick improvements as pain improves.  Pt currently limited functionally due to the problems listed below.  (see problems list.)  Pt will benefit from PT to maximize function and safety to be able to get home safely with available assist .     Follow Up Recommendations No PT follow up;Supervision - Intermittent    Equipment Recommendations  None recommended by PT    Recommendations for Other Services       Precautions / Restrictions Precautions Precautions: Fall (lower fall risk)      Mobility  Bed Mobility Overal bed mobility: Modified Independent                  Transfers Overall transfer level: Needs assistance Equipment used: Rolling walker (2 wheeled) Transfers: Sit to/from Stand Sit to Stand: Supervision         General transfer comment: utilizes UE's appropriately  Ambulation/Gait Ambulation/Gait assistance: Supervision Gait Distance (Feet): 200 Feet Assistive device: Rolling walker (2 wheeled) Gait Pattern/deviations: Step-through pattern;Step-to pattern   Gait velocity interpretation: 1.31 - 2.62 ft/sec, indicative of limited community ambulator General Gait Details: mildly antalgic, but stable and safe with the RW.  Improved pattern/step length with time up and decreased stiffness.  Stairs            Wheelchair Mobility     Modified Rankin (Stroke Patients Only)       Balance Overall balance assessment: No apparent balance deficits (not formally assessed)                                           Pertinent Vitals/Pain Pain Assessment: Faces Faces Pain Scale: Hurts little more Pain Location: L foot/leg Pain Descriptors / Indicators: Discomfort;Numbness;Throbbing Pain Intervention(s): Monitored during session;Premedicated before session    Home Living Family/patient expects to be discharged to:: Private residence Living Arrangements: Spouse/significant other Available Help at Discharge: Family;Available 24 hours/day Type of Home: House Home Access: Level entry     Home Layout: One level Home Equipment: Walker - 2 wheels;Cane - single point;Bedside commode;Shower seat      Prior Function Level of Independence: Independent               Hand Dominance        Extremity/Trunk Assessment   Upper Extremity Assessment Upper Extremity Assessment: Overall WFL for tasks assessed    Lower Extremity Assessment Lower Extremity Assessment: Overall WFL for tasks assessed;LLE deficits/detail LLE Deficits / Details: painful, neuropathy, able to w/bear functionally    Cervical / Trunk Assessment Cervical / Trunk Assessment: Normal  Communication   Communication: No difficulties  Cognition Arousal/Alertness: Awake/alert Behavior During Therapy: WFL for tasks assessed/performed Overall Cognitive Status: Within Functional Limits for tasks assessed  General Comments General comments (skin integrity, edema, etc.): vss on RA sats in mid 90's    Exercises     Assessment/Plan    PT Assessment Patient needs continued PT services  PT Problem List Decreased activity tolerance;Decreased mobility;Decreased knowledge of use of DME;Decreased knowledge of precautions;Pain       PT Treatment Interventions DME  instruction;Gait training;Stair training;Functional mobility training;Therapeutic activities;Patient/family education    PT Goals (Current goals can be found in the Care Plan section)  Acute Rehab PT Goals Patient Stated Goal: home independent PT Goal Formulation: With patient Time For Goal Achievement: 05/21/20 Potential to Achieve Goals: Good    Frequency Min 4X/week   Barriers to discharge        Co-evaluation               AM-PAC PT "6 Clicks" Mobility  Outcome Measure Help needed turning from your back to your side while in a flat bed without using bedrails?: None Help needed moving from lying on your back to sitting on the side of a flat bed without using bedrails?: None Help needed moving to and from a bed to a chair (including a wheelchair)?: None Help needed standing up from a chair using your arms (e.g., wheelchair or bedside chair)?: A Little Help needed to walk in hospital room?: A Little Help needed climbing 3-5 steps with a railing? : A Little 6 Click Score: 21    End of Session   Activity Tolerance: Patient tolerated treatment well Patient left: in bed;with call bell/phone within reach Nurse Communication: Mobility status PT Visit Diagnosis: Other abnormalities of gait and mobility (R26.89);Pain;Difficulty in walking, not elsewhere classified (R26.2) Pain - Right/Left: Left Pain - part of body: Leg;Ankle and joints of foot    Time: 1350-1410 PT Time Calculation (min) (ACUTE ONLY): 20 min   Charges:   PT Evaluation $PT Eval Low Complexity: 1 Low          05/14/2020  Jacinto Halim., PT Acute Rehabilitation Services [phone]  (pager) [phone]  (office)  Eliseo Gum Wyman Meschke 05/14/2020, 2:26 PM

## 2020-05-14 NOTE — Progress Notes (Signed)
ANTICOAGULATION CONSULT NOTE - Initial Consult  Pharmacy Consult for Heparin Indication: Thrombosed LLE bypass  No Known Allergies  Patient Measurements: Height: 5\' 6"  (167.6 cm) Weight: 67.9 kg (149 lb 11.1 oz) IBW/kg (Calculated) : 63.8 Heparin Dosing Weight: 63.5 kg   Vital Signs: Temp: 97.8 F (36.6 C) (04/15 0310) Temp Source: Oral (04/15 0310) BP: 126/65 (04/15 0830) Pulse Rate: 74 (04/15 0830)  Labs: Recent Labs    05/12/20 1759 05/13/20 0105 05/13/20 1012 05/14/20 0306 05/14/20 0905  HGB 14.2 13.3 12.2* 10.7*  --   HCT 45.9 42.5 39.7 34.7*  --   PLT 291 259 220 169  --   HEPARINUNFRC >1.10* 0.41  --   --  0.12*  CREATININE 2.52* 2.42* 2.24*  --  1.56*    Estimated Creatinine Clearance: 39.2 mL/min (A) (by C-G formula based on SCr of 1.56 mg/dL (H)).   Medical History: Past Medical History:  Diagnosis Date  . Hypertension   . PVD (peripheral vascular disease) (HCC)     Assessment: 42 YOM presented with left leg rest pain.  Patient has had several bypasses and thrombectomy.  S/p LLE angiogram with cannulation and initiation of catheter directed thrombolysis on 4/13 and heparin through intra-arterial sheath.  Patient is now s/p lytics and redo of L common femoral bypass. Heparin resumed post procedure. Level low this morning. No overt bleeding or IV issues noted.   Goal of Therapy:  Heparin level 0.3-0.7 units/ml Monitor platelets by anticoagulation protocol: Yes   Plan:  Increase heparin to 950 units/hr Check 8 hr heparin level  Monitor heparin level, CBC and s/s of bleeding daily  5/13 PharmD., BCPS Clinical Pharmacist 05/14/2020 11:46 AM

## 2020-05-14 NOTE — Progress Notes (Addendum)
  Progress Note    05/14/2020 7:25 AM 1 Day Post-Op  Subjective:  Says his left foot is painful but better than when he came into the hospital  Afebrile HR 70's-100's 90's-120's systolic 96% RA  Vitals:   05/14/20 0310 05/14/20 0600  BP: (!) 97/57   Pulse: 80 72  Resp: 14 15  Temp: 97.8 F (36.6 C)   SpO2: 96% 94%    Physical Exam: Cardiac:  regular Lungs:  Non labored Extremities:  Brisk doppler signals left DP/PT/peroneal.  Left foot warm   CBC    Component Value Date/Time   WBC 7.7 05/14/2020 0306   RBC 4.01 (L) 05/14/2020 0306   HGB 10.7 (L) 05/14/2020 0306   HCT 34.7 (L) 05/14/2020 0306   PLT 169 05/14/2020 0306   MCV 86.5 05/14/2020 0306   MCH 26.7 05/14/2020 0306   MCHC 30.8 05/14/2020 0306   RDW 16.6 (H) 05/14/2020 0306    BMET    Component Value Date/Time   NA 134 (L) 05/13/2020 1012   K 5.1 05/13/2020 1012   CL 111 05/13/2020 1012   CO2 17 (L) 05/13/2020 1012   GLUCOSE 104 (H) 05/13/2020 1012   BUN 49 (H) 05/13/2020 1012   CREATININE 2.24 (H) 05/13/2020 1012   CALCIUM 7.5 (L) 05/13/2020 1012   GFRNONAA 31 (L) 05/13/2020 1012   GFRAA >60 09/23/2019 0455    INR    Component Value Date/Time   INR 0.97 01/17/2018 0950     Intake/Output Summary (Last 24 hours) at 05/14/2020 0725 Last data filed at 05/14/2020 0600 Gross per 24 hour  Intake 2652.51 ml  Output 1450 ml  Net 1202.51 ml     Assessment:  70 y.o. male is s/p:  Thrombolysis check with Drug-coated balloon angioplasty of proximal bypass (6 mm x 40 mm drug-coated [place]) and Angioplasty of distal anastomosis, TP trunk, and posterior tibial artery (2.5 mm x 220 mm [place]) and mynx closure of the right CFA 1 Day Post-Op  Plan: -pt with brisk doppler signals left DP/PT/peroneal. -pt with pain in left foot but better then when he came into hospital -creatinine yesterday 2.24-will check stat bmp today to check renal function. -has only mobilized to chair-will get PT to work with  pt -DVT prophylaxis:  Heparin gtt -PDMP reviewed.  Pt received 15 percocet day before hospitalization.  Will not prescribe pain medication at discharge.    Doreatha Massed, PA-C [facility] [phone] 05/14/2020 7:25 AM   I have independently interviewed and examined patient and agree with PA assessment and plan above.  Renal function improving on recheck BMP.  Left lower extremity is edematous although compartments are soft.  We will get pain controlled and hopefully discharge over the weekend  [place] C. Randie Heinz, MD [facility] [place] Office: [phone] Pager: [phone]

## 2020-05-14 NOTE — Discharge Instructions (Signed)
° °  [facility] [place] ° °Discharge Instructions ° °Lower Extremity Angiogram; Angioplasty/Stenting ° °Please refer to the following instructions for your post-procedure care. Your surgeon or physician assistant will discuss any changes with you. ° °Activity ° °Avoid lifting more than 8 pounds (1 gallons of milk) for 72 hours (3 days) after your procedure. You may walk as much as you can tolerate. It's OK to drive after 72 hours. ° °Bathing/Showering ° °You may shower the day after your procedure. If you have a bandage, you may remove it at 24- 48 hours. Clean your incision site with mild soap and water. Pat the area dry with a clean towel. ° °Diet ° °Resume your pre-procedure diet. There are no special food restrictions following this procedure. All patients with peripheral vascular disease should follow a low fat/low cholesterol diet. In order to heal from your surgery, it is CRITICAL to get adequate nutrition. Your body requires vitamins, minerals, and protein. Vegetables are the best source of vitamins and minerals. Vegetables also provide the perfect balance of protein. Processed food has little nutritional value, so try to avoid this. ° °Medications ° °Resume taking all of your medications unless your doctor tells you not to. If your incision is causing pain, you may take over-the-counter pain relievers such as acetaminophen (Tylenol) ° °Follow Up ° °Follow up will be arranged at the time of your procedure. You may have an office visit scheduled or may be scheduled for surgery. Ask your surgeon if you have any questions. ° °Please call us immediately for any of the following conditions: °•Severe or worsening pain your legs or feet at rest or with walking. °•Increased pain, redness, drainage at your groin puncture site. °•Fever of 101 degrees or higher. °•If you have any mild or slow bleeding from your puncture site: lie down, apply firm constant pressure over the area with a piece of  gauze or a clean wash cloth for 30 minutes- no peeking!, call 911 right away if you are still bleeding after 30 minutes, or if the bleeding is heavy and unmanageable. ° °Reduce your risk factors of vascular disease: ° °Stop smoking. If you would like help call [facility] at 1-800-QUIT-NOW (1-800-784-8669) or [place] at 336-586-4000. °Manage your cholesterol °Maintain a desired weight °Control your diabetes °Keep your blood pressure down ° °If you have any questions, please call the office at [phone] ° °

## 2020-05-14 NOTE — Progress Notes (Signed)
ANTICOAGULATION CONSULT NOTE - Initial Consult  Pharmacy Consult for Heparin Indication: Thrombosed LLE bypass  No Known Allergies  Patient Measurements: Height: 5\' 6"  (167.6 cm) Weight: 67.9 kg (149 lb 11.1 oz) IBW/kg (Calculated) : 63.8 Heparin Dosing Weight: 63.5 kg   Vital Signs: Temp: 98 F (36.7 C) (04/15 2013) Temp Source: Oral (04/15 2013) BP: 118/68 (04/15 2013) Pulse Rate: 76 (04/15 2013)  Labs: Recent Labs    05/13/20 0105 05/13/20 1012 05/14/20 0306 05/14/20 0905 05/14/20 1942  HGB 13.3 12.2* 10.7*  --   --   HCT 42.5 39.7 34.7*  --   --   PLT 259 220 169  --   --   HEPARINUNFRC 0.41  --   --  0.12* <0.10*  CREATININE 2.42* 2.24*  --  1.56*  --     Estimated Creatinine Clearance: 39.2 mL/min (A) (by C-G formula based on SCr of 1.56 mg/dL (H)).   Medical History: Past Medical History:  Diagnosis Date  . Hypertension   . PVD (peripheral vascular disease) (HCC)     Assessment: 70 YOM presented with left leg rest pain.  Patient has had several bypasses and thrombectomy.  S/p LLE angiogram with cannulation and initiation of catheter directed thrombolysis on 4/13 and heparin through intra-arterial sheath.  Patient is now s/p lytics and redo of L common femoral bypass. Heparin resumed post procedure. Level low this morning. No overt bleeding or IV issues noted.   PM update: HL undetectable. No issues with IV infusion per RN. No bleeding noted. Will increase rate.   Goal of Therapy:  Heparin level 0.3-0.7 units/ml Monitor platelets by anticoagulation protocol: Yes   Plan:  Increase heparin to 1200 units/hr Check 8 hr heparin level  Monitor heparin level, CBC and s/s of bleeding daily  Derian Dimalanta A. 5/13, PharmD, BCPS, FNKF Clinical Pharmacist [place] Please utilize Amion for appropriate phone number to reach the unit pharmacist [facility])   05/14/2020 9:00 PM

## 2020-05-15 LAB — CBC
HCT: 31.7 % — ABNORMAL LOW (ref 39.0–52.0)
Hemoglobin: 10 g/dL — ABNORMAL LOW (ref 13.0–17.0)
MCH: 27 pg (ref 26.0–34.0)
MCHC: 31.5 g/dL (ref 30.0–36.0)
MCV: 85.7 fL (ref 80.0–100.0)
Platelets: 183 10*3/uL (ref 150–400)
RBC: 3.7 MIL/uL — ABNORMAL LOW (ref 4.22–5.81)
RDW: 16.5 % — ABNORMAL HIGH (ref 11.5–15.5)
WBC: 5.7 10*3/uL (ref 4.0–10.5)
nRBC: 0 % (ref 0.0–0.2)

## 2020-05-15 LAB — HEPARIN LEVEL (UNFRACTIONATED)
Heparin Unfractionated: 0.42 IU/mL (ref 0.30–0.70)
Heparin Unfractionated: 0.58 IU/mL (ref 0.30–0.70)

## 2020-05-15 MED ORDER — APIXABAN 5 MG PO TABS
5.0000 mg | ORAL_TABLET | Freq: Two times a day (BID) | ORAL | 3 refills | Status: DC
  Filled 2020-05-18: qty 60, 30d supply, fill #0

## 2020-05-15 NOTE — Progress Notes (Signed)
Physical Therapy Treatment Patient Details Name: Ryan [name] MRN: [id number] DOB: Apr 14, 1950 Today's Date: 05/15/2020    History of Present Illness pt is a 70 y/o male admitted 4/13 for evaluation of new left leg rest pain.  4/13 Pt s/p aortogram, L LE angiogram with third order cannulation and initiation of thrombolysis.  4/14-- pt s/p angioplasty of femoral to below knee prosthetic bypass.  PMHx: PVD, HTN, fem/pop BPG's with multiple redo/thrombectomies.    PT Comments    Pt received in bed, willing to participate in PT. Generally supervision for OOB mobility. Steady with RW with no overt LOB, but cueing to weight bear through heel and for pacing as pt reporting "he's fine" although body language demonstrating fatigue. Minimal cueing for navigation for RW as pt became more fatigued. Pt left in chair with all needs met and call bell within reach. Will continue to follow acutely.    Follow Up Recommendations  No PT follow up;Supervision - Intermittent     Equipment Recommendations  None recommended by PT    Recommendations for Other Services       Precautions / Restrictions Precautions Precautions: Fall (lower fall risk) Restrictions Weight Bearing Restrictions: Yes LLE Weight Bearing: Weight bearing as tolerated Other Position/Activity Restrictions: WBAT with darco shoe, WB through heel without darco shoe    Mobility  Bed Mobility Overal bed mobility: Modified Independent                  Transfers Overall transfer level: Needs assistance Equipment used: Rolling walker (2 wheeled) Transfers: Sit to/from Stand Sit to Stand: Supervision         General transfer comment: cueing for hand placement  Ambulation/Gait Ambulation/Gait assistance: Supervision Gait Distance (Feet): 220 Feet Assistive device: Rolling walker (2 wheeled) Gait Pattern/deviations: Step-to pattern;Decreased stride length;Decreased weight shift to left Gait velocity: decreased   General  Gait Details: Steady with RW, but increased cueing to maintain WB precaution without shoe as pt began to fatigue   Stairs             Wheelchair Mobility    Modified Rankin (Stroke Patients Only)       Balance Overall balance assessment: Needs assistance   Sitting balance-Leahy Scale: Good       Standing balance-Leahy Scale: Fair Standing balance comment: Benefits from UE support for mobility, especially without darco shoe                            Cognition Arousal/Alertness: Awake/alert Behavior During Therapy: WFL for tasks assessed/performed Overall Cognitive Status: Within Functional Limits for tasks assessed                                        Exercises      General Comments        Pertinent Vitals/Pain Pain Assessment: Faces Faces Pain Scale: Hurts little more Pain Location: L foot/leg Pain Descriptors / Indicators: Discomfort;Numbness;Throbbing Pain Intervention(s): Monitored during session;Repositioned    Home Living                      Prior Function            PT Goals (current goals can now be found in the care plan section) Acute Rehab PT Goals Patient Stated Goal: home independent PT Goal Formulation: With patient Time For Goal  Achievement: 05/21/20 Potential to Achieve Goals: Good Progress towards PT goals: Progressing toward goals    Frequency    Min 3X/week      PT Plan      Co-evaluation              AM-PAC PT "6 Clicks" Mobility   Outcome Measure  Help needed turning from your back to your side while in a flat bed without using bedrails?: None Help needed moving from lying on your back to sitting on the side of a flat bed without using bedrails?: None Help needed moving to and from a bed to a chair (including a wheelchair)?: None Help needed standing up from a chair using your arms (e.g., wheelchair or bedside chair)?: A Little Help needed to walk in hospital room?: A  Little Help needed climbing 3-5 steps with a railing? : A Little 6 Click Score: 21    End of Session Equipment Utilized During Treatment: Gait belt Activity Tolerance: Patient tolerated treatment well Patient left: with call bell/phone within reach;in chair Nurse Communication: Mobility status PT Visit Diagnosis: Other abnormalities of gait and mobility (R26.89);Pain;Difficulty in walking, not elsewhere classified (R26.2) Pain - Right/Left: Left Pain - part of body: Leg;Ankle and joints of foot     Time:  -     Charges:                        Rosita Kea, SPT

## 2020-05-15 NOTE — Progress Notes (Addendum)
   VASCULAR SURGERY ASSESSMENT & PLAN:   PAD: Occluded left common femoral to below-knee prosthetic bypass (previous re-do bypass) 05/14/2020: Thrombolytics catheter check of left lower extremity with left leg arteriogram, drug-coated balloon angioplasty of proximal bypass, angioplasty of distal anastomosis, TP trunk, and posterior tibial artery. LLE well perfused. Mild edema. VSS. Afebrile. Remains on heparin infusion. Therapeutic level.  High risk bypass for thrombosis. Will need DOAC prior to DC. I will ask TOC to assess resources for drug.   On aspirin, Plavix and statin  Left 5th toe ulcer: recommend gauze floss and Darco shoe.   Mild acute blood loss anemia: H and H stable. Platelet count 183k  AKI: Creatinine nearly at baseline with excellent UOP.  SUBJECTIVE:   Primary complaint is left 4th and 5th toe pain with ambulation. PT cleared him for DC yesterday.  PHYSICAL EXAM:   Vitals:   05/14/20 1340 05/14/20 2013 05/14/20 2246 05/15/20 0326  BP: 140/78 118/68 118/68 114/63  Pulse: 83 76 72 73  Resp: 17 16 14 11   Temp:  98 F (36.7 C) 98.2 F (36.8 C) 98.1 F (36.7 C)  TempSrc:  Oral Oral Oral  SpO2: 98% 97% 97% 98%  Weight:      Height:       General appearance: Awake, alert in no apparent distress Cardiac: Heart rate and rhythm are regular Respirations: Nonlabored Left Groin: without bleeding or hematoma Extremities: Both feet are warm with intact sensation and motor function.  Dampened left dorsalis pedis, brisk posterior tibial and peroneal artery Doppler signals. Ischemic skin ulcer of pad of left 5th toe. Mild lower leg/foot edema.      LABS:   Lab Results  Component Value Date   WBC 5.7 05/15/2020   HGB 10.0 (L) 05/15/2020   HCT 31.7 (L) 05/15/2020   MCV 85.7 05/15/2020   PLT 183 05/15/2020   Lab Results  Component Value Date   CREATININE 1.56 (H) 05/14/2020   Lab Results  Component Value Date   INR 0.97 01/17/2018   CBG (last 3)  No results  for input(s): GLUCAP in the last 72 hours.  PROBLEM LIST:    Active Problems:   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HCC)   Thrombosis of femoro-popliteal bypass graft (HCC)   CURRENT MEDS:   . aspirin EC  81 mg Oral Daily  . atorvastatin  10 mg Oral Daily  . clopidogrel  75 mg Oral Q breakfast  . sodium chloride flush  3 mL Intravenous Q12H    01/19/2018, Milinda Antis Office: [phone] 05/15/2020   I have independently interviewed and examined patient and agree with PA assessment and plan above.  We will transition to DOAC for high risk bypass and will discharge with Plavix and statin as well.  Discharge pending ability to secure home meds as stated above.  Cleve Paolillo C. 05/17/2020, MD [facility] [place] Office: [phone] Pager: [phone]

## 2020-05-15 NOTE — Progress Notes (Signed)
ANTICOAGULATION CONSULT NOTE  Pharmacy Consult for Heparin Indication: Thrombosed LLE bypass  No Known Allergies  Patient Measurements: Height: 5\' 6"  (167.6 cm) Weight: 67.9 kg (149 lb 11.1 oz) IBW/kg (Calculated) : 63.8 Heparin Dosing Weight: 63.5 kg   Vital Signs: Temp: 98.1 F (36.7 C) (04/16 0326) Temp Source: Oral (04/16 0326) BP: 114/63 (04/16 0326) Pulse Rate: 73 (04/16 0326)  Labs: Recent Labs    05/13/20 0105 05/13/20 1012 05/14/20 0306 05/14/20 0905 05/14/20 1942 05/15/20 0458  HGB 13.3 12.2* 10.7*  --   --  10.0*  HCT 42.5 39.7 34.7*  --   --  31.7*  PLT 259 220 169  --   --  183  HEPARINUNFRC 0.41  --   --  0.12* <0.10* 0.42  CREATININE 2.42* 2.24*  --  1.56*  --   --     Estimated Creatinine Clearance: 39.2 mL/min (A) (by C-G formula based on SCr of 1.56 mg/dL (H)).  Assessment: 47 YOM presented with left leg rest pain.  Patient has had several bypasses and thrombectomy.  S/p LLE angiogram with cannulation and initiation of catheter directed thrombolysis on 4/13 and heparin through intra-arterial sheath.  Patient is now s/p lytics and redo of L common femoral bypass. Heparin resumed post procedure.   Heparin level therapeutic (0.42) on gtt at 1200 units/hr.   Goal of Therapy:  Heparin level 0.3-0.7 units/ml Monitor platelets by anticoagulation protocol: Yes   Plan:  Continue heparin at 1200 units/hr Check 6 hr heparin level to confirm therapeutic   5/13, PharmD, BCPS Please see amion for complete clinical pharmacist phone list 05/15/2020 6:05 AM

## 2020-05-15 NOTE — Progress Notes (Signed)
ANTICOAGULATION CONSULT NOTE  Pharmacy Consult for Heparin Indication: Thrombosed LLE bypass  No Known Allergies  Patient Measurements: Height: 5\' 6"  (167.6 cm) Weight: 67.9 kg (149 lb 11.1 oz) IBW/kg (Calculated) : 63.8 Heparin Dosing Weight: 63.5 kg   Vital Signs: Temp: 97.9 F (36.6 C) (04/16 1227) Temp Source: Oral (04/16 1227) BP: 137/61 (04/16 0908) Pulse Rate: 62 (04/16 0908)  Labs: Recent Labs    05/13/20 0105 05/13/20 1012 05/14/20 0306 05/14/20 0905 05/14/20 1942 05/15/20 0458 05/15/20 1221  HGB 13.3 12.2* 10.7*  --   --  10.0*  --   HCT 42.5 39.7 34.7*  --   --  31.7*  --   PLT 259 220 169  --   --  183  --   HEPARINUNFRC 0.41  --   --  0.12* <0.10* 0.42 0.58  CREATININE 2.42* 2.24*  --  1.56*  --   --   --     Estimated Creatinine Clearance: 39.2 mL/min (A) (by C-G formula based on SCr of 1.56 mg/dL (H)).  Assessment: 7 YOM presented with left leg rest pain.  Patient has had several bypasses and thrombectomy.  S/p LLE angiogram with cannulation and initiation of catheter directed thrombolysis on 4/13 and heparin through intra-arterial sheath.  Patient is now s/p lytics and redo of L common femoral bypass. Heparin resumed post procedure.   Heparin level continues to be therapeutic (0.58) on gtt at 1200 units/hr. Hgb down post op, PLTs stable. Plans to transition to oral AC prior to DC.   Goal of Therapy:  Heparin level 0.3-0.7 units/ml Monitor platelets by anticoagulation protocol: Yes   Plan:  Continue heparin at 1200 units/hr Daily heparin level, CBC  5/13, PharmD PGY1 Acute Care Pharmacy Resident 05/15/2020 1:38 PM  Please check [email or web address] for unit specific pharmacy phone numbers.

## 2020-05-16 NOTE — Discharge Summary (Signed)
Bypass Discharge Summary Patient ID: Ryan [name] [id number] 70 y.o. 13-Dec-1950  Admit date: 05/12/2020  Discharge date and time: 05/15/2020  3:00 PM   Admitting Physician: Leonie Douglas, MD   Discharge Physician: [name], MD  Admission Diagnoses: 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HCC) [I74.3] Thrombosis of femoro-popliteal bypass graft [facility]) [T55.732K]  Discharge Diagnoses: 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HCC) [I74.3] Thrombosis of femoro-popliteal bypass graft (HCC) [G25.427C]; AKI  Admission Condition: good  Discharged Condition: good  Indication for Admission: Occluded left common femoral to below-knee popliteal bypass  Hospital Course: The patient presented to the clinic with complaints of left lower extremity pain for several days and had evidence of occluded left common femoral to below-knee popliteal bypass.  Arrangements were made for thrombolysis.  The following day he was taken to the cardiac catheterization lab and underwent left lower extremity angiogram with third order cannulation and initiation of catheter directed thrombolysis.  Heparin infusion was initiated.  An increasing serum creatinine of approximately 1.7 mg/dL consistent with acute kidney injury.  He maintained good urine output and this was monitored.  The following day, he was returned for lytics check.  Drug-coated balloon angioplasty of the proximal bypass as well as angioplasty of the distal anastomosis, TP trunk and posterior tibial artery were performed.  He then had a patent bypass graft in the left leg with single-vessel runoff of the peroneal artery.  He had brisk peroneal Doppler signal at completion.  His vital signs remained stable he is afebrile.  He had improvement in left foot pain.  His heparin infusion continued.  On hospital day 3, he maintained good Doppler signals in his foot and was ambulating.  Heparin infusion was discontinued and he was discharged home on Eliquis 5  mg twice daily.  Consults: None  Treatments: anticoagulation: heparin;  thrombolysis  Disposition: Discharge disposition: 01-Home or Self Care       - For [facility] Registry use ---  Post-op:  Wound infection: No  Graft infection: No  Transfusion: No  If yes, 0 units given New Arrhythmia: No Patency judged by: [x ] Dopper only, [ ]  Palpable graft pulse, [ ]  Palpable distal pulse, [ ]  ABI inc. > 0.15, [ ]  Duplex Discharge ABI: R , L  Discharge TBI: R , L  D/C Ambulatory Status: Ambulatory  Complications: MI: [ ]  No, [ ]  Troponin only, [ ]  EKG or Clinical CHF: No Resp failure: [x ] none, [ ]  Pneumonia, [ ]  Ventilator Chg in renal function: [x ] none, [ ]  Inc. Cr > 0.5, [ ]  Temp. Dialysis, [ ]  Permanent dialysis Stroke: [x ] None, [ ]  Minor, [ ]  Major Return to OR: No  Reason for return to OR: [ ]  Bleeding, [ ]  Infection, [ ]  Thrombosis, [ ]  Revision  Discharge medications: Statin use:  Yes ASA use:  Yes Plavix use:  Yes Beta blocker use: No  for medical reason not indicated Coumadin use: No  for medical reason not indicated    Patient Instructions:  Allergies as of 05/15/2020   No Known Allergies      Medication List     TAKE these medications    apixaban 5 MG Tabs tablet Commonly known as: ELIQUIS Take 1 tablet (5 mg total) by mouth 2 (two) times daily.   aspirin EC 81 MG tablet Take 81 mg by mouth daily. Swallow whole.   clopidogrel 75 MG tablet Commonly known as: Plavix Take 1 tablet (75 mg total) by mouth daily.  losartan-hydrochlorothiazide 100-25 MG tablet Commonly known as: HYZAAR Take 1 tablet by mouth daily.   oxyCODONE-acetaminophen 5-325 MG tablet Commonly known as: Percocet Take 1 tablet by mouth every 6 (six) hours as needed for severe pain.   rosuvastatin 10 MG tablet Commonly known as: CRESTOR Take 1 tablet (10 mg total) by mouth daily.       Activity: activity as tolerated Diet: cardiac diet Wound Care: keep wound clean and  dry  Follow-up with Dr. Chestine Spore in 4 weeks.  Signed: Milinda Antis 05/16/2020 12:31 PM

## 2020-05-18 ENCOUNTER — Telehealth: Payer: Self-pay

## 2020-05-18 ENCOUNTER — Other Ambulatory Visit (HOSPITAL_BASED_OUTPATIENT_CLINIC_OR_DEPARTMENT_OTHER): Payer: Self-pay

## 2020-05-18 ENCOUNTER — Other Ambulatory Visit: Payer: Self-pay

## 2020-05-18 DIAGNOSIS — I739 Peripheral vascular disease, unspecified: Secondary | ICD-10-CM

## 2020-05-18 MED ORDER — ELIQUIS 5 MG PO TABS: 5.0000 mg | ORAL_TABLET | Freq: Two times a day (BID) | ORAL | 0 refills | Status: DC

## 2020-05-18 MED ORDER — ELIQUIS 5 MG PO TABS: 5.0000 mg | ORAL_TABLET | Freq: Two times a day (BID) | ORAL | 3 refills | Status: DC

## 2020-05-18 MED ORDER — ELIQUIS 5 MG PO TABS
5.0000 mg | ORAL_TABLET | Freq: Two times a day (BID) | ORAL | 0 refills | Status: DC
  Filled 2020-05-18 (×2): qty 60, 30d supply, fill #0

## 2020-05-18 NOTE — Telephone Encounter (Signed)
Called pt to f/u on if he wanted to stay on Eliquis or switch to Coumadin. Pt's wife Ryan [name]) said he was not home and will call us back. I have given her the Eliquis financial assistance number to call and see if that would help with cost. She is going to call them and then will call us back once they have made a decision.

## 2020-05-18 NOTE — Telephone Encounter (Signed)
Pt called to let us know he has decided to get Eliquis filled. He will call us back if he has any issues that arise. No further questions/concerns at this time.

## 2020-05-19 ENCOUNTER — Ambulatory Visit (INDEPENDENT_AMBULATORY_CARE_PROVIDER_SITE_OTHER): Payer: Medicare Other | Admitting: Podiatrist

## 2020-05-19 ENCOUNTER — Other Ambulatory Visit: Payer: Self-pay

## 2020-05-19 DIAGNOSIS — B353 Tinea pedis: Secondary | ICD-10-CM | POA: Diagnosis not present

## 2020-05-19 DIAGNOSIS — I739 Peripheral vascular disease, unspecified: Secondary | ICD-10-CM | POA: Diagnosis not present

## 2020-05-19 DIAGNOSIS — I779 Disorder of arteries and arterioles, unspecified: Secondary | ICD-10-CM | POA: Diagnosis not present

## 2020-05-19 DIAGNOSIS — G629 Polyneuropathy, unspecified: Secondary | ICD-10-CM | POA: Diagnosis not present

## 2020-05-19 DIAGNOSIS — M79609 Pain in unspecified limb: Secondary | ICD-10-CM | POA: Diagnosis not present

## 2020-05-19 DIAGNOSIS — B351 Tinea unguium: Secondary | ICD-10-CM | POA: Diagnosis not present

## 2020-05-19 MED ORDER — GABAPENTIN 100 MG PO CAPS: 100.0000 mg | ORAL_CAPSULE | Freq: Every day | ORAL | 0 refills | Status: DC

## 2020-05-19 MED ORDER — KETOCONAZOLE 2 % EX CREA: TOPICAL_CREAM | CUTANEOUS | 2 refills | Status: DC

## 2020-05-19 NOTE — Progress Notes (Signed)
HPI: Patient is 70 y.o. male who presents today for a concern of Neuropathy as well as requesting a Nail trim.  The patient states he has numbness in both feet that startes after a blood "clog" progressed through both feet and lower legs.  He states he just got out of the hospital this past Saturday for another vascular procedure to fix another clog.  He also has concern of dry skin between his toes.   Patient Active Problem List   Diagnosis Date Noted  . 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 Critical lower limb ischemia (HCC) 12/11/2016  . Claudication in peripheral vascular disease (HCC) 11/28/2016  . HTN (hypertension) 10/09/2014  . Tobacco use 10/09/2014  . PAD (peripheral artery disease) (HCC)     Current Outpatient Medications on File Prior to Visit  Medication Sig Dispense Refill  . apixaban (ELIQUIS) 5 MG TABS tablet Take 1 tablet (5 mg total) by mouth 2 (two) times daily. 60 tablet 0  . aspirin EC 81 MG tablet Take 81 mg by mouth daily. Swallow whole.    . clopidogrel (PLAVIX) 75 MG tablet Take 1 tablet (75 mg total) by mouth daily. 30 tablet 11  . losartan-hydrochlorothiazide (HYZAAR) 100-25 MG tablet Take 1 tablet by mouth daily.     Marland Kitchen oxyCODONE-acetaminophen (PERCOCET) 5-325 MG tablet Take 1 tablet by mouth every 6 (six) hours as needed for severe pain. 15 tablet 0  . rosuvastatin (CRESTOR) 10 MG tablet Take 1 tablet (10 mg total) by mouth daily. 30 tablet 3   No current facility-administered medications on file prior to visit.    No Known Allergies  Review of Systems No fevers, chills, nausea, muscle aches, no difficulty breathing, no calf pain, no chest pain or shortness of breath.   Physical Exam  GENERAL APPEARANCE: Alert, conversant. Appropriately groomed. No acute distress.   VASCULAR: Pedal pulses are non palpable DP Or PT bilateral.  Capillary refill time is immediate to all  digits,  Proximal to distal cooling it warm to warm.  Digital hair growth is absent.   NEUROLOGIC: sensation is diminished to 5.07 monofilament at 0/5 sites bilateral.  Light touch is intact bilateral, vibratory sensation absent bilateral  MUSCULOSKELETAL: acceptable muscle strength, tone and stability bilateral.  No gross boney pedal deformities noted.  No pain, crepitus or limitation noted with foot and ankle range of motion bilateral.   DERMATOLOGIC: skin is xerotic and dry.  Macerated tissue is present in webspaces consistent with interdigital tinea.  Digital nails are thick, discolored, dystrophic, brittle with subungual debris present and clinically mycotic x 10.      Assessment     ICD-10-CM   1. Neuropathy  G62.9   2. Pain due to onychomycosis of nail  B35.1    M79.609   3. Tinea pedis of both feet  B35.3   4. PAD (peripheral artery disease) (HCC)  I73.9      Plan Patient examined.  I discussed the neuropathy he is experiencing could also be due to his circulation issues and that now improved, could help the neuropathy.  I will start him on a low dose of gabapentin to help with the neuropathy at night.  Debridement of toenails was also recommended.  Onychoreduction of symptomatic toenails was performed via nail nipper and power burr without iatrogenic incident.  Patient was instructed on signs and symptoms of infection and was told to call immediately should any of these  arise.  Patient requires professional debridement due to his circulatory issues as well as his neuropathy.  I also wrote a prescription for ketoconazole to use on the feet daily for 4 weeks.  He will be seen back in a month to follow up on his neuropathy and tinea.

## 2020-05-19 NOTE — Patient Instructions (Signed)
Neuropathic Pain Neuropathic pain is pain caused by damage to the nerves that are responsible for certain sensations in your body (sensory nerves). The pain can be caused by:  Damage to the sensory nerves that send signals to your spinal cord and brain (peripheral nervous system).  Damage to the sensory nerves in your brain or spinal cord (central nervous system). Neuropathic pain can make you more sensitive to pain. Even a minor sensation can feel very painful. This is usually a long-term condition that can be difficult to treat. The type of pain differs from person to person. It may:  Start suddenly (acute), or it may develop slowly and last for a long time (chronic).  Come and go as damaged nerves heal, or it may stay at the same level for years.  Cause emotional distress, loss of sleep, and a lower quality of life. What are the causes? The most common cause of this condition is diabetes. Many other diseases and conditions can also cause neuropathic pain. Causes of neuropathic pain can be classified as:  Toxic. This is caused by medicines and chemicals. The most common cause of toxic neuropathic pain is damage from cancer treatments (chemotherapy).  Metabolic. This can be caused by: ? Diabetes. This is the most common disease that damages the nerves. ? Lack of vitamin B from long-term alcohol abuse.  Traumatic. Any injury that cuts, crushes, or stretches a nerve can cause damage and pain. A common example is feeling pain after losing an arm or leg (phantom limb pain).  Compression-related. If a sensory nerve gets trapped or compressed for a long period of time, the blood supply to the nerve can be cut off.  Vascular. Many blood vessel diseases can cause neuropathic pain by decreasing blood supply and oxygen to nerves.  Autoimmune. This type of pain results from diseases in which the body's defense system (immune system) mistakenly attacks sensory nerves. Examples of autoimmune diseases  that can cause neuropathic pain include lupus and multiple sclerosis.  Infectious. Many types of viral infections can damage sensory nerves and cause pain. Shingles infection is a common cause of this type of pain.  Inherited. Neuropathic pain can be a symptom of many diseases that are passed down through families (genetic). What increases the risk? You are more likely to develop this condition if:  You have diabetes.  You smoke.  You drink too much alcohol.  You are taking certain medicines, including medicines that kill cancer cells (chemotherapy) or that treat immune system disorders. What are the signs or symptoms? The main symptom is pain. Neuropathic pain is often described as:  Burning.  Shock-like.  Stinging.  Hot or cold.  Itching. How is this diagnosed? No single test can diagnose neuropathic pain. It is diagnosed based on:  Physical exam and your symptoms. Your health care provider will ask you about your pain. You may be asked to use a pain scale to describe how bad your pain is.  Tests. These may be done to see if you have a high sensitivity to pain and to help find the cause and location of any sensory nerve damage. They include: ? Nerve conduction studies to test how well nerve signals travel through your sensory nerves (electrodiagnostic testing). ? Stimulating your sensory nerves through electrodes on your skin and measuring the response in your spinal cord and brain (somatosensory evoked potential).  Imaging studies, such as: ? X-rays. ? CT scan. ? MRI. How is this treated? Treatment for neuropathic pain may change   over time. You may need to try different treatment options or a combination of treatments. Some options include:  Treating the underlying cause of the neuropathy, such as diabetes, kidney disease, or vitamin deficiencies.  Stopping medicines that can cause neuropathy, such as chemotherapy.  Medicine to relieve pain. Medicines may  include: ? Prescription or over-the-counter pain medicine. ? Anti-seizure medicine. ? Antidepressant medicines. ? Pain-relieving patches that are applied to painful areas of skin. ? A medicine to numb the area (local anesthetic), which can be injected as a nerve block.  Transcutaneous nerve stimulation. This uses electrical currents to block painful nerve signals. The treatment is painless.  Alternative treatments, such as: ? Acupuncture. ? Meditation. ? Massage. ? Physical therapy. ? Pain management programs. ? Counseling. Follow these instructions at home: Medicines  Take over-the-counter and prescription medicines only as told by your health care provider.  Do not drive or use heavy machinery while taking prescription pain medicine.  If you are taking prescription pain medicine, take actions to prevent or treat constipation. Your health care provider may recommend that you: ? Drink enough fluid to keep your urine pale yellow. ? Eat foods that are high in fiber, such as fresh fruits and vegetables, whole grains, and beans. ? Limit foods that are high in fat and processed sugars, such as fried or sweet foods. ? Take an over-the-counter or prescription medicine for constipation.   Lifestyle  Have a good support system at home.  Consider joining a chronic pain support group.  Do not use any products that contain nicotine or tobacco, such as cigarettes and e-cigarettes. If you need help quitting, ask your health care provider.  Do not drink alcohol.   General instructions  Learn as much as you can about your condition.  Work closely with all your health care providers to find the treatment plan that works best for you.  Ask your health care provider what activities are safe for you.  Keep all follow-up visits as told by your health care provider. This is important. Contact a health care provider if:  Your pain treatments are not working.  You are having side effects  from your medicines.  You are struggling with tiredness (fatigue), mood changes, depression, or anxiety. Summary  Neuropathic pain is pain caused by damage to the nerves that are responsible for certain sensations in your body (sensory nerves).  Neuropathic pain may come and go as damaged nerves heal, or it may stay at the same level for years.  Neuropathic pain is usually a long-term condition that can be difficult to treat. Consider joining a chronic pain support group. This information is not intended to replace advice given to you by your health care provider. Make sure you discuss any questions you have with your health care provider. Document Revised: 05/09/2018 Document Reviewed: 02/02/2017 Elsevier Patient Education  2021 [employer].  

## 2020-05-24 DIAGNOSIS — I10 Essential (primary) hypertension: Secondary | ICD-10-CM | POA: Diagnosis not present

## 2020-05-24 DIAGNOSIS — B353 Tinea pedis: Secondary | ICD-10-CM | POA: Diagnosis not present

## 2020-05-24 DIAGNOSIS — I739 Peripheral vascular disease, unspecified: Secondary | ICD-10-CM | POA: Diagnosis not present

## 2020-05-24 DIAGNOSIS — Z720 Tobacco use: Secondary | ICD-10-CM | POA: Diagnosis not present

## 2020-05-24 DIAGNOSIS — E739 Lactose intolerance, unspecified: Secondary | ICD-10-CM | POA: Diagnosis not present

## 2020-05-26 ENCOUNTER — Other Ambulatory Visit: Payer: Self-pay

## 2020-05-26 ENCOUNTER — Encounter (HOSPITAL_COMMUNITY): Payer: Self-pay | Admitting: *Deleted

## 2020-05-26 ENCOUNTER — Emergency Department (HOSPITAL_COMMUNITY): Payer: Medicare Other

## 2020-05-26 ENCOUNTER — Encounter: Payer: Self-pay | Admitting: Podiatrist

## 2020-05-26 ENCOUNTER — Emergency Department (HOSPITAL_COMMUNITY)
Admission: EM | Admit: 2020-05-26 | Discharge: 2020-05-26 | Disposition: A | Discharge status: Home / Self Care | Payer: Medicare Other | Attending: Emergency Medicine | Admitting: Emergency Medicine

## 2020-05-26 DIAGNOSIS — M79662 Pain in left lower leg: Secondary | ICD-10-CM | POA: Diagnosis not present

## 2020-05-26 DIAGNOSIS — I10 Essential (primary) hypertension: Secondary | ICD-10-CM | POA: Insufficient documentation

## 2020-05-26 DIAGNOSIS — G609 Hereditary and idiopathic neuropathy, unspecified: Secondary | ICD-10-CM | POA: Diagnosis not present

## 2020-05-26 DIAGNOSIS — G9009 Other idiopathic peripheral autonomic neuropathy: Secondary | ICD-10-CM | POA: Diagnosis not present

## 2020-05-26 DIAGNOSIS — Z79899 Other long term (current) drug therapy: Secondary | ICD-10-CM | POA: Diagnosis not present

## 2020-05-26 DIAGNOSIS — R200 Anesthesia of skin: Secondary | ICD-10-CM | POA: Diagnosis not present

## 2020-05-26 DIAGNOSIS — M19072 Primary osteoarthritis, left ankle and foot: Secondary | ICD-10-CM | POA: Diagnosis not present

## 2020-05-26 DIAGNOSIS — F17210 Nicotine dependence, cigarettes, uncomplicated: Secondary | ICD-10-CM | POA: Insufficient documentation

## 2020-05-26 DIAGNOSIS — M79672 Pain in left foot: Secondary | ICD-10-CM | POA: Diagnosis present

## 2020-05-26 DIAGNOSIS — Z7982 Long term (current) use of aspirin: Secondary | ICD-10-CM | POA: Diagnosis not present

## 2020-05-26 DIAGNOSIS — M7732 Calcaneal spur, left foot: Secondary | ICD-10-CM | POA: Diagnosis not present

## 2020-05-26 DIAGNOSIS — Z7901 Long term (current) use of anticoagulants: Secondary | ICD-10-CM | POA: Diagnosis not present

## 2020-05-26 MED ORDER — LIDOCAINE 5 % EX PTCH
1.0000 | MEDICATED_PATCH | CUTANEOUS | Status: DC
  Administered 2020-05-26: 1 via TRANSDERMAL
  Filled 2020-05-26: qty 1

## 2020-05-26 NOTE — Discharge Instructions (Addendum)
Your x-rays are reassuring tonight with no significant findings that would explain your pain.  I suspect this is related to nerve pain.  Your new medication gabapentin may start helping your pain better, especially since you have noticed slight improvement today.  If this does not continue to improve your pain I recommend discussing increasing the dose of this medication with your podiatrist.  Your exam including your blood flow in your feet today are reassuring.

## 2020-05-26 NOTE — ED Triage Notes (Signed)
pt with left foot pain x 2 months.  C/o numbness and pain

## 2020-05-28 NOTE — ED Provider Notes (Signed)
[facility] EMERGENCY DEPARTMENT Provider Note   CSN: [id number] Arrival date & time: 05/26/20  1824     History Chief Complaint  Patient presents with  . Foot Pain    Ryan [name] is a 70 y.o. male with a history including hypertension, peripheral vascular disease, and also history of lower extremity thrombothrombosis under the care of care of vascular surgeon Dr. Chestine Spore and Dr. Irving Shows of podiatry presenting with complaints of left foot and anterior tibial tenderness but also numbness on his plantar foot and left lateral lower leg which has been present for the past 2 months.  He describes sometimes aching aching, sometimes stabbing pain to palpation along his left anterior tibia and also with movement, especially foot flexion.  He underwent a peripheral vascular thrombectomy several weeks ago,, and was just started on gabapentin by his podiatrist 6 days ago and reports persistent symptoms per above although endorses a little bit of improvement in his pain just today.  He denies swelling in the extremity and has noted no skin changes such as wounds wounds or discoloration.  He denies injuries to the extremity.  He does take oxycodone every 6 hours consistently with some improvement in his pain.  He denies fevers or chills, denies back pain or history of sciatica, no other complaints.  HPI     Past Medical History:  Diagnosis Date  . Hypertension   . PVD (peripheral vascular disease) [facility])     Patient Active Problem List   Diagnosis Date Noted  . 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 Critical lower limb ischemia (HCC) 12/11/2016  . Claudication in peripheral vascular disease (HCC) 11/28/2016  . HTN (hypertension) 10/09/2014  . Tobacco use 10/09/2014  . PAD (peripheral artery disease) (HCC)     Past Surgical History:  Procedure Laterality Date  . ABDOMINAL AORTAGRAM Left 12/13/2016   ABDOMINAL  AORTOGRAM W/LOWER EXTREMITY/notes 12/13/2016  . ABDOMINAL AORTOGRAM W/LOWER EXTREMITY N/A 11/28/2016   Procedure: ABDOMINAL AORTOGRAM W/LOWER EXTREMITY;  Surgeon: Yates Decamp, MD;  Location: [facility] INVASIVE CV LAB;  Service: Cardiovascular;  Laterality: N/A;  bilateral  . ABDOMINAL AORTOGRAM W/LOWER EXTREMITY N/A 12/13/2016   Procedure: ABDOMINAL AORTOGRAM W/LOWER EXTREMITY;  Surgeon: Maeola Harman, MD;  Location: [facility] INVASIVE CV LAB;  Service: Cardiovascular;  Laterality: N/A;  unilater left leg  . ABDOMINAL AORTOGRAM W/LOWER EXTREMITY N/A 01/09/2018   Procedure: ABDOMINAL AORTOGRAM W/LOWER EXTREMITY;  Surgeon: Cephus Shelling, MD;  Location: [facility] INVASIVE CV LAB;  Service: Cardiovascular;  Laterality: N/A;  . ABDOMINAL AORTOGRAM W/LOWER EXTREMITY Left 09/22/2019   Procedure: ABDOMINAL AORTOGRAM W/LOWER EXTREMITY;  Surgeon: Maeola Harman, MD;  Location: [facility] INVASIVE CV LAB;  Service: Cardiovascular;  Laterality: Left;  . FEMORAL-POPLITEAL BYPASS GRAFT Left 12/14/2016   Procedure: BYPASS GRAFT COMMON FEMORAL- BELOW KNEE POPLITEAL ARTERY with Bovine Patch to Below the knee poplitieal artery.;  Surgeon: Fransisco Hertz, MD;  Location: [facility] OR;  Service: Vascular;  Laterality: Left;  . FEMORAL-POPLITEAL BYPASS GRAFT Left 01/28/2018   Procedure: BYPASS GRAFT FEMORAL-BELOW KNEE POPLITEAL ARTERY REDO with propaten vascular graft removable ring;  Surgeon: Cephus Shelling, MD;  Location: [facility] OR;  Service: Vascular;  Laterality: Left;  . FEMUR SURGERY Right 2009   pt. has a rod in that leg  . LOWER EXTREMITY ANGIOGRAPHY Left 11/28/2016   Procedure: Lower Extremity Angiography;  Surgeon: Yates Decamp, MD;  Location: [facility] INVASIVE CV LAB;  Service: Cardiovascular;  Laterality: Left;  lysis followup lower leg  . PATCH ANGIOPLASTY Left 01/28/2018   Procedure: PATCH ANGIOPLASTY USING Livia SnellenXENOSURE BIOLOGIC PATCH;  Surgeon: Cephus Shellinglark, Christopher J, MD;  [facility];  Service: Vascular;  Laterality:  Left;  . PERIPHERAL VASCULAR BALLOON ANGIOPLASTY  11/28/2016   Procedure: PERIPHERAL VASCULAR BALLOON ANGIOPLASTY;  Surgeon: Yates DecampGanji, Jay, MD;  Location: [facility] INVASIVE CV LAB;  Service: Cardiovascular;;  SFA  . PERIPHERAL VASCULAR CATHETERIZATION N/A 08/11/2014   Procedure: Lower Extremity Angiography;  Surgeon: Yates DecampJay Ganji, MD;  [facility];  Service: Cardiovascular;  Laterality: N/A;  . PERIPHERAL VASCULAR INTERVENTION Left 09/22/2019   Procedure: PERIPHERAL VASCULAR INTERVENTION;  Surgeon: Maeola Harmanain, Brandon Christopher, MD;  [facility];  Service: Cardiovascular;  Laterality: Left;  . PERIPHERAL VASCULAR INTERVENTION Left 09/23/2019   Procedure: PERIPHERAL VASCULAR INTERVENTION;  Surgeon: Nada LibmanBrabham, Vance W, MD;  Location: [facility] INVASIVE CV LAB;  Service: Cardiovascular;  Laterality: Left;  . PERIPHERAL VASCULAR INTERVENTION Left 05/13/2020   Procedure: PERIPHERAL VASCULAR INTERVENTION;  Surgeon: Cephus Shellinglark, Christopher J, MD;  [facility];  Service: Cardiovascular;  Laterality: Left;  . PERIPHERAL VASCULAR THROMBECTOMY  11/28/2016   Procedure: PERIPHERAL VASCULAR THROMBECTOMY;  Surgeon: Yates DecampGanji, Jay, MD;  Location: [facility] INVAS[facility] CV LAB;  Service: Cardiovascular;;  Profunda  . PERIPHERAL VASCULAR THROMBECTOMY  11/28/2016   Procedure: PERIPHERAL VASCULAR THROMBECTOMY;  Surgeon: Yates DecampGanji, Jay, MD;  Location: [facility] INVASIVE CV LAB;  Service: Cardiovascular;;  . PERIPHERAL VASCULAR THROMBECTOMY N/A 05/12/2020   Procedure: PERIPHERAL VASCULAR THROMBECTOMY-Left Leg;  Surgeon: Leonie DouglasHawken, Lunney N, MD;  Location: [facility] INVAS[facility] CV LAB;  Service: Cardiovascular;  Laterality: N/A;  . PERIPHERAL VASCULAR THROMBECTOMY N/A 05/13/2020   Procedure: PERIPHERAL VASCULAR THROMBECTOMY- LYSIS RECHECK;  Surgeon: Cephus Shellinglark, Christopher J, MD;  Location: [facility] INVASIVE CV LAB;  Service: Cardiovascular;  Laterality: N/A;  . PULMONARY THROMBECTOMY N/A 09/23/2019   Procedure: LYSIS RECHECK;  Surgeon: Nada LibmanBrabham, Vance W,  MD;  Location: [facility] INVASIVE CV LAB;  Service: Cardiovascular;  Laterality: N/A;  . THROMBECTOMY FEMORAL ARTERY Left 12/14/2016   Procedure: THROMBECTOMY PROFUNDA FEMORAL ARTERY;  Surgeon: Fransisco Hertzhen, Brian L, MD;  [facility];  Service: Vascular;  Laterality: Left;       Family History  Problem Relation Age of Onset  . Hypertension Mother   . Diabetes Mother   . Hypertension Father     Social History   Tobacco Use  . Smoking status: Current Every Day Smoker    Packs/day: 0.75    Years: 56.00    Pack years: 42.00    Types: Cigarettes  . Smokeless tobacco: Never Used  Vaping Use  . Vaping Use: Never used  Substance Use Topics  . Alcohol use: Yes    Alcohol/week: 10.0 standard drinks    Types: 10 Shots of liquor per week    Comment:  01/29/2018 "1-2 shots per night"  . Drug use: No    Home Medications Prior to Admission medications   Medication Sig Start Date End Date Taking? Authorizing Provider  apixaban (ELIQUIS) 5 MG TABS tablet Take 1 tablet (5 mg total) by mouth 2 (two) times daily. 05/18/20   Cephus Shellinglark, Christopher J, MD  aspirin EC 81 MG tablet Take 81 mg by mouth daily. Swallow whole.    [provider]  clopidogrel (PLAVIX) 75 MG tablet Take 1 tablet (75 mg total) by mouth daily. 11/06/19   Lars Mageollins, Emma M, PA-C  gabapentin (NEURONTIN) 100 MG capsule Take 1 capsule (100 mg total) by mouth at bedtime. 05/19/20  Delories Heinz, DPM  ketoconazole (NIZORAL) 2 % cream Apply to toes and interspaces twice daily for dead skin/ itching Jun 05, 2020   Delories Heinz, DPM  losartan-hydrochlorothiazide (HYZAAR) 100-25 MG tablet Take 1 tablet by mouth daily.  07/10/19   [provider]  oxyCODONE-acetaminophen (PERCOCET) 5-325 MG tablet Take 1 tablet by mouth every 6 (six) hours as needed for severe pain. 05/11/20   Cephus Shelling, MD  rosuvastatin (CRESTOR) 10 MG tablet Take 1 tablet (10 mg total) by mouth daily. 09/24/19   Lars Mage, PA-C    Allergies     Patient has no known allergies.  Review of Systems   Review of Systems  Constitutional: Negative for chills and fever.  Musculoskeletal: Positive for arthralgias. Negative for joint swelling and myalgias.  Skin: Negative for color change, pallor and wound.  Neurological: Positive for numbness. Negative for weakness.  All other systems reviewed and are negative.   Physical Exam Updated Vital Signs BP (!) 158/84 (BP Location: Right Arm)   Pulse 60   Temp 98 F (36.7 C) (Oral)   Resp 16   Ht 5\' 6"  (1.676 m)   Wt 66.2 kg   SpO2 100%   BMI 23.57 kg/m   Physical Exam Constitutional:      Appearance: He is well-developed.  HENT:     Head: Atraumatic.  Cardiovascular:     Rate and Rhythm: Normal rate.     Pulses:          Dorsalis pedis pulses are 2+ on the right side and 1+ on the left side.       Posterior tibial pulses are 2+ on the right side and 2+ on the left side.     Comments: Pulses present.  Feet are warm to touch.  No rash or lesions. Musculoskeletal:        General: Tenderness present. No swelling, deformity or signs of injury.     Cervical back: Normal range of motion.     Right lower leg: No edema.     Left lower leg: No edema.  Skin:    General: Skin is warm and dry.  Neurological:     Mental Status: He is alert.     Sensory: No sensory deficit.     Deep Tendon Reflexes: Reflexes normal.     ED Results / Procedures / Treatments   Labs (all labs ordered are listed, but only abnormal results are displayed) Labs Reviewed - No data to display  EKG None  Radiology DG Tibia/Fibula Left  Result Date: 05/26/2020 CLINICAL DATA:  Left lower extremity pain. EXAM: LEFT TIBIA AND FIBULA - 2 VIEW COMPARISON:  None. FINDINGS: Cortical margins of the tibia and fibular intact. There is no evidence of fracture or other focal bone lesions. Vascular stent projects over the popliteal region and area of the superficial femoral artery. Soft tissues are unremarkable.  IMPRESSION: Unremarkable radiographs of the left lower leg. Electronically Signed   By: 05/28/2020 M.D.   On: 05/26/2020 20:59   DG Foot Complete Left  Result Date: 05/26/2020 CLINICAL DATA:  Left foot pain.  Numbness. EXAM: LEFT FOOT - COMPLETE 3+ VIEW COMPARISON:  None. FINDINGS: There is no evidence of fracture or dislocation. 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 No erosion or bony destruction. Tiny plantar calcaneal spur. Soft tissues are unremarkable. IMPRESSION: 1. 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 2. Tiny plantar calcaneal spur. Electronically Signed   By: 05/28/2020  M.D.   On: 05/26/2020 20:55    Procedures Procedures   Medications Ordered in ED Medications - No data to display  ED Course  I have reviewed the triage vital signs and the nursing notes.  Pertinent labs & imaging results that were available during my care of the patient were reviewed by me and considered in my medical decision making (see chart for details).    MDM Rules/Calculators/A&P                          Imaging reviewed and discussed with patient.  There is no sign of any acute trauma.  His symptoms are chronic over the past several months and he has a documented history of neuropathy.  He was recently placed on Neurontin by his podiatrist.  Encouraged to follow-up with her for further evaluation management if the Neurontin does not continue to improve his symptoms.  Today's exam is reassuring with adequate pedal pulses. Final Clinical Impression(s) / ED Diagnoses Final diagnoses:  Idiopathic peripheral neuropathy    Rx / DC Orders ED Discharge Orders    None       Victoriano Lain 05/28/20 1157    Mancel Bale, MD 06/01/20 1023

## 2020-06-01 ENCOUNTER — Other Ambulatory Visit: Payer: Self-pay

## 2020-06-01 ENCOUNTER — Other Ambulatory Visit: Payer: Self-pay | Admitting: Vascular Surgery

## 2020-06-01 DIAGNOSIS — I739 Peripheral vascular disease, unspecified: Secondary | ICD-10-CM

## 2020-06-01 MED ORDER — OXYCODONE-ACETAMINOPHEN 5-325 MG PO TABS: 1.0000 | ORAL_TABLET | Freq: Four times a day (QID) | ORAL | 0 refills | Status: DC | PRN

## 2020-06-07 DIAGNOSIS — B353 Tinea pedis: Secondary | ICD-10-CM | POA: Diagnosis not present

## 2020-06-07 DIAGNOSIS — M79672 Pain in left foot: Secondary | ICD-10-CM | POA: Diagnosis not present

## 2020-06-07 DIAGNOSIS — I10 Essential (primary) hypertension: Secondary | ICD-10-CM | POA: Diagnosis not present

## 2020-06-07 DIAGNOSIS — M79662 Pain in left lower leg: Secondary | ICD-10-CM | POA: Diagnosis not present

## 2020-06-07 DIAGNOSIS — Z720 Tobacco use: Secondary | ICD-10-CM | POA: Diagnosis not present

## 2020-06-07 DIAGNOSIS — I70212 Atherosclerosis of native arteries of extremities with intermittent claudication, left leg: Secondary | ICD-10-CM | POA: Diagnosis not present

## 2020-06-07 DIAGNOSIS — I70291 Other atherosclerosis of native arteries of extremities, right leg: Secondary | ICD-10-CM | POA: Diagnosis not present

## 2020-06-09 ENCOUNTER — Ambulatory Visit (INDEPENDENT_AMBULATORY_CARE_PROVIDER_SITE_OTHER): Payer: Medicare Other | Admitting: Podiatry

## 2020-06-09 ENCOUNTER — Other Ambulatory Visit: Payer: Self-pay

## 2020-06-09 DIAGNOSIS — L97521 Non-pressure chronic ulcer of other part of left foot limited to breakdown of skin: Secondary | ICD-10-CM | POA: Diagnosis not present

## 2020-06-09 DIAGNOSIS — I739 Peripheral vascular disease, unspecified: Secondary | ICD-10-CM

## 2020-06-10 ENCOUNTER — Encounter: Payer: Self-pay | Admitting: Podiatry

## 2020-06-10 NOTE — Progress Notes (Signed)
Subjective:  Patient ID: Ryan [name], male    DOB: 1950/09/20,  MRN: [id number]  Chief Complaint  Patient presents with  . Numbness    Neuropathy pain     70 y.o. male presents for wound care.  Patient presents with complaint of left fifth digit superficial ulceration in between the fifth and fourth interdigital space.  Patient states is very painful to the left leg.  The ulceration is very superficial likely due to blister formation.  He has history of vascular issues to both lower extremity.  His toes are tender as a lot of vascular pain as well.  He has not seen me prior to seeing me today.  He denies any other acute complaints.  He would like to discuss treatment options for this.   Review of Systems: Negative except as noted in the HPI. Denies N/V/F/Ch.  Past Medical History:  Diagnosis Date  . Hypertension   . PVD (peripheral vascular disease) (HCC)     Current Outpatient Medications:  .  apixaban (ELIQUIS) 5 MG TABS tablet, Take 1 tablet (5 mg total) by mouth 2 (two) times daily., Disp: 60 tablet, Rfl: 0 .  aspirin EC 81 MG tablet, Take 81 mg by mouth daily. Swallow whole., Disp: , Rfl:  .  clopidogrel (PLAVIX) 75 MG tablet, Take 1 tablet (75 mg total) by mouth daily., Disp: 30 tablet, Rfl: 11 .  gabapentin (NEURONTIN) 100 MG capsule, Take 1 capsule (100 mg total) by mouth at bedtime., Disp: 30 capsule, Rfl: 0 .  ketoconazole (NIZORAL) 2 % cream, Apply to toes and interspaces twice daily for dead skin/ itching, Disp: 60 g, Rfl: 2 .  losartan-hydrochlorothiazide (HYZAAR) 100-25 MG tablet, Take 1 tablet by mouth daily. , Disp: , Rfl:  .  oxyCODONE-acetaminophen (PERCOCET) 5-325 MG tablet, Take 1 tablet by mouth every 6 (six) hours as needed for severe pain., Disp: 20 tablet, Rfl: 0 .  rosuvastatin (CRESTOR) 10 MG tablet, Take 1 tablet (10 mg total) by mouth daily., Disp: 30 tablet, Rfl: 3  Social History   Tobacco Use  Smoking Status Current Every Day Smoker  . Packs/day:  0.75  . Years: 56.00  . Pack years: 42.00  . Types: Cigarettes  Smokeless Tobacco Never Used    No Known Allergies Objective:  There were no vitals filed for this visit. There is no height or weight on file to calculate BMI. Constitutional Well developed. Well nourished.  Vascular Dorsalis pedis pulses non palpable bilaterally. Posterior tibial pulses non palpable bilaterally. Capillary refill norma diminished to all digits.  No cyanosis or clubbing noted. Pedal hair growth not seen  Neurologic Normal speech. Oriented to person, place, and time. Protective sensation absent  Dermatologic Wound Location: Left fifth digit limited to the breakdown of the skin.  No clinical signs of infection noted. Wound Base: Mixed Granular/Fibrotic Peri-wound: Normal Exudate: Scant/small amount Serosanguinous exudate Wound Measurements: -See below  Orthopedic: No pain to palpation either foot.   Radiographs: None Assessment:   1. PAD (peripheral artery disease) (HCC)   2. Chronic ulcer of toe, left, limited to breakdown of skin (HCC)   3. Claudication in peripheral vascular disease (HCC)    Plan:  Patient was evaluated and treated and all questions answered.  Ulcer left fifth digit superficial ulceration limited to the breakdown of the skin with underlying claudication pain secondary to peripheral vascular disease -Debridement as below. -Dressed with Betadine wet-to-dry, DSD. -Continue off-loading with surgical shoe. -He is scheduled for ABIs PVR study on  5/17.  He has very complicated endovascular history. -Patient is a high risk of losing the digit versus amputation of the leg.  I discussed this with the patient.  He states understanding  Procedure: Excisional Debridement of Wound Tool: Sharp chisel blade/tissue nipper Rationale: Removal of non-viable soft tissue from the wound to promote healing.  Anesthesia: none Pre-Debridement Wound Measurements: 1 cm x 0.6 cm x 0.2 cm   Post-Debridement Wound Measurements: 1.1 cm x 0.7 cm x 0.2 cm  Type of Debridement: Sharp Excisional Tissue Removed: Non-viable soft tissue Blood loss: Minimal (<50cc) Depth of Debridement: subcutaneous tissue. Technique: Sharp excisional debridement to bleeding, viable wound base.  Wound Progress: It is my initial evaluation.  I will continue monitor the progression of it. Site healing conversation 7 Dressing: Dry, sterile, compression dressing. Disposition: Patient tolerated procedure well. Patient to return in 1 week for follow-up.  No follow-ups on file.

## 2020-06-15 ENCOUNTER — Ambulatory Visit (INDEPENDENT_AMBULATORY_CARE_PROVIDER_SITE_OTHER)
Admission: RE | Admit: 2020-06-15 | Discharge: 2020-06-15 | Disposition: A | Discharge status: Home / Self Care | Payer: Medicare Other | Source: Ambulatory Visit | Attending: Vascular Surgery | Admitting: Vascular Surgery

## 2020-06-15 ENCOUNTER — Encounter: Payer: Self-pay | Admitting: *Deleted

## 2020-06-15 ENCOUNTER — Ambulatory Visit (INDEPENDENT_AMBULATORY_CARE_PROVIDER_SITE_OTHER): Payer: Medicare Other | Admitting: Physician Assistant

## 2020-06-15 ENCOUNTER — Other Ambulatory Visit: Payer: Self-pay | Admitting: *Deleted

## 2020-06-15 ENCOUNTER — Encounter (HOSPITAL_COMMUNITY): Payer: Self-pay | Admitting: Vascular Surgery

## 2020-06-15 ENCOUNTER — Other Ambulatory Visit: Payer: Self-pay

## 2020-06-15 VITALS — BP 115/68 | HR 75 | Temp 97.9°F | Resp 20 | Ht 66.0 in | Wt 136.1 lb

## 2020-06-15 DIAGNOSIS — I739 Peripheral vascular disease, unspecified: Secondary | ICD-10-CM

## 2020-06-15 DIAGNOSIS — T82898A Other specified complication of vascular prosthetic devices, implants and grafts, initial encounter: Secondary | ICD-10-CM

## 2020-06-15 DIAGNOSIS — I779 Disorder of arteries and arterioles, unspecified: Secondary | ICD-10-CM

## 2020-06-15 NOTE — Progress Notes (Signed)
Unable to reach patient via phone.  Left detailed message on machine with instructions for DOS.  PCP - Dr Mirna Mires Cardiologist - Dr Yates Decamp  Chest x-ray - n/a EKG - 09/22/19 Stress Test - 07/27/14 ECHO - n/a Cardiac Cath - n/a  Blood Thinner Instructions:  Follow your surgeon's instructions on when to stop plavix prior to surgery.  Hold Eliquis 06/15/20 tonight's dose and hold DOS dose.  Aspirin Instructions: Follow your surgeon's instructions on when to stop aspirin prior to surgery,  If no instructions were given by your surgeon then you will need to call the office for those instructions.  Anesthesia - To evaluate on DOS.  STOP now taking any Aspirin (unless otherwise instructed by your surgeon), Aleve, Naproxen, Ibuprofen, Motrin, Advil, Goody's, BC's, all herbal medications, fish oil, and all vitamins.   Coronavirus Screening Covid test on DOS.

## 2020-06-15 NOTE — Progress Notes (Signed)
[facility] [place] HISTORY AND PHYSICAL   History of Present Illness:  Patient is a 70 y.o. year old male who presents for evaluation of non healing left fifth toe.  He has had a redo left common femoral to below-knee prosthetic bypass that has already undergone thrombolysis once in the past.  He presented to the office on Tuesday with evidence of occluded bypass.  Followed by Thrombolytics catheter placed.  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  He states the toe pain wakes him up at night.  He is on ASA, Statin and Eliquis since the last intervention.  His last dose of Eliquis was this am.  Past Medical History:  Diagnosis Date  . Hypertension   . PVD (peripheral vascular disease) (HCC)     Past Surgical History:  Procedure Laterality Date  . ABDOMINAL AORTAGRAM Left 12/13/2016   ABDOMINAL AORTOGRAM W/LOWER EXTREMITY/notes 12/13/2016  . ABDOMINAL AORTOGRAM W/LOWER EXTREMITY N/A 11/28/2016   Procedure: ABDOMINAL AORTOGRAM W/LOWER EXTREMITY;  Surgeon: Yates Decamp, MD;  Location: [facility] INVASIVE CV LAB;  Service: Cardiovascular;  Laterality: N/A;  bilateral  . ABDOMINAL AORTOGRAM W/LOWER EXTREMITY N/A 12/13/2016   Procedure: ABDOMINAL AORTOGRAM W/LOWER EXTREMITY;  Surgeon: Maeola Harman, MD;  Location: [facility] INVASIVE CV LAB;  Service: Cardiovascular;  Laterality: N/A;  unilater left leg  . ABDOMINAL AORTOGRAM W/LOWER EXTREMITY N/A 01/09/2018   Procedure: ABDOMINAL AORTOGRAM W/LOWER EXTREMITY;  Surgeon: Cephus Shelling, MD;  Location: [facility] INVASIVE CV LAB;  Service: Cardiovascular;  Laterality: N/A;  . ABDOMINAL AORTOGRAM W/LOWER EXTREMITY Left 09/22/2019   Procedure: ABDOMINAL AORTOGRAM W/LOWER EXTREMITY;  Surgeon: Maeola Harman, MD;  Location: [facility] INVASIVE CV LAB;  Service: Cardiovascular;  Laterality: Left;  . FEMORAL-POPLITEAL BYPASS GRAFT Left 12/14/2016   Procedure: BYPASS GRAFT  COMMON FEMORAL- BELOW KNEE POPLITEAL ARTERY with Bovine Patch to Below the knee poplitieal artery.;  Surgeon: Fransisco Hertz, MD;  Location: [facility] OR;  Service: Vascular;  Laterality: Left;  . FEMORAL-POPLITEAL BYPASS GRAFT Left 01/28/2018   Procedure: BYPASS GRAFT FEMORAL-BELOW KNEE POPLITEAL ARTERY REDO with propaten vascular graft removable ring;  Surgeon: Cephus Shelling, MD;  Location: [facility] OR;  Service: Vascular;  Laterality: Left;  . FEMUR SURGERY Right 2009   pt. has a rod in that leg  . LOWER EXTREMITY ANGIOGRAPHY Left 11/28/2016   Procedure: Lower Extremity Angiography;  Surgeon: Yates Decamp, MD;  Location: [facility] INVASIVE CV LAB;  Service: Cardiovascular;  Laterality: Left;  lysis followup lower leg  . PATCH ANGIOPLASTY Left 01/28/2018   Procedure: PATCH ANGIOPLASTY USING Livia Snellen BIOLOGIC PATCH;  Surgeon: Cephus Shelling, MD;  Location: [facility] OR;  Service: Vascular;  Laterality: Left;  . PERIPHERAL VASCULAR BALLOON ANGIOPLASTY  11/28/2016   Procedure: PERIPHERAL VASCULAR BALLOON ANGIOPLASTY;  Surgeon: Yates Decamp, MD;  Location: [facility] INVASIVE CV LAB;  Service: Cardiovascular;;  SFA  . PERIPHERAL VASCULAR CATHETERIZATION N/A 08/11/2014   Procedure: Lower Extremity Angiography;  Surgeon: Yates Decamp, MD;  Location: [facility] INVASIVE CV LAB;  Service: Cardiovascular;  Laterality: N/A;  . PERIPHERAL VASCULAR INTERVENTION Left 09/22/2019   Procedure: PERIPHERAL VASCULAR INTERVENTION;  Surgeon: Maeola Harman, MD;  Location: [facility] INVASIVE CV LAB;  Service: Cardiovascular;  Laterality: Left;  . PERIPHERAL VASCULAR INTERVENTION Left 09/23/2019   Procedure: PERIPHERAL VASCULAR INTERVENTION;  Surgeon: Nada Libman, MD;  Location: [facility] INVASIVE CV LAB;  Service: Cardiovascular;  Laterality: Left;  . PERIPHERAL VASCULAR INTERVENTION Left  05/13/2020   Procedure: PERIPHERAL VASCULAR INTERVENTION;  Surgeon: Cephus Shelling, MD;  Location: [facility] INVASIVE CV LAB;  Service: Cardiovascular;  Laterality: Left;   . PERIPHERAL VASCULAR THROMBECTOMY  11/28/2016   Procedure: PERIPHERAL VASCULAR THROMBECTOMY;  Surgeon: Yates Decamp, MD;  Location: [facility] INVASIVE CV LAB;  Service: Cardiovascular;;  Profunda  . PERIPHERAL VASCULAR THROMBECTOMY  11/28/2016   Procedure: PERIPHERAL VASCULAR THROMBECTOMY;  Surgeon: Yates Decamp, MD;  Location: [facility] INVASIVE CV LAB;  Service: Cardiovascular;;  . PERIPHERAL VASCULAR THROMBECTOMY N/A 05/12/2020   Procedure: PERIPHERAL VASCULAR THROMBECTOMY-Left Leg;  Surgeon: Leonie Douglas, MD;  Location: [facility] INVASIVE CV LAB;  Service: Cardiovascular;  Laterality: N/A;  . PERIPHERAL VASCULAR THROMBECTOMY N/A 05/13/2020   Procedure: PERIPHERAL VASCULAR THROMBECTOMY- LYSIS RECHECK;  Surgeon: Cephus Shelling, MD;  Location: [facility] INVASIVE CV LAB;  Service: Cardiovascular;  Laterality: N/A;  . PULMONARY THROMBECTOMY N/A 09/23/2019   Procedure: LYSIS RECHECK;  Surgeon: Nada Libman, MD;  Location: [facility] INVASIVE CV LAB;  Service: Cardiovascular;  Laterality: N/A;  . THROMBECTOMY FEMORAL ARTERY Left 12/14/2016   Procedure: THROMBECTOMY PROFUNDA FEMORAL ARTERY;  Surgeon: Fransisco Hertz, MD;  Location: [facility] OR;  Service: Vascular;  Laterality: Left;    ROS:   General:  No weight loss, Fever, chills  HEENT: No recent headaches, no nasal bleeding, no visual changes, no sore throat  Neurologic: No dizziness, blackouts, seizures. No recent symptoms of stroke or mini- stroke. No recent episodes of slurred speech, or temporary blindness.  Cardiac: No recent episodes of chest pain/pressure, no shortness of breath at rest.  No shortness of breath with exertion.  Denies history of atrial fibrillation or irregular heartbeat  Vascular: No history of rest pain in feet.  No history of claudication.  No history of non-healing ulcer, No history of DVT   Pulmonary: No home oxygen, no productive cough, no hemoptysis,  No asthma or wheezing  Musculoskeletal:  [ ]  Arthritis, [ ]  Low back pain,  [ ]  Joint  pain  Hematologic:No history of hypercoagulable state.  No history of easy bleeding.  No history of anemia  Gastrointestinal: No hematochezia or melena,  No gastroesophageal reflux, no trouble swallowing  Urinary: [ ]  chronic Kidney disease, [ ]  on HD - [ ]  MWF or [ ]  TTHS, [ ]  Burning with urination, [ ]  Frequent urination, [ ]  Difficulty urinating;   Skin: No rashes  Psychological: No history of anxiety,  No history of depression  Social History Social History   Tobacco Use  . Smoking status: Current Every Day Smoker    Packs/day: 0.75    Years: 56.00    Pack years: 42.00    Types: Cigarettes  . Smokeless tobacco: Never Used  Vaping Use  . Vaping Use: Never used  Substance Use Topics  . Alcohol use: Yes    Alcohol/week: 10.0 standard drinks    Types: 10 Shots of liquor per week    Comment:  01/29/2018 "1-2 shots per night"  . Drug use: No    Family History Family History  Problem Relation Age of Onset  . Hypertension Mother   . Diabetes Mother   . Hypertension Father     Allergies  No Known Allergies   Current Outpatient Medications  Medication Sig Dispense Refill  . apixaban (ELIQUIS) 5 MG TABS tablet Take 1 tablet (5 mg total) by mouth 2 (two) times daily. 60 tablet 0  . aspirin EC 81 MG tablet Take 81 mg by mouth daily. Swallow whole.     clopidogrel (PLAVIX) 75 MG tablet Take 1 tablet (75 mg total) by mouth daily. 30 tablet 11  . gabapentin (NEURONTIN) 100 MG capsule Take 1 capsule (100 mg total) by mouth at bedtime. 30 capsule 0  . ketoconazole (NIZORAL) 2 % cream Apply to toes and interspaces twice daily for dead skin/ itching 60 g 2  . losartan-hydrochlorothiazide (HYZAAR) 100-25 MG tablet Take 1 tablet by mouth daily.     Marland Kitchen oxyCODONE-acetaminophen (PERCOCET) 5-325 MG tablet Take 1 tablet by mouth every 6 (six) hours as needed for severe pain. 20 tablet 0  . rosuvastatin (CRESTOR) 10 MG tablet Take 1 tablet (10 mg total) by mouth daily. 30 tablet 3    No current facility-administered medications for this visit.    Physical Examination  Vitals:   06/15/20 1508  BP: 115/68  Pulse: 75  Resp: 20  Temp: 97.9 F (36.6 C)  TempSrc: Temporal  SpO2: 100%  Weight: 136 lb 1.6 oz (61.7 kg)  Height: 5\' 6"  (1.676 m)    Body mass index is 21.97 kg/m.  General:  Alert and oriented, no acute distress HEENT: Normal Neck: No bruit or JVD Pulmonary: Clear to auscultation bilaterally Cardiac: Regular Rate and Rhythm without murmur Abdomen: Soft, non-tender, non-distended, no mass, no scars Skin: No rash, toes/forefoot cool and painful to touch.       Musculoskeletal: No deformity or edema  Neurologic: Upper and lower extremity motor 5/5 and symmetric  DATA:  The bypass duplex shows occlusion of the bypass  Left ABI 0.30 and TBI 0  ASSESSMENT/Plan: Left fem-pop bypass occluded within 4 weeks of bypass thrombectomy.  He has a non healing wound on the fifth toe with rest pain.  Dr. reviewed the studies and examined the patient.  He offered repeat thrombectomy 06/16/20.  He will hold his Eliquis tonight and tomorrow.        06/18/20 PA-C [facility] [place] Office: [phone]  MD in office [phone]

## 2020-06-16 ENCOUNTER — Encounter (HOSPITAL_COMMUNITY)
Admission: RE | Disposition: A | Discharge status: Home / Self Care | Payer: Self-pay | Source: Home / Self Care | Attending: Vascular Surgery

## 2020-06-16 ENCOUNTER — Inpatient Hospital Stay (HOSPITAL_COMMUNITY)
Admission: RE | Admit: 2020-06-16 | Discharge: 2020-06-18 | DRG: 253 | Disposition: A | Discharge status: Home / Self Care | Payer: Medicare Other | Attending: Vascular Surgery | Admitting: Vascular Surgery

## 2020-06-16 ENCOUNTER — Inpatient Hospital Stay (HOSPITAL_COMMUNITY): Payer: Medicare Other | Admitting: Anesthesiology

## 2020-06-16 DIAGNOSIS — D62 Acute posthemorrhagic anemia: Secondary | ICD-10-CM | POA: Diagnosis not present

## 2020-06-16 DIAGNOSIS — Z20822 Contact with and (suspected) exposure to covid-19: Secondary | ICD-10-CM | POA: Diagnosis present

## 2020-06-16 DIAGNOSIS — I96 Gangrene, not elsewhere classified: Secondary | ICD-10-CM | POA: Diagnosis not present

## 2020-06-16 DIAGNOSIS — T82868A Thrombosis of vascular prosthetic devices, implants and grafts, initial encounter: Secondary | ICD-10-CM | POA: Diagnosis not present

## 2020-06-16 DIAGNOSIS — Z8249 Family history of ischemic heart disease and other diseases of the circulatory system: Secondary | ICD-10-CM | POA: Diagnosis not present

## 2020-06-16 DIAGNOSIS — I10 Essential (primary) hypertension: Secondary | ICD-10-CM | POA: Diagnosis not present

## 2020-06-16 DIAGNOSIS — I743 Embolism and thrombosis of arteries of the lower extremities: Secondary | ICD-10-CM | POA: Diagnosis not present

## 2020-06-16 DIAGNOSIS — Z79899 Other long term (current) drug therapy: Secondary | ICD-10-CM

## 2020-06-16 DIAGNOSIS — Z7982 Long term (current) use of aspirin: Secondary | ICD-10-CM | POA: Diagnosis not present

## 2020-06-16 DIAGNOSIS — F17210 Nicotine dependence, cigarettes, uncomplicated: Secondary | ICD-10-CM | POA: Diagnosis present

## 2020-06-16 DIAGNOSIS — I739 Peripheral vascular disease, unspecified: Secondary | ICD-10-CM | POA: Diagnosis present

## 2020-06-16 DIAGNOSIS — Z7902 Long term (current) use of antithrombotics/antiplatelets: Secondary | ICD-10-CM

## 2020-06-16 DIAGNOSIS — Y832 Surgical operation with anastomosis, bypass or graft as the cause of abnormal reaction of the patient, or of later complication, without mention of misadventure at the time of the procedure: Secondary | ICD-10-CM | POA: Diagnosis present

## 2020-06-16 DIAGNOSIS — L97529 Non-pressure chronic ulcer of other part of left foot with unspecified severity: Secondary | ICD-10-CM | POA: Diagnosis present

## 2020-06-16 HISTORY — PX: PATCH ANGIOPLASTY: SHX6230

## 2020-06-16 HISTORY — PX: THROMBECTOMY OF BYPASS GRAFT FEMORAL- POPLITEAL ARTERY: SHX6902

## 2020-06-16 HISTORY — PX: THROMBECTOMY FEMORAL ARTERY: SHX6406

## 2020-06-16 LAB — CBC
HCT: 45.2 % (ref 39.0–52.0)
Hemoglobin: 13.9 g/dL (ref 13.0–17.0)
MCH: 26.8 pg (ref 26.0–34.0)
MCHC: 30.8 g/dL (ref 30.0–36.0)
MCV: 87.1 fL (ref 80.0–100.0)
Platelets: 256 10*3/uL (ref 150–400)
RBC: 5.19 MIL/uL (ref 4.22–5.81)
RDW: 15.4 % (ref 11.5–15.5)
WBC: 5.9 10*3/uL (ref 4.0–10.5)
nRBC: 0 % (ref 0.0–0.2)

## 2020-06-16 LAB — URINALYSIS, ROUTINE W REFLEX MICROSCOPIC
Bilirubin Urine: NEGATIVE
Glucose, UA: NEGATIVE mg/dL
Hgb urine dipstick: NEGATIVE
Ketones, ur: NEGATIVE mg/dL
Leukocytes,Ua: NEGATIVE
Nitrite: NEGATIVE
Protein, ur: NEGATIVE mg/dL
Specific Gravity, Urine: 1.015 (ref 1.005–1.030)
pH: 6 (ref 5.0–8.0)

## 2020-06-16 LAB — COMPREHENSIVE METABOLIC PANEL
ALT: 18 U/L (ref 0–44)
AST: 24 U/L (ref 15–41)
Albumin: 4.5 g/dL (ref 3.5–5.0)
Alkaline Phosphatase: 72 U/L (ref 38–126)
Anion gap: 9 (ref 5–15)
BUN: 23 mg/dL (ref 8–23)
CO2: 21 mmol/L — ABNORMAL LOW (ref 22–32)
Calcium: 9.8 mg/dL (ref 8.9–10.3)
Chloride: 106 mmol/L (ref 98–111)
Creatinine, Ser: 1.08 mg/dL (ref 0.61–1.24)
GFR, Estimated: >60 mL/min (ref >60)
Glucose, Bld: 113 mg/dL — ABNORMAL HIGH (ref 70–99)
Potassium: 3.7 mmol/L (ref 3.5–5.1)
Sodium: 136 mmol/L (ref 135–145)
Total Bilirubin: 0.6 mg/dL (ref 0.3–1.2)
Total Protein: 8.1 g/dL (ref 6.5–8.1)

## 2020-06-16 LAB — SURGICAL PCR SCREEN
MRSA, PCR: NEGATIVE
Staphylococcus aureus: NEGATIVE

## 2020-06-16 LAB — PROTIME-INR
INR: 1 (ref 0.8–1.2)
Prothrombin Time: 12.8 seconds (ref 11.4–15.2)

## 2020-06-16 LAB — NO BLOOD PRODUCTS

## 2020-06-16 LAB — SARS CORONAVIRUS 2 BY RT PCR (HOSPITAL ORDER, PERFORMED IN ~~LOC~~ HOSPITAL LAB): SARS Coronavirus 2: NEGATIVE

## 2020-06-16 LAB — APTT: aPTT: 28 seconds (ref 24–36)

## 2020-06-16 SURGERY — THROMBECTOMY, ARTERY, FEMORAL
Anesthesia: General | Site: Leg Lower | Laterality: Left

## 2020-06-16 MED ORDER — MAGNESIUM SULFATE 2 GM/50ML IV SOLN: 2.0000 g | Freq: Every day | INTRAVENOUS | Status: DC | PRN

## 2020-06-16 MED ORDER — GUAIFENESIN-DM 100-10 MG/5ML PO SYRP: 15.0000 mL | ORAL_SOLUTION | ORAL | Status: DC | PRN

## 2020-06-16 MED ORDER — POTASSIUM CHLORIDE CRYS ER 20 MEQ PO TBCR: 20.0000 meq | EXTENDED_RELEASE_TABLET | Freq: Every day | ORAL | Status: DC | PRN

## 2020-06-16 MED ORDER — METOPROLOL TARTRATE 5 MG/5ML IV SOLN: 2.0000 mg | INTRAVENOUS | Status: DC | PRN

## 2020-06-16 MED ORDER — HEMOSTATIC AGENTS (NO CHARGE) OPTIME
TOPICAL | Status: DC | PRN
  Administered 2020-06-16: 1 via TOPICAL

## 2020-06-16 MED ORDER — CHLORHEXIDINE GLUCONATE CLOTH 2 % EX PADS
6.0000 | MEDICATED_PAD | Freq: Once | CUTANEOUS | Status: AC
  Administered 2020-06-16: 6 via TOPICAL

## 2020-06-16 MED ORDER — BISACODYL 5 MG PO TBEC: 5.0000 mg | DELAYED_RELEASE_TABLET | Freq: Every day | ORAL | Status: DC | PRN

## 2020-06-16 MED ORDER — PROTAMINE SULFATE 10 MG/ML IV SOLN
INTRAVENOUS | Status: AC
  Filled 2020-06-16: qty 10

## 2020-06-16 MED ORDER — 0.9 % SODIUM CHLORIDE (POUR BTL) OPTIME
TOPICAL | Status: DC | PRN
  Administered 2020-06-16: 2000 mL

## 2020-06-16 MED ORDER — ORAL CARE MOUTH RINSE: 15.0000 mL | Freq: Once | OROMUCOSAL | Status: AC

## 2020-06-16 MED ORDER — PROTAMINE SULFATE 10 MG/ML IV SOLN
INTRAVENOUS | Status: DC | PRN
  Administered 2020-06-16: 10 mg via INTRAVENOUS
  Administered 2020-06-16: 20 mg via INTRAVENOUS

## 2020-06-16 MED ORDER — PANTOPRAZOLE SODIUM 40 MG PO TBEC
40.0000 mg | DELAYED_RELEASE_TABLET | Freq: Every day | ORAL | Status: DC
  Administered 2020-06-17 – 2020-06-18 (×2): 40 mg via ORAL
  Filled 2020-06-16 (×2): qty 1

## 2020-06-16 MED ORDER — ONDANSETRON HCL 4 MG/2ML IJ SOLN
4.0000 mg | Freq: Four times a day (QID) | INTRAMUSCULAR | Status: DC | PRN
  Administered 2020-06-17: 4 mg via INTRAVENOUS
  Filled 2020-06-16: qty 2

## 2020-06-16 MED ORDER — HYDRALAZINE HCL 20 MG/ML IJ SOLN: 5.0000 mg | INTRAMUSCULAR | Status: DC | PRN

## 2020-06-16 MED ORDER — LOSARTAN POTASSIUM-HCTZ 100-25 MG PO TABS: 1.0000 | ORAL_TABLET | Freq: Every day | ORAL | Status: DC

## 2020-06-16 MED ORDER — HEPARIN SODIUM (PORCINE) 1000 UNIT/ML IJ SOLN
INTRAMUSCULAR | Status: DC | PRN
  Administered 2020-06-16: 6000 [IU] via INTRAVENOUS

## 2020-06-16 MED ORDER — LOSARTAN POTASSIUM 50 MG PO TABS
100.0000 mg | ORAL_TABLET | Freq: Every day | ORAL | Status: DC
  Administered 2020-06-17: 100 mg via ORAL
  Filled 2020-06-16 (×2): qty 2

## 2020-06-16 MED ORDER — HYDROMORPHONE HCL 1 MG/ML IJ SOLN
INTRAMUSCULAR | Status: AC
  Filled 2020-06-16: qty 1

## 2020-06-16 MED ORDER — ROSUVASTATIN CALCIUM 5 MG PO TABS
10.0000 mg | ORAL_TABLET | Freq: Every day | ORAL | Status: DC
  Administered 2020-06-17 – 2020-06-18 (×2): 10 mg via ORAL
  Filled 2020-06-16 (×2): qty 2

## 2020-06-16 MED ORDER — SODIUM CHLORIDE 0.9 % IV SOLN
INTRAVENOUS | Status: AC
  Filled 2020-06-16: qty 1.2

## 2020-06-16 MED ORDER — ONDANSETRON HCL 4 MG/2ML IJ SOLN
INTRAMUSCULAR | Status: DC | PRN
  Administered 2020-06-16: 4 mg via INTRAVENOUS

## 2020-06-16 MED ORDER — CHLORHEXIDINE GLUCONATE CLOTH 2 % EX PADS: 6.0000 | MEDICATED_PAD | Freq: Once | CUTANEOUS | Status: DC

## 2020-06-16 MED ORDER — PHENOL 1.4 % MT LIQD
1.0000 | OROMUCOSAL | Status: DC | PRN
Start: 2020-06-16 — End: 2020-06-18

## 2020-06-16 MED ORDER — SODIUM CHLORIDE 0.9 % IV SOLN: INTRAVENOUS | Status: DC

## 2020-06-16 MED ORDER — PHENYLEPHRINE 40 MCG/ML (10ML) SYRINGE FOR IV PUSH (FOR BLOOD PRESSURE SUPPORT)
PREFILLED_SYRINGE | INTRAVENOUS | Status: AC
  Filled 2020-06-16: qty 10

## 2020-06-16 MED ORDER — ACETAMINOPHEN 650 MG RE SUPP
325.0000 mg | RECTAL | Status: DC | PRN
Start: 2020-06-16 — End: 2020-06-18

## 2020-06-16 MED ORDER — ROCURONIUM BROMIDE 100 MG/10ML IV SOLN
INTRAVENOUS | Status: DC | PRN
  Administered 2020-06-16: 50 mg via INTRAVENOUS
  Administered 2020-06-16: 10 mg via INTRAVENOUS

## 2020-06-16 MED ORDER — SODIUM CHLORIDE 0.9 % IV SOLN
INTRAVENOUS | Status: DC | PRN
  Administered 2020-06-16: 500 mL

## 2020-06-16 MED ORDER — CHLORHEXIDINE GLUCONATE 0.12 % MT SOLN
OROMUCOSAL | Status: AC
  Administered 2020-06-16: 15 mL via OROMUCOSAL
  Filled 2020-06-16: qty 15

## 2020-06-16 MED ORDER — ALUM & MAG HYDROXIDE-SIMETH 200-200-20 MG/5ML PO SUSP
15.0000 mL | ORAL | Status: DC | PRN
  Administered 2020-06-17: 15 mL via ORAL
  Filled 2020-06-16: qty 30

## 2020-06-16 MED ORDER — HYDROMORPHONE HCL 1 MG/ML IJ SOLN
0.5000 mg | INTRAMUSCULAR | Status: DC | PRN
  Administered 2020-06-16 – 2020-06-17 (×3): 1 mg via INTRAVENOUS
  Filled 2020-06-16 (×3): qty 1

## 2020-06-16 MED ORDER — PROPOFOL 10 MG/ML IV BOLUS
INTRAVENOUS | Status: AC
  Filled 2020-06-16: qty 20

## 2020-06-16 MED ORDER — HEPARIN SODIUM (PORCINE) 1000 UNIT/ML IJ SOLN
INTRAMUSCULAR | Status: AC
  Filled 2020-06-16: qty 2

## 2020-06-16 MED ORDER — ACETAMINOPHEN 325 MG PO TABS: 325.0000 mg | ORAL_TABLET | ORAL | Status: DC | PRN

## 2020-06-16 MED ORDER — PHENYLEPHRINE HCL (PRESSORS) 10 MG/ML IV SOLN
INTRAVENOUS | Status: DC | PRN
  Administered 2020-06-16: 100 ug via INTRAVENOUS

## 2020-06-16 MED ORDER — LIDOCAINE 2% (20 MG/ML) 5 ML SYRINGE
INTRAMUSCULAR | Status: AC
  Filled 2020-06-16: qty 10

## 2020-06-16 MED ORDER — OXYCODONE-ACETAMINOPHEN 5-325 MG PO TABS
1.0000 | ORAL_TABLET | ORAL | Status: DC | PRN
  Administered 2020-06-17 – 2020-06-18 (×5): 2 via ORAL
  Filled 2020-06-16 (×5): qty 2

## 2020-06-16 MED ORDER — DEXAMETHASONE SODIUM PHOSPHATE 10 MG/ML IJ SOLN
INTRAMUSCULAR | Status: AC
  Filled 2020-06-16: qty 1

## 2020-06-16 MED ORDER — ASPIRIN EC 81 MG PO TBEC
81.0000 mg | DELAYED_RELEASE_TABLET | Freq: Every day | ORAL | Status: DC
  Administered 2020-06-17 – 2020-06-18 (×2): 81 mg via ORAL
  Filled 2020-06-16 (×2): qty 1

## 2020-06-16 MED ORDER — MIDAZOLAM HCL 2 MG/2ML IJ SOLN: 0.5000 mg | Freq: Once | INTRAMUSCULAR | Status: DC | PRN

## 2020-06-16 MED ORDER — LACTATED RINGERS IV SOLN: INTRAVENOUS | Status: DC

## 2020-06-16 MED ORDER — LABETALOL HCL 5 MG/ML IV SOLN: 10.0000 mg | INTRAVENOUS | Status: DC | PRN

## 2020-06-16 MED ORDER — CEFAZOLIN SODIUM-DEXTROSE 2-4 GM/100ML-% IV SOLN
2.0000 g | INTRAVENOUS | Status: AC
  Administered 2020-06-16: 2 g via INTRAVENOUS

## 2020-06-16 MED ORDER — CHLORHEXIDINE GLUCONATE 0.12 % MT SOLN: 15.0000 mL | Freq: Once | OROMUCOSAL | Status: AC

## 2020-06-16 MED ORDER — HEPARIN (PORCINE) 25000 UT/250ML-% IV SOLN
1000.0000 [IU]/h | INTRAVENOUS | Status: DC
  Administered 2020-06-17: 1000 [IU]/h via INTRAVENOUS
  Filled 2020-06-16: qty 250

## 2020-06-16 MED ORDER — PROPOFOL 10 MG/ML IV BOLUS
INTRAVENOUS | Status: DC | PRN
  Administered 2020-06-16: 150 mg via INTRAVENOUS

## 2020-06-16 MED ORDER — SODIUM CHLORIDE 0.9 % IV SOLN: 500.0000 mL | Freq: Once | INTRAVENOUS | Status: DC | PRN

## 2020-06-16 MED ORDER — DEXAMETHASONE SODIUM PHOSPHATE 10 MG/ML IJ SOLN
INTRAMUSCULAR | Status: DC | PRN
  Administered 2020-06-16: 5 mg via INTRAVENOUS

## 2020-06-16 MED ORDER — HYDROMORPHONE HCL 1 MG/ML IJ SOLN
0.2500 mg | INTRAMUSCULAR | Status: DC | PRN
  Administered 2020-06-16: 0.25 mg via INTRAVENOUS
  Administered 2020-06-16: 0.5 mg via INTRAVENOUS

## 2020-06-16 MED ORDER — OXYCODONE HCL 5 MG/5ML PO SOLN: 5.0000 mg | Freq: Once | ORAL | Status: DC | PRN

## 2020-06-16 MED ORDER — MEPERIDINE HCL 25 MG/ML IJ SOLN: 6.2500 mg | INTRAMUSCULAR | Status: DC | PRN

## 2020-06-16 MED ORDER — LIDOCAINE 2% (20 MG/ML) 5 ML SYRINGE
INTRAMUSCULAR | Status: DC | PRN
  Administered 2020-06-16: 30 mg via INTRAVENOUS

## 2020-06-16 MED ORDER — FLEET ENEMA 7-19 GM/118ML RE ENEM: 1.0000 | ENEMA | Freq: Once | RECTAL | Status: DC | PRN

## 2020-06-16 MED ORDER — LACTATED RINGERS IV SOLN: INTRAVENOUS | Status: DC | PRN

## 2020-06-16 MED ORDER — ALBUMIN HUMAN 5 % IV SOLN: INTRAVENOUS | Status: DC | PRN

## 2020-06-16 MED ORDER — SENNOSIDES-DOCUSATE SODIUM 8.6-50 MG PO TABS: 1.0000 | ORAL_TABLET | Freq: Every evening | ORAL | Status: DC | PRN

## 2020-06-16 MED ORDER — ROCURONIUM BROMIDE 10 MG/ML (PF) SYRINGE
PREFILLED_SYRINGE | INTRAVENOUS | Status: AC
  Filled 2020-06-16: qty 20

## 2020-06-16 MED ORDER — CEFAZOLIN SODIUM-DEXTROSE 2-4 GM/100ML-% IV SOLN
2.0000 g | Freq: Three times a day (TID) | INTRAVENOUS | Status: AC
  Administered 2020-06-17 (×2): 2 g via INTRAVENOUS
  Filled 2020-06-16 (×2): qty 100

## 2020-06-16 MED ORDER — SUGAMMADEX SODIUM 200 MG/2ML IV SOLN
INTRAVENOUS | Status: DC | PRN
  Administered 2020-06-16: 200 mg via INTRAVENOUS

## 2020-06-16 MED ORDER — OXYCODONE HCL 5 MG PO TABS: 5.0000 mg | ORAL_TABLET | Freq: Once | ORAL | Status: DC | PRN

## 2020-06-16 MED ORDER — FENTANYL CITRATE (PF) 250 MCG/5ML IJ SOLN
INTRAMUSCULAR | Status: AC
  Filled 2020-06-16: qty 5

## 2020-06-16 MED ORDER — CLOPIDOGREL BISULFATE 75 MG PO TABS
75.0000 mg | ORAL_TABLET | Freq: Every day | ORAL | Status: DC
  Administered 2020-06-17 – 2020-06-18 (×2): 75 mg via ORAL
  Filled 2020-06-16 (×2): qty 1

## 2020-06-16 MED ORDER — PHENYLEPHRINE HCL-NACL 10-0.9 MG/250ML-% IV SOLN
INTRAVENOUS | Status: DC | PRN
  Administered 2020-06-16: 50 ug/min via INTRAVENOUS

## 2020-06-16 MED ORDER — PROMETHAZINE HCL 25 MG/ML IJ SOLN: 6.2500 mg | INTRAMUSCULAR | Status: DC | PRN

## 2020-06-16 MED ORDER — HYDROCHLOROTHIAZIDE 25 MG PO TABS
25.0000 mg | ORAL_TABLET | Freq: Every day | ORAL | Status: DC
  Administered 2020-06-17 – 2020-06-18 (×2): 25 mg via ORAL
  Filled 2020-06-16 (×2): qty 1

## 2020-06-16 MED ORDER — EPHEDRINE 5 MG/ML INJ
INTRAVENOUS | Status: AC
  Filled 2020-06-16: qty 10

## 2020-06-16 MED ORDER — CEFAZOLIN SODIUM-DEXTROSE 2-4 GM/100ML-% IV SOLN
INTRAVENOUS | Status: AC
  Filled 2020-06-16: qty 100

## 2020-06-16 MED ORDER — ONDANSETRON HCL 4 MG/2ML IJ SOLN
INTRAMUSCULAR | Status: AC
  Filled 2020-06-16: qty 4

## 2020-06-16 MED ORDER — FENTANYL CITRATE (PF) 100 MCG/2ML IJ SOLN
INTRAMUSCULAR | Status: DC | PRN
  Administered 2020-06-16: 100 ug via INTRAVENOUS

## 2020-06-16 MED ORDER — DOCUSATE SODIUM 100 MG PO CAPS
100.0000 mg | ORAL_CAPSULE | Freq: Every day | ORAL | Status: DC
  Administered 2020-06-17 – 2020-06-18 (×2): 100 mg via ORAL
  Filled 2020-06-16 (×2): qty 1

## 2020-06-16 SURGICAL SUPPLY — 66 items
BANDAGE ESMARK 6X9 LF (GAUZE/BANDAGES/DRESSINGS) ×1 IMPLANT
BNDG ELASTIC 4X5.8 VLCR STR LF (GAUZE/BANDAGES/DRESSINGS) ×2 IMPLANT
BNDG ESMARK 6X9 LF (GAUZE/BANDAGES/DRESSINGS) ×2
BNDG GAUZE ELAST 4 BULKY (GAUZE/BANDAGES/DRESSINGS) ×2 IMPLANT
CANISTER SUCT 3000ML PPV (MISCELLANEOUS) ×2 IMPLANT
CATH EMB 2FR 60CM (CATHETERS) ×2 IMPLANT
CATH EMB 3FR 80CM (CATHETERS) ×2 IMPLANT
CATH EMB 4FR 80CM (CATHETERS) ×8 IMPLANT
CATH EMB 5FR 80CM (CATHETERS) ×4 IMPLANT
CLIP VESOCCLUDE MED 24/CT (CLIP) IMPLANT
CLIP VESOCCLUDE MED 6/CT (CLIP) ×2 IMPLANT
CLIP VESOCCLUDE MED LG 6/CT (CLIP) ×2 IMPLANT
CLIP VESOCCLUDE SM WIDE 24/CT (CLIP) IMPLANT
COVER PROBE W GEL 5X96 (DRAPES) ×2 IMPLANT
COVER WAND RF STERILE (DRAPES) IMPLANT
CUFF TOURN SGL QUICK 18X4 (TOURNIQUET CUFF) ×2 IMPLANT
CUFF TOURN SGL QUICK 24 (TOURNIQUET CUFF)
CUFF TOURN SGL QUICK 34 (TOURNIQUET CUFF)
CUFF TOURN SGL QUICK 42 (TOURNIQUET CUFF) IMPLANT
CUFF TRNQT CYL 24X4X16.5-23 (TOURNIQUET CUFF) IMPLANT
CUFF TRNQT CYL 34X4.125X (TOURNIQUET CUFF) IMPLANT
DERMABOND ADVANCED (GAUZE/BANDAGES/DRESSINGS) ×1
DERMABOND ADVANCED .7 DNX12 (GAUZE/BANDAGES/DRESSINGS) ×1 IMPLANT
DRAIN CHANNEL 15F RND FF W/TCR (WOUND CARE) IMPLANT
DRAPE C-ARM 42X72 X-RAY (DRAPES) IMPLANT
ELECT REM PT RETURN 9FT ADLT (ELECTROSURGICAL) ×2
ELECTRODE REM PT RTRN 9FT ADLT (ELECTROSURGICAL) ×1 IMPLANT
EVACUATOR SILICONE 100CC (DRAIN) IMPLANT
GAUZE SPONGE 4X4 12PLY STRL (GAUZE/BANDAGES/DRESSINGS) ×2 IMPLANT
GLOVE SRG 8 PF TXTR STRL LF DI (GLOVE) ×1 IMPLANT
GLOVE SURG UNDER POLY LF SZ8 (GLOVE) ×1
GOWN STRL REUS W/ TWL LRG LVL3 (GOWN DISPOSABLE) ×2 IMPLANT
GOWN STRL REUS W/ TWL XL LVL3 (GOWN DISPOSABLE) ×2 IMPLANT
GOWN STRL REUS W/TWL LRG LVL3 (GOWN DISPOSABLE) ×2
GOWN STRL REUS W/TWL XL LVL3 (GOWN DISPOSABLE) ×2
HEMOSTAT SPONGE AVITENE ULTRA (HEMOSTASIS) IMPLANT
INSERT FOGARTY SM (MISCELLANEOUS) IMPLANT
KIT BASIN OR (CUSTOM PROCEDURE TRAY) ×2 IMPLANT
KIT TURNOVER KIT B (KITS) ×2 IMPLANT
NS IRRIG 1000ML POUR BTL (IV SOLUTION) ×4 IMPLANT
PACK PERIPHERAL VASCULAR (CUSTOM PROCEDURE TRAY) ×2 IMPLANT
PAD ARMBOARD 7.5X6 YLW CONV (MISCELLANEOUS) ×4 IMPLANT
PATCH VASC XENOSURE 1CMX6CM (Vascular Products) ×1 IMPLANT
PATCH VASC XENOSURE 1X6 (Vascular Products) ×1 IMPLANT
STAPLER VISISTAT 35W (STAPLE) IMPLANT
STOPCOCK 4 WAY LG BORE MALE ST (IV SETS) IMPLANT
SUT ETHILON 3 0 PS 1 (SUTURE) IMPLANT
SUT GORETEX 5 0 TT13 24 (SUTURE) IMPLANT
SUT GORETEX 6.0 TT13 (SUTURE) IMPLANT
SUT MNCRL AB 4-0 PS2 18 (SUTURE) ×2 IMPLANT
SUT PROLENE 5 0 C 1 24 (SUTURE) ×2 IMPLANT
SUT PROLENE 6 0 BV (SUTURE) ×16 IMPLANT
SUT PROLENE 7 0 BV 1 (SUTURE) IMPLANT
SUT SILK 2 0 PERMA HAND 18 BK (SUTURE) IMPLANT
SUT SILK 3 0 (SUTURE)
SUT SILK 3-0 18XBRD TIE 12 (SUTURE) IMPLANT
SUT VIC AB 2-0 CT1 27 (SUTURE) ×1
SUT VIC AB 2-0 CT1 TAPERPNT 27 (SUTURE) ×1 IMPLANT
SUT VIC AB 3-0 SH 27 (SUTURE) ×1
SUT VIC AB 3-0 SH 27X BRD (SUTURE) ×1 IMPLANT
SYR 3ML LL SCALE MARK (SYRINGE) ×2 IMPLANT
TOWEL GREEN STERILE (TOWEL DISPOSABLE) ×2 IMPLANT
TRAY FOLEY MTR SLVR 16FR STAT (SET/KITS/TRAYS/PACK) IMPLANT
TUBING EXTENTION W/L.L. (IV SETS) IMPLANT
UNDERPAD 30X36 HEAVY ABSORB (UNDERPADS AND DIAPERS) ×2 IMPLANT
WATER STERILE IRR 1000ML POUR (IV SOLUTION) ×2 IMPLANT

## 2020-06-16 NOTE — Anesthesia Preprocedure Evaluation (Addendum)
Anesthesia Evaluation  Patient identified by MRN, date of birth, ID band Patient awake    Reviewed: Allergy & Precautions, NPO status , Patient's Chart, lab work & pertinent test results  History of Anesthesia Complications Negative for: history of anesthetic complications  Airway Mallampati: I  TM Distance: >3 FB Neck ROM: Full    Dental  (+) Edentulous Upper, Edentulous Lower   Pulmonary Current Smoker,  06/16/2020 SARS coronavirus NEG   breath sounds clear to auscultation       Cardiovascular hypertension, Pt. on medications (-) angina+ Peripheral Vascular Disease   Rhythm:Regular Rate:Normal     Neuro/Psych negative neurological ROS     GI/Hepatic negative GI ROS, Neg liver ROS,   Endo/Other  negative endocrine ROS  Renal/GU      Musculoskeletal   Abdominal   Peds  Hematology Eliquis, plavix   Anesthesia Other Findings   Reproductive/Obstetrics                            Anesthesia Physical Anesthesia Plan  ASA: III  Anesthesia Plan: General   Post-op Pain Management:    Induction: Intravenous  PONV Risk Score and Plan: 1 and Ondansetron and Dexamethasone  Airway Management Planned: Oral ETT  Additional Equipment: None  Intra-op Plan:   Post-operative Plan: Extubation in OR  Informed Consent: I have reviewed the patients History and Physical, chart, labs and discussed the procedure including the risks, benefits and alternatives for the proposed anesthesia with the patient or authorized representative who has indicated his/her understanding and acceptance.       Plan Discussed with: CRNA and Surgeon  Anesthesia Plan Comments:        Anesthesia Quick Evaluation

## 2020-06-16 NOTE — Anesthesia Procedure Notes (Signed)
Procedure Name: Intubation Date/Time: 06/16/2020 4:53 PM Performed by: Eligha Bridegroom, CRNA Pre-anesthesia Checklist: Patient being monitored, Suction available, Timeout performed, Emergency Drugs available and Patient identified Patient Re-evaluated:Patient Re-evaluated prior to induction Oxygen Delivery Method: Circle system utilized Induction Type: IV induction Ventilation: Mask ventilation without difficulty Laryngoscope Size: Mac and 4 Grade View: Grade I Tube type: Oral Tube size: 7.5 mm Number of attempts: 1 Airway Equipment and Method: Stylet Placement Confirmation: ETT inserted through vocal cords under direct vision,  positive ETCO2 and breath sounds checked- equal and bilateral Secured at: 22 cm Tube secured with: Tape Dental Injury: Teeth and Oropharynx as per pre-operative assessment

## 2020-06-16 NOTE — Op Note (Signed)
Date: Jun 16, 2020  Preoperative diagnosis: Occluded left common femoral to below-knee popliteal prosthetic bypass (redo bypass)  Postoperative diagnosis: Same  Procedure: 1.  Redo exposure of left below-knee popliteal artery 2.  Thrombectomy of left common femoral to below-knee popliteal prosthetic bypass 3.  Left lower extremity thrombectomy of tibial vessels including the peroneal artery 4.  Removal of below-knee popliteal artery stent (thrombosed) 5.  Bovine pericardial patch angioplasty of distal bypass onto the native below-knee popliteal artery  Surgeon: Dr. Cephus Shelling, MD  Assistant: Mosetta Pigeon, [place]  Indications: Patient is a 70 year old male who previous underwent a redo left common femoral to below-knee prosthetic bypass in 2019.  He has had multiple attempts at thrombolysis most recently last month.  He presented to clinic on Tuesday with an occluded bypass.  I discussed he is high risk for limb loss and offered him a open thrombectomy as one last attempt to salvage his bypass.  An assistant was needed for exposure and to expedite the case.  Findings: The left below-knee popliteal artery including the distal bypass graft was re-exposed.  I opened the distal graft onto the native below-knee popliteal artery.  The stent in the distal anastomosis was removed and was full of chronic thrombus.  I thrombectomized the bypass and got excellent pulsatile inflow using #4 #5 Fogarty's.  I was able to pass #2 Fogarty distally into the peroneal just above the ankle (single vessel peroneal runoff).  Subsequently performed bovine pericardial patch angioplasty of the distal graft onto the below-knee popliteal artery.  Pretty brisk PT signal at completion.  Anesthesia: General  EBL: 500 mL  Details: Patient was taken to the operating room after informed consent was obtained.  Placed on the operating table in the supine position.  General endotracheal anesthesia was induced.   Antibiotics were given.  The left leg was then prepped and draped in usual sterile fashion.  Timeout was performed.  Initially opened the previous below-knee popliteal incision dissected through scar tissue and used Meyerding retractors for added visualization.  The distal bypass graft onto the native below-knee popliteal artery was exposed.  Ultimately we did have to ligate a number of venous branches and other collaterals in order to get adequate exposure.  There was significant scar tissue.  I then placed a tourniquet on the upper leg.  Patient was given 100 units/kg IV heparin.  I then opened the distal bypass graft onto the native below-knee popliteal artery with longitudinal incision and Potts scissors.  There was no inflow.  It became evident there was a stent there and this was subsequently removed under direct visualization and was full of chronic thrombus.  I then had some backbleeding distally.  I then used #3 #4 #5 Fogarty's multiple passes and finally was able to get pulsatile inflow in the graft.  That point time I then used a baby profunda clamp for proximal control of the graft.  I then exsanguinated the leg with an Esmarch and inflated the tourniquet on the upper thigh to control back bleeding from distal vessels.  I was able to pass #2 Fogarty distally and extend my arteriotomy onto the native below-knee popliteal artery.  I then brought a bovine pericardial patch on the field and this was sewn with a 6-0 Prolene parachute technique from the distal graft over the below-knee knee popliteal artery.  He had pretty good Doppler signal in the PT at the ankle.  Protamine was given for reversal.  Surgicel snow was used  for hemostasis and incision was closed with multiple layers 3-0 Vicryl, 4-0 Monocryl, Dermabond.  Complication: None  Condition: Stable  Cephus Shelling, MD [facility] [place] Office: [phone]   Cephus Shelling

## 2020-06-16 NOTE — Progress Notes (Signed)
ANTICOAGULATION CONSULT NOTE - Initial Consult  Pharmacy Consult for IV Heparin Indication: thrombosed left fem-pop bypass  No Known Allergies  Patient Measurements: Height: 5\' 6"  (167.6 cm) Weight: 62.1 kg (137 lb) IBW/kg (Calculated) : 63.8 Heparin Dosing Weight: 62.1 kg  Vital Signs: Temp: 97.6 F (36.4 C) (05/18 2000) Temp Source: Oral (05/18 1244) BP: 111/64 (05/18 2000) Pulse Rate: 82 (05/18 2000)  Labs: Recent Labs    06/16/20 1331  HGB 13.9  HCT 45.2  PLT 256  APTT 28  LABPROT 12.8  INR 1.0  CREATININE 1.08    Estimated Creatinine Clearance: 55.1 mL/min (by C-G formula based on SCr of 1.08 mg/dL).   Medical History: Past Medical History:  Diagnosis Date  . Hypertension   . PVD (peripheral vascular disease) [facility])      Assessment: 70 year old male status post stent to left SFA for thrombosis of fem-pop bypass. Pharmacy consulted for IV Heparin to start on 5/19 at 0300 AM (no bolus).   CBC within normal limits. INR 1. Baseline aPTT 28. No anticoagulation prior to admission.   Goal of Therapy:  Heparin level 0.3-0.7 units/ml Monitor platelets by anticoagulation protocol: Yes   Plan:  Start IV Heparin (no bolus) at 03:00 AM on 5/19 at rate of 1000 units/hr. Heparin level in 8 hours.  Daily Heparin level and CBC while on therapy.   6/19, PharmD, BCPS, BCCCP Clinical Pharmacist Please refer to [facility] for [facility] numbers 06/16/2020,8:49 PM

## 2020-06-16 NOTE — OR Nursing (Signed)
Removal of previous stent in left SFA by Dr. Chestine Spore. Pt has had multiple stents, there was no way to intraoperatively chart or explant this particular stent.

## 2020-06-16 NOTE — H&P (Signed)
History and Physical Interval Note:  06/16/2020 3:35 PM  Ryan [name]  has presented today for surgery, with the diagnosis of PAD.  The various methods of treatment have been discussed with the patient and family. After consideration of risks, benefits and other options for treatment, the patient has consented to  Procedure(s): THROMBECTOMY LEFT LOWER EXTREMITY (Left) as a surgical intervention.  The patient's history has been reviewed, patient examined, no change in status, stable for surgery.  I have reviewed the patient's chart and labs.  Questions were answered to the patient's satisfaction.    Plan thrombectomy of left common femoral to below-knee popliteal artery bypass graft.  This was a re-do graft placed in 2019 by myself.  Thrombosed last month and he already underwent attempt at lysis.  He only has single vessel peroneal runoff that is small.  Discussed will be at risk for more proximal amputation if this does not work.  Cephus Shelling  [facility] [place] HISTORY AND PHYSICAL   History of Present Illness:  Patient is a 70 y.o. year old male who presents for evaluation of non healing left fifth toe.  He hashad a redo left common femoral to below-knee prosthetic bypass that has already undergone thrombolysis oncein the past. He presented to the office on Tuesday with evidence of occluded bypass. Followed by Thrombolytics catheter placed.             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  He states the toe pain wakes him up at night.  He is on ASA, Statin and Eliquis since the last intervention.  His last dose of Eliquis was this am.      Past Medical History:  Diagnosis Date  . Hypertension   . PVD (peripheral vascular disease) (HCC)     Past Surgical History:  Procedure Laterality Date  . ABDOMINAL AORTAGRAM Left 12/13/2016   ABDOMINAL AORTOGRAM W/LOWER EXTREMITY/notes  12/13/2016  . ABDOMINAL AORTOGRAM W/LOWER EXTREMITY N/A 11/28/2016   Procedure: ABDOMINAL AORTOGRAM W/LOWER EXTREMITY;  Surgeon: Yates Decamp, MD;  Location: [facility];  Service: Cardiovascular;  Laterality: N/A;  bilateral  . ABDOMINAL AORTOGRAM W/LOWER EXTREMITY N/A 12/13/2016   Procedure: ABDOMINAL AORTOGRAM W/LOWER EXTREMITY;  Surgeon: Maeola Harman, MD;  Location: [facility] INVASIVE CV LAB;  Service: Cardiovascular;  Laterality: N/A;  unilater left leg  . ABDOMINAL AORTOGRAM W/LOWER EXTREMITY N/A 01/09/2018   Procedure: ABDOMINAL AORTOGRAM W/LOWER EXTREMITY;  Surgeon: Cephus Shelling, MD;  Location: [facility];  Service: Cardiovascular;  Laterality: N/A;  . ABDOMINAL AORTOGRAM W/LOWER EXTREMITY Left 09/22/2019   Procedure: ABDOMINAL AORTOGRAM W/LOWER EXTREMITY;  Surgeon: Maeola Harman, MD;  Location: [facility] INVASIVE CV LAB;  Service: Cardiovascular;  Laterality: Left;  . FEMORAL-POPLITEAL BYPASS GRAFT Left 12/14/2016   Procedure: BYPASS GRAFT COMMON FEMORAL- BELOW KNEE POPLITEAL ARTERY with Bovine Patch to Below the knee poplitieal artery.;  Surgeon: Fransisco Hertz, MD;  Location: [facility] OR;  Service: Vascular;  Laterality: Left;  . FEMORAL-POPLITEAL BYPASS GRAFT Left 01/28/2018   Procedure: BYPASS GRAFT FEMORAL-BELOW KNEE POPLITEAL ARTERY REDO with propaten vascular graft removable ring;  Surgeon: Cephus Shelling, MD;  Location: [facility] OR;  Service: Vascular;  Laterality: Left;  . FEMUR SURGERY Right 2009   pt. has a rod in that leg  . LOWER EXTREMITY ANGIOGRAPHY Left 11/28/2016   Procedure: Lower Extremity Angiography;  Surgeon: Yates Decamp, MD;  Location:  [facility];  Service: Cardiovascular;  Laterality: Left;  lysis followup lower leg  . PATCH ANGIOPLASTY Left 01/28/2018   Procedure: PATCH ANGIOPLASTY USING Livia Snellen BIOLOGIC PATCH;  Surgeon: Cephus Shelling, MD;  Location: [facility] OR;  Service: Vascular;  Laterality: Left;  . PERIPHERAL  VASCULAR BALLOON ANGIOPLASTY  11/28/2016   Procedure: PERIPHERAL VASCULAR BALLOON ANGIOPLASTY;  Surgeon: Yates Decamp, MD;  Location: [facility];  Service: Cardiovascular;;  SFA  . PERIPHERAL VASCULAR CATHETERIZATION N/A 08/11/2014   Procedure: Lower Extremity Angiography;  Surgeon: Yates Decamp, MD;  Location: [facility] INVASIVE CV LAB;  Service: Cardiovascular;  Laterality: N/A;  . PERIPHERAL VASCULAR INTERVENTION Left 09/22/2019   Procedure: PERIPHERAL VASCULAR INTERVENTION;  Surgeon: Maeola Harman, MD;  Location: [facility] INVASIVE CV LAB;  Service: Cardiovascular;  Laterality: Left;  . PERIPHERAL VASCULAR INTERVENTION Left 09/23/2019   Procedure: PERIPHERAL VASCULAR INTERVENTION;  Surgeon: Nada Libman, MD;  Location: [facility];  Service: Cardiovascular;  Laterality: Left;  . PERIPHERAL VASCULAR INTERVENTION Left 05/13/2020   Procedure: PERIPHERAL VASCULAR INTERVENTION;  Surgeon: Cephus Shelling, MD;  Location: [facility] INVASIVE CV LAB;  Service: Cardiovascular;  Laterality: Left;  . PERIPHERAL VASCULAR THROMBECTOMY  11/28/2016   Procedure: PERIPHERAL VASCULAR THROMBECTOMY;  Surgeon: Yates Decamp, MD;  Location: [facility];  Service: Cardiovascular;;  Profunda  . PERIPHERAL VASCULAR THROMBECTOMY  11/28/2016   Procedure: PERIPHERAL VASCULAR THROMBECTOMY;  Surgeon: Yates Decamp, MD;  Location: [facility];  Service: Cardiovascular;;  . PERIPHERAL VASCULAR THROMBECTOMY N/A 05/12/2020   Procedure: PERIPHERAL VASCULAR THROMBECTOMY-Left Leg;  Surgeon: Leonie Douglas, MD;  Location: [facility];  Service: Cardiovascular;  Laterality: N/A;  . PERIPHERAL VASCULAR THROMBECTOMY N/A 05/13/2020   Procedure: PERIPHERAL VASCULAR THROMBECTOMY- LYSIS RECHECK;  Surgeon: Cephus Shelling, MD;  Location: [facility];  Service: Cardiovascular;  Laterality: N/A;  . PULMONARY THROMBECTOMY N/A 09/23/2019   Procedure: LYSIS RECHECK;  Surgeon: Nada Libman, MD;   Location: [facility];  Service: Cardiovascular;  Laterality: N/A;  . THROMBECTOMY FEMORAL ARTERY Left 12/14/2016   Procedure: THROMBECTOMY PROFUNDA FEMORAL ARTERY;  Surgeon: Fransisco Hertz, MD;  Location: [facility] OR;  Service: Vascular;  Laterality: Left;    ROS:   General:  No weight loss, Fever, chills  HEENT: No recent headaches, no nasal bleeding, no visual changes, no sore throat  Neurologic: No dizziness, blackouts, seizures. No recent symptoms of stroke or mini- stroke. No recent episodes of slurred speech, or temporary blindness.  Cardiac: No recent episodes of chest pain/pressure, no shortness of breath at rest.  No shortness of breath with exertion.  Denies history of atrial fibrillation or irregular heartbeat  Vascular: No history of rest pain in feet.  No history of claudication.  No history of non-healing ulcer, No history of DVT   Pulmonary: No home oxygen, no productive cough, no hemoptysis,  No asthma or wheezing  Musculoskeletal:  [ ]  Arthritis, [ ]  Low back pain,  [ ]  Joint pain  Hematologic:No history of hypercoagulable state.  No history of easy bleeding.  No history of anemia  Gastrointestinal: No hematochezia or melena,  No gastroesophageal reflux, no trouble swallowing  Urinary: [ ]  chronic Kidney disease, [ ]  on HD - [ ]  MWF or [ ]  TTHS, [ ]  Burning with urination, [ ]  Frequent urination, [ ]  Difficulty urinating;   Skin: No rashes  Psychological: No history of anxiety,  No history of depression  Social History Social  History        Tobacco Use  . Smoking status: Current Every Day Smoker    Packs/day: 0.75    Years: 56.00    Pack years: 42.00    Types: Cigarettes  . Smokeless tobacco: Never Used  Vaping Use  . Vaping Use: Never used  Substance Use Topics  . Alcohol use: Yes    Alcohol/week: 10.0 standard drinks    Types: 10 Shots of liquor per week    Comment:  01/29/2018 "1-2 shots per night"  . Drug use: No     Family History      Family History  Problem Relation Age of Onset  . Hypertension Mother   . Diabetes Mother   . Hypertension Father     Allergies  No Known Allergies         Current Outpatient Medications  Medication Sig Dispense Refill  . apixaban (ELIQUIS) 5 MG TABS tablet Take 1 tablet (5 mg total) by mouth 2 (two) times daily. 60 tablet 0  . aspirin EC 81 MG tablet Take 81 mg by mouth daily. Swallow whole.    . clopidogrel (PLAVIX) 75 MG tablet Take 1 tablet (75 mg total) by mouth daily. 30 tablet 11  . gabapentin (NEURONTIN) 100 MG capsule Take 1 capsule (100 mg total) by mouth at bedtime. 30 capsule 0  . ketoconazole (NIZORAL) 2 % cream Apply to toes and interspaces twice daily for dead skin/ itching 60 g 2  . losartan-hydrochlorothiazide (HYZAAR) 100-25 MG tablet Take 1 tablet by mouth daily.     Marland Kitchen oxyCODONE-acetaminophen (PERCOCET) 5-325 MG tablet Take 1 tablet by mouth every 6 (six) hours as needed for severe pain. 20 tablet 0  . rosuvastatin (CRESTOR) 10 MG tablet Take 1 tablet (10 mg total) by mouth daily. 30 tablet 3   No current facility-administered medications for this visit.    Physical Examination     Vitals:   06/15/20 1508  BP: 115/68  Pulse: 75  Resp: 20  Temp: 97.9 F (36.6 C)  TempSrc: Temporal  SpO2: 100%  Weight: 136 lb 1.6 oz (61.7 kg)  Height: 5\' 6"  (1.676 m)    Body mass index is 21.97 kg/m.  General:  Alert and oriented, no acute distress HEENT: Normal Neck: No bruit or JVD Pulmonary: Clear to auscultation bilaterally Cardiac: Regular Rate and Rhythm without murmur Abdomen: Soft, non-tender, non-distended, no mass, no scars Skin: No rash, toes/forefoot cool and painful to touch.       Musculoskeletal: No deformity or edema      Neurologic: Upper and lower extremity motor 5/5 and symmetric  DATA:  The bypass duplex shows occlusion of the bypass  Left ABI 0.30 and TBI 0  ASSESSMENT/Plan:  Left fem-pop bypass occluded within 4 weeks of bypass thrombectomy.  He has a non healing wound on the fifth toe with rest pain.  Dr. reviewed the studies and examined the patient.             He offered repeat thrombectomy 06/16/20.  He will hold his Eliquis tonight and tomorrow.        06/18/20 PA-C [facility] [place] Office: [phone]  MD in office [phone]

## 2020-06-16 NOTE — Transfer of Care (Signed)
Immediate Anesthesia Transfer of Care Note  Patient: Ryan [name]  Procedure(s) Performed: Redo exposure ot Left below knee popiteal artery (Left Leg Lower) THROMBECTOMY OF BYPASS GRAFT FEMORAL-POPLITEAL ARTERY and TIBIAL ARTERY (Left Leg Lower) PATCH ANGIOPLASTY OF LEFT BELOW KNEE POPITEAL ARTERY (Left Leg Lower)  Patient Location: PACU  Anesthesia Type:General  Level of Consciousness: awake and alert   Airway & Oxygen Therapy: Patient Spontanous Breathing  Post-op Assessment: Report given to RN and Post -op Vital signs reviewed and stable  Post vital signs: Reviewed and stable  Last Vitals:  Vitals Value Taken Time  BP 116/66 06/16/20 1919  Temp 36.4 C 06/16/20 1919  Pulse 77 06/16/20 1931  Resp 14 06/16/20 1931  SpO2 100 % 06/16/20 1931  Vitals shown include unvalidated device data.  Last Pain:  Vitals:   06/16/20 1244  TempSrc: Oral  PainSc:       Patients Stated Pain Goal: 4 (06/16/20 1220)  Complications: No complications documented.

## 2020-06-16 NOTE — Anesthesia Postprocedure Evaluation (Signed)
Anesthesia Post Note  Patient: Ryan [name]  Procedure(s) Performed: Redo exposure ot Left below knee popiteal artery (Left Leg Lower) THROMBECTOMY OF BYPASS GRAFT FEMORAL-POPLITEAL ARTERY and TIBIAL ARTERY (Left Leg Lower) PATCH ANGIOPLASTY OF LEFT BELOW KNEE POPITEAL ARTERY (Left Leg Lower)     Patient location during evaluation: PACU Anesthesia Type: General Level of consciousness: awake and alert Pain management: pain level controlled Vital Signs Assessment: post-procedure vital signs reviewed and stable Respiratory status: spontaneous breathing, nonlabored ventilation and respiratory function stable Cardiovascular status: blood pressure returned to baseline and stable Postop Assessment: no apparent nausea or vomiting Anesthetic complications: no   No complications documented.  Last Vitals:  Vitals:   06/16/20 1945 06/16/20 2000  BP: 105/65 111/64  Pulse: 74 82  Resp: 14 12  Temp:  36.4 C  SpO2: 100% 100%    Last Pain:  Vitals:   06/16/20 2030  TempSrc:   PainSc: 0-No pain                 Beryle Lathe

## 2020-06-17 ENCOUNTER — Encounter (HOSPITAL_COMMUNITY): Payer: Self-pay | Admitting: Vascular Surgery

## 2020-06-17 LAB — LIPID PANEL
Cholesterol: 80 mg/dL (ref 0–200)
HDL: 24 mg/dL — ABNORMAL LOW (ref >40)
LDL Cholesterol: 41 mg/dL (ref 0–99)
Total CHOL/HDL Ratio: 3.3 RATIO
Triglycerides: 73 mg/dL (ref <150)
VLDL: 15 mg/dL (ref 0–40)

## 2020-06-17 LAB — BASIC METABOLIC PANEL
Anion gap: 9 (ref 5–15)
BUN: 24 mg/dL — ABNORMAL HIGH (ref 8–23)
CO2: 20 mmol/L — ABNORMAL LOW (ref 22–32)
Calcium: 8.8 mg/dL — ABNORMAL LOW (ref 8.9–10.3)
Chloride: 104 mmol/L (ref 98–111)
Creatinine, Ser: 1.25 mg/dL — ABNORMAL HIGH (ref 0.61–1.24)
GFR, Estimated: >60 mL/min (ref >60)
Glucose, Bld: 198 mg/dL — ABNORMAL HIGH (ref 70–99)
Potassium: 3.8 mmol/L (ref 3.5–5.1)
Sodium: 133 mmol/L — ABNORMAL LOW (ref 135–145)

## 2020-06-17 LAB — CBC
HCT: 32.2 % — ABNORMAL LOW (ref 39.0–52.0)
Hemoglobin: 10.3 g/dL — ABNORMAL LOW (ref 13.0–17.0)
MCH: 26.9 pg (ref 26.0–34.0)
MCHC: 32 g/dL (ref 30.0–36.0)
MCV: 84.1 fL (ref 80.0–100.0)
Platelets: 189 10*3/uL (ref 150–400)
RBC: 3.83 MIL/uL — ABNORMAL LOW (ref 4.22–5.81)
RDW: 15 % (ref 11.5–15.5)
WBC: 7.6 10*3/uL (ref 4.0–10.5)
nRBC: 0 % (ref 0.0–0.2)

## 2020-06-17 MED ORDER — APIXABAN 5 MG PO TABS
5.0000 mg | ORAL_TABLET | Freq: Two times a day (BID) | ORAL | Status: DC
  Administered 2020-06-17 – 2020-06-18 (×3): 5 mg via ORAL
  Filled 2020-06-17 (×3): qty 1

## 2020-06-17 NOTE — Progress Notes (Signed)
OT in room with patient and he is slow to mobilize, but otherwise doing well. PT to work with patient. May need HHPT. Eliquis dose given. Likely home tomorrow.

## 2020-06-17 NOTE — Progress Notes (Addendum)
   VASCULAR SURGERY ASSESSMENT & PLAN:   1 Day Post-Op PAD: Patient presented with occluded left common femoral to below-knee popliteal prosthetic bypass (redo bypass). He is POD 1 thrombectomy of left common femoral to below-knee popliteal prosthetic bypass, left lower extremity thrombectomy of tibial vessels including the peroneal artery, removal of below-knee popliteal artery stent (thrombosed) and bovine pericardial patch angioplasty of distal bypass onto the native below-knee popliteal artery.  VSS. Afebrile. LLE well perfused with good Doppler signals. Discontinue heparin infusion and resume apixiban (PTA dose). Mobilize and continue to monitor for possible discharge this afternoon. Continue daily Betadine paint to left 5th toe ulcer.  He will follow-up in one month with ABIs and LLE arterial duplex.  Continue aspirin, Plavix and statin.  Acute blood loss anemia>>OOB and monitor for symptoms.   SUBJECTIVE:   Pre-op foot pain has improved and he is voiding spontaneously. No CP, SOB or nausea  PHYSICAL EXAM:   Vitals:   06/16/20 1945 06/16/20 2000 06/17/20 0008 06/17/20 0009  BP: 105/65 111/64 93/62 95/60   Pulse: 74 82 80 80  Resp: 14 12 12 16   Temp:  97.6 F (36.4 C) 97.6 F (36.4 C) 97.6 F (36.4 C)  TempSrc:   Oral   SpO2: 100% 100% 99%   Weight:      Height:       General appearance: Awake, alert in no apparent distress Cardiac: Heart rate and rhythm are regular Respirations: Nonlabored Incisions: Left lower leg incision is well approximated without bleeding or hematoma Extremities: Both feet are warm with intact sensation and motor function.  Brisk left dorsalis pedis, posterior tibial and peroneal artery Doppler signals. Left 5th toe ulcer is dry.  LABS:   Lab Results  Component Value Date   WBC 7.6 06/17/2020   HGB 10.3 (L) 06/17/2020   HCT 32.2 (L) 06/17/2020   MCV 84.1 06/17/2020   PLT 189 06/17/2020   Lab Results  Component Value Date   CREATININE  1.25 (H) 06/17/2020   Lab Results  Component Value Date   INR 1.0 06/16/2020   CBG (last 3)  No results for input(s): GLUCAP in the last 72 hours.  PROBLEM LIST:    Active Problems:   PAD (peripheral artery disease) (HCC)   CURRENT MEDS:   . aspirin EC  81 mg Oral Daily  . clopidogrel  75 mg Oral Daily  . docusate sodium  100 mg Oral Daily  . losartan  100 mg Oral Daily   And  . hydrochlorothiazide  25 mg Oral Daily  . HYDROmorphone      . pantoprazole  40 mg Oral Daily  . rosuvastatin  10 mg Oral Daily    06/18/2020, Milinda Antis Office: [phone] 06/17/2020.  I have seen and evaluated the patient. I agree with the PA note as documented above.  Postop day 1 status post thrombectomy of left leg bypass with removal of occluded popliteal stent and patch angioplasty of the distal anastomosis of his old graft.  He has brisk Doppler flow this morning and his foot feels better.  Pending his mobility today may plan for discharge this afternoon and resume Eliquis.  Also on aspirin statin.  Will arrange follow-up in 1 month with left leg arterial duplex and ABIs.  Much improved.  Betadine paint to the toe.  06/19/2020, MD [facility] [place] Office: [phone]

## 2020-06-17 NOTE — Plan of Care (Signed)
?  Problem: Clinical Measurements: ?Goal: Will remain free from infection ?Outcome: Progressing ?Goal: Diagnostic test results will improve ?Outcome: Progressing ?Goal: Respiratory complications will improve ?Outcome: Progressing ?  ?

## 2020-06-17 NOTE — Evaluation (Signed)
Physical Therapy Evaluation Patient Details Name: Ryan [name] MRN: [id number] DOB: 07/15/1950 Today's Date: 06/17/2020   History of Present Illness  Pt is a 70 year old man admitted for  thrombectomy of L fem-pop BPG and tibial artery. PMHx: PVD, HTN, fem/pop BPG's with multiple redo/thrombectomies.  Clinical Impression  Pt admitted with/for thrombectomy of previous BPG ing..  Pt currently limited functionally due to the problems listed below.  (see problems list.)  Pt will benefit from PT to maximize function and safety to be able to get home safely with available assist.     Follow Up Recommendations No PT follow up    Equipment Recommendations  None recommended by PT    Recommendations for Other Services       Precautions / Restrictions Precautions Precautions: Fall Restrictions Weight Bearing Restrictions: No      Mobility  Bed Mobility Overal bed mobility: Modified Independent             General bed mobility comments: HOB up, increased time    Transfers Overall transfer level: Needs assistance Equipment used: Rolling walker (2 wheeled) Transfers: Sit to/from [employer] Sit to Stand: Min assist;Min guard Stand pivot transfers: Min guard       General transfer comment: cues for hand placement, safety and assist to rise and steady  Ambulation/Gait Ambulation/Gait assistance: Min guard Gait Distance (Feet): 25 Feet Assistive device: Rolling walker (2 wheeled) Gait Pattern/deviations: Step-to pattern   Gait velocity interpretation: <1.31 ft/sec, indicative of household ambulator General Gait Details: cues for best sequencing to lessen pain when up ambulating.  No assist needed.  Stairs            Wheelchair Mobility    Modified Rankin (Stroke Patients Only)       Balance Overall balance assessment: Needs assistance Sitting-balance support: Feet supported;No upper extremity supported Sitting balance-Leahy Scale: Good        Standing balance-Leahy Scale: Poor Standing balance comment: can release walker with one hand in static standing.  Still reliant on AD                             Pertinent Vitals/Pain Pain Assessment: 0-10 Pain Score: 9  Faces Pain Scale: Hurts even more Pain Location: L foot and incision Pain Descriptors / Indicators: Discomfort;Grimacing;Guarding Pain Intervention(s): Monitored during session;Limited activity within patient's tolerance    Home Living Family/patient expects to be discharged to:: Private residence Living Arrangements: Spouse/significant other;Children Available Help at Discharge: Family;Available 24 hours/day Type of Home: House Home Access: Level entry     Home Layout: One level Home Equipment: Walker - 2 wheels;Cane - single point;Bedside commode;Shower seat      Prior Function Level of Independence: Independent         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, 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[employer], 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   Dominant Hand: Right    Extremity/Trunk Assessment   Upper Extremity Assessment Upper Extremity Assessment: Overall WFL for tasks assessed    Lower Extremity Assessment Lower Extremity Assessment: Generalized weakness (L LE due to pain.)       Communication   Communication: No difficulties  Cognition Arousal/Alertness: Awake/alert Behavior During Therapy: WFL for tasks assessed/performed Overall Cognitive Status: Within Functional Limits for tasks assessed  General Comments General comments (skin integrity, edema, etc.): vss    Exercises     Assessment/Plan    PT Assessment Patient needs continued PT services  PT Problem List Decreased strength;Decreased activity tolerance;Decreased mobility;Decreased knowledge of use of DME;Pain       PT Treatment Interventions DME instruction;Gait training;Functional mobility training;Therapeutic activities;Patient/family education    PT  Goals (Current goals can be found in the Care Plan section)  Acute Rehab PT Goals Patient Stated Goal: to return to PLOF PT Goal Formulation: With patient Time For Goal Achievement: 06/24/20    Frequency Min 3X/week   Barriers to discharge        Co-evaluation               AM-PAC PT "6 Clicks" Mobility  Outcome Measure Help needed turning from your back to your side while in a flat bed without using bedrails?: A Little Help needed moving from lying on your back to sitting on the side of a flat bed without using bedrails?: A Little Help needed moving to and from a bed to a chair (including a wheelchair)?: A Little Help needed standing up from a chair using your arms (e.g., wheelchair or bedside chair)?: A Little Help needed to walk in hospital room?: A Little Help needed climbing 3-5 steps with a railing? : A Little 6 Click Score: 18    End of Session   Activity Tolerance: Patient tolerated treatment well Patient left: in bed;with call bell/phone within reach Nurse Communication: Mobility status PT Visit Diagnosis: Other abnormalities of gait and mobility (R26.89);Pain Pain - Right/Left: Left Pain - part of body: Leg    Time: 8315-1761 PT Time Calculation (min) (ACUTE ONLY): 22 min   Charges:   PT Evaluation $PT Eval Low Complexity: 1 Low          06/17/2020  Jacinto Halim., PT Acute Rehabilitation Services [phone]  (pager) [phone]  (office)  Eliseo Gum Vandy Fong 06/17/2020, 2:51 PM

## 2020-06-17 NOTE — Evaluation (Signed)
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 PMHx: PVD, HTN, fem/pop BPG's with multiple redo/thrombectomies.   Clinical Impression   Pt was functioning independently prior to admission. 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 Pt presents with L LE and foot pain with poor standing balance. He requires min assist to rise to standing and pivots to chair with min guard assist and cues for safety. Pt needs set up to min assist for ADL. Pt likely to progress well and not require post acute OT. Will follow acutely.     Follow Up Recommendations  No OT follow up    Equipment Recommendations  None recommended by OT    Recommendations for Other Services       Precautions / Restrictions Precautions Precautions: Fall Restrictions Weight Bearing Restrictions: No      Mobility Bed Mobility Overal bed mobility: Modified Independent             General bed mobility comments: HOB up, increased time    Transfers Overall transfer level: Needs assistance Equipment used: Rolling walker (2 wheeled) Transfers: Sit to/from [employer] Sit to Stand: Min assist Stand pivot transfers: Min guard       General transfer comment: cues for hand placement, safety and assist to rise and steady    Balance Overall balance assessment: Needs assistance   Sitting balance-Leahy Scale: Good       Standing balance-Leahy Scale: Poor Standing balance comment: can release walker with one hand in static standing                           ADL either performed or assessed with clinical judgement   ADL Overall ADL's : Needs assistance/impaired Eating/Feeding: Independent   Grooming: Sitting;Wash/dry hands;Set up    Upper Body Bathing: Set up;Sitting   Lower Body Bathing: Minimal assistance;Sit to/from stand   Upper Body Dressing : Set up;Sitting   Lower Body Dressing: Minimal assistance;Sit to/from stand   Toilet Transfer: Min guard;Ambulation;RW   Toileting- Clothing Manipulation and Hygiene: Minimal assistance;Sit to/from stand       Functional mobility during ADLs: Min guard;Rolling walker;Cueing for safety;Cueing for sequencing General ADL Comments: pt placing little weight on L LE     Vision Baseline Vision/History: No visual deficits       Perception     Praxis      Pertinent Vitals/Pain Pain Assessment: Faces Faces Pain Scale: Hurts even more Pain Location: L foot and incision Pain Descriptors / Indicators: Discomfort;Grimacing;Guarding Pain Intervention(s): Monitored during session;Premedicated before session;Repositioned     Hand Dominance Right   Extremity/Trunk Assessment Upper Extremity Assessment Upper Extremity Assessment: Overall WFL for tasks assessed   Lower Extremity Assessment Lower Extremity Assessment: Defer to PT evaluation       Communication Communication Communication: No difficulties   Cognition Arousal/Alertness: Awake/alert Behavior During Therapy: WFL for tasks assessed/performed Overall Cognitive Status: Within Functional Limits for tasks assessed                                     General Comments       Exercises  Shoulder Instructions      Home Living Family/patient expects to be discharged to:: Private residence Living Arrangements: Spouse/significant other;Children Available Help at Discharge: Family;Available 24 hours/day Type of Home: House Home Access: Level entry     Home Layout: One level     Bathroom Shower/Tub: Tub/shower unit;Door   Bathroom Toilet: Handicapped height     Home Equipment: Environmental consultant - 2 wheels;Cane - single point;Bedside commode;Shower seat          Prior  Functioning/Environment Level of Independence: Independent        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, 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 Problem List: Impaired balance (sitting and/or standing);Decreased knowledge of use of DME or AE;Pain      OT Treatment/Interventions: Self-care/ADL training;DME and/or AE instruction;Patient/family education;Balance training    OT Goals(Current goals can be found in the care plan section) Acute Rehab OT Goals Patient Stated Goal: to return to PLOF OT Goal Formulation: With patient Time For Goal Achievement: 07/01/20 Potential to Achieve Goals: Good ADL Goals Pt Will Perform Grooming: with modified independence;standing Pt Will Perform Lower Body Bathing: with modified independence;sit to/from stand Pt Will Perform Lower Body Dressing: with modified independence;sit to/from stand Pt Will Transfer to Toilet: with modified independence;ambulating Pt Will Perform Toileting - Clothing Manipulation and hygiene: with modified independence;sit to/from stand  OT Frequency: Min 2X/week   Barriers to D/C:            Co-evaluation              AM-PAC OT "6 Clicks" Daily Activity     Outcome Measure Help from another person eating meals?: None Help from another person taking care of personal grooming?: A Little Help from another person toileting, which includes using toliet, bedpan, or urinal?: A Little Help from another person bathing (including washing, rinsing, drying)?: A Little Help from another person to put on and taking off regular upper body clothing?: None Help from another person to put on and taking off regular lower body clothing?: A Little 6 Click Score: 20   End of Session Equipment Utilized During Treatment: Rolling walker Nurse Communication: Mobility status  Activity Tolerance: Patient tolerated treatment well Patient left: in chair;with call bell/phone within reach;with nursing/sitter in room  OT Visit Diagnosis: Unsteadiness on feet  (R26.81);Other abnormalities of gait and mobility (R26.89);Pain Pain - Right/Left: Left Pain - part of body: Ankle and joints of foot;Leg                Time: 4098-1191 OT Time Calculation (min): 29 min Charges:  OT General Charges $OT Visit: 1 Visit OT Evaluation $OT Eval Moderate Complexity: 1 Mod OT Treatments $Self Care/Home Management : 8-22 mins  Martie Round, OTR/L Acute Rehabilitation Services Pager: [phone] Office: [phone]  Evern Bio 06/17/2020, 1:12 PM

## 2020-06-17 NOTE — Progress Notes (Signed)
Mobility Specialist: Progress Note   06/17/20 1716  Mobility  Activity Ambulated in room  Level of Assistance Contact guard assist, steadying assist  Assistive Device Front wheel walker  Distance Ambulated (ft) 30 ft  Mobility Ambulated with assistance in room  Mobility Response Tolerated well  Mobility performed by Mobility specialist  $Mobility charge 1 Mobility   Pre-Mobility: 82 HR, 100/54 BP, 100% SpO2 Post-Mobility: 102 HR, 126/68 BP, 100% SpO2  Pt c/o pain in LLE during ambulation, no rating given, otherwise asx. Pt back to bed after walk with call bell at his side.   [facility] [place] Piggee Mobility Specialist Mobility Specialist Phone: [phone]

## 2020-06-18 ENCOUNTER — Other Ambulatory Visit (HOSPITAL_COMMUNITY): Payer: Self-pay

## 2020-06-18 ENCOUNTER — Other Ambulatory Visit: Payer: Self-pay | Admitting: *Deleted

## 2020-06-18 DIAGNOSIS — I70229 Atherosclerosis of native arteries of extremities with rest pain, unspecified extremity: Secondary | ICD-10-CM

## 2020-06-18 DIAGNOSIS — I739 Peripheral vascular disease, unspecified: Secondary | ICD-10-CM

## 2020-06-18 LAB — CBC
HCT: 27.5 % — ABNORMAL LOW (ref 39.0–52.0)
Hemoglobin: 8.6 g/dL — ABNORMAL LOW (ref 13.0–17.0)
MCH: 26.7 pg (ref 26.0–34.0)
MCHC: 31.3 g/dL (ref 30.0–36.0)
MCV: 85.4 fL (ref 80.0–100.0)
Platelets: 185 10*3/uL (ref 150–400)
RBC: 3.22 MIL/uL — ABNORMAL LOW (ref 4.22–5.81)
RDW: 14.8 % (ref 11.5–15.5)
WBC: 9.2 10*3/uL (ref 4.0–10.5)
nRBC: 0 % (ref 0.0–0.2)

## 2020-06-18 LAB — BASIC METABOLIC PANEL
Anion gap: 5 (ref 5–15)
BUN: 32 mg/dL — ABNORMAL HIGH (ref 8–23)
CO2: 25 mmol/L (ref 22–32)
Calcium: 8.3 mg/dL — ABNORMAL LOW (ref 8.9–10.3)
Chloride: 102 mmol/L (ref 98–111)
Creatinine, Ser: 1.29 mg/dL — ABNORMAL HIGH (ref 0.61–1.24)
GFR, Estimated: 59 mL/min — ABNORMAL LOW (ref >60)
Glucose, Bld: 145 mg/dL — ABNORMAL HIGH (ref 70–99)
Potassium: 3.6 mmol/L (ref 3.5–5.1)
Sodium: 132 mmol/L — ABNORMAL LOW (ref 135–145)

## 2020-06-18 MED ORDER — OXYCODONE-ACETAMINOPHEN 5-325 MG PO TABS
1.0000 | ORAL_TABLET | Freq: Four times a day (QID) | ORAL | 0 refills | Status: DC | PRN
  Filled 2020-06-18: qty 20, 5d supply, fill #0

## 2020-06-18 NOTE — Progress Notes (Signed)
Discharge instructions provided to patient. Follow-up appointments, incision care and medications reviewed. All questions answered. IV removed. Patient to be escorted home by his wife.  Allegra Grana RN

## 2020-06-18 NOTE — Discharge Instructions (Signed)
[facility]  Discharge instructions  Lower Extremity Bypass Surgery  Please refer to the following instruction for your post-procedure care. Your surgeon or physician assistant will discuss any changes with you.  Activity  You are encouraged to walk as much as you can. You can slowly return to normal activities during the month after your surgery. Avoid strenuous activity and heavy lifting until your doctor tells you it's OK. Avoid activities such as vacuuming or swinging a golf club. Do not drive until your doctor give the OK and you are no longer taking prescription pain medications. It is also normal to have difficulty with sleep habits, eating and bowel movement after surgery. These will go away with time.  Bathing/Showering  You may shower after you go home. Do not soak in a bathtub, hot tub, or swim until the incision heals completely.  Incision Care  Clean your incision with mild soap and water. Shower every day. Pat the area dry with a clean towel. You do not need a bandage unless otherwise instructed. Do not apply any ointments or creams to your incision. If you have open wounds you will be instructed how to care for them or a visiting nurse may be arranged for you. If you have staples or sutures along your incision they will be removed at your post-op appointment. You may have skin glue on your incision. Do not peel it off. It will come off on its own in about one week. If you have a great deal of moisture in your groin, use a gauze help keep this area dry.  Diet  Resume your normal diet. There are no special food restrictions following this procedure. A low fat/ low cholesterol diet is recommended for all patients with vascular disease. In order to heal from your surgery, it is CRITICAL to get adequate nutrition. Your body requires vitamins, minerals, and protein. Vegetables are the best source of vitamins and minerals. Vegetables also provide the  perfect balance of protein. Processed food has little nutritional value, so try to avoid this.  Medications  Resume taking all your medications unless your doctor or nurse practitioner tells you not to. If your incision is causing pain, you may take over-the-counter pain relievers such as acetaminophen (Tylenol). If you were prescribed a stronger pain medication, please aware these medication can cause nausea and constipation. Prevent nausea by taking the medication with a snack or meal. Avoid constipation by drinking plenty of fluids and eating foods with high amount of fiber, such as fruits, vegetables, and grains. Take Colase 100 mg (an over-the-counter stool softener) twice a day as needed for constipation. Do not take Tylenol if you are taking prescription pain medications.  Follow Up  Our office will schedule a follow up appointment 2-3 weeks following discharge.  Please call us immediately for any of the following conditions  .Severe or worsening pain in your legs or feet while at rest or while walking .Increase pain, redness, warmth, or drainage (pus) from your incision site(s) Fever of 101 degree or higher The swelling in your leg with the bypass suddenly worsens and becomes more painful than when you were in the hospital If you have been instructed to feel your graft pulse then you should do so every day. If you can no longer feel this pulse, call the office immediately. Not all patients are given this instruction.  Leg swelling is common after leg bypass surgery.  The swelling should improve over a few months  following surgery. To improve the swelling, you may elevate your legs above the level of your heart while you are sitting or resting. Your surgeon or physician assistant may ask you to apply an ACE wrap or wear compression (TED) stockings to help to reduce swelling.  Reduce your risk of vascular disease  Stop smoking. If you would like help call [facility] at 1-800-QUIT-NOW  (906-806-0128) or [place] at [phone].  Manage your cholesterol Maintain a desired weight Control your diabetes weight Control your diabetes Keep your blood pressure down  If you have any questions, please call the office at [phone]  ==================================================================================================  Information on my medicine - ELIQUIS (apixaban)  Why was Eliquis prescribed for you? 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  What do You need to know about Eliquis ? Your current dose dose is ONE 5 mg tablet taken TWICE daily.  Eliquis may be taken with or without food.   Try to take the dose about the same time in the morning and in the evening. If you have difficulty swallowing the tablet whole please discuss with your pharmacist how to take the medication safely.  Take Eliquis exactly as prescribed and DO NOT stop taking Eliquis without talking to the doctor who prescribed the medication.  Stopping may increase your risk of developing a new blood clot.  Refill your prescription before you run out.  After discharge, you should have regular check-up appointments with your healthcare provider that is prescribing your Eliquis.    What do you do if you miss a dose? If a dose of ELIQUIS is not taken at the scheduled time, take it as soon as possible on the same day and twice-daily administration should be resumed. The dose should not be doubled to make up for a missed dose.  Important Safety Information A possible side effect of Eliquis is bleeding. You should call your healthcare provider right away if you experience any of the following: ? Bleeding from an injury or your nose that does not stop. ? Unusual colored urine (red or dark brown) or unusual colored stools (red or black). ? Unusual  bruising for unknown reasons. ? A serious fall or if you hit your head (even if there is no bleeding).  Some medicines may interact with Eliquis and might increase your risk of bleeding or clotting while on Eliquis. To help avoid this, consult your healthcare provider or pharmacist prior to using any new prescription or non-prescription medications, including herbals, vitamins, non-steroidal anti-inflammatory drugs (NSAIDs) and supplements.  This website has more information on Eliquis (apixaban): http://www.eliquis.com/eliquis/home

## 2020-06-18 NOTE — Progress Notes (Signed)
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 PMHx: PVD, HTN, fem/pop BPG's with multiple redo/thrombectomies.    PT Comments    Pt received in supine, agreeable to therapy session and with good participation and tolerance for household distance gait trial. Pt mainly limited due to LLE foot/thigh/incisional pain with gait progression and continues to perform mostly TWB with heavy reliance on RW, but able to progress distance. Pt tachycardic with increased distance to 122 bpm but reports he feels asymptomatic. Heavy emphasis on importance of supine HEP and quad sets/ankle pumps for improved hemodynamics and to prevent L knee contracture. Pt L 5th toe also appears dark with deep appearing wound medially between toes. Pt continues to benefit from PT services to progress toward functional mobility goals.  Anticipate pt safe to DC with PRN supervision/assist from family once medically cleared.   Follow Up Recommendations  No PT follow up (HEP given)     Equipment Recommendations  None recommended by PT    Recommendations for Other Services       Precautions / Restrictions Precautions Precautions: Fall Restrictions Weight Bearing Restrictions: No    Mobility  Bed Mobility Overal bed mobility: Modified Independent             General bed mobility comments: HOB flat, no rails    Transfers Overall transfer level: Needs assistance Equipment used: Rolling walker (2 wheeled) Transfers: Sit to/from Stand Sit to Stand: Min guard         General transfer comment: cues for hand placement/technique to reduce pain  Ambulation/Gait Ambulation/Gait assistance: Supervision Gait Distance (Feet): 75 Feet Assistive device: Rolling walker (2 wheeled) Gait Pattern/deviations: Step-to  pattern;Antalgic;Decreased weight shift to left (absent dorsiflexion on LLE)   Gait velocity interpretation: <1.8 ft/sec, indicate of risk for recurrent falls General Gait Details: heavy UE support required, RW lowered for improved shoulder comfort, pt mostly TWB due to LLE pain   Stairs Stairs:  (verbal discussion for technique and handout given)           Wheelchair Mobility    Modified Rankin (Stroke Patients Only)       Balance Overall balance assessment: Needs assistance Sitting-balance support: Feet supported;No upper extremity supported Sitting balance-Leahy Scale: Good     Standing balance support: Single extremity supported;Bilateral upper extremity supported Standing balance-Leahy Scale: Fair Standing balance comment: can release walker with one hand in static standing.  Still reliant on AD due to LLE pain                            Cognition Arousal/Alertness: Awake/alert Behavior During Therapy: WFL for tasks assessed/performed Overall Cognitive Status: Within Functional Limits for tasks assessed                                 General Comments: pleasantly participatory      Exercises General Exercises - Lower Extremity Ankle Circles/Pumps: AROM;Both;10 reps;Supine;Limitations (limited ROM on LLE due to foot/toe pain) Ankle Circles/Pumps Limitations: limited ROM 2/2 pain Quad Sets: AROM;Left;15 reps;Supine (encouraged frequent performance at least TID due to preference for L knee flexion at rest) Hip ABduction/ADduction: AROM;Left;10 reps;Supine    General Comments General comments (skin integrity, edema, etc.): HR tachy  to 122 bpm during exertion (longer gait trial), HR 88 bpm resting and SpO2 98% on RA; RR reading 25-45 with exertion however may be inaccurate as pt compulsively chewing during mobility (tobacco? RN notified)      Pertinent Vitals/Pain Pain Assessment: 0-10 Pain Score: 8  Pain Location: L foot and incision,  moderate resting and up to 9/10 with WB Pain Descriptors / Indicators: Discomfort;Grimacing;Guarding Pain Intervention(s): Monitored during session;Premedicated before session;Repositioned (premedicated ~1hr prior to session)     PT Goals (current goals can now be found in the care plan section) Acute Rehab PT Goals Patient Stated Goal: to return to PLOF PT Goal Formulation: With patient Time For Goal Achievement: 06/24/20 Progress towards PT goals: Progressing toward goals    Frequency    Min 3X/week      PT Plan Current plan remains appropriate    AM-PAC PT "6 Clicks" Mobility   Outcome Measure  Help needed turning from your back to your side while in a flat bed without using bedrails?: None Help needed moving from lying on your back to sitting on the side of a flat bed without using bedrails?: None Help needed moving to and from a bed to a chair (including a wheelchair)?: A Little Help needed standing up from a chair using your arms (e.g., wheelchair or bedside chair)?: A Little Help needed to walk in hospital room?: A Little Help needed climbing 3-5 steps with a railing? : A Lot 6 Click Score: 19    End of Session Equipment Utilized During Treatment: Gait belt Activity Tolerance: Patient tolerated treatment well;Other (comment) (tachy, RN aware) Patient left: in bed;with call bell/phone within reach;with bed alarm set;Other (comment) (LLE slightly elevated on yellow bone foam for comfort) Nurse Communication: Mobility status;Other (comment) (tachy/high pain score with LLE WB, L 5th toe wound between 4th and 5th toe, may be chewing tobacco?) PT Visit Diagnosis: Other abnormalities of gait and mobility (R26.89);Pain Pain - Right/Left: Left Pain - part of body: Leg     Time: 8295-6213 PT Time Calculation (min) (ACUTE ONLY): 26 min  Charges:  $Gait Training: 8-22 mins $Therapeutic Exercise: 8-22 mins                     Ryan [name] P., PTA Acute Rehabilitation  Services Pager: [phone] Office: [phone]    [place] 06/18/2020, 10:40 AM

## 2020-06-18 NOTE — Progress Notes (Signed)
Pt continues to have increased L foot pain and tends to weight bear on L toes. Heavy reliance on RW for ambulation, but only requiring supervision for safety. Pt is able to reach his feet for bathing and dressing.    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 PMHx: PVD, HTN, fem/pop BPG's with multiple redo/thrombectomies.  Precautions  Precautions Fall  Pain Assessment  Pain Assessment Faces  Faces Pain Scale 8  Pain Location L foot and incision, moderate resting and up to 9/10 with WB  Pain Descriptors / Indicators Discomfort;Grimacing;Guarding  Pain Intervention(s) Monitored during session;Repositioned  Cognition  Arousal/Alertness Awake/alert  Behavior During Therapy WFL for tasks assessed/performed  Overall Cognitive Status Within Functional Limits for tasks assessed  ADL  Overall ADL's  Needs assistance/impaired  Grooming Supervision/safety;Standing  Lower Body Dressing Supervision/safety;Sit to/from Lawyer;Ambulation;Regular Advertising account planner;Sit to/from stand  Functional mobility during ADLs Supervision/safety;Rolling walker  General ADL Comments pt placing weight on toes on L LE  Bed Mobility  Overal bed mobility Modified Independent  General bed mobility comments HOB flat, no rails  Balance  Overall balance assessment Needs assistance  Sitting balance-Leahy Scale Good  Standing balance-Leahy Scale Fair  Standing balance comment leans on sink  Transfers  Overall transfer level Needs assistance  Equipment used Rolling walker (2 wheeled)  Transfers Sit to/from Stand  Sit to Stand Supervision  General transfer comment for safety  OT - End of Session  Equipment Utilized During Treatment Gait belt;Rolling walker  Activity  Tolerance Patient tolerated treatment well  Patient left in bed;with call bell/phone within reach;with nursing/sitter in room  OT Assessment/Plan  OT Plan Discharge plan remains appropriate  OT Visit Diagnosis Unsteadiness on feet (R26.81);Other abnormalities of gait and mobility (R26.89);Pain  Pain - Right/Left Left  Pain - part of body Ankle and joints of foot;Leg  OT Frequency (ACUTE ONLY) Min 2X/week  Follow Up Recommendations No OT follow up  OT Equipment None recommended by OT  AM-PAC OT "6 Clicks" Daily Activity Outcome Measure (Version 2)  Help from another person eating meals? 4  Help from another person taking care of personal grooming? 3  Help from another person toileting, which includes using toliet, bedpan, or urinal? 3  Help from another person bathing (including washing, rinsing, drying)? 3  Help from another person to put on and taking off regular upper body clothing? 4  Help from another person to put on and taking off regular lower body clothing? 3  6 Click Score 20  OT Goal Progression  Progress towards OT goals Progressing toward goals  Acute Rehab OT Goals  Patient Stated Goal to return to PLOF  OT Goal Formulation With patient  Time For Goal Achievement 07/01/20  Potential to Achieve Goals Good  OT Time Calculation  OT Start Time (ACUTE ONLY) 1023  OT Stop Time (ACUTE ONLY) 1033  OT Time Calculation (min) 10 min  OT General Charges  $OT Visit 1 Visit  OT Treatments  $Self Care/Home Management  8-22 mins  Martie Round, OTR/L Acute Rehabilitation Services Pager: [phone] Office: [phone]

## 2020-06-18 NOTE — Progress Notes (Addendum)
  Progress Note    06/18/2020 7:36 AM 2 Days Post-Op   Subjective:  L foot without rest pain   Vitals:   06/18/20 0038 06/18/20 0336  BP: (!) 95/59 104/69  Pulse: 81 85  Resp: 16 17  Temp: 98.5 F (36.9 C) 98 F (36.7 C)  SpO2: 100% 99%   Physical Exam: Lungs:  Non labored Incisions:  L pop incision c/d/i Extremities:  Brisk L PT and AT by doppler;  Neurologic: A&O  CBC    Component Value Date/Time   WBC 9.2 06/18/2020 0140   RBC 3.22 (L) 06/18/2020 0140   HGB 8.6 (L) 06/18/2020 0140   HCT 27.5 (L) 06/18/2020 0140   PLT 185 06/18/2020 0140   MCV 85.4 06/18/2020 0140   MCH 26.7 06/18/2020 0140   MCHC 31.3 06/18/2020 0140   RDW 14.8 06/18/2020 0140    BMET    Component Value Date/Time   NA 132 (L) 06/18/2020 0140   K 3.6 06/18/2020 0140   CL 102 06/18/2020 0140   CO2 25 06/18/2020 0140   GLUCOSE 145 (H) 06/18/2020 0140   BUN 32 (H) 06/18/2020 0140   CREATININE 1.29 (H) 06/18/2020 0140   CALCIUM 8.3 (L) 06/18/2020 0140   GFRNONAA 59 (L) 06/18/2020 0140   GFRAA >60 09/23/2019 0455    INR    Component Value Date/Time   INR 1.0 06/16/2020 1331     Intake/Output Summary (Last 24 hours) at 06/18/2020 0736 Last data filed at 06/18/2020 7989 Gross per 24 hour  Intake 480 ml  Output 2025 ml  Net -1545 ml     Assessment/Plan:  70 y.o. male is s/p LLE thrombectomy with patch angioplasty of BK popliteal artery 2 Days Post-Op   L foot warm and well perfused with brisk ATA and PTA doppler signals ABL anemia: Hgb drifting but asymptomatic; no obvious sign of blood loss currently OOB this morning D/c home this afternoon if pain controlled and mobility improved   Emilie Rutter, PA-C [facility] [phone] 06/18/2020 7:36 AM  I have seen and evaluated the patient. I agree with the PA note as documented above.  Postop day 2 status post thrombectomy of left common femoral to below-knee prosthetic bypass with removal popliteal stent  and patch angioplasty of the distal anastomosis with thrombectomy of the tibials.  He has brisk Doppler flow this morning in the DP/PT.  Calf is nice and soft.  Plan discharge today and will arrange follow-up in 1 month with ABIs and left leg arterial duplex.  We will continue aspirin, Plavix, Eliquis.  Cephus Shelling, MD [facility] [place] Office: [phone]

## 2020-06-20 ENCOUNTER — Other Ambulatory Visit: Payer: Self-pay | Admitting: Vascular Surgery

## 2020-06-20 NOTE — Discharge Summary (Signed)
  Discharge Summary  Patient ID: Ryan [name] [id number] 69 y.o. 20-Oct-1950  Admit date: 06/16/2020  Discharge date and time: 06/18/2020 12:55 PM   Admitting Physician: Cephus Shelling, MD   Discharge Physician: same  Admission Diagnoses: PAD (peripheral artery disease) [facility]) [I73.9]  Discharge Diagnoses: same  Admission Condition: fair  Discharged Condition: fair  Indication for Admission: ischemic left lower extremity  Hospital Course: [name] is a 70 year old male who presented to the office with an occluded left lower extremity bypass graft and ischemic left leg on 06/15/2020.  He went to the operating room on 06/16/2020 with Dr. Chestine Spore and underwent thrombectomy of left lower extremity with revision of distal anastomosis of bypass with bovine patch angioplasty.  He tolerated the procedure well and was admitted to the hospital postoperatively.  POD #1 patient had good Doppler signals in his foot however remained hospitalized due to ongoing need for IV pain medication.  POD #2 his mobility improved and pain was better controlled.  At the time of discharge she had brisk ATA and PTA Doppler signals in his left foot.  He will continue his aspirin, Plavix, and Eliquis.  He will follow-up with Dr. Chestine Spore in about 1 month with left lower extremity arterial duplex and ABIs.  He was prescribed 2 to 3 days of narcotic pain medication for continued postoperative pain control.  He was discharged home in stable condition.  Consults: None  Treatments: surgery: Thrombectomy of left lower extremity with revision of distal anastomosis of bypass with bovine patch angioplasty by Dr. Chestine Spore on 06/16/2020  Discharge Exam: See progress note 06/18/20 Vitals:   06/18/20 0803 06/18/20 1114  BP: (!) 90/53 (!) 99/53  Pulse: 65 90  Resp: 15 17  Temp: 97.9 F (36.6 C) 98.4 F (36.9 C)  SpO2: 100% 100%     Disposition: Discharge disposition: 01-Home or Self Care       Patient  Instructions:  Allergies as of 06/18/2020   No Known Allergies     Medication List    TAKE these medications   aspirin EC 81 MG tablet Take 81 mg by mouth daily. Swallow whole.   clopidogrel 75 MG tablet Commonly known as: Plavix Take 1 tablet (75 mg total) by mouth daily.   gabapentin 100 MG capsule Commonly known as: NEURONTIN Take 1 capsule (100 mg total) by mouth at bedtime.   ketoconazole 2 % cream Commonly known as: NIZORAL Apply to toes and interspaces twice daily for dead skin/ itching   losartan-hydrochlorothiazide 100-25 MG tablet Commonly known as: HYZAAR Take 1 tablet by mouth daily.   oxyCODONE-acetaminophen 5-325 MG tablet Commonly known as: Percocet Take 1 tablet by mouth every 6 (six) hours as needed for severe pain.   rosuvastatin 20 MG tablet Commonly known as: CRESTOR Take 20 mg by mouth at bedtime. What changed: Another medication with the same name was removed. Continue taking this medication, and follow the directions you see here.      Activity: activity as tolerated Diet: regular diet Wound Care: keep wound clean and dry  Follow-up with Dr. Chestine Spore in 2 weeks.  Signed: Emilie Rutter, PA-C 06/20/2020 8:12 AM [facility] Office: [phone]

## 2020-06-24 ENCOUNTER — Other Ambulatory Visit: Payer: Self-pay | Admitting: Podiatrist

## 2020-06-25 ENCOUNTER — Other Ambulatory Visit: Payer: Self-pay | Admitting: Vascular Surgery

## 2020-06-29 ENCOUNTER — Other Ambulatory Visit: Payer: Self-pay | Admitting: Vascular Surgery

## 2020-06-29 ENCOUNTER — Telehealth: Payer: Self-pay

## 2020-06-29 MED ORDER — OXYCODONE-ACETAMINOPHEN 5-325 MG PO TABS: 1.0000 | ORAL_TABLET | Freq: Four times a day (QID) | ORAL | 0 refills | Status: DC | PRN

## 2020-06-29 NOTE — Telephone Encounter (Signed)
Patient called to report soreness in his left leg s/p redo fem pop bypass on 5/18. He is having some pain in his foot when he puts weight on it, says it's unchanged. Incisions "look good and I'm proud of them". Leg is swollen, not discolored or cold. Discussed with [facility], calling in pain med rx to [facility] on file.

## 2020-06-30 ENCOUNTER — Other Ambulatory Visit: Payer: Self-pay

## 2020-06-30 ENCOUNTER — Ambulatory Visit (INDEPENDENT_AMBULATORY_CARE_PROVIDER_SITE_OTHER): Payer: Medicare Other | Admitting: Podiatry

## 2020-06-30 DIAGNOSIS — L97521 Non-pressure chronic ulcer of other part of left foot limited to breakdown of skin: Secondary | ICD-10-CM

## 2020-06-30 DIAGNOSIS — I739 Peripheral vascular disease, unspecified: Secondary | ICD-10-CM

## 2020-07-06 ENCOUNTER — Encounter: Payer: Self-pay | Admitting: Podiatry

## 2020-07-06 NOTE — Progress Notes (Signed)
Subjective:  Patient ID: Ryan [name], male    DOB: 1950-07-06,  MRN: [id number]  Chief Complaint  Patient presents with  . Numbness    Numbness in toes  PT stated that he has a place on his 5th toe     70 y.o. male presents for wound care.  Patient presents with complaint of left fifth digit superficial ulceration in between the fifth and fourth interdigital space.  Patient states is doing better.  He underwent angiogram with intervention given that he had terrible flow to the left lower extremity.  He states the flow is doing much better now.  He has been doing Betadine wet-to-dry dressing changes.  Review of Systems: Negative except as noted in the HPI. Denies N/V/F/Ch.  Past Medical History:  Diagnosis Date  . Hypertension   . PVD (peripheral vascular disease) (HCC)     Current Outpatient Medications:  .  aspirin EC 81 MG tablet, Take 81 mg by mouth daily. Swallow whole., Disp: , Rfl:  .  clopidogrel (PLAVIX) 75 MG tablet, Take 1 tablet (75 mg total) by mouth daily., Disp: 30 tablet, Rfl: 11 .  ELIQUIS 5 MG TABS tablet, TAKE 1 TABLET BY MOUTH TWICE A DAY, Disp: 60 tablet, Rfl: 0 .  gabapentin (NEURONTIN) 100 MG capsule, TAKE 1 CAPSULE BY MOUTH AT BEDTIME., Disp: 30 capsule, Rfl: 0 .  ketoconazole (NIZORAL) 2 % cream, Apply to toes and interspaces twice daily for dead skin/ itching, Disp: 60 g, Rfl: 2 .  losartan-hydrochlorothiazide (HYZAAR) 100-25 MG tablet, Take 1 tablet by mouth daily. , Disp: , Rfl:  .  oxyCODONE-acetaminophen (PERCOCET) 5-325 MG tablet, Take 1 tablet by mouth every 6 (six) hours as needed for severe pain., Disp: 20 tablet, Rfl: 0 .  rosuvastatin (CRESTOR) 20 MG tablet, Take 20 mg by mouth at bedtime., Disp: , Rfl:   Social History   Tobacco Use  Smoking Status Current Every Day Smoker  . Packs/day: 0.75  . Years: 56.00  . Pack years: 42.00  . Types: Cigarettes  Smokeless Tobacco Never Used    No Known Allergies Objective:  There were no vitals  filed for this visit. There is no height or weight on file to calculate BMI. Constitutional Well developed. Well nourished.  Vascular Dorsalis pedis pulses non palpable bilaterally. Posterior tibial pulses non palpable bilaterally. Capillary refill norma diminished to all digits.  No cyanosis or clubbing noted. Pedal hair growth not seen  Neurologic Normal speech. Oriented to person, place, and time. Protective sensation absent  Dermatologic Wound Location: Left fifth digit limited to the breakdown of the skin.  No clinical signs of infection noted. Wound Base: Mixed Granular/Fibrotic Peri-wound: Normal Exudate: Scant/small amount Serosanguinous exudate Wound Measurements: -See below  Orthopedic: No pain to palpation either foot.   Radiographs: None Assessment:   1. PAD (peripheral artery disease) (HCC)   2. Chronic ulcer of toe, left, limited to breakdown of skin [facility])    Plan:  Patient was evaluated and treated and all questions answered.  Ulcer left fifth digit superficial ulceration limited to the breakdown of the skin with underlying claudication pain secondary to peripheral vascular disease -Debridement as below. -Dressed with Betadine wet-to-dry, DSD. -Continue off-loading with surgical shoe. -He had severe peripheral vascular disease as shown by ABIs PVRs.  He has had undergone angioplasty with intervention therefore increasing the flow to the left lower extremity.  The wound has improved sinceIntervention -Patient is a high risk of losing the digit versus amputation of the  leg.  I discussed this with the patient.  He states understanding  Procedure: Excisional Debridement of Wound Tool: Sharp chisel blade/tissue nipper Rationale: Removal of non-viable soft tissue from the wound to promote healing.  Anesthesia: none Pre-Debridement Wound Measurements: 0.7 cm x 0.5 cm x 0.2 cm Post-Debridement Wound Measurements: 0.9 cm x 0.6 cm x 0.2 cm Type of Debridement: Sharp  Excisional Tissue Removed: Non-viable soft tissue Blood loss: Minimal (<50cc) Depth of Debridement: subcutaneous tissue. Technique: Sharp excisional debridement to bleeding, viable wound base.  Wound Progress: The wound is decreasing and improving Dressing: Dry, sterile, compression dressing. Disposition: Patient tolerated procedure well. Patient to return in 1 week for follow-up.  No follow-ups on file.

## 2020-07-20 ENCOUNTER — Ambulatory Visit (INDEPENDENT_AMBULATORY_CARE_PROVIDER_SITE_OTHER): Payer: Medicare Other | Admitting: Vascular Surgery

## 2020-07-20 ENCOUNTER — Encounter: Payer: Self-pay | Admitting: Vascular Surgery

## 2020-07-20 ENCOUNTER — Other Ambulatory Visit: Payer: Self-pay

## 2020-07-20 ENCOUNTER — Ambulatory Visit (INDEPENDENT_AMBULATORY_CARE_PROVIDER_SITE_OTHER)
Admit: 2020-07-20 | Discharge: 2020-07-20 | Disposition: A | Discharge status: Home / Self Care | Payer: Medicare Other | Attending: Vascular Surgery | Admitting: Vascular Surgery

## 2020-07-20 ENCOUNTER — Ambulatory Visit (HOSPITAL_COMMUNITY)
Admission: RE | Admit: 2020-07-20 | Discharge: 2020-07-20 | Disposition: A | Discharge status: Home / Self Care | Payer: Medicare Other | Source: Ambulatory Visit | Attending: Vascular Surgery | Admitting: Vascular Surgery

## 2020-07-20 VITALS — BP 122/66 | HR 52 | Temp 98.1°F | Resp 16 | Ht 66.0 in | Wt 142.0 lb

## 2020-07-20 DIAGNOSIS — I739 Peripheral vascular disease, unspecified: Secondary | ICD-10-CM | POA: Diagnosis not present

## 2020-07-20 DIAGNOSIS — I70229 Atherosclerosis of native arteries of extremities with rest pain, unspecified extremity: Secondary | ICD-10-CM

## 2020-07-20 NOTE — Progress Notes (Signed)
Patient name: Ryan [name] MRN: [id number] DOB: [birth year] Sex: male  REASON FOR VISIT: Postop check after thrombectomy of left leg bypass  HPI: Ryan [name] is a 70 y.o. male with history of hypertension and tobacco abuse as well as peripheral vascular disease that presents for post-op check.  He initially had a left common femoral to below-knee pop bypass with propaten by Dr. Imogene Burn in 2018.  This subsequently occluded in 2019 and I performed a redo left common femoral to below-knee popliteal bypass with 6 mm ringed PTFE.  The redo bypass in 2019 lasted until about August 2021 when he presented with critical limb ischemia with an occluded bypass and underwent thrombolysis of the occluded left leg bypass.  This was treated with a proximal 7 x 40 Eluvia and a distal 5 x 60 Tigris on 09/23/2019.  Bypass subsequently occluded again after previous lysis and I most recently performed thrombectomy of the bypass as well as tibial thrombectomy and removal of the stent across the distal anastomosis with a bovine patch on 06/16/2020.  He states his foot feels so much better.  He had a wound on the fifth toe that is healed.  Remains on Eliquis, Plavix, and Aspirin.    Past Medical History:  Diagnosis Date   Hypertension    PVD (peripheral vascular disease) [facility])     Past Surgical History:  Procedure Laterality Date   ABDOMINAL AORTAGRAM Left 12/13/2016   ABDOMINAL AORTOGRAM W/LOWER EXTREMITY/notes 12/13/2016   ABDOMINAL AORTOGRAM W/LOWER EXTREMITY N/A 11/28/2016   Procedure: ABDOMINAL AORTOGRAM W/LOWER EXTREMITY;  Surgeon: Yates Decamp, MD;  Location: [facility] INVASIVE CV LAB;  Service: Cardiovascular;  Laterality: N/A;  bilateral   ABDOMINAL AORTOGRAM W/LOWER EXTREMITY N/A 12/13/2016   Procedure: ABDOMINAL AORTOGRAM W/LOWER EXTREMITY;  Surgeon: Maeola Harman, MD;  Location: [facility] INVASIVE CV LAB;  Service: Cardiovascular;  Laterality: N/A;  unilater left leg   ABDOMINAL AORTOGRAM W/LOWER EXTREMITY N/A  01/09/2018   Procedure: ABDOMINAL AORTOGRAM W/LOWER EXTREMITY;  Surgeon: Cephus Shelling, MD;  Location: [facility] INVASIVE CV LAB;  Service: Cardiovascular;  Laterality: N/A;   ABDOMINAL AORTOGRAM W/LOWER EXTREMITY Left 09/22/2019   Procedure: ABDOMINAL AORTOGRAM W/LOWER EXTREMITY;  Surgeon: Maeola Harman, MD;  Location: [facility] INVASIVE CV LAB;  Service: Cardiovascular;  Laterality: Left;   FEMORAL-POPLITEAL BYPASS GRAFT Left 12/14/2016   Procedure: BYPASS GRAFT COMMON FEMORAL- BELOW KNEE POPLITEAL ARTERY with Bovine Patch to Below the knee poplitieal artery.;  Surgeon: Fransisco Hertz, MD;  Location: [facility] OR;  Service: Vascular;  Laterality: Left;   FEMORAL-POPLITEAL BYPASS GRAFT Left 01/28/2018   Procedure: BYPASS GRAFT FEMORAL-BELOW KNEE POPLITEAL ARTERY REDO with propaten vascular graft removable ring;  Surgeon: Cephus Shelling, MD;  Location: [facility] OR;  Service: Vascular;  Laterality: Left;   FEMUR SURGERY Right 2009   pt. has a rod in that leg   LOWER EXTREMITY ANGIOGRAPHY Left 11/28/2016   Procedure: Lower Extremity Angiography;  Surgeon: Yates Decamp, MD;  Location: [facility] INVASIVE CV LAB;  Service: Cardiovascular;  Laterality: Left;  lysis followup lower leg   PATCH ANGIOPLASTY Left 01/28/2018   Procedure: PATCH ANGIOPLASTY USING Livia Snellen BIOLOGIC PATCH;  Surgeon: Cephus Shelling, MD;  Location: [facility] OR;  Service: Vascular;  Laterality: Left;   PATCH ANGIOPLASTY Left 06/16/2020   Procedure: PATCH ANGIOPLASTY OF LEFT BELOW KNEE POPITEAL ARTERY;  Surgeon: Cephus Shelling, MD;  Location: [facility] OR;  Service: Vascular;  Laterality: Left;   PERIPHERAL VASCULAR BALLOON ANGIOPLASTY  11/28/2016   Procedure: PERIPHERAL VASCULAR BALLOON  ANGIOPLASTY;  Surgeon: Yates Decamp, MD;  Location: [facility] INVASIVE CV LAB;  Service: Cardiovascular;;  SFA   PERIPHERAL VASCULAR CATHETERIZATION N/A 08/11/2014   Procedure: Lower Extremity Angiography;  Surgeon: Yates Decamp, MD;  Location: [facility] INVASIVE CV LAB;  Service:  Cardiovascular;  Laterality: N/A;   PERIPHERAL VASCULAR INTERVENTION Left 09/22/2019   Procedure: PERIPHERAL VASCULAR INTERVENTION;  Surgeon: Maeola Harman, MD;  Location: [facility] INVASIVE CV LAB;  Service: Cardiovascular;  Laterality: Left;   PERIPHERAL VASCULAR INTERVENTION Left 09/23/2019   Procedure: PERIPHERAL VASCULAR INTERVENTION;  Surgeon: Nada Libman, MD;  Location: [facility] INVASIVE CV LAB;  Service: Cardiovascular;  Laterality: Left;   PERIPHERAL VASCULAR INTERVENTION Left 05/13/2020   Procedure: PERIPHERAL VASCULAR INTERVENTION;  Surgeon: Cephus Shelling, MD;  Location: [facility] INVASIVE CV LAB;  Service: Cardiovascular;  Laterality: Left;   PERIPHERAL VASCULAR THROMBECTOMY  11/28/2016   Procedure: PERIPHERAL VASCULAR THROMBECTOMY;  Surgeon: Yates Decamp, MD;  Location: [facility] INVASIVE CV LAB;  Service: Cardiovascular;;  Profunda   PERIPHERAL VASCULAR THROMBECTOMY  11/28/2016   Procedure: PERIPHERAL VASCULAR THROMBECTOMY;  Surgeon: Yates Decamp, MD;  Location: [facility] INVASIVE CV LAB;  Service: Cardiovascular;;   PERIPHERAL VASCULAR THROMBECTOMY N/A 05/12/2020   Procedure: PERIPHERAL VASCULAR THROMBECTOMY-Left Leg;  Surgeon: Leonie Douglas, MD;  Location: [facility] INVASIVE CV LAB;  Service: Cardiovascular;  Laterality: N/A;   PERIPHERAL VASCULAR THROMBECTOMY N/A 05/13/2020   Procedure: PERIPHERAL VASCULAR THROMBECTOMY- LYSIS RECHECK;  Surgeon: Cephus Shelling, MD;  Location: [facility] INVASIVE CV LAB;  Service: Cardiovascular;  Laterality: N/A;   PULMONARY THROMBECTOMY N/A 09/23/2019   Procedure: LYSIS RECHECK;  Surgeon: Nada Libman, MD;  Location: [facility] INVASIVE CV LAB;  Service: Cardiovascular;  Laterality: N/A;   THROMBECTOMY FEMORAL ARTERY Left 12/14/2016   Procedure: THROMBECTOMY PROFUNDA FEMORAL ARTERY;  Surgeon: Fransisco Hertz, MD;  Location: [facility] OR;  Service: Vascular;  Laterality: Left;   THROMBECTOMY FEMORAL ARTERY Left 06/16/2020   Procedure: Redo exposure ot Left below knee popiteal artery;   Surgeon: Cephus Shelling, MD;  Location: [facility] OR;  Service: Vascular;  Laterality: Left;   THROMBECTOMY OF BYPASS GRAFT FEMORAL- POPLITEAL ARTERY Left 06/16/2020   Procedure: THROMBECTOMY OF BYPASS GRAFT FEMORAL-POPLITEAL ARTERY and TIBIAL ARTERY;  Surgeon: Cephus Shelling, MD;  Location: [facility] OR;  Service: Vascular;  Laterality: Left;    Family History  Problem Relation Age of Onset   Hypertension Mother    Diabetes Mother    Hypertension Father     SOCIAL HISTORY: Social History   Tobacco Use   Smoking status: Every Day    Packs/day: 0.75    Years: 56.00    Pack years: 42.00    Types: Cigarettes   Smokeless tobacco: Never  Substance Use Topics   Alcohol use: Yes    Alcohol/week: 10.0 standard drinks    Types: 10 Shots of liquor per week    Comment:  01/29/2018 "1-2 shots per night"    No Known Allergies  Current Outpatient Medications  Medication Sig Dispense Refill   aspirin EC 81 MG tablet Take 81 mg by mouth daily. Swallow whole.     clopidogrel (PLAVIX) 75 MG tablet Take 1 tablet (75 mg total) by mouth daily. 30 tablet 11   gabapentin (NEURONTIN) 100 MG capsule TAKE 1 CAPSULE BY MOUTH AT BEDTIME. 30 capsule 0   ketoconazole (NIZORAL) 2 % cream Apply to toes and interspaces twice daily for dead skin/ itching 60 g 2   losartan-hydrochlorothiazide (HYZAAR) 100-25 MG tablet Take 1 tablet by  mouth daily.      rosuvastatin (CRESTOR) 20 MG tablet Take 20 mg by mouth at bedtime.     ELIQUIS 5 MG TABS tablet TAKE 1 TABLET BY MOUTH TWICE A DAY (Patient not taking: Reported on 07/20/2020) 60 tablet 0   oxyCODONE-acetaminophen (PERCOCET) 5-325 MG tablet Take 1 tablet by mouth every 6 (six) hours as needed for severe pain. (Patient not taking: Reported on 07/20/2020) 20 tablet 0   No current facility-administered medications for this visit.    REVIEW OF SYSTEMS:  [X]  denotes positive finding, [ ]  denotes negative finding Cardiac  Comments:  Chest pain or chest pressure:     Shortness of breath upon exertion:    Short of breath when lying flat:    Irregular heart rhythm:        Vascular    Pain in calf, thigh, or hip brought on by ambulation:    Pain in feet at night that wakes you up from your sleep:     Blood clot in your veins:    Leg swelling:         Pulmonary    Oxygen at home:    Productive cough:     Wheezing:         Neurologic    Sudden weakness in arms or legs:     Sudden numbness in arms or legs:     Sudden onset of difficulty speaking or slurred speech:    Temporary loss of vision in one eye:     Problems with dizziness:         Gastrointestinal    Blood in stool:     Vomited blood:         Genitourinary    Burning when urinating:     Blood in urine:        Psychiatric    Major depression:         Hematologic    Bleeding problems:    Problems with blood clotting too easily:        Skin    Rashes or ulcers:        Constitutional    Fever or chills:      PHYSICAL EXAM: Vitals:   07/20/20 1333  BP: 122/66  Pulse: (!) 52  Resp: 16  Temp: 98.1 F (36.7 C)  TempSrc: Temporal  SpO2: 98%  Weight: 142 lb (64.4 kg)  Height: 5\' 6"  (1.676 m)    GENERAL: The patient is a well-nourished male, in no acute distress. The vital signs are documented above. CARDIAC: There is a regular rate and rhythm.  VASCULAR:  Left femoral pulse palpable Left peroneal triphasic by doppler Left below-knee popliteal incision healed  DATA:   Left leg arterial duplex today shows widely patent left leg bypass after recent thrombectomy.  ABI 0.97 on the left triphasic.  Assessment/Plan:  70 year old male presents for postop check after recent thrombectomy of his redo left common femoral to below-knee popliteal bypass with removal of the distal stent across anastomosis during previous lysis and now bovine patch.  Fortunately on duplex today his bypass is widely patent with no evidence of stenosis.  He has a normal ABI of 0.97 on the left  triphasic waveform at the ankle.  His symptoms have completely resolved.  He seems very satisfied.  I will plan to keep him on Eliquis and Plavix and discussed he can stop the aspirin for my standpoint.  This is a high risk bypass at this point now  that it has undergone multiple previous lytics interventions and now open thrombectomy.  Discussed the importance of smoking cessation.  Will see him in 6 months with left leg arterial duplex and ABI.   Cephus Shellinghristopher J. Jamelyn Bovard, MD [facility] [place] Office: [phone]  Cephus Shellinghristopher J Dorr Perrot

## 2020-07-21 ENCOUNTER — Encounter (HOSPITAL_COMMUNITY): Payer: Medicare Other

## 2020-07-21 ENCOUNTER — Other Ambulatory Visit: Payer: Self-pay

## 2020-07-21 DIAGNOSIS — I70229 Atherosclerosis of native arteries of extremities with rest pain, unspecified extremity: Secondary | ICD-10-CM

## 2020-07-28 ENCOUNTER — Ambulatory Visit: Payer: Medicare Other | Admitting: Podiatry

## 2020-08-11 ENCOUNTER — Ambulatory Visit (INDEPENDENT_AMBULATORY_CARE_PROVIDER_SITE_OTHER): Payer: Medicare Other | Admitting: Podiatry

## 2020-08-11 ENCOUNTER — Other Ambulatory Visit: Payer: Self-pay

## 2020-08-11 DIAGNOSIS — I739 Peripheral vascular disease, unspecified: Secondary | ICD-10-CM | POA: Diagnosis not present

## 2020-08-11 DIAGNOSIS — L97521 Non-pressure chronic ulcer of other part of left foot limited to breakdown of skin: Secondary | ICD-10-CM

## 2020-08-12 ENCOUNTER — Encounter: Payer: Self-pay | Admitting: Podiatry

## 2020-08-12 NOTE — Progress Notes (Signed)
  Subjective:  Patient ID: Ryan [name], male    DOB: 23-Oct-1950,  MRN: [id number]  Chief Complaint  Patient presents with   Numbness    PT stated that he is doing wonderful he has no pain just some numbness    70 y.o. male presents for wound care.  P patient presents with a follow-up to left fifth digit superficial ulceration.  Patient states is doing a lot better.  He denies any other acute complaints.  Review of Systems: Negative except as noted in the HPI. Denies N/V/F/Ch.  Past Medical History:  Diagnosis Date   Hypertension    PVD (peripheral vascular disease) (HCC)     Current Outpatient Medications:    aspirin EC 81 MG tablet, Take 81 mg by mouth daily. Swallow whole., Disp: , Rfl:    clopidogrel (PLAVIX) 75 MG tablet, Take 1 tablet (75 mg total) by mouth daily., Disp: 30 tablet, Rfl: 11   ELIQUIS 5 MG TABS tablet, TAKE 1 TABLET BY MOUTH TWICE A DAY (Patient not taking: Reported on 07/20/2020), Disp: 60 tablet, Rfl: 0   gabapentin (NEURONTIN) 100 MG capsule, TAKE 1 CAPSULE BY MOUTH AT BEDTIME., Disp: 30 capsule, Rfl: 0   ketoconazole (NIZORAL) 2 % cream, Apply to toes and interspaces twice daily for dead skin/ itching, Disp: 60 g, Rfl: 2   losartan-hydrochlorothiazide (HYZAAR) 100-25 MG tablet, Take 1 tablet by mouth daily. , Disp: , Rfl:    oxyCODONE-acetaminophen (PERCOCET) 5-325 MG tablet, Take 1 tablet by mouth every 6 (six) hours as needed for severe pain. (Patient not taking: Reported on 07/20/2020), Disp: 20 tablet, Rfl: 0   rosuvastatin (CRESTOR) 20 MG tablet, Take 20 mg by mouth at bedtime., Disp: , Rfl:   Social History   Tobacco Use  Smoking Status Every Day   Packs/day: 0.75   Years: 56.00   Pack years: 42.00   Types: Cigarettes  Smokeless Tobacco Never    No Known Allergies Objective:  There were no vitals filed for this visit. There is no height or weight on file to calculate BMI. Constitutional Well developed. Well nourished.  Vascular Dorsalis pedis  pulses non palpable bilaterally. Posterior tibial pulses non palpable bilaterally. Capillary refill norma diminished to all digits.  No cyanosis or clubbing noted. Pedal hair growth not seen  Neurologic Normal speech. Oriented to person, place, and time. Protective sensation absent  Dermatologic Left fifth digit completely we epithelialized.  No further ulceration noted.  Some numbness noted.  No other acute foot deformities noted  Orthopedic: No pain to palpation either foot.   Radiographs: None Assessment:   1. PAD (peripheral artery disease) (HCC)   2. Chronic ulcer of toe, left, limited to breakdown of skin [facility])     Plan:  Patient was evaluated and treated and all questions answered.  Ulcer left fifth digit superficial ulceration limited to the breakdown of the skin with underlying claudication pain secondary to peripheral vascular disease -Clinically healed.  At this time no further concern for ulceration noted.  I encouraged him to make shoe gear modification and take the pressure off of the fifth digit to prevent future ulceration.  He can transition to regular shoes.  If any foot and ankle issues arise or if there is a real ulceration to come back and see me.  He states understanding No follow-ups on file.

## 2020-09-11 ENCOUNTER — Other Ambulatory Visit: Payer: Self-pay | Admitting: Vascular Surgery

## 2020-09-13 ENCOUNTER — Other Ambulatory Visit: Payer: Self-pay

## 2020-09-13 ENCOUNTER — Other Ambulatory Visit (HOSPITAL_COMMUNITY): Payer: Self-pay

## 2020-09-13 MED ORDER — APIXABAN 5 MG PO TABS: 5.0000 mg | ORAL_TABLET | Freq: Two times a day (BID) | ORAL | 3 refills | Status: DC

## 2020-09-13 MED ORDER — APIXABAN 5 MG PO TABS
5.0000 mg | ORAL_TABLET | Freq: Two times a day (BID) | ORAL | 11 refills | Status: DC
  Filled 2020-09-13: qty 60, 30d supply, fill #0

## 2020-11-19 ENCOUNTER — Other Ambulatory Visit: Payer: Self-pay | Admitting: Physician Assistant

## 2020-11-19 DIAGNOSIS — I779 Disorder of arteries and arterioles, unspecified: Secondary | ICD-10-CM

## 2020-11-26 ENCOUNTER — Telehealth: Payer: Self-pay

## 2020-11-26 NOTE — Telephone Encounter (Signed)
Patient's wife calls today to report that yesterday patient started to develop pain in his left ankle and foot. This morning it has started to hurt in his pinky toe. He has had extensive interventions/procedures on that leg. Denies ulcers. Moved up patient appt with [facility], duplex and ABIs

## 2020-11-30 ENCOUNTER — Other Ambulatory Visit: Payer: Self-pay

## 2020-11-30 ENCOUNTER — Encounter: Payer: Self-pay | Admitting: Vascular Surgery

## 2020-11-30 ENCOUNTER — Ambulatory Visit (INDEPENDENT_AMBULATORY_CARE_PROVIDER_SITE_OTHER): Payer: Medicare Other | Admitting: Vascular Surgery

## 2020-11-30 ENCOUNTER — Ambulatory Visit (INDEPENDENT_AMBULATORY_CARE_PROVIDER_SITE_OTHER)
Admission: RE | Admit: 2020-11-30 | Discharge: 2020-11-30 | Disposition: A | Discharge status: Home / Self Care | Payer: Medicare Other | Source: Ambulatory Visit | Attending: Vascular Surgery | Admitting: Vascular Surgery

## 2020-11-30 VITALS — BP 123/75 | HR 63 | Temp 98.3°F | Wt 137.0 lb

## 2020-11-30 DIAGNOSIS — I70229 Atherosclerosis of native arteries of extremities with rest pain, unspecified extremity: Secondary | ICD-10-CM

## 2020-11-30 MED ORDER — HYDROCODONE-ACETAMINOPHEN 5-325 MG PO TABS: 1.0000 | ORAL_TABLET | Freq: Four times a day (QID) | ORAL | 0 refills | Status: DC | PRN

## 2020-11-30 NOTE — Addendum Note (Signed)
Addended by: Primitivo Gauze on: 11/30/2020 12:08 PM   Modules accepted: Orders

## 2020-11-30 NOTE — Progress Notes (Signed)
Patient name: Ryan [name] MRN: [id number] DOB: 1950/07/03 Sex: male  REASON FOR VISIT: Evaluate new left foot rest pain  HPI: Ryan [name] is a 70 y.o. male with history of hypertension and tobacco abuse as well as peripheral vascular disease that presents for evaluation of new left leg rest pain.  He initially had a left common femoral to below-knee pop bypass with propaten by Dr. Imogene Burn in 2018.  This subsequently occluded in 2019 and I performed a redo left common femoral to below-knee popliteal bypass with 6 mm ringed PTFE.  The redo bypass in 2019 lasted until about August 2021 when he presented with critical limb ischemia with an occluded bypass and underwent thrombolysis of the occluded left leg bypass.  This was treated with a proximal 7 x 40 Eluvia and a distal 5 x 60 Tigris on 09/23/2019.  Bypass subsequently occluded again in 2022 and another lysis and then most recently performed thrombectomy of the bypass as well as tibial thrombectomy and removal of the stent across the distal anastomosis with a bovine patch on 06/16/2020.    He started having rest pain in his left foot on Thursday.  He denies any profound motor or sensory deficits other than just pain particularly in the foot itself.  Still smoking about a pack pack and a half a day.  He is taking his Eliquis.   Past Medical History:  Diagnosis Date   Hypertension    PVD (peripheral vascular disease) [facility])     Past Surgical History:  Procedure Laterality Date   ABDOMINAL AORTAGRAM Left 12/13/2016   ABDOMINAL AORTOGRAM W/LOWER EXTREMITY/notes 12/13/2016   ABDOMINAL AORTOGRAM W/LOWER EXTREMITY N/A 11/28/2016   Procedure: ABDOMINAL AORTOGRAM W/LOWER EXTREMITY;  Surgeon: Yates Decamp, MD;  Location: [facility] INVASIVE CV LAB;  Service: Cardiovascular;  Laterality: N/A;  bilateral   ABDOMINAL AORTOGRAM W/LOWER EXTREMITY N/A 12/13/2016   Procedure: ABDOMINAL AORTOGRAM W/LOWER EXTREMITY;  Surgeon: Maeola Harman, MD;  [facility]  INVASIVE CV LAB;  Service: Cardiovascular;  Laterality: N/A;  unilater left leg   ABDOMINAL AORTOGRAM W/LOWER EXTREMITY N/A 01/09/2018   Procedure: ABDOMINAL AORTOGRAM W/LOWER EXTREMITY;  Surgeon: Cephus Shelling, MD;  Location: [facility] INVASIVE CV LAB;  Service: Cardiovascular;  Laterality: N/A;   ABDOMINAL AORTOGRAM W/LOWER EXTREMITY Left 09/22/2019   Procedure: ABDOMINAL AORTOGRAM W/LOWER EXTREMITY;  Surgeon: Maeola Harman, MD;  Location: [facility] INVASIVE CV LAB;  Service: Cardiovascular;  Laterality: Left;   FEMORAL-POPLITEAL BYPASS GRAFT Left 12/14/2016   Procedure: BYPASS GRAFT COMMON FEMORAL- BELOW KNEE POPLITEAL ARTERY with Bovine Patch to Below the knee poplitieal artery.;  Surgeon: Fransisco Hertz, MD;  Location: [facility] OR;  Service: Vascular;  Laterality: Left;   FEMORAL-POPLITEAL BYPASS GRAFT Left 01/28/2018   Procedure: BYPASS GRAFT FEMORAL-BELOW KNEE POPLITEAL ARTERY REDO with propaten vascular graft removable ring;  Surgeon: Cephus Shelling, MD;  Location: [facility] OR;  Service: Vascular;  Laterality: Left;   FEMUR SURGERY Right 2009   pt. has a rod in that leg   LOWER EXTREMITY ANGIOGRAPHY Left 11/28/2016   Procedure: Lower Extremity Angiography;  Surgeon: Yates Decamp, MD;  Location: [facility] INVASIVE CV LAB;  Service: Cardiovascular;  Laterality: Left;  lysis followup lower leg   PATCH ANGIOPLASTY Left 01/28/2018   Procedure: PATCH ANGIOPLASTY USING Livia Snellen BIOLOGIC PATCH;  Surgeon: Cephus Shelling, MD;  Location: [facility] OR;  Service: Vascular;  Laterality: Left;   PATCH ANGIOPLASTY Left 06/16/2020   Procedure: PATCH ANGIOPLASTY OF LEFT BELOW KNEE POPITEAL ARTERY;  Surgeon: Sherald Hess  J, MD;  Location: [facility] OR;  Service: Vascular;  Laterality: Left;   PERIPHERAL VASCULAR BALLOON ANGIOPLASTY  11/28/2016   Procedure: PERIPHERAL VASCULAR BALLOON ANGIOPLASTY;  Surgeon: Yates Decamp, MD;  Location: [facility] INVASIVE CV LAB;  Service: Cardiovascular;;  SFA   PERIPHERAL VASCULAR CATHETERIZATION  N/A 08/11/2014   Procedure: Lower Extremity Angiography;  Surgeon: Yates Decamp, MD;  Location: [facility] INVASIVE CV LAB;  Service: Cardiovascular;  Laterality: N/A;   PERIPHERAL VASCULAR INTERVENTION Left 09/22/2019   Procedure: PERIPHERAL VASCULAR INTERVENTION;  Surgeon: Maeola Harman, MD;  Location: [facility] INVASIVE CV LAB;  Service: Cardiovascular;  Laterality: Left;   PERIPHERAL VASCULAR INTERVENTION Left 09/23/2019   Procedure: PERIPHERAL VASCULAR INTERVENTION;  Surgeon: Nada Libman, MD;  Location: [facility] INVASIVE CV LAB;  Service: Cardiovascular;  Laterality: Left;   PERIPHERAL VASCULAR INTERVENTION Left 05/13/2020   Procedure: PERIPHERAL VASCULAR INTERVENTION;  Surgeon: Cephus Shelling, MD;  Location: [facility] INVASIVE CV LAB;  Service: Cardiovascular;  Laterality: Left;   PERIPHERAL VASCULAR THROMBECTOMY  11/28/2016   Procedure: PERIPHERAL VASCULAR THROMBECTOMY;  Surgeon: Yates Decamp, MD;  Location: [facility] INVASIVE CV LAB;  Service: Cardiovascular;;  Profunda   PERIPHERAL VASCULAR THROMBECTOMY  11/28/2016   Procedure: PERIPHERAL VASCULAR THROMBECTOMY;  Surgeon: Yates Decamp, MD;  Location: [facility] INVASIVE CV LAB;  Service: Cardiovascular;;   PERIPHERAL VASCULAR THROMBECTOMY N/A 05/12/2020   Procedure: PERIPHERAL VASCULAR THROMBECTOMY-Left Leg;  Surgeon: Leonie Douglas, MD;  Location: [facility] INVASIVE CV LAB;  Service: Cardiovascular;  Laterality: N/A;   PERIPHERAL VASCULAR THROMBECTOMY N/A 05/13/2020   Procedure: PERIPHERAL VASCULAR THROMBECTOMY- LYSIS RECHECK;  Surgeon: Cephus Shelling, MD;  Location: [facility] INVASIVE CV LAB;  Service: Cardiovascular;  Laterality: N/A;   PULMONARY THROMBECTOMY N/A 09/23/2019   Procedure: LYSIS RECHECK;  Surgeon: Nada Libman, MD;  Location: [facility] INVASIVE CV LAB;  Service: Cardiovascular;  Laterality: N/A;   THROMBECTOMY FEMORAL ARTERY Left 12/14/2016   Procedure: THROMBECTOMY PROFUNDA FEMORAL ARTERY;  Surgeon: Fransisco Hertz, MD;  Location: [facility] OR;  Service: Vascular;   Laterality: Left;   THROMBECTOMY FEMORAL ARTERY Left 06/16/2020   Procedure: Redo exposure ot Left below knee popiteal artery;  Surgeon: Cephus Shelling, MD;  Location: [facility] OR;  Service: Vascular;  Laterality: Left;   THROMBECTOMY OF BYPASS GRAFT FEMORAL- POPLITEAL ARTERY Left 06/16/2020   Procedure: THROMBECTOMY OF BYPASS GRAFT FEMORAL-POPLITEAL ARTERY and TIBIAL ARTERY;  Surgeon: Cephus Shelling, MD;  Location: [facility] OR;  Service: Vascular;  Laterality: Left;    Family History  Problem Relation Age of Onset   Hypertension Mother    Diabetes Mother    Hypertension Father     SOCIAL HISTORY: Social History   Tobacco Use   Smoking status: Every Day    Packs/day: 0.75    Years: 56.00    Pack years: 42.00    Types: Cigarettes   Smokeless tobacco: Never  Substance Use Topics   Alcohol use: Yes    Alcohol/week: 10.0 standard drinks    Types: 10 Shots of liquor per week    Comment:  01/29/2018 "1-2 shots per night"    No Known Allergies  Current Outpatient Medications  Medication Sig Dispense Refill   apixaban (ELIQUIS) 5 MG TABS tablet Take 1 tablet (5 mg total) by mouth 2 (two) times daily. 60 tablet 11   apixaban (ELIQUIS) 5 MG TABS tablet Take 1 tablet (5 mg total) by mouth 2 (two) times daily. 60 tablet 3   clopidogrel (PLAVIX) 75 MG tablet TAKE 1 TABLET BY MOUTH EVERY  DAY 90 tablet 3   ELIQUIS 5 MG TABS tablet TAKE 1 TABLET BY MOUTH TWICE A DAY 60 tablet 0   gabapentin (NEURONTIN) 100 MG capsule TAKE 1 CAPSULE BY MOUTH AT BEDTIME. 30 capsule 0   ketoconazole (NIZORAL) 2 % cream Apply to toes and interspaces twice daily for dead skin/ itching 60 g 2   losartan-hydrochlorothiazide (HYZAAR) 100-25 MG tablet Take 1 tablet by mouth daily.      oxyCODONE-acetaminophen (PERCOCET) 5-325 MG tablet Take 1 tablet by mouth every 6 (six) hours as needed for severe pain. 20 tablet 0   rosuvastatin (CRESTOR) 20 MG tablet Take 20 mg by mouth at bedtime.     aspirin EC 81 MG tablet  Take 81 mg by mouth daily. Swallow whole. (Patient not taking: Reported on 11/30/2020)     No current facility-administered medications for this visit.    REVIEW OF SYSTEMS:  [X]  denotes positive finding, [ ]  denotes negative finding Cardiac  Comments:  Chest pain or chest pressure:    Shortness of breath upon exertion:    Short of breath when lying flat:    Irregular heart rhythm:        Vascular    Pain in calf, thigh, or hip brought on by ambulation:    Pain in feet at night that wakes you up from your sleep:     Blood clot in your veins:    Leg swelling:         Pulmonary    Oxygen at home:    Productive cough:     Wheezing:         Neurologic    Sudden weakness in arms or legs:     Sudden numbness in arms or legs:     Sudden onset of difficulty speaking or slurred speech:    Temporary loss of vision in one eye:     Problems with dizziness:         Gastrointestinal    Blood in stool:     Vomited blood:         Genitourinary    Burning when urinating:     Blood in urine:        Psychiatric    Major depression:         Hematologic    Bleeding problems:    Problems with blood clotting too easily:        Skin    Rashes or ulcers:        Constitutional    Fever or chills:      PHYSICAL EXAM: Vitals:   11/30/20 0935  BP: 123/75  Pulse: 63  Temp: 98.3 F (36.8 C)  TempSrc: Skin  SpO2: 95%  Weight: 137 lb (62.1 kg)    GENERAL: The patient is a well-nourished male, in no acute distress. The vital signs are documented above. CARDIAC: There is a regular rate and rhythm.  VASCULAR:  Palpable femoral pulses bilaterally Left groin and leg incision previously well-healed Left foot is grossly motor intact Dependent rubor of left foot and cool to touch  DATA:   Lower extremity arterial duplex shows left leg bypass is occluded.  ABI on the left is 0.22 dampened monophasic  Assessment/Plan:  70 year old male presents with occluded redo left common  femoral to below-knee popliteal bypass.  This underwent lysis earlier this year and on 06/16/2020 I performed thrombectomy of the bypass with thrombectomy of the tibial vessels and removal of the below-knee popliteal stent and a  bovine patch of the distal anastomosis.  I discussed with him he only has single-vessel peroneal runoff and may have limited options but we will attempt thrombolysis again for bypass and limb salvage.  We will post for Thursday in the Cath Lab with me with a recheck on Friday.  He is still smoking and is at very high risk for limb loss.  Risk benefits discussed including high risk for bleeding.  Cephus Shelling, MD [facility] Office: [phone]  Cephus Shelling

## 2020-12-02 ENCOUNTER — Inpatient Hospital Stay (HOSPITAL_COMMUNITY)
Admission: RE | Admit: 2020-12-02 | Discharge: 2020-12-05 | DRG: 272 | Disposition: A | Discharge status: Home Health | Payer: Medicare Other | Source: Ambulatory Visit | Attending: Vascular Surgery | Admitting: Vascular Surgery

## 2020-12-02 ENCOUNTER — Encounter (HOSPITAL_COMMUNITY): Payer: Self-pay | Admitting: Vascular Surgery

## 2020-12-02 ENCOUNTER — Other Ambulatory Visit: Payer: Self-pay

## 2020-12-02 ENCOUNTER — Encounter (HOSPITAL_COMMUNITY)
Admission: RE | Disposition: A | Discharge status: Home Health | Payer: Self-pay | Source: Ambulatory Visit | Attending: Vascular Surgery

## 2020-12-02 DIAGNOSIS — Y832 Surgical operation with anastomosis, bypass or graft as the cause of abnormal reaction of the patient, or of later complication, without mention of misadventure at the time of the procedure: Secondary | ICD-10-CM | POA: Diagnosis present

## 2020-12-02 DIAGNOSIS — Z20822 Contact with and (suspected) exposure to covid-19: Secondary | ICD-10-CM | POA: Diagnosis present

## 2020-12-02 DIAGNOSIS — E876 Hypokalemia: Secondary | ICD-10-CM | POA: Diagnosis not present

## 2020-12-02 DIAGNOSIS — T82868A Thrombosis of vascular prosthetic devices, implants and grafts, initial encounter: Secondary | ICD-10-CM | POA: Diagnosis present

## 2020-12-02 DIAGNOSIS — Z7982 Long term (current) use of aspirin: Secondary | ICD-10-CM | POA: Diagnosis not present

## 2020-12-02 DIAGNOSIS — T82856A Stenosis of peripheral vascular stent, initial encounter: Secondary | ICD-10-CM | POA: Diagnosis not present

## 2020-12-02 DIAGNOSIS — Z79899 Other long term (current) drug therapy: Secondary | ICD-10-CM | POA: Diagnosis not present

## 2020-12-02 DIAGNOSIS — I739 Peripheral vascular disease, unspecified: Secondary | ICD-10-CM | POA: Diagnosis present

## 2020-12-02 DIAGNOSIS — I743 Embolism and thrombosis of arteries of the lower extremities: Secondary | ICD-10-CM | POA: Diagnosis not present

## 2020-12-02 DIAGNOSIS — Z7902 Long term (current) use of antithrombotics/antiplatelets: Secondary | ICD-10-CM | POA: Diagnosis not present

## 2020-12-02 DIAGNOSIS — F17210 Nicotine dependence, cigarettes, uncomplicated: Secondary | ICD-10-CM | POA: Diagnosis present

## 2020-12-02 DIAGNOSIS — Z7901 Long term (current) use of anticoagulants: Secondary | ICD-10-CM | POA: Diagnosis not present

## 2020-12-02 DIAGNOSIS — I10 Essential (primary) hypertension: Secondary | ICD-10-CM | POA: Diagnosis present

## 2020-12-02 DIAGNOSIS — Z8249 Family history of ischemic heart disease and other diseases of the circulatory system: Secondary | ICD-10-CM | POA: Diagnosis not present

## 2020-12-02 DIAGNOSIS — I723 Aneurysm of iliac artery: Secondary | ICD-10-CM | POA: Diagnosis present

## 2020-12-02 DIAGNOSIS — I70202 Unspecified atherosclerosis of native arteries of extremities, left leg: Secondary | ICD-10-CM | POA: Diagnosis present

## 2020-12-02 HISTORY — PX: ABDOMINAL AORTOGRAM W/LOWER EXTREMITY: CATH118223

## 2020-12-02 HISTORY — PX: PERIPHERAL VASCULAR THROMBECTOMY: CATH118306

## 2020-12-02 HISTORY — PX: PERIPHERAL VASCULAR INTERVENTION: CATH118257

## 2020-12-02 LAB — CBC
HCT: 37.8 % — ABNORMAL LOW (ref 39.0–52.0)
HCT: 34.6 % — ABNORMAL LOW (ref 39.0–52.0)
Hemoglobin: 11.6 g/dL — ABNORMAL LOW (ref 13.0–17.0)
Hemoglobin: 11 g/dL — ABNORMAL LOW (ref 13.0–17.0)
MCH: 25.3 pg — ABNORMAL LOW (ref 26.0–34.0)
MCH: 26.1 pg (ref 26.0–34.0)
MCHC: 30.7 g/dL (ref 30.0–36.0)
MCHC: 31.8 g/dL (ref 30.0–36.0)
MCV: 82.5 fL (ref 80.0–100.0)
MCV: 82.2 fL (ref 80.0–100.0)
Platelets: 575 10*3/uL — ABNORMAL HIGH (ref 150–400)
Platelets: 444 10*3/uL — ABNORMAL HIGH (ref 150–400)
RBC: 4.58 MIL/uL (ref 4.22–5.81)
RBC: 4.21 MIL/uL — ABNORMAL LOW (ref 4.22–5.81)
RDW: 15.4 % (ref 11.5–15.5)
RDW: 15.3 % (ref 11.5–15.5)
WBC: 8.6 10*3/uL (ref 4.0–10.5)
WBC: 12.5 10*3/uL — ABNORMAL HIGH (ref 4.0–10.5)
nRBC: 0 % (ref 0.0–0.2)
nRBC: 0 % (ref 0.0–0.2)

## 2020-12-02 LAB — FIBRINOGEN
Fibrinogen: 721 mg/dL — ABNORMAL HIGH (ref 210–475)
Fibrinogen: 298 mg/dL (ref 210–475)

## 2020-12-02 LAB — APTT
aPTT: 134 seconds — ABNORMAL HIGH (ref 24–36)
aPTT: 100 seconds — ABNORMAL HIGH (ref 24–36)

## 2020-12-02 LAB — POCT I-STAT, CHEM 8
BUN: 54 mg/dL — ABNORMAL HIGH (ref 8–23)
Calcium, Ion: 1.25 mmol/L (ref 1.15–1.40)
Chloride: 107 mmol/L (ref 98–111)
Creatinine, Ser: 1.4 mg/dL — ABNORMAL HIGH (ref 0.61–1.24)
Glucose, Bld: 156 mg/dL — ABNORMAL HIGH (ref 70–99)
HCT: 44 % (ref 39.0–52.0)
Hemoglobin: 15 g/dL (ref 13.0–17.0)
Potassium: 3.6 mmol/L (ref 3.5–5.1)
Sodium: 143 mmol/L (ref 135–145)
TCO2: 26 mmol/L (ref 22–32)

## 2020-12-02 LAB — HEPARIN LEVEL (UNFRACTIONATED)
Heparin Unfractionated: >1.1 IU/mL — ABNORMAL HIGH (ref 0.30–0.70)
Heparin Unfractionated: 1.03 IU/mL — ABNORMAL HIGH (ref 0.30–0.70)

## 2020-12-02 SURGERY — PERIPHERAL VASCULAR THROMBECTOMY
Anesthesia: LOCAL

## 2020-12-02 MED ORDER — FENTANYL CITRATE (PF) 100 MCG/2ML IJ SOLN
INTRAMUSCULAR | Status: DC | PRN
  Administered 2020-12-02: 25 ug via INTRAVENOUS

## 2020-12-02 MED ORDER — MIDAZOLAM HCL 2 MG/2ML IJ SOLN: 1.0000 mg | INTRAMUSCULAR | Status: DC | PRN

## 2020-12-02 MED ORDER — ROSUVASTATIN CALCIUM 20 MG PO TABS
20.0000 mg | ORAL_TABLET | Freq: Every day | ORAL | Status: DC
  Administered 2020-12-02: 20 mg via ORAL
  Filled 2020-12-02: qty 1

## 2020-12-02 MED ORDER — LABETALOL HCL 5 MG/ML IV SOLN: 10.0000 mg | INTRAVENOUS | Status: DC | PRN

## 2020-12-02 MED ORDER — ATENOLOL 25 MG PO TABS
50.0000 mg | ORAL_TABLET | Freq: Every morning | ORAL | Status: DC
  Administered 2020-12-02 – 2020-12-05 (×4): 50 mg via ORAL
  Filled 2020-12-02: qty 2
  Filled 2020-12-02 (×2): qty 1
  Filled 2020-12-02 (×3): qty 2

## 2020-12-02 MED ORDER — LIDOCAINE HCL (PF) 1 % IJ SOLN
INTRAMUSCULAR | Status: DC | PRN
  Administered 2020-12-02: 15 mL via INTRADERMAL

## 2020-12-02 MED ORDER — HEPARIN (PORCINE) 25000 UT/250ML-% IV SOLN
650.0000 [IU]/h | INTRAVENOUS | Status: DC
  Administered 2020-12-02: 800 [IU]/h via INTRAVENOUS
  Filled 2020-12-02 (×2): qty 250

## 2020-12-02 MED ORDER — METOPROLOL TARTRATE 5 MG/5ML IV SOLN
5.0000 mg | Freq: Four times a day (QID) | INTRAVENOUS | Status: DC
  Filled 2020-12-02: qty 5

## 2020-12-02 MED ORDER — MIDAZOLAM HCL 2 MG/2ML IJ SOLN
INTRAMUSCULAR | Status: AC
  Filled 2020-12-02: qty 2

## 2020-12-02 MED ORDER — HEPARIN (PORCINE) IN NACL 1000-0.9 UT/500ML-% IV SOLN
INTRAVENOUS | Status: AC
  Filled 2020-12-02: qty 500

## 2020-12-02 MED ORDER — SODIUM CHLORIDE 0.9% FLUSH
3.0000 mL | Freq: Two times a day (BID) | INTRAVENOUS | Status: DC
  Administered 2020-12-03 – 2020-12-05 (×5): 3 mL via INTRAVENOUS

## 2020-12-02 MED ORDER — HEPARIN SODIUM (PORCINE) 1000 UNIT/ML IJ SOLN
INTRAMUSCULAR | Status: DC | PRN
  Administered 2020-12-02: 7000 [IU] via INTRAVENOUS

## 2020-12-02 MED ORDER — SODIUM CHLORIDE 0.9 % IV SOLN: INTRAVENOUS | Status: DC

## 2020-12-02 MED ORDER — HYDROCODONE-ACETAMINOPHEN 5-325 MG PO TABS
1.0000 | ORAL_TABLET | Freq: Four times a day (QID) | ORAL | Status: DC | PRN
  Administered 2020-12-02: 1 via ORAL
  Filled 2020-12-02: qty 1

## 2020-12-02 MED ORDER — SODIUM CHLORIDE 0.9 % IV SOLN: 250.0000 mL | INTRAVENOUS | Status: DC | PRN

## 2020-12-02 MED ORDER — IODIXANOL 320 MG/ML IV SOLN
INTRAVENOUS | Status: DC | PRN
  Administered 2020-12-02: 65 mL via INTRA_ARTERIAL

## 2020-12-02 MED ORDER — LIDOCAINE HCL (PF) 1 % IJ SOLN
INTRAMUSCULAR | Status: AC
  Filled 2020-12-02: qty 30

## 2020-12-02 MED ORDER — HYDROCODONE-ACETAMINOPHEN 5-325 MG PO TABS
1.0000 | ORAL_TABLET | ORAL | Status: DC | PRN
  Administered 2020-12-02 – 2020-12-05 (×4): 2 via ORAL
  Filled 2020-12-02 (×4): qty 2

## 2020-12-02 MED ORDER — SODIUM CHLORIDE 0.9% FLUSH: 3.0000 mL | INTRAVENOUS | Status: DC | PRN

## 2020-12-02 MED ORDER — SODIUM CHLORIDE 0.9 % IV SOLN
1.0000 mg/h | INTRAVENOUS | Status: DC
  Administered 2020-12-02: 1 mg/h
  Filled 2020-12-02 (×5): qty 10

## 2020-12-02 MED ORDER — GABAPENTIN 100 MG PO CAPS: 100.0000 mg | ORAL_CAPSULE | Freq: Every day | ORAL | Status: DC

## 2020-12-02 MED ORDER — SODIUM CHLORIDE 0.9 % IV SOLN
1.0000 mg/h | INTRAVENOUS | Status: DC
  Administered 2020-12-02: 1 mg/h via INTRAVENOUS
  Filled 2020-12-02: qty 4
  Filled 2020-12-02 (×3): qty 24

## 2020-12-02 MED ORDER — ONDANSETRON HCL 4 MG/2ML IJ SOLN: 4.0000 mg | Freq: Four times a day (QID) | INTRAMUSCULAR | Status: DC | PRN

## 2020-12-02 MED ORDER — MORPHINE SULFATE (PF) 2 MG/ML IV SOLN
2.0000 mg | INTRAVENOUS | Status: DC | PRN
  Administered 2020-12-02 – 2020-12-04 (×13): 2 mg via INTRAVENOUS
  Filled 2020-12-02 (×12): qty 1

## 2020-12-02 MED ORDER — MORPHINE SULFATE (PF) 2 MG/ML IV SOLN: 2.0000 mg | INTRAVENOUS | Status: DC | PRN

## 2020-12-02 MED ORDER — HEPARIN (PORCINE) IN NACL 1000-0.9 UT/500ML-% IV SOLN
INTRAVENOUS | Status: DC | PRN
  Administered 2020-12-02 (×2): 500 mL

## 2020-12-02 MED ORDER — MIDAZOLAM HCL 2 MG/2ML IJ SOLN
INTRAMUSCULAR | Status: DC | PRN
  Administered 2020-12-02: 1 mg via INTRAVENOUS

## 2020-12-02 MED ORDER — MORPHINE SULFATE (PF) 2 MG/ML IV SOLN
2.0000 mg | INTRAVENOUS | Status: DC | PRN
  Filled 2020-12-02: qty 1

## 2020-12-02 MED ORDER — SODIUM CHLORIDE 0.9 % IV SOLN
1.0000 mg/h | Freq: Once | INTRAVENOUS | Status: DC
  Filled 2020-12-02: qty 24

## 2020-12-02 MED ORDER — MORPHINE SULFATE (PF) 2 MG/ML IV SOLN: 5.0000 mg | INTRAVENOUS | Status: DC | PRN

## 2020-12-02 MED ORDER — HEPARIN SODIUM (PORCINE) 1000 UNIT/ML IJ SOLN
INTRAMUSCULAR | Status: AC
  Filled 2020-12-02: qty 1

## 2020-12-02 MED ORDER — FENTANYL CITRATE (PF) 100 MCG/2ML IJ SOLN
INTRAMUSCULAR | Status: AC
  Filled 2020-12-02: qty 2

## 2020-12-02 MED ORDER — CHLORHEXIDINE GLUCONATE CLOTH 2 % EX PADS
6.0000 | MEDICATED_PAD | Freq: Every day | CUTANEOUS | Status: DC
  Administered 2020-12-02 – 2020-12-05 (×4): 6 via TOPICAL

## 2020-12-02 MED ORDER — HYDRALAZINE HCL 20 MG/ML IJ SOLN: 5.0000 mg | INTRAMUSCULAR | Status: DC | PRN

## 2020-12-02 SURGICAL SUPPLY — 11 items
CATH ANGIO 5F BER2 65CM (CATHETERS) ×3 IMPLANT
CATH EKOS ULTRASOUND 135X50 (MISCELLANEOUS) ×3 IMPLANT
CATH OMNI FLUSH 5F 65CM (CATHETERS) ×3 IMPLANT
GLIDEWIRE ADV .035X260CM (WIRE) ×3 IMPLANT
KIT MICROPUNCTURE NIT STIFF (SHEATH) ×3 IMPLANT
SHEATH FLEX ANSEL ANG 6F 45CM (SHEATH) ×3 IMPLANT
SHEATH PINNACLE 5F 10CM (SHEATH) ×3 IMPLANT
SHEATH PROBE COVER 6X72 (BAG) ×3 IMPLANT
STOPCOCK MORSE 400PSI 3WAY (MISCELLANEOUS) ×6 IMPLANT
WIRE BENTSON .035X145CM (WIRE) ×3 IMPLANT
WIRE G V18X300CM (WIRE) ×3 IMPLANT

## 2020-12-02 NOTE — Progress Notes (Addendum)
ANTICOAGULATION CONSULT NOTE - Initial Consult  Pharmacy Consult for heparin Indication:  VTE Treatment  No Known Allergies  Patient Measurements: Height: 5\' 6"  (167.6 cm) Weight: 63.5 kg (140 lb) IBW/kg (Calculated) : 63.8 Heparin Dosing Weight: 63.5 kg   Vital Signs: Temp: 98.1 F (36.7 C) (11/03 1200) Temp Source: Oral (11/03 1200) BP: 132/60 (11/03 1200) Pulse Rate: 65 (11/03 1200)  Labs: Recent Labs    12/02/20 0745  HGB 15.0  HCT 44.0  CREATININE 1.40*    Estimated Creatinine Clearance: 42.8 mL/min (A) (by C-G formula based on SCr of 1.4 mg/dL (H)).   Medical History: Past Medical History:  Diagnosis Date   Hypertension    PVD (peripheral vascular disease) (HCC)     Medications:  Scheduled:   atenolol  50 mg Oral q AM   rosuvastatin  20 mg Oral QHS   sodium chloride flush  3 mL Intravenous Q12H    Assessment: 70 yom who presented with critical limb ischemia - has hx of PVD. On apixaban PTA - LD 11/2.   Hgb 11.6, plt 575. Fibrinogen 721. Underwent catheter directed lysis today - currently with alteplase 1 mg/hr and heparin at 800 units/hr through the sheath. Of note, did receive heparin 7000 unit bolus during procedure.  Initial heparin level came back elevated at >1.1, aPTT came back at 137. No s/sx of bleeding or infusion issues.  Goal of Therapy:  Heparin level 0.2-0.5 units/ml Monitor platelets by anticoagulation protocol: Yes   Plan:  Continue heparin 800 units/hr through sheath given recent bolus- no systemic heparin  Will monitor HL/aPTT in 6 hr to see if need to consider reduction to sheath rate Monitor 6 hr HL/fibrinogen/aPTT/CBC with s/sx of bleeding until relook tomorrow.  13/2, PharmD, BCCCP Clinical Pharmacist  Phone: [phone] 12/02/2020 1:46 PM  Please check AMION for all [facility] phone numbers After 10:00 PM, call Main Pharmacy [phone]

## 2020-12-02 NOTE — H&P (Signed)
History and Physical Interval Note:  12/02/2020 7:47 AM  Ryan [name]  has presented today for surgery, with the diagnosis of ischemia.  The various methods of treatment have been discussed with the patient and family. After consideration of risks, benefits and other options for treatment, the patient has consented to  Procedure(s): PERIPHERAL VASCULAR THROMBECTOMY (N/A) as a surgical intervention.  The patient's history has been reviewed, patient examined, no change in status, stable for surgery.  I have reviewed the patient's chart and labs.  Questions were answered to the patient's satisfaction.    Occluded left leg bypass.  Plan thrombolysis.  Cephus Shelling  Patient name: Ryan [name] MRN: [id number]        DOB: [birth year]          Sex: male   REASON FOR VISIT: Evaluate new left foot rest pain   HPI: Ryan [name] is a 70 y.o. male with history of hypertension and tobacco abuse as well as peripheral vascular disease that presents for evaluation of new left leg rest pain.  He initially had a left common femoral to below-knee pop bypass with propaten by Dr. Imogene Burn in 2018.  This subsequently occluded in 2019 and I performed a redo left common femoral to below-knee popliteal bypass with 6 mm ringed PTFE.  The redo bypass in 2019 lasted until about August 2021 when he presented with critical limb ischemia with an occluded bypass and underwent thrombolysis of the occluded left leg bypass.  This was treated with a proximal 7 x 40 Eluvia and a distal 5 x 60 Tigris on 09/23/2019.  Bypass subsequently occluded again in 2022 and another lysis and then most recently performed thrombectomy of the bypass as well as tibial thrombectomy and removal of the stent across the distal anastomosis with a bovine patch on 06/16/2020.     He started having rest pain in his left foot on Thursday.  He denies any profound motor or sensory deficits other than just pain particularly in the foot itself.  Still smoking about a  pack pack and a half a day.  He is taking his Eliquis.         Past Medical History:  Diagnosis Date   Hypertension     PVD (peripheral vascular disease) [facility])             Past Surgical History:  Procedure Laterality Date   ABDOMINAL AORTAGRAM Left 12/13/2016    ABDOMINAL AORTOGRAM W/LOWER EXTREMITY/notes 12/13/2016   ABDOMINAL AORTOGRAM W/LOWER EXTREMITY N/A 11/28/2016    Procedure: ABDOMINAL AORTOGRAM W/LOWER EXTREMITY;  Surgeon: Yates Decamp, MD;  Location: [facility] INVASIVE CV LAB;  Service: Cardiovascular;  Laterality: N/A;  bilateral   ABDOMINAL AORTOGRAM W/LOWER EXTREMITY N/A 12/13/2016    Procedure: ABDOMINAL AORTOGRAM W/LOWER EXTREMITY;  Surgeon: Maeola Harman, MD;  Location: [facility] INVASIVE CV LAB;  Service: Cardiovascular;  Laterality: N/A;  unilater left leg   ABDOMINAL AORTOGRAM W/LOWER EXTREMITY N/A 01/09/2018    Procedure: ABDOMINAL AORTOGRAM W/LOWER EXTREMITY;  Surgeon: Cephus Shelling, MD;  Location: [facility] INVASIVE CV LAB;  Service: Cardiovascular;  Laterality: N/A;   ABDOMINAL AORTOGRAM W/LOWER EXTREMITY Left 09/22/2019    Procedure: ABDOMINAL AORTOGRAM W/LOWER EXTREMITY;  Surgeon: Maeola Harman, MD;  Location: [facility] INVASIVE CV LAB;  Service: Cardiovascular;  Laterality: Left;   FEMORAL-POPLITEAL BYPASS GRAFT Left 12/14/2016    Procedure: BYPASS GRAFT COMMON FEMORAL- BELOW KNEE POPLITEAL ARTERY with Bovine Patch to Below the knee poplitieal artery.;  Surgeon: Fransisco Hertz, MD;  Location:  [facility] OR;  Service: Vascular;  Laterality: Left;   FEMORAL-POPLITEAL BYPASS GRAFT Left 01/28/2018    Procedure: BYPASS GRAFT FEMORAL-BELOW KNEE POPLITEAL ARTERY REDO with propaten vascular graft removable ring;  Surgeon: Cephus Shelling, MD;  Location: [facility] OR;  Service: Vascular;  Laterality: Left;   FEMUR SURGERY Right 2009    pt. has a rod in that leg   LOWER EXTREMITY ANGIOGRAPHY Left 11/28/2016    Procedure: Lower Extremity Angiography;  Surgeon: Yates Decamp, MD;   Location: [facility] INVASIVE CV LAB;  Service: Cardiovascular;  Laterality: Left;  lysis followup lower leg   PATCH ANGIOPLASTY Left 01/28/2018    Procedure: PATCH ANGIOPLASTY USING Livia Snellen BIOLOGIC PATCH;  Surgeon: Cephus Shelling, MD;  Location: [facility] OR;  Service: Vascular;  Laterality: Left;   PATCH ANGIOPLASTY Left 06/16/2020    Procedure: PATCH ANGIOPLASTY OF LEFT BELOW KNEE POPITEAL ARTERY;  Surgeon: Cephus Shelling, MD;  Location: [facility] OR;  Service: Vascular;  Laterality: Left;   PERIPHERAL VASCULAR BALLOON ANGIOPLASTY   11/28/2016    Procedure: PERIPHERAL VASCULAR BALLOON ANGIOPLASTY;  Surgeon: Yates Decamp, MD;  Location: [facility] INVAS[facility] CV LAB;  Service: Cardiovascular;;  SFA   PERIPHERAL VASCULAR CATHETERIZATION N/A 08/11/2014    Procedure: Lower Extremity Angiography;  Surgeon: Yates Decamp, MD;  Location: [facility] INVASIVE CV LAB;  Service: Cardiovascular;  Laterality: N/A;   PERIPHERAL VASCULAR INTERVENTION Left 09/22/2019    Procedure: PERIPHERAL VASCULAR INTERVENTION;  Surgeon: Maeola Harman, MD;  Location: [facility] INVASIVE CV LAB;  Service: Cardiovascular;  Laterality: Left;   PERIPHERAL VASCULAR INTERVENTION Left 09/23/2019    Procedure: PERIPHERAL VASCULAR INTERVENTION;  Surgeon: Nada Libman, MD;  Location: [facility] INVAS[facility] CV LAB;  Service: Cardiovascular;  Laterality: Left;   PERIPHERAL VASCULAR INTERVENTION Left 05/13/2020    Procedure: PERIPHERAL VASCULAR INTERVENTION;  Surgeon: Cephus Shelling, MD;  Location: [facility] LAB;  Service: Cardiovascular;  Laterality: Left;   PERIPHERAL VASCULAR THROMBECTOMY   11/28/2016    Procedure: PERIPHERAL VASCULAR THROMBECTOMY;  Surgeon: Yates Decamp, MD;  Location: [facility];  Service: Cardiovascular;;  Profunda   PERIPHERAL VASCULAR THROMBECTOMY   11/28/2016    Procedure: PERIPHERAL VASCULAR THROMBECTOMY;  Surgeon: Yates Decamp, MD;  Location: [facility] LAB;  Service: Cardiovascular;;   PERIPHERAL VASCULAR THROMBECTOMY N/A  05/12/2020    Procedure: PERIPHERAL VASCULAR THROMBECTOMY-Left Leg;  Surgeon: Leonie Douglas, MD;  Location: [facility] LAB;  Service: Cardiovascular;  Laterality: N/A;   PERIPHERAL VASCULAR THROMBECTOMY N/A 05/13/2020    Procedure: PERIPHERAL VASCULAR THROMBECTOMY- LYSIS RECHECK;  Surgeon: Cephus Shelling, MD;  Location: [facility]VAS[facility] CV LAB;  Service: Cardiovascular;  Laterality: N/A;   PULMONARY THROMBECTOMY N/A 09/23/2019    Procedure: LYSIS RECHECK;  Surgeon: Nada Libman, MD;  Location: [facility] INVAS[facility] CV LAB;  Service: Cardiovascular;  Laterality: N/A;   THROMBECTOMY FEMORAL ARTERY Left 12/14/2016    Procedure: THROMBECTOMY PROFUNDA FEMORAL ARTERY;  Surgeon: Fransisco Hertz, MD;  Location: [facility] OR;  Service: Vascular;  Laterality: Left;   THROMBECTOMY FEMORAL ARTERY Left 06/16/2020    Procedure: Redo exposure ot Left below knee popiteal artery;  Surgeon: Cephus Shelling, MD;  Location: [facility] OR;  Service: Vascular;  Laterality: Left;   THROMBECTOMY OF BYPASS GRAFT FEMORAL- POPLITEAL ARTERY Left 06/16/2020    Procedure: THROMBECTOMY OF BYPASS GRAFT FEMORAL-POPLITEAL ARTERY and TIBIAL ARTERY;  Surgeon: Cephus Shelling, MD;  Location: [facility] OR;  Service: Vascular;  Laterality: Left;  Family History  Problem Relation Age of Onset   Hypertension Mother     Diabetes Mother     Hypertension Father        SOCIAL HISTORY: Social History         Tobacco Use   Smoking status: Every Day      Packs/day: 0.75      Years: 56.00      Pack years: 42.00      Types: Cigarettes   Smokeless tobacco: Never  Substance Use Topics   Alcohol use: Yes      Alcohol/week: 10.0 standard drinks      Types: 10 Shots of liquor per week      Comment:  01/29/2018 "1-2 shots per night"      No Known Allergies         Current Outpatient Medications  Medication Sig Dispense Refill   apixaban (ELIQUIS) 5 MG TABS tablet Take 1 tablet (5 mg total) by mouth 2 (two) times daily. 60 tablet 11    apixaban (ELIQUIS) 5 MG TABS tablet Take 1 tablet (5 mg total) by mouth 2 (two) times daily. 60 tablet 3   clopidogrel (PLAVIX) 75 MG tablet TAKE 1 TABLET BY MOUTH EVERY DAY 90 tablet 3   ELIQUIS 5 MG TABS tablet TAKE 1 TABLET BY MOUTH TWICE A DAY 60 tablet 0   gabapentin (NEURONTIN) 100 MG capsule TAKE 1 CAPSULE BY MOUTH AT BEDTIME. 30 capsule 0   ketoconazole (NIZORAL) 2 % cream Apply to toes and interspaces twice daily for dead skin/ itching 60 g 2   losartan-hydrochlorothiazide (HYZAAR) 100-25 MG tablet Take 1 tablet by mouth daily.        oxyCODONE-acetaminophen (PERCOCET) 5-325 MG tablet Take 1 tablet by mouth every 6 (six) hours as needed for severe pain. 20 tablet 0   rosuvastatin (CRESTOR) 20 MG tablet Take 20 mg by mouth at bedtime.       aspirin EC 81 MG tablet Take 81 mg by mouth daily. Swallow whole. (Patient not taking: Reported on 11/30/2020)        No current facility-administered medications for this visit.      REVIEW OF SYSTEMS:  [X]  denotes positive finding, [ ]  denotes negative finding Cardiac   Comments:  Chest pain or chest pressure:      Shortness of breath upon exertion:      Short of breath when lying flat:      Irregular heart rhythm:             Vascular      Pain in calf, thigh, or hip brought on by ambulation:      Pain in feet at night that wakes you up from your sleep:       Blood clot in your veins:      Leg swelling:              Pulmonary      Oxygen at home:      Productive cough:       Wheezing:              Neurologic      Sudden weakness in arms or legs:       Sudden numbness in arms or legs:       Sudden onset of difficulty speaking or slurred speech:      Temporary loss of vision in one eye:       Problems with dizziness:  Gastrointestinal      Blood in stool:       Vomited blood:              Genitourinary      Burning when urinating:       Blood in urine:             Psychiatric      Major depression:               Hematologic      Bleeding problems:      Problems with blood clotting too easily:             Skin      Rashes or ulcers:             Constitutional      Fever or chills:          PHYSICAL EXAM:    Vitals:    11/30/20 0935  BP: 123/75  Pulse: 63  Temp: 98.3 F (36.8 C)  TempSrc: Skin  SpO2: 95%  Weight: 137 lb (62.1 kg)      GENERAL: The patient is a well-nourished male, in no acute distress. The vital signs are documented above. CARDIAC: There is a regular rate and rhythm.  VASCULAR:  Palpable femoral pulses bilaterally Left groin and leg incision previously well-healed Left foot is grossly motor intact Dependent rubor of left foot and cool to touch   DATA:    Lower extremity arterial duplex shows left leg bypass is occluded.   ABI on the left is 0.22 dampened monophasic   Assessment/Plan:   70 year old male presents with occluded redo left common femoral to below-knee popliteal bypass.  This underwent lysis earlier this year and on 06/16/2020 I performed thrombectomy of the bypass with thrombectomy of the tibial vessels and removal of the below-knee popliteal stent and a bovine patch of the distal anastomosis.  I discussed with him he only has single-vessel peroneal runoff and may have limited options but we will attempt thrombolysis again for bypass and limb salvage.  We will post for Thursday in the Cath Lab with me with a recheck on Friday.  He is still smoking and is at very high risk for limb loss.  Risk benefits discussed including high risk for bleeding.   Cephus Shelling, MD [facility] Office: [phone]   Cephus Shelling

## 2020-12-02 NOTE — Op Note (Signed)
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  Ultrasound-guided access right common femoral artery 2.  Aortogram with catheter selection of aorta 3.  Left lower extremity arteriogram with selection of third order branches 4.  Left lower extremity thrombolysis of occluded bypass graft (EKOS, 135 cm x 50 cm Ekosonic catheter) 5.  45 minutes monitored moderate conscious sedation time  Indications: Patient is a 70 year old male who has previously undergone redo left common femoral to below-knee popliteal artery bypass with prosthetic graft for critical limb ischemia.  He has had multiple attempts at salvage of this graft including thrombolysis and previous distal revision with patch angioplasty.  He presented to the office on Tuesday with new rest pain symptoms since last Thursday and evidence of occluded bypass.  We recommend thrombolysis of his occluded bypass graft.  He presents after risks benefits discussed.  Findings:   Aortogram showed patent renal arteries bilaterally and a patent infrarenal aorta.  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    The inflow on the left is widely patent including the common and external iliac artery and the common femoral and profunda.  The left common femoral to below-knee prosthetic bypass was occluded.  I was able to cannulate the bypass and had some trouble getting across the proximal Eluviu stent in the proximal bypass that was occluded.  Ultimately I was then able to get through the entire bypass and get a wire down the peroneal distally which is the dominant runoff and we placed a EKOS catheter for  initiation of thrombolysis.   Procedure:  The patient was identified in the holding area and taken to room 8.  The patient was then placed supine on the table and prepped and draped in the usual sterile fashion.  A time out was called.  Ultrasound was used to evaluate the right common femoral artery.  It was patent .  A digital ultrasound image was acquired.  A micropuncture needle was used to access the right common femoral artery under ultrasound guidance.  An 018 wire was advanced without resistance and a micropuncture sheath was placed.  The 018 wire was removed and a benson wire was placed.  The micropuncture sheath was exchanged for a 5 french sheath.  An omniflush catheter was advanced over the wire to the level of L-1.  An abdominal angiogram was obtained.  Next, using the omniflush catheter and a benson wire, the aortic bifurcation was crossed and the catheter was placed into theleft external iliac artery.  I went ahead and gave the patient 7000 units of IV heparin and a long 6 [place] Ansell sheath was placed in the right groin over the aortic bifurcation into the left common femoral.  Then used a KMP catheter with a Glidewire advantage to cannulate the occluded bypass.  It should be noted that had some trouble getting across the stent in the proximal bypass where I suspect there is some disease.  Once I crossed the proximal stents I then easily crossed the occluded bypass and got across the distal anastomosis into the peroneal which is dominant runoff.  We confirmed with hand-injection we were in the true lumen.  I then placed a 135 cm  length EKOS catheter over the wire through the right groin sheath across the occluded bypass into the proximal peroneal that had a 50 cm infusion length.  We then placed the inner catheter in the EKOS catheter.  tPA will run at 1 mg an hour through the EKOS catheter and heparin 800 units an hour through the sheath.  Remained stable and will be taken to the ICU.  The sheath  was secured.  Plan: Patient will go to the ICU for thrombolysis of occluded left leg bypass graft with EKOS.  Plan to return to cath lab tomorrow for lytics catheter check.   Cephus Shelling, MD [facility] Office: [phone]

## 2020-12-02 NOTE — Progress Notes (Signed)
ANTICOAGULATION CONSULT NOTE - Initial Consult  Pharmacy Consult for heparin Indication:  VTE Treatment  No Known Allergies  Patient Measurements: Height: 5\' 6"  (167.6 cm) Weight: 63.5 kg (140 lb) IBW/kg (Calculated) : 63.8 Heparin Dosing Weight: 63.5 kg   Vital Signs: Temp: 98.6 F (37 C) (11/03 1630) Temp Source: Oral (11/03 1630) BP: 153/69 (11/03 2000) Pulse Rate: 63 (11/03 2000)  Labs: Recent Labs    12/02/20 0745 12/02/20 1243 12/02/20 1831  HGB 15.0 11.6* 11.0*  HCT 44.0 37.8* 34.6*  PLT  --  575* 444*  APTT  --  134* 100*  HEPARINUNFRC  --  >1.10* 1.03*  CREATININE 1.40*  --   --      Estimated Creatinine Clearance: 42.8 mL/min (A) (by C-G formula based on SCr of 1.4 mg/dL (H)).   Medical History: Past Medical History:  Diagnosis Date   Hypertension    PVD (peripheral vascular disease) (HCC)     Medications:  Scheduled:   atenolol  50 mg Oral q AM   Chlorhexidine Gluconate Cloth  6 each Topical Daily   rosuvastatin  20 mg Oral QHS   sodium chloride flush  3 mL Intravenous Q12H    Assessment: 70 yom who presented with critical limb ischemia - has hx of PVD. On apixaban PTA - LD 11/2.   Hgb 11.6, plt 575. Fibrinogen 721. Underwent catheter directed lysis today - currently with alteplase 1 mg/hr and heparin at 800 units/hr through the sheath. Of note, did receive heparin 7000 unit bolus during procedure.  Heparin level came back elevated at 1 - remains elevated from recent apixaban. aPTT 100sec > goal 66-90sec (correlates to heparin level 0.2-0.5) . No s/sx of bleeding or infusion issues. Fibrinogen down to 298, h/h pltc stable   Goal of Therapy:  Heparin level 0.2-0.5 units/ml Aptt 66-90 sec  Monitor platelets by anticoagulation protocol: Yes   Plan:  Decrease  heparin drip slightly 700 units/hr through sheath  no systemic heparin  Monitor 6 hr HL/fibrinogen/aPTT/CBC with s/sx of bleeding until relook tomorrow.    13/2 Pharm.D. CPP,  BCPS Clinical Pharmacist [phone] 12/02/2020 9:25 PM   Please check AMION for all [facility] Pharmacy phone numbers After 10:00 PM, call Main Pharmacy [phone]

## 2020-12-02 NOTE — Plan of Care (Signed)
  Problem: Education: Goal: Understanding of CV disease, CV risk reduction, and recovery process will improve Outcome: Progressing   Problem: Activity: Goal: Ability to return to baseline activity level will improve Outcome: Not Progressing   Problem: Cardiovascular: Goal: Vascular access site(s) Level 0-1 will be maintained Outcome: Progressing

## 2020-12-03 ENCOUNTER — Encounter (HOSPITAL_COMMUNITY): Payer: Self-pay | Admitting: Vascular Surgery

## 2020-12-03 ENCOUNTER — Inpatient Hospital Stay (HOSPITAL_COMMUNITY)
Admission: RE | Disposition: A | Discharge status: Home Health | Payer: Self-pay | Source: Ambulatory Visit | Attending: Vascular Surgery

## 2020-12-03 ENCOUNTER — Ambulatory Visit (HOSPITAL_COMMUNITY): Admission: RE | Admit: 2020-12-03 | Payer: Medicare Other | Source: Home / Self Care | Admitting: Vascular Surgery

## 2020-12-03 DIAGNOSIS — I743 Embolism and thrombosis of arteries of the lower extremities: Secondary | ICD-10-CM

## 2020-12-03 DIAGNOSIS — T82868A Thrombosis of vascular prosthetic devices, implants and grafts, initial encounter: Secondary | ICD-10-CM

## 2020-12-03 DIAGNOSIS — I723 Aneurysm of iliac artery: Secondary | ICD-10-CM

## 2020-12-03 HISTORY — PX: PERIPHERAL VASCULAR BALLOON ANGIOPLASTY: CATH118281

## 2020-12-03 HISTORY — PX: PERIPHERAL VASCULAR THROMBECTOMY: CATH118306

## 2020-12-03 HISTORY — PX: EMBOLIZATION (CATH LAB): CATH118239

## 2020-12-03 HISTORY — PX: PERIPHERAL VASCULAR INTERVENTION: CATH118257

## 2020-12-03 LAB — CBC
HCT: 34.3 % — ABNORMAL LOW (ref 39.0–52.0)
HCT: 33.6 % — ABNORMAL LOW (ref 39.0–52.0)
Hemoglobin: 10.6 g/dL — ABNORMAL LOW (ref 13.0–17.0)
Hemoglobin: 10.2 g/dL — ABNORMAL LOW (ref 13.0–17.0)
MCH: 25.4 pg — ABNORMAL LOW (ref 26.0–34.0)
MCH: 25.2 pg — ABNORMAL LOW (ref 26.0–34.0)
MCHC: 30.9 g/dL (ref 30.0–36.0)
MCHC: 30.4 g/dL (ref 30.0–36.0)
MCV: 82.1 fL (ref 80.0–100.0)
MCV: 83 fL (ref 80.0–100.0)
Platelets: 386 10*3/uL (ref 150–400)
Platelets: 368 10*3/uL (ref 150–400)
RBC: 4.18 MIL/uL — ABNORMAL LOW (ref 4.22–5.81)
RBC: 4.05 MIL/uL — ABNORMAL LOW (ref 4.22–5.81)
RDW: 15.4 % (ref 11.5–15.5)
RDW: 15.3 % (ref 11.5–15.5)
WBC: 10.8 10*3/uL — ABNORMAL HIGH (ref 4.0–10.5)
WBC: 9.9 10*3/uL (ref 4.0–10.5)
nRBC: 0 % (ref 0.0–0.2)
nRBC: 0 % (ref 0.0–0.2)

## 2020-12-03 LAB — MRSA NEXT GEN BY PCR, NASAL: MRSA by PCR Next Gen: NOT DETECTED

## 2020-12-03 LAB — BASIC METABOLIC PANEL
Anion gap: 8 (ref 5–15)
Anion gap: 8 (ref 5–15)
BUN: 22 mg/dL (ref 8–23)
BUN: 21 mg/dL (ref 8–23)
CO2: 20 mmol/L — ABNORMAL LOW (ref 22–32)
CO2: 16 mmol/L — ABNORMAL LOW (ref 22–32)
Calcium: 8.2 mg/dL — ABNORMAL LOW (ref 8.9–10.3)
Calcium: 8 mg/dL — ABNORMAL LOW (ref 8.9–10.3)
Chloride: 111 mmol/L (ref 98–111)
Chloride: 112 mmol/L — ABNORMAL HIGH (ref 98–111)
Creatinine, Ser: 1.05 mg/dL (ref 0.61–1.24)
Creatinine, Ser: 1.01 mg/dL (ref 0.61–1.24)
GFR, Estimated: >60 mL/min (ref >60)
GFR, Estimated: >60 mL/min (ref >60)
Glucose, Bld: 131 mg/dL — ABNORMAL HIGH (ref 70–99)
Glucose, Bld: 122 mg/dL — ABNORMAL HIGH (ref 70–99)
Potassium: 2.3 mmol/L — CL (ref 3.5–5.1)
Potassium: 3.2 mmol/L — ABNORMAL LOW (ref 3.5–5.1)
Sodium: 139 mmol/L (ref 135–145)
Sodium: 136 mmol/L (ref 135–145)

## 2020-12-03 LAB — APTT
aPTT: 96 seconds — ABNORMAL HIGH (ref 24–36)
aPTT: 94 seconds — ABNORMAL HIGH (ref 24–36)

## 2020-12-03 LAB — FIBRINOGEN
Fibrinogen: 202 mg/dL — ABNORMAL LOW (ref 210–475)
Fibrinogen: 151 mg/dL — ABNORMAL LOW (ref 210–475)

## 2020-12-03 LAB — MAGNESIUM: Magnesium: 1.5 mg/dL — ABNORMAL LOW (ref 1.7–2.4)

## 2020-12-03 LAB — HEPARIN LEVEL (UNFRACTIONATED)
Heparin Unfractionated: 0.97 IU/mL — ABNORMAL HIGH (ref 0.30–0.70)
Heparin Unfractionated: 0.81 IU/mL — ABNORMAL HIGH (ref 0.30–0.70)

## 2020-12-03 LAB — SARS CORONAVIRUS 2 BY RT PCR (HOSPITAL ORDER, PERFORMED IN ~~LOC~~ HOSPITAL LAB): SARS Coronavirus 2: NEGATIVE

## 2020-12-03 SURGERY — PERIPHERAL VASCULAR THROMBECTOMY
Anesthesia: LOCAL

## 2020-12-03 MED ORDER — POTASSIUM CHLORIDE CRYS ER 20 MEQ PO TBCR
40.0000 meq | EXTENDED_RELEASE_TABLET | Freq: Once | ORAL | Status: AC
  Administered 2020-12-03: 40 meq via ORAL
  Filled 2020-12-03: qty 2

## 2020-12-03 MED ORDER — CEFAZOLIN SODIUM-DEXTROSE 2-3 GM-%(50ML) IV SOLR
INTRAVENOUS | Status: AC | PRN
  Administered 2020-12-03: 2 g via INTRAVENOUS

## 2020-12-03 MED ORDER — APIXABAN 5 MG PO TABS
5.0000 mg | ORAL_TABLET | Freq: Two times a day (BID) | ORAL | Status: DC
  Administered 2020-12-03 – 2020-12-05 (×4): 5 mg via ORAL
  Filled 2020-12-03 (×4): qty 1

## 2020-12-03 MED ORDER — CEFAZOLIN SODIUM-DEXTROSE 2-4 GM/100ML-% IV SOLN
INTRAVENOUS | Status: AC
  Filled 2020-12-03: qty 100

## 2020-12-03 MED ORDER — CLOPIDOGREL BISULFATE 75 MG PO TABS
ORAL_TABLET | ORAL | Status: AC
  Filled 2020-12-03: qty 1

## 2020-12-03 MED ORDER — CLOPIDOGREL BISULFATE 75 MG PO TABS
75.0000 mg | ORAL_TABLET | Freq: Every day | ORAL | Status: DC
  Administered 2020-12-04 – 2020-12-05 (×2): 75 mg via ORAL
  Filled 2020-12-03 (×2): qty 1

## 2020-12-03 MED ORDER — CLOPIDOGREL BISULFATE 300 MG PO TABS
ORAL_TABLET | ORAL | Status: DC | PRN
  Administered 2020-12-03: 75 mg via ORAL

## 2020-12-03 MED ORDER — POTASSIUM CHLORIDE 10 MEQ/100ML IV SOLN
10.0000 meq | INTRAVENOUS | Status: AC
  Administered 2020-12-03 (×4): 10 meq via INTRAVENOUS
  Filled 2020-12-03 (×3): qty 100

## 2020-12-03 MED ORDER — IODIXANOL 320 MG/ML IV SOLN
INTRAVENOUS | Status: DC | PRN
  Administered 2020-12-03: 70 mL via INTRA_ARTERIAL

## 2020-12-03 MED ORDER — POTASSIUM CHLORIDE CRYS ER 20 MEQ PO TBCR: 60.0000 meq | EXTENDED_RELEASE_TABLET | Freq: Once | ORAL | Status: DC

## 2020-12-03 MED ORDER — SODIUM CHLORIDE 0.9% FLUSH
3.0000 mL | Freq: Two times a day (BID) | INTRAVENOUS | Status: DC
  Administered 2020-12-04 – 2020-12-05 (×3): 3 mL via INTRAVENOUS

## 2020-12-03 MED ORDER — MIDAZOLAM HCL 2 MG/2ML IJ SOLN
INTRAMUSCULAR | Status: DC | PRN
  Administered 2020-12-03 (×2): 1 mg via INTRAVENOUS

## 2020-12-03 MED ORDER — FENTANYL CITRATE (PF) 100 MCG/2ML IJ SOLN
INTRAMUSCULAR | Status: DC | PRN
  Administered 2020-12-03 (×2): 25 ug via INTRAVENOUS

## 2020-12-03 MED ORDER — LABETALOL HCL 5 MG/ML IV SOLN
10.0000 mg | INTRAVENOUS | Status: DC | PRN
  Filled 2020-12-03: qty 4

## 2020-12-03 MED ORDER — SODIUM BICARBONATE 8.4 % IV SOLN: 50.0000 meq | Freq: Once | INTRAVENOUS | Status: DC

## 2020-12-03 MED ORDER — POTASSIUM CHLORIDE 10 MEQ/100ML IV SOLN: 10.0000 meq | INTRAVENOUS | Status: DC

## 2020-12-03 MED ORDER — HEPARIN (PORCINE) IN NACL 1000-0.9 UT/500ML-% IV SOLN
INTRAVENOUS | Status: AC
  Filled 2020-12-03: qty 500

## 2020-12-03 MED ORDER — MAGNESIUM SULFATE 2 GM/50ML IV SOLN
2.0000 g | Freq: Once | INTRAVENOUS | Status: AC
  Administered 2020-12-03: 2 g via INTRAVENOUS
  Filled 2020-12-03: qty 50

## 2020-12-03 MED ORDER — HEPARIN (PORCINE) IN NACL 1000-0.9 UT/500ML-% IV SOLN
INTRAVENOUS | Status: DC | PRN
  Administered 2020-12-03 (×2): 500 mL

## 2020-12-03 MED ORDER — MIDAZOLAM HCL 2 MG/2ML IJ SOLN
INTRAMUSCULAR | Status: AC
  Filled 2020-12-03: qty 2

## 2020-12-03 MED ORDER — LIDOCAINE HCL (PF) 1 % IJ SOLN
INTRAMUSCULAR | Status: AC
  Filled 2020-12-03: qty 30

## 2020-12-03 MED ORDER — ACETAMINOPHEN 325 MG PO TABS: 650.0000 mg | ORAL_TABLET | ORAL | Status: DC | PRN

## 2020-12-03 MED ORDER — ROSUVASTATIN CALCIUM 20 MG PO TABS
20.0000 mg | ORAL_TABLET | Freq: Every day | ORAL | Status: DC
  Administered 2020-12-03 – 2020-12-05 (×3): 20 mg via ORAL
  Filled 2020-12-03 (×3): qty 1

## 2020-12-03 MED ORDER — POTASSIUM CHLORIDE CRYS ER 20 MEQ PO TBCR
20.0000 meq | EXTENDED_RELEASE_TABLET | Freq: Once | ORAL | Status: AC
  Administered 2020-12-03: 20 meq via ORAL
  Filled 2020-12-03: qty 1

## 2020-12-03 MED ORDER — FENTANYL CITRATE (PF) 100 MCG/2ML IJ SOLN
INTRAMUSCULAR | Status: AC
  Filled 2020-12-03: qty 2

## 2020-12-03 MED ORDER — SODIUM CHLORIDE 0.9 % WEIGHT BASED INFUSION
1.0000 mL/kg/h | INTRAVENOUS | Status: AC
  Administered 2020-12-03: 1 mL/kg/h via INTRAVENOUS

## 2020-12-03 MED ORDER — SODIUM CHLORIDE 0.9% FLUSH: 3.0000 mL | INTRAVENOUS | Status: DC | PRN

## 2020-12-03 MED ORDER — SODIUM CHLORIDE 0.9 % IV SOLN: 250.0000 mL | INTRAVENOUS | Status: DC | PRN

## 2020-12-03 SURGICAL SUPPLY — 16 items
BALLN MUSTANG 9X20X75 (BALLOONS) ×4
BALLN STERLING OTW 3X60X150 (BALLOONS) ×4
BALLOON MUSTANG 9X20X75 (BALLOONS) IMPLANT
BALLOON STERLING OTW 3X60X150 (BALLOONS) IMPLANT
CATH ANGIO 5F BER2 65CM (CATHETERS) ×1 IMPLANT
CLOSURE PERCLOSE PROSTYLE (VASCULAR PRODUCTS) ×1 IMPLANT
COIL NESTER 14X6 (Embolic) ×3 IMPLANT
GUIDEWIRE ANGLED .035X150CM (WIRE) ×1 IMPLANT
KIT ENCORE 26 ADVANTAGE (KITS) ×1 IMPLANT
KIT PV (KITS) ×1 IMPLANT
SHEATH PINNACLE MP 7F 45CM (SHEATH) ×1 IMPLANT
STENT VIABAHN VBX 7X59X80 (Permanent Stent) ×1 IMPLANT
TRAY PV CATH (CUSTOM PROCEDURE TRAY) ×1 IMPLANT
WIRE BENTSON .035X145CM (WIRE) ×1 IMPLANT
WIRE G V18X300CM (WIRE) ×1 IMPLANT
WIRE ROSEN-J .035X180CM (WIRE) ×1 IMPLANT

## 2020-12-03 NOTE — Progress Notes (Signed)
[facility]  Subjective  -states his left foot feels much better   Objective (!) 113/44 63 98.6 F (37 C) (Oral) 16 94%  Intake/Output Summary (Last 24 hours) at 12/03/2020 0824 Last data filed at 12/03/2020 0646 Gross per 24 hour  Intake 1955.5 ml  Output 2000 ml  Net -44.5 ml    Left peroneal and PT signal brisk by Doppler Left foot warm Right groin sheath access site with no significant hematoma and a small bit of oozing around the dressing  Laboratory Lab Results: Recent Labs    12/03/20 0111 12/03/20 0641  WBC 10.8* 9.9  HGB 10.6* 10.2*  HCT 34.3* 33.6*  PLT 386 368   BMET Recent Labs    12/02/20 0745 12/03/20 0641  NA 143 139  K 3.6 2.3*  CL 107 111  CO2  --  20*  GLUCOSE 156* 131*  BUN 54* 22  CREATININE 1.40* 1.05  CALCIUM  --  8.2*    COAG Lab Results  Component Value Date   INR 1.0 06/16/2020   INR 0.97 01/17/2018   INR 0.9 06/16/2008   No results found for: PTT  Assessment/Planning:  70 year old male underwent lysis with EKOS catheter of occluded left lower extremity bypass overnight.  He has better signals this morning and the left foot is much warmer.  Plan to return to the Cath Lab with Dr. [name] for lytics catheter check today.  Continue NPO.  Continue alteplase at 1 mg/hr hour through EKOS catheter with heparin through the sheath.  Hemoglobin 10.2 this morning.  We will order 60 mEq potassium orally given potassium of 2.3.  Fibrinogen 721 yesterday to 151 this am.  Cephus Shelling 12/03/2020 8:24 AM --

## 2020-12-03 NOTE — Progress Notes (Signed)
ANTICOAGULATION CONSULT NOTE Pharmacy Consult for heparin Indication:  VTE Treatment  No Known Allergies  Patient Measurements: Height: 5\' 6"  (167.6 cm) Weight: 63.5 kg (140 lb) IBW/kg (Calculated) : 63.8 Heparin Dosing Weight: 63.5 kg   Vital Signs: Temp: 98.6 F (37 C) (11/03 1630) Temp Source: Oral (11/03 1630) BP: 127/61 (11/04 0100) Pulse Rate: 65 (11/04 0100)  Labs: Recent Labs    12/02/20 0745 12/02/20 1243 12/02/20 1831 12/03/20 0111  HGB 15.0 11.6* 11.0* 10.6*  HCT 44.0 37.8* 34.6* 34.3*  PLT  --  575* 444* 386  APTT  --  134* 100* 96*  HEPARINUNFRC  --  >1.10* 1.03* 0.97*  CREATININE 1.40*  --   --   --      Estimated Creatinine Clearance: 42.8 mL/min (A) (by C-G formula based on SCr of 1.4 mg/dL (H)).   Assessment: 70 yo male undergoing EKOS thrombolysis of occluded bypass graft for heparin.  aPTT above goal range.  Goal of Therapy:  Heparin level 0.2-0.5 units/ml Aptt 66-90 sec  Monitor platelets by anticoagulation protocol: Yes   Plan:  Decrease Heparin 650 units/hr F/U after arteriogram in am  61, PharmD, BCPS

## 2020-12-03 NOTE — Op Note (Signed)
DATE OF SERVICE: 12/03/2020  PATIENT:  Ryan [name]  70 y.o. male  PRE-OPERATIVE DIAGNOSIS:  Thrombosis of left lower extremity fem-popliteal bypass graft, ongoing catheter directed thrombolysis  POST-OPERATIVE DIAGNOSIS:  Same  PROCEDURE:   1) Left lower extremity angiogram with third order cannulation (74mL total contrast) 2) Left tibioperoneal trunk angioplasty (3x14mm Sterling) 3) Left peroneal artery angioplasty (3x33mm Sterling) 4) Left hypogastric artery coil embolization (6x14cm Nestor coil x 3) 5) Endovascular left common iliac artery aneurysm repair (7x98mm VBX, post-dilated to 9.51mm in left common iliac).  6) Conscious sedation (65 minutes)  SURGEON:  [name], MD  ASSISTANT: none  ANESTHESIA:   local and IV sedation  ESTIMATED BLOOD LOSS: minimal  LOCAL MEDICATIONS USED:  LIDOCAINE   COUNTS: confirmed correct.  PATIENT DISPOSITION:  PACU   Delay start of Pharmacological VTE agent (>24hrs) due to surgical blood loss or risk of bleeding: no  INDICATION FOR PROCEDURE: Ryan [name] is a 70 y.o. male with thrombosis of left femoral popliteal artery bypass graft.  Dr. Chestine Spore initiated catheter directed thrombolysis yesterday.  The patient had a good clinical result overnight with restoration of a peroneal signal this morning. After careful discussion of risks, benefits, and alternatives the patient was offered second look angiography. The patient understood and wished to proceed.  OPERATIVE FINDINGS:  Good technical result from thrombolysis. 2 areas of tibioperoneal and peroneal artery stenosis resolved with simple angioplasty. Diminutive outflow arteries. No technical problems in the graft. Inflow stent widely patent Left common iliac artery aneurysm successfully treated with coil embolization of the left internal iliac artery and covered stenting extending from the common iliac to the external iliac artery.  I postdilated the common iliac segment with the 9 mm  balloon.  No evidence of endoleak at completion.  DESCRIPTION OF PROCEDURE: After identification of the patient in the pre-operative holding area, the patient was transferred to the operating room. The patient was positioned supine on the operating room table. Anesthesia was induced. The groins was prepped and draped in standard fashion. A surgical pause was performed confirming correct patient, procedure, and operative location.  The catheter directed thrombolysis was exchanged over a V 18 wire.  I navigated the V 18 into the the peroneal artery.  Angiography was performed in stations in the left lower extremity.  Identified 2 areas of stenosis in the outflow 1 in the tibioperoneal trunk and 1 in the peroneal artery.  Both were angioplastied with a 3 x 60 mm Sterling balloon.  Completion angiography showed good result.  We next turned our attention to saccular left common iliac artery aneurysm.  I withdrew the sheath into the terminal aorta and selected the left hypogastric artery with a Berenstein catheter.  I navigated a Glidewire into the hypogastric artery and advanced the catheter over the wire.  I confirmed our position in the internal iliac artery.  The internal iliac artery was then coil embolized using three 6 cm x 14 cm Nester coils.  This resulted in a good seal.  I then withdrew the catheter and selected the external iliac artery and vascular graft with a Glidewire.  Through a series of catheter and wire exchanges a Rosen wire was delivered into the vascular graft.  A 7 x 59 mm VBX stent was then delivered across the bifurcation of the left common iliac artery and deployed in standard fashion.  This achieved good apposition in the external iliac artery.  I postdilated the segment in the common iliac artery with a  9 x 20 mm Mustang balloon.  This achieved good apposition to the common iliac artery walls.  Completion angiography showed exclusion of the iliac aneurysm.  Satisfied we ended the case  here.  The access was closed with a Perclose device.  Hemostasis was good upon completion.  A sterile bandage was applied.  Conscious sedation was administered with the use of IV fentanyl and midazolam under continuous physician and nurse monitoring.  Heart rate, blood pressure, and oxygen saturation were continuously monitored.  Total sedation time was 65 minutes  Upon completion of the case instrument and sharps counts were confirmed correct. The patient was transferred to the PACU in good condition. I was present for all portions of the procedure.  PLAN: Continue maximal medical therapy for peripheral arterial disease.  Okay to transfer to stepdown floor.  [name], MD [facility] Phone Number: [phone] 12/03/2020 12:52 PM

## 2020-12-04 DIAGNOSIS — Z7902 Long term (current) use of antithrombotics/antiplatelets: Secondary | ICD-10-CM

## 2020-12-04 DIAGNOSIS — Z7982 Long term (current) use of aspirin: Secondary | ICD-10-CM

## 2020-12-04 LAB — CBC
HCT: 33.1 % — ABNORMAL LOW (ref 39.0–52.0)
Hemoglobin: 10 g/dL — ABNORMAL LOW (ref 13.0–17.0)
MCH: 25.3 pg — ABNORMAL LOW (ref 26.0–34.0)
MCHC: 30.2 g/dL (ref 30.0–36.0)
MCV: 83.8 fL (ref 80.0–100.0)
Platelets: 312 10*3/uL (ref 150–400)
RBC: 3.95 MIL/uL — ABNORMAL LOW (ref 4.22–5.81)
RDW: 15.3 % (ref 11.5–15.5)
WBC: 12.4 10*3/uL — ABNORMAL HIGH (ref 4.0–10.5)
nRBC: 0 % (ref 0.0–0.2)

## 2020-12-04 LAB — BASIC METABOLIC PANEL
Anion gap: 9 (ref 5–15)
BUN: 19 mg/dL (ref 8–23)
CO2: 16 mmol/L — ABNORMAL LOW (ref 22–32)
Calcium: 8 mg/dL — ABNORMAL LOW (ref 8.9–10.3)
Chloride: 113 mmol/L — ABNORMAL HIGH (ref 98–111)
Creatinine, Ser: 1.07 mg/dL (ref 0.61–1.24)
GFR, Estimated: >60 mL/min (ref >60)
Glucose, Bld: 118 mg/dL — ABNORMAL HIGH (ref 70–99)
Potassium: 3.4 mmol/L — ABNORMAL LOW (ref 3.5–5.1)
Sodium: 138 mmol/L (ref 135–145)

## 2020-12-04 LAB — MAGNESIUM: Magnesium: 1.4 mg/dL — ABNORMAL LOW (ref 1.7–2.4)

## 2020-12-04 LAB — PHOSPHORUS: Phosphorus: 1.8 mg/dL — ABNORMAL LOW (ref 2.5–4.6)

## 2020-12-04 MED ORDER — CLOPIDOGREL BISULFATE 75 MG PO TABS: 75.0000 mg | ORAL_TABLET | Freq: Every day | ORAL | Status: DC

## 2020-12-04 MED ORDER — POTASSIUM CHLORIDE CRYS ER 20 MEQ PO TBCR
20.0000 meq | EXTENDED_RELEASE_TABLET | Freq: Once | ORAL | Status: AC
  Administered 2020-12-04: 20 meq via ORAL
  Filled 2020-12-04: qty 1

## 2020-12-04 MED ORDER — MAGNESIUM SULFATE 2 GM/50ML IV SOLN
2.0000 g | Freq: Once | INTRAVENOUS | Status: AC
  Administered 2020-12-04: 2 g via INTRAVENOUS
  Filled 2020-12-04: qty 50

## 2020-12-04 NOTE — Progress Notes (Signed)
[facility]  Subjective  - has not been out of bed.  Foot feels better.   Objective (!) 105/52 66 98.3 F (36.8 C) (Oral) 20 96%  Intake/Output Summary (Last 24 hours) at 12/04/2020 0915 Last data filed at 12/04/2020 0800 Gross per 24 hour  Intake 1006.6 ml  Output 1700 ml  Net -693.4 ml    Right groin access site clean dry and intact with no hematoma Left peroneal signal brisk by Doppler Left foot warm  Laboratory Lab Results: Recent Labs    12/03/20 0641 12/04/20 0238  WBC 9.9 12.4*  HGB 10.2* 10.0*  HCT 33.6* 33.1*  PLT 368 312   BMET Recent Labs    12/03/20 1554 12/04/20 0238  NA 136 138  K 3.2* 3.4*  CL 112* 113*  CO2 16* 16*  GLUCOSE 122* 118*  BUN 21 19  CREATININE 1.01 1.07  CALCIUM 8.0* 8.0*    COAG Lab Results  Component Value Date   INR 1.0 06/16/2020   INR 0.97 01/17/2018   INR 0.9 06/16/2008   No results found for: PTT  Assessment/Planning:  Postop day 1 status post thrombolysis check of left lower extremity occluded bypass.  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  Groin access site looks good.  He has a brisk peroneal signal in the left foot which is much improved.  Plan Eliquis and Plavix which he was previously taking and these have been reordered.  Discussed when out of bed and mobility is adequate we will plan for discharge with follow-up in 1 month.  Hgb 10 and stable.  K 3.4 and replaced with 20 mEq K-Dur.  Cephus Shelling 12/04/2020 9:15 AM --

## 2020-12-04 NOTE — Evaluation (Addendum)
Physical Therapy Evaluation Patient Details Name: Ryan [name] MRN: [id number] DOB: Apr 27, 1950 Today's Date: 12/04/2020  History of Present Illness  70 yo admitted 11/3 s/p thrombolysis of LLE occluded BPG. 11/4 angioplasty with coil embolization and left common iliac artery stent. PMhx: PVD, HTN, fem-pop BPG LLE  Clinical Impression  Pt pleasant and aware of recovery process given multiple surgeries to LLE. Pt with pain limiting ROM and weightbearing tolerance of LLE today. Pt benefits from initial static standing for heel strike prior to gait with reliance on RW. Pt with decreased strength, ROM, transfers and gait who will benefit from acute therapy to maximize mobility, safety and function to decrease burden of care. Educated for AAROM of LLE dorsiflexion, LAQ and walking to speed mobility recovery.   Pt also encouraged for smoking cessation.  VSS     Recommendations for follow up therapy are one component of a multi-disciplinary discharge planning process, led by the attending physician.  Recommendations may be updated based on patient status, additional functional criteria and insurance authorization.  Follow Up Recommendations Home health PT    Assistance Recommended at Discharge Set up Supervision/Assistance  Functional Status Assessment Patient has had a recent decline in their functional status and demonstrates the ability to make significant improvements in function in a reasonable and predictable amount of time.  Equipment Recommendations  None recommended by PT    Recommendations for Other Services       Precautions / Restrictions Precautions Precautions: Fall Restrictions Weight Bearing Restrictions: No LLE Weight Bearing: Non weight bearing      Mobility  Bed Mobility               General bed mobility comments: in chair on arrival and end of session    Transfers Overall transfer level: Needs assistance   Transfers: Sit to/from Stand Sit to Stand: Min  guard           General transfer comment: cues for hand placement    Ambulation/Gait Ambulation/Gait assistance: Min guard Gait Distance (Feet): 18 Feet Assistive device: Rolling walker (2 wheels) Gait Pattern/deviations: Step-to pattern;Decreased stride length;Decreased dorsiflexion - left;Decreased weight shift to left;Decreased stance time - left   Gait velocity interpretation: <1.8 ft/sec, indicate of risk for recurrent falls General Gait Details: cues for sequence, knee and hip extension as well as foot flat on LLe. pt with tendency for toe weight bearing and educated for gait normalization and weight shift with progressive heel down with gait, reliance on RW  Stairs            Wheelchair Mobility    Modified Rankin (Stroke Patients Only)       Balance Overall balance assessment: Needs assistance   Sitting balance-Leahy Scale: Fair     Standing balance support: Bilateral upper extremity supported Standing balance-Leahy Scale: Poor Standing balance comment: reliant on bil UE and RW due to pain in LLE                             Pertinent Vitals/Pain Pain Assessment: Faces Pain Score: 6  Pain Location: LLE calf Pain Descriptors / Indicators: Aching;Guarding;Tender Pain Intervention(s): Limited activity within patient's tolerance;Monitored during session;Repositioned    Home Living Family/patient expects to be discharged to:: Private residence Living Arrangements: Spouse/significant other Available Help at Discharge: Family;Available 24 hours/day Type of Home: House Home Access: Level entry       Home Layout: One level Home Equipment: Agricultural consultant (2 wheels);Cane -  single point;BSC      Prior Function Prior Level of Function : Independent/Modified Independent             Mobility Comments: uses RW for mobility       Hand Dominance        Extremity/Trunk Assessment   Upper Extremity Assessment Upper Extremity Assessment:  Overall WFL for tasks assessed    Lower Extremity Assessment Lower Extremity Assessment: LLE deficits/detail LLE Deficits / Details: decreased strength and ROM limited by pain    Cervical / Trunk Assessment Cervical / Trunk Assessment: Normal  Communication   Communication: No difficulties  Cognition Arousal/Alertness: Awake/alert Behavior During Therapy: WFL for tasks assessed/performed Overall Cognitive Status: Within Functional Limits for tasks assessed                                          General Comments      Exercises General Exercises - Lower Extremity Ankle Circles/Pumps: AAROM;Left;Seated;5 reps Long Arc Quad: AROM;Left;Seated;5 reps   Assessment/Plan    PT Assessment Patient needs continued PT services  PT Problem List Decreased strength;Decreased mobility;Decreased safety awareness;Decreased activity tolerance;Decreased balance;Decreased knowledge of use of DME;Pain       PT Treatment Interventions Gait training;Stair training;Functional mobility training;Therapeutic activities;Patient/family education;Balance training;DME instruction;Therapeutic exercise    PT Goals (Current goals can be found in the Care Plan section)  Acute Rehab PT Goals 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 PT Goal Formulation: With patient Time For Goal Achievement: 12/18/20 Potential to Achieve Goals: Good    Frequency Min 3X/week   Barriers to discharge        Co-evaluation               AM-PAC PT "6 Clicks" Mobility  Outcome Measure Help needed turning from your back to your side while in a flat bed without using bedrails?: None Help needed moving from lying on your back to sitting on the side of a flat bed without using bedrails?: None Help needed moving to and from a bed to a chair (including a wheelchair)?: None Help needed standing up from a chair using your arms (e.g., wheelchair or bedside chair)?: A Little Help needed to walk  in hospital room?: A Little Help needed climbing 3-5 steps with a railing? : A Lot 6 Click Score: 20    End of Session Equipment Utilized During Treatment: Gait belt Activity Tolerance: Patient tolerated treatment well Patient left: in chair;with call bell/phone within reach Nurse Communication: Mobility status PT Visit Diagnosis: Other abnormalities of gait and mobility (R26.89);Difficulty in walking, not elsewhere classified (R26.2);Pain;Muscle weakness (generalized) (M62.81) Pain - Right/Left: Left Pain - part of body: Leg    Time: 2620-3559 PT Time Calculation (min) (ACUTE ONLY): 21 min   Charges:   PT Evaluation $PT Eval Moderate Complexity: 1 Mod          Adelie Croswell P, PT Acute Rehabilitation Services Pager: [phone] Office: [phone]   Enedina Finner Bronson Bressman 12/04/2020, 10:48 AM

## 2020-12-05 NOTE — Discharge Instructions (Signed)
° °  [facility]  ° °Discharge Instructions ° °Lower Extremity Angiogram; Angioplasty/Stenting ° °Please refer to the following instructions for your post-procedure care. Your surgeon or physician assistant will discuss any changes with you. ° °Activity ° °Avoid lifting more than 8 pounds (1 gallons of milk) for 72 hours (3 days) after your procedure. You may walk as much as you can tolerate. It's OK to drive after 72 hours. ° °Bathing/Showering ° °You may shower the day after your procedure. If you have a bandage, you may remove it at 24- 48 hours. Clean your incision site with mild soap and water. Pat the area dry with a clean towel. ° °Diet ° °Resume your pre-procedure diet. There are no special food restrictions following this procedure. All patients with peripheral vascular disease should follow a low fat/low cholesterol diet. In order to heal from your surgery, it is CRITICAL to get adequate nutrition. Your body requires vitamins, minerals, and protein. Vegetables are the best source of vitamins and minerals. Vegetables also provide the perfect balance of protein. Processed food has little nutritional value, so try to avoid this. ° °Medications ° °Resume taking all of your medications unless your doctor tells you not to. If your incision is causing pain, you may take over-the-counter pain relievers such as acetaminophen (Tylenol) ° °Follow Up ° °Follow up will be arranged at the time of your procedure. You may have an office visit scheduled or may be scheduled for surgery. Ask your surgeon if you have any questions. ° °Please call us immediately for any of the following conditions: °•Severe or worsening pain your legs or feet at rest or with walking. °•Increased pain, redness, drainage at your groin puncture site. °•Fever of 101 degrees or higher. °•If you have any mild or slow bleeding from your puncture site: lie down, apply firm constant pressure over the area with a piece of  gauze or a clean wash cloth for 30 minutes- no peeking!, call 911 right away if you are still bleeding after 30 minutes, or if the bleeding is heavy and unmanageable. ° °Reduce your risk factors of vascular disease: ° °Stop smoking. If you would like help call [facility] at 1-800-QUIT-NOW (1-800-784-8669) or [place] at 336-586-4000. °Manage your cholesterol °Maintain a desired weight °Control your diabetes °Keep your blood pressure down ° °If you have any questions, please call the office at [phone] ° °

## 2020-12-05 NOTE — Progress Notes (Signed)
[facility]   Subjective  - left calf feels better.  Left foot feels good.   Objective 127/63 61 98.7 F (37.1 C) (Oral) 18 96%  Intake/Output Summary (Last 24 hours) at 12/05/2020 0818 Last data filed at 12/05/2020 0630 Gross per 24 hour  Intake 50 ml  Output 1445 ml  Net -1395 ml    Right groin access site clean dry and intact with no hematoma Left peroneal signal brisk by Doppler Left foot warm Left calf much soft, no significant pain with palpation  Laboratory Lab Results: Recent Labs    12/03/20 0641 12/04/20 0238  WBC 9.9 12.4*  HGB 10.2* 10.0*  HCT 33.6* 33.1*  PLT 368 312   BMET Recent Labs    12/03/20 1554 12/04/20 0238  NA 136 138  K 3.2* 3.4*  CL 112* 113*  CO2 16* 16*  GLUCOSE 122* 118*  BUN 21 19  CREATININE 1.01 1.07  CALCIUM 8.0* 8.0*    COAG Lab Results  Component Value Date   INR 1.0 06/16/2020   INR 0.97 01/17/2018   INR 0.9 06/16/2008   No results found for: PTT  Assessment/Planning:  Postop day 2 status post thrombolysis check of left lower extremity occluded bypass.  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  Groin access site looks good.  He has a brisk peroneal signal in the left foot which is much improved.  Plan discharge today on Eliquis and Plavix which he was previously taking.  We will order home health PT.  Discussed follow-up in 1 month with left leg arterial duplex and ABIs.  Calf is much softer today no evidence of compartment syndrome.  Cephus Shelling 12/05/2020 8:18 AM --

## 2020-12-05 NOTE — TOC Transition Note (Addendum)
Transition of Care [facility]) - CM/SW Discharge Note   Patient Details  Name: Chares Slaymaker MRN: [id number] Date of Birth: [birth year]  Transition of [facility]) CM/SW Contact:  Bess Kinds, RN Phone Number: [phone] 12/05/2020, 1:03 PM   Clinical Narrative:     Spoke with patient at the bedside to discuss transition home. PTA home with spouse. Demographics and PCP verified.   Stated his wife will pick him up for transportation home.   Discussed recommendations for [facility] PT - patient unsure if he needs this, but is agreeable to follow up call. Offered choice of home health agency. Patient has used [facility]now [place] in the past. Referral pending with [facility].   No further TOC needs identified at this time.   Update 1336: Referral for [facility] PT accepted by [facility].   Final next level of care: Home w Home Health Services Barriers to Discharge: No Barriers Identified   Patient Goals and CMS Choice Patient states their goals for this hospitalization and ongoing recovery are:: return home with wife CMS Medicare.gov Compare Post Acute Care list provided to:: Patient Choice offered to / list presented to : Patient  Discharge Placement                       Discharge Plan and Services                DME Arranged: N/A DME Agency: NA       HH Arranged: PT HH Agency: [facility] Date [facility] Agency Contacted: 12/05/20 Time HH Agency Contacted: 1302 Representative spoke with at [facility] Agency: Amy  Social Determinants of Health (SDOH) Interventions     Readmission Risk Interventions No flowsheet data found.

## 2020-12-05 NOTE — Plan of Care (Signed)
  Problem: Education: Goal: Understanding of CV disease, CV risk reduction, and recovery process will improve Outcome: Completed/Met   Problem: Education: Goal: Knowledge of General Education information will improve Description: Including pain rating scale, medication(s)/side effects and non-pharmacologic comfort measures Outcome: Completed/Met   Problem: Health Behavior/Discharge Planning: Goal: Ability to manage health-related needs will improve Outcome: Completed/Met   Problem: Cardiovascular: Goal: Vascular access site(s) Level 0-1 will be maintained Outcome: Completed/Met   Problem: Safety: Goal: Ability to remain free from injury will improve Outcome: Completed/Met

## 2020-12-06 ENCOUNTER — Encounter (HOSPITAL_COMMUNITY): Payer: Self-pay | Admitting: Vascular Surgery

## 2020-12-06 MED FILL — Clopidogrel Bisulfate Tab 75 MG (Base Equiv): ORAL | Qty: 1 | Status: AC

## 2020-12-06 MED FILL — Cefazolin Sodium-Dextrose IV Solution 2 GM/100ML-4%: INTRAVENOUS | Qty: 100 | Status: AC

## 2020-12-06 NOTE — Discharge Summary (Signed)
Bypass Discharge Summary Patient ID: Ryan [name] [id number] 70 y.o. 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  Arrangements were made for thrombolysis.  The day of admission the patient was taken to the peripheral vascular lab where he underwent aortogram with left lower extremity thrombolysis of occluded bypass graft.  He was observed in the intensive care unit overnight.  He was kept on heparin infusion.  He had a brisk left peroneal as well as posterior tibial Doppler signal.  The following day he was returned to the peripheral vascular lab for lysis recheck.  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  He was taken to the PACU in satisfactory condition.  His hemoglobin remained stable.  He had mild hypokalemia and this was repleted.  His Eliquis and Plavix were resumed and heparin discontinued.  He had mild left calf pain but compartments remained soft.  He had brisk peroneal signal and was ready for discharge home.  Consults:  None  Treatments: procedures: Thrombolysis   Disposition: Discharge disposition: 06-Home-Health Care Svc       - For VQI Registry use ---  Post-op:  Wound infection: No  Graft infection: No  Transfusion: No  If yes, 0 units given New Arrhythmia: No Patency judged by: [ x] Dopper only, [ ]  Palpable graft pulse, [ ]  Palpable distal pulse, [ ]  ABI inc. > 0.15, [ ]  Duplex Discharge ABI: R , L  Discharge TBI: R , L  D/C Ambulatory Status: Ambulatory  Complications: MI: [ ]  No, [ ]  Troponin only, [ ]  EKG or Clinical CHF: No Resp failure: [ ]  none, [ ]  Pneumonia, [ ]  Ventilator Chg in renal function: [ ]  none, [ ]  Inc. Cr > 0.5, [ ]  Temp. Dialysis, [ ]  Permanent dialysis Stroke: [ ]  None, [ ]  Minor, [ ]  Major Return to OR: No  Reason for return to OR: [ ]  Bleeding, [ ]  Infection, [ ]  Thrombosis, [ ]  Revision  Discharge medications: Statin use:  Yes ASA use:  No  for medical reason on apixaban Plavix use:  Yes Beta blocker use: Yes Coumadin use: No  for medical reason on apixaban    Patient Instructions:  Allergies as of 12/05/2020   No Known Allergies      Medication List     STOP taking these medications    gabapentin 100 MG capsule Commonly known as: NEURONTIN   ketoconazole 2 % cream Commonly known as: NIZORAL       TAKE these medications    atenolol 50 MG tablet Commonly known as: TENORMIN Take 50 mg by mouth in the morning.   clopidogrel 75 MG tablet Commonly known as:  PLAVIX TAKE 1 TABLET BY MOUTH EVERY DAY   Eliquis 5 MG Tabs tablet Generic drug: apixaban TAKE 1 TABLET BY MOUTH TWICE A DAY What changed: Another medication with the same name was removed. Continue taking this medication, and follow the directions you see here.   HYDROcodone-acetaminophen 5-325 MG tablet Commonly known as: Norco Take 1 tablet by mouth every 6 (six) hours as needed for moderate pain.   losartan-hydrochlorothiazide 100-25 MG tablet Commonly known as:  HYZAAR Take 1 tablet by mouth in the morning.   rosuvastatin 20 MG tablet Commonly known as: CRESTOR Take 20 mg by mouth at bedtime.       Activity: no lifting, driving, or strenuous exercise for 2 weeks Diet: cardiac diet Wound Care: none needed  Follow-up with Dr. Chestine [name] in 4 weeks.  Signed: Lynnell Jude [name] 12/06/2020 10:03 AM

## 2020-12-07 DIAGNOSIS — Z7901 Long term (current) use of anticoagulants: Secondary | ICD-10-CM | POA: Diagnosis not present

## 2020-12-07 DIAGNOSIS — I998 Other disorder of circulatory system: Secondary | ICD-10-CM | POA: Diagnosis not present

## 2020-12-07 DIAGNOSIS — Z48812 Encounter for surgical aftercare following surgery on the circulatory system: Secondary | ICD-10-CM | POA: Diagnosis not present

## 2020-12-07 DIAGNOSIS — Z720 Tobacco use: Secondary | ICD-10-CM | POA: Diagnosis not present

## 2020-12-07 DIAGNOSIS — I739 Peripheral vascular disease, unspecified: Secondary | ICD-10-CM | POA: Diagnosis not present

## 2020-12-07 DIAGNOSIS — I10 Essential (primary) hypertension: Secondary | ICD-10-CM | POA: Diagnosis not present

## 2020-12-07 DIAGNOSIS — E785 Hyperlipidemia, unspecified: Secondary | ICD-10-CM | POA: Diagnosis not present

## 2020-12-07 DIAGNOSIS — Z7902 Long term (current) use of antithrombotics/antiplatelets: Secondary | ICD-10-CM | POA: Diagnosis not present

## 2020-12-09 DIAGNOSIS — I739 Peripheral vascular disease, unspecified: Secondary | ICD-10-CM | POA: Diagnosis not present

## 2020-12-09 DIAGNOSIS — Z7902 Long term (current) use of antithrombotics/antiplatelets: Secondary | ICD-10-CM | POA: Diagnosis not present

## 2020-12-09 DIAGNOSIS — I998 Other disorder of circulatory system: Secondary | ICD-10-CM | POA: Diagnosis not present

## 2020-12-09 DIAGNOSIS — Z48812 Encounter for surgical aftercare following surgery on the circulatory system: Secondary | ICD-10-CM | POA: Diagnosis not present

## 2020-12-09 DIAGNOSIS — I10 Essential (primary) hypertension: Secondary | ICD-10-CM | POA: Diagnosis not present

## 2020-12-09 DIAGNOSIS — Z7901 Long term (current) use of anticoagulants: Secondary | ICD-10-CM | POA: Diagnosis not present

## 2020-12-14 ENCOUNTER — Telehealth: Payer: Self-pay

## 2020-12-14 DIAGNOSIS — I739 Peripheral vascular disease, unspecified: Secondary | ICD-10-CM | POA: Diagnosis not present

## 2020-12-14 DIAGNOSIS — Z7902 Long term (current) use of antithrombotics/antiplatelets: Secondary | ICD-10-CM | POA: Diagnosis not present

## 2020-12-14 DIAGNOSIS — I998 Other disorder of circulatory system: Secondary | ICD-10-CM | POA: Diagnosis not present

## 2020-12-14 DIAGNOSIS — Z48812 Encounter for surgical aftercare following surgery on the circulatory system: Secondary | ICD-10-CM | POA: Diagnosis not present

## 2020-12-14 DIAGNOSIS — Z7901 Long term (current) use of anticoagulants: Secondary | ICD-10-CM | POA: Diagnosis not present

## 2020-12-14 DIAGNOSIS — I10 Essential (primary) hypertension: Secondary | ICD-10-CM | POA: Diagnosis not present

## 2020-12-14 NOTE — Telephone Encounter (Signed)
Patient calls today to report that he is still having pain in his left calf and down by his ankle. Says it is the same as it was in the hospital and hurts all the time. He has not tried tylenol. Advised patient to try tylenol and back if no improvement or condition worsens/persists. Patient verbalizes understanding.

## 2020-12-15 ENCOUNTER — Other Ambulatory Visit: Payer: Self-pay

## 2020-12-15 DIAGNOSIS — Z48812 Encounter for surgical aftercare following surgery on the circulatory system: Secondary | ICD-10-CM | POA: Diagnosis not present

## 2020-12-15 DIAGNOSIS — I739 Peripheral vascular disease, unspecified: Secondary | ICD-10-CM | POA: Diagnosis not present

## 2020-12-15 DIAGNOSIS — Z7902 Long term (current) use of antithrombotics/antiplatelets: Secondary | ICD-10-CM | POA: Diagnosis not present

## 2020-12-15 DIAGNOSIS — I10 Essential (primary) hypertension: Secondary | ICD-10-CM | POA: Diagnosis not present

## 2020-12-15 DIAGNOSIS — I998 Other disorder of circulatory system: Secondary | ICD-10-CM | POA: Diagnosis not present

## 2020-12-15 DIAGNOSIS — Z7901 Long term (current) use of anticoagulants: Secondary | ICD-10-CM | POA: Diagnosis not present

## 2020-12-16 DIAGNOSIS — Z7902 Long term (current) use of antithrombotics/antiplatelets: Secondary | ICD-10-CM | POA: Diagnosis not present

## 2020-12-16 DIAGNOSIS — Z7901 Long term (current) use of anticoagulants: Secondary | ICD-10-CM | POA: Diagnosis not present

## 2020-12-16 DIAGNOSIS — Z48812 Encounter for surgical aftercare following surgery on the circulatory system: Secondary | ICD-10-CM | POA: Diagnosis not present

## 2020-12-16 DIAGNOSIS — I10 Essential (primary) hypertension: Secondary | ICD-10-CM | POA: Diagnosis not present

## 2020-12-16 DIAGNOSIS — I998 Other disorder of circulatory system: Secondary | ICD-10-CM | POA: Diagnosis not present

## 2020-12-16 DIAGNOSIS — I739 Peripheral vascular disease, unspecified: Secondary | ICD-10-CM | POA: Diagnosis not present

## 2020-12-17 ENCOUNTER — Telehealth: Payer: Self-pay

## 2020-12-17 DIAGNOSIS — I10 Essential (primary) hypertension: Secondary | ICD-10-CM | POA: Diagnosis not present

## 2020-12-17 DIAGNOSIS — Z7902 Long term (current) use of antithrombotics/antiplatelets: Secondary | ICD-10-CM | POA: Diagnosis not present

## 2020-12-17 DIAGNOSIS — Z7901 Long term (current) use of anticoagulants: Secondary | ICD-10-CM | POA: Diagnosis not present

## 2020-12-17 DIAGNOSIS — I998 Other disorder of circulatory system: Secondary | ICD-10-CM | POA: Diagnosis not present

## 2020-12-17 DIAGNOSIS — Z48812 Encounter for surgical aftercare following surgery on the circulatory system: Secondary | ICD-10-CM | POA: Diagnosis not present

## 2020-12-17 DIAGNOSIS — I739 Peripheral vascular disease, unspecified: Secondary | ICD-10-CM | POA: Diagnosis not present

## 2020-12-17 NOTE — Telephone Encounter (Signed)
Kim from [facility] calls today to report that patient is still c/o left leg pain. Does not hurt at rest, but if touched, or ambulatory, pain is 10/10. Extremity is warm, and per RN DP pulse is palpable. Recent thrombectomy and lysis of occluded redo bypass. Placed patient on schedule for evaluation.

## 2020-12-18 NOTE — Progress Notes (Signed)
Office Note   HPI: Ryan [name] is a 70 y.o. 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  Pt has a lengthy surgical history as outlined below.  - left common femoral to below-knee pop bypass with propaten by Dr. Imogene Burn in 2018.  - occluded in 2019 and Clark performed a redo left common femoral to below-knee popliteal bypass with 6 mm ringed PTFE.   - occluded in 2022 another lysis, thrombectomy of bypass and tibial thrombectomy and removal of the stent across the distal anastomosis with a bovine patch on 06/16/2020.   - occluded - see above for most recent intervention  On exam, he was in left leg rest pain. This has been present for the last week, but is now keeping him up at night.  He denies sensory or motor changes.  He has been unable to ambulate due to the pain, currently using a wheelchair, he is able to stand and pivot  The pt is  on a statin for cholesterol management.  The pt is not on a daily aspirin.   Other AC:  plavix, eliquis The pt is  on medication for hypertension.   The pt is not diabetic.  Tobacco hx:  current every day smoker  Past Medical History:  Diagnosis Date   Hypertension    PVD (peripheral vascular disease) (HCC)     Past Surgical History:  Procedure Laterality Date   ABDOMINAL AORTAGRAM Left 12/13/2016   ABDOMINAL AORTOGRAM W/LOWER EXTREMITY/notes 12/13/2016   ABDOMINAL AORTOGRAM W/LOWER EXTREMITY N/A 11/28/2016   Procedure: ABDOMINAL AORTOGRAM W/LOWER EXTREMITY;  Surgeon: Yates Decamp, MD;  Location: [facility] INVASIVE CV [facility];  Service: Cardiovascular;  Laterality: N/A;  bilateral   ABDOMINAL AORTOGRAM W/LOWER EXTREMITY N/A 12/13/2016   Procedure: ABDOMINAL AORTOGRAM W/LOWER EXTREMITY;  Surgeon: Maeola Harman, MD;  Location: [facility] INVASIVE CV LAB;  Service: Cardiovascular;   Laterality: N/A;  unilater left leg   ABDOMINAL AORTOGRAM W/LOWER EXTREMITY N/A 01/09/2018   Procedure: ABDOMINAL AORTOGRAM W/LOWER EXTREMITY;  Surgeon: Cephus Shelling, MD;  Location: [facility];  Service: Cardiovascular;  Laterality: N/A;   ABDOMINAL AORTOGRAM W/LOWER EXTREMITY Left 09/22/2019   Procedure: ABDOMINAL AORTOGRAM W/LOWER EXTREMITY;  Surgeon: Maeola Harman, MD;  Location: [facility] INVASIVE CV LAB;  Service: Cardiovascular;  Laterality: Left;   ABDOMINAL AORTOGRAM W/LOWER EXTREMITY Left 12/02/2020   Procedure: ABDOMINAL AORTOGRAM W/LOWER EXTREMITY;  Surgeon: Cephus Shelling, MD;  Location: [facility];  Service: Cardiovascular;  Laterality: Left;   EMBOLIZATION Left 12/03/2020   Procedure: EMBOLIZATION;  Surgeon: Leonie Douglas, MD;  Location: [facility];  Service: Cardiovascular;  Laterality: Left;   FEMORAL-POPLITEAL BYPASS GRAFT Left 12/14/2016   Procedure: BYPASS GRAFT COMMON FEMORAL- BELOW KNEE POPLITEAL ARTERY with Bovine Patch to Below the knee poplitieal artery.;  Surgeon: Fransisco Hertz, MD;  Location: [facility] OR;  Service: Vascular;  Laterality: Left;   FEMORAL-POPLITEAL BYPASS GRAFT Left 01/28/2018   Procedure: BYPASS GRAFT FEMORAL-BELOW KNEE POPLITEAL ARTERY REDO with propaten vascular graft removable ring;  Surgeon: Cephus Shelling, MD;  Location: [facility] OR;  Service: Vascular;  Laterality: Left;   FEMUR SURGERY Right 2009   pt. has a rod in that leg   LOWER EXTREMITY ANGIOGRAPHY Left 11/28/2016   Procedure: Lower Extremity Angiography;  Surgeon: Yates Decamp, MD;  Location: [facility] INVASIVE CV LAB;  Service: Cardiovascular;  Laterality:  Left;  lysis followup lower leg   PATCH ANGIOPLASTY Left 01/28/2018   Procedure: PATCH ANGIOPLASTY USING Livia Snellen BIOLOGIC PATCH;  Surgeon: Cephus Shelling, MD;  Location: [facility] OR;  Service: Vascular;  Laterality: Left;   PATCH ANGIOPLASTY Left 06/16/2020   Procedure: PATCH ANGIOPLASTY OF LEFT BELOW KNEE  POPITEAL ARTERY;  Surgeon: Cephus Shelling, MD;  Location: [facility] OR;  Service: Vascular;  Laterality: Left;   PERIPHERAL VASCULAR BALLOON ANGIOPLASTY  11/28/2016   Procedure: PERIPHERAL VASCULAR BALLOON ANGIOPLASTY;  Surgeon: Yates Decamp, MD;  Location: [facility];  Service: Cardiovascular;;  SFA   PERIPHERAL VASCULAR BALLOON ANGIOPLASTY  12/03/2020   Procedure: PERIPHERAL VASCULAR BALLOON ANGIOPLASTY;  Surgeon: Leonie Douglas, MD;  Location: [facility];  Service: Cardiovascular;;  TP Trunk and Peroneal   PERIPHERAL VASCULAR CATHETERIZATION N/A 08/11/2014   Procedure: Lower Extremity Angiography;  Surgeon: Yates Decamp, MD;  Location[facility] INVASIVE CV LAB;  Service: Cardiovascular;  Laterality: N/A;   PERIPHERAL VASCULAR INTERVENTION Left 09/22/2019   Procedure: PERIPHERAL VASCULAR INTERVENTION;  Surgeon: Maeola Harman, MD;  Location: [facility] INVASIVE CV LAB;  Service: Cardiovascular;  Laterality: Left;   PERIPHERAL VASCULAR INTERVENTION Left 09/23/2019   Procedure: PERIPHERAL VASCULAR INTERVENTION;  Surgeon: Nada Libman, MD;  Location: [facility];  Service: Cardiovascular;  Laterality: Left;   PERIPHERAL VASCULAR INTERVENTION Left 05/13/2020   Procedure: PERIPHERAL VASCULAR INTERVENTION;  Surgeon: Cephus Shelling, MD;  Location: [facility];  Service: Cardiovascular;  Laterality: Left;   PERIPHERAL VASCULAR INTERVENTION Left 12/02/2020   Procedure: PERIPHERAL VASCULAR INTERVENTION;  Surgeon: Cephus Shelling, MD;  Location: [facility];  Service: Cardiovascular;  Laterality: Left;   PERIPHERAL VASCULAR INTERVENTION  12/03/2020   Procedure: PERIPHERAL VASCULAR INTERVENTION;  Surgeon: Leonie Douglas, MD;  Location: [facility];  Service: Cardiovascular;;   PERIPHERAL VASCULAR THROMBECTOMY  11/28/2016   Procedure: PERIPHERAL VASCULAR THROMBECTOMY;  Surgeon: Yates Decamp, MD;  Location: [facility];  Service: Cardiovascular;;  Profunda    PERIPHERAL VASCULAR THROMBECTOMY  11/28/2016   Procedure: PERIPHERAL VASCULAR THROMBECTOMY;  Surgeon: Yates Decamp, MD;  Location: [facility];  Service: Cardiovascular;;   PERIPHERAL VASCULAR THROMBECTOMY N/A 05/12/2020   Procedure: PERIPHERAL VASCULAR THROMBECTOMY-Left Leg;  Surgeon: Leonie Douglas, MD;  Location: [facility];  Service: Cardiovascular;  Laterality: N/A;   PERIPHERAL VASCULAR THROMBECTOMY N/A 05/13/2020   Procedure: PERIPHERAL VASCULAR THROMBECTOMY- LYSIS RECHECK;  Surgeon: Cephus Shelling, MD;  Location: [facility];  Service: Cardiovascular;  Laterality: N/A;   PERIPHERAL VASCULAR THROMBECTOMY N/A 12/02/2020   Procedure: PERIPHERAL VASCULAR THROMBECTOMY;  Surgeon: Cephus Shelling, MD;  Location: [facility];  Service: Cardiovascular;  Laterality: N/A;   PERIPHERAL VASCULAR THROMBECTOMY N/A 12/03/2020   Procedure: LYSIS RECHECK;  Surgeon: Leonie Douglas, MD;  Location: [facility];  Service: Cardiovascular;  Laterality: N/A;   PULMONARY THROMBECTOMY N/A 09/23/2019   Procedure: LYSIS RECHECK;  Surgeon: Nada Libman, MD;  Location: [facility];  Service: Cardiovascular;  Laterality: N/A;   THROMBECTOMY FEMORAL ARTERY Left 12/14/2016   Procedure: THROMBECTOMY PROFUNDA FEMORAL ARTERY;  Surgeon: Fransisco Hertz, MD;  Location: [facility] OR;  Service: Vascular;  Laterality: Left;   THROMBECTOMY FEMORAL ARTERY Left 06/16/2020   Procedure: Redo exposure ot Left below knee popiteal artery;  Surgeon: Cephus Shelling, MD;  Location: [facility] OR;  Service: Vascular;  Laterality: Left;   THROMBECTOMY OF BYPASS  GRAFT FEMORAL- POPLITEAL ARTERY Left 06/16/2020   Procedure: THROMBECTOMY OF BYPASS GRAFT FEMORAL-POPLITEAL ARTERY and TIBIAL ARTERY;  Surgeon: Cephus Shelling, MD;  Location: [facility] OR;  Service: Vascular;  Laterality: Left;    Social History   Socioeconomic History   Marital status: Married    Spouse name: Not on file   Number of  children: Not on file   Years of education: Not on file   Highest education level: Not on file  Occupational History   Occupation: retired  Tobacco Use   Smoking status: Every Day    Packs/day: 0.75    Years: 56.00    Pack years: 42.00    Types: Cigarettes   Smokeless tobacco: Never  Vaping Use   Vaping Use: Never used  Substance and Sexual Activity   Alcohol use: Yes    Alcohol/week: 10.0 standard drinks    Types: 10 Shots of liquor per week    Comment:  01/29/2018 "1-2 shots per night"   Drug use: No   Sexual activity: Not on file  Other Topics Concern   Not on file  Social History Narrative   Not on file   Social Determinants of Health   Financial Resource Strain: Not on file  Food Insecurity: Not on file  Transportation Needs: Not on file  Physical Activity: Not on file  Stress: Not on file  Social Connections: Not on file  Intimate Partner Violence: Not on file    Family History  Problem Relation Age of Onset   Hypertension Mother    Diabetes Mother    Hypertension Father     Current Outpatient Medications  Medication Sig Dispense Refill   atenolol (TENORMIN) 50 MG tablet Take 50 mg by mouth in the morning.     clopidogrel (PLAVIX) 75 MG tablet TAKE 1 TABLET BY MOUTH EVERY DAY (Patient not taking: Reported on 11/30/2020) 90 tablet 3   ELIQUIS 5 MG TABS tablet TAKE 1 TABLET BY MOUTH TWICE A DAY (Patient not taking: Reported on 11/30/2020) 60 tablet 0   HYDROcodone-acetaminophen (NORCO) 5-325 MG tablet Take 1 tablet by mouth every 6 (six) hours as needed for moderate pain. 30 tablet 0   losartan-hydrochlorothiazide (HYZAAR) 100-25 MG tablet Take 1 tablet by mouth in the morning.     rosuvastatin (CRESTOR) 20 MG tablet Take 20 mg by mouth at bedtime.     No current facility-administered medications for this visit.    No Known Allergies   REVIEW OF SYSTEMS:   [X]  denotes positive finding, [ ]  denotes negative finding Cardiac  Comments:  Chest pain or  chest pressure:    Shortness of breath upon exertion:    Short of breath when lying flat:    Irregular heart rhythm:        Vascular    Pain in calf, thigh, or hip brought on by ambulation:    Pain in feet at night that wakes you up from your sleep:     Blood clot in your veins:    Leg swelling:         Pulmonary    Oxygen at home:    Productive cough:     Wheezing:         Neurologic    Sudden weakness in arms or legs:     Sudden numbness in arms or legs:     Sudden onset of difficulty speaking or slurred speech:    Temporary loss of vision in one eye:  Problems with dizziness:         Gastrointestinal    Blood in stool:     Vomited blood:         Genitourinary    Burning when urinating:     Blood in urine:        Psychiatric    Major depression:         Hematologic    Bleeding problems:    Problems with blood clotting too easily:        Skin    Rashes or ulcers:        Constitutional    Fever or chills:      PHYSICAL EXAMINATION:  There were no vitals filed for this visit.  General:  WDWN in NAD; vital signs documented above Gait: Not observed HENT: WNL, normocephalic Pulmonary: normal non-labored breathing , without wheezing Cardiac: regular HR,  Abdomen: soft, NT, no masses Skin: without rashes Vascular Exam/Pulses:  Right Left  Radial 2+ (normal) 2+ (normal)  Ulnar 2+ (normal) 2+ (normal)  Femoral    Popliteal    DP 2+ (normal) 2+ (normal)  PT 2+ (normal) 2+ (normal)   Extremities: without ischemic changes, without Gangrene , without cellulitis; without open wounds;  Musculoskeletal: no muscle wasting or atrophy  Neurologic: A&O X 3;  No focal weakness or paresthesias are detected Psychiatric:  The pt has Normal affect.   Non-Invasive Vascular Imaging:   Duplex ultrasonography of the previous bypass graft demonstrated occlusion Severe perfusion deficit in the left lower extremity demonstrated on ABI    ASSESSMENT/PLAN: Ryan [name]  is a 70 y.o. male presenting with occluded bypass.  Patient is currently in rest pain.  This bypass has been reopened several times.  I do not think there is utility in performing the same procedure, as the most recent procedure lasted 2 weeks.  Patient would benefit from new bypass.  Previous imaging demonstrated patent peroneal artery. After discussing the risks and benefits of diagnostic angiography of the left leg in an effort to define a distal target, Ryan [name] to proceed.  He is aware that with every intervention, maintaining perfusion to the left leg becomes more and more difficult.  If there is no target appreciated on angiogram, the only option will be below-knee amputation.  He was asked to continue his current medication regimen, holding the Eliquis starting tonight for intervention Wednesday.  Pending the results, we will discuss bypass versus amputation.   Victorino Sparrow, MD [facility] [phone]

## 2020-12-20 ENCOUNTER — Ambulatory Visit (INDEPENDENT_AMBULATORY_CARE_PROVIDER_SITE_OTHER): Payer: Medicare Other | Admitting: Vascular Surgery

## 2020-12-20 ENCOUNTER — Ambulatory Visit (INDEPENDENT_AMBULATORY_CARE_PROVIDER_SITE_OTHER)
Admission: RE | Admit: 2020-12-20 | Discharge: 2020-12-20 | Disposition: A | Discharge status: Home / Self Care | Payer: Medicare Other | Source: Ambulatory Visit | Attending: Vascular Surgery | Admitting: Vascular Surgery

## 2020-12-20 ENCOUNTER — Other Ambulatory Visit: Payer: Self-pay | Admitting: Physician Assistant

## 2020-12-20 ENCOUNTER — Other Ambulatory Visit: Payer: Self-pay

## 2020-12-20 ENCOUNTER — Encounter: Payer: Self-pay | Admitting: Vascular Surgery

## 2020-12-20 ENCOUNTER — Ambulatory Visit (HOSPITAL_COMMUNITY)
Admission: RE | Admit: 2020-12-20 | Discharge: 2020-12-20 | Disposition: A | Discharge status: Home / Self Care | Payer: Medicare Other | Source: Ambulatory Visit | Attending: Vascular Surgery | Admitting: Vascular Surgery

## 2020-12-20 ENCOUNTER — Other Ambulatory Visit: Payer: Self-pay | Admitting: *Deleted

## 2020-12-20 ENCOUNTER — Ambulatory Visit: Payer: Medicare Other

## 2020-12-20 VITALS — BP 113/72 | HR 54 | Temp 97.9°F | Resp 16 | Ht 66.0 in | Wt 140.0 lb

## 2020-12-20 DIAGNOSIS — T82898A Other specified complication of vascular prosthetic devices, implants and grafts, initial encounter: Secondary | ICD-10-CM | POA: Diagnosis not present

## 2020-12-20 DIAGNOSIS — I739 Peripheral vascular disease, unspecified: Secondary | ICD-10-CM | POA: Insufficient documentation

## 2020-12-20 MED ORDER — HYDROCODONE-ACETAMINOPHEN 5-325 MG PO TABS
1.0000 | ORAL_TABLET | Freq: Four times a day (QID) | ORAL | 0 refills | Status: DC | PRN
Start: 2020-12-20 — End: 2020-12-22

## 2020-12-21 ENCOUNTER — Telehealth: Payer: Self-pay

## 2020-12-21 NOTE — Telephone Encounter (Signed)
Patient's wife contacted office to verify whether patient should be taking clopidogrel 75 mg daily because she was told otherwise by the pharmacy. Reviewed discharge summary from 12/05/20 and office visit from yesterday- no recommendation to discontinue medication by provider. Wife verbalized understanding.

## 2020-12-22 ENCOUNTER — Ambulatory Visit (HOSPITAL_COMMUNITY)
Admission: RE | Admit: 2020-12-22 | Discharge: 2020-12-22 | Disposition: A | Discharge status: Home / Self Care | Payer: Medicare Other | Source: Ambulatory Visit | Attending: Vascular Surgery | Admitting: Vascular Surgery

## 2020-12-22 ENCOUNTER — Encounter (HOSPITAL_COMMUNITY)
Admission: RE | Disposition: A | Discharge status: Home / Self Care | Payer: Self-pay | Source: Ambulatory Visit | Attending: Vascular Surgery

## 2020-12-22 DIAGNOSIS — Z7901 Long term (current) use of anticoagulants: Secondary | ICD-10-CM | POA: Diagnosis not present

## 2020-12-22 DIAGNOSIS — Z7902 Long term (current) use of antithrombotics/antiplatelets: Secondary | ICD-10-CM | POA: Insufficient documentation

## 2020-12-22 DIAGNOSIS — F17210 Nicotine dependence, cigarettes, uncomplicated: Secondary | ICD-10-CM | POA: Diagnosis not present

## 2020-12-22 DIAGNOSIS — I10 Essential (primary) hypertension: Secondary | ICD-10-CM | POA: Insufficient documentation

## 2020-12-22 DIAGNOSIS — I70222 Atherosclerosis of native arteries of extremities with rest pain, left leg: Secondary | ICD-10-CM | POA: Insufficient documentation

## 2020-12-22 DIAGNOSIS — T82338A Leakage of other vascular grafts, initial encounter: Secondary | ICD-10-CM | POA: Diagnosis not present

## 2020-12-22 DIAGNOSIS — I739 Peripheral vascular disease, unspecified: Secondary | ICD-10-CM

## 2020-12-22 DIAGNOSIS — T82868A Thrombosis of vascular prosthetic devices, implants and grafts, initial encounter: Secondary | ICD-10-CM | POA: Diagnosis not present

## 2020-12-22 HISTORY — PX: ABDOMINAL AORTOGRAM W/LOWER EXTREMITY: CATH118223

## 2020-12-22 HISTORY — PX: PERIPHERAL VASCULAR INTERVENTION: CATH118257

## 2020-12-22 LAB — POCT I-STAT, CHEM 8
BUN: 20 mg/dL (ref 8–23)
Calcium, Ion: 1.35 mmol/L (ref 1.15–1.40)
Chloride: 100 mmol/L (ref 98–111)
Creatinine, Ser: 1.4 mg/dL — ABNORMAL HIGH (ref 0.61–1.24)
Glucose, Bld: 136 mg/dL — ABNORMAL HIGH (ref 70–99)
HCT: 36 % — ABNORMAL LOW (ref 39.0–52.0)
Hemoglobin: 12.2 g/dL — ABNORMAL LOW (ref 13.0–17.0)
Potassium: 2.7 mmol/L — CL (ref 3.5–5.1)
Sodium: 138 mmol/L (ref 135–145)
TCO2: 26 mmol/L (ref 22–32)

## 2020-12-22 SURGERY — ABDOMINAL AORTOGRAM W/LOWER EXTREMITY
Anesthesia: LOCAL

## 2020-12-22 MED ORDER — FENTANYL CITRATE (PF) 100 MCG/2ML IJ SOLN
INTRAMUSCULAR | Status: DC | PRN
  Administered 2020-12-22: 50 ug via INTRAVENOUS

## 2020-12-22 MED ORDER — LABETALOL HCL 5 MG/ML IV SOLN: 10.0000 mg | INTRAVENOUS | Status: DC | PRN

## 2020-12-22 MED ORDER — HEPARIN SODIUM (PORCINE) 1000 UNIT/ML IJ SOLN
INTRAMUSCULAR | Status: AC
  Filled 2020-12-22: qty 10

## 2020-12-22 MED ORDER — HEPARIN (PORCINE) IN NACL 1000-0.9 UT/500ML-% IV SOLN
INTRAVENOUS | Status: DC | PRN
  Administered 2020-12-22 (×2): 500 mL

## 2020-12-22 MED ORDER — HYDROCODONE-ACETAMINOPHEN 5-325 MG PO TABS: 1.0000 | ORAL_TABLET | Freq: Four times a day (QID) | ORAL | 0 refills | Status: DC | PRN

## 2020-12-22 MED ORDER — SODIUM CHLORIDE 0.9 % WEIGHT BASED INFUSION: 1.0000 mL/kg/h | INTRAVENOUS | Status: DC

## 2020-12-22 MED ORDER — MIDAZOLAM HCL 2 MG/2ML IJ SOLN
INTRAMUSCULAR | Status: DC | PRN
  Administered 2020-12-22: 1 mg via INTRAVENOUS

## 2020-12-22 MED ORDER — ACETAMINOPHEN 325 MG PO TABS: 650.0000 mg | ORAL_TABLET | ORAL | Status: DC | PRN

## 2020-12-22 MED ORDER — MIDAZOLAM HCL 2 MG/2ML IJ SOLN
INTRAMUSCULAR | Status: AC
  Filled 2020-12-22: qty 2

## 2020-12-22 MED ORDER — LIDOCAINE HCL (PF) 1 % IJ SOLN
INTRAMUSCULAR | Status: AC
  Filled 2020-12-22: qty 30

## 2020-12-22 MED ORDER — HEPARIN (PORCINE) IN NACL 1000-0.9 UT/500ML-% IV SOLN
INTRAVENOUS | Status: AC
  Filled 2020-12-22: qty 500

## 2020-12-22 MED ORDER — ONDANSETRON HCL 4 MG/2ML IJ SOLN: 4.0000 mg | Freq: Four times a day (QID) | INTRAMUSCULAR | Status: DC | PRN

## 2020-12-22 MED ORDER — POTASSIUM CHLORIDE CRYS ER 20 MEQ PO TBCR
40.0000 meq | EXTENDED_RELEASE_TABLET | Freq: Once | ORAL | Status: AC
  Administered 2020-12-22: 40 meq via ORAL
  Filled 2020-12-22: qty 2

## 2020-12-22 MED ORDER — SODIUM CHLORIDE 0.9% FLUSH: 3.0000 mL | Freq: Two times a day (BID) | INTRAVENOUS | Status: DC

## 2020-12-22 MED ORDER — HYDRALAZINE HCL 20 MG/ML IJ SOLN: 5.0000 mg | INTRAMUSCULAR | Status: DC | PRN

## 2020-12-22 MED ORDER — APIXABAN 5 MG PO TABS: ORAL_TABLET | ORAL | 0 refills | Status: DC

## 2020-12-22 MED ORDER — FENTANYL CITRATE (PF) 100 MCG/2ML IJ SOLN
INTRAMUSCULAR | Status: AC
  Filled 2020-12-22: qty 2

## 2020-12-22 MED ORDER — LIDOCAINE HCL (PF) 1 % IJ SOLN
INTRAMUSCULAR | Status: DC | PRN
  Administered 2020-12-22: 10 mL via INTRADERMAL

## 2020-12-22 MED ORDER — HEPARIN SODIUM (PORCINE) 1000 UNIT/ML IJ SOLN
INTRAMUSCULAR | Status: DC | PRN
  Administered 2020-12-22: 5000 [IU] via INTRAVENOUS

## 2020-12-22 MED ORDER — SODIUM CHLORIDE 0.9 % IV SOLN: INTRAVENOUS | Status: DC

## 2020-12-22 MED ORDER — SODIUM CHLORIDE 0.9% FLUSH: 3.0000 mL | INTRAVENOUS | Status: DC | PRN

## 2020-12-22 MED ORDER — SODIUM CHLORIDE 0.9 % IV SOLN: 250.0000 mL | INTRAVENOUS | Status: DC | PRN

## 2020-12-22 SURGICAL SUPPLY — 15 items
BALLN MUSTANG 8.0X40 75 (BALLOONS) ×3
BALLOON MUSTANG 8.0X40 75 (BALLOONS) ×2 IMPLANT
CATH OMNI FLUSH 5F 65CM (CATHETERS) ×3 IMPLANT
DEVICE CLOSURE MYNXGRIP 5F (Vascular Products) IMPLANT
DEVICE CLOSURE MYNXGRIP 6/7F (Vascular Products) ×3 IMPLANT
KIT ENCORE 26 ADVANTAGE (KITS) ×3 IMPLANT
KIT MICROPUNCTURE NIT STIFF (SHEATH) ×3 IMPLANT
KIT PV (KITS) ×3 IMPLANT
SHEATH PINNACLE 5F 10CM (SHEATH) ×3 IMPLANT
SHEATH PINNACLE ST 6F 45CM (SHEATH) ×3 IMPLANT
SHEATH PROBE COVER 6X72 (BAG) ×3 IMPLANT
SYR MEDRAD MARK V 150ML (SYRINGE) ×3 IMPLANT
TRANSDUCER W/STOPCOCK (MISCELLANEOUS) ×3 IMPLANT
TRAY PV CATH (CUSTOM PROCEDURE TRAY) ×3 IMPLANT
WIRE BENTSON .035X145CM (WIRE) ×3 IMPLANT

## 2020-12-22 NOTE — Op Note (Signed)
Patient name: Ryan [name] MRN: [id number] DOB: 1950/09/17 Sex: male  12/22/2020 Pre-operative Diagnosis: [place] 4 critical limb ischemia Post-operative diagnosis:  Same Surgeon:  Ryan Sparrow, MD Procedure Performed: 1.  Ultrasound-guided micropuncture access of the right common femoral artery 2.  Aortogram 3.  Left lower extremity angiogram  4.  Left common iliac balloon angioplasty using an 8 x 40 mm balloon 5.  Device assisted closure-Mynx 6.  Moderate sedation 34 minutes    Indications: Patient is a 70 year old male with history of multiple left lower extremity bypasses, most recently redo femoral to below-knee popliteal artery bypass.  Patient presented 2 weeks ago with new onset left leg rest pain and the bypass was found to be thrombosed.  The bypass was reopened with pharmacomechanical thrombectomy.  Unfortunately, presented back to the office 2 weeks later with rest pain, and ultrasound imaging demonstrating occluded bypass.  After discussing the risks and benefits of repeat angiography in an effort to define a lower extremity bypass target, Ryan [name] elected proceed  Findings:  Aortogram: Normal bilateral renal arteries, normal infrarenal abdominal aorta, right common iliac artery, external iliac artery without flow-limiting stenosis.  Right hypogastric artery patent.  On the left, previously placed stent to exclude iliac artery aneurysm.  There was a 1a endoleak with contrast filling the aneurysm sac.  Distally, the external iliac artery was normal, left internal iliac artery previously coiled.  Now thrombosed.  Left: No flow-limiting stenosis in the left common femoral artery, dilation and previous proximal anastomosis site.  Normal profunda.  Previous bypass occluded.  There is reconstitution of the tibioperoneal trunk through collaterals.  The peroneal provides single-vessel runoff to the foot with medial lateral perforators filling the distal posterior tibial artery, and  dorsalis pedis arteries.  The posterior tibial artery to the ankle was patent filling plantar arteries.  There was sluggish flow in the dorsalis pedis artery.   Procedure:  The patient was identified in the holding area and taken to room 8.  The patient was then placed supine on the table and prepped and draped in the usual sterile fashion.  A time out was called.  Ultrasound was used to evaluate the right common femoral artery.  It was patent .  A digital ultrasound image was acquired.  A micropuncture needle was used to access the right common femoral artery under ultrasound guidance.  An 018 wire was advanced without resistance and a micropuncture sheath was placed.  The 018 wire was removed and a benson wire was placed.  The micropuncture sheath was exchanged for a 5 french sheath.  An omniflush catheter was advanced over the wire to the level of L-1.  An abdominal angiogram was obtained.  Next, using the omniflush catheter and a benson wire, the aortic bifurcation was crossed and the catheter was placed into theleft external iliac artery and left runoff was obtained.  Findings are above.  I made the decision to intervene on the 1 endoleak present in the left common iliac artery.  An 8 x 40 balloon was brought to the field and dilated across the proximal aspect of the previously placed stent.  Follow-up angiography demonstrated an excellent result with resolution of flow into the aneurysm sac.  Impression: Single-vessel runoff to the foot-peroneal with medial lateral perforators filling the dorsalis pedis and posterior tibial artery.  Exclusion of the iliac artery aneurysm with common iliac artery balloon angioplasty.  After the case had a long discussion with Ryan [name] regarding his options for surgery.  We discussed the possibility of peroneal bypass. His wife is [name], and he aligns with her believes, however is not Jehovah's Witness himself.  He asked that what ever surgery be performed, he  does not receive any red blood cells.  He stated he would not have a problem receiving other times of blood product.  I told him I did not feel comfortable performing vascular surgery without having the ability to give red blood cells as a emergencies can happen.  I told him should he choose to except blood, I would be happy to do his case Friday, otherwise I respect his decision, and I would set him up for visit with Dr. Chestine [name], to discuss his options.    Ryan Olden, MD [facility] [place] Office: [phone]

## 2020-12-22 NOTE — H&P (Signed)
Office Note   Patient seen and examined in preop holding.  No complaints. No changes to medication history or physical exam since last seen in clinic. After discussing the risks and benefits of left leg angiogram, [name] elected to proceed.   Victorino Sparrow MD   HPI: Ryan [name] is a 70 y.o. 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  Pt has a lengthy surgical history as outlined below.  - left common femoral to below-knee pop bypass with propaten by Dr. Imogene Burn in 2018.  - occluded in 2019 and Clark performed a redo left common femoral to below-knee popliteal bypass with 6 mm ringed PTFE.   - occluded in 2022 another lysis, thrombectomy of bypass and tibial thrombectomy and removal of the stent across the distal anastomosis with a bovine patch on 06/16/2020.   - occluded - see above for most recent intervention  On exam, he was in left leg rest pain. This has been present for the last week, but is now keeping him up at night.  He denies sensory or motor changes.  He has been unable to ambulate due to the pain, currently using a wheelchair, he is able to stand and pivot  The pt is  on a statin for cholesterol management.  The pt is not on a daily aspirin.   Other AC:  plavix, eliquis The pt is  on medication for hypertension.   The pt is not diabetic.  Tobacco hx:  current every day smoker  Past Medical History:  Diagnosis Date   Hypertension    PVD (peripheral vascular disease) (HCC)     Past Surgical History:  Procedure Laterality Date   ABDOMINAL AORTAGRAM Left 12/13/2016   ABDOMINAL AORTOGRAM W/LOWER EXTREMITY/notes 12/13/2016   ABDOMINAL AORTOGRAM W/LOWER EXTREMITY N/A 11/28/2016   Procedure: ABDOMINAL AORTOGRAM W/LOWER EXTREMITY;  Surgeon: Yates Decamp, MD;  Location: [facility];   Service: Cardiovascular;  Laterality: N/A;  bilateral   ABDOMINAL AORTOGRAM W/LOWER EXTREMITY N/A 12/13/2016   Procedure: ABDOMINAL AORTOGRAM W/LOWER EXTREMITY;  Surgeon: Maeola Harman, MD;  Location: [facility] INVASIVE CV LAB;  Service: Cardiovascular;  Laterality: N/A;  unilater left leg   ABDOMINAL AORTOGRAM W/LOWER EXTREMITY N/A 01/09/2018   Procedure: ABDOMINAL AORTOGRAM W/LOWER EXTREMITY;  Surgeon: Cephus Shelling, MD;  Location: [facility];  Service: Cardiovascular;  Laterality: N/A;   ABDOMINAL AORTOGRAM W/LOWER EXTREMITY Left 09/22/2019   Procedure: ABDOMINAL AORTOGRAM W/LOWER EXTREMITY;  Surgeon: Maeola Harman, MD;  Location: [facility] INVASIVE CV LAB;  Service: Cardiovascular;  Laterality: Left;   ABDOMINAL AORTOGRAM W/LOWER EXTREMITY Left 12/02/2020   Procedure: ABDOMINAL AORTOGRAM W/LOWER EXTREMITY;  Surgeon: Cephus Shelling, MD;  Location: [facility];  Service: Cardiovascular;  Laterality: Left;   EMBOLIZATION Left 12/03/2020   Procedure: EMBOLIZATION;  Surgeon: Leonie Douglas, MD;  Location: [facility];  Service: Cardiovascular;  Laterality: Left;   FEMORAL-POPLITEAL BYPASS GRAFT Left 12/14/2016   Procedure: BYPASS GRAFT COMMON FEMORAL- BELOW KNEE POPLITEAL ARTERY with Bovine Patch to Below the knee poplitieal artery.;  Surgeon: Fransisco Hertz, MD;  Location: [facility] OR;  Service: Vascular;  Laterality: Left;   FEMORAL-POPLITEAL BYPASS GRAFT Left 01/28/2018   Procedure: BYPASS GRAFT FEMORAL-BELOW KNEE POPLITEAL ARTERY REDO with propaten vascular graft removable ring;  Surgeon: Cephus Shelling, MD;  Location: [facility] OR;  Service: Vascular;  Laterality: Left;   FEMUR SURGERY Right 2009   pt. has a rod in that leg   LOWER EXTREMITY ANGIOGRAPHY Left 11/28/2016   Procedure: Lower Extremity Angiography;  Surgeon: Yates Decamp, MD;  Location: [facility] INVASIVE CV LAB;  Service: Cardiovascular;  Laterality: Left;  lysis followup lower leg   PATCH ANGIOPLASTY  Left 01/28/2018   Procedure: PATCH ANGIOPLASTY USING Livia Snellen BIOLOGIC PATCH;  Surgeon: Cephus Shelling, MD;  Location: [facility] OR;  Service: Vascular;  Laterality: Left;   PATCH ANGIOPLASTY Left 06/16/2020   Procedure: PATCH ANGIOPLASTY OF LEFT BELOW KNEE POPITEAL ARTERY;  Surgeon: Cephus Shelling, MD;  Location: [facility] OR;  Service: Vascular;  Laterality: Left;   PERIPHERAL VASCULAR BALLOON ANGIOPLASTY  11/28/2016   Procedure: PERIPHERAL VASCULAR BALLOON ANGIOPLASTY;  Surgeon: Yates Decamp, MD;  Location: [facility];  Service: Cardiovascular;;  SFA   PERIPHERAL VASCULAR BALLOON ANGIOPLASTY  12/03/2020   Procedure: PERIPHERAL VASCULAR BALLOON ANGIOPLASTY;  Surgeon: Leonie Douglas, MD;  Location: [facility];  Service: Cardiovascular;;  TP Trunk and Peroneal   PERIPHERAL VASCULAR CATHETERIZATION N/A 08/11/2014   Procedure: Lower Extremity Angiography;  Surgeon: Yates Decamp, MD;  Location: [facility] INVASIVE CV LAB;  Service: Cardiovascular;  Laterality: N/A;   PERIPHERAL VASCULAR INTERVENTION Left 09/22/2019   Procedure: PERIPHERAL VASCULAR INTERVENTION;  Surgeon: Maeola Harman, MD;  Location: [facility] INVASIVE CV LAB;  Service: Cardiovascular;  Laterality: Left;   PERIPHERAL VASCULAR INTERVENTION Left 09/23/2019   Procedure: PERIPHERAL VASCULAR INTERVENTION;  Surgeon: Nada Libman, MD;  Location: [facility];  Service: Cardiovascular;  Laterality: Left;   PERIPHERAL VASCULAR INTERVENTION Left 05/13/2020   Procedure: PERIPHERAL VASCULAR INTERVENTION;  Surgeon: Cephus Shelling, MD;  Location: [facility];  Service: Cardiovascular;  Laterality: Left;   PERIPHERAL VASCULAR INTERVENTION Left 12/02/2020   Procedure: PERIPHERAL VASCULAR INTERVENTION;  Surgeon: Cephus Shelling, MD;  Location: [facility];  Service: Cardiovascular;  Laterality: Left;   PERIPHERAL VASCULAR INTERVENTION  12/03/2020   Procedure: PERIPHERAL VASCULAR INTERVENTION;  Surgeon: Leonie Douglas, MD;  Location: [facility];  Service: Cardiovascular;;   PERIPHERAL VASCULAR THROMBECTOMY  11/28/2016   Procedure: PERIPHERAL VASCULAR THROMBECTOMY;  Surgeon: Yates Decamp, MD;  Location: [facility];  Service: Cardiovascular;;  Profunda   PERIPHERAL VASCULAR THROMBECTOMY  11/28/2016   Procedure: PERIPHERAL VASCULAR THROMBECTOMY;  Surgeon: Yates Decamp, MD;  Location: [facility];  Service: Cardiovascular;;   PERIPHERAL VASCULAR THROMBECTOMY N/A 05/12/2020   Procedure: PERIPHERAL VASCULAR THROMBECTOMY-Left Leg;  Surgeon: Leonie Douglas, MD;  Location: [facility];  Service: Cardiovascular;  Laterality: N/A;   PERIPHERAL VASCULAR THROMBECTOMY N/A 05/13/2020   Procedure: PERIPHERAL VASCULAR THROMBECTOMY- LYSIS RECHECK;  Surgeon: Cephus Shelling, MD;  Location: [facility];  Service: Cardiovascular;  Laterality: N/A;   PERIPHERAL VASCULAR THROMBECTOMY N/A 12/02/2020   Procedure: PERIPHERAL VASCULAR THROMBECTOMY;  Surgeon: Cephus Shelling, MD;  Location: [facility];  Service: Cardiovascular;  Laterality: N/A;   PERIPHERAL VASCULAR THROMBECTOMY N/A 12/03/2020   Procedure: LYSIS RECHECK;  Surgeon: Leonie Douglas, MD;  Location: [facility];  Service: Cardiovascular;  Laterality: N/A;   PULMONARY THROMBECTOMY N/A 09/23/2019   Procedure: LYSIS RECHECK;  Surgeon: Nada Libman, MD;  Location: [facility];  Service: Cardiovascular;  Laterality: N/A;   THROMBECTOMY FEMORAL ARTERY Left 12/14/2016   Procedure: THROMBECTOMY PROFUNDA FEMORAL ARTERY;  Surgeon: Fransisco Hertz, MD;  Location: [facility]  OR;  Service: Vascular;  Laterality: Left;   THROMBECTOMY FEMORAL ARTERY Left 06/16/2020   Procedure: Redo exposure ot Left below knee popiteal artery;  Surgeon: Cephus Shelling, MD;  Location: [facility] OR;  Service: Vascular;  Laterality: Left;   THROMBECTOMY OF BYPASS GRAFT FEMORAL- POPLITEAL ARTERY Left 06/16/2020   Procedure: THROMBECTOMY OF BYPASS  GRAFT FEMORAL-POPLITEAL ARTERY and TIBIAL ARTERY;  Surgeon: Cephus Shelling, MD;  Location: [facility] OR;  Service: Vascular;  Laterality: Left;    Social History   Socioeconomic History   Marital status: Married    Spouse name: Not on file   Number of children: Not on file   Years of education: Not on file   Highest education level: Not on file  Occupational History   Occupation: retired  Tobacco Use   Smoking status: Every Day    Packs/day: 0.75    Years: 56.00    Pack years: 42.00    Types: Cigarettes   Smokeless tobacco: Never  Vaping Use   Vaping Use: Never used  Substance and Sexual Activity   Alcohol use: Yes    Alcohol/week: 10.0 standard drinks    Types: 10 Shots of liquor per week    Comment:  01/29/2018 "1-2 shots per night"   Drug use: No   Sexual activity: Not on file  Other Topics Concern   Not on file  Social History Narrative   Not on file   Social Determinants of Health   Financial Resource Strain: Not on file  Food Insecurity: Not on file  Transportation Needs: Not on file  Physical Activity: Not on file  Stress: Not on file  Social Connections: Not on file  Intimate Partner Violence: Not on file    Family History  Problem Relation Age of Onset   Hypertension Mother    Diabetes Mother    Hypertension Father     Current Facility-Administered Medications  Medication Dose Route Frequency Provider Last Rate Last Admin   0.9 %  sodium chloride infusion   Intravenous Continuous Victorino Sparrow, MD 100 mL/hr at 12/22/20 1134 New Bag at 12/22/20 1134    No Known Allergies   REVIEW OF SYSTEMS:   [X]  denotes positive finding, [ ]  denotes negative finding Cardiac  Comments:  Chest pain or chest pressure:    Shortness of breath upon exertion:    Short of breath when lying flat:    Irregular heart rhythm:        Vascular    Pain in calf, thigh, or hip brought on by ambulation:    Pain in feet at night that wakes you up from your sleep:      Blood clot in your veins:    Leg swelling:         Pulmonary    Oxygen at home:    Productive cough:     Wheezing:         Neurologic    Sudden weakness in arms or legs:     Sudden numbness in arms or legs:     Sudden onset of difficulty speaking or slurred speech:    Temporary loss of vision in one eye:     Problems with dizziness:         Gastrointestinal    Blood in stool:     Vomited blood:         Genitourinary    Burning when urinating:     Blood in urine:  Psychiatric    Major depression:         Hematologic    Bleeding problems:    Problems with blood clotting too easily:        Skin    Rashes or ulcers:        Constitutional    Fever or chills:      PHYSICAL EXAMINATION:  Vitals:   12/22/20 1113  BP: 115/81  Pulse: 70  Resp: 15  Temp: 98.2 F (36.8 C)  TempSrc: Oral  SpO2: 100%  Weight: 63.5 kg  Height: 5\' 6"  (1.676 m)    General:  WDWN in NAD; vital signs documented above Gait: Not observed HENT: WNL, normocephalic Pulmonary: normal non-labored breathing , without wheezing Cardiac: regular HR,  Abdomen: soft, NT, no masses Skin: without rashes Vascular Exam/Pulses:  Right Left  Radial 2+ (normal) 2+ (normal)  Ulnar 2+ (normal) 2+ (normal)  Femoral    Popliteal    DP 2+ (normal) 2+ (normal)  PT 2+ (normal) 2+ (normal)   Extremities: without ischemic changes, without Gangrene , without cellulitis; without open wounds;  Musculoskeletal: no muscle wasting or atrophy  Neurologic: A&O X 3;  No focal weakness or paresthesias are detected Psychiatric:  The pt has Normal affect.   Non-Invasive Vascular Imaging:   Duplex ultrasonography of the previous bypass graft demonstrated occlusion Severe perfusion deficit in the left lower extremity demonstrated on ABI    ASSESSMENT/PLAN: Almir Botts is a 70 y.o. male presenting with occluded bypass.  Patient is currently in rest pain.  This bypass has been reopened several times.  I do  not think there is utility in performing the same procedure, as the most recent procedure lasted 2 weeks.  Patient would benefit from new bypass.  Previous imaging demonstrated patent peroneal artery. After discussing the risks and benefits of diagnostic angiography of the left leg in an effort to define a distal target, Kortez acted to proceed.  He is aware that with every intervention, maintaining perfusion to the left leg becomes more and more difficult.  If there is no target appreciated on angiogram, the only option will be below-knee amputation.  He was asked to continue his current medication regimen, holding the Eliquis starting tonight for intervention Wednesday.  Pending the results, we will discuss bypass versus amputation.   Sunday, MD [facility] [phone]

## 2020-12-24 ENCOUNTER — Encounter (HOSPITAL_COMMUNITY): Payer: Self-pay | Admitting: Vascular Surgery

## 2020-12-28 ENCOUNTER — Encounter: Payer: Self-pay | Admitting: Vascular Surgery

## 2020-12-28 ENCOUNTER — Other Ambulatory Visit: Payer: Self-pay

## 2020-12-28 ENCOUNTER — Other Ambulatory Visit: Payer: Self-pay | Admitting: Vascular Surgery

## 2020-12-28 ENCOUNTER — Ambulatory Visit (INDEPENDENT_AMBULATORY_CARE_PROVIDER_SITE_OTHER): Payer: Medicare Other | Admitting: Vascular Surgery

## 2020-12-28 VITALS — BP 132/70 | HR 65 | Temp 98.2°F | Resp 16 | Ht 66.0 in | Wt 140.0 lb

## 2020-12-28 DIAGNOSIS — I739 Peripheral vascular disease, unspecified: Secondary | ICD-10-CM

## 2020-12-28 DIAGNOSIS — Z9189 Other specified personal risk factors, not elsewhere classified: Secondary | ICD-10-CM

## 2020-12-28 DIAGNOSIS — Z9889 Other specified postprocedural states: Secondary | ICD-10-CM

## 2020-12-28 DIAGNOSIS — Z419 Encounter for procedure for purposes other than remedying health state, unspecified: Secondary | ICD-10-CM

## 2020-12-28 MED ORDER — HYDROCODONE-ACETAMINOPHEN 5-325 MG PO TABS: 1.0000 | ORAL_TABLET | Freq: Four times a day (QID) | ORAL | 0 refills | Status: DC | PRN

## 2020-12-28 NOTE — Progress Notes (Signed)
Patient name: Min Collymore MRN: [id number] DOB: January 10, 1951 Sex: male  REASON FOR VISIT: Discuss occluded left leg bypass  HPI: Travanti Mcmanus is a 70 y.o. male with history of hypertension and tobacco abuse as well as peripheral vascular disease that presents to discuss occluded left leg bypass.    He initially had a left common femoral to below-knee pop bypass with propaten by Dr. Imogene Burn in 2018.  This subsequently occluded in 2019 and I performed a redo left common femoral to below-knee popliteal bypass with 6 mm ringed PTFE.  The redo bypass in 2019 lasted until about August 2021 when he presented with critical limb ischemia with an occluded bypass and underwent thrombolysis of the occluded left leg bypass.  This was treated with a proximal 7 x 40 Eluvia and a distal 5 x 60 Tigris on 09/23/2019.  Bypass subsequently occluded again in 2022 with another thrombolysis and then most recently performed thrombectomy of the bypass as well as tibial thrombectomy and removal of the stent across the distal anastomosis with a bovine patch on 06/16/2020.  The bypass occluded again in November 2022 and underwent thrombolysis with subsequent angioplasty of the peroneal and TP trunk with compromised outflow.  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  This lasted about 2 weeks after hospital discharge and then the bypass thrombosed again and most recently underwent angiogram with Dr. Karin Lieu.  Dr. Karin Lieu discussed a left peroneal bypass but this was deferred given he would not accept blood products with significant risk given re-do nature of surgery.  He was discharged home.  He remains on Eliquis.    Today states he is having significant pain in the left foot.  He is adamant about not losing his leg.   Past Medical History:  Diagnosis Date   Hypertension    PVD (peripheral vascular disease) [facility])     Past Surgical History:  Procedure Laterality Date   ABDOMINAL AORTAGRAM Left  12/13/2016   ABDOMINAL AORTOGRAM W/LOWER EXTREMITY/notes 12/13/2016   ABDOMINAL AORTOGRAM W/LOWER EXTREMITY N/A 11/28/2016   Procedure: ABDOMINAL AORTOGRAM W/LOWER EXTREMITY;  Surgeon: Yates Decamp, MD;  Location: [facility] INVASIVE CV LAB;  Service: Cardiovascular;  Laterality: N/A;  bilateral   ABDOMINAL AORTOGRAM W/LOWER EXTREMITY N/A 12/13/2016   Procedure: ABDOMINAL AORTOGRAM W/LOWER EXTREMITY;  Surgeon: Maeola Harman, MD;  Location: [facility] INVASIVE CV LAB;  Service: Cardiovascular;  Laterality: N/A;  unilater left leg   ABDOMINAL AORTOGRAM W/LOWER EXTREMITY N/A 01/09/2018   Procedure: ABDOMINAL AORTOGRAM W/LOWER EXTREMITY;  Surgeon: Cephus Shelling, MD;  Location: [facility] INVASIVE CV LAB;  Service: Cardiovascular;  Laterality: N/A;   ABDOMINAL AORTOGRAM W/LOWER EXTREMITY Left 09/22/2019   Procedure: ABDOMINAL AORTOGRAM W/LOWER EXTREMITY;  Surgeon: Maeola Harman, MD;  Location: [facility] INVASIVE CV LAB;  Service: Cardiovascular;  Laterality: Left;   ABDOMINAL AORTOGRAM W/LOWER EXTREMITY Left 12/02/2020   Procedure: ABDOMINAL AORTOGRAM W/LOWER EXTREMITY;  Surgeon: Cephus Shelling, MD;  Location: [facility] INVASIVE CV LAB;  Service: Cardiovascular;  Laterality: Left;   ABDOMINAL AORTOGRAM W/LOWER EXTREMITY N/A 12/22/2020   Procedure: ABDOMINAL AORTOGRAM W/LOWER EXTREMITY;  Surgeon: Victorino Sparrow, MD;  Location: [facility] INVASIVE CV LAB;  Service: Cardiovascular;  Laterality: N/A;   EMBOLIZATION Left 12/03/2020   Procedure: EMBOLIZATION;  Surgeon: Leonie Douglas, MD;  Location: [facility] INVASIVE CV LAB;  Service: Cardiovascular;  Laterality: Left;   FEMORAL-POPLITEAL BYPASS GRAFT Left 12/14/2016   Procedure: BYPASS GRAFT COMMON FEMORAL- BELOW KNEE POPLITEAL ARTERY with Bovine Patch to Below  the knee poplitieal artery.;  Surgeon: Fransisco Hertz, MD;  Location: [facility] OR;  Service: Vascular;  Laterality: Left;   FEMORAL-POPLITEAL BYPASS GRAFT Left 01/28/2018   Procedure: BYPASS GRAFT FEMORAL-BELOW KNEE  POPLITEAL ARTERY REDO with propaten vascular graft removable ring;  Surgeon: Cephus Shelling, MD;  Location: [facility] OR;  Service: Vascular;  Laterality: Left;   FEMUR SURGERY Right 2009   pt. has a rod in that leg   LOWER EXTREMITY ANGIOGRAPHY Left 11/28/2016   Procedure: Lower Extremity Angiography;  Surgeon: Yates Decamp, MD;  Location: [facility] INVASIVE CV LAB;  Service: Cardiovascular;  Laterality: Left;  lysis followup lower leg   PATCH ANGIOPLASTY Left 01/28/2018   Procedure: PATCH ANGIOPLASTY USING Livia Snellen BIOLOGIC PATCH;  Surgeon: Cephus Shelling, MD;  Location[facility] OR;  Service: Vascular;  Laterality: Left;   PATCH ANGIOPLASTY Left 06/16/2020   Procedure: PATCH ANGIOPLASTY OF LEFT BELOW KNEE POPITEAL ARTERY;  Surgeon: Cephus Shelling, MD;  Location: [facility] OR;  Service: Vascular;  Laterality: Left;   PERIPHERAL VASCULAR BALLOON ANGIOPLASTY  11/28/2016   Procedure: PERIPHERAL VASCULAR BALLOON ANGIOPLASTY;  Surgeon: Yates Decamp, MD;  Location: [facility] INVASIVE CV LAB;  Service: Cardiovascular;;  SFA   PERIPHERAL VASCULAR BALLOON ANGIOPLASTY  12/03/2020   Procedure: PERIPHERAL VASCULAR BALLOON ANGIOPLASTY;  Surgeon: Leonie Douglas, MD;  Location: [facility] INVASIVE CV LAB;  Service: Cardiovascular;;  TP Trunk and Peroneal   PERIPHERAL VASCULAR CATHETERIZATION N/A 08/11/2014   Procedure: Lower Extremity Angiography;  Surgeon: Yates Decamp, MD;  Location: [facility] INVASIVE CV LAB;  Service: Cardiovascular;  Laterality: N/A;   PERIPHERAL VASCULAR INTERVENTION Left 09/22/2019   Procedure: PERIPHERAL VASCULAR INTERVENTION;  Surgeon: Maeola Harman, MD;  Location: [facility] INVASIVE CV LAB;  Service: Cardiovascular;  Laterality: Left;   PERIPHERAL VASCULAR INTERVENTION Left 09/23/2019   Procedure: PERIPHERAL VASCULAR INTERVENTION;  Surgeon: Nada Libman, MD;  Location: [facility] INVASIVE CV LAB;  Service: Cardiovascular;  Laterality: Left;   PERIPHERAL VASCULAR INTERVENTION Left 05/13/2020   Procedure: PERIPHERAL VASCULAR  INTERVENTION;  Surgeon: Cephus Shelling, MD;  Location: [facility] INVASIVE CV LAB;  Service: Cardiovascular;  Laterality: Left;   PERIPHERAL VASCULAR INTERVENTION Left 12/02/2020   Procedure: PERIPHERAL VASCULAR INTERVENTION;  Surgeon: Cephus Shelling, MD;  Location: [facility] INVASIVE CV LAB;  Service: Cardiovascular;  Laterality: Left;   PERIPHERAL VASCULAR INTERVENTION  12/03/2020   Procedure: PERIPHERAL VASCULAR INTERVENTION;  Surgeon: Leonie Douglas, MD;  Location: [facility] INVASIVE CV LAB;  Service: Cardiovascular;;   PERIPHERAL VASCULAR INTERVENTION Left 12/22/2020   Procedure: PERIPHERAL VASCULAR INTERVENTION;  Surgeon: Victorino Sparrow, MD;  Location: [facility] INVASIVE CV LAB;  Service: Cardiovascular;  Laterality: Left;   PERIPHERAL VASCULAR THROMBECTOMY  11/28/2016   Procedure: PERIPHERAL VASCULAR THROMBECTOMY;  Surgeon: Yates Decamp, MD;  Location: [facility] INVASIVE CV LAB;  Service: Cardiovascular;;  Profunda   PERIPHERAL VASCULAR THROMBECTOMY  11/28/2016   Procedure: PERIPHERAL VASCULAR THROMBECTOMY;  Surgeon: Yates Decamp, MD;  Location: [facility]VAS[facility] CV LAB;  Service: Cardiovascular;;   PERIPHERAL VASCULAR THROMBECTOMY N/A 05/12/2020   Procedure: PERIPHERAL VASCULAR THROMBECTOMY-Left Leg;  Surgeon: Leonie Douglas, MD;  Location: [facility]AS[facility] LAB;  Service: Cardiovascular;  Laterality: N/A;   PERIPHERAL VASCULAR THROMBECTOMY N/A 05/13/2020   Procedure: PERIPHERAL VASCULAR THROMBECTOMY- LYSIS RECHECK;  Surgeon: Cephus Shelling, MD;  Location: [facility] INVAS[facility] LAB;  Service: Cardiovascular;  Laterality: N/A;   PERIPHERAL VASCULAR THROMBECTOMY N/A 12/02/2020   Procedure: PERIPHERAL VASCULAR THROMBECTOMY;  Surgeon: Cephus Shelling, MD;  Location: [facility] INVASIVE [facility];  Service: Cardiovascular;  Laterality: N/A;   PERIPHERAL VASCULAR THROMBECTOMY N/A 12/03/2020   Procedure: LYSIS RECHECK;  Surgeon: Leonie Douglas, MD;  Location: [facility] INVASIVE CV LAB;  Service: Cardiovascular;  Laterality: N/A;   PULMONARY  THROMBECTOMY N/A 09/23/2019   Procedure: LYSIS RECHECK;  Surgeon: Nada Libman, MD;  Location: [facility] INVASIVE CV LAB;  Service: Cardiovascular;  Laterality: N/A;   THROMBECTOMY FEMORAL ARTERY Left 12/14/2016   Procedure: THROMBECTOMY PROFUNDA FEMORAL ARTERY;  Surgeon: Fransisco Hertz, MD;  Location: [facility] OR;  Service: Vascular;  Laterality: Left;   THROMBECTOMY FEMORAL ARTERY Left 06/16/2020   Procedure: Redo exposure ot Left below knee popiteal artery;  Surgeon: Cephus Shelling, MD;  Location: [facility] OR;  Service: Vascular;  Laterality: Left;   THROMBECTOMY OF BYPASS GRAFT FEMORAL- POPLITEAL ARTERY Left 06/16/2020   Procedure: THROMBECTOMY OF BYPASS GRAFT FEMORAL-POPLITEAL ARTERY and TIBIAL ARTERY;  Surgeon: Cephus Shelling, MD;  Location: [facility] OR;  Service: Vascular;  Laterality: Left;    Family History  Problem Relation Age of Onset   Hypertension Mother    Diabetes Mother    Hypertension Father     SOCIAL HISTORY: Social History   Tobacco Use   Smoking status: Some Days    Packs/day: 0.75    Years: 56.00    Pack years: 42.00    Types: Cigarettes   Smokeless tobacco: Never   Tobacco comments:    "Haven't smoked since last surgery"  Substance Use Topics   Alcohol use: Yes    Alcohol/week: 10.0 standard drinks    Types: 10 Shots of liquor per week    Comment:  01/29/2018 "1-2 shots per night"    No Known Allergies  Current Outpatient Medications  Medication Sig Dispense Refill   apixaban (ELIQUIS) 5 MG TABS tablet Restart tomorrow 60 tablet 0   atenolol (TENORMIN) 50 MG tablet Take 50 mg by mouth in the morning.     clopidogrel (PLAVIX) 75 MG tablet TAKE 1 TABLET BY MOUTH EVERY DAY 90 tablet 3   losartan-hydrochlorothiazide (HYZAAR) 100-25 MG tablet Take 1 tablet by mouth in the morning.     rosuvastatin (CRESTOR) 20 MG tablet Take 20 mg by mouth at bedtime.     HYDROcodone-acetaminophen (NORCO) 5-325 MG tablet Take 1 tablet by mouth every 6 (six) hours as needed for  moderate pain. (Patient not taking: Reported on 12/28/2020) 30 tablet 0   No current facility-administered medications for this visit.    REVIEW OF SYSTEMS:  [X]  denotes positive finding, [ ]  denotes negative finding Cardiac  Comments:  Chest pain or chest pressure:    Shortness of breath upon exertion:    Short of breath when lying flat:    Irregular heart rhythm:        Vascular    Pain in calf, thigh, or hip brought on by ambulation:    Pain in feet at night that wakes you up from your sleep:  x Left  Blood clot in your veins:    Leg swelling:         Pulmonary    Oxygen at home:    Productive cough:     Wheezing:         Neurologic    Sudden weakness in arms or legs:     Sudden numbness in arms or legs:     Sudden onset of difficulty speaking or slurred speech:    Temporary loss of vision in one eye:     Problems with dizziness:  Gastrointestinal    Blood in stool:     Vomited blood:         Genitourinary    Burning when urinating:     Blood in urine:        Psychiatric    Major depression:         Hematologic    Bleeding problems:    Problems with blood clotting too easily:        Skin    Rashes or ulcers:        Constitutional    Fever or chills:      PHYSICAL EXAM: Vitals:   12/28/20 1034  BP: 132/70  Pulse: 65  Resp: 16  Temp: 98.2 F (36.8 C)  TempSrc: Temporal  SpO2: 97%  Weight: 140 lb (63.5 kg)  Height: 5\' 6"  (1.676 m)    GENERAL: The patient is a well-nourished male, in no acute distress. The vital signs are documented above. CARDIAC: There is a regular rate and rhythm.  VASCULAR:  Palpable femoral pulses bilaterally Left groin and leg incision previously well-healed Left foot is cool to touch but motor or sensory intact  DATA:   ABI 12/20/2020 showed a dampened monophasic 0.22 with occluded left leg bypass  Assessment/Plan:  70 year old male presents with occluded redo left common femoral to below-knee popliteal  prosthetic bypass.  This has undergone multiple previous attempts at thrombolysis as well as thrombectomy and distal patch of the distal anastomosis.  Most recently underwent thrombolysis about 2 weeks ago and this occluded shortly after hospital discharge.  He only has compromised peroneal runoff at this time.    I have discussed that I think his best option moving forward would be a below-knee amputation given I think his options are not very good.  He has no vein for conduit.  He would require a redo redo bypass to the peroneal with prosthetic with compromised outflow.  He is adamantly refusing to consider an amputation.  States he would rather lose his life over his leg.  I discussed my concern about any attempts at redo bypass including the risk of significant bleeding and how he previously did not want blood products.  He now states he is amendable to blood transfusions.  I also discussed risk of anesthesia, risk of MI, risk of stroke, risk of bleeding, risk of infection, risk of poor durability of bypass, risk of wound problems, and even death.  He ultimately wants to proceed with the peroneal bypass that was discussed by Dr. 61 last week.  I have stated that I think at best this may have 3 months of durability and may not even stay patent out of the operating room given compromised runoff.  He understands this and wishes to proceed.  I discussed I would cut down on the perineal target first and if this is not  sewable there would be nothing further to offer.  He is quickly running out of options and even if this peroneal bypass works he will be facing an amputation in the near future as I discussed with him today.  He is also still smoking even after many discussions in the past.  Karin Lieu, MD [facility] Office: [phone]  [phone]

## 2020-12-29 DIAGNOSIS — E119 Type 2 diabetes mellitus without complications: Secondary | ICD-10-CM | POA: Diagnosis not present

## 2020-12-29 DIAGNOSIS — E785 Hyperlipidemia, unspecified: Secondary | ICD-10-CM | POA: Diagnosis not present

## 2020-12-29 DIAGNOSIS — I10 Essential (primary) hypertension: Secondary | ICD-10-CM | POA: Diagnosis not present

## 2020-12-29 LAB — SARS CORONAVIRUS 2 (TAT 6-24 HRS): SARS Coronavirus 2: POSITIVE — AB

## 2020-12-30 ENCOUNTER — Encounter (HOSPITAL_COMMUNITY): Payer: Self-pay | Admitting: Vascular Surgery

## 2020-12-30 NOTE — Progress Notes (Signed)
Spoke with pt and his wife, Ryan [name] for pre-op call. Pt denies cardiac history, but does have PAD. Wife states pt is not diabetic.  In patient's chart it is listed that he refuses all blood products. In Dr. Ophelia Charter note on 12/28/20 he writes that pt has agreed to have a blood transfusion during surgery if needed. I asked the pt today if he still plans to accept a blood transfusion and he stated yes. Forms in chart.  Pt tested positive for Covid on 12/28/20. I notified Dr. Ophelia Charter office this morning. Spoke with Trinna Post and she has checked with Dr. Chestine Spore and he plans to proceed with surgery. Pt states he has no symptoms of Covid. Pt aware of positive report.

## 2020-12-30 NOTE — Anesthesia Preprocedure Evaluation (Addendum)
Anesthesia Evaluation  Patient identified by MRN, date of birth, ID band Patient awake    Reviewed: Allergy & Precautions, NPO status , Patient's Chart, lab work & pertinent test results, reviewed documented beta blocker date and time   History of Anesthesia Complications Negative for: history of anesthetic complications  Airway Mallampati: II  TM Distance: >3 FB Neck ROM: Full    Dental  (+) Edentulous Upper, Edentulous Lower   Pulmonary Current Smoker and Patient abstained from smoking.,    Pulmonary exam normal        Cardiovascular hypertension, Pt. on home beta blockers and Pt. on medications + Peripheral Vascular Disease  Normal cardiovascular exam     Neuro/Psych negative neurological ROS  negative psych ROS   GI/Hepatic negative GI ROS, Neg liver ROS,   Endo/Other   K 2.9   Renal/GU negative Renal ROS     Musculoskeletal negative musculoskeletal ROS (+)   Abdominal   Peds  Hematology  On eliquis    Anesthesia Other Findings Covid+ 12/28/20, asymptomatic  Reproductive/Obstetrics                            Anesthesia Physical Anesthesia Plan  ASA: 3  Anesthesia Plan: General   Post-op Pain Management: Tylenol PO (pre-op)   Induction: Intravenous and Rapid sequence  PONV Risk Score and Plan: 3 and Treatment may vary due to age or medical condition, Ondansetron and Dexamethasone  Airway Management Planned: Oral ETT  Additional Equipment:   Intra-op Plan:   Post-operative Plan: Extubation in OR  Informed Consent: I have reviewed the patients History and Physical, chart, labs and discussed the procedure including the risks, benefits and alternatives for the proposed anesthesia with the patient or authorized representative who has indicated his/her understanding and acceptance.     Dental advisory given  Plan Discussed with: CRNA and Anesthesiologist  Anesthesia  Plan Comments: (Will replenish K intraop)       Anesthesia Quick Evaluation

## 2020-12-31 ENCOUNTER — Inpatient Hospital Stay (HOSPITAL_COMMUNITY)
Admission: RE | Admit: 2020-12-31 | Discharge: 2021-01-04 | DRG: 253 | Disposition: A | Discharge status: Home Health | Payer: Medicare Other | Attending: Vascular Surgery | Admitting: Vascular Surgery

## 2020-12-31 ENCOUNTER — Encounter (HOSPITAL_COMMUNITY): Payer: Self-pay | Admitting: Vascular Surgery

## 2020-12-31 ENCOUNTER — Encounter (HOSPITAL_COMMUNITY)
Admission: RE | Disposition: A | Discharge status: Home Health | Payer: Self-pay | Source: Home / Self Care | Attending: Vascular Surgery

## 2020-12-31 ENCOUNTER — Inpatient Hospital Stay (HOSPITAL_COMMUNITY): Payer: Medicare Other | Admitting: Certified Registered Nurse Anesthetist

## 2020-12-31 ENCOUNTER — Other Ambulatory Visit: Payer: Self-pay

## 2020-12-31 DIAGNOSIS — Z7902 Long term (current) use of antithrombotics/antiplatelets: Secondary | ICD-10-CM | POA: Diagnosis not present

## 2020-12-31 DIAGNOSIS — Z7901 Long term (current) use of anticoagulants: Secondary | ICD-10-CM

## 2020-12-31 DIAGNOSIS — Z79899 Other long term (current) drug therapy: Secondary | ICD-10-CM

## 2020-12-31 DIAGNOSIS — N179 Acute kidney failure, unspecified: Secondary | ICD-10-CM | POA: Diagnosis not present

## 2020-12-31 DIAGNOSIS — F17210 Nicotine dependence, cigarettes, uncomplicated: Secondary | ICD-10-CM | POA: Diagnosis not present

## 2020-12-31 DIAGNOSIS — Z86718 Personal history of other venous thrombosis and embolism: Secondary | ICD-10-CM | POA: Diagnosis not present

## 2020-12-31 DIAGNOSIS — Z419 Encounter for procedure for purposes other than remedying health state, unspecified: Secondary | ICD-10-CM

## 2020-12-31 DIAGNOSIS — D62 Acute posthemorrhagic anemia: Secondary | ICD-10-CM | POA: Diagnosis not present

## 2020-12-31 DIAGNOSIS — T82868A Thrombosis of vascular prosthetic devices, implants and grafts, initial encounter: Secondary | ICD-10-CM | POA: Diagnosis not present

## 2020-12-31 DIAGNOSIS — I10 Essential (primary) hypertension: Secondary | ICD-10-CM | POA: Diagnosis present

## 2020-12-31 DIAGNOSIS — Y832 Surgical operation with anastomosis, bypass or graft as the cause of abnormal reaction of the patient, or of later complication, without mention of misadventure at the time of the procedure: Secondary | ICD-10-CM | POA: Diagnosis present

## 2020-12-31 DIAGNOSIS — Z9189 Other specified personal risk factors, not elsewhere classified: Secondary | ICD-10-CM

## 2020-12-31 DIAGNOSIS — Z9889 Other specified postprocedural states: Secondary | ICD-10-CM

## 2020-12-31 DIAGNOSIS — Z8249 Family history of ischemic heart disease and other diseases of the circulatory system: Secondary | ICD-10-CM | POA: Diagnosis not present

## 2020-12-31 DIAGNOSIS — I739 Peripheral vascular disease, unspecified: Secondary | ICD-10-CM | POA: Diagnosis present

## 2020-12-31 DIAGNOSIS — I70222 Atherosclerosis of native arteries of extremities with rest pain, left leg: Secondary | ICD-10-CM | POA: Diagnosis not present

## 2020-12-31 DIAGNOSIS — I723 Aneurysm of iliac artery: Secondary | ICD-10-CM | POA: Diagnosis present

## 2020-12-31 DIAGNOSIS — T82868D Thrombosis of vascular prosthetic devices, implants and grafts, subsequent encounter: Secondary | ICD-10-CM

## 2020-12-31 HISTORY — PX: BYPASS GRAFT POPLITEAL TO TIBIAL: SHX5764

## 2020-12-31 HISTORY — PX: VEIN HARVEST: SHX6363

## 2020-12-31 HISTORY — PX: THROMBECTOMY OF BYPASS GRAFT FEMORAL- POPLITEAL ARTERY: SHX6902

## 2020-12-31 LAB — URINALYSIS, ROUTINE W REFLEX MICROSCOPIC
Bilirubin Urine: NEGATIVE
Glucose, UA: NEGATIVE mg/dL
Ketones, ur: NEGATIVE mg/dL
Leukocytes,Ua: NEGATIVE
Nitrite: NEGATIVE
Protein, ur: 100 mg/dL — AB
Specific Gravity, Urine: 1.025 (ref 1.005–1.030)
pH: 6 (ref 5.0–8.0)

## 2020-12-31 LAB — BLOOD GAS, ARTERIAL
Acid-base deficit: 5.6 mmol/L — ABNORMAL HIGH (ref 0.0–2.0)
Bicarbonate: 18.9 mmol/L — ABNORMAL LOW (ref 20.0–28.0)
Drawn by: 60286
FIO2: 21
O2 Saturation: 95.6 %
Patient temperature: 36.5
pCO2 arterial: 33.7 mmHg (ref 32.0–48.0)
pH, Arterial: 7.364 (ref 7.350–7.450)
pO2, Arterial: 81.6 mmHg — ABNORMAL LOW (ref 83.0–108.0)

## 2020-12-31 LAB — CBC
HCT: 33.5 % — ABNORMAL LOW (ref 39.0–52.0)
HCT: 27.6 % — ABNORMAL LOW (ref 39.0–52.0)
Hemoglobin: 10.3 g/dL — ABNORMAL LOW (ref 13.0–17.0)
Hemoglobin: 8.9 g/dL — ABNORMAL LOW (ref 13.0–17.0)
MCH: 25.2 pg — ABNORMAL LOW (ref 26.0–34.0)
MCH: 27.7 pg (ref 26.0–34.0)
MCHC: 30.7 g/dL (ref 30.0–36.0)
MCHC: 32.2 g/dL (ref 30.0–36.0)
MCV: 81.9 fL (ref 80.0–100.0)
MCV: 86 fL (ref 80.0–100.0)
Platelets: 328 10*3/uL (ref 150–400)
Platelets: 170 10*3/uL (ref 150–400)
RBC: 4.09 MIL/uL — ABNORMAL LOW (ref 4.22–5.81)
RBC: 3.21 MIL/uL — ABNORMAL LOW (ref 4.22–5.81)
RDW: 15.2 % (ref 11.5–15.5)
RDW: 15.2 % (ref 11.5–15.5)
WBC: 8.5 10*3/uL (ref 4.0–10.5)
WBC: 10.4 10*3/uL (ref 4.0–10.5)
nRBC: 0 % (ref 0.0–0.2)
nRBC: 0 % (ref 0.0–0.2)

## 2020-12-31 LAB — COMPREHENSIVE METABOLIC PANEL
ALT: 12 U/L (ref 0–44)
AST: 17 U/L (ref 15–41)
Albumin: 3.4 g/dL — ABNORMAL LOW (ref 3.5–5.0)
Alkaline Phosphatase: 70 U/L (ref 38–126)
Anion gap: 11 (ref 5–15)
BUN: 32 mg/dL — ABNORMAL HIGH (ref 8–23)
CO2: 18 mmol/L — ABNORMAL LOW (ref 22–32)
Calcium: 9.1 mg/dL (ref 8.9–10.3)
Chloride: 104 mmol/L (ref 98–111)
Creatinine, Ser: 1.28 mg/dL — ABNORMAL HIGH (ref 0.61–1.24)
GFR, Estimated: 59 mL/min — ABNORMAL LOW (ref >60)
Glucose, Bld: 156 mg/dL — ABNORMAL HIGH (ref 70–99)
Potassium: 2.9 mmol/L — ABNORMAL LOW (ref 3.5–5.1)
Sodium: 133 mmol/L — ABNORMAL LOW (ref 135–145)
Total Bilirubin: 0.7 mg/dL (ref 0.3–1.2)
Total Protein: 7.8 g/dL (ref 6.5–8.1)

## 2020-12-31 LAB — URINALYSIS, MICROSCOPIC (REFLEX)
Bacteria, UA: NONE SEEN
Squamous Epithelial / HPF: NONE SEEN (ref 0–5)

## 2020-12-31 LAB — PROTIME-INR
INR: 1.1 (ref 0.8–1.2)
Prothrombin Time: 14.4 seconds (ref 11.4–15.2)

## 2020-12-31 LAB — SURGICAL PCR SCREEN
MRSA, PCR: NEGATIVE
Staphylococcus aureus: NEGATIVE

## 2020-12-31 LAB — APTT: aPTT: 33 seconds (ref 24–36)

## 2020-12-31 LAB — PREPARE RBC (CROSSMATCH)

## 2020-12-31 LAB — CREATININE, SERUM
Creatinine, Ser: 1.35 mg/dL — ABNORMAL HIGH (ref 0.61–1.24)
GFR, Estimated: 56 mL/min — ABNORMAL LOW (ref >60)

## 2020-12-31 SURGERY — GROIN EXPOSURE
Anesthesia: General | Laterality: Left

## 2020-12-31 MED ORDER — CHLORHEXIDINE GLUCONATE CLOTH 2 % EX PADS: 6.0000 | MEDICATED_PAD | Freq: Once | CUTANEOUS | Status: DC

## 2020-12-31 MED ORDER — ATENOLOL 25 MG PO TABS
50.0000 mg | ORAL_TABLET | Freq: Every day | ORAL | Status: DC
  Administered 2021-01-02 – 2021-01-04 (×3): 50 mg via ORAL
  Filled 2020-12-31 (×4): qty 2

## 2020-12-31 MED ORDER — SODIUM CHLORIDE 0.9% IV SOLUTION: Freq: Once | INTRAVENOUS | Status: DC

## 2020-12-31 MED ORDER — CEFAZOLIN SODIUM-DEXTROSE 2-4 GM/100ML-% IV SOLN
2.0000 g | Freq: Three times a day (TID) | INTRAVENOUS | Status: AC
  Administered 2020-12-31 (×2): 2 g via INTRAVENOUS
  Filled 2020-12-31 (×2): qty 100

## 2020-12-31 MED ORDER — POTASSIUM CHLORIDE 10 MEQ/100ML IV SOLN
INTRAVENOUS | Status: DC | PRN
  Administered 2020-12-31 (×3): 10 meq via INTRAVENOUS

## 2020-12-31 MED ORDER — OXYCODONE-ACETAMINOPHEN 5-325 MG PO TABS
1.0000 | ORAL_TABLET | ORAL | Status: DC | PRN
  Administered 2020-12-31: 1 via ORAL
  Administered 2020-12-31 – 2021-01-04 (×13): 2 via ORAL
  Filled 2020-12-31 (×6): qty 2
  Filled 2020-12-31: qty 1
  Filled 2020-12-31 (×7): qty 2

## 2020-12-31 MED ORDER — PROPOFOL 10 MG/ML IV BOLUS
INTRAVENOUS | Status: AC
  Filled 2020-12-31: qty 20

## 2020-12-31 MED ORDER — FENTANYL CITRATE (PF) 250 MCG/5ML IJ SOLN
INTRAMUSCULAR | Status: AC
  Filled 2020-12-31: qty 5

## 2020-12-31 MED ORDER — SUGAMMADEX SODIUM 200 MG/2ML IV SOLN
INTRAVENOUS | Status: DC | PRN
  Administered 2020-12-31: 150 mg via INTRAVENOUS

## 2020-12-31 MED ORDER — ONDANSETRON HCL 4 MG/2ML IJ SOLN: 4.0000 mg | Freq: Once | INTRAMUSCULAR | Status: DC | PRN

## 2020-12-31 MED ORDER — LIDOCAINE 2% (20 MG/ML) 5 ML SYRINGE
INTRAMUSCULAR | Status: DC | PRN
  Administered 2020-12-31: 80 mg via INTRAVENOUS

## 2020-12-31 MED ORDER — HEPARIN 6000 UNIT IRRIGATION SOLUTION
Status: AC
  Filled 2020-12-31: qty 500

## 2020-12-31 MED ORDER — SODIUM CHLORIDE 0.9 % IV SOLN: INTRAVENOUS | Status: DC

## 2020-12-31 MED ORDER — CHLORHEXIDINE GLUCONATE 0.12 % MT SOLN
15.0000 mL | Freq: Once | OROMUCOSAL | Status: AC
  Administered 2020-12-31: 15 mL via OROMUCOSAL
  Filled 2020-12-31: qty 15

## 2020-12-31 MED ORDER — HYDRALAZINE HCL 20 MG/ML IJ SOLN: 5.0000 mg | INTRAMUSCULAR | Status: DC | PRN

## 2020-12-31 MED ORDER — ACETAMINOPHEN 650 MG RE SUPP: 325.0000 mg | RECTAL | Status: DC | PRN

## 2020-12-31 MED ORDER — HEPARIN SODIUM (PORCINE) 1000 UNIT/ML IJ SOLN
INTRAMUSCULAR | Status: DC | PRN
  Administered 2020-12-31: 2000 [IU] via INTRAVENOUS
  Administered 2020-12-31: 6000 [IU] via INTRAVENOUS

## 2020-12-31 MED ORDER — ALBUMIN HUMAN 25 % IV SOLN: INTRAVENOUS | Status: DC | PRN

## 2020-12-31 MED ORDER — EPHEDRINE 5 MG/ML INJ
INTRAVENOUS | Status: AC
  Filled 2020-12-31: qty 5

## 2020-12-31 MED ORDER — POLYETHYLENE GLYCOL 3350 17 G PO PACK: 17.0000 g | PACK | Freq: Every day | ORAL | Status: DC | PRN

## 2020-12-31 MED ORDER — LOSARTAN POTASSIUM 50 MG PO TABS
100.0000 mg | ORAL_TABLET | Freq: Every day | ORAL | Status: DC
  Administered 2021-01-02 – 2021-01-04 (×3): 100 mg via ORAL
  Filled 2020-12-31 (×4): qty 2

## 2020-12-31 MED ORDER — HYDROCHLOROTHIAZIDE 25 MG PO TABS
25.0000 mg | ORAL_TABLET | Freq: Every day | ORAL | Status: DC
  Administered 2021-01-02 – 2021-01-04 (×3): 25 mg via ORAL
  Filled 2020-12-31 (×4): qty 1

## 2020-12-31 MED ORDER — BISACODYL 5 MG PO TBEC: 5.0000 mg | DELAYED_RELEASE_TABLET | Freq: Every day | ORAL | Status: DC | PRN

## 2020-12-31 MED ORDER — OXYCODONE HCL 5 MG PO TABS: 5.0000 mg | ORAL_TABLET | Freq: Once | ORAL | Status: DC | PRN

## 2020-12-31 MED ORDER — ONDANSETRON HCL 4 MG/2ML IJ SOLN: 4.0000 mg | Freq: Four times a day (QID) | INTRAMUSCULAR | Status: DC | PRN

## 2020-12-31 MED ORDER — ORAL CARE MOUTH RINSE: 15.0000 mL | Freq: Once | OROMUCOSAL | Status: AC

## 2020-12-31 MED ORDER — ROCURONIUM BROMIDE 10 MG/ML (PF) SYRINGE
PREFILLED_SYRINGE | INTRAVENOUS | Status: AC
  Filled 2020-12-31: qty 10

## 2020-12-31 MED ORDER — SODIUM CHLORIDE 0.9 % IV SOLN: Freq: Once | INTRAVENOUS | Status: AC

## 2020-12-31 MED ORDER — PANTOPRAZOLE SODIUM 40 MG PO TBEC
40.0000 mg | DELAYED_RELEASE_TABLET | Freq: Every day | ORAL | Status: DC
  Administered 2020-12-31 – 2021-01-04 (×5): 40 mg via ORAL
  Filled 2020-12-31 (×5): qty 1

## 2020-12-31 MED ORDER — ALUM & MAG HYDROXIDE-SIMETH 200-200-20 MG/5ML PO SUSP: 15.0000 mL | ORAL | Status: DC | PRN

## 2020-12-31 MED ORDER — HEPARIN SODIUM (PORCINE) 5000 UNIT/ML IJ SOLN
5000.0000 [IU] | Freq: Three times a day (TID) | INTRAMUSCULAR | Status: DC
  Administered 2021-01-01 – 2021-01-04 (×10): 5000 [IU] via SUBCUTANEOUS
  Filled 2020-12-31 (×9): qty 1

## 2020-12-31 MED ORDER — LACTATED RINGERS IV SOLN: INTRAVENOUS | Status: DC | PRN

## 2020-12-31 MED ORDER — ASPIRIN EC 81 MG PO TBEC
81.0000 mg | DELAYED_RELEASE_TABLET | Freq: Every day | ORAL | Status: DC
  Administered 2021-01-01 – 2021-01-04 (×4): 81 mg via ORAL
  Filled 2020-12-31 (×4): qty 1

## 2020-12-31 MED ORDER — ONDANSETRON HCL 4 MG/2ML IJ SOLN
INTRAMUSCULAR | Status: DC | PRN
  Administered 2020-12-31: 4 mg via INTRAVENOUS

## 2020-12-31 MED ORDER — LABETALOL HCL 5 MG/ML IV SOLN: 10.0000 mg | INTRAVENOUS | Status: DC | PRN

## 2020-12-31 MED ORDER — HEPARIN 6000 UNIT IRRIGATION SOLUTION
Status: DC | PRN
  Administered 2020-12-31: 1

## 2020-12-31 MED ORDER — FENTANYL CITRATE (PF) 100 MCG/2ML IJ SOLN
INTRAMUSCULAR | Status: AC
  Filled 2020-12-31: qty 2

## 2020-12-31 MED ORDER — LACTATED RINGERS IV SOLN: INTRAVENOUS | Status: DC

## 2020-12-31 MED ORDER — POTASSIUM CHLORIDE CRYS ER 20 MEQ PO TBCR
20.0000 meq | EXTENDED_RELEASE_TABLET | Freq: Every day | ORAL | Status: AC | PRN
  Administered 2021-01-01: 40 meq via ORAL
  Filled 2020-12-31: qty 2

## 2020-12-31 MED ORDER — LIDOCAINE 2% (20 MG/ML) 5 ML SYRINGE
INTRAMUSCULAR | Status: AC
  Filled 2020-12-31: qty 5

## 2020-12-31 MED ORDER — EPHEDRINE SULFATE-NACL 50-0.9 MG/10ML-% IV SOSY
PREFILLED_SYRINGE | INTRAVENOUS | Status: DC | PRN
  Administered 2020-12-31: 10 mg via INTRAVENOUS

## 2020-12-31 MED ORDER — ACETAMINOPHEN 500 MG PO TABS
1000.0000 mg | ORAL_TABLET | Freq: Once | ORAL | Status: AC
  Administered 2020-12-31: 1000 mg via ORAL
  Filled 2020-12-31: qty 2

## 2020-12-31 MED ORDER — SUCCINYLCHOLINE CHLORIDE 200 MG/10ML IV SOSY
PREFILLED_SYRINGE | INTRAVENOUS | Status: AC
  Filled 2020-12-31: qty 10

## 2020-12-31 MED ORDER — METOPROLOL TARTRATE 5 MG/5ML IV SOLN: 2.0000 mg | INTRAVENOUS | Status: DC | PRN

## 2020-12-31 MED ORDER — FENTANYL CITRATE (PF) 250 MCG/5ML IJ SOLN
INTRAMUSCULAR | Status: DC | PRN
  Administered 2020-12-31 (×3): 50 ug via INTRAVENOUS

## 2020-12-31 MED ORDER — CEFAZOLIN SODIUM-DEXTROSE 2-4 GM/100ML-% IV SOLN
2.0000 g | INTRAVENOUS | Status: AC
  Administered 2020-12-31: 2 g via INTRAVENOUS
  Filled 2020-12-31: qty 100

## 2020-12-31 MED ORDER — MORPHINE SULFATE (PF) 2 MG/ML IV SOLN
2.0000 mg | INTRAVENOUS | Status: DC | PRN
  Administered 2020-12-31 – 2021-01-01 (×2): 2 mg via INTRAVENOUS
  Filled 2020-12-31 (×2): qty 1

## 2020-12-31 MED ORDER — 0.9 % SODIUM CHLORIDE (POUR BTL) OPTIME
TOPICAL | Status: DC | PRN
  Administered 2020-12-31: 2000 mL

## 2020-12-31 MED ORDER — DOCUSATE SODIUM 100 MG PO CAPS
100.0000 mg | ORAL_CAPSULE | Freq: Every day | ORAL | Status: DC
  Administered 2021-01-01 – 2021-01-04 (×4): 100 mg via ORAL
  Filled 2020-12-31 (×4): qty 1

## 2020-12-31 MED ORDER — PHENOL 1.4 % MT LIQD: 1.0000 | OROMUCOSAL | Status: DC | PRN

## 2020-12-31 MED ORDER — MAGNESIUM SULFATE 2 GM/50ML IV SOLN: 2.0000 g | Freq: Every day | INTRAVENOUS | Status: DC | PRN

## 2020-12-31 MED ORDER — PHENYLEPHRINE 40 MCG/ML (10ML) SYRINGE FOR IV PUSH (FOR BLOOD PRESSURE SUPPORT)
PREFILLED_SYRINGE | INTRAVENOUS | Status: AC
  Filled 2020-12-31: qty 20

## 2020-12-31 MED ORDER — MIDAZOLAM HCL 2 MG/2ML IJ SOLN
INTRAMUSCULAR | Status: AC
  Filled 2020-12-31: qty 2

## 2020-12-31 MED ORDER — ROSUVASTATIN CALCIUM 20 MG PO TABS
20.0000 mg | ORAL_TABLET | Freq: Every day | ORAL | Status: DC
  Administered 2020-12-31 – 2021-01-03 (×4): 20 mg via ORAL
  Filled 2020-12-31 (×4): qty 1

## 2020-12-31 MED ORDER — ACETAMINOPHEN 325 MG PO TABS: 325.0000 mg | ORAL_TABLET | ORAL | Status: DC | PRN

## 2020-12-31 MED ORDER — PHENYLEPHRINE HCL (PRESSORS) 10 MG/ML IV SOLN
INTRAVENOUS | Status: DC | PRN
  Administered 2020-12-31 (×5): 80 ug via INTRAVENOUS
  Administered 2020-12-31: 120 ug via INTRAVENOUS
  Administered 2020-12-31: 80 ug via INTRAVENOUS

## 2020-12-31 MED ORDER — LOSARTAN POTASSIUM-HCTZ 100-25 MG PO TABS: 1.0000 | ORAL_TABLET | Freq: Every morning | ORAL | Status: DC

## 2020-12-31 MED ORDER — EPHEDRINE SULFATE 50 MG/ML IJ SOLN
INTRAMUSCULAR | Status: DC | PRN
  Administered 2020-12-31 (×5): 5 mg via INTRAVENOUS

## 2020-12-31 MED ORDER — PHENYLEPHRINE HCL-NACL 20-0.9 MG/250ML-% IV SOLN
INTRAVENOUS | Status: DC | PRN
  Administered 2020-12-31: 35 ug/min via INTRAVENOUS

## 2020-12-31 MED ORDER — GUAIFENESIN-DM 100-10 MG/5ML PO SYRP: 15.0000 mL | ORAL_SOLUTION | ORAL | Status: DC | PRN

## 2020-12-31 MED ORDER — SODIUM CHLORIDE 0.9 % IV SOLN: 500.0000 mL | Freq: Once | INTRAVENOUS | Status: DC | PRN

## 2020-12-31 MED ORDER — PROTAMINE SULFATE 10 MG/ML IV SOLN
INTRAVENOUS | Status: DC | PRN
  Administered 2020-12-31: 50 mg via INTRAVENOUS

## 2020-12-31 MED ORDER — FENTANYL CITRATE (PF) 100 MCG/2ML IJ SOLN
25.0000 ug | INTRAMUSCULAR | Status: DC | PRN
  Administered 2020-12-31: 25 ug via INTRAVENOUS

## 2020-12-31 MED ORDER — PROPOFOL 10 MG/ML IV BOLUS
INTRAVENOUS | Status: DC | PRN
  Administered 2020-12-31: 120 mg via INTRAVENOUS

## 2020-12-31 MED ORDER — SUCCINYLCHOLINE CHLORIDE 200 MG/10ML IV SOSY
PREFILLED_SYRINGE | INTRAVENOUS | Status: DC | PRN
  Administered 2020-12-31: 100 mg via INTRAVENOUS

## 2020-12-31 MED ORDER — ROCURONIUM BROMIDE 10 MG/ML (PF) SYRINGE
PREFILLED_SYRINGE | INTRAVENOUS | Status: DC | PRN
  Administered 2020-12-31 (×2): 20 mg via INTRAVENOUS
  Administered 2020-12-31: 50 mg via INTRAVENOUS

## 2020-12-31 MED ORDER — OXYCODONE HCL 5 MG/5ML PO SOLN: 5.0000 mg | Freq: Once | ORAL | Status: DC | PRN

## 2020-12-31 SURGICAL SUPPLY — 68 items
BAG COUNTER SPONGE SURGICOUNT (BAG) IMPLANT
BALLN MUSTANG 5.0X40 135 (BALLOONS) ×2
BALLN MUSTANG 6.0X40 135 (BALLOONS) ×2
BALLN STERLING RX 5X40X80 (BALLOONS) ×4
BALLN STERLING RX 7X30X80 (BALLOONS) ×2
BALLOON MUSTANG 5.0X40 135 (BALLOONS) ×1 IMPLANT
BALLOON MUSTANG 6.0X40 135 (BALLOONS) ×1 IMPLANT
BALLOON STERLING RX 5X40X80 (BALLOONS) ×2 IMPLANT
BALLOON STERLING RX 7X30X80 (BALLOONS) ×1 IMPLANT
BANDAGE ESMARK 6X9 LF (GAUZE/BANDAGES/DRESSINGS) ×1 IMPLANT
BLADE CLIPPER SURG (BLADE) ×2 IMPLANT
BNDG ESMARK 6X9 LF (GAUZE/BANDAGES/DRESSINGS) ×2
CANISTER SUCT 3000ML PPV (MISCELLANEOUS) ×2 IMPLANT
CATH EMB 4FR 80CM (CATHETERS) ×6 IMPLANT
CATH EMB 5FR 80CM (CATHETERS) ×6 IMPLANT
CLIP VESOCCLUDE MED 24/CT (CLIP) ×2 IMPLANT
CLIP VESOCCLUDE SM WIDE 24/CT (CLIP) ×2 IMPLANT
COVER PROBE W GEL 5X96 (DRAPES) ×2 IMPLANT
CUFF TOURN SGL QUICK 18X4 (TOURNIQUET CUFF) IMPLANT
CUFF TOURN SGL QUICK 24 (TOURNIQUET CUFF) ×1
CUFF TOURN SGL QUICK 34 (TOURNIQUET CUFF)
CUFF TOURN SGL QUICK 42 (TOURNIQUET CUFF) IMPLANT
CUFF TRNQT CYL 24X4X16.5-23 (TOURNIQUET CUFF) ×1 IMPLANT
CUFF TRNQT CYL 34X4.125X (TOURNIQUET CUFF) IMPLANT
DERMABOND ADVANCED (GAUZE/BANDAGES/DRESSINGS) ×1
DERMABOND ADVANCED .7 DNX12 (GAUZE/BANDAGES/DRESSINGS) ×1 IMPLANT
DRAIN CHANNEL 15F RND FF W/TCR (WOUND CARE) IMPLANT
DRAPE C-ARM 42X72 X-RAY (DRAPES) ×2 IMPLANT
ELECT REM PT RETURN 9FT ADLT (ELECTROSURGICAL) ×4
ELECTRODE REM PT RTRN 9FT ADLT (ELECTROSURGICAL) ×2 IMPLANT
EVACUATOR SILICONE 100CC (DRAIN) IMPLANT
GAUZE 4X4 16PLY ~~LOC~~+RFID DBL (SPONGE) ×4 IMPLANT
GLOVE SRG 8 PF TXTR STRL LF DI (GLOVE) ×1 IMPLANT
GLOVE SURG ENC MOIS LTX SZ7.5 (GLOVE) ×2 IMPLANT
GLOVE SURG UNDER POLY LF SZ8 (GLOVE) ×1
GOWN STRL REUS W/ TWL LRG LVL3 (GOWN DISPOSABLE) ×2 IMPLANT
GOWN STRL REUS W/ TWL XL LVL3 (GOWN DISPOSABLE) ×2 IMPLANT
GOWN STRL REUS W/TWL LRG LVL3 (GOWN DISPOSABLE) ×2
GOWN STRL REUS W/TWL XL LVL3 (GOWN DISPOSABLE) ×2
GRAFT PROPATEN W/RING 6X80X60 (Vascular Products) ×2 IMPLANT
HEMOSTAT SPONGE AVITENE ULTRA (HEMOSTASIS) IMPLANT
INSERT FOGARTY SM (MISCELLANEOUS) IMPLANT
KIT BASIN OR (CUSTOM PROCEDURE TRAY) ×2 IMPLANT
KIT TURNOVER KIT B (KITS) ×2 IMPLANT
NS IRRIG 1000ML POUR BTL (IV SOLUTION) ×4 IMPLANT
PACK PERIPHERAL VASCULAR (CUSTOM PROCEDURE TRAY) ×2 IMPLANT
PAD ARMBOARD 7.5X6 YLW CONV (MISCELLANEOUS) ×4 IMPLANT
SPONGE T-LAP 18X18 ~~LOC~~+RFID (SPONGE) ×6 IMPLANT
STAPLER VISISTAT 35W (STAPLE) IMPLANT
SUT GORETEX 5 0 TT13 24 (SUTURE) IMPLANT
SUT GORETEX 6.0 TT13 (SUTURE) IMPLANT
SUT MNCRL AB 4-0 PS2 18 (SUTURE) ×6 IMPLANT
SUT PROLENE 5 0 C 1 24 (SUTURE) ×18 IMPLANT
SUT PROLENE 6 0 BV (SUTURE) ×6 IMPLANT
SUT PROLENE 7 0 BV 1 (SUTURE) IMPLANT
SUT SILK 2 0 PERMA HAND 18 BK (SUTURE) IMPLANT
SUT SILK 2 0 SH (SUTURE) ×2 IMPLANT
SUT SILK 3 0 (SUTURE)
SUT SILK 3-0 18XBRD TIE 12 (SUTURE) IMPLANT
SUT VIC AB 2-0 CT1 27 (SUTURE) ×3
SUT VIC AB 2-0 CT1 TAPERPNT 27 (SUTURE) ×3 IMPLANT
SUT VIC AB 3-0 SH 27 (SUTURE) ×3
SUT VIC AB 3-0 SH 27X BRD (SUTURE) ×3 IMPLANT
TOWEL GREEN STERILE (TOWEL DISPOSABLE) ×4 IMPLANT
TRAY FOLEY MTR SLVR 16FR STAT (SET/KITS/TRAYS/PACK) ×2 IMPLANT
TUBING EXTENTION W/L.L. (IV SETS) IMPLANT
UNDERPAD 30X36 HEAVY ABSORB (UNDERPADS AND DIAPERS) ×2 IMPLANT
WATER STERILE IRR 1000ML POUR (IV SOLUTION) ×2 IMPLANT

## 2020-12-31 NOTE — Anesthesia Postprocedure Evaluation (Signed)
Anesthesia Post Note  Patient: Ryan [name]  Procedure(s) Performed: Redo Exposure Left Common Femoral Artery (Left) Left Greater Saphenous Vein Harvest (Left) THROMBECTOMY OF LEFT COMMON FEMORAL TO BELOW KNEE POPLITEAL ARTERY BYPASS GRAFT (Left) LEFT JUMP GRAFT REVISION BELOW KNEE POPLITEAL TO PERONEAL (Left)     Patient location during evaluation: PACU Anesthesia Type: General Level of consciousness: awake and alert Pain management: pain level controlled Vital Signs Assessment: post-procedure vital signs reviewed and stable Respiratory status: spontaneous breathing, nonlabored ventilation, respiratory function stable and patient connected to nasal cannula oxygen Cardiovascular status: stable Postop Assessment: no apparent nausea or vomiting Anesthetic complications: no   No notable events documented.  Last Vitals:  Vitals:   12/31/20 1245 12/31/20 1300  BP: (!) 91/53 (!) 99/57  Pulse: 72 71  Resp: 14 15  Temp:    SpO2: 97% 98%    Last Pain:  Vitals:   12/31/20 1241  TempSrc:   PainSc: Asleep                 Beryle Lathe

## 2020-12-31 NOTE — H&P (Signed)
History and Physical Interval Note:  12/31/2020 7:31 AM  Ryan [name]  has presented today for surgery, with the diagnosis of PAD.  The various methods of treatment have been discussed with the patient and family. After consideration of risks, benefits and other options for treatment, the patient has consented to  Procedure(s): LEFT FEMORAL TO PERONEAL ARTERY BYPASS GRAFTING (Left) as a surgical intervention.  The patient's history has been reviewed, patient examined, no change in status, stable for surgery.  I have reviewed the patient's chart and labs.  Questions were answered to the patient's satisfaction.     Cephus Shelling  Patient name: Ryan [name] MRN: [id number]        DOB: 1950/09/13          Sex: male   REASON FOR VISIT: Discuss occluded left leg bypass   HPI: Ryan [name] is a 70 y.o. male with history of hypertension and tobacco abuse as well as peripheral vascular disease that presents to discuss occluded left leg bypass.     He initially had a left common femoral to below-knee pop bypass with propaten by Dr. Imogene Burn in 2018.  This subsequently occluded in 2019 and I performed a redo left common femoral to below-knee popliteal bypass with 6 mm ringed PTFE.  The redo bypass in 2019 lasted until about August 2021 when he presented with critical limb ischemia with an occluded bypass and underwent thrombolysis of the occluded left leg bypass.  This was treated with a proximal 7 x 40 Eluvia and a distal 5 x 60 Tigris on 09/23/2019.  Bypass subsequently occluded again in 2022 with another thrombolysis and then most recently performed thrombectomy of the bypass as well as tibial thrombectomy and removal of the stent across the distal anastomosis with a bovine patch on 06/16/2020.  The bypass occluded again in November 2022 and underwent thrombolysis with subsequent angioplasty of the peroneal and TP trunk with compromised outflow.  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  This lasted about 2 weeks after hospital discharge and then the bypass thrombosed again and most recently underwent angiogram with Dr. Karin Lieu.  Dr. Karin Lieu discussed a left peroneal bypass but this was deferred given he would not accept blood products with significant risk given re-do nature of surgery.  He was discharged home.  He remains on Eliquis.     Today states he is having significant pain in the left foot.  He is adamant about not losing his leg.         Past Medical History:  Diagnosis Date   Hypertension     PVD (peripheral vascular disease) [facility])             Past Surgical History:  Procedure Laterality Date   ABDOMINAL AORTAGRAM Left 12/13/2016    ABDOMINAL AORTOGRAM W/LOWER EXTREMITY/notes 12/13/2016   ABDOMINAL AORTOGRAM W/LOWER EXTREMITY N/A 11/28/2016    Procedure: ABDOMINAL AORTOGRAM W/LOWER EXTREMITY;  Surgeon: Yates Decamp, MD;  Location: [facility] INVASIVE CV LAB;  Service: Cardiovascular;  Laterality: N/A;  bilateral   ABDOMINAL AORTOGRAM W/LOWER EXTREMITY N/A 12/13/2016    Procedure: ABDOMINAL AORTOGRAM W/LOWER EXTREMITY;  Surgeon: Maeola Harman, MD;  Location: [facility] INVASIVE CV LAB;  Service: Cardiovascular;  Laterality: N/A;  unilater left leg   ABDOMINAL AORTOGRAM W/LOWER EXTREMITY N/A 01/09/2018    Procedure: ABDOMINAL AORTOGRAM W/LOWER EXTREMITY;  Surgeon: Cephus Shelling, MD;  Location: [facility] INVASIVE CV LAB;  Service: Cardiovascular;  Laterality: N/A;   ABDOMINAL AORTOGRAM  W/LOWER EXTREMITY Left 09/22/2019    Procedure: ABDOMINAL AORTOGRAM W/LOWER EXTREMITY;  Surgeon: Maeola Harman, MD;  Location: [facility] INVASIVE CV LAB;  Service: Cardiovascular;  Laterality: Left;   ABDOMINAL AORTOGRAM W/LOWER EXTREMITY Left 12/02/2020    Procedure: ABDOMINAL AORTOGRAM W/LOWER EXTREMITY;  Surgeon: Cephus Shelling, MD;  Location: [facility] CV [facility];  Service: Cardiovascular;  Laterality: Left;   ABDOMINAL AORTOGRAM W/LOWER EXTREMITY N/A 12/22/2020     Procedure: ABDOMINAL AORTOGRAM W/LOWER EXTREMITY;  Surgeon: Victorino Sparrow, MD;  Location: [facility] INVASIVE CV LAB;  Service: Cardiovascular;  Laterality: N/A;   EMBOLIZATION Left 12/03/2020    Procedure: EMBOLIZATION;  Surgeon: Leonie Douglas, MD;  Location: [facility] INVASIVE CV LAB;  Service: Cardiovascular;  Laterality: Left;   FEMORAL-POPLITEAL BYPASS GRAFT Left 12/14/2016    Procedure: BYPASS GRAFT COMMON FEMORAL- BELOW KNEE POPLITEAL ARTERY with Bovine Patch to Below the knee poplitieal artery.;  Surgeon: Fransisco Hertz, MD;  Location: [facility] OR;  Service: Vascular;  Laterality: Left;   FEMORAL-POPLITEAL BYPASS GRAFT Left 01/28/2018    Procedure: BYPASS GRAFT FEMORAL-BELOW KNEE POPLITEAL ARTERY REDO with propaten vascular graft removable ring;  Surgeon: Cephus Shelling, MD;  Location: [facility] OR;  Service: Vascular;  Laterality: Left;   FEMUR SURGERY Right 2009    pt. has a rod in that leg   LOWER EXTREMITY ANGIOGRAPHY Left 11/28/2016    Procedure: Lower Extremity Angiography;  Surgeon: Yates Decamp, MD;  Location: [facility] INVASIVE CV LAB;  Service: Cardiovascular;  Laterality: Left;  lysis followup lower leg   PATCH ANGIOPLASTY Left 01/28/2018    Procedure: PATCH ANGIOPLASTY USING Livia Snellen BIOLOGIC PATCH;  Surgeon: Cephus Shelling, MD;  Location: [facility] OR;  Service: Vascular;  Laterality: Left;   PATCH ANGIOPLASTY Left 06/16/2020    Procedure: PATCH ANGIOPLASTY OF LEFT BELOW KNEE POPITEAL ARTERY;  Surgeon: Cephus Shelling, MD;  Location: [facility] OR;  Service: Vascular;  Laterality: Left;   PERIPHERAL VASCULAR BALLOON ANGIOPLASTY   11/28/2016    Procedure: PERIPHERAL VASCULAR BALLOON ANGIOPLASTY;  Surgeon: Yates Decamp, MD;  Location: [facility];  Service: Cardiovascular;;  SFA   PERIPHERAL VASCULAR BALLOON ANGIOPLASTY   12/03/2020    Procedure: PERIPHERAL VASCULAR BALLOON ANGIOPLASTY;  Surgeon: Leonie Douglas, MD;  Location: [facility];  Service: Cardiovascular;;  TP Trunk and Peroneal    PERIPHERAL VASCULAR CATHETERIZATION N/A 08/11/2014    Procedure: Lower Extremity Angiography;  Surgeon: Yates Decamp, MD;  Location: [facility] INVASIVE CV LAB;  Service: Cardiovascular;  Laterality: N/A;   PERIPHERAL VASCULAR INTERVENTION Left 09/22/2019    Procedure: PERIPHERAL VASCULAR INTERVENTION;  Surgeon: Maeola Harman, MD;  Location: [facility] INVASIVE CV LAB;  Service: Cardiovascular;  Laterality: Left;   PERIPHERAL VASCULAR INTERVENTION Left 09/23/2019    Procedure: PERIPHERAL VASCULAR INTERVENTION;  Surgeon: Nada Libman, MD;  Location: [facility];  Service: Cardiovascular;  Laterality: Left;   PERIPHERAL VASCULAR INTERVENTION Left 05/13/2020    Procedure: PERIPHERAL VASCULAR INTERVENTION;  Surgeon: Cephus Shelling, MD;  Location: [facility];  Service: Cardiovascular;  Laterality: Left;   PERIPHERAL VASCULAR INTERVENTION Left 12/02/2020    Procedure: PERIPHERAL VASCULAR INTERVENTION;  Surgeon: Cephus Shelling, MD;  Location: [facility];  Service: Cardiovascular;  Laterality: Left;   PERIPHERAL VASCULAR INTERVENTION   12/03/2020    Procedure: PERIPHERAL VASCULAR INTERVENTION;  Surgeon: Leonie Douglas, MD;  Location: [facility];  Service: Cardiovascular;;   PERIPHERAL VASCULAR INTERVENTION Left 12/22/2020    Procedure:  PERIPHERAL VASCULAR INTERVENTION;  Surgeon: Victorino Sparrow, MD;  Location: [facility] INVASIVE CV LAB;  Service: Cardiovascular;  Laterality: Left;   PERIPHERAL VASCULAR THROMBECTOMY   11/28/2016    Procedure: PERIPHERAL VASCULAR THROMBECTOMY;  Surgeon: Yates Decamp, MD;  [facility];  Service: Cardiovascular;;  Profunda   PERIPHERAL VASCULAR THROMBECTOMY   11/28/2016    Procedure: PERIPHERAL VASCULAR THROMBECTOMY;  Surgeon: Yates Decamp, MD;  Location: [facility];  Service: Cardiovascular;;   PERIPHERAL VASCULAR THROMBECTOMY N/A 05/12/2020    Procedure: PERIPHERAL VASCULAR THROMBECTOMY-Left Leg;  Surgeon: Leonie Douglas,  MD;  Location: [facility];  Service: Cardiovascular;  Laterality: N/A;   PERIPHERAL VASCULAR THROMBECTOMY N/A 05/13/2020    Procedure: PERIPHERAL VASCULAR THROMBECTOMY- LYSIS RECHECK;  Surgeon: Cephus Shelling, MD;  Location: [facility];  Service: Cardiovascular;  Laterality: N/A;   PERIPHERAL VASCULAR THROMBECTOMY N/A 12/02/2020    Procedure: PERIPHERAL VASCULAR THROMBECTOMY;  Surgeon: Cephus Shelling, MD;  Location: [facility];  Service: Cardiovascular;  Laterality: N/A;   PERIPHERAL VASCULAR THROMBECTOMY N/A 12/03/2020    Procedure: LYSIS RECHECK;  Surgeon: Leonie Douglas, MD;  Location: [facility];  Service: Cardiovascular;  Laterality: N/A;   PULMONARY THROMBECTOMY N/A 09/23/2019    Procedure: LYSIS RECHECK;  Surgeon: Nada Libman, MD;  Location: [facility];  Service: Cardiovascular;  Laterality: N/A;   THROMBECTOMY FEMORAL ARTERY Left 12/14/2016    Procedure: THROMBECTOMY PROFUNDA FEMORAL ARTERY;  Surgeon: Fransisco Hertz, MD;  Location: [facility] OR;  Service: Vascular;  Laterality: Left;   THROMBECTOMY FEMORAL ARTERY Left 06/16/2020    Procedure: Redo exposure ot Left below knee popiteal artery;  Surgeon: Cephus Shelling, MD;  Location: [facility] OR;  Service: Vascular;  Laterality: Left;   THROMBECTOMY OF BYPASS GRAFT FEMORAL- POPLITEAL ARTERY Left 06/16/2020    Procedure: THROMBECTOMY OF BYPASS GRAFT FEMORAL-POPLITEAL ARTERY and TIBIAL ARTERY;  Surgeon: Cephus Shelling, MD;  Location: [facility] OR;  Service: Vascular;  Laterality: Left;           Family History  Problem Relation Age of Onset   Hypertension Mother     Diabetes Mother     Hypertension Father        SOCIAL HISTORY: Social History         Tobacco Use   Smoking status: Some Days      Packs/day: 0.75      Years: 56.00      Pack years: 42.00      Types: Cigarettes   Smokeless tobacco: Never   Tobacco comments:      "Haven't smoked since last surgery"  Substance Use Topics    Alcohol use: Yes      Alcohol/week: 10.0 standard drinks      Types: 10 Shots of liquor per week      Comment:  01/29/2018 "1-2 shots per night"      No Known Allergies         Current Outpatient Medications  Medication Sig Dispense Refill   apixaban (ELIQUIS) 5 MG TABS tablet Restart tomorrow 60 tablet 0   atenolol (TENORMIN) 50 MG tablet Take 50 mg by mouth in the morning.       clopidogrel (PLAVIX) 75 MG tablet TAKE 1 TABLET BY MOUTH EVERY DAY 90 tablet 3   losartan-hydrochlorothiazide (HYZAAR) 100-25 MG tablet Take 1 tablet by mouth in the morning.       rosuvastatin (CRESTOR) 20 MG tablet Take  20 mg by mouth at bedtime.       HYDROcodone-acetaminophen (NORCO) 5-325 MG tablet Take 1 tablet by mouth every 6 (six) hours as needed for moderate pain. (Patient not taking: Reported on 12/28/2020) 30 tablet 0    No current facility-administered medications for this visit.      REVIEW OF SYSTEMS:   denotes positive finding,  denotes negative finding Cardiac   Comments:  Chest pain or chest pressure:      Shortness of breath upon exertion:      Short of breath when lying flat:      Irregular heart rhythm:             Vascular      Pain in calf, thigh, or hip brought on by ambulation:      Pain in feet at night that wakes you up from your sleep:  x Left  Blood clot in your veins:      Leg swelling:              Pulmonary      Oxygen at home:      Productive cough:       Wheezing:              Neurologic      Sudden weakness in arms or legs:       Sudden numbness in arms or legs:       Sudden onset of difficulty speaking or slurred speech:      Temporary loss of vision in one eye:       Problems with dizziness:              Gastrointestinal      Blood in stool:       Vomited blood:              Genitourinary      Burning when urinating:       Blood in urine:             Psychiatric      Major depression:              Hematologic      Bleeding problems:       Problems with blood clotting too easily:             Skin      Rashes or ulcers:             Constitutional      Fever or chills:          PHYSICAL EXAM:    Vitals:    12/28/20 1034  BP: 132/70  Pulse: 65  Resp: 16  Temp: 98.2 F (36.8 C)  TempSrc: Temporal  SpO2: 97%  Weight: 140 lb (63.5 kg)  Height:  (1.676 m)      GENERAL: The patient is a well-nourished male, in no acute distress. The vital signs are documented above. CARDIAC: There is a regular rate and rhythm.  VASCULAR:  Palpable femoral pulses bilaterally Left groin and leg incision previously well-healed Left foot is cool to touch but motor or sensory intact   DATA:    ABI 12/20/2020 showed a dampened monophasic 0.22 with occluded left leg bypass   Assessment/Plan:   70 year old male presents with occluded redo left common femoral to below-knee popliteal prosthetic bypass.  This has undergone multiple previous attempts at thrombolysis as well as thrombectomy and distal patch of the distal anastomosis.  Most recently  underwent thrombolysis about 2 weeks ago and this occluded shortly after hospital discharge.  He only has compromised peroneal runoff at this time.     I have discussed that I think his best option moving forward would be a below-knee amputation given I think his options are not very good.  He has no vein for conduit.  He would require a redo redo bypass to the peroneal with prosthetic with compromised outflow.  He is adamantly refusing to consider an amputation.  States he would rather lose his life over his leg.  I discussed my concern about any attempts at redo bypass including the risk of significant bleeding and how he previously did not want blood products.  He now states he is amendable to blood transfusions.  I also discussed risk of anesthesia, risk of MI, risk of stroke, risk of bleeding, risk of infection, risk of poor durability of bypass, risk of wound problems, and even death.  He  ultimately wants to proceed with the peroneal bypass that was discussed by Dr. Karin Lieu last week.  I have stated that I think at best this may have 3 months of durability and may not even stay patent out of the operating room given compromised runoff.  He understands this and wishes to proceed.  I discussed I would cut down on the perineal target first and if this is not  sewable there would be nothing further to offer.  He is quickly running out of options and even if this peroneal bypass works he will be facing an amputation in the near future as I discussed with him today.   He is also still smoking even after many discussions in the past.   Cephus Shelling, MD [facility] [place] Office: [phone]   Cephus Shelling

## 2020-12-31 NOTE — Progress Notes (Signed)
Pt arrived from PACU to 4E15 after thrombectomy of left fem-pop bypass w/Dr. Chestine Spore.  Telemetry monitor applied and CCMD notified.  Pt oriented to unit and room to include call light and phone.  Skin assessment and CHG bath completed.  Will continue to monitor.

## 2020-12-31 NOTE — Transfer of Care (Signed)
Immediate Anesthesia Transfer of Care Note  Patient: Ryan [name]  Procedure(s) Performed: Redo Exposure Left Common Femoral Artery (Left) Left Greater Saphenous Vein Harvest (Left) THROMBECTOMY OF LEFT COMMON FEMORAL TO BELOW KNEE POPLITEAL ARTERY BYPASS GRAFT (Left) LEFT JUMP GRAFT REVISION BELOW KNEE POPLITEAL TO PERONEAL (Left)  Patient Location: PACU  Anesthesia Type:General  Level of Consciousness: awake, alert , patient cooperative and responds to stimulation  Airway & Oxygen Therapy: Patient Spontanous Breathing and Patient connected to face mask oxygen  Post-op Assessment: Report given to RN and Post -op Vital signs reviewed and stable  Post vital signs: Reviewed and stable  Last Vitals:  Vitals Value Taken Time  BP 109/58 12/31/20 1200  Temp    Pulse 70 12/31/20 1206  Resp 17 12/31/20 1206  SpO2 100 % 12/31/20 1206  Vitals shown include unvalidated device data.  Last Pain:  Vitals:   12/31/20 7867  TempSrc:   PainSc: 8          Complications: No notable events documented.

## 2020-12-31 NOTE — Anesthesia Procedure Notes (Signed)
Procedure Name: Intubation Date/Time: 12/31/2020 7:57 AM Performed by: Glynda Jaeger, CRNA Pre-anesthesia Checklist: Patient identified, Patient being monitored, Timeout performed, Emergency Drugs available and Suction available Patient Re-evaluated:Patient Re-evaluated prior to induction Oxygen Delivery Method: Circle System Utilized Preoxygenation: Pre-oxygenation with 100% oxygen Induction Type: IV induction Ventilation: Mask ventilation without difficulty Laryngoscope Size: Mac and 4 Grade View: Grade I Tube type: Oral Tube size: 7.5 mm Number of attempts: 1 Airway Equipment and Method: Stylet Placement Confirmation: ETT inserted through vocal cords under direct vision, positive ETCO2 and breath sounds checked- equal and bilateral Secured at: 21 cm Tube secured with: Tape Dental Injury: Teeth and Oropharynx as per pre-operative assessment

## 2020-12-31 NOTE — Op Note (Signed)
Date: December 31, 2020  Preoperative diagnosis: Acute on chronic ischemia of the left lower extremity with occluded redo common femoral to below-knee prosthetic bypass  Postoperative diagnosis: Same  Procedure: 1.  Redo exposure of left common femoral artery greater than 30 days 2.  Harvest of left leg great saphenous vein 3.  Thrombectomy of left common femoral artery to below-knee popliteal artery prosthetic bypass (including removal of proximal stent in bypass) 4.  Jump graft from the below-knee popliteal prosthetic bypass to the peroneal artery using saphenous vein interposition for composite bypass graft  Surgeon: Dr. Cephus Shelling, MD  Assistant: Wendi Maya, PA  Indications: Patient is a 70 year old male who has undergone multiple vascular interventions including a femoropopliteal bypass with prosthetic by Dr. Imogene Burn in the past and then a redo bypass by myself.  This has undergone multiple interventions to keep patency with thrombolysis as well as additional revisions in the OR with thrombectomy and patch angioplasty.  He has very limited options moving forward and only has peroneal runoff with an occluded bypass.  He presents today after we recommended primary amputation and patient wanted to proceed with all further attempts for limb salvage after risk and benefits discussed.  An assistant was needed for exposure and to expedite the case.  Findings: Initially re-exposed the distal bypass graft onto the below-knee popliteal artery as well as the peroneal artery in the mid calf through a medial approach which was the only target on recent angiogram.  After I transected the bypass off the native popliteal artery, I attempted to thrombectomize this with #4 and #5 Fogarty's as well as #6 and #7 Mustang balloons from popliteal approach but unfortunately there was a stent in the proximal bypass that was preventing [place] from getting the Fogarty and balloons to pass proximally.  Ultimately  after multiple attempts, I reopened the left common femoral artery and dissected out the common femoral as well as the profunda and the previous bypass.  I then opened the hood of the bypass and I could visualize the previous stent that was then removed and this had chronic thrombus within the stent that was near occlusive.  I then repaired the arteriotomy on hood of the bypass in the groin and we had excellent pulsatile flow distally.  I then performed a jump graft onto the peroneal artery using 6 mm ringed PTFE and reversed great saphenous vein for a composite bypass.  Brisk peroneal signal at completion.  Anesthesia: General  EBL: 1200 mL and patient received 2 units packed red blood cells and albumin in addition to crystalloid  Details: Patient was taken to the operating room after informed consent was obtained.  Placed on the operative table in the supine position and general endotracheal anesthesia was induced.  The left groin and left leg were then prepped and draped in usual sterile fashion.  Timeout was performed.  Antibiotics were given.  I initially reopened the popliteal incision on the medial calf of the left leg.  I extended this incision all the way down to the mid calf where the peroneal target was identified on recent arteriogram.  I used Meyerding retractors and then ultimately dissected out the peroneal artery after we reflected the gastrocnemius and soleus muscle and this appeared to be soft for anastomosis.  I then went up to the native popliteal artery and dissected out the previous prosthetic femoropopliteal bypass that was occluded.  Ultimately I transected this in half given there was no flow.  The distal stump  was oversewn with a 5-0 Prolene.  I then attempted to try and get pulsatile inflow in the old bypass graft and used #4 and #5 Fogarty's and ultimately I could not get the Fogarty's to pass proximal to the stent in the proximal bypass or if I did the balloon on the Fogarty would  rupture.  I elected to ultimately try Mustang balloons but these were too large and would not pass through the stent.  I then was able to get Sterling balloons which was smaller size ballooned using a 6 mm and 7 mm balloon through the stent  but unfortunately could not establish any reliable inflow and had only a trickle of flow in the bypass.  I then elected to reexplore the groin and I made a vertical groin incision at this previous incision.  Dissected down and got out the common femoral as well as the profunda and the hood of the bypass and all these were controlled.  Patient was then given 100 units/kg IV heparin.  I then controlled the common femoral with Henly clamp in the profunda with a baby profunda.  I opened the hood of the bypass 11 blade scalpel in longitudinal fashion.  I could visualize the stent and I removed under direct visualization.  This had a bunch of chronic thrombus that appeared flow-limiting within the stent.  I then passed a Fogarty down the bypass and retrieved no additional thrombus.  I elected to repair this arteriotomy in the hood of the bypass with a 5-0 Prolene running stitch.  I then had excellent pulsatile flow distally at the below knee popliteal artery.  I then used a 6 mm ringed PTFE graft and sewed an end to end anastomosis to the below-knee popliteal graft that was old in order to make a longer graft that would reach down to my peroneal target.  I then went up and and made a proximal medial thigh incision where we had identified saphenous vein and a short segment was dissected out and several sidebranches were ligated with 3-0 silk ties and divided.  This was harvested and ligated over right angle clamps.  I then brought the vein on the field in reversed fashion and this flushed and dilated nicely.  I then spatulated and a end-to-end anastomosis was sewn with 5-0 Prolene to the prosthetic bypass.  I then straighten the leg to the appropriate length a tourniquet was placed on  the upper thigh after exsanguinating the leg and then I opened the peroneal target with 11 blade scalpel extended with Potts scissors the vein was then spatulated and end to side anastomosis was sewn with 6-0 Prolene to the peroneal artery.  We had excellent Doppler flow in the peroneal distal to the bypass the was only present when the bypass was patent.  I was able to find a peroneal signal at the ankle.  We then gave protamine for reversal.  All the incisions were irrigated out.  They were closed in multiple areas of 2-0 Vicryl, 3-0 Vicryl, 4-0 Monocryl and Dermabond.  Taken to recovery in stable condition.  Complication: None  Condition: Stable  Cephus Shelling, MD [facility] [place] Office: [phone]   Cephus Shelling

## 2021-01-01 LAB — BASIC METABOLIC PANEL
Anion gap: 7 (ref 5–15)
BUN: 23 mg/dL (ref 8–23)
CO2: 21 mmol/L — ABNORMAL LOW (ref 22–32)
Calcium: 7.5 mg/dL — ABNORMAL LOW (ref 8.9–10.3)
Chloride: 106 mmol/L (ref 98–111)
Creatinine, Ser: 1.29 mg/dL — ABNORMAL HIGH (ref 0.61–1.24)
GFR, Estimated: 59 mL/min — ABNORMAL LOW (ref >60)
Glucose, Bld: 171 mg/dL — ABNORMAL HIGH (ref 70–99)
Potassium: 3.4 mmol/L — ABNORMAL LOW (ref 3.5–5.1)
Sodium: 134 mmol/L — ABNORMAL LOW (ref 135–145)

## 2021-01-01 LAB — CBC
HCT: 24.5 % — ABNORMAL LOW (ref 39.0–52.0)
Hemoglobin: 7.8 g/dL — ABNORMAL LOW (ref 13.0–17.0)
MCH: 27.3 pg (ref 26.0–34.0)
MCHC: 31.8 g/dL (ref 30.0–36.0)
MCV: 85.7 fL (ref 80.0–100.0)
Platelets: 160 10*3/uL (ref 150–400)
RBC: 2.86 MIL/uL — ABNORMAL LOW (ref 4.22–5.81)
RDW: 15.3 % (ref 11.5–15.5)
WBC: 8.1 10*3/uL (ref 4.0–10.5)
nRBC: 0 % (ref 0.0–0.2)

## 2021-01-01 LAB — LIPID PANEL
Cholesterol: 51 mg/dL (ref 0–200)
HDL: 13 mg/dL — ABNORMAL LOW (ref >40)
LDL Cholesterol: 17 mg/dL (ref 0–99)
Total CHOL/HDL Ratio: 3.9 RATIO
Triglycerides: 105 mg/dL (ref <150)
VLDL: 21 mg/dL (ref 0–40)

## 2021-01-01 NOTE — Progress Notes (Signed)
Mobility Specialist: Progress Note   01/01/21 1655  Mobility  Activity Stood at bedside  Level of Assistance Minimal assist, patient does 75% or more  Assistive Device Front wheel walker  Distance Ambulated (ft) 2 ft  Mobility  (Hop steps to [facility])  Mobility Response Tolerated well  Mobility performed by Mobility specialist;Other (comment) (PT Katie)  $Mobility charge 1 Mobility   Pt able to sit EOB and stand to take hop steps to [facility] with c/o 8/10 pain in his RLE. Pt back to bed with call bell at his side and bed alarm on.   [facility] Ryan [name] Mobility Specialist Mobility Specialist Phone #1: [phone] Mobility Specialist Phone #2: [phone]

## 2021-01-01 NOTE — Discharge Instructions (Signed)
 [facility] [place]  Discharge instructions  Lower Extremity Bypass Surgery  Please refer to the following instruction for your post-procedure care. Your surgeon or physician assistant will discuss any changes with you.  Activity  You are encouraged to walk as much as you can. You can slowly return to normal activities during the month after your surgery. Avoid strenuous activity and heavy lifting until your doctor tells you it's OK. Avoid activities such as vacuuming or swinging a golf club. Do not drive until your doctor give the OK and you are no longer taking prescription pain medications. It is also normal to have difficulty with sleep habits, eating and bowel movement after surgery. These will go away with time.  Bathing/Showering  Shower daily after you go home. Do not soak in a bathtub, hot tub, or swim until the incision heals completely.  Incision Care  Clean your incision with mild soap and water. Shower every day. Pat the area dry with a clean towel. You do not need a bandage unless otherwise instructed. Do not apply any ointments or creams to your incision. If you have open wounds you will be instructed how to care for them or a visiting nurse may be arranged for you. If you have staples or sutures along your incision they will be removed at your post-op appointment. You may have skin glue on your incision. Do not peel it off. It will come off on its own in about one week.  Wash the groin wound with soap and water daily and pat dry. (No tub bath-only shower)  Then put a dry gauze or washcloth in the groin to keep this area dry to help prevent wound infection.  Do this daily and as needed.  Do not use Vaseline or neosporin on your incisions.  Only use soap and water on your incisions and then protect and keep dry.  Diet  Resume your normal diet. There are no special food restrictions following this procedure. A low fat/ low cholesterol diet is  recommended for all patients with vascular disease. In order to heal from your surgery, it is CRITICAL to get adequate nutrition. Your body requires vitamins, minerals, and protein. Vegetables are the best source of vitamins and minerals. Vegetables also provide the perfect balance of protein. Processed food has little nutritional value, so try to avoid this.  Medications  Resume taking all your medications unless your doctor or physician assistant tells you not to. If your incision is causing pain, you may take over-the-counter pain relievers such as acetaminophen (Tylenol). If you were prescribed a stronger pain medication, please aware these medication can cause nausea and constipation. Prevent nausea by taking the medication with a snack or meal. Avoid constipation by drinking plenty of fluids and eating foods with high amount of fiber, such as fruits, vegetables, and grains. Take Colace 100 mg (an over-the-counter stool softener) twice a day as needed for constipation.  Do not take Tylenol if you are taking prescription pain medications.  Follow Up  Our office will schedule a follow up appointment 2-3 weeks following discharge.  Please call us immediately for any of the following conditions  Severe or worsening pain in your legs or feet while at rest or while walking Increase pain, redness, warmth, or drainage (pus) from your incision site(s) Fever of 101 degree or higher The swelling in your leg with the bypass suddenly worsens and becomes more painful than when you were in the hospital If you have   been instructed to feel your graft pulse then you should do so every day. If you can no longer feel this pulse, call the office immediately. Not all patients are given this instruction.  Leg swelling is common after leg bypass surgery.  The swelling should improve over a few months following surgery. To improve the swelling, you may elevate your legs above the level of your heart while you are  sitting or resting. Your surgeon or physician assistant may ask you to apply an ACE wrap or wear compression (TED) stockings to help to reduce swelling.  Reduce your risk of vascular disease  Stop smoking. If you would like help call [facility] at 1-800-QUIT-NOW (1-800-784-8669) or [facility] at 336-586-4000.  Manage your cholesterol Maintain a desired weight Control your diabetes weight Control your diabetes Keep your blood pressure down  If you have any questions, please call the office at [phone]  

## 2021-01-01 NOTE — Progress Notes (Addendum)
  Progress Note    01/01/2021 8:46 AM 1 Day Post-Op  Subjective:  eating breakfast.  Says he thinks his foot feels better  Tm 99.9 Hr 80's-90's  90's-100's systolic 96% RA  Vitals:   01/01/21 0700 01/01/21 0806  BP: (!) 104/58 (!) 105/50  Pulse:  93  Resp: 20 20  Temp:  99.9 F (37.7 C)  SpO2: 96% 96%    Physical Exam: Cardiac:  regular Lungs:  non labored Incisions:  left groin and left below knee incisions are clean and dry Extremities:  very brisk left AT/peroneal and PT doppler signals.  Unable to wiggle toes.  Unable to determine accurate sensory exam.  He says yes to touch even when not touching his foot.    CBC    Component Value Date/Time   WBC 8.1 01/01/2021 0138   RBC 2.86 (L) 01/01/2021 0138   HGB 7.8 (L) 01/01/2021 0138   HCT 24.5 (L) 01/01/2021 0138   PLT 160 01/01/2021 0138   MCV 85.7 01/01/2021 0138   MCH 27.3 01/01/2021 0138   MCHC 31.8 01/01/2021 0138   RDW 15.3 01/01/2021 0138    BMET    Component Value Date/Time   NA 134 (L) 01/01/2021 0138   K 3.4 (L) 01/01/2021 0138   CL 106 01/01/2021 0138   CO2 21 (L) 01/01/2021 0138   GLUCOSE 171 (H) 01/01/2021 0138   BUN 23 01/01/2021 0138   CREATININE 1.29 (H) 01/01/2021 0138   CALCIUM 7.5 (L) 01/01/2021 0138   GFRNONAA 59 (L) 01/01/2021 0138   GFRAA >60 09/23/2019 0455    INR    Component Value Date/Time   INR 1.1 12/31/2020 0552     Intake/Output Summary (Last 24 hours) at 01/01/2021 0846 Last data filed at 01/01/2021 0700 Gross per 24 hour  Intake 3080 ml  Output 2710 ml  Net 370 ml     Assessment/Plan:  70 y.o. male is s/p:  1.  Redo exposure of left common femoral artery greater than 30 days 2.  Harvest of left leg great saphenous vein 3.  Thrombectomy of left common femoral artery to below-knee popliteal artery prosthetic bypass (including removal of proximal stent in bypass) 4.  Jump graft from the below-knee popliteal prosthetic bypass to the peroneal artery using  saphenous vein interposition for composite bypass graft  1 Day Post-Op   -pt with brisk doppler signals left DP/peroneal/PT.  Unable to wiggle toes.  Unable to determine accurate sensory exam.  He says yes to touch even when not touching his foot.  Calf mildly tender to touch but very soft. -per Dr. Chestine Spore, pt does not need Eliquis or Plavix any longer-just asa and this has been started. -acute blood loss anemia-pt is tolerating -will need to work with PT/OT to determine disposition. -please keep dry gauze in left groin to wick moisture -DVT prophylaxis:  sq heparin   Doreatha Massed, PA-C [facility] [phone] 01/01/2021 8:46 AM  I have interviewed the patient and examined the patient. I agree with the findings by the PA.  He has good Doppler flow in the right foot.  His incisions look fine.  Cari Caraway, MD

## 2021-01-01 NOTE — Evaluation (Signed)
Physical Therapy Evaluation Patient Details Name: Ryan [name] MRN: [id number] DOB: 1950-05-22 Today's Date: 01/01/2021  History of Present Illness  The pt is a 70 yo male presenting 12/2 for L fem-peroneal artery bypass graft. PMH includes: HTN, PVD, and tobacco use.   Clinical Impression  Pt in bed upon arrival of PT, agreeable to evaluation at this time. Prior to admission the pt was mobilizing with use of a RW in the home, completing ADLs independently but slowly, and relying on family for IADLs. The pt now presents with limitations in functional mobility, strength, stability, and activity tolerance due to above dx, and will continue to benefit from skilled PT to address these deficits. The pt was agreeable to mobility, but limited by significant pain in LLE at this time. He required supervision only to get to EOB, but mina to complete sit-stand transfers and safely complete lateral hopping at EOB. Will continue to benefit from skilled PT to progress activity tolerance to allow for greater independence and reduced risk of falls with mobility in the home.         Recommendations for follow up therapy are one component of a multi-disciplinary discharge planning process, led by the attending physician.  Recommendations may be updated based on patient status, additional functional criteria and insurance authorization.  Follow Up Recommendations Home health PT    Assistance Recommended at Discharge Intermittent Supervision/Assistance  Functional Status Assessment Patient has had a recent decline in their functional status and demonstrates the ability to make significant improvements in function in a reasonable and predictable amount of time.   Equipment Recommendations  None recommended by PT (pt has needed DME)    Recommendations for Other Services       Precautions / Restrictions Precautions Precautions: Fall Restrictions Weight Bearing Restrictions: No Other Position/Activity  Restrictions: pt maintaining NWB LLE due to pain      Mobility  Bed Mobility Overal bed mobility: Needs Assistance Bed Mobility: Supine to Sit;Sit to Supine     Supine to sit: Supervision Sit to supine: Supervision   General bed mobility comments: pt managing LLE    Transfers Overall transfer level: Needs assistance Equipment used: Rolling walker (2 wheels) Transfers: Sit to/from Stand Sit to Stand: Min assist;+2 physical assistance           General transfer comment: mina to power up, pt keeping LLE NWB    Ambulation/Gait Ambulation/Gait assistance: Min assist;+2 safety/equipment Gait Distance (Feet): 3 Feet Assistive device: Rolling walker (2 wheels)   Gait velocity: decreased Gait velocity interpretation: <1.31 ft/sec, indicative of household ambulator   General Gait Details: lateral hopping with LLE NWB. good stability, minA to manage RW     Balance Overall balance assessment: Needs assistance Sitting-balance support: Feet supported Sitting balance-Leahy Scale: Good     Standing balance support: Bilateral upper extremity supported Standing balance-Leahy Scale: Fair Standing balance comment: good static stability, BUE support to keep LLE NWB                             Pertinent Vitals/Pain Pain Assessment: 0-10 Pain Score: 8  Pain Location: R leg, incision, toes Pain Descriptors / Indicators: Burning;Discomfort;Sharp Pain Intervention(s): Limited activity within patient's tolerance;Monitored during session;Repositioned    Home Living Family/patient expects to be discharged to:: Private residence Living Arrangements: Spouse/significant other Available Help at Discharge: Family;Available 24 hours/day Type of Home: House Home Access: Level entry       Home Layout: One  level Home Equipment: Agricultural consultant (2 wheels);Cane - single point;BSC/3in1      Prior Function Prior Level of Function : Independent/Modified Independent              Mobility Comments: uses RW for mobility       Hand Dominance   Dominant Hand: Right    Extremity/Trunk Assessment   Upper Extremity Assessment Upper Extremity Assessment: Overall WFL for tasks assessed    Lower Extremity Assessment Lower Extremity Assessment: LLE deficits/detail LLE Deficits / Details: limited by pain to, good ability to move against gravity. pt reports hypersensitive, burning. swelling and redness below R ankle LLE: Unable to fully assess due to pain    Cervical / Trunk Assessment Cervical / Trunk Assessment: Normal  Communication   Communication: No difficulties  Cognition Arousal/Alertness: Awake/alert Behavior During Therapy: WFL for tasks assessed/performed Overall Cognitive Status: Within Functional Limits for tasks assessed                                 General Comments: pt following all instructions well        General Comments General comments (skin integrity, edema, etc.): VSS on RA.    Exercises     Assessment/Plan    PT Assessment Patient needs continued PT services  PT Problem List Decreased strength;Decreased range of motion;Decreased activity tolerance;Decreased balance;Decreased mobility;Pain       PT Treatment Interventions DME instruction;Gait training;Stair training;Functional mobility training;Therapeutic activities;Therapeutic exercise;Balance training;Patient/family education    PT Goals (Current goals can be found in the Care Plan section)  Acute Rehab PT Goals Patient Stated Goal: reduce pain PT Goal Formulation: With patient Time For Goal Achievement: 01/15/21 Potential to Achieve Goals: Good    Frequency Min 3X/week    AM-PAC PT "6 Clicks" Mobility  Outcome Measure Help needed turning from your back to your side while in a flat bed without using bedrails?: None Help needed moving from lying on your back to sitting on the side of a flat bed without using bedrails?: A Little Help needed  moving to and from a bed to a chair (including a wheelchair)?: A Little Help needed standing up from a chair using your arms (e.g., wheelchair or bedside chair)?: A Little Help needed to walk in hospital room?: Total Help needed climbing 3-5 steps with a railing? : Total 6 Click Score: 15    End of Session Equipment Utilized During Treatment: Gait belt Activity Tolerance: Patient tolerated treatment well Patient left: in bed;with call bell/phone within reach;with bed alarm set Nurse Communication: Mobility status PT Visit Diagnosis: Unsteadiness on feet (R26.81);Other abnormalities of gait and mobility (R26.89);Muscle weakness (generalized) (M62.81);Pain Pain - Right/Left: Left Pain - part of body: Ankle and joints of foot;Leg    Time: 3474-2595 PT Time Calculation (min) (ACUTE ONLY): 31 min   Charges:   PT Evaluation $PT Eval Low Complexity: 1 Low PT Treatments $Gait Training: 8-22 mins        Vickki Muff, PT, DPT   Acute Rehabilitation Department Pager #: [phone]  Ryan [name] 01/01/2021, 6:13 PM

## 2021-01-01 NOTE — Progress Notes (Signed)
PHARMACIST LIPID MONITORING   Ryan [name] is a 70 y.o. male admitted on 12/31/2020 with occluded redo common femoral bypass.  Pharmacy has been consulted to optimize lipid-lowering therapy with the indication of secondary prevention for clinical ASCVD.  Recent Labs:  Lipid Panel (last 6 months):   Lab Results  Component Value Date   CHOL 51 01/01/2021   TRIG 105 01/01/2021   HDL 13 (L) 01/01/2021   CHOLHDL 3.9 01/01/2021   VLDL 21 01/01/2021   LDLCALC 17 01/01/2021    Hepatic function panel (last 6 months):   Lab Results  Component Value Date   AST 17 12/31/2020   ALT 12 12/31/2020   ALKPHOS 70 12/31/2020   BILITOT 0.7 12/31/2020    SCr (since admission):   Serum creatinine: 1.29 mg/dL (H) 83/09/40 7680 Estimated creatinine clearance: 46.5 mL/min (A)  Current therapy and lipid therapy tolerance Current lipid-lowering therapy: rosuvastatin 20mg  daily  Previous lipid-lowering therapies (if applicable): rosuvastatin 20mg  daily  Documented or reported allergies or intolerances to lipid-lowering therapies (if applicable): n/a  Assessment:   Patient with LDL well controlled on rosuvastatin 20mg  daily. No changes indicated at this time.   Plan:    1.Statin intensity (high intensity recommended for all patients regardless of the LDL):  No statin changes. The patient is already on a high intensity statin.  2.Add ezetimibe (if any one of the following):   Not indicated at this time.  3.Refer to lipid clinic:   No  4.Follow-up with:  Primary care provider - , MD  5.Follow-up labs after discharge:  No changes in lipid therapy, repeat a lipid panel in one year.       , PharmD, BCPS Clinical Pharmacist 01/01/2021 9:41 AM

## 2021-01-02 NOTE — Evaluation (Signed)
Occupational Therapy Evaluation Patient Details Name: Ryan [name] MRN: [id number] DOB: 08-15-1950 Today's Date: 01/02/2021   History of Present Illness The pt is a 70 yo male presenting 12/2 for L fem-peroneal artery bypass graft. PMH includes: HTN, PVD, and tobacco use.   Clinical Impression   Ryan [name] was evaluated s/p the above graft, PTA he was mod I for all mobility and ADLs. He lives in a 1 level home, level entry with his wife and son who are able to assist as needed upon d/c. Upon evaluation, pt is now significantly limites by pain and poor activity tolerance. He required increased time for all tasks assessed for pain management. Overall pt is supervision for bed mobility, min A for simple transfers and up to mod A for ADLs. He will benefit from OT acutely. Recommend d/c home with HHOT to progress function in the home setting.      Recommendations for follow up therapy are one component of a multi-disciplinary discharge planning process, led by the attending physician.  Recommendations may be updated based on patient status, additional functional criteria and insurance authorization.   Follow Up Recommendations  Home health OT    Assistance Recommended at Discharge Frequent or constant Supervision/Assistance  Functional Status Assessment  Patient has had a recent decline in their functional status and demonstrates the ability to make significant improvements in function in a reasonable and predictable amount of time.  Equipment Recommendations  None recommended by OT       Precautions / Restrictions Precautions Precautions: Fall Restrictions Weight Bearing Restrictions: No Other Position/Activity Restrictions: pt maintaining NWB LLE due to pain      Mobility Bed Mobility Overal bed mobility: Needs Assistance Bed Mobility: Supine to Sit     Supine to sit: Supervision     General bed mobility comments: for safety + significantly incrased time for pain management.     Transfers Overall transfer level: Needs assistance Equipment used: Rolling walker (2 wheels) Transfers: Sit to/from Stand;Bed to chair/wheelchair/BSC Sit to Stand: Min assist;From elevated surface Stand pivot transfers: Min assist         General transfer comment: pt keeping L foot off of floor and NWB due to pain. Min A to power into standing and ofr verbal cues.      Balance Overall balance assessment: Needs assistance Sitting-balance support: Feet supported Sitting balance-Leahy Scale: Good     Standing balance support: Bilateral upper extremity supported;During functional activity Standing balance-Leahy Scale: Poor                             ADL either performed or assessed with clinical judgement   ADL Overall ADL's : Needs assistance/impaired Eating/Feeding: Independent;Sitting   Grooming: Set up;Sitting   Upper Body Bathing: Set up;Sitting   Lower Body Bathing: Moderate assistance;Sit to/from stand   Upper Body Dressing : Set up;Sitting   Lower Body Dressing: Moderate assistance;Sit to/from stand Lower Body Dressing Details (indicate cue type and reason): pt unable to tolerate a sock on his LLE Toilet Transfer: Minimal assistance;Stand-pivot;BSC/3in1;Rolling walker (2 wheels) Toilet Transfer Details (indicate cue type and reason): limited to stand pivot only due to pain Toileting- Clothing Manipulation and Hygiene: Supervision/safety;Sitting/lateral lean       Functional mobility during ADLs: Minimal assistance;Cueing for safety;Rolling walker (2 wheels) General ADL Comments: pt limited by pain, unable to tolerate a sock on is LLE or the blanket touching his skin. Pt required significantly incrased time for all  LB tasks and mobility with RW     Vision Baseline Vision/History: 0 No visual deficits Ability to See in Adequate Light: 0 Adequate Patient Visual Report: No change from baseline Vision Assessment?: Vision impaired- to be further  tested in functional context      Pertinent Vitals/Pain Pain Assessment: 0-10 Pain Score: 7  Pain Location: L leg, distally Pain Descriptors / Indicators: Burning;Discomfort;Sharp Pain Intervention(s): Limited activity within patient's tolerance;Monitored during session     Hand Dominance Right   Extremity/Trunk Assessment Upper Extremity Assessment Upper Extremity Assessment: Overall WFL for tasks assessed   Lower Extremity Assessment Lower Extremity Assessment: Defer to PT evaluation   Cervical / Trunk Assessment Cervical / Trunk Assessment: Normal   Communication Communication Communication: No difficulties   Cognition Arousal/Alertness: Awake/alert Behavior During Therapy: WFL for tasks assessed/performed Overall Cognitive Status: Within Functional Limits for tasks assessed               General Comments: pt following all instructions well; wishes to stop smoking - knows it can impact the healing process     General Comments  VSS on RA, pts L foot has incrased edema, redness and skin tears     Home Living Family/patient expects to be discharged to:: Private residence Living Arrangements: Spouse/significant other Available Help at Discharge: Family;Available 24 hours/day Type of Home: House Home Access: Level entry     Home Layout: One level     Bathroom Shower/Tub: Tub/shower unit;Door   Bathroom Toilet: Handicapped height     Home Equipment: Agricultural consultant (2 wheels);Cane - single point;BSC/3in1          Prior Functioning/Environment Prior Level of Function : Independent/Modified Independent             Mobility Comments: uses RW for mobility          OT Problem List: Decreased strength;Decreased activity tolerance;Decreased range of motion;Impaired balance (sitting and/or standing);Decreased knowledge of use of DME or AE;Decreased safety awareness;Decreased knowledge of precautions;Pain      OT Treatment/Interventions: Self-care/ADL  training;Therapeutic exercise;Balance training;Patient/family education;Therapeutic activities;DME and/or AE instruction    OT Goals(Current goals can be found in the care plan section) Acute Rehab OT Goals Patient Stated Goal: less pain OT Goal Formulation: With patient Time For Goal Achievement: 01/16/21 Potential to Achieve Goals: Fair ADL Goals Pt Will Perform Grooming: with min guard assist;standing Pt Will Perform Lower Body Bathing: with supervision;sit to/from stand Pt Will Perform Lower Body Dressing: with supervision;sit to/from stand Pt Will Transfer to Toilet: with supervision;ambulating  OT Frequency: Min 2X/week    AM-PAC OT "6 Clicks" Daily Activity     Outcome Measure Help from another person eating meals?: None Help from another person taking care of personal grooming?: A Little Help from another person toileting, which includes using toliet, bedpan, or urinal?: A Little Help from another person bathing (including washing, rinsing, drying)?: A Lot Help from another person to put on and taking off regular upper body clothing?: None Help from another person to put on and taking off regular lower body clothing?: A Lot 6 Click Score: 18   End of Session Equipment Utilized During Treatment: Gait belt;Rolling walker (2 wheels) Nurse Communication: Mobility status (L foot integrity)  Activity Tolerance: Patient limited by pain Patient left: in chair;with call bell/phone within reach;with nursing/sitter in room  OT Visit Diagnosis: Unsteadiness on feet (R26.81);Other abnormalities of gait and mobility (R26.89);Muscle weakness (generalized) (M62.81);Pain  Time: 7654-6503 OT Time Calculation (min): 32 min Charges:  OT General Charges $OT Visit: 1 Visit OT Evaluation $OT Eval Moderate Complexity: 1 Mod OT Treatments $Therapeutic Activity: 8-22 mins   Naria Abbey A Orra Nolde 01/02/2021, 3:00 PM

## 2021-01-02 NOTE — Progress Notes (Signed)
   VASCULAR SURGERY ASSESSMENT & PLAN:   POD 2: THROMBECTOMY OF LEFT FEMPOP BYPASS WITH EXTENSION TO PERONEAL ARTERY USING VEIN GRAFT: His graft is patent.  He has good flow in the peroneal artery.  The wound on the left foot is stable.  His incisions look fine.  SUBJECTIVE:   Some pain in the left foot.  PHYSICAL EXAM:   Vitals:   01/02/21 0413 01/02/21 0800 01/02/21 0900 01/02/21 1000  BP: 106/61 103/76 131/63   Pulse: 89     Resp:  20 (!) 22 13  Temp: 98 F (36.7 C)     TempSrc: Oral     SpO2: 99% 100% 99% 98%  Weight:      Height:       His incisions look fine. He has a brisk peroneal signal with the Doppler. The wound on the left foot is stable.  LABS:   Lab Results  Component Value Date   WBC 8.1 01/01/2021   HGB 7.8 (L) 01/01/2021   HCT 24.5 (L) 01/01/2021   MCV 85.7 01/01/2021   PLT 160 01/01/2021   Lab Results  Component Value Date   CREATININE 1.29 (H) 01/01/2021   Lab Results  Component Value Date   INR 1.1 12/31/2020    PROBLEM LIST:    Principal Problem:   Peripheral artery disease (HCC) Active Problems:   PAD (peripheral artery disease) (HCC)   CURRENT MEDS:    sodium chloride   Intravenous Once   aspirin EC  81 mg Oral Q0600   atenolol  50 mg Oral Daily   docusate sodium  100 mg Oral Daily   heparin  5,000 Units Subcutaneous Q8H   hydrochlorothiazide  25 mg Oral Daily   losartan  100 mg Oral Daily   pantoprazole  40 mg Oral Daily   rosuvastatin  20 mg Oral QHS    Waverly Ferrari Office: [phone] 01/02/2021

## 2021-01-02 NOTE — Plan of Care (Signed)
  Problem: Health Behavior/Discharge Planning: Goal: Ability to manage health-related needs will improve Outcome: Progressing   Problem: Clinical Measurements: Goal: Will remain free from infection Outcome: Progressing   Problem: Activity: Goal: Risk for activity intolerance will decrease Outcome: Progressing   Problem: Nutrition: Goal: Adequate nutrition will be maintained Outcome: Progressing   

## 2021-01-03 ENCOUNTER — Encounter (HOSPITAL_COMMUNITY): Payer: Self-pay | Admitting: Vascular Surgery

## 2021-01-03 DIAGNOSIS — Z9889 Other specified postprocedural states: Secondary | ICD-10-CM

## 2021-01-03 LAB — BASIC METABOLIC PANEL
Anion gap: 3 — ABNORMAL LOW (ref 5–15)
BUN: 11 mg/dL (ref 8–23)
CO2: 21 mmol/L — ABNORMAL LOW (ref 22–32)
Calcium: 7.6 mg/dL — ABNORMAL LOW (ref 8.9–10.3)
Chloride: 112 mmol/L — ABNORMAL HIGH (ref 98–111)
Creatinine, Ser: 0.97 mg/dL (ref 0.61–1.24)
GFR, Estimated: >60 mL/min (ref >60)
Glucose, Bld: 138 mg/dL — ABNORMAL HIGH (ref 70–99)
Potassium: 2.9 mmol/L — ABNORMAL LOW (ref 3.5–5.1)
Sodium: 136 mmol/L (ref 135–145)

## 2021-01-03 LAB — CBC
HCT: 19.1 % — ABNORMAL LOW (ref 39.0–52.0)
Hemoglobin: 6.1 g/dL — CL (ref 13.0–17.0)
MCH: 27.4 pg (ref 26.0–34.0)
MCHC: 31.9 g/dL (ref 30.0–36.0)
MCV: 85.7 fL (ref 80.0–100.0)
Platelets: 250 10*3/uL (ref 150–400)
RBC: 2.23 MIL/uL — ABNORMAL LOW (ref 4.22–5.81)
RDW: 16 % — ABNORMAL HIGH (ref 11.5–15.5)
WBC: 7.4 10*3/uL (ref 4.0–10.5)
nRBC: 0 % (ref 0.0–0.2)

## 2021-01-03 LAB — POCT I-STAT EG7
Acid-base deficit: 7 mmol/L — ABNORMAL HIGH (ref 0.0–2.0)
Bicarbonate: 19.6 mmol/L — ABNORMAL LOW (ref 20.0–28.0)
Calcium, Ion: 1.11 mmol/L — ABNORMAL LOW (ref 1.15–1.40)
HCT: 32 % — ABNORMAL LOW (ref 39.0–52.0)
Hemoglobin: 10.9 g/dL — ABNORMAL LOW (ref 13.0–17.0)
O2 Saturation: 97 %
Potassium: 3.9 mmol/L (ref 3.5–5.1)
Sodium: 137 mmol/L (ref 135–145)
TCO2: 21 mmol/L — ABNORMAL LOW (ref 22–32)
pCO2, Ven: 43.8 mmHg — ABNORMAL LOW (ref 44.0–60.0)
pH, Ven: 7.258 (ref 7.250–7.430)
pO2, Ven: 110 mmHg — ABNORMAL HIGH (ref 32.0–45.0)

## 2021-01-03 LAB — PREPARE RBC (CROSSMATCH)

## 2021-01-03 LAB — HEMOGLOBIN AND HEMATOCRIT, BLOOD
HCT: 28.4 % — ABNORMAL LOW (ref 39.0–52.0)
Hemoglobin: 9.5 g/dL — ABNORMAL LOW (ref 13.0–17.0)

## 2021-01-03 MED ORDER — SODIUM CHLORIDE 0.9% IV SOLUTION: Freq: Once | INTRAVENOUS | Status: AC

## 2021-01-03 MED ORDER — POTASSIUM CHLORIDE CRYS ER 20 MEQ PO TBCR
40.0000 meq | EXTENDED_RELEASE_TABLET | Freq: Once | ORAL | Status: AC
  Administered 2021-01-03: 40 meq via ORAL
  Filled 2021-01-03: qty 2

## 2021-01-03 NOTE — Progress Notes (Addendum)
  Progress Note    01/03/2021 7:31 AM 3 Days Post-Op  Subjective:  still with some pain in the foot but better.  Feels he's getting close to going home.  Afebrile HR 60's-80's  80's-100's systolic 97% RA  Vitals:   01/02/21 2339 01/03/21 0431  BP: (!) 100/50 (!) 96/53  Pulse: 80 73  Resp: 17 16  Temp: 98.6 F (37 C) 98 F (36.7 C)  SpO2: 98% 100%    Physical Exam: Cardiac:  regular Lungs:  non labored Incisions:  left groin and left below knee incisions look good Extremities:  brisk left PT/peroneal > DP doppler signals   CBC    Component Value Date/Time   WBC 8.1 01/01/2021 0138   RBC 2.86 (L) 01/01/2021 0138   HGB 7.8 (L) 01/01/2021 0138   HCT 24.5 (L) 01/01/2021 0138   PLT 160 01/01/2021 0138   MCV 85.7 01/01/2021 0138   MCH 27.3 01/01/2021 0138   MCHC 31.8 01/01/2021 0138   RDW 15.3 01/01/2021 0138    BMET    Component Value Date/Time   NA 134 (L) 01/01/2021 0138   K 3.4 (L) 01/01/2021 0138   CL 106 01/01/2021 0138   CO2 21 (L) 01/01/2021 0138   GLUCOSE 171 (H) 01/01/2021 0138   BUN 23 01/01/2021 0138   CREATININE 1.29 (H) 01/01/2021 0138   CALCIUM 7.5 (L) 01/01/2021 0138   GFRNONAA 59 (L) 01/01/2021 0138   GFRAA >60 09/23/2019 0455    INR    Component Value Date/Time   INR 1.1 12/31/2020 0552     Intake/Output Summary (Last 24 hours) at 01/03/2021 0731 Last data filed at 01/03/2021 0322 Gross per 24 hour  Intake 2544.13 ml  Output 1000 ml  Net 1544.13 ml     Assessment/Plan:  70 y.o. male is s/p:  1.  Redo exposure of left common femoral artery greater than 30 days 2.  Harvest of left leg great saphenous vein 3.  Thrombectomy of left common femoral artery to below-knee popliteal artery prosthetic bypass (including removal of proximal stent in bypass) 4.  Jump graft from the below-knee popliteal prosthetic bypass to the peroneal artery using saphenous vein interposition for composite bypass graft   3 Days Post-Op   -pt with brisk  doppler signals left foot. -soft blood pressures-hgb 7.8 a couple of days ago.  Will check CBC today.  -PT recommending HHPT.  Will do face to face and HH orders.  May be ready for dc tomorrow.  -DVT prophylaxis:  sq heparin   Doreatha Massed, PA-C [facility] [phone] 01/03/2021 7:31 AM  I have seen and evaluated the patient. I agree with the PA note as documented above.  Brisk left peroneal signal and bypass remains patent after re-do bypass to peroneal with revision.  We will check labs today given soft blood pressure.  Feels he needs 1 more day.  Incisions look good.  Potential discharge tomorrow on aspirin.  PT says home health.  Cephus Shelling, MD [facility] [place] Office: [phone]

## 2021-01-03 NOTE — TOC Initial Note (Signed)
Transition of Care (TOC) - Initial/Assessment Note  [name] RN, BSN Transitions of Care Unit 4E- RN Case Manager See Treatment Team for direct phone #    Patient Details  Name: Ryan [name] MRN: [id number] Date of Birth: 06/03/1950  Transition of Care [facility]) CM/SW Contact:    Darrold Span, RN Phone Number: 01/03/2021, 1:09 PM  Clinical Narrative:                 Pt admitted from home s/p popliteal bypass, +COVID on admit. PTA pt was active with [facility] for HHRN/PT services- orders have been placed to resume HH services and add OT as well.  Have spoken with Amy at [facility] and confirmed OT can be added and they can resume [facility] services on discharge.   TOC to follow for any additional transition needs.   Expected Discharge Plan: Home w Home Health Services Barriers to Discharge: Continued Medical Work up   Patient Goals and CMS Choice    Resumption of [facility]    Expected Discharge Plan and Services Expected Discharge Plan: Home w Home Health Services   Discharge Planning Services: CM Consult Post Acute Care Choice: Home Health, Resumption of Svcs/PTA Provider Living arrangements for the past 2 months: Single Family Home                           HH Arranged: RN, PT, OT [facility] Agency: [facility] Date [facility] Agency Contacted: 01/03/21 Time HH Agency Contacted: 1309 Representative spoke with at [facility] Agency: Amy  Prior Living Arrangements/Services Living arrangements for the past 2 months: Single Family Home Lives with:: Spouse Patient language and need for interpreter reviewed:: Yes Do you feel safe going back to the place where you live?: Yes      Need for Family Participation in Patient Care: Yes (Comment) Care giver support system in place?: Yes (comment) Current home services: DME, Home PT, Home RN Criminal Activity/Legal Involvement Pertinent to Current Situation/Hospitalization: No - Comment as needed  Activities of Daily Living Home Assistive  Devices/Equipment: Wheelchair, Blood pressure cuff, Scales ADL Screening (condition at time of admission) Patient's cognitive ability adequate to safely complete daily activities?: Yes Is the patient deaf or have difficulty hearing?: No Does the patient have difficulty seeing, even when wearing glasses/contacts?: No Does the patient have difficulty concentrating, remembering, or making decisions?: No Patient able to express need for assistance with ADLs?: Yes Does the patient have difficulty dressing or bathing?: No Independently performs ADLs?: Yes (appropriate for developmental age) Does the patient have difficulty walking or climbing stairs?: Yes Weakness of Legs: None Weakness of Arms/Hands: None  Permission Sought/Granted                  Emotional Assessment Appearance:: Appears stated age     Orientation: : Oriented to Self, Oriented to Place, Oriented to  Time, Oriented to Situation Alcohol / Substance Use: Not Applicable Psych Involvement: No (comment)  Admission diagnosis:  Peripheral artery disease (HCC) [I73.9] PAD (peripheral artery disease) (HCC) [I73.9] Patient Active Problem List   Diagnosis Date Noted   Peripheral artery disease (HCC) 12/31/2020   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 [place] [address] Phone: [phone] Fax: [phone]  [id number] - [place], [place] - [address] AT [facility] [address] [place] [address] Phone: [phone] Fax: [phone]     Social Determinants of Health (SDOH) Interventions    Readmission Risk  Interventions No flowsheet data found.

## 2021-01-03 NOTE — Progress Notes (Signed)
Critical result: Hgb 6.1 Notified Samantha Rhyne PA Received orders to transfuse 2 units of PRBCs  Brooke Pace, RN

## 2021-01-03 NOTE — Plan of Care (Signed)
?  Problem: Health Behavior/Discharge Planning: ?Goal: Ability to manage health-related needs will improve ?Outcome: Progressing ?  ?Problem: Clinical Measurements: ?Goal: Will remain free from infection ?Outcome: Progressing ?Goal: Cardiovascular complication will be avoided ?Outcome: Progressing ?  ?Problem: Activity: ?Goal: Risk for activity intolerance will decrease ?Outcome: Progressing ?  ?Problem: Nutrition: ?Goal: Adequate nutrition will be maintained ?Outcome: Progressing ?  ?

## 2021-01-04 ENCOUNTER — Other Ambulatory Visit (HOSPITAL_COMMUNITY): Payer: Self-pay

## 2021-01-04 LAB — BASIC METABOLIC PANEL
Anion gap: 5 (ref 5–15)
BUN: 9 mg/dL (ref 8–23)
CO2: 19 mmol/L — ABNORMAL LOW (ref 22–32)
Calcium: 7.7 mg/dL — ABNORMAL LOW (ref 8.9–10.3)
Chloride: 111 mmol/L (ref 98–111)
Creatinine, Ser: 0.95 mg/dL (ref 0.61–1.24)
GFR, Estimated: >60 mL/min (ref >60)
Glucose, Bld: 109 mg/dL — ABNORMAL HIGH (ref 70–99)
Potassium: 3.3 mmol/L — ABNORMAL LOW (ref 3.5–5.1)
Sodium: 135 mmol/L (ref 135–145)

## 2021-01-04 LAB — TYPE AND SCREEN
ABO/RH(D): O POS
Antibody Screen: NEGATIVE
Unit division: 0
Unit division: 0
Unit division: 0
Unit division: 0

## 2021-01-04 LAB — BPAM RBC
Blood Product Expiration Date: 202212082359
Blood Product Expiration Date: 202212082359
Blood Product Expiration Date: 202212292359
Blood Product Expiration Date: 202212292359
ISSUE DATE / TIME: 202212020754
ISSUE DATE / TIME: 202212020754
ISSUE DATE / TIME: 202212051011
ISSUE DATE / TIME: 202212051515
Unit Type and Rh: 5100
Unit Type and Rh: 5100
Unit Type and Rh: 9500
Unit Type and Rh: 9500

## 2021-01-04 LAB — CBC
HCT: 27.5 % — ABNORMAL LOW (ref 39.0–52.0)
Hemoglobin: 9.2 g/dL — ABNORMAL LOW (ref 13.0–17.0)
MCH: 28.2 pg (ref 26.0–34.0)
MCHC: 33.5 g/dL (ref 30.0–36.0)
MCV: 84.4 fL (ref 80.0–100.0)
Platelets: 264 10*3/uL (ref 150–400)
RBC: 3.26 MIL/uL — ABNORMAL LOW (ref 4.22–5.81)
RDW: 15.6 % — ABNORMAL HIGH (ref 11.5–15.5)
WBC: 7.7 10*3/uL (ref 4.0–10.5)
nRBC: 0 % (ref 0.0–0.2)

## 2021-01-04 MED ORDER — HYDROCODONE-ACETAMINOPHEN 5-325 MG PO TABS
1.0000 | ORAL_TABLET | Freq: Four times a day (QID) | ORAL | 0 refills | Status: DC | PRN
  Filled 2021-01-04: qty 20, 5d supply, fill #0

## 2021-01-04 MED ORDER — POTASSIUM CHLORIDE CRYS ER 20 MEQ PO TBCR
40.0000 meq | EXTENDED_RELEASE_TABLET | Freq: Once | ORAL | Status: AC
  Administered 2021-01-04: 40 meq via ORAL
  Filled 2021-01-04: qty 2

## 2021-01-04 MED ORDER — ASPIRIN 81 MG PO TBEC: 81.0000 mg | DELAYED_RELEASE_TABLET | Freq: Every day | ORAL | Status: AC

## 2021-01-04 NOTE — Care Management Important Message (Signed)
Important Message  Patient Details  Name: Lamondre Wesche MRN: [id number] Date of Birth: 1950/08/07   Medicare Important Message Given:  Yes     Renie Ora 01/04/2021, 8:41 AM

## 2021-01-04 NOTE — TOC Transition Note (Signed)
Transition of Care (TOC) - CM/SW Discharge Note [name] RN, BSN Transitions of Care Unit 4E- RN Case Manager See Treatment Team for direct phone #    Patient Details  Name: Ryan [name] MRN: [id number] Date of Birth: Dec 16, 1950  Transition of Care [facility]) CM/SW Contact:  Darrold Span, RN Phone Number: 01/04/2021, 1:35 PM   Clinical Narrative:    Pt stable for transition home today, received msg from PT- pt will need wheelchair for home- call made to [employer] to check and see if pt eligible for coverage of wheelchair- Received call back from [employer] and pt is 100% covered for wheelchair.  Call made to patient in the room (+COVID) and informed him that wheelchair would be covered. Per pt he would prefer wheelchair to be delivered to home. Wife to provide transport home once everything is in place- CM will reach out to vascular for DME order-wheelchair. Also confirmed with pt that [facility] services will resume with [facility] - pt appreciative and confirms that he is agreeable.   1330-DME order has been placed for wheelchair- call made to [employer] for DME referral- Wheelchair to be delivered to home as per pt request, HH has been resumed with [facility]- they will follow up for restart of services- RN/PT/OT- Amy notified   Final next level of care: Home w Home Health Services Barriers to Discharge: Barriers Resolved   Patient Goals and CMS Choice Patient states their goals for this hospitalization and ongoing recovery are:: return home w/ wife CMS Medicare.gov Compare Post Acute Care list provided to:: Patient Choice offered to / list presented to : Patient  Discharge Placement               Home w/ [facility]        Discharge Plan and Services   Discharge Planning Services: CM Consult Post Acute Care Choice: Home Health, Resumption of Svcs/PTA Provider          DME Arranged: Wheelchair manual DME Agency: [employer] Date DME Agency Contacted: 01/04/21 Time DME Agency Contacted:  1335 Representative spoke with at DME Agency: sheila HH Arranged: RN, PT, OT HH Agency: [facility] Date [facility] Agency Contacted: 01/03/21 Time HH Agency Contacted: 1309 Representative spoke with at [facility] Agency: Amy  Social Determinants of Health (SDOH) Interventions     Readmission Risk Interventions Readmission Risk Prevention Plan 01/04/2021  Post Dischage Appt Complete  Medication Screening Complete  Transportation Screening Complete  Some recent data might be hidden

## 2021-01-04 NOTE — Progress Notes (Signed)
Pt given discharge instructions, medication lists, follow up appointments, and when to call the doctor.  Pt verbalizes understanding. Pt given signs and symptoms of infection. Pt transported to main entrance via wheelchair. TOC medications and cell phone with patient. Reddix Hoff, RN

## 2021-01-04 NOTE — Progress Notes (Signed)
   Durable Medical Equipment (From admission, onward)        Start     Ordered  01/04/21 1314  For home use only DME high strength lightweight manual wheelchair with seat cushion  Once      Comments: Patient suffers from tissue loss PAD which impairs their ability to perform daily activities like bathing, dressing, grooming, and toileting in the home.  A walker will not resolve  issue with performing activities of daily living. A wheelchair will allow patient to safely perform daily activities.Length of need Lifetime. (THEN ONE OF THESE TWO:) Patient self-propels the wheelchair while engaging in frequent activities such as meals and toileting which cannot be performed in a standard or lightweight wheelchair due to the weight of the chair. Accessories: elevating leg rests (ELRs), wheel locks, extensions and anti-tippers. Back cushion  01/04/21 1314

## 2021-01-04 NOTE — Progress Notes (Addendum)
[facility] [place]  Subjective  - He thinks he is ready to go home   Objective 123/60 60 98.1 F (36.7 C) (Oral) 13 98%  Intake/Output Summary (Last 24 hours) at 01/04/2021 0738 Last data filed at 01/04/2021 6314 Gross per 24 hour  Intake 3495.27 ml  Output 1900 ml  Net 1595.27 ml    Doppler PT. DP/peroneal Left groin and calf soft, incisions healing well Minimal left toe motor, sensation grossly intact Lungs non  labored breathing  Assessment/Planning: 70 y.o. male is s/p:  1.  Redo exposure of left common femoral artery greater than 30 days 2.  Harvest of left leg great saphenous vein 3.  Thrombectomy of left common femoral artery to below-knee popliteal artery prosthetic bypass (including removal of proximal stent in bypass) 4.  Jump graft from the below-knee popliteal prosthetic bypass to the peroneal artery using saphenous vein interposition for composite bypass graft   4 Days Post-Op  Good PT doppler flow Acute blood loss anemia s/p transfusions.  HGB 9.2 Home health arranged, no equipment needs F/U in our office in 2-3 weeks Encouraged mobility and elevation of the left LE when at rest  Mosetta Pigeon 01/04/2021 7:38 AM --  Laboratory Lab Results: Recent Labs    01/03/21 0807 01/03/21 2117 01/04/21 0353  WBC 7.4  --  7.7  HGB 6.1* 9.5* 9.2*  HCT 19.1* 28.4* 27.5*  PLT 250  --  264   BMET Recent Labs    01/03/21 0807 01/04/21 0353  NA 136 135  K 2.9* 3.3*  CL 112* 111  CO2 21* 19*  GLUCOSE 138* 109*  BUN 11 9  CREATININE 0.97 0.95  CALCIUM 7.6* 7.7*    COAG Lab Results  Component Value Date   INR 1.1 12/31/2020   INR 1.0 06/16/2020   INR 0.97 01/17/2018   No results found for: PTT   I have seen and evaluated the patient. I agree with the PA note as documented above.  Status post thrombectomy of left common femoral to below-knee popliteal bypass with jump graft to the peroneal artery.  He has a very  brisk peroneal signal on the left foot.  Overall is doing very well.  He had 2 units of blood yesterday with appropriate response and hemoglobin of 9.2 today.  Incisions look good.  Discussed plan for discharge today and follow-up in 2 to 3 weeks for incision checks.  Aspirin at discharge from my standpoint.  We will stop the DOAC and Plavix given this was for high risk previous bypass and he is now basically had a completely new bypass.  Home health therapy arranged.  Cephus Shelling, MD [facility] [place] Office: [phone]

## 2021-01-04 NOTE — Progress Notes (Signed)
Physical Therapy Treatment Patient Details Name: Ryan [name] MRN: [id number] DOB: [birth year] Today's Date: 01/04/2021   History of Present Illness Pt is a 70 yo male presenting 12/31/20 for L fem-peroneal artery bypass graft. PMH includes HTN, PVD, tobacco use.   PT Comments    Pt slowly progressing with mobility. Today's session focused on transfer and gait training with RW; pt opting to treat LLE as TDWB secondary to persistent, significant pain. Pt would benefit from w/c for home and community use secondary to mobility limitations. If to remain admitted, will continue to follow acutely.    Recommendations for follow up therapy are one component of a multi-disciplinary discharge planning process, led by the attending physician.  Recommendations may be updated based on patient status, additional functional criteria and insurance authorization.  Follow Up Recommendations  Home health PT     Assistance Recommended at Discharge Intermittent Supervision/Assistance  Equipment Recommendations  Wheelchair (measurements PT);Wheelchair cushion (measurements PT)    Recommendations for Other Services       Precautions / Restrictions Precautions Precautions: Fall Restrictions Other Position/Activity Restrictions: Pt with very little tolerance for WB through LLE; pt opting to maintain near-TDWB precautions for comfort     Mobility  Bed Mobility Overal bed mobility: Modified Independent Bed Mobility: Supine to Sit           General bed mobility comments: Increased time and effort secondary to pain, use of bed rail    Transfers Overall transfer level: Needs assistance Equipment used: Rolling walker (2 wheels) Transfers: Sit to/from Stand Sit to Stand: Min guard           General transfer comment: Increased time and effort secondary to L foot pain, no physical assist required    Ambulation/Gait Ambulation/Gait assistance: Min guard Gait Distance (Feet): 24 Feet Assistive  device: Rolling walker (2 wheels) Gait Pattern/deviations: Step-to pattern Gait velocity: Decreased     General Gait Details: Slow, antalgic gait with RW and intermittent min guard for safety; pt limited by c/o L foot pain, opting to maintain LLE TDWB, noted minimally incraesed WB with distance   Stairs Stairs:  (demonstrated technique to ascend single step backwards with RW; pt reports this is what he was doing at home)           Wheelchair Mobility    Modified Rankin (Stroke Patients Only)       Balance Overall balance assessment: Needs assistance Sitting-balance support: Feet supported;Feet unsupported Sitting balance-Leahy Scale: Good     Standing balance support: Bilateral upper extremity supported;During functional activity;Reliant on assistive device for balance Standing balance-Leahy Scale: Poor Standing balance comment: reliant on RW to offload painful LLE                            Cognition Arousal/Alertness: Awake/alert Behavior During Therapy: WFL for tasks assessed/performed Overall Cognitive Status: Within Functional Limits for tasks assessed                                 General Comments: WFL for simple tasks; requires redirection to current task as pt talkative, pt reports, "I enjoy hearing myself talk about this"        Exercises Other Exercises Other Exercises: minimal tolerance for L ankle pumps, could minimally work on calf/ankle stretch with WB while seated EOB    General Comments General comments (skin integrity, edema, etc.): pt preparing for  d/c home today - increased time discussing DME and assist needs; case manager notified of need for w/c; pt reports family able to provide necessary assist. Educ re: activity recommendations, edema control, importance of mobility, fall risk reduction      Pertinent Vitals/Pain Pain Assessment: Faces Faces Pain Scale: Hurts whole lot Pain Location: L lower leg/foot Pain  Descriptors / Indicators: Burning;Discomfort;Sharp;Guarding;Grimacing Pain Intervention(s): Monitored during session;Limited activity within patient's tolerance;Patient requesting pain meds-RN notified    Home Living                          Prior Function            PT Goals (current goals can now be found in the care plan section) Progress towards PT goals: Progressing toward goals    Frequency    Min 3X/week      PT Plan Current plan remains appropriate    Co-evaluation              AM-PAC PT "6 Clicks" Mobility   Outcome Measure  Help needed turning from your back to your side while in a flat bed without using bedrails?: None Help needed moving from lying on your back to sitting on the side of a flat bed without using bedrails?: A Little Help needed moving to and from a bed to a chair (including a wheelchair)?: A Little Help needed standing up from a chair using your arms (e.g., wheelchair or bedside chair)?: A Little Help needed to walk in hospital room?: A Little Help needed climbing 3-5 steps with a railing? : A Lot 6 Click Score: 18    End of Session   Activity Tolerance: Patient limited by pain Patient left: in bed;with call bell/phone within reach;with nursing/sitter in room Nurse Communication: Mobility status PT Visit Diagnosis: Unsteadiness on feet (R26.81);Other abnormalities of gait and mobility (R26.89);Muscle weakness (generalized) (M62.81);Pain Pain - Right/Left: Left Pain - part of body: Ankle and joints of foot;Leg     Time: 1025-1059 PT Time Calculation (min) (ACUTE ONLY): 34 min  Charges:  $Therapeutic Activity: 8-22 mins $Self Care/Home Management: 8-22                     Ina Homes, PT, DPT Acute Rehabilitation Services  Pager [phone] Office [phone]  Malachy Chamber 01/04/2021, 1:07 PM

## 2021-01-04 NOTE — Discharge Summary (Signed)
Discharge Summary     Ryan [name] 1950-09-05 70 y.o. male  [id number]  Admission Date: 12/31/2020  Discharge Date: 01/04/2021 Physician: No att. providers found  Admission Diagnosis: Peripheral artery disease (HCC) [I73.9] PAD (peripheral artery disease) (HCC) [I73.9]  HPI:   This is a 70 y.o. male with history of hypertension and tobacco abuse as well as peripheral vascular disease that presents to discuss occluded left leg bypass.     He initially had a left common femoral to below-knee pop bypass with propaten by Dr. Imogene Burn in 2018.  This subsequently occluded in 2019 and I performed a redo left common femoral to below-knee popliteal bypass with 6 mm ringed PTFE.  The redo bypass in 2019 lasted until about August 2021 when he presented with critical limb ischemia with an occluded bypass and underwent thrombolysis of the occluded left leg bypass.  This was treated with a proximal 7 x 40 Eluvia and a distal 5 x 60 Tigris on 09/23/2019.  Bypass subsequently occluded again in 2022 with another thrombolysis and then most recently performed thrombectomy of the bypass as well as tibial thrombectomy and removal of the stent across the distal anastomosis with a bovine patch on 06/16/2020.  The bypass occluded again in November 2022 and underwent thrombolysis with subsequent angioplasty of the peroneal and TP trunk with compromised outflow.  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  This lasted about 2 weeks after hospital discharge and then the bypass thrombosed again and most recently underwent angiogram with Dr. Karin Lieu.  Dr. Karin Lieu discussed a left peroneal bypass but this was deferred given he would not accept blood products with significant risk given re-do nature of surgery.  He was discharged home.  He remains on Eliquis.     Today states he is having significant pain in the left foot.  He is adamant about not losing his leg.    Hospital Course:  The  patient was admitted to the hospital and taken to the operating room on 12/31/2020 and underwent: 1.  Redo exposure of left common femoral artery greater than 30 days 2.  Harvest of left leg great saphenous vein 3.  Thrombectomy of left common femoral artery to below-knee popliteal artery prosthetic bypass (including removal of proximal stent in bypass) 4.  Jump graft from the below-knee popliteal prosthetic bypass to the peroneal artery using saphenous vein interposition for composite bypass graft  Findings: Initially re-exposed the distal bypass graft onto the below-knee popliteal artery as well as the peroneal artery in the mid calf through a medial approach which was the only target on recent angiogram.  After I transected the bypass off the native popliteal artery, I attempted to thrombectomize this with #4 and #5 Fogarty's as well as #6 and #7 Mustang balloons from popliteal approach but unfortunately there was a stent in the proximal bypass that was preventing [place] from getting the Fogarty and balloons to pass proximally.  Ultimately after multiple attempts, I reopened the left common femoral artery and dissected out the common femoral as well as the profunda and the previous bypass.  I then opened the hood of the bypass and I could visualize the previous stent that was then removed and this had chronic thrombus within the stent that was near occlusive.  I then repaired the arteriotomy on hood of the bypass in the groin and we had excellent pulsatile flow distally.  I then performed a jump graft onto the peroneal artery using 6 mm  ringed PTFE and reversed great saphenous vein for a composite bypass.  Brisk peroneal signal at completion.  The pt tolerated the procedure well and was transported to the PACU in good condition.   By POD 1, pt had brisk doppler signals left DP/pero/PT.  Per Dr. Chestine Spore, pt did not need Eliquis or Plavix and these were discontinued and he was started on asa.  He did have acute  blood loss anemia and was tolerating.    POD 3, pt did have acute blood loss anemia and CBC revealed hgb of 6.1.  he was transfused two units of PRBC's.  PT recommended HHPT.  POD 4, his hgb had risen to 9.2 with transfusion.  He was discharged home.  His incisions were healing.   CBC    Component Value Date/Time   WBC 7.7 01/04/2021 0353   RBC 3.26 (L) 01/04/2021 0353   HGB 9.2 (L) 01/04/2021 0353   HCT 27.5 (L) 01/04/2021 0353   PLT 264 01/04/2021 0353   MCV 84.4 01/04/2021 0353   MCH 28.2 01/04/2021 0353   MCHC 33.5 01/04/2021 0353   RDW 15.6 (H) 01/04/2021 0353    BMET    Component Value Date/Time   NA 135 01/04/2021 0353   K 3.3 (L) 01/04/2021 0353   CL 111 01/04/2021 0353   CO2 19 (L) 01/04/2021 0353   GLUCOSE 109 (H) 01/04/2021 0353   BUN 9 01/04/2021 0353   CREATININE 0.95 01/04/2021 0353   CALCIUM 7.7 (L) 01/04/2021 0353   GFRNONAA >60 01/04/2021 0353   GFRAA >60 09/23/2019 0455     Discharge Instructions     Call MD for:  redness, tenderness, or signs of infection (pain, swelling, bleeding, redness, odor or green/yellow discharge around incision site)   Complete by: As directed    Call MD for:  severe or increased pain, loss or decreased feeling  in affected limb(s)   Complete by: As directed    Call MD for:  temperature >100.5   Complete by: As directed    Discharge patient   Complete by: As directed    Discharge disposition: 01-Home or Self Care   Discharge patient date: 01/04/2021   Resume previous diet   Complete by: As directed        Discharge Diagnosis:  Peripheral artery disease (HCC) [I73.9] PAD (peripheral artery disease) (HCC) [I73.9]  Secondary Diagnosis: Patient Active Problem List   Diagnosis Date Noted   Peripheral artery disease (HCC) 12/31/2020   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   Thrombosis of femoro-popliteal bypass graft (HCC) 05/12/2020   Critical lower limb ischemia (HCC) 12/11/2016   Claudication  in peripheral vascular disease (HCC) 11/28/2016   HTN (hypertension) 10/09/2014   Tobacco use 10/09/2014   PAD (peripheral artery disease) (HCC)    Past Medical History:  Diagnosis Date   Hypertension    PVD (peripheral vascular disease) (HCC)      Allergies as of 01/04/2021   No Known Allergies      Medication List     STOP taking these medications    apixaban 5 MG Tabs tablet Commonly known as: Eliquis   clopidogrel 75 MG tablet Commonly known as: PLAVIX       TAKE these medications    aspirin 81 MG EC tablet Take 1 tablet (81 mg total) by mouth daily at 6 (six) AM. Swallow whole.   atenolol 50 MG tablet Commonly known as: TENORMIN Take 50 mg by mouth in the morning.  HYDROcodone-acetaminophen 5-325 MG tablet Commonly known as: Norco Take 1 tablet by mouth every 6 (six) hours as needed for moderate pain.   losartan-hydrochlorothiazide 100-25 MG tablet Commonly known as: HYZAAR Take 1 tablet by mouth in the morning.   rosuvastatin 20 MG tablet Commonly known as: CRESTOR Take 20 mg by mouth at bedtime.        Discharge Instructions: [facility] Discharge instructions Lower Extremity Bypass Surgery  Please refer to the following instruction for your post-procedure care. Your surgeon or physician assistant will discuss any changes with you.  Activity  You are encouraged to walk as much as you can. You can slowly return to normal activities during the month after your surgery. Avoid strenuous activity and heavy lifting until your doctor tells you it's OK. Avoid activities such as vacuuming or swinging a golf club. Do not drive until your doctor give the OK and you are no longer taking prescription pain medications. It is also normal to have difficulty with sleep habits, eating and bowel movement after surgery. These will go away with time.  Bathing/Showering  You may shower after you go home. Do not soak in a bathtub,  hot tub, or swim until the incision heals completely.  Incision Care  Clean your incision with mild soap and water. Shower every day. Pat the area dry with a clean towel. You do not need a bandage unless otherwise instructed. Do not apply any ointments or creams to your incision. If you have open wounds you will be instructed how to care for them or a visiting nurse may be arranged for you. If you have staples or sutures along your incision they will be removed at your post-op appointment. You may have skin glue on your incision. Do not peel it off. It will come off on its own in about one week.  Wash the groin wound with soap and water daily and pat dry. (No tub bath-only shower)  Then put a dry gauze or washcloth in the groin to keep this area dry to help prevent wound infection.  Do this daily and as needed.  Do not use Vaseline or neosporin on your incisions.  Only use soap and water on your incisions and then protect and keep dry.  Diet  Resume your normal diet. There are no special food restrictions following this procedure. A low fat/ low cholesterol diet is recommended for all patients with vascular disease. In order to heal from your surgery, it is CRITICAL to get adequate nutrition. Your body requires vitamins, minerals, and protein. Vegetables are the best source of vitamins and minerals. Vegetables also provide the perfect balance of protein. Processed food has little nutritional value, so try to avoid this.  Medications  Resume taking all your medications unless your doctor or Physician Assistant tells you not to. If your incision is causing pain, you may take over-the-counter pain relievers such as acetaminophen (Tylenol). If you were prescribed a stronger pain medication, please aware these medication can cause nausea and constipation. Prevent nausea by taking the medication with a snack or meal. Avoid constipation by drinking plenty of fluids and eating foods with high amount of fiber,  such as fruits, vegetables, and grains. Take Colace 100 mg (an over-the-counter stool softener) twice a day as needed for constipation.  Do not take Tylenol if you are taking prescription pain medications.  Follow Up  Our office will schedule a follow up appointment 2-3 weeks following discharge.  Please call  us immediately for any of the following conditions  Severe or worsening pain in your legs or feet while at rest or while walking Increase pain, redness, warmth, or drainage (pus) from your incision site(s) Fever of 101 degree or higher The swelling in your leg with the bypass suddenly worsens and becomes more painful than when you were in the hospital If you have been instructed to feel your graft pulse then you should do so every day. If you can no longer feel this pulse, call the office immediately. Not all patients are given this instruction.  Leg swelling is common after leg bypass surgery.  The swelling should improve over a few months following surgery. To improve the swelling, you may elevate your legs above the level of your heart while you are sitting or resting. Your surgeon or physician assistant may ask you to apply an ACE wrap or wear compression (TED) stockings to help to reduce swelling.  Reduce your risk of vascular disease  Stop smoking. If you would like help call [facility] at 1-800-QUIT-NOW ((201)532-1973) or [place] at 256-502-7822.  Manage your cholesterol Maintain a desired weight Control your diabetes weight Control your diabetes Keep your blood pressure down  If you have any questions, please call the office at [phone]   Prescriptions given: 1.  Vicodin#20 No Refill (Discontinue Eliquis and Plavix)  Disposition: home with HHPT/OT  Patient's condition: is Good  Follow up: 1. [facility] in 2-3 weeks   Doreatha Massed, PA-C [facility] [phone] 01/05/2021  10:49 AM  - For VQI Registry use ---   Post-op:  Wound  infection: No  Graft infection: No  Transfusion: Yes    If yes, 4 units given New Arrhythmia: No Ipsilateral amputation: No, [ ]  Minor, [ ]  BKA, [ ]  AKA Discharge patency: [ ]  Primary, [ ]  Primary assisted, [x]  Secondary, [ ]  Occluded Patency judged by: [x ] Dopper only, [ ]  Palpable graft pulse, []  Palpable distal pulse, [ ]  ABI inc. > 0.15, [ ]  Duplex Discharge ABI: R not done, L not done  Complications: MI: No, [ ]  Troponin only, [ ]  EKG or Clinical CHF: No Resp failure:No, [ ]  Pneumonia, [ ]  Ventilator Chg in renal function: No, [ ]  Inc. Cr > 0.5, [ ]  Temp. Dialysis,  [ ]  Permanent dialysis Stroke: No, [ ]  Minor, [ ]  Major Return to OR: No  Reason for return to OR: [ ]  Bleeding, [ ]  Infection, [ ]  Thrombosis, [ ]  Revision  Discharge medications: Statin use:  yes ASA use:  yes Plavix use:  no Beta blocker use: yes CCB use:  No ACEI use:   no ARB use:  yes Coumadin use: no

## 2021-01-06 DIAGNOSIS — I998 Other disorder of circulatory system: Secondary | ICD-10-CM | POA: Diagnosis not present

## 2021-01-06 DIAGNOSIS — Z7901 Long term (current) use of anticoagulants: Secondary | ICD-10-CM | POA: Diagnosis not present

## 2021-01-06 DIAGNOSIS — I10 Essential (primary) hypertension: Secondary | ICD-10-CM | POA: Diagnosis not present

## 2021-01-06 DIAGNOSIS — Z720 Tobacco use: Secondary | ICD-10-CM | POA: Diagnosis not present

## 2021-01-06 DIAGNOSIS — Z7902 Long term (current) use of antithrombotics/antiplatelets: Secondary | ICD-10-CM | POA: Diagnosis not present

## 2021-01-06 DIAGNOSIS — I739 Peripheral vascular disease, unspecified: Secondary | ICD-10-CM | POA: Diagnosis not present

## 2021-01-06 DIAGNOSIS — E785 Hyperlipidemia, unspecified: Secondary | ICD-10-CM | POA: Diagnosis not present

## 2021-01-06 DIAGNOSIS — Z48812 Encounter for surgical aftercare following surgery on the circulatory system: Secondary | ICD-10-CM | POA: Diagnosis not present

## 2021-01-10 DIAGNOSIS — I998 Other disorder of circulatory system: Secondary | ICD-10-CM | POA: Diagnosis not present

## 2021-01-10 DIAGNOSIS — Z48812 Encounter for surgical aftercare following surgery on the circulatory system: Secondary | ICD-10-CM | POA: Diagnosis not present

## 2021-01-10 DIAGNOSIS — I10 Essential (primary) hypertension: Secondary | ICD-10-CM | POA: Diagnosis not present

## 2021-01-10 DIAGNOSIS — I739 Peripheral vascular disease, unspecified: Secondary | ICD-10-CM | POA: Diagnosis not present

## 2021-01-10 DIAGNOSIS — Z7901 Long term (current) use of anticoagulants: Secondary | ICD-10-CM | POA: Diagnosis not present

## 2021-01-10 DIAGNOSIS — Z7902 Long term (current) use of antithrombotics/antiplatelets: Secondary | ICD-10-CM | POA: Diagnosis not present

## 2021-01-11 ENCOUNTER — Encounter: Payer: Medicare Other | Admitting: Vascular Surgery

## 2021-01-11 ENCOUNTER — Encounter (HOSPITAL_COMMUNITY): Payer: Medicare Other

## 2021-01-12 ENCOUNTER — Ambulatory Visit (INDEPENDENT_AMBULATORY_CARE_PROVIDER_SITE_OTHER): Payer: Medicare Other | Admitting: Podiatry

## 2021-01-12 ENCOUNTER — Other Ambulatory Visit: Payer: Self-pay

## 2021-01-12 ENCOUNTER — Encounter: Payer: Self-pay | Admitting: Podiatry

## 2021-01-12 DIAGNOSIS — M79609 Pain in unspecified limb: Secondary | ICD-10-CM

## 2021-01-12 DIAGNOSIS — B351 Tinea unguium: Secondary | ICD-10-CM

## 2021-01-12 DIAGNOSIS — I739 Peripheral vascular disease, unspecified: Secondary | ICD-10-CM | POA: Diagnosis not present

## 2021-01-12 NOTE — Progress Notes (Signed)
°  Subjective:  Patient ID: Ryan [name], male    DOB: 1950/08/27,  MRN: [id number]  Chief Complaint  Patient presents with   Nail Problem    Nail trim    70 y.o. male returns for the above complaint.  Patient presents with thickened elongated dystrophic toenails x10.  Mild pain on palpation.  Patient unable to debride himself.  Hurts with ambulation.  He has history of peripheral vascular disease.  Objective:  There were no vitals filed for this visit. Podiatric Exam: Vascular: dorsalis pedis and posterior tibial pulses are very faintly bilateral. Capillary return is sluggish history of peripheral vascular disease Sensorium: Normal Semmes Weinstein monofilament test. Normal tactile sensation bilaterally. Nail Exam: Pt has thick disfigured discolored nails with subungual debris noted bilateral entire nail hallux through fifth toenails.  Pain on palpation to the nails. Ulcer Exam: There is no evidence of ulcer or pre-ulcerative changes or infection. Orthopedic Exam: Muscle tone and strength are WNL. No limitations in general ROM. No crepitus or effusions noted.  Skin: No Porokeratosis. No infection or ulcers    Assessment & Plan:   1. Pain due to onychomycosis of nail   2. PAD (peripheral artery disease) (HCC)     Patient was evaluated and treated and all questions answered.  Onychomycosis with pain  -Nails palliatively debrided as below. -Educated on self-care  Procedure: Nail Debridement Rationale: pain  Type of Debridement: manual, sharp debridement. Instrumentation: Nail nipper, rotary burr. Number of Nails: 10  Procedures and Treatment: Consent by patient was obtained for treatment procedures. The patient understood the discussion of treatment and procedures well. All questions were answered thoroughly reviewed. Debridement of mycotic and hypertrophic toenails, 1 through 5 bilateral and clearing of subungual debris. No ulceration, no infection noted.  Return Visit-Office  Procedure: Patient instructed to return to the office for a follow up visit 3 months for continued evaluation and treatment.  Nicholes Rough, DPM    Return in about 3 months (around 04/12/2021).

## 2021-01-17 ENCOUNTER — Other Ambulatory Visit: Payer: Self-pay

## 2021-01-17 ENCOUNTER — Ambulatory Visit (HOSPITAL_COMMUNITY)
Admission: RE | Admit: 2021-01-17 | Discharge: 2021-01-17 | Disposition: A | Discharge status: Home / Self Care | Payer: Medicare Other | Source: Ambulatory Visit | Attending: Physician Assistant | Admitting: Physician Assistant

## 2021-01-17 ENCOUNTER — Encounter (HOSPITAL_COMMUNITY): Payer: Self-pay

## 2021-01-17 ENCOUNTER — Telehealth: Payer: Self-pay

## 2021-01-17 ENCOUNTER — Ambulatory Visit (INDEPENDENT_AMBULATORY_CARE_PROVIDER_SITE_OTHER): Payer: Medicare Other | Admitting: Physician Assistant

## 2021-01-17 VITALS — BP 105/60 | HR 67 | Temp 97.7°F | Resp 20 | Ht 66.0 in | Wt 140.0 lb

## 2021-01-17 DIAGNOSIS — Z7901 Long term (current) use of anticoagulants: Secondary | ICD-10-CM | POA: Diagnosis not present

## 2021-01-17 DIAGNOSIS — Z7902 Long term (current) use of antithrombotics/antiplatelets: Secondary | ICD-10-CM | POA: Diagnosis not present

## 2021-01-17 DIAGNOSIS — I739 Peripheral vascular disease, unspecified: Secondary | ICD-10-CM

## 2021-01-17 DIAGNOSIS — I70229 Atherosclerosis of native arteries of extremities with rest pain, unspecified extremity: Secondary | ICD-10-CM

## 2021-01-17 DIAGNOSIS — Z48812 Encounter for surgical aftercare following surgery on the circulatory system: Secondary | ICD-10-CM | POA: Diagnosis not present

## 2021-01-17 DIAGNOSIS — I998 Other disorder of circulatory system: Secondary | ICD-10-CM | POA: Diagnosis not present

## 2021-01-17 DIAGNOSIS — I10 Essential (primary) hypertension: Secondary | ICD-10-CM | POA: Diagnosis not present

## 2021-01-17 MED ORDER — CEPHALEXIN 500 MG PO CAPS: 500.0000 mg | ORAL_CAPSULE | Freq: Two times a day (BID) | ORAL | 0 refills | Status: DC

## 2021-01-17 MED ORDER — HYDROCODONE-ACETAMINOPHEN 5-325 MG PO TABS: 1.0000 | ORAL_TABLET | Freq: Four times a day (QID) | ORAL | 0 refills | Status: DC | PRN

## 2021-01-17 NOTE — Progress Notes (Signed)
POST OPERATIVE OFFICE NOTE    CC:  F/u for surgery  HPI:  This is a 70 y.o. male who is s/p  1.  Redo exposure of left common femoral artery greater than 30 days 2.  Harvest of left leg great saphenous vein 3.  Thrombectomy of left common femoral artery to below-knee popliteal artery prosthetic bypass (including removal of proximal stent in bypass) 4.  Jump graft from the below-knee popliteal prosthetic bypass to the peroneal artery using saphenous vein interposition for composite bypass graft  on 12/31/2020 by Dr. Chestine Spore.    Indications for surgery:  Patient is a 70 year old male who has undergone multiple vascular interventions including a femoropopliteal bypass with prosthetic by Dr. Imogene Burn in the past and then a redo bypass by myself.  This has undergone multiple interventions to keep patency with thrombolysis as well as additional revisions in the OR with thrombectomy and patch angioplasty.  He has very limited options moving forward and only has peroneal runoff with an occluded bypass.  He presents today after we recommended primary amputation and patient wanted to proceed with all further attempts for limb salvage after risk and benefits discussed.  [facility] called the office today to report the pt had a weakly palpable pulse in the left foot and the foot was painful.  Pt returns today for follow up and he is here with his wife.     Pt states he is having pain on the top of his foot where the sore is.  He states that the below knee incision is tight and tender.  He states it has not been draining.  He has not had any fevers.  He says that overall, the pain in the foot is better than before his surgery and it just tender over the wound.  He states one night he used alcohol to rub on his leg and his foot but he has not done that since.   He states he quit smoking in October.   No Known Allergies  Current Outpatient Medications  Medication Sig Dispense Refill   aspirin EC 81 MG EC tablet  Take 1 tablet (81 mg total) by mouth daily at 6 (six) AM. Swallow whole.     atenolol (TENORMIN) 50 MG tablet Take 50 mg by mouth in the morning.     HYDROcodone-acetaminophen (NORCO) 5-325 MG tablet Take 1 tablet by mouth every 6 (six) hours as needed for moderate pain. 20 tablet 0   losartan-hydrochlorothiazide (HYZAAR) 100-25 MG tablet Take 1 tablet by mouth in the morning.     rosuvastatin (CRESTOR) 20 MG tablet Take 20 mg by mouth at bedtime.     No current facility-administered medications for this visit.     ROS:  See HPI  Physical Exam:  Today's Vitals   01/17/21 1328  BP: 105/60  Pulse: 67  Resp: 20  Temp: 97.7 F (36.5 C)  SpO2: 98%  Weight: 140 lb (63.5 kg)  Height: 5\' 6"  (1.676 m)   Body mass index is 22.6 kg/m.   Incision:  below knee incision with small hematoma; very mild erythema.  No drainage.   Extremities:  pt with monophasic peroneal doppler signal; left calf is very soft and non tender to palpation.     Assessment/Plan:  This is a 70 y.o. male who is s/p: 1.  Redo exposure of left common femoral artery greater than 30 days 2.  Harvest of left leg great saphenous vein 3.  Thrombectomy of left common femoral  artery to below-knee popliteal artery prosthetic bypass (including removal of proximal stent in bypass) 4.  Jump graft from the below-knee popliteal prosthetic bypass to the peroneal artery using saphenous vein interposition for composite bypass graft  on 12/31/2020 by Dr. Chestine Spore.   -pt with +monophasic left peroneal doppler signal indicating his bypass is patent.   -there is some mild erythema around the distal below knee incision-I have sent Keflex 500mg  bid x 7 days to his pharmacy for this.   -he does have some pain over the lateral aspect of the dorsum of the foot.  This is dry and no evidence of infection.  Discussed it will take some time for this to heal.   -the pt has a follow up appt next week on 12/27.  He will keep that appt.  He and his  wife know to call sooner if he develops any purulent drainage or worsening left foot pain.  They are in agreement with this plan. -I also sent an Rx for Norco 5/325 one q6h prn pain #20 no refill to his pharmacy for pain on the ulcer at night. -he will continue his asa/statin -praised him for quitting smoking in October and encouraged him to to pick this habit back up .    November, [facility] [phone]   Clinic MD:  [phone] on call MD

## 2021-01-17 NOTE — Telephone Encounter (Signed)
Kim from [facility] calls today to report that patient has weakly palpable pulse in left foot. That toes are shiny and top of foot is painful, left lower extremity is tight and exquisitely painful. Advised patient that we could see him to determine if bypass was open - if it was down - he would have no further options and would have to be seen at the ED. I advised there may not be much we could do for him here since the last bypass was the last option - and that [facility] was out this week. Instructed I did not know if we would be able to provide any pain meds if the bypass was in fact open. Patient verbalizes understanding and would still like to come in today.Placed on schedule for duplex and evaluation.

## 2021-01-25 ENCOUNTER — Encounter (HOSPITAL_COMMUNITY): Payer: Medicare Other

## 2021-01-25 ENCOUNTER — Ambulatory Visit (INDEPENDENT_AMBULATORY_CARE_PROVIDER_SITE_OTHER): Payer: Medicare Other | Admitting: Physician Assistant

## 2021-01-25 ENCOUNTER — Other Ambulatory Visit: Payer: Self-pay

## 2021-01-25 ENCOUNTER — Ambulatory Visit: Payer: Medicare Other | Admitting: Vascular Surgery

## 2021-01-25 VITALS — BP 87/57 | HR 112 | Temp 98.0°F

## 2021-01-25 DIAGNOSIS — I70229 Atherosclerosis of native arteries of extremities with rest pain, unspecified extremity: Secondary | ICD-10-CM

## 2021-01-25 MED ORDER — HYDROCODONE-ACETAMINOPHEN 5-325 MG PO TABS: 1.0000 | ORAL_TABLET | Freq: Four times a day (QID) | ORAL | 0 refills | Status: DC | PRN

## 2021-01-25 MED ORDER — CEPHALEXIN 500 MG PO CAPS: 500.0000 mg | ORAL_CAPSULE | Freq: Two times a day (BID) | ORAL | 0 refills | Status: DC

## 2021-01-25 NOTE — Addendum Note (Signed)
Addended by: Dara Lords on: 01/25/2021 11:20 AM   Modules accepted: Orders

## 2021-01-25 NOTE — Progress Notes (Addendum)
POST OPERATIVE OFFICE NOTE    CC:  F/u for surgery  HPI:  This is a 70 y.o. male who is s/p  1.  Redo exposure of left common femoral artery greater than 30 days 2.  Harvest of left leg great saphenous vein 3.  Thrombectomy of left common femoral artery to below-knee popliteal artery prosthetic bypass (including removal of proximal stent in bypass) 4.  Jump graft from the below-knee popliteal prosthetic bypass to the peroneal artery using saphenous vein interposition for composite bypass graft  on 12/31/2020 by Dr. Chestine Spore.     Indications for surgery:  Patient is a 70 year old male who has undergone multiple vascular interventions including a femoropopliteal bypass with prosthetic by Dr. Imogene Burn in the past and then a redo bypass by myself.  This has undergone multiple interventions to keep patency with thrombolysis as well as additional revisions in the OR with thrombectomy and patch angioplasty.  He has very limited options moving forward and only has peroneal runoff with an occluded bypass.  He presents today after we recommended primary amputation and patient wanted to proceed with all further attempts for limb salvage after risk and benefits discussed.  He was seen a week ago as he was having pain on the top of the foot where the sore was and that his below knee incision was tender and tight but had not been draining.  He did not have any fevers and overall, the pain was better than before surgery.  He was given pain rx for night time pain to help him sleep and abx for some mild erythema around the below knee incision.    Pt returns today for follow up.  Pt states his left foot still has pain and is about the same.  He states the swelling around the incision is worse.  He has not had any fevers.  There has not really been any drainage from the incision.  He says his groin incision has healed.   No Known Allergies  Current Outpatient Medications  Medication Sig Dispense Refill   aspirin EC  81 MG EC tablet Take 1 tablet (81 mg total) by mouth daily at 6 (six) AM. Swallow whole.     atenolol (TENORMIN) 50 MG tablet Take 50 mg by mouth in the morning.     cephALEXin (KEFLEX) 500 MG capsule Take 1 capsule (500 mg total) by mouth 2 (two) times daily. 14 capsule 0   HYDROcodone-acetaminophen (NORCO) 5-325 MG tablet Take 1 tablet by mouth every 6 (six) hours as needed for moderate pain. 20 tablet 0   losartan-hydrochlorothiazide (HYZAAR) 100-25 MG tablet Take 1 tablet by mouth in the morning.     rosuvastatin (CRESTOR) 20 MG tablet Take 20 mg by mouth at bedtime.     No current facility-administered medications for this visit.     ROS:  See HPI  Physical Exam:  Today's Vitals   01/25/21 1037  BP: (!) 87/57  Pulse: (!) 112  Temp: 98 F (36.7 C)  TempSrc: Skin  SpO2: 99%  PainSc: 8    There is no height or weight on file to calculate BMI.   Incision:  left groin incision is clean and healed nicely.  The left below knee incision still with some mild erythema and probable small hematoma distal incision.     Extremities:  left dampened monophasic peroneal doppler signal.        Assessment/Plan:  This is a 70 y.o. male who is s/p: 1.  Redo  exposure of left common femoral artery greater than 30 days 2.  Harvest of left leg great saphenous vein 3.  Thrombectomy of left common femoral artery to below-knee popliteal artery prosthetic bypass (including removal of proximal stent in bypass) 4.  Jump graft from the below-knee popliteal prosthetic bypass to the peroneal artery using saphenous vein interposition for composite bypass graft  -pt with dampened monophasic left peroneal doppler signal.  This is not as good as it sounded last week.  Pt seen with Dr. Chestine Spore and bypass is most likely occluded and does not have enough blood flow to heal his ulcer on his foot.  His pain is not improved.  He is not willing to proceed with amputation at this time. -Dr. Chestine Spore discussed with pt  that he is concerned this wound is going to get worse and his only option would be for a below knee amputation.  Pt currently not willing to do this.  Dr. Chestine Spore has asked the pt to return in 2 weeks to his clinic for further discussions.  We will give him one more rx for pain medication but discussed with pt that we cannot continue to give pain medication.  -also has some erythema around the incision - will send another rx for Keflex. -pt will call sooner if he changes his mind and wants to proceed with amputation.  Prescriptions sent:  Norco 5/325 one po q6h prn pain #20 no refill  Keflex 500mg  po bid #20 no refill   , [facility] [phone]   Clinic MD:  [phone]

## 2021-02-08 ENCOUNTER — Ambulatory Visit: Payer: Medicare Other | Admitting: Vascular Surgery

## 2021-02-08 DIAGNOSIS — I70229 Atherosclerosis of native arteries of extremities with rest pain, unspecified extremity: Secondary | ICD-10-CM | POA: Diagnosis not present

## 2021-02-10 ENCOUNTER — Encounter (HOSPITAL_COMMUNITY): Payer: Self-pay | Admitting: Vascular Surgery

## 2021-02-10 ENCOUNTER — Other Ambulatory Visit: Payer: Self-pay

## 2021-02-10 ENCOUNTER — Telehealth: Payer: Self-pay | Admitting: Vascular Surgery

## 2021-02-10 DIAGNOSIS — Z419 Encounter for procedure for purposes other than remedying health state, unspecified: Secondary | ICD-10-CM

## 2021-02-10 DIAGNOSIS — I70222 Atherosclerosis of native arteries of extremities with rest pain, left leg: Secondary | ICD-10-CM

## 2021-02-10 NOTE — Progress Notes (Signed)
Spoke with pt's wife Talbert Forest for pre-op call. She states pt does not have a cardiac history, but his treated for HTN and PVD. Pt is not diabetic. Pt tested positive for Covid on 12/28/20 so no Covid test needed day of surgery.

## 2021-02-10 NOTE — Telephone Encounter (Signed)
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  We have previously recommended primary amputation after many failed procedures and he has refused.  He got a second opinion at [facility] who also agrees with primary amputation.  Patient would like left above-knee amputation and I agree this would give him the best chance of healing.  We previously discussed below-knee amputation as a compromise given he was refusing all consideration of amputation.  Discussed that we will get him scheduled for tomorrow and patient and wife are agreeable to proceed.  Plan left above knee amputation.  Cephus Shelling, MD [facility] [place] Office: [phone]   Cephus Shelling

## 2021-02-11 ENCOUNTER — Inpatient Hospital Stay (HOSPITAL_COMMUNITY): Payer: Medicare Other | Admitting: Certified Registered Nurse Anesthetist

## 2021-02-11 ENCOUNTER — Other Ambulatory Visit: Payer: Self-pay

## 2021-02-11 ENCOUNTER — Encounter (HOSPITAL_COMMUNITY)
Admission: RE | Disposition: A | Discharge status: Skilled Nursing Facility | Payer: Self-pay | Source: Ambulatory Visit | Attending: Vascular Surgery

## 2021-02-11 ENCOUNTER — Inpatient Hospital Stay (HOSPITAL_COMMUNITY)
Admission: RE | Admit: 2021-02-11 | Discharge: 2021-02-16 | DRG: 240 | Disposition: A | Discharge status: Skilled Nursing Facility | Payer: Medicare Other | Source: Ambulatory Visit | Attending: Vascular Surgery | Admitting: Vascular Surgery

## 2021-02-11 ENCOUNTER — Encounter (HOSPITAL_COMMUNITY): Payer: Self-pay | Admitting: Vascular Surgery

## 2021-02-11 DIAGNOSIS — E1165 Type 2 diabetes mellitus with hyperglycemia: Secondary | ICD-10-CM | POA: Diagnosis not present

## 2021-02-11 DIAGNOSIS — Z7401 Bed confinement status: Secondary | ICD-10-CM | POA: Diagnosis not present

## 2021-02-11 DIAGNOSIS — I10 Essential (primary) hypertension: Secondary | ICD-10-CM | POA: Diagnosis present

## 2021-02-11 DIAGNOSIS — E1152 Type 2 diabetes mellitus with diabetic peripheral angiopathy with gangrene: Secondary | ICD-10-CM | POA: Diagnosis not present

## 2021-02-11 DIAGNOSIS — I96 Gangrene, not elsewhere classified: Secondary | ICD-10-CM | POA: Diagnosis not present

## 2021-02-11 DIAGNOSIS — I70222 Atherosclerosis of native arteries of extremities with rest pain, left leg: Secondary | ICD-10-CM

## 2021-02-11 DIAGNOSIS — L97529 Non-pressure chronic ulcer of other part of left foot with unspecified severity: Secondary | ICD-10-CM | POA: Diagnosis not present

## 2021-02-11 DIAGNOSIS — Z7982 Long term (current) use of aspirin: Secondary | ICD-10-CM | POA: Diagnosis not present

## 2021-02-11 DIAGNOSIS — I739 Peripheral vascular disease, unspecified: Secondary | ICD-10-CM | POA: Diagnosis present

## 2021-02-11 DIAGNOSIS — R262 Difficulty in walking, not elsewhere classified: Secondary | ICD-10-CM | POA: Diagnosis present

## 2021-02-11 DIAGNOSIS — Z8616 Personal history of COVID-19: Secondary | ICD-10-CM | POA: Diagnosis not present

## 2021-02-11 DIAGNOSIS — T82868A Thrombosis of vascular prosthetic devices, implants and grafts, initial encounter: Secondary | ICD-10-CM | POA: Diagnosis not present

## 2021-02-11 DIAGNOSIS — Z4781 Encounter for orthopedic aftercare following surgical amputation: Secondary | ICD-10-CM | POA: Diagnosis not present

## 2021-02-11 DIAGNOSIS — Z79899 Other long term (current) drug therapy: Secondary | ICD-10-CM | POA: Diagnosis not present

## 2021-02-11 DIAGNOSIS — R1319 Other dysphagia: Secondary | ICD-10-CM | POA: Diagnosis present

## 2021-02-11 DIAGNOSIS — I82812 Embolism and thrombosis of superficial veins of left lower extremities: Secondary | ICD-10-CM | POA: Diagnosis not present

## 2021-02-11 DIAGNOSIS — I70362 Atherosclerosis of unspecified type of bypass graft(s) of the extremities with gangrene, left leg: Secondary | ICD-10-CM | POA: Diagnosis present

## 2021-02-11 DIAGNOSIS — Z89612 Acquired absence of left leg above knee: Secondary | ICD-10-CM | POA: Diagnosis not present

## 2021-02-11 DIAGNOSIS — M6281 Muscle weakness (generalized): Secondary | ICD-10-CM | POA: Diagnosis present

## 2021-02-11 DIAGNOSIS — E785 Hyperlipidemia, unspecified: Secondary | ICD-10-CM | POA: Diagnosis present

## 2021-02-11 DIAGNOSIS — E441 Mild protein-calorie malnutrition: Secondary | ICD-10-CM | POA: Diagnosis present

## 2021-02-11 DIAGNOSIS — I959 Hypotension, unspecified: Secondary | ICD-10-CM | POA: Diagnosis not present

## 2021-02-11 DIAGNOSIS — E118 Type 2 diabetes mellitus with unspecified complications: Secondary | ICD-10-CM | POA: Diagnosis present

## 2021-02-11 DIAGNOSIS — I70229 Atherosclerosis of native arteries of extremities with rest pain, unspecified extremity: Secondary | ICD-10-CM | POA: Diagnosis present

## 2021-02-11 DIAGNOSIS — Z419 Encounter for procedure for purposes other than remedying health state, unspecified: Secondary | ICD-10-CM

## 2021-02-11 DIAGNOSIS — M2550 Pain in unspecified joint: Secondary | ICD-10-CM | POA: Diagnosis not present

## 2021-02-11 HISTORY — PX: AMPUTATION: SHX166

## 2021-02-11 LAB — CBC
HCT: 27.5 % — ABNORMAL LOW (ref 39.0–52.0)
Hemoglobin: 8.7 g/dL — ABNORMAL LOW (ref 13.0–17.0)
MCH: 25.6 pg — ABNORMAL LOW (ref 26.0–34.0)
MCHC: 31.6 g/dL (ref 30.0–36.0)
MCV: 80.9 fL (ref 80.0–100.0)
Platelets: 620 10*3/uL — ABNORMAL HIGH (ref 150–400)
RBC: 3.4 MIL/uL — ABNORMAL LOW (ref 4.22–5.81)
RDW: 16 % — ABNORMAL HIGH (ref 11.5–15.5)
WBC: 22.8 10*3/uL — ABNORMAL HIGH (ref 4.0–10.5)
nRBC: 0 % (ref 0.0–0.2)

## 2021-02-11 LAB — COMPREHENSIVE METABOLIC PANEL
ALT: 19 U/L (ref 0–44)
AST: 29 U/L (ref 15–41)
Albumin: 2.6 g/dL — ABNORMAL LOW (ref 3.5–5.0)
Alkaline Phosphatase: 79 U/L (ref 38–126)
Anion gap: 14 (ref 5–15)
BUN: 55 mg/dL — ABNORMAL HIGH (ref 8–23)
CO2: 22 mmol/L (ref 22–32)
Calcium: 8.9 mg/dL (ref 8.9–10.3)
Chloride: 101 mmol/L (ref 98–111)
Creatinine, Ser: 1.7 mg/dL — ABNORMAL HIGH (ref 0.61–1.24)
GFR, Estimated: 42 mL/min — ABNORMAL LOW (ref >60)
Glucose, Bld: 172 mg/dL — ABNORMAL HIGH (ref 70–99)
Potassium: 2.1 mmol/L — CL (ref 3.5–5.1)
Sodium: 137 mmol/L (ref 135–145)
Total Bilirubin: 0.7 mg/dL (ref 0.3–1.2)
Total Protein: 7.4 g/dL (ref 6.5–8.1)

## 2021-02-11 LAB — APTT: aPTT: 36 seconds (ref 24–36)

## 2021-02-11 LAB — URINALYSIS, ROUTINE W REFLEX MICROSCOPIC
Bilirubin Urine: NEGATIVE
Glucose, UA: NEGATIVE mg/dL
Ketones, ur: NEGATIVE mg/dL
Leukocytes,Ua: NEGATIVE
Nitrite: NEGATIVE
Protein, ur: 100 mg/dL — AB
Specific Gravity, Urine: 1.015 (ref 1.005–1.030)
pH: 5 (ref 5.0–8.0)

## 2021-02-11 LAB — PROTIME-INR
INR: 1.1 (ref 0.8–1.2)
Prothrombin Time: 14.5 seconds (ref 11.4–15.2)

## 2021-02-11 SURGERY — AMPUTATION, ABOVE KNEE
Anesthesia: General | Site: Knee | Laterality: Left

## 2021-02-11 MED ORDER — PHENYLEPHRINE 40 MCG/ML (10ML) SYRINGE FOR IV PUSH (FOR BLOOD PRESSURE SUPPORT)
PREFILLED_SYRINGE | INTRAVENOUS | Status: AC
  Filled 2021-02-11: qty 10

## 2021-02-11 MED ORDER — DOCUSATE SODIUM 100 MG PO CAPS
100.0000 mg | ORAL_CAPSULE | Freq: Every day | ORAL | Status: DC
  Administered 2021-02-12 – 2021-02-16 (×5): 100 mg via ORAL
  Filled 2021-02-11 (×5): qty 1

## 2021-02-11 MED ORDER — HYDRALAZINE HCL 20 MG/ML IJ SOLN: 5.0000 mg | INTRAMUSCULAR | Status: DC | PRN

## 2021-02-11 MED ORDER — MAGNESIUM CITRATE PO SOLN
1.0000 | Freq: Once | ORAL | Status: DC | PRN
  Filled 2021-02-11: qty 296

## 2021-02-11 MED ORDER — SODIUM CHLORIDE 0.9 % IV SOLN: INTRAVENOUS | Status: DC

## 2021-02-11 MED ORDER — PROMETHAZINE HCL 25 MG/ML IJ SOLN: 6.2500 mg | INTRAMUSCULAR | Status: DC | PRN

## 2021-02-11 MED ORDER — POTASSIUM CHLORIDE CRYS ER 20 MEQ PO TBCR
40.0000 meq | EXTENDED_RELEASE_TABLET | ORAL | Status: AC
  Administered 2021-02-11 (×2): 40 meq via ORAL
  Filled 2021-02-11 (×2): qty 2

## 2021-02-11 MED ORDER — ATENOLOL 25 MG PO TABS
50.0000 mg | ORAL_TABLET | Freq: Every morning | ORAL | Status: DC
  Administered 2021-02-15: 50 mg via ORAL
  Filled 2021-02-11 (×4): qty 2

## 2021-02-11 MED ORDER — MIDAZOLAM HCL 2 MG/2ML IJ SOLN
INTRAMUSCULAR | Status: AC
  Filled 2021-02-11: qty 2

## 2021-02-11 MED ORDER — MAGNESIUM SULFATE 2 GM/50ML IV SOLN
2.0000 g | Freq: Every day | INTRAVENOUS | Status: AC | PRN
  Administered 2021-02-12: 2 g via INTRAVENOUS
  Filled 2021-02-11: qty 50

## 2021-02-11 MED ORDER — HYDROMORPHONE HCL 1 MG/ML IJ SOLN
0.2500 mg | INTRAMUSCULAR | Status: DC | PRN
  Administered 2021-02-11 (×4): 0.25 mg via INTRAVENOUS

## 2021-02-11 MED ORDER — MIDAZOLAM HCL 5 MG/5ML IJ SOLN
INTRAMUSCULAR | Status: DC | PRN
  Administered 2021-02-11: 2 mg via INTRAVENOUS

## 2021-02-11 MED ORDER — AMISULPRIDE (ANTIEMETIC) 5 MG/2ML IV SOLN: 10.0000 mg | Freq: Once | INTRAVENOUS | Status: DC | PRN

## 2021-02-11 MED ORDER — SENNOSIDES-DOCUSATE SODIUM 8.6-50 MG PO TABS: 1.0000 | ORAL_TABLET | Freq: Every evening | ORAL | Status: DC | PRN

## 2021-02-11 MED ORDER — EPHEDRINE 5 MG/ML INJ
INTRAVENOUS | Status: AC
  Filled 2021-02-11: qty 10

## 2021-02-11 MED ORDER — PHENOL 1.4 % MT LIQD: 1.0000 | OROMUCOSAL | Status: DC | PRN

## 2021-02-11 MED ORDER — HEPARIN SODIUM (PORCINE) 5000 UNIT/ML IJ SOLN
5000.0000 [IU] | Freq: Three times a day (TID) | INTRAMUSCULAR | Status: DC
  Administered 2021-02-11 – 2021-02-16 (×14): 5000 [IU] via SUBCUTANEOUS
  Filled 2021-02-11 (×14): qty 1

## 2021-02-11 MED ORDER — OXYCODONE HCL 5 MG PO TABS: 5.0000 mg | ORAL_TABLET | Freq: Once | ORAL | Status: DC | PRN

## 2021-02-11 MED ORDER — CEFAZOLIN SODIUM-DEXTROSE 2-4 GM/100ML-% IV SOLN
2.0000 g | INTRAVENOUS | Status: AC
  Administered 2021-02-11: 2 g via INTRAVENOUS
  Filled 2021-02-11: qty 100

## 2021-02-11 MED ORDER — DEXAMETHASONE SODIUM PHOSPHATE 10 MG/ML IJ SOLN
INTRAMUSCULAR | Status: DC | PRN
  Administered 2021-02-11: 5 mg via INTRAVENOUS

## 2021-02-11 MED ORDER — CHLORHEXIDINE GLUCONATE CLOTH 2 % EX PADS: 6.0000 | MEDICATED_PAD | Freq: Once | CUTANEOUS | Status: DC

## 2021-02-11 MED ORDER — POTASSIUM CHLORIDE CRYS ER 20 MEQ PO TBCR
20.0000 meq | EXTENDED_RELEASE_TABLET | Freq: Every day | ORAL | Status: AC | PRN
  Administered 2021-02-15: 40 meq via ORAL
  Filled 2021-02-11: qty 2

## 2021-02-11 MED ORDER — 0.9 % SODIUM CHLORIDE (POUR BTL) OPTIME
TOPICAL | Status: DC | PRN
  Administered 2021-02-11: 1000 mL

## 2021-02-11 MED ORDER — ALUM & MAG HYDROXIDE-SIMETH 200-200-20 MG/5ML PO SUSP: 15.0000 mL | ORAL | Status: DC | PRN

## 2021-02-11 MED ORDER — ASPIRIN EC 81 MG PO TBEC
81.0000 mg | DELAYED_RELEASE_TABLET | Freq: Every day | ORAL | Status: DC
  Administered 2021-02-12 – 2021-02-16 (×5): 81 mg via ORAL
  Filled 2021-02-11 (×5): qty 1

## 2021-02-11 MED ORDER — LOSARTAN POTASSIUM 50 MG PO TABS
100.0000 mg | ORAL_TABLET | Freq: Every day | ORAL | Status: DC
  Administered 2021-02-12 – 2021-02-15 (×4): 100 mg via ORAL
  Filled 2021-02-11 (×4): qty 2

## 2021-02-11 MED ORDER — LIDOCAINE 2% (20 MG/ML) 5 ML SYRINGE
INTRAMUSCULAR | Status: DC | PRN
  Administered 2021-02-11: 40 mg via INTRAVENOUS

## 2021-02-11 MED ORDER — FENTANYL CITRATE (PF) 100 MCG/2ML IJ SOLN
INTRAMUSCULAR | Status: DC | PRN
  Administered 2021-02-11: 50 ug via INTRAVENOUS

## 2021-02-11 MED ORDER — LACTATED RINGERS IV SOLN
INTRAVENOUS | Status: DC | PRN
Start: 2021-02-11 — End: 2021-02-11

## 2021-02-11 MED ORDER — DIPHENHYDRAMINE HCL 12.5 MG/5ML PO ELIX
12.5000 mg | ORAL_SOLUTION | ORAL | Status: DC | PRN
  Filled 2021-02-11: qty 10

## 2021-02-11 MED ORDER — ONDANSETRON HCL 4 MG/2ML IJ SOLN: 4.0000 mg | Freq: Four times a day (QID) | INTRAMUSCULAR | Status: DC | PRN

## 2021-02-11 MED ORDER — PROPOFOL 10 MG/ML IV BOLUS
INTRAVENOUS | Status: DC | PRN
Start: 2021-02-11 — End: 2021-02-11
  Administered 2021-02-11: 200 mg via INTRAVENOUS

## 2021-02-11 MED ORDER — CHLORHEXIDINE GLUCONATE 0.12 % MT SOLN
OROMUCOSAL | Status: AC
  Administered 2021-02-11: 15 mL via OROMUCOSAL
  Filled 2021-02-11: qty 15

## 2021-02-11 MED ORDER — OXYCODONE HCL 5 MG/5ML PO SOLN: 5.0000 mg | Freq: Once | ORAL | Status: DC | PRN

## 2021-02-11 MED ORDER — HYDROMORPHONE HCL 1 MG/ML IJ SOLN
INTRAMUSCULAR | Status: AC
  Filled 2021-02-11: qty 1

## 2021-02-11 MED ORDER — DEXAMETHASONE SODIUM PHOSPHATE 10 MG/ML IJ SOLN
INTRAMUSCULAR | Status: AC
  Filled 2021-02-11: qty 1

## 2021-02-11 MED ORDER — LIDOCAINE 2% (20 MG/ML) 5 ML SYRINGE
INTRAMUSCULAR | Status: AC
  Filled 2021-02-11: qty 5

## 2021-02-11 MED ORDER — HYDROCHLOROTHIAZIDE 25 MG PO TABS
25.0000 mg | ORAL_TABLET | Freq: Every day | ORAL | Status: DC
  Administered 2021-02-12 – 2021-02-15 (×4): 25 mg via ORAL
  Filled 2021-02-11 (×4): qty 1

## 2021-02-11 MED ORDER — PHENYLEPHRINE 40 MCG/ML (10ML) SYRINGE FOR IV PUSH (FOR BLOOD PRESSURE SUPPORT)
PREFILLED_SYRINGE | INTRAVENOUS | Status: DC | PRN
  Administered 2021-02-11: 120 ug via INTRAVENOUS
  Administered 2021-02-11: 80 ug via INTRAVENOUS
  Administered 2021-02-11: 120 ug via INTRAVENOUS
  Administered 2021-02-11: 80 ug via INTRAVENOUS

## 2021-02-11 MED ORDER — ONDANSETRON HCL 4 MG/2ML IJ SOLN
INTRAMUSCULAR | Status: AC
  Filled 2021-02-11: qty 2

## 2021-02-11 MED ORDER — LABETALOL HCL 5 MG/ML IV SOLN: 10.0000 mg | INTRAVENOUS | Status: DC | PRN

## 2021-02-11 MED ORDER — PANTOPRAZOLE SODIUM 40 MG PO TBEC
40.0000 mg | DELAYED_RELEASE_TABLET | Freq: Every day | ORAL | Status: DC
Start: 2021-02-11 — End: 2021-02-16
  Administered 2021-02-11 – 2021-02-16 (×6): 40 mg via ORAL
  Filled 2021-02-11 (×6): qty 1

## 2021-02-11 MED ORDER — PROPOFOL 10 MG/ML IV BOLUS
INTRAVENOUS | Status: AC
  Filled 2021-02-11: qty 20

## 2021-02-11 MED ORDER — LOSARTAN POTASSIUM-HCTZ 100-25 MG PO TABS: 1.0000 | ORAL_TABLET | Freq: Every morning | ORAL | Status: DC

## 2021-02-11 MED ORDER — ONDANSETRON HCL 4 MG/2ML IJ SOLN
INTRAMUSCULAR | Status: DC | PRN
Start: 2021-02-11 — End: 2021-02-11
  Administered 2021-02-11: 4 mg via INTRAVENOUS

## 2021-02-11 MED ORDER — GUAIFENESIN-DM 100-10 MG/5ML PO SYRP: 15.0000 mL | ORAL_SOLUTION | ORAL | Status: DC | PRN

## 2021-02-11 MED ORDER — ROSUVASTATIN CALCIUM 20 MG PO TABS
20.0000 mg | ORAL_TABLET | Freq: Every day | ORAL | Status: DC
  Administered 2021-02-11 – 2021-02-15 (×5): 20 mg via ORAL
  Filled 2021-02-11 (×5): qty 1

## 2021-02-11 MED ORDER — HYDROCODONE-ACETAMINOPHEN 5-325 MG PO TABS
1.0000 | ORAL_TABLET | ORAL | Status: DC | PRN
  Administered 2021-02-11 – 2021-02-13 (×7): 2 via ORAL
  Administered 2021-02-14: 1 via ORAL
  Administered 2021-02-15 (×2): 2 via ORAL
  Administered 2021-02-15 – 2021-02-16 (×2): 1 via ORAL
  Filled 2021-02-11: qty 2
  Filled 2021-02-11: qty 1
  Filled 2021-02-11: qty 2
  Filled 2021-02-11 (×2): qty 1
  Filled 2021-02-11 (×7): qty 2

## 2021-02-11 MED ORDER — BISACODYL 5 MG PO TBEC: 5.0000 mg | DELAYED_RELEASE_TABLET | Freq: Every day | ORAL | Status: DC | PRN

## 2021-02-11 MED ORDER — EPHEDRINE SULFATE-NACL 50-0.9 MG/10ML-% IV SOSY
PREFILLED_SYRINGE | INTRAVENOUS | Status: DC | PRN
  Administered 2021-02-11 (×2): 10 mg via INTRAVENOUS
  Administered 2021-02-11: 5 mg via INTRAVENOUS
  Administered 2021-02-11: 10 mg via INTRAVENOUS

## 2021-02-11 MED ORDER — ROCURONIUM BROMIDE 10 MG/ML (PF) SYRINGE
PREFILLED_SYRINGE | INTRAVENOUS | Status: AC
  Filled 2021-02-11: qty 10

## 2021-02-11 MED ORDER — FENTANYL CITRATE (PF) 250 MCG/5ML IJ SOLN
INTRAMUSCULAR | Status: AC
  Filled 2021-02-11: qty 5

## 2021-02-11 MED ORDER — METOPROLOL TARTRATE 5 MG/5ML IV SOLN: 2.0000 mg | INTRAVENOUS | Status: DC | PRN

## 2021-02-11 MED ORDER — CHLORHEXIDINE GLUCONATE 0.12 % MT SOLN
15.0000 mL | Freq: Once | OROMUCOSAL | Status: AC
  Filled 2021-02-11: qty 15

## 2021-02-11 SURGICAL SUPPLY — 57 items
BAG COUNTER SPONGE SURGICOUNT (BAG) ×2 IMPLANT
BANDAGE ESMARK 6X9 LF (GAUZE/BANDAGES/DRESSINGS) ×1 IMPLANT
BLADE SAW SAG 73X25 THK (BLADE) ×1
BLADE SAW SGTL 73X25 THK (BLADE) ×1 IMPLANT
BNDG COHESIVE 6X5 TAN STRL LF (GAUZE/BANDAGES/DRESSINGS) ×2 IMPLANT
BNDG ELASTIC 4X5.8 VLCR STR LF (GAUZE/BANDAGES/DRESSINGS) ×2 IMPLANT
BNDG ELASTIC 6X5.8 VLCR STR LF (GAUZE/BANDAGES/DRESSINGS) ×2 IMPLANT
BNDG ESMARK 6X9 LF (GAUZE/BANDAGES/DRESSINGS) ×2
BNDG GAUZE ELAST 4 BULKY (GAUZE/BANDAGES/DRESSINGS) ×4 IMPLANT
CANISTER SUCT 3000ML PPV (MISCELLANEOUS) ×2 IMPLANT
CLIP TI MEDIUM 6 (CLIP) ×1 IMPLANT
CLIP VESOCCLUDE MED 6/CT (CLIP) ×2 IMPLANT
COVER BACK TABLE 60X90IN (DRAPES) ×2 IMPLANT
COVER SURGICAL LIGHT HANDLE (MISCELLANEOUS) ×4 IMPLANT
DRAIN CHANNEL 19F RND (DRAIN) IMPLANT
DRAPE DERMATAC (DRAPES) IMPLANT
DRAPE HALF SHEET 40X57 (DRAPES) ×2 IMPLANT
DRAPE INCISE IOBAN 66X45 STRL (DRAPES) IMPLANT
DRAPE ORTHO SPLIT 77X108 STRL (DRAPES) ×2
DRAPE SURG ORHT 6 SPLT 77X108 (DRAPES) ×2 IMPLANT
DRAPE U-SHAPE 47X51 STRL (DRAPES) ×1 IMPLANT
DRESSING PREVENA PLUS CUSTOM (GAUZE/BANDAGES/DRESSINGS) IMPLANT
DRSG ADAPTIC 3X8 NADH LF (GAUZE/BANDAGES/DRESSINGS) ×2 IMPLANT
DRSG PREVENA PLUS CUSTOM (GAUZE/BANDAGES/DRESSINGS)
DRSG XEROFORM 1X8 (GAUZE/BANDAGES/DRESSINGS) ×1 IMPLANT
ELECT CAUTERY BLADE 6.4 (BLADE) ×2 IMPLANT
ELECT REM PT RETURN 9FT ADLT (ELECTROSURGICAL) ×2
ELECTRODE REM PT RTRN 9FT ADLT (ELECTROSURGICAL) ×1 IMPLANT
EVACUATOR SILICONE 100CC (DRAIN) IMPLANT
GAUZE SPONGE 4X4 12PLY STRL (GAUZE/BANDAGES/DRESSINGS) ×4 IMPLANT
GAUZE SPONGE 4X4 12PLY STRL LF (GAUZE/BANDAGES/DRESSINGS) ×1 IMPLANT
GLOVE SRG 8 PF TXTR STRL LF DI (GLOVE) ×1 IMPLANT
GLOVE SURG ENC MOIS LTX SZ7.5 (GLOVE) ×2 IMPLANT
GLOVE SURG UNDER POLY LF SZ8 (GLOVE) ×1
GOWN STRL REUS W/ TWL XL LVL3 (GOWN DISPOSABLE) ×1 IMPLANT
GOWN STRL REUS W/TWL XL LVL3 (GOWN DISPOSABLE) ×1
KIT BASIN OR (CUSTOM PROCEDURE TRAY) ×2 IMPLANT
KIT TURNOVER KIT B (KITS) ×2 IMPLANT
NS IRRIG 1000ML POUR BTL (IV SOLUTION) ×2 IMPLANT
PACK GENERAL/GYN (CUSTOM PROCEDURE TRAY) ×2 IMPLANT
PAD ARMBOARD 7.5X6 YLW CONV (MISCELLANEOUS) ×4 IMPLANT
PREVENA RESTOR ARTHOFORM 46X30 (CANNISTER) IMPLANT
SOL PREP POV-IOD 4OZ 10% (MISCELLANEOUS) ×1 IMPLANT
SOL SCRUB PVP POV-IOD 4OZ 7.5% (MISCELLANEOUS) ×2
SOLUTION SCRB POV-IOD 4OZ 7.5% (MISCELLANEOUS) IMPLANT
STAPLER VISISTAT 35W (STAPLE) ×2 IMPLANT
STOCKINETTE IMPERVIOUS LG (DRAPES) ×2 IMPLANT
SUT ETHILON 3 0 PS 1 (SUTURE) IMPLANT
SUT SILK 0 TIES 10X30 (SUTURE) ×2 IMPLANT
SUT SILK 2 0 (SUTURE) ×1
SUT SILK 2 0 SH CR/8 (SUTURE) ×2 IMPLANT
SUT SILK 2-0 18XBRD TIE 12 (SUTURE) ×1 IMPLANT
SUT VIC AB 2-0 CT1 18 (SUTURE) ×4 IMPLANT
SUT VIC AB 3-0 SH 18 (SUTURE) IMPLANT
TOWEL GREEN STERILE (TOWEL DISPOSABLE) ×4 IMPLANT
UNDERPAD 30X36 HEAVY ABSORB (UNDERPADS AND DIAPERS) ×2 IMPLANT
WATER STERILE IRR 1000ML POUR (IV SOLUTION) ×2 IMPLANT

## 2021-02-11 NOTE — Progress Notes (Signed)
Patient arrived to 4E from PACU. Vitals signs taken and stable. Tele placed and CCMD notified. Left AKA assessed and ace wrap in place. Patient drowsy. Call bell within reach.  Ryan [name]

## 2021-02-11 NOTE — Anesthesia Preprocedure Evaluation (Signed)
Anesthesia Evaluation  Patient identified by MRN, date of birth, ID band Patient awake    Reviewed: Allergy & Precautions, NPO status , Patient's Chart, lab work & pertinent test results, reviewed documented beta blocker date and time   History of Anesthesia Complications Negative for: history of anesthetic complications  Airway Mallampati: II  TM Distance: >3 FB Neck ROM: Full    Dental  (+) Edentulous Upper, Edentulous Lower   Pulmonary Current Smoker and Patient abstained from smoking.,    Pulmonary exam normal        Cardiovascular hypertension, Pt. on home beta blockers and Pt. on medications + Peripheral Vascular Disease  Normal cardiovascular exam     Neuro/Psych negative neurological ROS  negative psych ROS   GI/Hepatic negative GI ROS, Neg liver ROS,   Endo/Other   K 2.9   Renal/GU negative Renal ROS     Musculoskeletal negative musculoskeletal ROS (+)   Abdominal   Peds  Hematology  On eliquis    Anesthesia Other Findings Covid+ 12/28/20, asymptomatic  Reproductive/Obstetrics                             Anesthesia Physical  Anesthesia Plan  ASA: 3  Anesthesia Plan: General   Post-op Pain Management:    Induction: Intravenous  PONV Risk Score and Plan: 2 and Treatment may vary due to age or medical condition, Ondansetron and Midazolam  Airway Management Planned: LMA  Additional Equipment:   Intra-op Plan:   Post-operative Plan: Extubation in OR  Informed Consent: I have reviewed the patients History and Physical, chart, labs and discussed the procedure including the risks, benefits and alternatives for the proposed anesthesia with the patient or authorized representative who has indicated his/her understanding and acceptance.     Dental advisory given  Plan Discussed with: CRNA and Anesthesiologist  Anesthesia Plan Comments:         Anesthesia  Quick Evaluation

## 2021-02-11 NOTE — Progress Notes (Signed)
°  Transition of Care [facility]) Screening Note   Patient Details  Name: Ryan [name] Date of Birth: Nov 17, 1950   Transition of Care [facility]) CM/SW Contact:    Darrold Span, RN Phone Number: 02/11/2021, 4:13 PM    Transition of Care Department [facility]) has reviewed patient, note pt s/p AKA today. PT/OT evals pending post op. TOC notified by Iantha Fallen that pt was active with them PTA for [facility] services - they will follow. We will continue to monitor patient advancement through interdisciplinary progression rounds. If new patient transition needs arise, please place a TOC consult.

## 2021-02-11 NOTE — Transfer of Care (Signed)
Immediate Anesthesia Transfer of Care Note  Patient: Ryan [name]  Procedure(s) Performed: LEFT ABOVE KNEE AMPUTATION (Left: Knee)  Patient Location: PACU  Anesthesia Type:General  Level of Consciousness: drowsy and patient cooperative  Airway & Oxygen Therapy: Patient Spontanous Breathing and Patient connected to nasal cannula oxygen  Post-op Assessment: Report given to RN and Post -op Vital signs reviewed and stable  Post vital signs: Reviewed and stable  Last Vitals:  Vitals Value Taken Time  BP 98/49 02/11/21 0907  Temp    Pulse 49 02/11/21 0908  Resp 15 02/11/21 0908  SpO2 100 % 02/11/21 0908  Vitals shown include unvalidated device data.  Last Pain:  Vitals:   02/11/21 0641  TempSrc:   PainSc: 5          Complications: No notable events documented.

## 2021-02-11 NOTE — Anesthesia Procedure Notes (Signed)
Procedure Name: LMA Insertion Date/Time: 02/11/2021 7:50 AM Performed by: Shireen Quan, CRNA Pre-anesthesia Checklist: Patient identified, Emergency Drugs available, Suction available and Patient being monitored Patient Re-evaluated:Patient Re-evaluated prior to induction Oxygen Delivery Method: Circle System Utilized Preoxygenation: Pre-oxygenation with 100% oxygen Induction Type: IV induction Ventilation: Mask ventilation without difficulty LMA: LMA inserted LMA Size: 5.0 Number of attempts: 1 Placement Confirmation: positive ETCO2 Tube secured with: Tape Dental Injury: Teeth and Oropharynx as per pre-operative assessment

## 2021-02-11 NOTE — Op Note (Signed)
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  All the soft tissue and muscle appeared viable.  The fascia was reapproximated with 2-0 Vicryl's and skin was closed with staples.  Dry sterile dressing applied.  SPECIMEN(S):  left above-the-knee amputation  INDICATIONS:   Ryan [name] is a 71 y.o. male who has undergone multiple previous revascularization procedures of the left lower extremity and has no further options for revascularization now with severe rest pain in the foot.  He presents today for left above-knee amputation after risk benefits discussed.  I discussed in depth with the patient the risks, benefits, and alternatives to this procedure.  The patient is aware that the risk of this operation included but are not limited to:  bleeding, infection, myocardial infarction, stroke, death, failure to heal amputation wound, and possible need for more proximal amputation.  The patient is aware of the risks and agrees proceed forward with the procedure.  DESCRIPTION: After full informed written consent was obtained from the patient, the patient was brought back to the operating room, and placed supine upon the operating table.  Prior to induction, the patient received IV antibiotics.  The patient was then prepped and draped in the standard fashion for an above-the-knee amputation.  I marked out the anterior and posterior flaps for a fish-mouth type of amputation.  I made the incisions for these flaps, and then dissected through the subcutaneous tissue, fascia, and muscles circumferentially.   I elevated  the periosteal tissue 4 cm more proximal than the anterior skin flap.  I then transected the femur with a power saw at this level.  I visualized the femoral vessels and these were clamped between Kelly clamps and divided.  Proximal vessel were then ligated with 2-0 silk and 2-0 Vicryl.  I completed the posterior flap with Bovie cautery.  At this point, the specimen was passed off the field as the above-the-knee amputation.  At this point, I clamped all visibly bleeding arteries and veins using a combination of suture ligation with  suture and electrocautery.  The bleeding continued to be controlled with electrocautery and suture ligature.  The stump was washed off with sterile normal saline and no further active bleeding was noted.  I reapproximated the anterior and posterior fascia  with interrupted stitches of 2-0 Vicryl.  This was completed along the entire length of anterior and posterior fascia until there were no more loose space in the fascial line.  The skin was then  reapproximated with staples.  The stump was washed off and dried.  The incision was dressed with Xeroform and  then fluffs were applied.  Kerlix was wrapped around the leg and then gently an ACE wrap was applied.    COMPLICATIONS: None  CONDITION: Stable  Cephus Shelling, MD [facility] Office: [phone]  Cephus Shelling   02/11/2021, 8:58 AM

## 2021-02-11 NOTE — H&P (Signed)
History and Physical Interval Note:  02/11/2021 7:38 AM  Gahel Kirsh  has presented today for surgery, with the diagnosis of Critical limb ischemia.  The various methods of treatment have been discussed with the patient and family. After consideration of risks, benefits and other options for treatment, the patient has consented to  Procedure(s): LEFT ABOVE KNEE AMPUTATION (Left) as a surgical intervention.  The patient's history has been reviewed, patient examined, no change in status, stable for surgery.  I have reviewed the patient's chart and labs.  Questions were answered to the patient's satisfaction.    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  We have previously recommended primary amputation after many failed procedures and he has refused.  He got a second opinion at [facility] who also agrees with primary amputation.  Patient would like left above-knee amputation and I agree this would give him the best chance of healing.  Plan left above knee amputation.      Cephus Shelling  POST OPERATIVE OFFICE NOTE       CC:  F/u for surgery   HPI:  [name] who is s/p  1.  Redo exposure of left common femoral artery greater than 30 days 2.  Harvest of left leg great saphenous vein 3.  Thrombectomy of left common femoral artery to below-knee popliteal artery prosthetic bypass (including removal of proximal stent in bypass) 4.  Jump graft from the below-knee popliteal prosthetic bypass to the peroneal artery using saphenous vein interposition for composite bypass graft  on 12/31/2020 by Dr. Chestine Spore.     Indications for surgery:  Patient is a 71 year old male who has undergone multiple vascular interventions including a femoropopliteal bypass with prosthetic by Dr. Imogene Burn in the past and then a redo bypass by myself.  This has undergone multiple interventions to keep patency with  thrombolysis as well as additional revisions in the OR with thrombectomy and patch angioplasty.  He has very limited options moving forward and only has peroneal runoff with an occluded bypass.  He presents today after we recommended primary amputation and patient wanted to proceed with all further attempts for limb salvage after risk and benefits discussed.   He was seen a week ago as he was having pain on the top of the foot where the sore was and that his below knee incision was tender and tight but had not been draining.  He did not have any fevers and overall, the pain was better than before surgery.  He was given pain rx for night time pain to help him sleep and abx for some mild erythema around the below knee incision.     Pt returns today for follow up.  Pt states his left foot still has pain and is about the same.  He states the swelling around the incision is worse.  He has not had any fevers.  There has not really been any drainage from the incision.  He says his groin incision has healed.    No Known Allergies         Current Outpatient Medications  Medication Sig Dispense Refill   aspirin EC 81 MG EC tablet Take 1 tablet (81 mg total) by mouth daily at 6 (six) AM. Swallow whole.       atenolol (TENORMIN) 50 MG tablet Take 50 mg by mouth in the morning.       cephALEXin (  KEFLEX) 500 MG capsule Take 1 capsule (500 mg total) by mouth 2 (two) times daily. 14 capsule 0   HYDROcodone-acetaminophen (NORCO) 5-325 MG tablet Take 1 tablet by mouth every 6 (six) hours as needed for moderate pain. 20 tablet 0   losartan-hydrochlorothiazide (HYZAAR) 100-25 MG tablet Take 1 tablet by mouth in the morning.       rosuvastatin (CRESTOR) 20 MG tablet Take 20 mg by mouth at bedtime.        No current facility-administered medications for this visit.       ROS:  See HPI   Physical Exam:      Today's Vitals    01/25/21 1037  BP: (!) 87/57  Pulse: (!) 112  Temp: 98 F (36.7 C)  TempSrc: Skin   SpO2: 99%  PainSc: 8     There is no height or weight on file to calculate BMI.     Incision:  left groin incision is clean and healed nicely.  The left below knee incision still with some mild erythema and probable small hematoma distal incision.     Extremities:  left dampened monophasic peroneal doppler signal.           Assessment/Plan:  [name] who is s/p: 1.  Redo exposure of left common femoral artery greater than 30 days 2.  Harvest of left leg great saphenous vein 3.  Thrombectomy of left common femoral artery to below-knee popliteal artery prosthetic bypass (including removal of proximal stent in bypass) 4.  Jump graft from the below-knee popliteal prosthetic bypass to the peroneal artery using saphenous vein interposition for composite bypass graft   -pt with dampened monophasic left peroneal doppler signal.  This is not as good as it sounded last week.  Pt seen with Dr. Chestine Spore and bypass is most likely occluded and does not have enough blood flow to heal his ulcer on his foot.  His pain is not improved.  He is not willing to proceed with amputation at this time. -Dr. Chestine Spore discussed with pt that he is concerned this wound is going to get worse and his only option would be for a below knee amputation.  Pt currently not willing to do this.  Dr. Chestine Spore has asked the pt to return in 2 weeks to his clinic for further discussions.  We will give him one more rx for pain medication but discussed with pt that we cannot continue to give pain medication.  -also has some erythema around the incision - will send another rx for Keflex. -pt will call sooner if he changes his mind and wants to proceed with amputation.   Prescriptions sent:  Norco 5/325 one po q6h prn pain #20 no refill  Keflex 500mg  po bid #20 no refill     , [facility] [phone]     Clinic MD:  [phone]

## 2021-02-12 LAB — CBC
HCT: 24.9 % — ABNORMAL LOW (ref 39.0–52.0)
Hemoglobin: 7.9 g/dL — ABNORMAL LOW (ref 13.0–17.0)
MCH: 25.2 pg — ABNORMAL LOW (ref 26.0–34.0)
MCHC: 31.7 g/dL (ref 30.0–36.0)
MCV: 79.3 fL — ABNORMAL LOW (ref 80.0–100.0)
Platelets: 496 10*3/uL — ABNORMAL HIGH (ref 150–400)
RBC: 3.14 MIL/uL — ABNORMAL LOW (ref 4.22–5.81)
RDW: 16.3 % — ABNORMAL HIGH (ref 11.5–15.5)
WBC: 14.6 10*3/uL — ABNORMAL HIGH (ref 4.0–10.5)
nRBC: 0 % (ref 0.0–0.2)

## 2021-02-12 LAB — BASIC METABOLIC PANEL
Anion gap: 8 (ref 5–15)
BUN: 39 mg/dL — ABNORMAL HIGH (ref 8–23)
CO2: 23 mmol/L (ref 22–32)
Calcium: 8.3 mg/dL — ABNORMAL LOW (ref 8.9–10.3)
Chloride: 106 mmol/L (ref 98–111)
Creatinine, Ser: 1.1 mg/dL (ref 0.61–1.24)
GFR, Estimated: >60 mL/min (ref >60)
Glucose, Bld: 279 mg/dL — ABNORMAL HIGH (ref 70–99)
Potassium: 2.6 mmol/L — CL (ref 3.5–5.1)
Sodium: 137 mmol/L (ref 135–145)

## 2021-02-12 LAB — MAGNESIUM: Magnesium: 1.6 mg/dL — ABNORMAL LOW (ref 1.7–2.4)

## 2021-02-12 MED ORDER — POTASSIUM CHLORIDE 10 MEQ/100ML IV SOLN
10.0000 meq | INTRAVENOUS | Status: AC
  Administered 2021-02-12 (×6): 10 meq via INTRAVENOUS
  Filled 2021-02-12 (×6): qty 100

## 2021-02-12 MED ORDER — MELATONIN 3 MG PO TABS
3.0000 mg | ORAL_TABLET | Freq: Every day | ORAL | Status: DC
  Administered 2021-02-12 – 2021-02-15 (×4): 3 mg via ORAL
  Filled 2021-02-12 (×4): qty 1

## 2021-02-12 MED ORDER — MORPHINE SULFATE (PF) 4 MG/ML IV SOLN
4.0000 mg | INTRAVENOUS | Status: DC | PRN
  Administered 2021-02-12 – 2021-02-15 (×4): 4 mg via INTRAVENOUS
  Filled 2021-02-12 (×4): qty 1

## 2021-02-12 MED ORDER — POTASSIUM CHLORIDE CRYS ER 20 MEQ PO TBCR
40.0000 meq | EXTENDED_RELEASE_TABLET | Freq: Once | ORAL | Status: AC
  Administered 2021-02-12: 40 meq via ORAL
  Filled 2021-02-12: qty 2

## 2021-02-12 NOTE — Progress Notes (Signed)
Inpatient Rehab Admissions:  Inpatient Rehab Consult received.  I met with patient at the bedside for rehabilitation assessment and to discuss goals and expectations of an inpatient rehab admission.  Pt acknowledged understanding of CIR goals and expectations. Pt interested in pursuing CIR. Pt gave permission to contact wife, Enid Derry. Left a message; awaiting a return call. Will continue to follow.  Signed: Gayland Curry, [place], [place] Admissions Coordinator [phone]

## 2021-02-12 NOTE — Evaluation (Signed)
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 PMH includes: HTN, PVD, and tobacco use.   Clinical Impression   This 71 yo male admitted and underwent above presents to acute OT with PLOF of being totally independent with basic ADLs/mobility. Currently he is setup-Mod A +2. He will continue to benefit from acute OT with follow up on CIR to get to a Mod I level (W/C, maybe some from a RW) to return home.     Recommendations for follow up therapy are one component of a multi-disciplinary discharge planning process, led by the attending physician.  Recommendations may be updated based on patient status, additional functional criteria and insurance authorization.   Follow Up Recommendations  Acute inpatient rehab (3hours/day)    Assistance Recommended at Discharge Frequent or constant Supervision/Assistance  Patient can return home with the following A lot of help with bathing/dressing/bathroom;A lot of help with walking and/or transfers;Assist for transportation;Assistance with cooking/housework    Functional Status Assessment  Patient has had a recent decline in their functional status and demonstrates the ability to make significant improvements in function in a reasonable and predictable amount of time.  Equipment Recommendations  Other (comment) (TBD next venue)       Precautions / Restrictions Precautions Precautions: Fall Precaution Comments: L AKA Restrictions Weight Bearing Restrictions: Yes LLE Weight Bearing: Non weight bearing      Mobility Bed Mobility Overal bed mobility: Needs Assistance Bed Mobility: Rolling;Sidelying to Sit Rolling: Min assist Sidelying to sit: Min assist       General bed mobility  comments: minA to complete, pt with good use of bed rails. cues for movement/technique. slow movements due to tenderness of L AKA    Transfers Overall transfer level: Needs assistance Equipment used: Rolling walker (2 wheels) Transfers: Sit to/from Stand;Bed to chair/wheelchair/BSC Sit to Stand: Mod assist;+2 physical assistance Stand pivot transfers: Mod assist         General transfer comment: modA to power up and steady. assist to RW to maintain in contact with ground. then able to wiggle RLE to make pivotal movement towards recliner. not hopping at this time      Balance Overall balance assessment: Needs assistance Sitting-balance support: No upper extremity supported Sitting balance-Leahy Scale: Good     Standing balance support: Bilateral upper extremity supported;During functional activity Standing balance-Leahy Scale: Poor Standing balance comment: BUE support and min-modA of 2 to maintain upright                           ADL either performed or assessed with clinical judgement   ADL Overall ADL's : Needs assistance/impaired Eating/Feeding: Independent;Sitting   Grooming: Set up;Sitting   Upper Body Bathing: Set up;Sitting   Lower Body Bathing: Moderate assistance;Bed level   Upper Body Dressing : Set up;Sitting   Lower Body Dressing: Maximal assistance;Bed level   Toilet Transfer: Stand-pivot;Rolling walker (2 wheels);Moderate assistance Toilet Transfer Details (indicate cue type and reason): bed>recliner pivoting on RLE and using RW Toileting- Clothing Manipulation and Hygiene: Total assistance Toileting - Clothing Manipulation Details (indicate cue type and reason): Can stand with +2 mod A and maintain with +1 min A (but cannot release hands and maintain balance for clothing while standing)  Vision Patient Visual Report: No change from baseline              Pertinent Vitals/Pain Pain Assessment: Faces Faces Pain Scale:  Hurts whole lot Pain Location: L AKA Pain Descriptors / Indicators: Discomfort;Grimacing Pain Intervention(s): Limited activity within patient's tolerance;Monitored during session;Repositioned     Hand Dominance Right   Extremity/Trunk Assessment Upper Extremity Assessment Upper Extremity Assessment: Generalized weakness   Lower Extremity Assessment Lower Extremity Assessment: LLE deficits/detail LLE Deficits / Details: new L AKA, unable to relax for hip extension or neutral positioning. able to abd/add at hip LLE: Unable to fully assess due to pain       Communication Communication Communication: No difficulties   Cognition Arousal/Alertness: Awake/alert Behavior During Therapy: WFL for tasks assessed/performed Overall Cognitive Status: Within Functional Limits for tasks assessed                                 General Comments: pt pleasant and able to answer questions appropriately. following commands well     General Comments  VSS on RA, BP soft but stable and pt asymptomatic    Exercises Exercises: Amputee Amputee Exercises Hip ABduction/ADduction: AROM;Left;5 reps;Seated        Home Living Family/patient expects to be discharged to:: Private residence Living Arrangements: Spouse/significant other Available Help at Discharge: Family;Available 24 hours/day Type of Home: House Home Access: Level entry     Home Layout: One level     Bathroom Shower/Tub: Tub/shower unit;Door   Bathroom Toilet: Handicapped height     Home Equipment: Agricultural consultant (2 wheels);Cane - single point;BSC/3in1          Prior Functioning/Environment Prior Level of Function : Independent/Modified Independent             Mobility Comments: reports uses RW mostly but cane at times ADLs Comments: reports return to full independence after last surgery, does use bird bath for last 9 years        OT Problem List: Decreased strength;Impaired balance (sitting  and/or standing);Pain      OT Treatment/Interventions: Self-care/ADL training;DME and/or AE instruction;Patient/family education;Balance training    OT Goals(Current goals can be found in the care plan section) Acute Rehab OT Goals Patient Stated Goal: to go to rehab then home OT Goal Formulation: With patient Time For Goal Achievement: 02/26/21 Potential to Achieve Goals: Good  OT Frequency: Min 2X/week    Co-evaluation PT/OT/SLP Co-Evaluation/Treatment: Yes Reason for Co-Treatment: For patient/therapist safety;To address functional/ADL transfers PT goals addressed during session: Mobility/safety with mobility;Balance;Proper use of DME OT goals addressed during session: ADL's and self-care;Strengthening/ROM      AM-PAC OT "6 Clicks" Daily Activity     Outcome Measure Help from another person eating meals?: None Help from another person taking care of personal grooming?: A Little Help from another person toileting, which includes using toliet, bedpan, or urinal?: A Lot Help from another person bathing (including washing, rinsing, drying)?: A Lot Help from another person to put on and taking off regular upper body clothing?: A Little Help from another person to put on and taking off regular lower body clothing?: A Lot 6 Click Score: 16   End of Session Equipment Utilized During Treatment: Gait belt;Rolling walker (2 wheels) Nurse Communication: Mobility status (write on board in pt's room)  Activity Tolerance: Patient tolerated treatment well Patient left: in chair;with call bell/phone within reach;with chair alarm set  OT Visit Diagnosis: Unsteadiness  on feet (R26.81);Other abnormalities of gait and mobility (R26.89);Muscle weakness (generalized) (M62.81);Pain Pain - Right/Left: Left Pain - part of body: Leg (left residual limb)                Time: 5784-69621137-1204 OT Time Calculation (min): 27 min Charges:  OT General Charges $OT Visit: 1 Visit OT Evaluation $OT Eval Moderate  Complexity: 1 Mod  Ignacia Palmaathy Rondi Ivy, OTR/L [facility] Pager [phone] Office [phone]    Evette GeorgesLeonard, Eldonna Neuenfeldt Eva 02/12/2021, 1:35 PM

## 2021-02-12 NOTE — Progress Notes (Signed)
[facility] [place] PROGRESS NOTE  ASSESSMENT / PLAN: Ryan [name] is a 71 y.o. 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  Doing well.  Continue as needed pain control.  Keep dressing in place until postoperative day 2.  Mobilize as able with PT/OT.  Likely need inpatient rehab versus SNF.  Monitor hemoglobin.  New hyperglycemia, no established diagnosis of DM.  Check A1c with tomorrow's labs.  Replete potassium.  SUBJECTIVE: Pain well controlled.  Not yet mobilized.  OBJECTIVE: BP (!) 94/59 (BP Location: Right Arm)    Pulse 63    Temp 98 F (36.7 C) (Oral)    Resp 18    Ht 5\' 6"  (1.676 m)    Wt 63.5 kg    SpO2 96%    BMI 22.60 kg/m   Intake/Output Summary (Last 24 hours) at 02/12/2021 1019 Last data filed at 02/12/2021 [id number] Gross per 24 hour  Intake 1270.91 ml  Output 1200 ml  Net 70.91 ml    No acute distress Regular rate and rhythm Unlabored breathing Soft abdomen Left amputation site bandage clean, dry, intact.  CBC Latest Ref Rng & Units 02/12/2021 02/11/2021 01/04/2021  WBC 4.0 - 10.5 K/uL 14.6(H) 22.8(H) 7.7  Hemoglobin 13.0 - 17.0 g/dL 7.9(L) 8.7(L) 9.2(L)  Hematocrit 39.0 - 52.0 % 24.9(L) 27.5(L) 27.5(L)  Platelets 150 - 400 K/uL 496(H) 620(H) 264     CMP Latest Ref Rng & Units 02/12/2021 02/11/2021 01/04/2021  Glucose 70 - 99 mg/dL 279(H) 172(H) 109(H)  BUN 8 - 23 mg/dL 39(H) 55(H) 9  Creatinine 0.61 - 1.24 mg/dL 1.10 1.70(H) 0.95  Sodium 135 - 145 mmol/L 137 137 135  Potassium 3.5 - 5.1 mmol/L 2.6(LL) 2.1(LL) 3.3(L)  Chloride 98 - 111 mmol/L 106 101 111  CO2 22 - 32 mmol/L 23 22 19(L)  Calcium 8.9 - 10.3 mg/dL 8.3(L) 8.9 7.7(L)  Total Protein 6.5 - 8.1 g/dL - 7.4 -  Total Bilirubin 0.3 - 1.2 mg/dL - 0.7 -  Alkaline Phos 38 - 126 U/L - 79 -  AST 15 - 41 U/L - 29 -  ALT 0 - 44 U/L - 19 -    Estimated Creatinine Clearance: 54.5 mL/min (by C-G formula based on SCr of 1.1 mg/dL).  [name]. [name], MD [facility]ascular [facility] Vein  [facility] of [facility] Phone Number: [phone] 02/12/2021 10:19 AM

## 2021-02-12 NOTE — Plan of Care (Signed)
°  Problem: Clinical Measurements: °Goal: Will remain free from infection °Outcome: Progressing °  °Problem: Activity: °Goal: Risk for activity intolerance will decrease °Outcome: Progressing °  °Problem: Nutrition: °Goal: Adequate nutrition will be maintained °Outcome: Progressing °  °Problem: Elimination: °Goal: Will not experience complications related to bowel motility °Outcome: Progressing °  °

## 2021-02-12 NOTE — Evaluation (Signed)
Physical Therapy Evaluation Patient Details Name: Ryan MedicusLevon Brisbane MRN: [id number] DOB: 10/31/1950 Today's Date: 02/12/2021  History of Present Illness  The pt is a 71 yo male presenting 1/13 for L AKA due to critical limb ischemia after multiple failed revascularization attempts (most reccently 12/31/2020). PMH includes: HTN, PVD, and tobacco use.   Clinical Impression  Pt in bed upon arrival of PT, agreeable to evaluation at this time. Prior to admission the pt was mobilizing with use of RW at home, and reports he had returned to full independence after most recent hospitalization. The pt now presents with limitations in functional mobility, strength, power, dynamic stability, and activity tolerance due to above dx, and will continue to benefit from skilled PT to address these deficits. The pt was agreeable to all mobility, but required minA to complete all bed mobility and modA of 1-2 to complete sit-stand transfers and pivot to recliner at this time due to impaired stability following L AKA. He was unable to progress to hopping steps at this time, will benefit from skilled PT to maximize return to full independence with transfers and mobility in the home. Recommend acute inpatient rehab at d/c for pt/family training as well as to achieve pt goals of return to independence.        Recommendations for follow up therapy are one component of a multi-disciplinary discharge planning process, led by the attending physician.  Recommendations may be updated based on patient status, additional functional criteria and insurance authorization.  Follow Up Recommendations Acute inpatient rehab (3hours/day)    Assistance Recommended at Discharge Frequent or constant Supervision/Assistance  Patient can return home with the following  A lot of help with walking and/or transfers;A little help with bathing/dressing/bathroom;Assistance with cooking/housework;Assist for transportation;Help with stairs or ramp for  entrance    Equipment Recommendations  (defer to post acute)  Recommendations for Other Services  Rehab consult    Functional Status Assessment Patient has had a recent decline in their functional status and demonstrates the ability to make significant improvements in function in a reasonable and predictable amount of time.     Precautions / Restrictions Precautions Precautions: Fall Precaution Comments: L AKA Restrictions Weight Bearing Restrictions: Yes LLE Weight Bearing: Non weight bearing      Mobility  Bed Mobility Overal bed mobility: Needs Assistance Bed Mobility: Rolling;Sidelying to Sit Rolling: Min assist Sidelying to sit: Min assist       General bed mobility comments: minA to complete, pt with good use of bed rails. cues for movement/technique. slow movements due to tenderness of L AKA    Transfers Overall transfer level: Needs assistance Equipment used: Rolling walker (2 wheels) Transfers: Sit to/from Stand;Bed to chair/wheelchair/BSC Sit to Stand: Mod assist;+2 physical assistance Stand pivot transfers: Mod assist         General transfer comment: modA to power up and steady. assist to RW to maintain in contact with ground. then able to wiggle RLE to make pivotal movement towards recliner. not hopping at this time    Ambulation/Gait               General Gait Details: unable      Balance Overall balance assessment: Needs assistance Sitting-balance support: No upper extremity supported Sitting balance-Leahy Scale: Good     Standing balance support: Bilateral upper extremity supported;During functional activity Standing balance-Leahy Scale: Poor Standing balance comment: BUE support and min-modA of 2 to maintain upright  Pertinent Vitals/Pain Pain Assessment: Faces Faces Pain Scale: Hurts whole lot Pain Location: L AKA Pain Descriptors / Indicators: Discomfort;Grimacing Pain Intervention(s):  Limited activity within patient's tolerance;Monitored during session;Repositioned    Home Living Family/patient expects to be discharged to:: Private residence Living Arrangements: Spouse/significant other Available Help at Discharge: Family;Available 24 hours/day Type of Home: House Home Access: Level entry       Home Layout: One level Home Equipment: Agricultural consultant (2 wheels);Cane - single point;BSC/3in1      Prior Function Prior Level of Function : Independent/Modified Independent             Mobility Comments: reports uses RW mostly but cane at times ADLs Comments: reports return to full independence after last surgery, does use bird bath for last 9 years     Hand Dominance   Dominant Hand: Right    Extremity/Trunk Assessment   Upper Extremity Assessment Upper Extremity Assessment: Defer to OT evaluation;Overall [facility] for tasks assessed    Lower Extremity Assessment Lower Extremity Assessment: LLE deficits/detail LLE Deficits / Details: new L AKA, unable to relax for hip extension or neutral positioning. able to abd/add at hip LLE: Unable to fully assess due to pain       Communication   Communication: No difficulties  Cognition Arousal/Alertness: Awake/alert Behavior During Therapy: WFL for tasks assessed/performed Overall Cognitive Status: Within Functional Limits for tasks assessed                                 General Comments: pt pleasant and able to answer questions appropriately. following commands well        General Comments General comments (skin integrity, edema, etc.): VSS on RA, BP soft but stable and pt asymptomatic    Exercises Amputee Exercises Hip ABduction/ADduction: AROM;Left;5 reps;Seated   Assessment/Plan    PT Assessment Patient needs continued PT services  PT Problem List Decreased strength;Decreased range of motion;Decreased activity tolerance;Decreased balance;Decreased mobility;Pain       PT Treatment  Interventions DME instruction;Gait training;Stair training;Functional mobility training;Therapeutic activities;Therapeutic exercise;Balance training;Patient/family education    PT Goals (Current goals can be found in the Care Plan section)  Acute Rehab PT Goals Patient Stated Goal: return home, return to independence PT Goal Formulation: With patient Time For Goal Achievement: 02/26/21 Potential to Achieve Goals: Good    Frequency Min 3X/week     Co-evaluation PT/OT/SLP Co-Evaluation/Treatment: Yes Reason for Co-Treatment: For patient/therapist safety;To address functional/ADL transfers PT goals addressed during session: Mobility/safety with mobility;Balance;Proper use of DME         AM-PAC PT "6 Clicks" Mobility  Outcome Measure Help needed turning from your back to your side while in a flat bed without using bedrails?: A Little Help needed moving from lying on your back to sitting on the side of a flat bed without using bedrails?: A Little Help needed moving to and from a bed to a chair (including a wheelchair)?: A Lot Help needed standing up from a chair using your arms (e.g., wheelchair or bedside chair)?: A Lot Help needed to walk in hospital room?: Total Help needed climbing 3-5 steps with a railing? : Total 6 Click Score: 12    End of Session Equipment Utilized During Treatment: Gait belt Activity Tolerance: Patient tolerated treatment well Patient left: in chair;with call bell/phone within reach;with chair alarm set Nurse Communication: Mobility status PT Visit Diagnosis: Other abnormalities of gait and mobility (R26.89);Unsteadiness on feet (R26.81);Muscle  weakness (generalized) (M62.81);Pain Pain - Right/Left: Left Pain - part of body: Leg    Time: 1137-1202 PT Time Calculation (min) (ACUTE ONLY): 25 min   Charges:   PT Evaluation $PT Eval Low Complexity: 1 Low          Vickki Muff, PT, DPT   Acute Rehabilitation Department Pager #: [phone]  Ronnie Derby 02/12/2021, 1:10 PM

## 2021-02-12 NOTE — Progress Notes (Signed)
Date and time results received: 02/12/21 0314 (use smartphrase ".now" to insert current time)  Test: Potassium Critical Value: 2.6  Name of Provider Notified: Bethann Humble  Orders Received? Or Actions Taken?: Orders Received - See Orders for details

## 2021-02-13 ENCOUNTER — Encounter (HOSPITAL_COMMUNITY): Payer: Self-pay | Admitting: Vascular Surgery

## 2021-02-13 LAB — BASIC METABOLIC PANEL
Anion gap: 5 (ref 5–15)
BUN: 23 mg/dL (ref 8–23)
CO2: 23 mmol/L (ref 22–32)
Calcium: 7.9 mg/dL — ABNORMAL LOW (ref 8.9–10.3)
Chloride: 109 mmol/L (ref 98–111)
Creatinine, Ser: 0.96 mg/dL (ref 0.61–1.24)
GFR, Estimated: >60 mL/min (ref >60)
Glucose, Bld: 138 mg/dL — ABNORMAL HIGH (ref 70–99)
Potassium: 3.1 mmol/L — ABNORMAL LOW (ref 3.5–5.1)
Sodium: 137 mmol/L (ref 135–145)

## 2021-02-13 LAB — CBC
HCT: 22.6 % — ABNORMAL LOW (ref 39.0–52.0)
Hemoglobin: 7 g/dL — ABNORMAL LOW (ref 13.0–17.0)
MCH: 25.5 pg — ABNORMAL LOW (ref 26.0–34.0)
MCHC: 31 g/dL (ref 30.0–36.0)
MCV: 82.2 fL (ref 80.0–100.0)
Platelets: 453 10*3/uL — ABNORMAL HIGH (ref 150–400)
RBC: 2.75 MIL/uL — ABNORMAL LOW (ref 4.22–5.81)
RDW: 16.3 % — ABNORMAL HIGH (ref 11.5–15.5)
WBC: 13.1 10*3/uL — ABNORMAL HIGH (ref 4.0–10.5)
nRBC: 0 % (ref 0.0–0.2)

## 2021-02-13 LAB — PREPARE RBC (CROSSMATCH)

## 2021-02-13 LAB — HEMOGLOBIN A1C
Hgb A1c MFr Bld: 6.9 % — ABNORMAL HIGH (ref 4.8–5.6)
Mean Plasma Glucose: 151.33 mg/dL

## 2021-02-13 MED ORDER — POTASSIUM CHLORIDE CRYS ER 20 MEQ PO TBCR
40.0000 meq | EXTENDED_RELEASE_TABLET | Freq: Once | ORAL | Status: AC
  Administered 2021-02-13: 40 meq via ORAL
  Filled 2021-02-13: qty 2

## 2021-02-13 NOTE — Progress Notes (Signed)
Inpatient Rehab Admissions Coordinator:  Pt's wife and daughter returned call. They would like to talk about rehab options with the attending prior to making a decision regarding which venue to choose for pt.  They are planning on meeting with attending on 1/16.  Will continue to follow.  Wolfgang Phoenix, MS, CCC-SLP Admissions Coordinator [phone]

## 2021-02-13 NOTE — Progress Notes (Signed)
[facility] [place] PROGRESS NOTE  ASSESSMENT / PLAN: Ryan [name] is a 71 y.o. 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  Doing well.  Dressing taking down today.  Incision healing appropriately.  Mobilize as able with PT/OT.  Likely need inpatient rehab versus SNF.  Hemoglobin 7.0 today.  Will transfuse.  A1c = 6.9. Patient has new diagnosis of DM2. Will ask DM educator to evaluate.  Replete potassium.  SUBJECTIVE: Pain well controlled.  Not yet mobilized.  OBJECTIVE: BP 123/60 (BP Location: Right Arm)    Pulse (!) 57    Temp 98.3 F (36.8 C) (Oral)    Resp 15    Ht 5\' 6"  (1.676 m)    Wt 63.5 kg    SpO2 94%    BMI 22.60 kg/m   Intake/Output Summary (Last 24 hours) at 02/13/2021 1014 Last data filed at 02/13/2021 0744 Gross per 24 hour  Intake 100 ml  Output 2200 ml  Net -2100 ml     No acute distress Regular rate and rhythm Unlabored breathing Soft abdomen Left amputation incision healing well  CBC Latest Ref Rng & Units 02/13/2021 02/12/2021 02/11/2021  WBC 4.0 - 10.5 K/uL 13.1(H) 14.6(H) 22.8(H)  Hemoglobin 13.0 - 17.0 g/dL 7.0(L) 7.9(L) 8.7(L)  Hematocrit 39.0 - 52.0 % 22.6(L) 24.9(L) 27.5(L)  Platelets 150 - 400 K/uL 453(H) 496(H) 620(H)     CMP Latest Ref Rng & Units 02/13/2021 02/12/2021 02/11/2021  Glucose 70 - 99 mg/dL 138(H) 279(H) 172(H)  BUN 8 - 23 mg/dL 23 39(H) 55(H)  Creatinine 0.61 - 1.24 mg/dL 0.96 1.10 1.70(H)  Sodium 135 - 145 mmol/L 137 137 137  Potassium 3.5 - 5.1 mmol/L 3.1(L) 2.6(LL) 2.1(LL)  Chloride 98 - 111 mmol/L 109 106 101  CO2 22 - 32 mmol/L 23 23 22   Calcium 8.9 - 10.3 mg/dL 7.9(L) 8.3(L) 8.9  Total Protein 6.5 - 8.1 g/dL - - 7.4  Total Bilirubin 0.3 - 1.2 mg/dL - - 0.7  Alkaline Phos 38 - 126 U/L - - 79  AST 15 - 41 U/L - - 29  ALT 0 - 44 U/L - - 19    Estimated Creatinine Clearance: 62.5 mL/min (by C-G formula based on SCr of 0.96 mg/dL).  Ryan [name]. [name], MD [facility]in Specialists of  [facility] Phone Number: [phone] 02/13/2021 10:14 AM

## 2021-02-13 NOTE — Social Work (Incomplete)
CSW was alerted by CIR- staff Lauren that family is wanting to possibly pursue SNF at [facility]. Lauren explained that family is having a meeting to make a final decision. They want to discuss both options tomorrow with MD. Clovis Cao has been completed if needed for SNF.

## 2021-02-13 NOTE — Progress Notes (Signed)
Inpatient Diabetes Program Recommendations  AACE/ADA: New Consensus Statement on Inpatient Glycemic Control   Target Ranges:  Prepandial:   less than 140 mg/dL      Peak postprandial:   less than 180 mg/dL (1-2 hours)      Critically ill patients:  140 - 180 mg/dL    Latest Reference Range & Units 12/29/07 05:46 02/13/21 02:01  Hemoglobin A1C 4.8 - 5.6 % 6.5  6.9 (H)    Latest Reference Range & Units 02/11/21 06:02 02/12/21 01:33 02/13/21 02:01  Glucose 70 - 99 mg/dL 622 (H) 633 (H) 354 (H)   Review of Glycemic Control  Diabetes history: No Outpatient Diabetes medications: NA Current orders for Inpatient glycemic control: none  Inpatient Diabetes Program Recommendations:    Insulin: While inpatient, please order CBGs with Novolog 0-6 units TID with meals and Novolog 0-5 units QHS.  HbgA1C:  A1C 6.9% on 02/13/21 indicating an average glucose of 151 mg/dl over the past 2-3 months.  NOTE: Noted consult for Diabetes Coordinator. Diabetes Coordinator is not on campus over the weekend but available by pager from 8am to 5pm for questions or concerns. Chart reviewed. No prior DM dx noted in chart. Current A1C 6.9% on 02/13/21. Inpatient diabetes coordinator will follow up with patient on Monday regarding new DM dx.  Thanks, Orlando Penner, RN, MSN, CDE Diabetes Coordinator Inpatient Diabetes Program [phone] (Team Pager from 8am to 5pm)

## 2021-02-13 NOTE — [place] FL2 (Signed)
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, 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/12/2020   Critical lower limb ischemia (HCC) 12/11/2016   Claudication in peripheral vascular disease (HCC) 11/28/2016   HTN (hypertension) 10/09/2014   Tobacco use 10/09/2014   PAD (peripheral artery disease) (HCC)     Orientation RESPIRATION BLADDER Height & Weight     Self, Time, Situation, Place  Normal Continent Weight: 140 lb (63.5 kg) Height:  5\' 6"  (167.6 cm)  BEHAVIORAL SYMPTOMS/MOOD NEUROLOGICAL BOWEL NUTRITION STATUS      Continent Diet (see discharge summary)  AMBULATORY STATUS COMMUNICATION OF NEEDS Skin   Total Care Verbally Surgical wounds                       Personal Care Assistance Level of Assistance  Bathing, Feeding, Dressing Bathing Assistance: Limited assistance Feeding assistance: Independent Dressing Assistance: Maximum assistance     Functional Limitations Info  Hearing, Sight, Speech Sight Info: Adequate Hearing Info: Adequate Speech Info: Adequate    SPECIAL CARE FACTORS FREQUENCY  PT (By licensed PT), OT (By  licensed OT)     PT Frequency: 5x week OT Frequency: 5x week            Contractures Contractures Info: Not present    Additional Factors Info  Code Status, Allergies Code Status Info: full Allergies Info: NKA           Current Medications (02/13/2021):  This is the current hospital active medication list Current Facility-Administered Medications  Medication Dose Route Frequency Provider Last Rate Last Admin   0.9 %  sodium chloride infusion   Intravenous Continuous 02/15/2021, PA-C 50 mL/hr at 02/12/21 1443 New Bag at 02/12/21 1443   alum & mag hydroxide-simeth (MAALOX/MYLANTA) 200-200-20 MG/5ML suspension 15-30 mL  15-30 mL Oral Q2H PRN 05-23-1974, PA-C       aspirin EC tablet 81 mg  81 mg Oral Lars Mage [id number], PA-C   81 mg at 02/13/21 02/15/21   atenolol (TENORMIN) tablet 50 mg  50 mg Oral q AM 2426 M, PA-C       bisacodyl (DULCOLAX) EC tablet 5 mg  5 mg Oral Daily PRN M M, PA-C       diphenhydrAMINE (BENADRYL) 12.5 MG/5ML elixir 12.5-25 mg  12.5-25 mg Oral Q4H PRN 03-01-1984 M, PA-C       docusate sodium (COLACE) capsule 100 mg  100 mg Oral Daily M M, PA-C   100 mg at 02/13/21 0815   guaiFENesin-dextromethorphan (ROBITUSSIN DM) 100-10 MG/5ML syrup 15 mL  15 mL Oral Q4H PRN 02/15/21, PA-C  heparin injection 5,000 Units  5,000 Units Subcutaneous Q8H Lars Mage, [place]   5,000 Units at 02/13/21 3007   hydrALAZINE (APRESOLINE) injection 5 mg  5 mg Intravenous Q20 Min PRN Clinton Gallant M, PA-C       hydrochlorothiazide (HYDRODIURIL) tablet 25 mg  25 mg Oral Daily Cephus Shelling, MD   25 mg at 02/13/21 0816   HYDROcodone-acetaminophen (NORCO/VICODIN) 5-325 MG per tablet 1-2 tablet  1-2 tablet Oral Q4H PRN Lars Mage, PA-C   2 tablet at 02/13/21 6226   labetalol (NORMODYNE) injection 10 mg  10 mg Intravenous Q10 min PRN Clinton Gallant M, PA-C       losartan (COZAAR) tablet 100 mg  100 mg Oral Daily Cephus Shelling, MD   100 mg at 02/13/21 0816   magnesium citrate solution 1 Bottle  1 Bottle Oral Once PRN Clinton Gallant M, PA-C       melatonin tablet 3 mg  3 mg Oral QHS Leonie Douglas, MD   3 mg at 02/12/21 2051   metoprolol tartrate (LOPRESSOR) injection 2-5 mg  2-5 mg Intravenous Q2H PRN Clinton Gallant M, PA-C       morphine 4 MG/ML injection 4 mg  4 mg Intravenous Q4H PRN Leonie Douglas, MD   4 mg at 02/13/21 0615   ondansetron (ZOFRAN) injection 4 mg  4 mg Intravenous Q6H PRN Clinton Gallant M, PA-C       pantoprazole (PROTONIX) EC tablet 40 mg  40 mg Oral Daily Clinton Gallant M, PA-C   40 mg at 02/13/21 0816   phenol (CHLORASEPTIC) mouth spray 1 spray  1 spray Mouth/Throat PRN Clinton Gallant M, PA-C       potassium chloride SA (KLOR-CON M) CR tablet 20-40 mEq  20-40 mEq Oral Daily PRN Clinton Gallant M, PA-C       potassium chloride SA (KLOR-CON M) CR tablet 40 mEq  40 mEq Oral Once Leonie Douglas, MD       rosuvastatin (CRESTOR) tablet 20 mg  20 mg Oral QHS Clinton Gallant M, PA-C   20 mg at 02/12/21 2051   senna-docusate (Senokot-S) tablet 1 tablet  1 tablet Oral QHS PRN Lars Mage, PA-C         Discharge Medications: Please see discharge summary for a list of discharge medications.  Relevant Imaging Results:  Relevant Lab Results:   Additional Information SSN: [id number]  Lorri Frederick, LCSW

## 2021-02-14 DIAGNOSIS — Z89612 Acquired absence of left leg above knee: Secondary | ICD-10-CM

## 2021-02-14 LAB — BPAM RBC
Blood Product Expiration Date: 202301162359
Blood Product Expiration Date: 202301162359
ISSUE DATE / TIME: 202301151057
ISSUE DATE / TIME: 202301151501
Unit Type and Rh: 9500
Unit Type and Rh: 9500

## 2021-02-14 LAB — BASIC METABOLIC PANEL
Anion gap: 4 — ABNORMAL LOW (ref 5–15)
BUN: 17 mg/dL (ref 8–23)
CO2: 27 mmol/L (ref 22–32)
Calcium: 8 mg/dL — ABNORMAL LOW (ref 8.9–10.3)
Chloride: 108 mmol/L (ref 98–111)
Creatinine, Ser: 0.81 mg/dL (ref 0.61–1.24)
GFR, Estimated: >60 mL/min (ref >60)
Glucose, Bld: 102 mg/dL — ABNORMAL HIGH (ref 70–99)
Potassium: 3.6 mmol/L (ref 3.5–5.1)
Sodium: 139 mmol/L (ref 135–145)

## 2021-02-14 LAB — TYPE AND SCREEN
ABO/RH(D): O POS
Antibody Screen: NEGATIVE
Unit division: 0
Unit division: 0

## 2021-02-14 LAB — CBC
HCT: 32.5 % — ABNORMAL LOW (ref 39.0–52.0)
Hemoglobin: 10.5 g/dL — ABNORMAL LOW (ref 13.0–17.0)
MCH: 26.6 pg (ref 26.0–34.0)
MCHC: 32.3 g/dL (ref 30.0–36.0)
MCV: 82.3 fL (ref 80.0–100.0)
Platelets: 485 10*3/uL — ABNORMAL HIGH (ref 150–400)
RBC: 3.95 MIL/uL — ABNORMAL LOW (ref 4.22–5.81)
RDW: 15.8 % — ABNORMAL HIGH (ref 11.5–15.5)
WBC: 11.1 10*3/uL — ABNORMAL HIGH (ref 4.0–10.5)
nRBC: 0 % (ref 0.0–0.2)

## 2021-02-14 LAB — SURGICAL PATHOLOGY

## 2021-02-14 NOTE — Care Management Important Message (Signed)
Important Message  Patient Details  Name: Ryan [name] MRN: [id number] Date of Birth: July 09, 1950   Medicare Important Message Given:  Yes     Memory Argue 02/14/2021, 3:25 PM

## 2021-02-14 NOTE — Progress Notes (Signed)
Inpatient Diabetes Program Recommendations  AACE/ADA: New Consensus Statement on Inpatient Glycemic Control (2015)  Target Ranges:  Prepandial:   less than 140 mg/dL      Peak postprandial:   less than 180 mg/dL (1-2 hours)      Critically ill patients:  140 - 180 mg/dL   Lab Results  Component Value Date   HGBA1C 6.9 (H) 02/13/2021    Latest Reference Range & Units 02/14/21 00:56  Glucose 70 - 99 mg/dL 175 (H)   Inpatient Diabetes Program Recommendations:    Spoke with patient regarding elevated A1C of 6.9%.  He states he has never been told that his blood sugars are high.  He states that he mostly has been eating chocolate pudding lately.  Fasting blood sugar this morning was 102 mg/dL.  Recommend recheck of A1C in 2-3 months.  At this point, just encouraged patient to not drink sweetened beverages such as juices/sodas.    Thanks,  Beryl Meager, RN, BC-ADM Inpatient Diabetes Coordinator Pager [phone]  (8a-5p)

## 2021-02-14 NOTE — TOC Progression Note (Signed)
Transition of Care [facility]) - Progression Note    Patient Details  Name: Ryan [name] MRN: [id number] Date of Birth: Mar 21, 1950  [facility]) CM/SW Contact  Carley Hammed, [place] Phone Number: 02/14/2021, 3:40 PM  Clinical Narrative:    CSW was notified that family would like to pursue SNF. Family is interested in [facility] or a facility in [place]. Pt has had 1 covid vaccine and has refused the rest. CSW explained insurance and gave medicare.gov information. CSW will fax pt out at this time, TOC will continue to follow for DC needs.   Expected Discharge Plan: Skilled Nursing Facility Barriers to Discharge: SNF Pending bed offer, Continued Medical Work up  Expected Discharge Plan and Services Expected Discharge Plan: Skilled Nursing Facility       Living arrangements for the past 2 months: Single Family Home                                       Social Determinants of Health (SDOH) Interventions    Readmission Risk Interventions Readmission Risk Prevention Plan 01/04/2021  Post Dischage Appt Complete  Medication Screening Complete  Transportation Screening Complete  Some recent data might be hidden

## 2021-02-14 NOTE — Progress Notes (Signed)
Inpatient Rehab Admissions Coordinator:  Informed by social worker Coralee Pesa that pt/family have decided on SNF as discharge setting. AC will sign off.   Gayland Curry, [place], [place] Admissions Coordinator [phone]

## 2021-02-14 NOTE — Progress Notes (Addendum)
[facility] [place]    Assessment/Planning: He has history of acute on chronic left LE ischemia with occluded re-do PTFE bypass.    POD # 3 Left AKA Left AKA healing well without ischemic changes and warm to touch. Pending mobility and PT/OT current recommendations for inpatient rehab.  Pending family decision.     Subjective  - Tender incision to palpation with manipulation at incision line.   Objective 131/60 (!) 59 98.4 F (36.9 C) (Oral) 16 97%  Intake/Output Summary (Last 24 hours) at 02/14/2021 0705 Last data filed at 02/14/2021 6734 Gross per 24 hour  Intake 0 ml  Output 2475 ml  Net -2475 ml      Healing well without ischemic changes Right LE warm without ischemic skin changes Lungs non labored breathing  Mosetta Pigeon 02/14/2021 7:05 AM --  Laboratory Lab Results: Recent Labs    02/13/21 0201 02/14/21 0056  WBC 13.1* 11.1*  HGB 7.0* 10.5*  HCT 22.6* 32.5*  PLT 453* 485*   BMET Recent Labs    02/13/21 0201 02/14/21 0056  NA 137 139  K 3.1* 3.6  CL 109 108  CO2 23 27  GLUCOSE 138* 102*  BUN 23 17  CREATININE 0.96 0.81  CALCIUM 7.9* 8.0*    COAG Lab Results  Component Value Date   INR 1.1 02/11/2021   INR 1.1 12/31/2020   INR 1.0 06/16/2020   No results found for: PTT  I have seen and evaluated the patient. I agree with the PA note as documented above.  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  Hemoglobin 10.5 after transfusion yesterday.  Potassium also improved to 3.6.  Left AKA looks excellent as pictured above.  Pain improving.  PT is recommending rehab.  I strongly encouraged the patient to pursue rehab this morning.  He will further talked over with his wife.  Cephus Shelling, MD [facility] [place] Office: [phone]

## 2021-02-14 NOTE — Anesthesia Postprocedure Evaluation (Signed)
Anesthesia Post Note  Patient: Ryan [name]  Procedure(s) Performed: LEFT ABOVE KNEE AMPUTATION (Left: Knee)     Patient location during evaluation: PACU Anesthesia Type: General Level of consciousness: awake and alert Pain management: pain level controlled Vital Signs Assessment: post-procedure vital signs reviewed and stable Respiratory status: spontaneous breathing, nonlabored ventilation and respiratory function stable Cardiovascular status: blood pressure returned to baseline and stable Postop Assessment: no apparent nausea or vomiting Anesthetic complications: no   No notable events documented.  Last Vitals:  Vitals:   02/14/21 0420 02/14/21 0740  BP: 131/60 (!) 129/50  Pulse: (!) 59 (!) 59  Resp: 16 16  Temp: 36.9 C 37 C  SpO2: 97% 95%    Last Pain:  Vitals:   02/14/21 0740  TempSrc: Oral  PainSc:                  Lowella Curb

## 2021-02-15 ENCOUNTER — Ambulatory Visit: Payer: Medicare Other | Admitting: Vascular Surgery

## 2021-02-15 LAB — CBC
HCT: 36.5 % — ABNORMAL LOW (ref 39.0–52.0)
Hemoglobin: 12.2 g/dL — ABNORMAL LOW (ref 13.0–17.0)
MCH: 27.1 pg (ref 26.0–34.0)
MCHC: 33.4 g/dL (ref 30.0–36.0)
MCV: 80.9 fL (ref 80.0–100.0)
Platelets: 503 10*3/uL — ABNORMAL HIGH (ref 150–400)
RBC: 4.51 MIL/uL (ref 4.22–5.81)
RDW: 15.7 % — ABNORMAL HIGH (ref 11.5–15.5)
WBC: 11.2 10*3/uL — ABNORMAL HIGH (ref 4.0–10.5)
nRBC: 0 % (ref 0.0–0.2)

## 2021-02-15 LAB — BASIC METABOLIC PANEL
Anion gap: 9 (ref 5–15)
BUN: 11 mg/dL (ref 8–23)
CO2: 25 mmol/L (ref 22–32)
Calcium: 8.4 mg/dL — ABNORMAL LOW (ref 8.9–10.3)
Chloride: 104 mmol/L (ref 98–111)
Creatinine, Ser: 0.67 mg/dL (ref 0.61–1.24)
GFR, Estimated: >60 mL/min (ref >60)
Glucose, Bld: 139 mg/dL — ABNORMAL HIGH (ref 70–99)
Potassium: 3 mmol/L — ABNORMAL LOW (ref 3.5–5.1)
Sodium: 138 mmol/L (ref 135–145)

## 2021-02-15 LAB — SARS CORONAVIRUS 2 (TAT 6-24 HRS): SARS Coronavirus 2: NEGATIVE

## 2021-02-15 NOTE — Progress Notes (Addendum)
°  Progress Note    02/15/2021 8:13 AM 4 Days Post-Op  Subjective:  no complaints   Vitals:   02/15/21 0600 02/15/21 0734  BP:  109/67  Pulse: 68 62  Resp: 15 17  Temp:  98.5 F (36.9 C)  SpO2:  94%   Physical Exam: Cardiac:  regular Lungs:  non labored Incisions:  left AKA well appearing, viable flaps. Tender to palpation. No fluid collections Extremities:  well perfused and warm Neurologic: alert and oriented  CBC    Component Value Date/Time   WBC 11.2 (H) 02/15/2021 0732   RBC 4.51 02/15/2021 0732   HGB 12.2 (L) 02/15/2021 0732   HCT 36.5 (L) 02/15/2021 0732   PLT 503 (H) 02/15/2021 0732   MCV 80.9 02/15/2021 0732   MCH 27.1 02/15/2021 0732   MCHC 33.4 02/15/2021 0732   RDW 15.7 (H) 02/15/2021 0732    BMET    Component Value Date/Time   NA 139 02/14/2021 0056   K 3.6 02/14/2021 0056   CL 108 02/14/2021 0056   CO2 27 02/14/2021 0056   GLUCOSE 102 (H) 02/14/2021 0056   BUN 17 02/14/2021 0056   CREATININE 0.81 02/14/2021 0056   CALCIUM 8.0 (L) 02/14/2021 0056   GFRNONAA >60 02/14/2021 0056   GFRAA >60 09/23/2019 0455    INR    Component Value Date/Time   INR 1.1 02/11/2021 0602     Intake/Output Summary (Last 24 hours) at 02/15/2021 0813 Last data filed at 02/15/2021 0500 Gross per 24 hour  Intake 240 ml  Output 2000 ml  Net -1760 ml     Assessment/Plan:  71 y.o. male is s/p Left AKA 4 Days Post-Op   Doing well post op Pain overall well controlled Hemodynamically stable Left AKA well appearing, viable flaps Stump stocking ordered Family has decided on SNF over CIR Pending auth for SNF Possible discharge later today if SNF option available Will have follow up in 4 weeks in our office for staple removal  Marval Regal [facility] [phone] 02/15/2021  I have seen and evaluated the patient. I agree with the PA note as documented above.  Status post left above-knee amputation.  The amputation looks excellent.   Family has decided on SNF over CIR.  Awaiting authorization and SNF placement.  Looks good from my standpoint and pain control improving.  Marty Heck, MD [facility] [place] Office: [phone]

## 2021-02-15 NOTE — Progress Notes (Signed)
Physical Therapy Treatment Patient Details Name: Ryan [name] MRN: [id number] DOB: [birth year] Today's Date: 02/15/2021   History of Present Illness The pt is a 71 yo male presenting 1/13 for L AKA due to critical limb ischemia after multiple failed revascularization attempts (most reccently 12/31/2020). PMH includes: HTN, PVD, and tobacco use.    PT Comments    Pt making steady progress with mobility. Expect he will continue this progress and eventually modified independent level with mobility. Pt/wife decided against inpatient rehab and have opted for SNF.    Recommendations for follow up therapy are one component of a multi-disciplinary discharge planning process, led by the attending physician.  Recommendations may be updated based on patient status, additional functional criteria and insurance authorization.  Follow Up Recommendations  Skilled nursing-short term rehab (<3 hours/day) (pt declined acute inpatient rehab)     Assistance Recommended at Discharge Frequent or constant Supervision/Assistance  Patient can return home with the following A little help with walking and/or transfers;Help with stairs or ramp for entrance   Equipment Recommendations  Rolling walker (2 wheels);Wheelchair (measurements PT);Wheelchair cushion (measurements PT)    Recommendations for Other Services       Precautions / Restrictions Precautions Precautions: Fall Precaution Comments: L AKA Required Braces or Orthoses: Other Brace Other Brace: has AKA retention sock but doesn't want to wear Restrictions Weight Bearing Restrictions: Yes LLE Weight Bearing: Non weight bearing (AKA)     Mobility  Bed Mobility Overal bed mobility: Needs Assistance Bed Mobility: Supine to Sit     Supine to sit: Min guard     General bed mobility comments: Assist for safety and incr time to perform    Transfers Overall transfer level: Needs assistance Equipment used: Rolling walker (2 wheels), Ambulation  equipment used Transfers: Sit to/from Stand, Bed to chair/wheelchair/BSC Sit to Stand: Min guard, Min assist, +2 safety/equipment          Lateral/Scoot Transfers: Min assist General transfer comment: Min assist to prevent posterior loss of balance of trunk scooting bed to chair. Stood from chair with Stedy requiring min guard for safety. Stood from chair with walker with min assist for balance and support when pt transitioning hands from chair to walker.    Ambulation/Gait Ambulation/Gait assistance: Min assist, +2 safety/equipment Gait Distance (Feet): 7 Feet Assistive device: Rolling walker (2 wheels) Gait Pattern/deviations: Step-to pattern Gait velocity: decr Gait velocity interpretation: <1.31 ft/sec, indicative of household ambulator Pre-gait activities: Stood in [place] x 3-4 minutes. Had pt perform reaching activities in standing with Stedy General Gait Details: Assist for balance and stability with pt performing hop to gait.   Stairs             Wheelchair Mobility    Modified Rankin (Stroke Patients Only)       Balance Overall balance assessment: Needs assistance Sitting-balance support: No upper extremity supported Sitting balance-Leahy Scale: Good     Standing balance support: Single extremity supported Standing balance-Leahy Scale: Poor Standing balance comment: UE support and min guard for static standing                            Cognition Arousal/Alertness: Awake/alert Behavior During Therapy: WFL for tasks assessed/performed Overall Cognitive Status: Within Functional Limits for tasks assessed  Exercises Amputee Exercises Hip Extension: AROM, Left, 5 reps, Standing Hip Flexion/Marching: AROM, Left, 10 reps, Standing    General Comments General comments (skin integrity, edema, etc.): VSS, wife present during session      Pertinent Vitals/Pain Pain Assessment Pain  Assessment: Faces Faces Pain Scale: Hurts little more Pain Location: L residual limb and phantom pain Pain Descriptors / Indicators: Discomfort, Grimacing Pain Intervention(s): Limited activity within patient's tolerance, Repositioned    Home Living                          Prior Function            PT Goals (current goals can now be found in the care plan section) Acute Rehab PT Goals Patient Stated Goal: return home Progress towards PT goals: Progressing toward goals    Frequency    Min 3X/week      PT Plan Discharge plan needs to be updated    Co-evaluation   Reason for Co-Treatment: To address functional/ADL transfers   OT goals addressed during session: ADL's and self-care      AM-PAC PT "6 Clicks" Mobility   Outcome Measure  Help needed turning from your back to your side while in a flat bed without using bedrails?: None Help needed moving from lying on your back to sitting on the side of a flat bed without using bedrails?: A Little Help needed moving to and from a bed to a chair (including a wheelchair)?: A Little Help needed standing up from a chair using your arms (e.g., wheelchair or bedside chair)?: A Little Help needed to walk in hospital room?: Total Help needed climbing 3-5 steps with a railing? : Total 6 Click Score: 15    End of Session Equipment Utilized During Treatment: Gait belt Activity Tolerance: Patient tolerated treatment well Patient left: in chair;with call bell/phone within reach;with chair alarm set;with family/visitor present Nurse Communication: Mobility status PT Visit Diagnosis: Other abnormalities of gait and mobility (R26.89);Unsteadiness on feet (R26.81);Muscle weakness (generalized) (M62.81);Pain Pain - Right/Left: Left Pain - part of body: Leg     Time: 5974-1638 PT Time Calculation (min) (ACUTE ONLY): 44 min  Charges:  $Therapeutic Activity: 8-22 mins                     [facility] PT Acute  Rehabilitation Services Pager [phone] Office [phone]    Angelina Ok [facility] 02/15/2021, 4:17 PM

## 2021-02-15 NOTE — Progress Notes (Signed)
Mobility Specialist: Progress Note   02/15/21 1729  Mobility  Activity Ambulated with assistance in hallway  Level of Assistance Minimal assist, patient does 75% or more  Assistive Device Front wheel walker  Distance Ambulated (ft) 70 ft  Activity Response Tolerated well  $Mobility charge 1 Mobility   Pre-Mobility: 63 HR Post-Mobility: 56 HR  Pt independent with bed mobility and minA to stand. Pt c/o 8/10 L stump pain, RN notified. No other c/o throughout. Pt back to bed after mobility with call bell at his side.   [facility] Ryan [name] Mobility Specialist Mobility Specialist 4 [place]: [phone] Mobility Specialist 2 [place]: [phone]

## 2021-02-15 NOTE — Plan of Care (Signed)
  Problem: Activity: Goal: Risk for activity intolerance will decrease Outcome: Progressing   Problem: Nutrition: Goal: Adequate nutrition will be maintained Outcome: Progressing   Problem: Elimination: Goal: Will not experience complications related to bowel motility Outcome: Progressing   

## 2021-02-15 NOTE — TOC Progression Note (Addendum)
Transition of Care [facility]) - Progression Note    Patient Details  Name: Ryan [name] MRN: [id number] Date of Birth: 15-Mar-1950  Transition of Care [facility]) CM/SW [place], [place] Phone Number: 02/15/2021, 10:23 AM  Clinical Narrative:     CSW met with pt bedside with his wife on speaker phone. Provided SNF offers and explained transition process. Pt and wife would like to discuss with each other further regarding facility choice. CSW will follow up later today for choice.   1430: CSW met with pt and pt wife bedside. They choose [facility]. CSW contacted [facility] and confirmed they can take pt tomorrow. Will need covid test within 24hours of DC as well as printed and signed copy of any narcotic prescriptions.   Expected Discharge Plan: [place] Barriers to Discharge: SNF Pending bed offer, Continued Medical Work up  Expected Discharge Plan and Services Expected Discharge Plan: [place] arrangements for the past 2 months: Single Family Home                                       Social Determinants of Health (SDOH) Interventions    Readmission Risk Interventions Readmission Risk Prevention Plan 01/04/2021  Post Dischage Appt Complete  Medication Screening Complete  Transportation Screening Complete  Some recent data might be hidden

## 2021-02-15 NOTE — Plan of Care (Signed)
  Problem: Health Behavior/Discharge Planning: Goal: Ability to manage health-related needs will improve Outcome: Progressing   Problem: Clinical Measurements: Goal: Will remain free from infection Outcome: Progressing Goal: Cardiovascular complication will be avoided Outcome: Progressing   Problem: Activity: Goal: Risk for activity intolerance will decrease Outcome: Progressing   

## 2021-02-15 NOTE — Progress Notes (Signed)
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 PMH includes: HTN, PVD, and tobacco use.   OT comments  Pt seen in conjunction with PT to maximize pts participation and progress functional mobility. Pt continues to present with increased pain, decreased ability to care for self, and impaired activity tolerance. Pt able to scoot to recliner from EOB>with min guard assist +2 for safety as pt present with excellent UB strength. Pt also able to stand to stedy with min guard assist +2 for safety as well as stand to RW with MIN A +2 and take hops forward with chair follow. Pt would benefit from increased AKA education in terms of positioning as pt prefers to have AKA in hip flexion. Pt would benefit from structure and intensity of AIR but per chart review family may be leaning towards SNF. Will follow acutely per POC and update Dc recs as needed.    Recommendations for follow up therapy are one component of a multi-disciplinary discharge planning process, led by the attending physician.  Recommendations may be updated based on patient status, additional functional criteria and insurance authorization.    Follow Up Recommendations  Acute inpatient rehab (3hours/day)    Assistance Recommended at Discharge Frequent or constant Supervision/Assistance  Patient can return home with the following  A lot of help with bathing/dressing/bathroom;A lot of help with walking and/or transfers;Assist for transportation;Assistance with cooking/housework   Equipment Recommendations  Other (comment) (TBD next venue)    Recommendations for Other Services      Precautions / Restrictions Precautions Precautions: Fall Precaution Comments: L AKA Required Braces  or Orthoses: Other Brace Other Brace: has AKA retention sock but doesn't want to wear Restrictions Weight Bearing Restrictions: Yes LLE Weight Bearing: Non weight bearing (AKA)       Mobility Bed Mobility Overal bed mobility: Needs Assistance Bed Mobility: Supine to Sit     Supine to sit: Min guard     General bed mobility comments: min guard for safety, cues for sequencing but able to complete task from flat HOB    Transfers Overall transfer level: Needs assistance Equipment used: Rolling walker (2 wheels), Ambulation equipment used Transfers: Sit to/from Stand, Bed to chair/wheelchair/BSC Sit to Stand: Min guard, Min assist, +2 safety/equipment (min guard with stedy, MIN A with RW)          Lateral/Scoot Transfers: Min guard (EOB>recliner) General transfer comment: pt able to scoot from EOB>recliner via lateral scoot with min guard assist, stand to stedy with min guard +2 for safety and MIN A +2 for safety to stand to RW. pt even able to take hops forward with chair follow with MIN A +2 for safety     Balance Overall balance assessment: Needs assistance Sitting-balance support: No upper extremity supported Sitting balance-Leahy Scale: Good     Standing balance support: Single extremity supported Standing balance-Leahy Scale: Fair Standing balance comment: able to reach out of BOS with one UE supported with MIN A in stedy                           ADL either performed or assessed with clinical judgement   ADL Overall ADL's : Needs assistance/impaired  Toilet Transfer: Chartered certified accountant for safety/equipment Toilet Transfer Details (indicate cue type and reason): simulated via functional mobility with pt able to scoot to recliner, stand piovt and takek hops forward with chair follow         Functional mobility during ADLs: Minimal assistance;Min guard;+2 for safety/equipment;Rolling walker (2  wheels) General ADL Comments: pt continues to present with decreased activity tolerance, increased pain and decreased ability to care for self    Extremity/Trunk Assessment Upper Extremity Assessment Upper Extremity Assessment: Overall WFL for tasks assessed (good strength in BUEs)   Lower Extremity Assessment Lower Extremity Assessment: Defer to PT evaluation        Vision Patient Visual Report: No change from baseline     Perception Perception Perception: Within Functional Limits   Praxis Praxis Praxis: Intact    Cognition Arousal/Alertness: Awake/alert Behavior During Therapy: WFL for tasks assessed/performed Overall Cognitive Status: Within Functional Limits for tasks assessed                                 General Comments: good body awareness and follows commands with increased time to process at times        Exercises      Shoulder Instructions       General Comments VSS, wife present during session    Pertinent Vitals/ Pain       Pain Assessment Pain Assessment: Faces Faces Pain Scale: Hurts little more Pain Location: L AKA Pain Descriptors / Indicators: Discomfort, Grimacing Pain Intervention(s): Monitored during session, Repositioned  Home Living                                          Prior Functioning/Environment              Frequency  Min 2X/week        Progress Toward Goals  OT Goals(current goals can now be found in the care plan section)  Progress towards OT goals: Progressing toward goals  Acute Rehab OT Goals Patient Stated Goal: none stated Time For Goal Achievement: 02/26/21 Potential to Achieve Goals: Good  Plan Discharge plan remains appropriate;Frequency remains appropriate    Co-evaluation    PT/OT/SLP Co-Evaluation/Treatment: Yes Reason for Co-Treatment: To address functional/ADL transfers   OT goals addressed during session: ADL's and self-care      AM-PAC OT "6 Clicks"  Daily Activity     Outcome Measure   Help from another person eating meals?: None Help from another person taking care of personal grooming?: A Little Help from another person toileting, which includes using toliet, bedpan, or urinal?: A Little Help from another person bathing (including washing, rinsing, drying)?: A Little Help from another person to put on and taking off regular upper body clothing?: None Help from another person to put on and taking off regular lower body clothing?: A Lot 6 Click Score: 19    End of Session Equipment Utilized During Treatment: Gait belt;Rolling walker (2 wheels);Other (comment) (stedy)  OT Visit Diagnosis: Unsteadiness on feet (R26.81);Other abnormalities of gait and mobility (R26.89);Muscle weakness (generalized) (M62.81);Pain Pain - Right/Left: Left Pain - part of body:  (L AKA)   Activity Tolerance Patient tolerated treatment well   Patient Left in chair;with call bell/phone within reach;with chair alarm set   Nurse Communication Mobility status;Other (comment) (can use stedy, lateral  scoot for RW for back to bed with +2 for safety)        Time: 1319-1350 OT Time Calculation (min): 31 min  Charges: OT General Charges $OT Visit: 1 Visit OT Treatments $Therapeutic Activity: 8-22 mins  Harley Alto., COTA/L Acute Rehabilitation Services [phone]   Precious Haws 02/15/2021, 2:25 PM

## 2021-02-15 NOTE — Progress Notes (Signed)
Orthopedic Tech Progress Note Patient Details:  Ryan [name] September 15, 1950 [id number]  Called in order to [facility] for an AKA RETENTION SOCK   Patient ID: Ryan [name], male   DOB: [birth year], 71 y.o.   MRN: [id number]  [name] 02/15/2021, 10:11 AM

## 2021-02-16 DIAGNOSIS — E441 Mild protein-calorie malnutrition: Secondary | ICD-10-CM | POA: Diagnosis not present

## 2021-02-16 DIAGNOSIS — I70229 Atherosclerosis of native arteries of extremities with rest pain, unspecified extremity: Secondary | ICD-10-CM | POA: Diagnosis not present

## 2021-02-16 DIAGNOSIS — R1319 Other dysphagia: Secondary | ICD-10-CM | POA: Diagnosis not present

## 2021-02-16 DIAGNOSIS — R5381 Other malaise: Secondary | ICD-10-CM | POA: Diagnosis not present

## 2021-02-16 DIAGNOSIS — R262 Difficulty in walking, not elsewhere classified: Secondary | ICD-10-CM | POA: Diagnosis not present

## 2021-02-16 DIAGNOSIS — Z79899 Other long term (current) drug therapy: Secondary | ICD-10-CM | POA: Diagnosis not present

## 2021-02-16 DIAGNOSIS — Z89612 Acquired absence of left leg above knee: Secondary | ICD-10-CM | POA: Diagnosis not present

## 2021-02-16 DIAGNOSIS — E118 Type 2 diabetes mellitus with unspecified complications: Secondary | ICD-10-CM | POA: Diagnosis not present

## 2021-02-16 DIAGNOSIS — M6281 Muscle weakness (generalized): Secondary | ICD-10-CM | POA: Diagnosis not present

## 2021-02-16 DIAGNOSIS — E785 Hyperlipidemia, unspecified: Secondary | ICD-10-CM | POA: Diagnosis not present

## 2021-02-16 DIAGNOSIS — I10 Essential (primary) hypertension: Secondary | ICD-10-CM | POA: Diagnosis not present

## 2021-02-16 DIAGNOSIS — M2550 Pain in unspecified joint: Secondary | ICD-10-CM | POA: Diagnosis not present

## 2021-02-16 DIAGNOSIS — I739 Peripheral vascular disease, unspecified: Secondary | ICD-10-CM | POA: Diagnosis not present

## 2021-02-16 DIAGNOSIS — I959 Hypotension, unspecified: Secondary | ICD-10-CM | POA: Diagnosis not present

## 2021-02-16 DIAGNOSIS — Z4781 Encounter for orthopedic aftercare following surgical amputation: Secondary | ICD-10-CM | POA: Diagnosis not present

## 2021-02-16 DIAGNOSIS — Z7401 Bed confinement status: Secondary | ICD-10-CM | POA: Diagnosis not present

## 2021-02-16 LAB — BASIC METABOLIC PANEL
Anion gap: 7 (ref 5–15)
BUN: 15 mg/dL (ref 8–23)
CO2: 26 mmol/L (ref 22–32)
Calcium: 8.4 mg/dL — ABNORMAL LOW (ref 8.9–10.3)
Chloride: 103 mmol/L (ref 98–111)
Creatinine, Ser: 0.73 mg/dL (ref 0.61–1.24)
GFR, Estimated: >60 mL/min (ref >60)
Glucose, Bld: 225 mg/dL — ABNORMAL HIGH (ref 70–99)
Potassium: 3.6 mmol/L (ref 3.5–5.1)
Sodium: 136 mmol/L (ref 135–145)

## 2021-02-16 MED ORDER — HYDROCODONE-ACETAMINOPHEN 5-325 MG PO TABS: 1.0000 | ORAL_TABLET | ORAL | 0 refills | Status: AC | PRN

## 2021-02-16 MED ORDER — METFORMIN HCL 500 MG PO TABS: 500.0000 mg | ORAL_TABLET | Freq: Every day | ORAL | 0 refills | Status: AC

## 2021-02-16 NOTE — Progress Notes (Addendum)
[facility] [place]  Subjective  - Left limb still sore, but getting a little better with time.   Objective (!) 92/58 67 98.5 F (36.9 C) (Oral) 17 93%  Intake/Output Summary (Last 24 hours) at 02/16/2021 0737 Last data filed at 02/16/2021 0547 Gross per 24 hour  Intake 360 ml  Output 1600 ml  Net -1240 ml    Left AKA healing well without ischemic changes.  Stump warm to touch. Lungs non labored Heart RRR  Assessment/Planning: POD #5 left AKA   Left stump appears viable, warm tot ouch and well perfused Stump sock order  Pending SNF placement New diagnosis of DM2 A1c 6.9 on 02/13/21.  While inpatient, please order CBGs with Novolog 0-6 units TID with meals and Novolog 0-5 units QHS. I will start Metformin prior to discharge and he can f/u with his PCP.   K+ 3.0 02/15/21 will order stat bmet and treat as needed. F/U 4 weeks post op for staple removal  Ryan [name] 02/16/2021 7:37 AM --  Laboratory Lab Results: Recent Labs    02/14/21 0056 02/15/21 0732  WBC 11.1* 11.2*  HGB 10.5* 12.2*  HCT 32.5* 36.5*  PLT 485* 503*   BMET Recent Labs    02/14/21 0056 02/15/21 0732  NA 139 138  K 3.6 3.0*  CL 108 104  CO2 27 25  GLUCOSE 102* 139*  BUN 17 11  CREATININE 0.81 0.67  CALCIUM 8.0* 8.4*    COAG Lab Results  Component Value Date   INR 1.1 02/11/2021   INR 1.1 12/31/2020   INR 1.0 06/16/2020   No results found for: PTT  VASCULAR STAFF ADDENDUM: I have independently interviewed and examined the patient. I agree with the above.   [name], MD [facility] Phone Number: [phone] 02/16/2021 12:32 PM  Addendum It was brought to my attention that this patient had an elevated Cr 1.7 on 02/11/21 the prior CR was 0.95 and following Cr was recorded at <1.0.  AKI incidental.  Patient has returned to base line normal Cr 0.81 at discharge.  No intervention was needed.   Ryan Pigeon PA-C

## 2021-02-16 NOTE — Discharge Summary (Signed)
[facility] Discharge Summary   Patient ID:  Ryan [name] MRN: [id number] DOB/AGE: August 21, 1950 71 y.o.  Admit date: 02/11/2021 Discharge date: 02/16/21 Date of Surgery: 02/11/2021 Surgeon: Surgeon(s): Cephus Shelling, MD  Admission Diagnosis: PAD (peripheral artery disease) [facility]) [I73.9]  Discharge Diagnoses:  PAD (peripheral artery disease) (HCC) [I73.9]  Secondary Diagnoses: Past Medical History:  Diagnosis Date   COVID 2022   Hypertension    PVD (peripheral vascular disease) (HCC)     Procedure(s): LEFT ABOVE KNEE AMPUTATION  Discharged Condition: stable  HPI: 71 y/o male with history of redo exposure left common femoral artery, GSV harvest, thrombectomy of left common femoral artery to below-knee popliteal artery prosthetic bypass (including removal of proximal stent in bypass).  Jump graft from the below-knee popliteal prosthetic bypass to the peroneal artery using saphenous vein interposition for composite bypass graft by Dr. Chestine Spore 12/31/20.   Hospital Course:  Ryan [name] is a 71 y.o. male has undergone several attempts at revascularization and failed.  He developed ischemic wounds on the left foot and edema.  He had weak monophasic left peroneal signals on admission.   DR. Chestine Spore discussed his concern that his inflow was not likely enough to heal his wounds and suggested Left AKA amputation.      Procedure(s): LEFT ABOVE KNEE AMPUTATION For non viable left LE. The left AKA stump is warm and healing well appears viable. Staples will be maintained for 4 weeks  He is being discharged to SNF for continued rehab. New diagnosis of DM2 A1c 6.9 on 02/13/21.  While inpatient, please order CBGs with Novolog 0-6 units TID with meals and Novolog 0-5 units QHS. I will start Metformin prior to discharge and he can f/u with his PCP.    Treatment Team:  Leonie Douglas, MD  Significant Diagnostic Studies: CBC Lab Results  Component Value Date   WBC  11.2 (H) 02/15/2021   HGB 12.2 (L) 02/15/2021   HCT 36.5 (L) 02/15/2021   MCV 80.9 02/15/2021   PLT 503 (H) 02/15/2021    BMET    Component Value Date/Time   NA 136 02/16/2021 0850   K 3.6 02/16/2021 0850   CL 103 02/16/2021 0850   CO2 26 02/16/2021 0850   GLUCOSE 225 (H) 02/16/2021 0850   BUN 15 02/16/2021 0850   CREATININE 0.73 02/16/2021 0850   CALCIUM 8.4 (L) 02/16/2021 0850   GFRNONAA >60 02/16/2021 0850   GFRAA >60 09/23/2019 0455   COAG Lab Results  Component Value Date   INR 1.1 02/11/2021   INR 1.1 12/31/2020   INR 1.0 06/16/2020     Disposition:  Discharge to :Skilled nursing facility Discharge Instructions     Call MD for:  redness, tenderness, or signs of infection (pain, swelling, bleeding, redness, odor or green/yellow discharge around incision site)   Complete by: As directed    Call MD for:  severe or increased pain, loss or decreased feeling  in affected limb(s)   Complete by: As directed    Call MD for:  temperature >100.5   Complete by: As directed    Resume previous diet   Complete by: As directed       Allergies as of 02/16/2021   No Known Allergies      Medication List     TAKE these medications    aspirin 81 MG EC tablet Take 1 tablet (81 mg total) by mouth daily at 6 (six) AM. Swallow whole.   atenolol 50 MG tablet  Commonly known as: TENORMIN Take 50 mg by mouth in the morning.   HYDROcodone-acetaminophen 5-325 MG tablet Commonly known as: Norco Take 1 tablet by mouth every 4 (four) hours as needed for moderate pain. What changed: when to take this   losartan-hydrochlorothiazide 100-25 MG tablet Commonly known as: HYZAAR Take 1 tablet by mouth in the morning.   metFORMIN 500 MG tablet Commonly known as: Glucophage Take 1 tablet (500 mg total) by mouth daily with breakfast.   rosuvastatin 20 MG tablet Commonly known as: CRESTOR Take 20 mg by mouth at bedtime.       Verbal and written Discharge instructions given  to the patient. Wound care per Discharge AVS  Follow-up Information     Cephus Shelling, MD Follow up in 3 week(s).   Specialty: Vascular Surgery Why: Office will call you to arrange your appt (sent) Contact information: [address] [place] [address] [phone]                 Signed: Mosetta Pigeon 02/16/2021, 10:28 AM

## 2021-02-16 NOTE — Plan of Care (Signed)
°  Problem: Education: Goal: Knowledge of General Education information will improve Description: Including pain rating scale, medication(s)/side effects and non-pharmacologic comfort measures Outcome: Adequate for Discharge   Problem: Health Behavior/Discharge Planning: Goal: Ability to manage health-related needs will improve Outcome: Adequate for Discharge   Problem: Clinical Measurements: Goal: Ability to maintain clinical measurements within normal limits will improve Outcome: Adequate for Discharge Goal: Will remain free from infection Outcome: Adequate for Discharge Goal: Diagnostic test results will improve Outcome: Adequate for Discharge Goal: Respiratory complications will improve Outcome: Adequate for Discharge Goal: Cardiovascular complication will be avoided Outcome: Adequate for Discharge   Problem: Activity: Goal: Risk for activity intolerance will decrease Outcome: Adequate for Discharge   Problem: Nutrition: Goal: Adequate nutrition will be maintained Outcome: Adequate for Discharge   Problem: Coping: Goal: Level of anxiety will decrease Outcome: Adequate for Discharge   Problem: Safety: Goal: Ability to remain free from injury will improve Outcome: Adequate for Discharge   Problem: Pain Managment: Goal: General experience of comfort will improve Outcome: Adequate for Discharge   Problem: Elimination: Goal: Will not experience complications related to bowel motility Outcome: Adequate for Discharge Goal: Will not experience complications related to urinary retention Outcome: Adequate for Discharge   Problem: Skin Integrity: Goal: Risk for impaired skin integrity will decrease Outcome: Adequate for Discharge

## 2021-02-16 NOTE — Progress Notes (Signed)
Mobility Specialist: Progress Note   02/16/21 1122  Mobility  Activity Ambulated with assistance in hallway  Level of Assistance Minimal assist, patient does 75% or more  Assistive Device Front wheel walker  Distance Ambulated (ft) 70 ft  Activity Response Tolerated well  $Mobility charge 1 Mobility   Pre-Mobility: 81 HR Post-Mobility: 84 HR, 93/54 (67) BP, 96% SpO2  Pt independent with bed mobility and minA to stand. Pt c/o feeling some dizziness during ambulation as well as 8/10 pain in L stump. Pt back to bed after session with call bell at his side.   [facility] Mobility Specialist Mobility Specialist 4 [place]: [phone] Mobility Specialist 2 [place] and 6 [place]: [phone]

## 2021-02-16 NOTE — TOC Transition Note (Signed)
Transition of Care [facility]) - CM/SW Discharge Note   Patient Details  Name: Ryan [name] MRN: [id number] Date of Birth: [birth year]  Transition of Care [facility]) CM/SW Contact:  Eduard Roux, LCSW Phone Number: 02/16/2021, 1:14 PM   Clinical Narrative:     Patient will Discharge to: [facility] Discharge Date: 02/16/2021 Family Notified: spouse Transport [id number]:UYQI Please call spouse when patient is leaving the unit  Per MD patient is ready for discharge. RN, patient, and facility notified of discharge. Discharge Summary sent to facility. RN given number for [phone], Room 105. Ambulance transport requested for patient.   Clinical Social Worker signing off.  Antony Blackbird, MSW, LCSW Clinical Social Worker    Final next level of care: Skilled Nursing Facility Barriers to Discharge: Barriers Resolved   Patient Goals and CMS Choice Patient states their goals for this hospitalization and ongoing recovery are:: Pt and family would like for pt to return home independently. CMS Medicare.gov Compare Post Acute Care list provided to:: Patient Represenative (must comment) (Spouse)    Discharge Placement              Patient chooses bed at: [facility] Patient to be transferred to facility by: [facility] Name of family member notified: spouse Patient and family notified of of transfer: 02/16/21  Discharge Plan and Services                                     Social Determinants of Health (SDOH) Interventions     Readmission Risk Interventions Readmission Risk Prevention Plan 01/04/2021  Post Dischage Appt Complete  Medication Screening Complete  Transportation Screening Complete  Some recent data might be hidden

## 2021-02-24 DIAGNOSIS — E441 Mild protein-calorie malnutrition: Secondary | ICD-10-CM | POA: Diagnosis not present

## 2021-02-24 DIAGNOSIS — I739 Peripheral vascular disease, unspecified: Secondary | ICD-10-CM | POA: Diagnosis not present

## 2021-02-24 DIAGNOSIS — R5381 Other malaise: Secondary | ICD-10-CM | POA: Diagnosis not present

## 2021-02-24 DIAGNOSIS — Z89612 Acquired absence of left leg above knee: Secondary | ICD-10-CM | POA: Diagnosis not present

## 2021-02-28 DIAGNOSIS — E118 Type 2 diabetes mellitus with unspecified complications: Secondary | ICD-10-CM | POA: Diagnosis not present

## 2021-02-28 DIAGNOSIS — Z79899 Other long term (current) drug therapy: Secondary | ICD-10-CM | POA: Diagnosis not present

## 2021-03-01 DIAGNOSIS — I739 Peripheral vascular disease, unspecified: Secondary | ICD-10-CM | POA: Diagnosis not present

## 2021-03-01 DIAGNOSIS — Z89612 Acquired absence of left leg above knee: Secondary | ICD-10-CM | POA: Diagnosis not present

## 2021-03-03 DIAGNOSIS — E441 Mild protein-calorie malnutrition: Secondary | ICD-10-CM | POA: Diagnosis not present

## 2021-03-03 DIAGNOSIS — R5381 Other malaise: Secondary | ICD-10-CM | POA: Diagnosis not present

## 2021-03-03 DIAGNOSIS — I739 Peripheral vascular disease, unspecified: Secondary | ICD-10-CM | POA: Diagnosis not present

## 2021-03-03 DIAGNOSIS — Z89612 Acquired absence of left leg above knee: Secondary | ICD-10-CM | POA: Diagnosis not present

## 2021-03-08 ENCOUNTER — Ambulatory Visit (INDEPENDENT_AMBULATORY_CARE_PROVIDER_SITE_OTHER): Payer: Medicare Other | Admitting: Physician Assistant

## 2021-03-08 ENCOUNTER — Other Ambulatory Visit: Payer: Self-pay

## 2021-03-08 VITALS — BP 90/51 | HR 66 | Temp 98.0°F | Resp 18 | Ht 66.0 in | Wt 120.0 lb

## 2021-03-08 DIAGNOSIS — I739 Peripheral vascular disease, unspecified: Secondary | ICD-10-CM

## 2021-03-08 MED ORDER — TRAMADOL HCL 50 MG PO TABS: 50.0000 mg | ORAL_TABLET | Freq: Three times a day (TID) | ORAL | 0 refills | Status: AC | PRN

## 2021-03-08 NOTE — Progress Notes (Signed)
°  POST OPERATIVE OFFICE NOTE    CC:  F/u for surgery  HPI:  [name] who is s/p left AKA on 02/11/21 by Dr. Chestine Spore secondary to critical ischia after several failed revascularization attempts.  He is still having pain and feels like the leg is still there.  He was released from rehab on 03/04/21.  He was taking pain medication every 3 hours according to the patient.  He denies drainage, erythema or edema in the left AKA stump.    No Known Allergies  Current Outpatient Medications  Medication Sig Dispense Refill   aspirin EC 81 MG EC tablet Take 1 tablet (81 mg total) by mouth daily at 6 (six) AM. Swallow whole.     atenolol (TENORMIN) 50 MG tablet Take 50 mg by mouth in the morning.     HYDROcodone-acetaminophen (NORCO) 5-325 MG tablet Take 1 tablet by mouth every 4 (four) hours as needed for moderate pain. 30 tablet 0   losartan-hydrochlorothiazide (HYZAAR) 100-25 MG tablet Take 1 tablet by mouth in the morning.     metFORMIN (GLUCOPHAGE) 500 MG tablet Take 1 tablet (500 mg total) by mouth daily with breakfast. 30 tablet 0   rosuvastatin (CRESTOR) 20 MG tablet Take 20 mg by mouth at bedtime.     No current facility-administered medications for this visit.     ROS:  See HPI  Physical Exam:     Incision:  healing very well without ischemic changes. Extremities:  warm and well perfused left AKA stump  slight tenderness to movement and touch, no fluctuance.  Lungs non labored breathing Heart RRR   Assessment/Plan:  [name] who is s/p:Left AKA s/p multiple failed re vascularization attempts.   He will f/u in 1 week for staple removal.  I sent # 30 tramadol 1 q 8 hrs. PRN for pain.  I asked him to take one 30 min. Before coming for staple removal.  Once he is out of tramadol I hope his pain will be controlled with tylenol.     He wants to be referred to [facility] for prosthetic consideration and stump sock after the staples are removed on his next visit.  He  states he has been walking with a rolling walker in rehab.    Mosetta Pigeon PA-C [facility] [phone]   Clinic MD:  Chestine Spore

## 2021-03-14 DIAGNOSIS — I10 Essential (primary) hypertension: Secondary | ICD-10-CM | POA: Diagnosis not present

## 2021-03-14 DIAGNOSIS — I739 Peripheral vascular disease, unspecified: Secondary | ICD-10-CM | POA: Diagnosis not present

## 2021-03-14 DIAGNOSIS — E1169 Type 2 diabetes mellitus with other specified complication: Secondary | ICD-10-CM | POA: Diagnosis not present

## 2021-03-15 ENCOUNTER — Ambulatory Visit (INDEPENDENT_AMBULATORY_CARE_PROVIDER_SITE_OTHER): Payer: Medicare Other | Admitting: Physician Assistant

## 2021-03-15 ENCOUNTER — Other Ambulatory Visit: Payer: Self-pay

## 2021-03-15 VITALS — BP 92/53 | HR 54 | Temp 97.7°F | Resp 18 | Ht 66.0 in | Wt 120.0 lb

## 2021-03-15 DIAGNOSIS — Z89612 Acquired absence of left leg above knee: Secondary | ICD-10-CM

## 2021-03-15 DIAGNOSIS — I739 Peripheral vascular disease, unspecified: Secondary | ICD-10-CM

## 2021-03-15 NOTE — Progress Notes (Signed)
°  POST OPERATIVE OFFICE NOTE    CC:  F/u for surgery  HPI:  [name] who is s/p left AKA 02/11/21 and returns today for staple removal. He was seen last week and AKA was healing very well but was too early for staple removal. He was having some phantom pain so was given new Rx for tramadol. Today he reports pain is improved. He has not had any drainage, bleeding,or redness in left AKA stump.  No Known Allergies  Current Outpatient Medications  Medication Sig Dispense Refill   aspirin EC 81 MG EC tablet Take 1 tablet (81 mg total) by mouth daily at 6 (six) AM. Swallow whole.     atenolol (TENORMIN) 50 MG tablet Take 50 mg by mouth in the morning.     HYDROcodone-acetaminophen (NORCO) 5-325 MG tablet Take 1 tablet by mouth every 4 (four) hours as needed for moderate pain. 30 tablet 0   losartan-hydrochlorothiazide (HYZAAR) 100-25 MG tablet Take 1 tablet by mouth in the morning.     metFORMIN (GLUCOPHAGE) 500 MG tablet Take 1 tablet (500 mg total) by mouth daily with breakfast. 30 tablet 0   rosuvastatin (CRESTOR) 20 MG tablet Take 20 mg by mouth at bedtime.     traMADol (ULTRAM) 50 MG tablet Take 1 tablet (50 mg total) by mouth every 8 (eight) hours as needed. 30 tablet 0   No current facility-administered medications for this visit.     ROS:  See HPI  Physical Exam:  Vitals:   03/15/21 0930  BP: (!) 92/53  Pulse: (!) 54  Resp: 18  Temp: 97.7 F (36.5 C)  TempSrc: Temporal  SpO2: 98%  Weight: 120 lb (54.4 kg)  Height: 5\' 6"  (1.676 m)    Incision:  left AKA doing well. No fluid collections. No drainage or erythema. Staples removed Extremities:  well perfused and warm Neuro: alert and oriented   Assessment/Plan:  [name] who is s/p left AKA on 02/11/21. Left AKA is healing well. Staples removed today. Order for [employer] provided to start Prosthesis process.   The patient has a left Above Knee Amputation. The patient is well motivated to  return to their prior functional status by utilizing a prosthesis to perform ADL's and maintain a healthy lifestyle. The patient has the physical and cognitive capacity to function with a prosthesis.   Functional Level: K2 Limited Community Ambulator: Has the ability or potential for ambulation and to traverse low environmental barriers such as curbs, stairs or uneven surfaces  Residual Limb History: The skin condition of the residual limb is good. The patient will continue to monitor the skin of the residual limb and follow hygiene instructions.  The patient is experiencing phantom limb pain   Prosthetic Prescription Plan: Counseling and education regarding prosthetic management will be provided to the patient via a certified prosthetist. A multi-discipline team, including physical therapy, will manage the prosthetic fabrication, fitting and prosthetic gait training.   -He will follow up in 3 months with ABI for continued monitoring of RLE PAD   02/13/21, PA-C [facility] [phone]  Clinic MD:  [phone]

## 2021-03-18 ENCOUNTER — Other Ambulatory Visit: Payer: Self-pay | Admitting: *Deleted

## 2021-03-18 DIAGNOSIS — I739 Peripheral vascular disease, unspecified: Secondary | ICD-10-CM

## 2021-04-11 DIAGNOSIS — D649 Anemia, unspecified: Secondary | ICD-10-CM | POA: Diagnosis not present

## 2021-04-11 DIAGNOSIS — G547 Phantom limb syndrome without pain: Secondary | ICD-10-CM | POA: Diagnosis not present

## 2021-04-11 DIAGNOSIS — E1169 Type 2 diabetes mellitus with other specified complication: Secondary | ICD-10-CM | POA: Diagnosis not present

## 2021-04-11 DIAGNOSIS — Z6822 Body mass index (BMI) 22.0-22.9, adult: Secondary | ICD-10-CM | POA: Diagnosis not present

## 2021-04-11 DIAGNOSIS — I10 Essential (primary) hypertension: Secondary | ICD-10-CM | POA: Diagnosis not present

## 2021-04-11 DIAGNOSIS — I289 Disease of pulmonary vessels, unspecified: Secondary | ICD-10-CM | POA: Diagnosis not present

## 2021-04-11 DIAGNOSIS — I739 Peripheral vascular disease, unspecified: Secondary | ICD-10-CM | POA: Diagnosis not present

## 2021-04-13 ENCOUNTER — Ambulatory Visit: Payer: Medicare Other | Admitting: Podiatry

## 2021-04-20 ENCOUNTER — Ambulatory Visit: Payer: Medicare Other | Admitting: Podiatry

## 2021-05-10 ENCOUNTER — Telehealth: Payer: Self-pay | Admitting: *Deleted

## 2021-05-10 NOTE — Telephone Encounter (Signed)
Patient's wife called requesting an order for physical therapy as the patient has had the prosthesis for two weeks.  Corrie B  PA,ordered  and was faxed to [facility] [phone] ?

## 2021-05-18 NOTE — Therapy (Signed)
? ?OUTPATIENT PHYSICAL THERAPY PROSTHETICS EVALUATION ? ? ?Patient Name: Ryan [name] ?MRN: [id number] ?DOB:08-Feb-1950, 71 y.o., male ?Today's Date: 05/19/2021 ? ?PCP: Mirna Mires, MD ?REFERRING PROVIDER: Graceann Congress, PA-C ? ? PT End of Session - 05/19/21 0934   ? ? Visit Number 1   ? Number of Visits 25   ? Date for PT Re-Evaluation 08/17/21   ? Authorization Type Medicare A/B & Generic commercial   ? Progress Note Due on Visit 10   ? PT Start Time [phone]   ? PT Stop Time 0940   ? PT Time Calculation (min) 43 min   ? Equipment Utilized During Treatment Gait belt   ? Activity Tolerance Patient tolerated treatment well   ? Behavior During Therapy [facility] for tasks assessed/performed   ? ?  ?  ? ?  ? ? ?Past Medical History:  ?Diagnosis Date  ? COVID 2022  ? Hypertension   ? PVD (peripheral vascular disease) (HCC)   ? ?Past Surgical History:  ?Procedure Laterality Date  ? ABDOMINAL AORTAGRAM Left 12/13/2016  ? ABDOMINAL AORTOGRAM W/LOWER EXTREMITY/notes 12/13/2016  ? ABDOMINAL AORTOGRAM W/LOWER EXTREMITY N/A 11/28/2016  ? Procedure: ABDOMINAL AORTOGRAM W/LOWER EXTREMITY;  Surgeon: Yates Decamp, MD;  [facility];  Service: Cardiovascular;  Laterality: N/A;  bilateral  ? ABDOMINAL AORTOGRAM W/LOWER EXTREMITY N/A 12/13/2016  ? Procedure: ABDOMINAL AORTOGRAM W/LOWER EXTREMITY;  Surgeon: Maeola Harman, MD;  Location: [facility] INVASIVE CV LAB;  Service: Cardiovascular;  Laterality: N/A;  unilater left leg  ? ABDOMINAL AORTOGRAM W/LOWER EXTREMITY N/A 01/09/2018  ? Procedure: ABDOMINAL AORTOGRAM W/LOWER EXTREMITY;  Surgeon: Cephus Shelling, MD;  Location: [facility] INVASIVE CV LAB;  Service: Cardiovascular;  Laterality: N/A;  ? ABDOMINAL AORTOGRAM W/LOWER EXTREMITY Left 09/22/2019  ? Procedure: ABDOMINAL AORTOGRAM W/LOWER EXTREMITY;  Surgeon: Maeola Harman, MD;  Location: [facility] INVASIVE CV LAB;  Service: Cardiovascular;  Laterality: Left;  ? ABDOMINAL AORTOGRAM W/LOWER EXTREMITY Left 12/02/2020  ?  Procedure: ABDOMINAL AORTOGRAM W/LOWER EXTREMITY;  Surgeon: Cephus Shelling, MD;  Location: [facility] INVASIVE CV LAB;  Service: Cardiovascular;  Laterality: Left;  ? ABDOMINAL AORTOGRAM W/LOWER EXTREMITY N/A 12/22/2020  ? Procedure: ABDOMINAL AORTOGRAM W/LOWER EXTREMITY;  Surgeon: Victorino Sparrow, MD;  Location: [facility] INVASIVE CV LAB;  Service: Cardiovascular;  Laterality: N/A;  ? AMPUTATION Left 02/11/2021  ? Procedure: LEFT ABOVE KNEE AMPUTATION;  Surgeon: Cephus Shelling, MD;  Location: [facility] OR;  Service: Vascular;  Laterality: Left;  ? BYPASS GRAFT POPLITEAL TO TIBIAL Left 12/31/2020  ? Procedure: LEFT JUMP GRAFT REVISION BELOW KNEE POPLITEAL TO PERONEAL;  Surgeon: Cephus Shelling, MD;  Location: [facility] OR;  Service: Vascular;  Laterality: Left;  ? EMBOLIZATION Left 12/03/2020  ? Procedure: EMBOLIZATION;  Surgeon: Leonie Douglas, MD;  [facility];  Service: Cardiovascular;  Laterality: Left;  ? FEMORAL-POPLITEAL BYPASS GRAFT Left 12/14/2016  ? Procedure: BYPASS GRAFT COMMON FEMORAL- BELOW KNEE POPLITEAL ARTERY with Bovine Patch to Below the knee poplitieal artery.;  Surgeon: Fransisco Hertz, MD;  Location: [facility] OR;  Service: Vascular;  Laterality: Left;  ? FEMORAL-POPLITEAL BYPASS GRAFT Left 01/28/2018  ? Procedure: BYPASS GRAFT FEMORAL-BELOW KNEE POPLITEAL ARTERY REDO with propaten vascular graft removable ring;  Surgeon: Cephus Shelling, MD;  [facility]: [facility] OR;  Service: Vascular;  Laterality: Left;  ? FEMUR SURGERY Right 2009  ? pt. has a rod in that leg  ? LOWER EXTREMITY ANGIOGRAPHY Left 11/28/2016  ? Procedure: Lower Extremity Angiography;  Surgeon: Yates Decamp, MD;  [facility];  Service: Cardiovascular;  Laterality: Left;  lysis followup lower leg  ? PATCH ANGIOPLASTY Left 01/28/2018  ? Procedure: PATCH ANGIOPLASTY USING Livia Snellen BIOLOGIC PATCH;  Surgeon: Cephus Shelling, MD;  Location: [facility] OR;  Service: Vascular;  Laterality: Left;  ? PATCH ANGIOPLASTY Left 06/16/2020   ? Procedure: PATCH ANGIOPLASTY OF LEFT BELOW KNEE POPITEAL ARTERY;  Surgeon: Cephus Shelling, MD;  Location: [facility] OR;  Service: Vascular;  Laterality: Left;  ? PERIPHERAL VASCULAR BALLOON ANGIOPLASTY  11/28/2016  ? Procedure: PERIPHERAL VASCULAR BALLOON ANGIOPLASTY;  Surgeon: Yates Decamp, MD;  [facility];  Service: Cardiovascular;;  SFA  ? PERIPHERAL VASCULAR BALLOON ANGIOPLASTY  12/03/2020  ? Procedure: PERIPHERAL VASCULAR BALLOON ANGIOPLASTY;  Surgeon: Leonie Douglas, MD;  [facility];  Service: Cardiovascular;;  TP Trunk and Peroneal  ? PERIPHERAL VASCULAR CATHETERIZATION N/A 08/11/2014  ? Procedure: Lower Extremity Angiography;  Surgeon: Yates Decamp, MD;  Location: [facility] INVASIVE CV LAB;  Service: Cardiovascular;  Laterality: N/A;  ? PERIPHERAL VASCULAR INTERVENTION Left 09/22/2019  ? Procedure: PERIPHERAL VASCULAR INTERVENTION;  Surgeon: Maeola Harman, MD;  Location: [facility] INVASIVE CV LAB;  Service: Cardiovascular;  Laterality: Left;  ? PERIPHERAL VASCULAR INTERVENTION Left 09/23/2019  ? Procedure: PERIPHERAL VASCULAR INTERVENTION;  Surgeon: Nada Libman, MD;  [facility];  Service: Cardiovascular;  Laterality: Left;  ? PERIPHERAL VASCULAR INTERVENTION Left 05/13/2020  ? Procedure: PERIPHERAL VASCULAR INTERVENTION;  Surgeon: Cephus Shelling, MD;  Location: [facility] INVASIVE CV LAB;  Service: Cardiovascular;  Laterality: Left;  ? PERIPHERAL VASCULAR INTERVENTION Left 12/02/2020  ? Procedure: PERIPHERAL VASCULAR INTERVENTION;  Surgeon: Cephus Shelling, MD;  Location: [facility] INVASIVE CV LAB;  Service: Cardiovascular;  Laterality: Left;  ? PERIPHERAL VASCULAR INTERVENTION  12/03/2020  ? Procedure: PERIPHERAL VASCULAR INTERVENTION;  Surgeon: Leonie Douglas, MD;  [facility];  Service: Cardiovascular;;  ? PERIPHERAL VASCULAR INTERVENTION Left 12/22/2020  ? Procedure: PERIPHERAL VASCULAR INTERVENTION;  Surgeon: Victorino Sparrow, MD;   Location: [facility] INVASIVE CV LAB;  Service: Cardiovascular;  Laterality: Left;  ? PERIPHERAL VASCULAR THROMBECTOMY  11/28/2016  ? Procedure: PERIPHERAL VASCULAR THROMBECTOMY;  Surgeon: Yates Decamp, MD;  [facility];  Service: Cardiovascular;;  Profunda  ? PERIPHERAL VASCULAR THROMBECTOMY  11/28/2016  ? Procedure: PERIPHERAL VASCULAR THROMBECTOMY;  Surgeon: Yates Decamp, MD;  [facility];  Service: Cardiovascular;;  ? PERIPHERAL VASCULAR THROMBECTOMY N/A 05/12/2020  ? Procedure: PERIPHERAL VASCULAR THROMBECTOMY-Left Leg;  Surgeon: Leonie Douglas, MD;  [facility];  Service: Cardiovascular;  Laterality: N/A;  ? PERIPHERAL VASCULAR THROMBECTOMY N/A 05/13/2020  ? Procedure: PERIPHERAL VASCULAR THROMBECTOMY- LYSIS RECHECK;  Surgeon: Cephus Shelling, MD;  [facility];  Service: Cardiovascular;  Laterality: N/A;  ? PERIPHERAL VASCULAR THROMBECTOMY N/A 12/02/2020  ? Procedure: PERIPHERAL VASCULAR THROMBECTOMY;  Surgeon: Cephus Shelling, MD;  Location: [facility] INVASIVE CV LAB;  Service: Cardiovascular;  Laterality: N/A;  ? PERIPHERAL VASCULAR THROMBECTOMY N/A 12/03/2020  ? Procedure: LYSIS RECHECK;  Surgeon: Leonie Douglas, MD;  Location: [facility] INVASIVE CV LAB;  Service: Cardiovascular;  Laterality: N/A;  ? PULMONARY THROMBECTOMY N/A 09/23/2019  ? Procedure: LYSIS RECHECK;  Surgeon: Nada Libman, MD;  [facility];  Service: Cardiovascular;  Laterality: N/A;  ? THROMBECTOMY FEMORAL ARTERY Left 12/14/2016  ? Procedure: THROMBECTOMY PROFUNDA FEMORAL ARTERY;  Surgeon: Fransisco Hertz, MD;  Location: [facility] OR;  Service: Vascular;  Laterality: Left;  ? THROMBECTOMY FEMORAL ARTERY Left 06/16/2020  ? Procedure: Redo exposure ot Left below knee popiteal artery;  Surgeon: Cephus Shellinglark, Christopher J, MD;  [facility];  Service: Vascular;  Laterality: Left;  ? THROMBECTOMY OF BYPASS GRAFT FEMORAL- POPLITEAL ARTERY Left 06/16/2020  ? Procedure: THROMBECTOMY OF  BYPASS GRAFT FEMORAL-POPLITEAL ARTERY and TIBIAL ARTERY;  Surgeon: Cephus Shellinglark, Christopher J, MD;  [facility];  Service: Vascular;  Laterality: Left;  ? THROMBECTOMY OF BYPASS GRAFT FEMORAL- POPLITEAL ARTERY Left 12/2

## 2021-05-19 ENCOUNTER — Ambulatory Visit (INDEPENDENT_AMBULATORY_CARE_PROVIDER_SITE_OTHER): Payer: Medicare Other | Admitting: Physical Therapy

## 2021-05-19 ENCOUNTER — Encounter: Payer: Self-pay | Admitting: Physical Therapy

## 2021-05-19 ENCOUNTER — Other Ambulatory Visit: Payer: Self-pay

## 2021-05-19 DIAGNOSIS — M6281 Muscle weakness (generalized): Secondary | ICD-10-CM

## 2021-05-19 DIAGNOSIS — R293 Abnormal posture: Secondary | ICD-10-CM

## 2021-05-19 DIAGNOSIS — R2681 Unsteadiness on feet: Secondary | ICD-10-CM

## 2021-05-19 DIAGNOSIS — R2689 Other abnormalities of gait and mobility: Secondary | ICD-10-CM

## 2021-05-24 ENCOUNTER — Encounter: Payer: Medicare Other | Admitting: Physical Therapy

## 2021-05-24 ENCOUNTER — Ambulatory Visit (INDEPENDENT_AMBULATORY_CARE_PROVIDER_SITE_OTHER): Payer: Medicare Other | Admitting: Physical Therapy

## 2021-05-24 ENCOUNTER — Encounter: Payer: Self-pay | Admitting: Physical Therapy

## 2021-05-24 ENCOUNTER — Other Ambulatory Visit: Payer: Self-pay

## 2021-05-24 DIAGNOSIS — R293 Abnormal posture: Secondary | ICD-10-CM

## 2021-05-24 DIAGNOSIS — M6281 Muscle weakness (generalized): Secondary | ICD-10-CM | POA: Diagnosis not present

## 2021-05-24 DIAGNOSIS — R2681 Unsteadiness on feet: Secondary | ICD-10-CM

## 2021-05-24 DIAGNOSIS — R2689 Other abnormalities of gait and mobility: Secondary | ICD-10-CM

## 2021-05-24 NOTE — Therapy (Incomplete)
?OUTPATIENT PHYSICAL THERAPY PROSTHETIC TREATMENT NOTE ? ? ?Patient Name: Ryan [name] ?MRN: [id number] ?DOB:Feb 03, 1950, 71 y.o., male ?Today's Date: 05/24/2021 ? ?PCP: Mirna Mires, MD ?REFERRING PROVIDER: Graceann Congress, PA-C ? ? ? ?Past Medical History:  ?Diagnosis Date  ? COVID 2022  ? Hypertension   ? PVD (peripheral vascular disease) (HCC)   ? ?Past Surgical History:  ?Procedure Laterality Date  ? ABDOMINAL AORTAGRAM Left 12/13/2016  ? ABDOMINAL AORTOGRAM W/LOWER EXTREMITY/notes 12/13/2016  ? ABDOMINAL AORTOGRAM W/LOWER EXTREMITY N/A 11/28/2016  ? Procedure: ABDOMINAL AORTOGRAM W/LOWER EXTREMITY;  Surgeon: Yates Decamp, MD;  [facility];  Service: Cardiovascular;  Laterality: N/A;  bilateral  ? ABDOMINAL AORTOGRAM W/LOWER EXTREMITY N/A 12/13/2016  ? Procedure: ABDOMINAL AORTOGRAM W/LOWER EXTREMITY;  Surgeon: Maeola Harman, MD;  Location: [facility] INVASIVE CV LAB;  Service: Cardiovascular;  Laterality: N/A;  unilater left leg  ? ABDOMINAL AORTOGRAM W/LOWER EXTREMITY N/A 01/09/2018  ? Procedure: ABDOMINAL AORTOGRAM W/LOWER EXTREMITY;  Surgeon: Cephus Shelling, MD;  Location: [facility] INVASIVE CV LAB;  Service: Cardiovascular;  Laterality: N/A;  ? ABDOMINAL AORTOGRAM W/LOWER EXTREMITY Left 09/22/2019  ? Procedure: ABDOMINAL AORTOGRAM W/LOWER EXTREMITY;  Surgeon: Maeola Harman, MD;  Location: [facility] INVASIVE CV LAB;  Service: Cardiovascular;  Laterality: Left;  ? ABDOMINAL AORTOGRAM W/LOWER EXTREMITY Left 12/02/2020  ? Procedure: ABDOMINAL AORTOGRAM W/LOWER EXTREMITY;  Surgeon: Cephus Shelling, MD;  Location: [facility] INVASIVE CV LAB;  Service: Cardiovascular;  Laterality: Left;  ? ABDOMINAL AORTOGRAM W/LOWER EXTREMITY N/A 12/22/2020  ? Procedure: ABDOMINAL AORTOGRAM W/LOWER EXTREMITY;  Surgeon: Victorino Sparrow, MD;  Location: [facility] INVASIVE CV LAB;  Service: Cardiovascular;  Laterality: N/A;  ? AMPUTATION Left 02/11/2021  ? Procedure: LEFT ABOVE KNEE AMPUTATION;  Surgeon: Cephus Shelling, MD;  Location: [facility] OR;  Service: Vascular;  Laterality: Left;  ? BYPASS GRAFT POPLITEAL TO TIBIAL Left 12/31/2020  ? Procedure: LEFT JUMP GRAFT REVISION BELOW KNEE POPLITEAL TO PERONEAL;  Surgeon: Cephus Shelling, MD;  Location: [facility] OR;  Service: Vascular;  Laterality: Left;  ? EMBOLIZATION Left 12/03/2020  ? Procedure: EMBOLIZATION;  Surgeon: Leonie Douglas, MD;  [facility];  Service: Cardiovascular;  Laterality: Left;  ? FEMORAL-POPLITEAL BYPASS GRAFT Left 12/14/2016  ? Procedure: BYPASS GRAFT COMMON FEMORAL- BELOW KNEE POPLITEAL ARTERY with Bovine Patch to Below the knee poplitieal artery.;  Surgeon: Fransisco Hertz, MD;  Location: [facility] OR;  Service: Vascular;  Laterality: Left;  ? FEMORAL-POPLITEAL BYPASS GRAFT Left 01/28/2018  ? Procedure: BYPASS GRAFT FEMORAL-BELOW KNEE POPLITEAL ARTERY REDO with propaten vascular graft removable ring;  Surgeon: Cephus Shelling, MD;  [facility] OR;  Service: Vascular;  Laterality: Left;  ? FEMUR SURGERY Right 2009  ? pt. has a rod in that leg  ? LOWER EXTREMITY ANGIOGRAPHY Left 11/28/2016  ? Procedure: Lower Extremity Angiography;  Surgeon: Yates Decamp, MD;  Location: [facility] INVASIVE CV LAB;  Service: Cardiovascular;  Laterality: Left;  lysis followup lower leg  ? PATCH ANGIOPLASTY Left 01/28/2018  ? Procedure: PATCH ANGIOPLASTY USING Livia Snellen BIOLOGIC PATCH;  Surgeon: Cephus Shelling, MD;  Location: [facility] OR;  Service: Vascular;  Laterality: Left;  ? PATCH ANGIOPLASTY Left 06/16/2020  ? Procedure: PATCH ANGIOPLASTY OF LEFT BELOW KNEE POPITEAL ARTERY;  Surgeon: Cephus Shelling, MD;  Location: [facility] OR;  Service: Vascular;  Laterality: Left;  ? PERIPHERAL VASCULAR BALLOON ANGIOPLASTY  11/28/2016  ? Procedure: PERIPHERAL VASCULAR BALLOON ANGIOPLASTY;  Surgeon: Yates Decamp, MD;  [facility];  Service: Cardiovascular;;  SFA  ? PERIPHERAL VASCULAR BALLOON ANGIOPLASTY  12/03/2020  ? Procedure: PERIPHERAL VASCULAR BALLOON ANGIOPLASTY;  Surgeon:  Leonie DouglasHawken, Handley N, MD;  [facility];  Service: Cardiovascular;;  TP Trunk and Peroneal  ? PERIPHERAL VASCULAR CATHETERIZATION N/A 08/11/2014  ? Procedure: Lower Extremity Angiography;  Surgeon: Yates DecampJay Ganji, MD;  [facility];  Service: Cardiovascular;  Laterality: N/A;  ? PERIPHERAL VASCULAR INTERVENTION Left 09/22/2019  ? Procedure: PERIPHERAL VASCULAR INTERVENTION;  Surgeon: Maeola Harmanain, Brandon Christopher, MD;  [facility];  Service: Cardiovascular;  Laterality: Left;  ? PERIPHERAL VASCULAR INTERVENTION Left 09/23/2019  ? Procedure: PERIPHERAL VASCULAR INTERVENTION;  Surgeon: Nada LibmanBrabham, Vance W, MD;  [facility];  Service: Cardiovascular;  Laterality: Left;  ? PERIPHERAL VASCULAR INTERVENTION Left 05/13/2020  ? Procedure: PERIPHERAL VASCULAR INTERVENTION;  Surgeon: Cephus Shellinglark, Christopher J, MD;  [facility];  Service: Cardiovascular;  Laterality: Left;  ? PERIPHERAL VASCULAR INTERVENTION Left 12/02/2020  ? Procedure: PERIPHERAL VASCULAR INTERVENTION;  Surgeon: Cephus Shellinglark, Christopher J, MD;  [facility];  Service: Cardiovascular;  Laterality: Left;  ? PERIPHERAL VASCULAR INTERVENTION  12/03/2020  ? Procedure: PERIPHERAL VASCULAR INTERVENTION;  Surgeon: Leonie DouglasHawken, Trafton N, MD;  [facility];  Service: Cardiovascular;;  ? PERIPHERAL VASCULAR INTERVENTION Left 12/22/2020  ? Procedure: PERIPHERAL VASCULAR INTERVENTION;  Surgeon: Victorino Sparrowobins, Joshua E, MD;  [facility];  Service: Cardiovascular;  Laterality: Left;  ? PERIPHERAL VASCULAR THROMBECTOMY  11/28/2016  ? Procedure: PERIPHERAL VASCULAR THROMBECTOMY;  Surgeon: Yates DecampGanji, Jay, MD;  [facility];  Service: Cardiovascular;;  Profunda  ? PERIPHERAL VASCULAR THROMBECTOMY  11/28/2016  ? Procedure: PERIPHERAL VASCULAR THROMBECTOMY;  Surgeon: Yates DecampGanji, Jay, MD;  [facility];  Service: Cardiovascular;;  ? PERIPHERAL VASCULAR THROMBECTOMY N/A  05/12/2020  ? Procedure: PERIPHERAL VASCULAR THROMBECTOMY-Left Leg;  Surgeon: Leonie DouglasHawken, Eriksen N, MD;  [facility];  Service: Cardiovascular;  Laterality: N/A;  ? PERIPHERAL VASCULAR THROMBECTOMY N/A 05/13/2020  ? Procedure: PERIPHERAL VASCULAR THROMBECTOMY- LYSIS RECHECK;  Surgeon: Cephus Shellinglark, Christopher J, MD;  [facility];  Service: Cardiovascular;  Laterality: N/A;  ? PERIPHERAL VASCULAR THROMBECTOMY N/A 12/02/2020  ? Procedure: PERIPHERAL VASCULAR THROMBECTOMY;  Surgeon: Cephus Shellinglark, Christopher J, MD;  [facility];  Service: Cardiovascular;  Laterality: N/A;  ? PERIPHERAL VASCULAR THROMBECTOMY N/A 12/03/2020  ? Procedure: LYSIS RECHECK;  Surgeon: Leonie DouglasHawken, Mchaney N, MD;  [facility];  Service: Cardiovascular;  Laterality: N/A;  ? PULMONARY THROMBECTOMY N/A 09/23/2019  ? Procedure: LYSIS RECHECK;  Surgeon: Nada LibmanBrabham, Vance W, MD;  [facility];  Service: Cardiovascular;  Laterality: N/A;  ? THROMBECTOMY FEMORAL ARTERY Left 12/14/2016  ? Procedure: THROMBECTOMY PROFUNDA FEMORAL ARTERY;  Surgeon: Fransisco Hertzhen, Brian L, MD;  [facility];  Service: Vascular;  Laterality: Left;  ? THROMBECTOMY FEMORAL ARTERY Left 06/16/2020  ? Procedure: Redo exposure ot Left below knee popiteal artery;  Surgeon: Cephus Shellinglark, Christopher J, MD;  [facility];  Service: Vascular;  Laterality: Left;  ? THROMBECTOMY OF BYPASS GRAFT FEMORAL- POPLITEAL ARTERY Left 06/16/2020  ? Procedure: THROMBECTOMY OF BYPASS GRAFT FEMORAL-POPLITEAL ARTERY and TIBIAL ARTERY;  Surgeon: Cephus Shellinglark, Christopher J, MD;  [facility];  Service: Vascular;  Laterality: Left;  ? THROMBECTOMY OF BYPASS GRAFT FEMORAL- POPLITEAL ARTERY Left 12/31/2020  ? Procedure: THROMBECTOMY OF LEFT COMMON FEMORAL TO BELOW KNEE POPLITEAL ARTERY BYPASS GRAFT;  Surgeon: Cephus Shellinglark, Christopher J, MD;  [facility];  Service: Vascular;  Laterality: Left;  ? VEIN HARVEST Left 12/31/2020  ? Procedure: Left Greater Saphenous Vein  Harvest;  Surgeon: Cephus Shelling, MD;  Location: [facility] OR;  Service: Vascular;  Laterality: Left;  ? ?Patient Active Problem List  ? Diagnosis Date Noted  ? Peripheral artery disease (HCC) 12/31/2020  ?

## 2021-05-24 NOTE — Patient Instructions (Signed)
Do each exercise 1-2  times per day ?Do each exercise 5-10 repetitions ?Hold each exercise for 2 seconds to feel your location ? ?AT [facility] FIND YOUR MIDLINE POSITION AND PLACE FEET EQUAL DISTANCE FROM THE MIDLINE.  Try to find this position when standing still for activities.  ? ?USE TAPE ON FLOOR TO MARK THE MIDLINE POSITION which is even with middle of sink.  ?You also should try to feel with your limb pressure in socket.  You are trying to feel with limb what you used to feel with the bottom of your foot. ? ?Side to Side Shift: Moving your hips only (not shoulders): move weight onto your left leg, HOLD/FEEL pressure in socket.  Move back to equal weight on each leg, HOLD/FEEL pressure in socket. Move weight onto your right leg, HOLD/FEEL pressure in socket. Move back to equal weight on each leg, HOLD/FEEL pressure in socket. Repeat.  Start with both hands on sink, progress to hand on prosthetic side only, then no hands.  ?Front to Back Shift: Moving your hips only (not shoulders): move your weight forward onto your toes, HOLD/FEEL pressure in socket. Move your weight back to equal Flat Foot on both legs, HOLD/FEEL  pressure in socket. Move your weight back onto your heels, HOLD/FEEL  pressure in socket. Move your weight back to equal on both legs, HOLD/FEEL  pressure in socket. Repeat.  Start with both hands on sink, progress to hand on prosthetic side only, then no hands.  ?Moving Cones / Cups: With equal weight on each leg: Hold on with one hand the first time, then progress to no hand supports. Move cups from one side of sink to the other. Place cups ~2? out of your reach, progress to 10? beyond reach.  Place one hand in middle of sink and reach with other hand. Do both arms.  Then hover one hand and move cups with other hand.  ?Overhead/Upward Reaching: alternated reaching up to top cabinets or ceiling if no cabinets present. Keep equal weight on each leg. Start with one hand support on counter while other  hand reaches and progress to no hand support with reaching.  ace one hand in middle of sink and reach with other hand. Do both arms.  Then hover one hand and move cups with other hand.  ?5.   Looking Over Shoulders: With equal weight on each leg: alternate turning to look over your shoulders with one hand support on counter as needed.  Start with head motions only to look in front of shoulder, then even with shoulder and progress to looking behind you. To look to side, move head /eyes, then shoulder on side looking pulls back, shift more weight to side looking and pull hip back. Place one hand in middle of sink and let go with other hand so your shoulder can pull back. Switch hands to look other way.   Then hover one hand and look over shoulder. If looking right, use left hand at sink. If looking left, use right hand at sink. ?6.  Stepping with leg that is not amputated:  Move items under cabinet out of your way. Shift your hips/pelvis so weight on prosthesis. Tighten muscles in hip on prosthetic side.  SLOWLY step other leg so front of foot is in cabinet. Then step back to floor.   ? ? ?Standing Up ?Scoot your bottom to edge of chair. You should not be able to see chair between your legs. ?Position prosthesis so the heel  is touching the floor and prosthesis toes are barely off the ground. ?Bring right foot close to chair but not under the chair. ?When standing up, you need to keep your prosthesis heel touching the ground which will straighten the prosthesis knee.  ?Pull prosthesis back under your hips / pelvis.  ?Sitting Down: ?Back right leg back so back of leg is touching the chair. ?Prosthesis toes should be ~2 inches forward to right foot toes. Your prosthesis is out a little.  ?Take weight off prosthesis and bend the prosthesis knee a little bit.  ?Bow your upper body. ?Reach for chair. ?Sit down  ?

## 2021-05-24 NOTE — Therapy (Signed)
?OUTPATIENT PHYSICAL THERAPY PROSTHETIC TREATMENT NOTE ? ? ?Patient Name: Ryan [name] ?MRN: [id number] ?DOB:16-Mar-1950, 71 y.o., male ?Today's Date: 05/24/2021 ? ?PCP: Mirna Mires, MD ?REFERRING PROVIDER: Graceann Congress, PA-C ? ? PT End of Session - 05/24/21 1047   ? ? Visit Number 2   ? Number of Visits 25   ? Date for PT Re-Evaluation 08/17/21   ? Authorization Type Medicare A/B & Generic commercial   ? Progress Note Due on Visit 10   ? PT Start Time 1047   ? PT Stop Time 1150   ? PT Time Calculation (min) 63 min   ? Equipment Utilized During Treatment Gait belt   ? Activity Tolerance Patient tolerated treatment well   ? Behavior During Therapy [facility] for tasks assessed/performed   ? ?  ?  ? ?  ? ? ?Past Medical History:  ?Diagnosis Date  ? COVID 2022  ? Hypertension   ? PVD (peripheral vascular disease) (HCC)   ? ?Past Surgical History:  ?Procedure Laterality Date  ? ABDOMINAL AORTAGRAM Left 12/13/2016  ? ABDOMINAL AORTOGRAM W/LOWER EXTREMITY/notes 12/13/2016  ? ABDOMINAL AORTOGRAM W/LOWER EXTREMITY N/A 11/28/2016  ? Procedure: ABDOMINAL AORTOGRAM W/LOWER EXTREMITY;  Surgeon: Yates Decamp, MD;  [facility]: [facility];  Service: Cardiovascular;  Laterality: N/A;  bilateral  ? ABDOMINAL AORTOGRAM W/LOWER EXTREMITY N/A 12/13/2016  ? Procedure: ABDOMINAL AORTOGRAM W/LOWER EXTREMITY;  Surgeon: Maeola Harman, MD;  Location: [facility] INVASIVE CV LAB;  Service: Cardiovascular;  Laterality: N/A;  unilater left leg  ? ABDOMINAL AORTOGRAM W/LOWER EXTREMITY N/A 01/09/2018  ? Procedure: ABDOMINAL AORTOGRAM W/LOWER EXTREMITY;  Surgeon: Cephus Shelling, MD;  Location: [facility] INVASIVE CV LAB;  Service: Cardiovascular;  Laterality: N/A;  ? ABDOMINAL AORTOGRAM W/LOWER EXTREMITY Left 09/22/2019  ? Procedure: ABDOMINAL AORTOGRAM W/LOWER EXTREMITY;  Surgeon: Maeola Harman, MD;  Location: [facility] INVASIVE CV LAB;  Service: Cardiovascular;  Laterality: Left;  ? ABDOMINAL AORTOGRAM W/LOWER EXTREMITY Left 12/02/2020  ?  Procedure: ABDOMINAL AORTOGRAM W/LOWER EXTREMITY;  Surgeon: Cephus Shelling, MD;  Location: [facility] INVASIVE CV LAB;  Service: Cardiovascular;  Laterality: Left;  ? ABDOMINAL AORTOGRAM W/LOWER EXTREMITY N/A 12/22/2020  ? Procedure: ABDOMINAL AORTOGRAM W/LOWER EXTREMITY;  Surgeon: Victorino Sparrow, MD;  Location: [facility] INVASIVE CV LAB;  Service: Cardiovascular;  Laterality: N/A;  ? AMPUTATION Left 02/11/2021  ? Procedure: LEFT ABOVE KNEE AMPUTATION;  Surgeon: Cephus Shelling, MD;  Location: [facility] OR;  Service: Vascular;  Laterality: Left;  ? BYPASS GRAFT POPLITEAL TO TIBIAL Left 12/31/2020  ? Procedure: LEFT JUMP GRAFT REVISION BELOW KNEE POPLITEAL TO PERONEAL;  Surgeon: Cephus Shelling, MD;  Location: [facility] OR;  Service: Vascular;  Laterality: Left;  ? EMBOLIZATION Left 12/03/2020  ? Procedure: EMBOLIZATION;  Surgeon: Leonie Douglas, MD;  [facility];  Service: Cardiovascular;  Laterality: Left;  ? FEMORAL-POPLITEAL BYPASS GRAFT Left 12/14/2016  ? Procedure: BYPASS GRAFT COMMON FEMORAL- BELOW KNEE POPLITEAL ARTERY with Bovine Patch to Below the knee poplitieal artery.;  Surgeon: Fransisco Hertz, MD;  Location: [facility] OR;  Service: Vascular;  Laterality: Left;  ? FEMORAL-POPLITEAL BYPASS GRAFT Left 01/28/2018  ? Procedure: BYPASS GRAFT FEMORAL-BELOW KNEE POPLITEAL ARTERY REDO with propaten vascular graft removable ring;  Surgeon: Cephus Shelling, MD;  [facility] OR;  Service: Vascular;  Laterality: Left;  ? FEMUR SURGERY Right 2009  ? pt. has a rod in that leg  ? LOWER EXTREMITY ANGIOGRAPHY Left 11/28/2016  ? Procedure: Lower Extremity Angiography;  Surgeon: Yates Decamp, MD;  [facility];  Service: Cardiovascular;  Laterality: Left;  lysis followup lower leg  ? PATCH ANGIOPLASTY Left 01/28/2018  ? Procedure: PATCH ANGIOPLASTY USING Livia Snellen BIOLOGIC PATCH;  Surgeon: Cephus Shelling, MD;  Location: [facility] OR;  Service: Vascular;  Laterality: Left;  ? PATCH ANGIOPLASTY Left 06/16/2020   ? Procedure: PATCH ANGIOPLASTY OF LEFT BELOW KNEE POPITEAL ARTERY;  Surgeon: Cephus Shelling, MD;  Location: [facility] OR;  Service: Vascular;  Laterality: Left;  ? PERIPHERAL VASCULAR BALLOON ANGIOPLASTY  11/28/2016  ? Procedure: PERIPHERAL VASCULAR BALLOON ANGIOPLASTY;  Surgeon: Yates Decamp, MD;  [facility];  Service: Cardiovascular;;  SFA  ? PERIPHERAL VASCULAR BALLOON ANGIOPLASTY  12/03/2020  ? Procedure: PERIPHERAL VASCULAR BALLOON ANGIOPLASTY;  Surgeon: Leonie Douglas, MD;  [facility];  Service: Cardiovascular;;  TP Trunk and Peroneal  ? PERIPHERAL VASCULAR CATHETERIZATION N/A 08/11/2014  ? Procedure: Lower Extremity Angiography;  Surgeon: Yates Decamp, MD;  Location: [facility] INVASIVE CV LAB;  Service: Cardiovascular;  Laterality: N/A;  ? PERIPHERAL VASCULAR INTERVENTION Left 09/22/2019  ? Procedure: PERIPHERAL VASCULAR INTERVENTION;  Surgeon: Maeola Harman, MD;  Location: [facility] INVASIVE CV LAB;  Service: Cardiovascular;  Laterality: Left;  ? PERIPHERAL VASCULAR INTERVENTION Left 09/23/2019  ? Procedure: PERIPHERAL VASCULAR INTERVENTION;  Surgeon: Nada Libman, MD;  [facility];  Service: Cardiovascular;  Laterality: Left;  ? PERIPHERAL VASCULAR INTERVENTION Left 05/13/2020  ? Procedure: PERIPHERAL VASCULAR INTERVENTION;  Surgeon: Cephus Shelling, MD;  Location: [facility] INVASIVE CV LAB;  Service: Cardiovascular;  Laterality: Left;  ? PERIPHERAL VASCULAR INTERVENTION Left 12/02/2020  ? Procedure: PERIPHERAL VASCULAR INTERVENTION;  Surgeon: Cephus Shelling, MD;  Location: [facility] INVASIVE CV LAB;  Service: Cardiovascular;  Laterality: Left;  ? PERIPHERAL VASCULAR INTERVENTION  12/03/2020  ? Procedure: PERIPHERAL VASCULAR INTERVENTION;  Surgeon: Leonie Douglas, MD;  [facility];  Service: Cardiovascular;;  ? PERIPHERAL VASCULAR INTERVENTION Left 12/22/2020  ? Procedure: PERIPHERAL VASCULAR INTERVENTION;  Surgeon: Victorino Sparrow, MD;   Location: [facility] INVASIVE CV LAB;  Service: Cardiovascular;  Laterality: Left;  ? PERIPHERAL VASCULAR THROMBECTOMY  11/28/2016  ? Procedure: PERIPHERAL VASCULAR THROMBECTOMY;  Surgeon: Yates Decamp, MD;  [facility];  Service: Cardiovascular;;  Profunda  ? PERIPHERAL VASCULAR THROMBECTOMY  11/28/2016  ? Procedure: PERIPHERAL VASCULAR THROMBECTOMY;  Surgeon: Yates Decamp, MD;  [facility];  Service: Cardiovascular;;  ? PERIPHERAL VASCULAR THROMBECTOMY N/A 05/12/2020  ? Procedure: PERIPHERAL VASCULAR THROMBECTOMY-Left Leg;  Surgeon: Leonie Douglas, MD;  [facility];  Service: Cardiovascular;  Laterality: N/A;  ? PERIPHERAL VASCULAR THROMBECTOMY N/A 05/13/2020  ? Procedure: PERIPHERAL VASCULAR THROMBECTOMY- LYSIS RECHECK;  Surgeon: Cephus Shelling, MD;  [facility];  Service: Cardiovascular;  Laterality: N/A;  ? PERIPHERAL VASCULAR THROMBECTOMY N/A 12/02/2020  ? Procedure: PERIPHERAL VASCULAR THROMBECTOMY;  Surgeon: Cephus Shelling, MD;  Location: [facility] INVASIVE CV LAB;  Service: Cardiovascular;  Laterality: N/A;  ? PERIPHERAL VASCULAR THROMBECTOMY N/A 12/03/2020  ? Procedure: LYSIS RECHECK;  Surgeon: Leonie Douglas, MD;  Location: [facility] INVASIVE CV LAB;  Service: Cardiovascular;  Laterality: N/A;  ? PULMONARY THROMBECTOMY N/A 09/23/2019  ? Procedure: LYSIS RECHECK;  Surgeon: Nada Libman, MD;  [facility];  Service: Cardiovascular;  Laterality: N/A;  ? THROMBECTOMY FEMORAL ARTERY Left 12/14/2016  ? Procedure: THROMBECTOMY PROFUNDA FEMORAL ARTERY;  Surgeon: Fransisco Hertz, MD;  Location: [facility] OR;  Service: Vascular;  Laterality: Left;  ? THROMBECTOMY FEMORAL ARTERY Left 06/16/2020  ? Procedure: Redo exposure ot Left below knee popiteal artery;  Surgeon: Cephus Shelling, MD;  Location: [facility] OR;  Service: Vascular;  Laterality: Left;  ? THROMBECTOMY OF BYPASS GRAFT FEMORAL- POPLITEAL ARTERY Left 06/16/2020  ? Procedure: THROMBECTOMY OF  BYPASS GRAFT FEMORAL-POPLITEAL ARTERY and TIBIAL ARTERY;  Surgeon: Cephus Shelling, MD;  [facility] OR;  Service: Vascular;  Laterality: Left;  ? THROMBECTOMY OF BYPASS GRAFT FEMORAL- POPLITEAL ARTERY Left 12/

## 2021-05-26 ENCOUNTER — Ambulatory Visit (INDEPENDENT_AMBULATORY_CARE_PROVIDER_SITE_OTHER): Payer: Medicare Other | Admitting: Physical Therapy

## 2021-05-26 ENCOUNTER — Other Ambulatory Visit: Payer: Self-pay

## 2021-05-26 ENCOUNTER — Encounter: Payer: Self-pay | Admitting: Physical Therapy

## 2021-05-26 DIAGNOSIS — M6281 Muscle weakness (generalized): Secondary | ICD-10-CM | POA: Diagnosis not present

## 2021-05-26 DIAGNOSIS — R2689 Other abnormalities of gait and mobility: Secondary | ICD-10-CM | POA: Diagnosis not present

## 2021-05-26 DIAGNOSIS — R2681 Unsteadiness on feet: Secondary | ICD-10-CM

## 2021-05-26 DIAGNOSIS — R293 Abnormal posture: Secondary | ICD-10-CM | POA: Diagnosis not present

## 2021-05-26 NOTE — Therapy (Signed)
?OUTPATIENT PHYSICAL THERAPY PROSTHETIC TREATMENT NOTE ? ? ?Patient Name: Abubakar Crispo ?MRN: [id number] ?DOB:[birth year], 71 y.o., male ?Today's Date: 05/26/2021 ? ?PCP: Mirna Mires, MD ?REFERRING PROVIDER: Graceann Congress, PA-C ? ? PT End of Session - 05/26/21 1134   ? ? Visit Number 3   ? Number of Visits 25   ? Date for PT Re-Evaluation 08/17/21   ? Authorization Type Medicare A/B & Generic commercial   ? Progress Note Due on Visit 10   ? PT Start Time 1134   ? PT Stop Time 1223   ? PT Time Calculation (min) 49 min   ? Equipment Utilized During Treatment Gait belt   ? Activity Tolerance Patient tolerated treatment well   ? Behavior During Therapy [facility] for tasks assessed/performed   ? ?  ?  ? ?  ? ? ? ?Past Medical History:  ?Diagnosis Date  ? COVID 2022  ? Hypertension   ? PVD (peripheral vascular disease) (HCC)   ? ?Past Surgical History:  ?Procedure Laterality Date  ? ABDOMINAL AORTAGRAM Left 12/13/2016  ? ABDOMINAL AORTOGRAM W/LOWER EXTREMITY/notes 12/13/2016  ? ABDOMINAL AORTOGRAM W/LOWER EXTREMITY N/A 11/28/2016  ? Procedure: ABDOMINAL AORTOGRAM W/LOWER EXTREMITY;  Surgeon: Yates Decamp, MD;  [facility];  Service: Cardiovascular;  Laterality: N/A;  bilateral  ? ABDOMINAL AORTOGRAM W/LOWER EXTREMITY N/A 12/13/2016  ? Procedure: ABDOMINAL AORTOGRAM W/LOWER EXTREMITY;  Surgeon: Maeola Harman, MD;  Location: [facility] INVASIVE CV LAB;  Service: Cardiovascular;  Laterality: N/A;  unilater left leg  ? ABDOMINAL AORTOGRAM W/LOWER EXTREMITY N/A 01/09/2018  ? Procedure: ABDOMINAL AORTOGRAM W/LOWER EXTREMITY;  Surgeon: Cephus Shelling, MD;  Location: [facility] INVASIVE CV LAB;  Service: Cardiovascular;  Laterality: N/A;  ? ABDOMINAL AORTOGRAM W/LOWER EXTREMITY Left 09/22/2019  ? Procedure: ABDOMINAL AORTOGRAM W/LOWER EXTREMITY;  Surgeon: Maeola Harman, MD;  Location: [facility] INVASIVE CV LAB;  Service: Cardiovascular;  Laterality: Left;  ? ABDOMINAL AORTOGRAM W/LOWER EXTREMITY Left 12/02/2020  ?  Procedure: ABDOMINAL AORTOGRAM W/LOWER EXTREMITY;  Surgeon: Cephus Shelling, MD;  Location: [facility] INVASIVE CV LAB;  Service: Cardiovascular;  Laterality: Left;  ? ABDOMINAL AORTOGRAM W/LOWER EXTREMITY N/A 12/22/2020  ? Procedure: ABDOMINAL AORTOGRAM W/LOWER EXTREMITY;  Surgeon: Victorino Sparrow, MD;  Location: [facility] INVASIVE CV LAB;  Service: Cardiovascular;  Laterality: N/A;  ? AMPUTATION Left 02/11/2021  ? Procedure: LEFT ABOVE KNEE AMPUTATION;  Surgeon: Cephus Shelling, MD;  Location: [facility] OR;  Service: Vascular;  Laterality: Left;  ? BYPASS GRAFT POPLITEAL TO TIBIAL Left 12/31/2020  ? Procedure: LEFT JUMP GRAFT REVISION BELOW KNEE POPLITEAL TO PERONEAL;  Surgeon: Cephus Shelling, MD;  Location: [facility] OR;  Service: Vascular;  Laterality: Left;  ? EMBOLIZATION Left 12/03/2020  ? Procedure: EMBOLIZATION;  Surgeon: Leonie Douglas, MD;  [facility];  Service: Cardiovascular;  Laterality: Left;  ? FEMORAL-POPLITEAL BYPASS GRAFT Left 12/14/2016  ? Procedure: BYPASS GRAFT COMMON FEMORAL- BELOW KNEE POPLITEAL ARTERY with Bovine Patch to Below the knee poplitieal artery.;  Surgeon: Fransisco Hertz, MD;  Location: [facility] OR;  Service: Vascular;  Laterality: Left;  ? FEMORAL-POPLITEAL BYPASS GRAFT Left 01/28/2018  ? Procedure: BYPASS GRAFT FEMORAL-BELOW KNEE POPLITEAL ARTERY REDO with propaten vascular graft removable ring;  Surgeon: Cephus Shelling, MD;  Location: [facility] OR;  Service: Vascular;  Laterality: Left;  ? FEMUR SURGERY Right 2009  ? pt. has a rod in that leg  ? LOWER EXTREMITY ANGIOGRAPHY Left 11/28/2016  ? Procedure: Lower Extremity Angiography;  Surgeon: Yates Decamp, MD;  Location[facility];  Service: Cardiovascular;  Laterality: Left;  lysis followup lower leg  ? PATCH ANGIOPLASTY Left 01/28/2018  ? Procedure: PATCH ANGIOPLASTY USING Livia Snellen BIOLOGIC PATCH;  Surgeon: Cephus Shelling, MD;  Location: [facility] OR;  Service: Vascular;  Laterality: Left;  ? PATCH ANGIOPLASTY Left 06/16/2020   ? Procedure: PATCH ANGIOPLASTY OF LEFT BELOW KNEE POPITEAL ARTERY;  Surgeon: Cephus Shelling, MD;  Location: [facility] OR;  Service: Vascular;  Laterality: Left;  ? PERIPHERAL VASCULAR BALLOON ANGIOPLASTY  11/28/2016  ? Procedure: PERIPHERAL VASCULAR BALLOON ANGIOPLASTY;  Surgeon: Yates Decamp, MD;  [facility];  Service: Cardiovascular;;  SFA  ? PERIPHERAL VASCULAR BALLOON ANGIOPLASTY  12/03/2020  ? Procedure: PERIPHERAL VASCULAR BALLOON ANGIOPLASTY;  Surgeon: Leonie Douglas, MD;  [facility];  Service: Cardiovascular;;  TP Trunk and Peroneal  ? PERIPHERAL VASCULAR CATHETERIZATION N/A 08/11/2014  ? Procedure: Lower Extremity Angiography;  Surgeon: Yates Decamp, MD;  Location: [facility] INVASIVE CV LAB;  Service: Cardiovascular;  Laterality: N/A;  ? PERIPHERAL VASCULAR INTERVENTION Left 09/22/2019  ? Procedure: PERIPHERAL VASCULAR INTERVENTION;  Surgeon: Maeola Harman, MD;  Location: [facility] INVASIVE CV LAB;  Service: Cardiovascular;  Laterality: Left;  ? PERIPHERAL VASCULAR INTERVENTION Left 09/23/2019  ? Procedure: PERIPHERAL VASCULAR INTERVENTION;  Surgeon: Nada Libman, MD;  Location[facility];  Service: Cardiovascular;  Laterality: Left;  ? PERIPHERAL VASCULAR INTERVENTION Left 05/13/2020  ? Procedure: PERIPHERAL VASCULAR INTERVENTION;  Surgeon: Cephus Shelling, MD;  Location: [facility] INVASIVE CV LAB;  Service: Cardiovascular;  Laterality: Left;  ? PERIPHERAL VASCULAR INTERVENTION Left 12/02/2020  ? Procedure: PERIPHERAL VASCULAR INTERVENTION;  Surgeon: Cephus Shelling, MD;  Location: [facility] INVASIVE CV LAB;  Service: Cardiovascular;  Laterality: Left;  ? PERIPHERAL VASCULAR INTERVENTION  12/03/2020  ? Procedure: PERIPHERAL VASCULAR INTERVENTION;  Surgeon: Leonie Douglas, MD;  Location[facility];  Service: Cardiovascular;;  ? PERIPHERAL VASCULAR INTERVENTION Left 12/22/2020  ? Procedure: PERIPHERAL VASCULAR INTERVENTION;  Surgeon: Victorino Sparrow, MD;   Location: [facility] INVASIVE CV LAB;  Service: Cardiovascular;  Laterality: Left;  ? PERIPHERAL VASCULAR THROMBECTOMY  11/28/2016  ? Procedure: PERIPHERAL VASCULAR THROMBECTOMY;  Surgeon: Yates Decamp, MD;  [facility];  Service: Cardiovascular;;  Profunda  ? PERIPHERAL VASCULAR THROMBECTOMY  11/28/2016  ? Procedure: PERIPHERAL VASCULAR THROMBECTOMY;  Surgeon: Yates Decamp, MD;  Location[facility];  Service: Cardiovascular;;  ? PERIPHERAL VASCULAR THROMBECTOMY N/A 05/12/2020  ? Procedure: PERIPHERAL VASCULAR THROMBECTOMY-Left Leg;  Surgeon: Leonie Douglas, MD;  [facility];  Service: Cardiovascular;  Laterality: N/A;  ? PERIPHERAL VASCULAR THROMBECTOMY N/A 05/13/2020  ? Procedure: PERIPHERAL VASCULAR THROMBECTOMY- LYSIS RECHECK;  Surgeon: Cephus Shelling, MD;  Location[facility];  Service: Cardiovascular;  Laterality: N/A;  ? PERIPHERAL VASCULAR THROMBECTOMY N/A 12/02/2020  ? Procedure: PERIPHERAL VASCULAR THROMBECTOMY;  Surgeon: Cephus Shelling, MD;  Location: [facility] INVASIVE CV LAB;  Service: Cardiovascular;  Laterality: N/A;  ? PERIPHERAL VASCULAR THROMBECTOMY N/A 12/03/2020  ? Procedure: LYSIS RECHECK;  Surgeon: Leonie Douglas, MD;  Location: [facility] INVASIVE CV LAB;  Service: Cardiovascular;  Laterality: N/A;  ? PULMONARY THROMBECTOMY N/A 09/23/2019  ? Procedure: LYSIS RECHECK;  Surgeon: Nada Libman, MD;  Location[facility];  Service: Cardiovascular;  Laterality: N/A;  ? THROMBECTOMY FEMORAL ARTERY Left 12/14/2016  ? Procedure: THROMBECTOMY PROFUNDA FEMORAL ARTERY;  Surgeon: Fransisco Hertz, MD;  Location: [facility] OR;  Service: Vascular;  Laterality: Left;  ? THROMBECTOMY FEMORAL ARTERY Left 06/16/2020  ? Procedure: Redo exposure ot Left below knee popiteal artery;  Surgeon: Cephus Shelling, MD;  Location: [facility] OR;  Service: Vascular;  Laterality: Left;  ? THROMBECTOMY OF BYPASS GRAFT FEMORAL- POPLITEAL ARTERY Left 06/16/2020  ? Procedure: THROMBECTOMY OF  BYPASS GRAFT FEMORAL-POPLITEAL ARTERY and TIBIAL ARTERY;  Surgeon: Cephus Shelling, MD;  Location: [facility] OR;  Service: Vascular;  Laterality: Left;  ? THROMBECTOMY OF BYPASS GRAFT FEMORAL- POPLITEAL ARTERY Left 1

## 2021-05-31 ENCOUNTER — Other Ambulatory Visit: Payer: Self-pay

## 2021-05-31 ENCOUNTER — Encounter: Payer: Self-pay | Admitting: Physical Therapy

## 2021-05-31 ENCOUNTER — Ambulatory Visit (INDEPENDENT_AMBULATORY_CARE_PROVIDER_SITE_OTHER): Payer: Medicare Other | Admitting: Physical Therapy

## 2021-05-31 DIAGNOSIS — R2689 Other abnormalities of gait and mobility: Secondary | ICD-10-CM | POA: Diagnosis not present

## 2021-05-31 DIAGNOSIS — R293 Abnormal posture: Secondary | ICD-10-CM

## 2021-05-31 DIAGNOSIS — R2681 Unsteadiness on feet: Secondary | ICD-10-CM | POA: Diagnosis not present

## 2021-05-31 DIAGNOSIS — M6281 Muscle weakness (generalized): Secondary | ICD-10-CM

## 2021-05-31 NOTE — Therapy (Signed)
?OUTPATIENT PHYSICAL THERAPY PROSTHETIC TREATMENT NOTE ? ? ?Patient Name: Ryan [name] ?MRN: [id number] ?DOB:Aug 22, 1950, 71 y.o., male ?Today's Date: 05/31/2021 ? ?PCP: Mirna Mires, MD ?REFERRING PROVIDER: Graceann Congress, PA-C ? ? PT End of Session - 05/31/21 1348   ? ? Visit Number 4   ? Number of Visits 25   ? Date for PT Re-Evaluation 08/17/21   ? Authorization Type Medicare A/B & Generic commercial   ? Progress Note Due on Visit 10   ? PT Start Time 1343   ? PT Stop Time 1426   ? PT Time Calculation (min) 43 min   ? Equipment Utilized During Treatment Gait belt   ? Activity Tolerance Patient tolerated treatment well   ? Behavior During Therapy [facility] for tasks assessed/performed   ? ?  ?  ? ?  ? ? ? ? ?Past Medical History:  ?Diagnosis Date  ? COVID 2022  ? Hypertension   ? PVD (peripheral vascular disease) (HCC)   ? ?Past Surgical History:  ?Procedure Laterality Date  ? ABDOMINAL AORTAGRAM Left 12/13/2016  ? ABDOMINAL AORTOGRAM W/LOWER EXTREMITY/notes 12/13/2016  ? ABDOMINAL AORTOGRAM W/LOWER EXTREMITY N/A 11/28/2016  ? Procedure: ABDOMINAL AORTOGRAM W/LOWER EXTREMITY;  Surgeon: Yates Decamp, MD;  [facility];  Service: Cardiovascular;  Laterality: N/A;  bilateral  ? ABDOMINAL AORTOGRAM W/LOWER EXTREMITY N/A 12/13/2016  ? Procedure: ABDOMINAL AORTOGRAM W/LOWER EXTREMITY;  Surgeon: Maeola Harman, MD;  Location: [facility] INVASIVE CV LAB;  Service: Cardiovascular;  Laterality: N/A;  unilater left leg  ? ABDOMINAL AORTOGRAM W/LOWER EXTREMITY N/A 01/09/2018  ? Procedure: ABDOMINAL AORTOGRAM W/LOWER EXTREMITY;  Surgeon: Cephus Shelling, MD;  Location: [facility] INVASIVE CV LAB;  Service: Cardiovascular;  Laterality: N/A;  ? ABDOMINAL AORTOGRAM W/LOWER EXTREMITY Left 09/22/2019  ? Procedure: ABDOMINAL AORTOGRAM W/LOWER EXTREMITY;  Surgeon: Maeola Harman, MD;  Location: [facility] INVASIVE CV LAB;  Service: Cardiovascular;  Laterality: Left;  ? ABDOMINAL AORTOGRAM W/LOWER EXTREMITY Left 12/02/2020  ?  Procedure: ABDOMINAL AORTOGRAM W/LOWER EXTREMITY;  Surgeon: Cephus Shelling, MD;  Location: [facility] INVASIVE CV LAB;  Service: Cardiovascular;  Laterality: Left;  ? ABDOMINAL AORTOGRAM W/LOWER EXTREMITY N/A 12/22/2020  ? Procedure: ABDOMINAL AORTOGRAM W/LOWER EXTREMITY;  Surgeon: Victorino Sparrow, MD;  Location: [facility] INVASIVE CV LAB;  Service: Cardiovascular;  Laterality: N/A;  ? AMPUTATION Left 02/11/2021  ? Procedure: LEFT ABOVE KNEE AMPUTATION;  Surgeon: Cephus Shelling, MD;  Location: [facility] OR;  Service: Vascular;  Laterality: Left;  ? BYPASS GRAFT POPLITEAL TO TIBIAL Left 12/31/2020  ? Procedure: LEFT JUMP GRAFT REVISION BELOW KNEE POPLITEAL TO PERONEAL;  Surgeon: Cephus Shelling, MD;  Location: [facility] OR;  Service: Vascular;  Laterality: Left;  ? EMBOLIZATION Left 12/03/2020  ? Procedure: EMBOLIZATION;  Surgeon: Leonie Douglas, MD;  [facility];  Service: Cardiovascular;  Laterality: Left;  ? FEMORAL-POPLITEAL BYPASS GRAFT Left 12/14/2016  ? Procedure: BYPASS GRAFT COMMON FEMORAL- BELOW KNEE POPLITEAL ARTERY with Bovine Patch to Below the knee poplitieal artery.;  Surgeon: Fransisco Hertz, MD;  Location: [facility] OR;  Service: Vascular;  Laterality: Left;  ? FEMORAL-POPLITEAL BYPASS GRAFT Left 01/28/2018  ? Procedure: BYPASS GRAFT FEMORAL-BELOW KNEE POPLITEAL ARTERY REDO with propaten vascular graft removable ring;  Surgeon: Cephus Shelling, MD;  [facility];  Service: Vascular;  Laterality: Left;  ? FEMUR SURGERY Right 2009  ? pt. has a rod in that leg  ? LOWER EXTREMITY ANGIOGRAPHY Left 11/28/2016  ? Procedure: Lower Extremity Angiography;  Surgeon: Yates Decamp,  MD;  [facility];  Service: Cardiovascular;  Laterality: Left;  lysis followup lower leg  ? PATCH ANGIOPLASTY Left 01/28/2018  ? Procedure: PATCH ANGIOPLASTY USING Livia SnellenXENOSURE BIOLOGIC PATCH;  Surgeon: Cephus Shellinglark, Christopher J, MD;  [facility];  Service: Vascular;  Laterality: Left;  ? PATCH ANGIOPLASTY Left 06/16/2020   ? Procedure: PATCH ANGIOPLASTY OF LEFT BELOW KNEE POPITEAL ARTERY;  Surgeon: Cephus Shellinglark, Christopher J, MD;  [facility];  Service: Vascular;  Laterality: Left;  ? PERIPHERAL VASCULAR BALLOON ANGIOPLASTY  11/28/2016  ? Procedure: PERIPHERAL VASCULAR BALLOON ANGIOPLASTY;  Surgeon: Yates DecampGanji, Jay, MD;  [facility];  Service: Cardiovascular;;  SFA  ? PERIPHERAL VASCULAR BALLOON ANGIOPLASTY  12/03/2020  ? Procedure: PERIPHERAL VASCULAR BALLOON ANGIOPLASTY;  Surgeon: Leonie DouglasHawken, Winborne N, MD;  [facility];  Service: Cardiovascular;;  TP Trunk and Peroneal  ? PERIPHERAL VASCULAR CATHETERIZATION N/A 08/11/2014  ? Procedure: Lower Extremity Angiography;  Surgeon: Yates DecampJay Ganji, MD;  [facility];  Service: Cardiovascular;  Laterality: N/A;  ? PERIPHERAL VASCULAR INTERVENTION Left 09/22/2019  ? Procedure: PERIPHERAL VASCULAR INTERVENTION;  Surgeon: Maeola Harmanain, Brandon Christopher, MD;  [facility];  Service: Cardiovascular;  Laterality: Left;  ? PERIPHERAL VASCULAR INTERVENTION Left 09/23/2019  ? Procedure: PERIPHERAL VASCULAR INTERVENTION;  Surgeon: Nada LibmanBrabham, Vance W, MD;  [facility];  Service: Cardiovascular;  Laterality: Left;  ? PERIPHERAL VASCULAR INTERVENTION Left 05/13/2020  ? Procedure: PERIPHERAL VASCULAR INTERVENTION;  Surgeon: Cephus Shellinglark, Christopher J, MD;  [facility];  Service: Cardiovascular;  Laterality: Left;  ? PERIPHERAL VASCULAR INTERVENTION Left 12/02/2020  ? Procedure: PERIPHERAL VASCULAR INTERVENTION;  Surgeon: Cephus Shellinglark, Christopher J, MD;  [facility];  Service: Cardiovascular;  Laterality: Left;  ? PERIPHERAL VASCULAR INTERVENTION  12/03/2020  ? Procedure: PERIPHERAL VASCULAR INTERVENTION;  Surgeon: Leonie DouglasHawken, Zuercher N, MD;  [facility];  Service: Cardiovascular;;  ? PERIPHERAL VASCULAR INTERVENTION Left 12/22/2020  ? Procedure: PERIPHERAL VASCULAR INTERVENTION;  Surgeon: Victorino Sparrowobins, Joshua E, MD;   [facility];  Service: Cardiovascular;  Laterality: Left;  ? PERIPHERAL VASCULAR THROMBECTOMY  11/28/2016  ? Procedure: PERIPHERAL VASCULAR THROMBECTOMY;  Surgeon: Yates DecampGanji, Jay, MD;  [facility];  Service: Cardiovascular;;  Profunda  ? PERIPHERAL VASCULAR THROMBECTOMY  11/28/2016  ? Procedure: PERIPHERAL VASCULAR THROMBECTOMY;  Surgeon: Yates DecampGanji, Jay, MD;  [facility];  Service: Cardiovascular;;  ? PERIPHERAL VASCULAR THROMBECTOMY N/A 05/12/2020  ? Procedure: PERIPHERAL VASCULAR THROMBECTOMY-Left Leg;  Surgeon: Leonie DouglasHawken, Hemberger N, MD;  [facility];  Service: Cardiovascular;  Laterality: N/A;  ? PERIPHERAL VASCULAR THROMBECTOMY N/A 05/13/2020  ? Procedure: PERIPHERAL VASCULAR THROMBECTOMY- LYSIS RECHECK;  Surgeon: Cephus Shellinglark, Christopher J, MD;  [facility];  Service: Cardiovascular;  Laterality: N/A;  ? PERIPHERAL VASCULAR THROMBECTOMY N/A 12/02/2020  ? Procedure: PERIPHERAL VASCULAR THROMBECTOMY;  Surgeon: Cephus Shellinglark, Christopher J, MD;  [facility];  Service: Cardiovascular;  Laterality: N/A;  ? PERIPHERAL VASCULAR THROMBECTOMY N/A 12/03/2020  ? Procedure: LYSIS RECHECK;  Surgeon: Leonie DouglasHawken, Nihiser N, MD;  [facility];  Service: Cardiovascular;  Laterality: N/A;  ? PULMONARY THROMBECTOMY N/A 09/23/2019  ? Procedure: LYSIS RECHECK;  Surgeon: Nada LibmanBrabham, Vance W, MD;  [facility];  Service: Cardiovascular;  Laterality: N/A;  ? THROMBECTOMY FEMORAL ARTERY Left 12/14/2016  ? Procedure: THROMBECTOMY PROFUNDA FEMORAL ARTERY;  Surgeon: Fransisco Hertzhen, Brian L, MD;  [facility];  Service:  Vascular;  Laterality: Left;  ? THROMBECTOMY FEMORAL ARTERY Left 06/16/2020  ? Procedure: Redo exposure ot Left below knee popiteal artery;  Surgeon: Cephus Shelling, MD;  Location: [facility] OR;  Service: Vascular;  Laterality: Left;  ? THROMBECTOMY OF BYPASS GRAFT FEMORAL- POPLITEAL ARTERY Left 06/16/2020  ? Procedure: THROMBECTOMY OF  BYPASS GRAFT FEMORAL-POPLITEAL ARTERY and TIBIAL ARTERY;  Surgeon: Cephus Shelling, MD;  [facility];  Service: Vascular;  Laterality: Left;  ? THROMBECTOMY OF BYPASS GRAFT FEMORAL- POPLITEAL ARTERY Left

## 2021-05-31 NOTE — Progress Notes (Signed)
?   05/31/21 1426  ?PT Education  ?Education Details HEP for posture & hip flexor stretch  Access Code: [email or web address]  ?Person(s) Educated Patient;Spouse  ?Methods Explanation;Demonstration;Tactile cues;Verbal cues;Handout  ?Comprehension Verbalized understanding;Returned demonstration;Tactile cues required;Verbal cues required;Need further instruction  ? ? ?

## 2021-06-02 ENCOUNTER — Other Ambulatory Visit: Payer: Self-pay

## 2021-06-02 ENCOUNTER — Ambulatory Visit (INDEPENDENT_AMBULATORY_CARE_PROVIDER_SITE_OTHER): Payer: Medicare Other | Admitting: Physical Therapy

## 2021-06-02 ENCOUNTER — Encounter: Payer: Self-pay | Admitting: Physical Therapy

## 2021-06-02 DIAGNOSIS — M6281 Muscle weakness (generalized): Secondary | ICD-10-CM

## 2021-06-02 DIAGNOSIS — R293 Abnormal posture: Secondary | ICD-10-CM

## 2021-06-02 DIAGNOSIS — R2689 Other abnormalities of gait and mobility: Secondary | ICD-10-CM

## 2021-06-02 DIAGNOSIS — R2681 Unsteadiness on feet: Secondary | ICD-10-CM | POA: Diagnosis not present

## 2021-06-02 NOTE — Therapy (Signed)
?OUTPATIENT PHYSICAL THERAPY PROSTHETIC TREATMENT NOTE ? ? ?Patient Name: Ovid Witman ?MRN: [id number] ?DOB:01/24/1951, 71 y.o., male ?Today's Date: 06/02/2021 ? ?PCP: Mirna Mires, MD ?REFERRING PROVIDER: Graceann Congress, PA-C ? ? PT End of Session - 06/02/21 1014   ? ? Visit Number 5   ? Number of Visits 25   ? Date for PT Re-Evaluation 08/17/21   ? Authorization Type Medicare A/B & Generic commercial   ? Progress Note Due on Visit 10   ? PT Start Time 1014   ? PT Stop Time 1057   ? PT Time Calculation (min) 43 min   ? Equipment Utilized During Treatment Gait belt   ? Activity Tolerance Patient tolerated treatment well   ? Behavior During Therapy [facility] for tasks assessed/performed   ? ?  ?  ? ?  ? ? ? ? ? ?Past Medical History:  ?Diagnosis Date  ? COVID 2022  ? Hypertension   ? PVD (peripheral vascular disease) (HCC)   ? ?Past Surgical History:  ?Procedure Laterality Date  ? ABDOMINAL AORTAGRAM Left 12/13/2016  ? ABDOMINAL AORTOGRAM W/LOWER EXTREMITY/notes 12/13/2016  ? ABDOMINAL AORTOGRAM W/LOWER EXTREMITY N/A 11/28/2016  ? Procedure: ABDOMINAL AORTOGRAM W/LOWER EXTREMITY;  Surgeon: Yates Decamp, MD;  [facility];  Service: Cardiovascular;  Laterality: N/A;  bilateral  ? ABDOMINAL AORTOGRAM W/LOWER EXTREMITY N/A 12/13/2016  ? Procedure: ABDOMINAL AORTOGRAM W/LOWER EXTREMITY;  Surgeon: Maeola Harman, MD;  Location: [facility] INVASIVE CV LAB;  Service: Cardiovascular;  Laterality: N/A;  unilater left leg  ? ABDOMINAL AORTOGRAM W/LOWER EXTREMITY N/A 01/09/2018  ? Procedure: ABDOMINAL AORTOGRAM W/LOWER EXTREMITY;  Surgeon: Cephus Shelling, MD;  Location: [facility] INVASIVE CV LAB;  Service: Cardiovascular;  Laterality: N/A;  ? ABDOMINAL AORTOGRAM W/LOWER EXTREMITY Left 09/22/2019  ? Procedure: ABDOMINAL AORTOGRAM W/LOWER EXTREMITY;  Surgeon: Maeola Harman, MD;  Location[facility];  Service: Cardiovascular;  Laterality: Left;  ? ABDOMINAL AORTOGRAM W/LOWER EXTREMITY Left 12/02/2020  ?  Procedure: ABDOMINAL AORTOGRAM W/LOWER EXTREMITY;  Surgeon: Cephus Shelling, MD;  Location: [facility] INVASIVE CV LAB;  Service: Cardiovascular;  Laterality: Left;  ? ABDOMINAL AORTOGRAM W/LOWER EXTREMITY N/A 12/22/2020  ? Procedure: ABDOMINAL AORTOGRAM W/LOWER EXTREMITY;  Surgeon: Victorino Sparrow, MD;  Location: [facility] INVASIVE CV LAB;  Service: Cardiovascular;  Laterality: N/A;  ? AMPUTATION Left 02/11/2021  ? Procedure: LEFT ABOVE KNEE AMPUTATION;  Surgeon: Cephus Shelling, MD;  Location: [facility] OR;  Service: Vascular;  Laterality: Left;  ? BYPASS GRAFT POPLITEAL TO TIBIAL Left 12/31/2020  ? Procedure: LEFT JUMP GRAFT REVISION BELOW KNEE POPLITEAL TO PERONEAL;  Surgeon: Cephus Shelling, MD;  Location: [facility] OR;  Service: Vascular;  Laterality: Left;  ? EMBOLIZATION Left 12/03/2020  ? Procedure: EMBOLIZATION;  Surgeon: Leonie Douglas, MD;  [facility];  Service: Cardiovascular;  Laterality: Left;  ? FEMORAL-POPLITEAL BYPASS GRAFT Left 12/14/2016  ? Procedure: BYPASS GRAFT COMMON FEMORAL- BELOW KNEE POPLITEAL ARTERY with Bovine Patch to Below the knee poplitieal artery.;  Surgeon: Fransisco Hertz, MD;  Location: [facility] OR;  Service: Vascular;  Laterality: Left;  ? FEMORAL-POPLITEAL BYPASS GRAFT Left 01/28/2018  ? Procedure: BYPASS GRAFT FEMORAL-BELOW KNEE POPLITEAL ARTERY REDO with propaten vascular graft removable ring;  Surgeon: Cephus Shelling, MD;  [facility] OR;  Service: Vascular;  Laterality: Left;  ? FEMUR SURGERY Right 2009  ? pt. has a rod in that leg  ? LOWER EXTREMITY ANGIOGRAPHY Left 11/28/2016  ? Procedure: Lower Extremity Angiography;  Surgeon: Jacinto Halim,  Vonna KotykJay, MD;  [facility];  Service: Cardiovascular;  Laterality: Left;  lysis followup lower leg  ? PATCH ANGIOPLASTY Left 01/28/2018  ? Procedure: PATCH ANGIOPLASTY USING Livia SnellenXENOSURE BIOLOGIC PATCH;  Surgeon: Cephus Shellinglark, Christopher J, MD;  [facility];  Service: Vascular;  Laterality: Left;  ? PATCH ANGIOPLASTY Left 06/16/2020   ? Procedure: PATCH ANGIOPLASTY OF LEFT BELOW KNEE POPITEAL ARTERY;  Surgeon: Cephus Shellinglark, Christopher J, MD;  [facility];  Service: Vascular;  Laterality: Left;  ? PERIPHERAL VASCULAR BALLOON ANGIOPLASTY  11/28/2016  ? Procedure: PERIPHERAL VASCULAR BALLOON ANGIOPLASTY;  Surgeon: Yates DecampGanji, Jay, MD;  [facility];  Service: Cardiovascular;;  SFA  ? PERIPHERAL VASCULAR BALLOON ANGIOPLASTY  12/03/2020  ? Procedure: PERIPHERAL VASCULAR BALLOON ANGIOPLASTY;  Surgeon: Leonie DouglasHawken, Grandmaison N, MD;  [facility];  Service: Cardiovascular;;  TP Trunk and Peroneal  ? PERIPHERAL VASCULAR CATHETERIZATION N/A 08/11/2014  ? Procedure: Lower Extremity Angiography;  Surgeon: Yates DecampJay Ganji, MD;  [facility];  Service: Cardiovascular;  Laterality: N/A;  ? PERIPHERAL VASCULAR INTERVENTION Left 09/22/2019  ? Procedure: PERIPHERAL VASCULAR INTERVENTION;  Surgeon: Maeola Harmanain, Brandon Christopher, MD;  [facility];  Service: Cardiovascular;  Laterality: Left;  ? PERIPHERAL VASCULAR INTERVENTION Left 09/23/2019  ? Procedure: PERIPHERAL VASCULAR INTERVENTION;  Surgeon: Nada LibmanBrabham, Vance W, MD;  [facility];  Service: Cardiovascular;  Laterality: Left;  ? PERIPHERAL VASCULAR INTERVENTION Left 05/13/2020  ? Procedure: PERIPHERAL VASCULAR INTERVENTION;  Surgeon: Cephus Shellinglark, Christopher J, MD;  [facility];  Service: Cardiovascular;  Laterality: Left;  ? PERIPHERAL VASCULAR INTERVENTION Left 12/02/2020  ? Procedure: PERIPHERAL VASCULAR INTERVENTION;  Surgeon: Cephus Shellinglark, Christopher J, MD;  [facility];  Service: Cardiovascular;  Laterality: Left;  ? PERIPHERAL VASCULAR INTERVENTION  12/03/2020  ? Procedure: PERIPHERAL VASCULAR INTERVENTION;  Surgeon: Leonie DouglasHawken, Brittian N, MD;  [facility];  Service: Cardiovascular;;  ? PERIPHERAL VASCULAR INTERVENTION Left 12/22/2020  ? Procedure: PERIPHERAL VASCULAR INTERVENTION;  Surgeon: Victorino Sparrowobins, Joshua E, MD;   [facility];  Service: Cardiovascular;  Laterality: Left;  ? PERIPHERAL VASCULAR THROMBECTOMY  11/28/2016  ? Procedure: PERIPHERAL VASCULAR THROMBECTOMY;  Surgeon: Yates DecampGanji, Jay, MD;  [facility];  Service: Cardiovascular;;  Profunda  ? PERIPHERAL VASCULAR THROMBECTOMY  11/28/2016  ? Procedure: PERIPHERAL VASCULAR THROMBECTOMY;  Surgeon: Yates DecampGanji, Jay, MD;  [facility];  Service: Cardiovascular;;  ? PERIPHERAL VASCULAR THROMBECTOMY N/A 05/12/2020  ? Procedure: PERIPHERAL VASCULAR THROMBECTOMY-Left Leg;  Surgeon: Leonie DouglasHawken, Fayad N, MD;  [facility];  Service: Cardiovascular;  Laterality: N/A;  ? PERIPHERAL VASCULAR THROMBECTOMY N/A 05/13/2020  ? Procedure: PERIPHERAL VASCULAR THROMBECTOMY- LYSIS RECHECK;  Surgeon: Cephus Shellinglark, Christopher J, MD;  [facility];  Service: Cardiovascular;  Laterality: N/A;  ? PERIPHERAL VASCULAR THROMBECTOMY N/A 12/02/2020  ? Procedure: PERIPHERAL VASCULAR THROMBECTOMY;  Surgeon: Cephus Shellinglark, Christopher J, MD;  [facility];  Service: Cardiovascular;  Laterality: N/A;  ? PERIPHERAL VASCULAR THROMBECTOMY N/A 12/03/2020  ? Procedure: LYSIS RECHECK;  Surgeon: Leonie DouglasHawken, Arabie N, MD;  [facility];  Service: Cardiovascular;  Laterality: N/A;  ? PULMONARY THROMBECTOMY N/A 09/23/2019  ? Procedure: LYSIS RECHECK;  Surgeon: Nada LibmanBrabham, Vance W, MD;  [facility];  Service: Cardiovascular;  Laterality: N/A;  ? THROMBECTOMY FEMORAL ARTERY Left 12/14/2016  ? Procedure: THROMBECTOMY PROFUNDA FEMORAL ARTERY;  Surgeon: Fransisco Hertzhen, Brian L, MD;  [facility];  Service: Vascular;  Laterality: Left;  ? THROMBECTOMY FEMORAL ARTERY Left 06/16/2020  ? Procedure: Redo exposure ot Left below knee popiteal artery;  Surgeon: Cephus Shelling, MD;  Location: [facility] OR;  Service: Vascular;  Laterality: Left;  ? THROMBECTOMY OF BYPASS GRAFT FEMORAL- POPLITEAL ARTERY Left 06/16/2020  ? Procedure: THROMBECTOMY OF  BYPASS GRAFT FEMORAL-POPLITEAL ARTERY and TIBIAL ARTERY;  Surgeon: Cephus Shelling, MD;  [facility]: [facility];  Service: Vascular;  Laterality: Left;  ? THROMBECTOMY OF BYPASS GRAFT FEMORAL- POPLITEAL ARTERY Lef

## 2021-06-06 ENCOUNTER — Other Ambulatory Visit: Payer: Self-pay

## 2021-06-06 ENCOUNTER — Ambulatory Visit (INDEPENDENT_AMBULATORY_CARE_PROVIDER_SITE_OTHER): Payer: Medicare Other | Admitting: Physical Therapy

## 2021-06-06 ENCOUNTER — Encounter: Payer: Self-pay | Admitting: Physical Therapy

## 2021-06-06 DIAGNOSIS — R2689 Other abnormalities of gait and mobility: Secondary | ICD-10-CM

## 2021-06-06 DIAGNOSIS — M6281 Muscle weakness (generalized): Secondary | ICD-10-CM | POA: Diagnosis not present

## 2021-06-06 DIAGNOSIS — R2681 Unsteadiness on feet: Secondary | ICD-10-CM

## 2021-06-06 DIAGNOSIS — R293 Abnormal posture: Secondary | ICD-10-CM

## 2021-06-06 NOTE — Therapy (Signed)
?OUTPATIENT PHYSICAL THERAPY PROSTHETIC TREATMENT NOTE ? ? ?Patient Name: Ryan [name] ?MRN: [id number] ?DOB:11/25/1950, 71 y.o., male ?Today's Date: 06/06/2021 ? ?PCP: Mirna MiresHill, Gerald, MD ?REFERRING PROVIDER: Graceann CongressBaglia, Corrina, PA-C ? ? PT End of Session - 06/06/21 0849   ? ? Visit Number 6   ? Number of Visits 25   ? Date for PT Re-Evaluation 08/17/21   ? Authorization Type Medicare A/B & Generic commercial   ? Progress Note Due on Visit 10   ? PT Start Time 0845   ? PT Stop Time 0930   ? PT Time Calculation (min) 45 min   ? Equipment Utilized During Treatment Gait belt   ? Activity Tolerance Patient tolerated treatment well   ? Behavior [facility] assessed/performed   ? ?  ?  ? ?  ? ? ? ? ? ? ?Past Medical History:  ?Diagnosis Date  ? COVID 2022  ? Hypertension   ? PVD (peripheral vascular disease) (HCC)   ? ?Past Surgical History:  ?Procedure Laterality Date  ? ABDOMINAL AORTAGRAM Left 12/13/2016  ? ABDOMINAL AORTOGRAM W/LOWER EXTREMITY/notes 12/13/2016  ? ABDOMINAL AORTOGRAM W/LOWER EXTREMITY N/A 11/28/2016  ? Procedure: ABDOMINAL AORTOGRAM W/LOWER EXTREMITY;  Surgeon: Yates DecampGanji, Jay, MD;  [facility];  Service: Cardiovascular;  Laterality: N/A;  bilateral  ? ABDOMINAL AORTOGRAM W/LOWER EXTREMITY N/A 12/13/2016  ? Procedure: ABDOMINAL AORTOGRAM W/LOWER EXTREMITY;  Surgeon: Maeola Harmanain, Brandon Christopher, MD;  [facility];  Service: Cardiovascular;  Laterality: N/A;  unilater left leg  ? ABDOMINAL AORTOGRAM W/LOWER EXTREMITY N/A 01/09/2018  ? Procedure: ABDOMINAL AORTOGRAM W/LOWER EXTREMITY;  Surgeon: Cephus Shellinglark, Christopher J, MD;  [facility];  Service: Cardiovascular;  Laterality: N/A;  ? ABDOMINAL AORTOGRAM W/LOWER EXTREMITY Left 09/22/2019  ? Procedure: ABDOMINAL AORTOGRAM W/LOWER EXTREMITY;  Surgeon: Maeola Harmanain, Brandon Christopher, MD;  [facility];  Service: Cardiovascular;  Laterality: Left;  ? ABDOMINAL AORTOGRAM W/LOWER EXTREMITY Left 12/02/2020   ? Procedure: ABDOMINAL AORTOGRAM W/LOWER EXTREMITY;  Surgeon: Cephus Shellinglark, Christopher J, MD;  [facility];  Service: Cardiovascular;  Laterality: Left;  ? ABDOMINAL AORTOGRAM W/LOWER EXTREMITY N/A 12/22/2020  ? Procedure: ABDOMINAL AORTOGRAM W/LOWER EXTREMITY;  Surgeon: Victorino Sparrowobins, Joshua E, MD;  [facility];  Service: Cardiovascular;  Laterality: N/A;  ? AMPUTATION Left 02/11/2021  ? Procedure: LEFT ABOVE KNEE AMPUTATION;  Surgeon: Cephus Shellinglark, Christopher J, MD;  [facility];  Service: Vascular;  Laterality: Left;  ? BYPASS GRAFT POPLITEAL TO TIBIAL Left 12/31/2020  ? Procedure: LEFT JUMP GRAFT REVISION BELOW KNEE POPLITEAL TO PERONEAL;  Surgeon: Cephus Shellinglark, Christopher J, MD;  [facility];  Service: Vascular;  Laterality: Left;  ? EMBOLIZATION Left 12/03/2020  ? Procedure: EMBOLIZATION;  Surgeon: Leonie DouglasHawken, Dimare N, MD;  [facility];  Service: Cardiovascular;  Laterality: Left;  ? FEMORAL-POPLITEAL BYPASS GRAFT Left 12/14/2016  ? Procedure: BYPASS GRAFT COMMON FEMORAL- BELOW KNEE POPLITEAL ARTERY with Bovine Patch to Below the knee poplitieal artery.;  Surgeon: Fransisco Hertzhen, Brian L, MD;  [facility];  Service: Vascular;  Laterality: Left;  ? FEMORAL-POPLITEAL BYPASS GRAFT Left 01/28/2018  ? Procedure: BYPASS GRAFT FEMORAL-BELOW KNEE POPLITEAL ARTERY REDO with propaten vascular graft removable ring;  Surgeon: Cephus Shellinglark, Christopher J, MD;  [facility];  Service: Vascular;  Laterality: Left;  ? FEMUR SURGERY Right 2009  ? pt. has a rod in that leg  ? LOWER EXTREMITY ANGIOGRAPHY Left 11/28/2016  ? Procedure: Lower Extremity Angiography;  Surgeon:  Yates Decamp, MD;  [facility] CV [facility];  Service: Cardiovascular;  Laterality: Left;  lysis followup lower leg  ? PATCH ANGIOPLASTY Left 01/28/2018  ? Procedure: PATCH ANGIOPLASTY USING Livia Snellen BIOLOGIC PATCH;  Surgeon: Cephus Shelling, MD;  Location: [facility] OR;  Service: Vascular;  Laterality: Left;  ? PATCH ANGIOPLASTY Left  06/16/2020  ? Procedure: PATCH ANGIOPLASTY OF LEFT BELOW KNEE POPITEAL ARTERY;  Surgeon: Cephus Shelling, MD;  Location: [facility] OR;  Service: Vascular;  Laterality: Left;  ? PERIPHERAL VASCULAR BALLOON ANGIOPLASTY  11/28/2016  ? Procedure: PERIPHERAL VASCULAR BALLOON ANGIOPLASTY;  Surgeon: Yates Decamp, MD;  [facility];  Service: Cardiovascular;;  SFA  ? PERIPHERAL VASCULAR BALLOON ANGIOPLASTY  12/03/2020  ? Procedure: PERIPHERAL VASCULAR BALLOON ANGIOPLASTY;  Surgeon: Leonie Douglas, MD;  [facility];  Service: Cardiovascular;;  TP Trunk and Peroneal  ? PERIPHERAL VASCULAR CATHETERIZATION N/A 08/11/2014  ? Procedure: Lower Extremity Angiography;  Surgeon: Yates Decamp, MD;  Location: [facility] CV LAB;  Service: Cardiovascular;  Laterality: N/A;  ? PERIPHERAL VASCULAR INTERVENTION Left 09/22/2019  ? Procedure: PERIPHERAL VASCULAR INTERVENTION;  Surgeon: Maeola Harman, MD;  Location: [facility] INVASIVE CV LAB;  Service: Cardiovascular;  Laterality: Left;  ? PERIPHERAL VASCULAR INTERVENTION Left 09/23/2019  ? Procedure: PERIPHERAL VASCULAR INTERVENTION;  Surgeon: Nada Libman, MD;  [facility];  Service: Cardiovascular;  Laterality: Left;  ? PERIPHERAL VASCULAR INTERVENTION Left 05/13/2020  ? Procedure: PERIPHERAL VASCULAR INTERVENTION;  Surgeon: Cephus Shelling, MD;  Location: [facility] INVASIVE CV LAB;  Service: Cardiovascular;  Laterality: Left;  ? PERIPHERAL VASCULAR INTERVENTION Left 12/02/2020  ? Procedure: PERIPHERAL VASCULAR INTERVENTION;  Surgeon: Cephus Shelling, MD;  Location: [facility] INVASIVE CV LAB;  Service: Cardiovascular;  Laterality: Left;  ? PERIPHERAL VASCULAR INTERVENTION  12/03/2020  ? Procedure: PERIPHERAL VASCULAR INTERVENTION;  Surgeon: Leonie Douglas, MD;  [facility];  Service: Cardiovascular;;  ? PERIPHERAL VASCULAR INTERVENTION Left 12/22/2020  ? Procedure: PERIPHERAL VASCULAR INTERVENTION;  Surgeon: Victorino Sparrow, MD;   Location: [facility] INVASIVE CV LAB;  Service: Cardiovascular;  Laterality: Left;  ? PERIPHERAL VASCULAR THROMBECTOMY  11/28/2016  ? Procedure: PERIPHERAL VASCULAR THROMBECTOMY;  Surgeon: Yates Decamp, MD;  [facility];  Service: Cardiovascular;;  Profunda  ? PERIPHERAL VASCULAR THROMBECTOMY  11/28/2016  ? Procedure: PERIPHERAL VASCULAR THROMBECTOMY;  Surgeon: Yates Decamp, MD;  [facility];  Service: Cardiovascular;;  ? PERIPHERAL VASCULAR THROMBECTOMY N/A 05/12/2020  ? Procedure: PERIPHERAL VASCULAR THROMBECTOMY-Left Leg;  Surgeon: Leonie Douglas, MD;  [facility];  Service: Cardiovascular;  Laterality: N/A;  ? PERIPHERAL VASCULAR THROMBECTOMY N/A 05/13/2020  ? Procedure: PERIPHERAL VASCULAR THROMBECTOMY- LYSIS RECHECK;  Surgeon: Cephus Shelling, MD;  [facility];  Service: Cardiovascular;  Laterality: N/A;  ? PERIPHERAL VASCULAR THROMBECTOMY N/A 12/02/2020  ? Procedure: PERIPHERAL VASCULAR THROMBECTOMY;  Surgeon: Cephus Shelling, MD;  Location: [facility] INVASIVE CV LAB;  Service: Cardiovascular;  Laterality: N/A;  ? PERIPHERAL VASCULAR THROMBECTOMY N/A 12/03/2020  ? Procedure: LYSIS RECHECK;  Surgeon: Leonie Douglas, MD;  Location: [facility] INVASIVE CV LAB;  Service: Cardiovascular;  Laterality: N/A;  ? PULMONARY THROMBECTOMY N/A 09/23/2019  ? Procedure: LYSIS RECHECK;  Surgeon: Nada Libman, MD;  [facility];  Service: Cardiovascular;  Laterality: N/A;  ? THROMBECTOMY FEMORAL ARTERY Left 12/14/2016  ? Procedure: THROMBECTOMY PROFUNDA FEMORAL ARTERY;  Surgeon: Fransisco Hertz, MD;  [facility];  Service: Vascular;  Laterality: Left;  ? THROMBECTOMY FEMORAL ARTERY Left 06/16/2020  ? Procedure: Redo exposure ot Left below knee popiteal artery;  Surgeon: Cephus Shelling, MD;  Location: [facility] OR;  Service: Vascular;  Laterality: Left;  ? THROMBECTOMY OF BYPASS GRAFT FEMORAL- POPLITEAL ARTERY Left 06/16/2020  ? Procedure: THROMBECTOMY OF  BYPASS GRAFT FEMORAL-POPLITEAL ARTERY and TIBIAL ARTERY;  Surgeon: Cephus Shelling, MD;  [facility];  Service: Vascular;  Laterality: Left;  ? THROMBECTOMY OF BYPASS GRAFT FEMORAL- POPLITEAL ARTERY L

## 2021-06-09 ENCOUNTER — Ambulatory Visit (INDEPENDENT_AMBULATORY_CARE_PROVIDER_SITE_OTHER): Payer: Medicare Other | Admitting: Physical Therapy

## 2021-06-09 DIAGNOSIS — R2681 Unsteadiness on feet: Secondary | ICD-10-CM

## 2021-06-09 DIAGNOSIS — M6281 Muscle weakness (generalized): Secondary | ICD-10-CM | POA: Diagnosis not present

## 2021-06-09 DIAGNOSIS — R293 Abnormal posture: Secondary | ICD-10-CM

## 2021-06-09 DIAGNOSIS — R2689 Other abnormalities of gait and mobility: Secondary | ICD-10-CM

## 2021-06-09 NOTE — Therapy (Signed)
?OUTPATIENT PHYSICAL THERAPY PROSTHETIC TREATMENT NOTE ? ? ?Patient Name: Ryan [name] ?MRN: [id number] ?DOB:[birth year], 71 y.o., male ?Today's Date: 06/09/2021 ? ?PCP: Mirna Mires, MD ?REFERRING PROVIDER: Graceann Congress, PA-C ? ? PT End of Session - 06/09/21 0908   ? ? Visit Number 7   ? Number of Visits 25   ? Date for PT Re-Evaluation 08/17/21   ? Authorization Type Medicare A/B & Generic commercial   ? Progress Note Due on Visit 10   ? PT Start Time 0845   ? PT Stop Time 0930   ? PT Time Calculation (min) 45 min   ? Equipment Utilized During Treatment Gait belt   ? Activity Tolerance Patient tolerated treatment well   ? Behavior During Therapy [facility] for tasks assessed/performed   ? ?  ?  ? ?  ? ? ? ? ? ? ? ?Past Medical History:  ?Diagnosis Date  ? COVID 2022  ? Hypertension   ? PVD (peripheral vascular disease) (HCC)   ? ?Past Surgical History:  ?Procedure Laterality Date  ? ABDOMINAL AORTAGRAM Left 12/13/2016  ? ABDOMINAL AORTOGRAM W/LOWER EXTREMITY/notes 12/13/2016  ? ABDOMINAL AORTOGRAM W/LOWER EXTREMITY N/A 11/28/2016  ? Procedure: ABDOMINAL AORTOGRAM W/LOWER EXTREMITY;  Surgeon: Yates Decamp, MD;  [facility];  Service: Cardiovascular;  Laterality: N/A;  bilateral  ? ABDOMINAL AORTOGRAM W/LOWER EXTREMITY N/A 12/13/2016  ? Procedure: ABDOMINAL AORTOGRAM W/LOWER EXTREMITY;  Surgeon: Maeola Harman, MD;  Location: [facility] INVASIVE CV LAB;  Service: Cardiovascular;  Laterality: N/A;  unilater left leg  ? ABDOMINAL AORTOGRAM W/LOWER EXTREMITY N/A 01/09/2018  ? Procedure: ABDOMINAL AORTOGRAM W/LOWER EXTREMITY;  Surgeon: Cephus Shelling, MD;  Location: [facility] INVASIVE CV LAB;  Service: Cardiovascular;  Laterality: N/A;  ? ABDOMINAL AORTOGRAM W/LOWER EXTREMITY Left 09/22/2019  ? Procedure: ABDOMINAL AORTOGRAM W/LOWER EXTREMITY;  Surgeon: Maeola Harman, MD;  Location: [facility] INVASIVE CV LAB;  Service: Cardiovascular;  Laterality: Left;  ? ABDOMINAL AORTOGRAM W/LOWER EXTREMITY Left  12/02/2020  ? Procedure: ABDOMINAL AORTOGRAM W/LOWER EXTREMITY;  Surgeon: Cephus Shelling, MD;  Location: [facility] INVASIVE CV LAB;  Service: Cardiovascular;  Laterality: Left;  ? ABDOMINAL AORTOGRAM W/LOWER EXTREMITY N/A 12/22/2020  ? Procedure: ABDOMINAL AORTOGRAM W/LOWER EXTREMITY;  Surgeon: Victorino Sparrow, MD;  Location: [facility] INVASIVE CV LAB;  Service: Cardiovascular;  Laterality: N/A;  ? AMPUTATION Left 02/11/2021  ? Procedure: LEFT ABOVE KNEE AMPUTATION;  Surgeon: Cephus Shelling, MD;  Location: [facility] OR;  Service: Vascular;  Laterality: Left;  ? BYPASS GRAFT POPLITEAL TO TIBIAL Left 12/31/2020  ? Procedure: LEFT JUMP GRAFT REVISION BELOW KNEE POPLITEAL TO PERONEAL;  Surgeon: Cephus Shelling, MD;  Location: [facility] OR;  Service: Vascular;  Laterality: Left;  ? EMBOLIZATION Left 12/03/2020  ? Procedure: EMBOLIZATION;  Surgeon: Leonie Douglas, MD;  [facility];  Service: Cardiovascular;  Laterality: Left;  ? FEMORAL-POPLITEAL BYPASS GRAFT Left 12/14/2016  ? Procedure: BYPASS GRAFT COMMON FEMORAL- BELOW KNEE POPLITEAL ARTERY with Bovine Patch to Below the knee poplitieal artery.;  Surgeon: Fransisco Hertz, MD;  Location: [facility] OR;  Service: Vascular;  Laterality: Left;  ? FEMORAL-POPLITEAL BYPASS GRAFT Left 01/28/2018  ? Procedure: BYPASS GRAFT FEMORAL-BELOW KNEE POPLITEAL ARTERY REDO with propaten vascular graft removable ring;  Surgeon: Cephus Shelling, MD;  [facility];  Service: Vascular;  Laterality: Left;  ? FEMUR SURGERY Right 2009  ? pt. has a rod in that leg  ? LOWER EXTREMITY ANGIOGRAPHY Left 11/28/2016  ? Procedure: Lower Extremity Angiography;  Surgeon: Yates Decamp, MD;  Location: [facility] INVASIVE CV LAB;  Service: Cardiovascular;  Laterality: Left;  lysis followup lower leg  ? PATCH ANGIOPLASTY Left 01/28/2018  ? Procedure: PATCH ANGIOPLASTY USING Livia Snellen BIOLOGIC PATCH;  Surgeon: Cephus Shelling, MD;  Location: [facility] OR;  Service: Vascular;  Laterality: Left;  ? PATCH ANGIOPLASTY  Left 06/16/2020  ? Procedure: PATCH ANGIOPLASTY OF LEFT BELOW KNEE POPITEAL ARTERY;  Surgeon: Cephus Shelling, MD;  Location: [facility] OR;  Service: Vascular;  Laterality: Left;  ? PERIPHERAL VASCULAR BALLOON ANGIOPLASTY  11/28/2016  ? Procedure: PERIPHERAL VASCULAR BALLOON ANGIOPLASTY;  Surgeon: Yates Decamp, MD;  [facility];  Service: Cardiovascular;;  SFA  ? PERIPHERAL VASCULAR BALLOON ANGIOPLASTY  12/03/2020  ? Procedure: PERIPHERAL VASCULAR BALLOON ANGIOPLASTY;  Surgeon: Leonie Douglas, MD;  [facility];  Service: Cardiovascular;;  TP Trunk and Peroneal  ? PERIPHERAL VASCULAR CATHETERIZATION N/A 08/11/2014  ? Procedure: Lower Extremity Angiography;  Surgeon: Yates Decamp, MD;  Location: [facility] INVASIVE CV LAB;  Service: Cardiovascular;  Laterality: N/A;  ? PERIPHERAL VASCULAR INTERVENTION Left 09/22/2019  ? Procedure: PERIPHERAL VASCULAR INTERVENTION;  Surgeon: Maeola Harman, MD;  Location: [facility] INVASIVE CV LAB;  Service: Cardiovascular;  Laterality: Left;  ? PERIPHERAL VASCULAR INTERVENTION Left 09/23/2019  ? Procedure: PERIPHERAL VASCULAR INTERVENTION;  Surgeon: Nada Libman, MD;  [facility];  Service: Cardiovascular;  Laterality: Left;  ? PERIPHERAL VASCULAR INTERVENTION Left 05/13/2020  ? Procedure: PERIPHERAL VASCULAR INTERVENTION;  Surgeon: Cephus Shelling, MD;  Location: [facility] INVASIVE CV LAB;  Service: Cardiovascular;  Laterality: Left;  ? PERIPHERAL VASCULAR INTERVENTION Left 12/02/2020  ? Procedure: PERIPHERAL VASCULAR INTERVENTION;  Surgeon: Cephus Shelling, MD;  Location: [facility] INVASIVE CV LAB;  Service: Cardiovascular;  Laterality: Left;  ? PERIPHERAL VASCULAR INTERVENTION  12/03/2020  ? Procedure: PERIPHERAL VASCULAR INTERVENTION;  Surgeon: Leonie Douglas, MD;  [facility];  Service: Cardiovascular;;  ? PERIPHERAL VASCULAR INTERVENTION Left 12/22/2020  ? Procedure: PERIPHERAL VASCULAR INTERVENTION;  Surgeon: Victorino Sparrow, MD;  Location: [facility] INVASIVE CV LAB;  Service: Cardiovascular;  Laterality: Left;  ? PERIPHERAL VASCULAR THROMBECTOMY  11/28/2016  ? Procedure: PERIPHERAL VASCULAR THROMBECTOMY;  Surgeon: Yates Decamp, MD;  [facility];  Service: Cardiovascular;;  Profunda  ? PERIPHERAL VASCULAR THROMBECTOMY  11/28/2016  ? Procedure: PERIPHERAL VASCULAR THROMBECTOMY;  Surgeon: Yates Decamp, MD;  [facility];  Service: Cardiovascular;;  ? PERIPHERAL VASCULAR THROMBECTOMY N/A 05/12/2020  ? Procedure: PERIPHERAL VASCULAR THROMBECTOMY-Left Leg;  Surgeon: Leonie Douglas, MD;  [facility];  Service: Cardiovascular;  Laterality: N/A;  ? PERIPHERAL VASCULAR THROMBECTOMY N/A 05/13/2020  ? Procedure: PERIPHERAL VASCULAR THROMBECTOMY- LYSIS RECHECK;  Surgeon: Cephus Shelling, MD;  [facility];  Service: Cardiovascular;  Laterality: N/A;  ? PERIPHERAL VASCULAR THROMBECTOMY N/A 12/02/2020  ? Procedure: PERIPHERAL VASCULAR THROMBECTOMY;  Surgeon: Cephus Shelling, MD;  Location: [facility] INVASIVE CV LAB;  Service: Cardiovascular;  Laterality: N/A;  ? PERIPHERAL VASCULAR THROMBECTOMY N/A 12/03/2020  ? Procedure: LYSIS RECHECK;  Surgeon: Leonie Douglas, MD;  Location: [facility] INVASIVE CV LAB;  Service: Cardiovascular;  Laterality: N/A;  ? PULMONARY THROMBECTOMY N/A 09/23/2019  ? Procedure: LYSIS RECHECK;  Surgeon: Nada Libman, MD;  [facility];  Service: Cardiovascular;  Laterality: N/A;  ? THROMBECTOMY FEMORAL ARTERY Left 12/14/2016  ? Procedure: THROMBECTOMY PROFUNDA FEMORAL ARTERY;  Surgeon: Fransisco Hertz, MD;  Location: [facility]  OR;  Service: Vascular;  Laterality: Left;  ? THROMBECTOMY FEMORAL ARTERY Left 06/16/2020  ? Procedure: Redo exposure ot Left below knee popiteal artery;  Surgeon: Cephus Shelling, MD;  Location: [facility] OR;  Service: Vascular;  Laterality: Left;  ? THROMBECTOMY OF BYPASS GRAFT FEMORAL- POPLITEAL ARTERY Left 06/16/2020  ? Procedure: THROMBECTOMY  OF BYPASS GRAFT FEMORAL-POPLITEAL ARTERY and TIBIAL ARTERY;  Surgeon: Cephus Shelling, MD;  [facility] OR;  Service: Vascular;  Laterality: Left;  ? THROMBECTOMY OF BYPASS GRAFT FEMORAL- POPLITEAL ARTER

## 2021-06-13 NOTE — Therapy (Signed)
?OUTPATIENT PHYSICAL THERAPY PROSTHETIC TREATMENT NOTE ? ? ?Patient Name: Michio Thier ?MRN: [id number] ?DOB:1951/01/14, 71 y.o., male ?Today's Date: 06/14/2021 ? ?PCP: Mirna Mires, MD ?REFERRING PROVIDER: Graceann Congress, PA-C ? ? PT End of Session - 06/14/21 1145   ? ? Visit Number 8   ? Number of Visits 25   ? Date for PT Re-Evaluation 08/17/21   ? Authorization Type Medicare A/B & Generic commercial   ? Progress Note Due on Visit 10   ? PT Start Time 1145   ? PT Stop Time 1232   ? PT Time Calculation (min) 47 min   ? Equipment Utilized During Treatment Gait belt   ? Activity Tolerance Patient tolerated treatment well   ? Behavior During Therapy [facility] for tasks assessed/performed   ? ?  ?  ? ?  ? ? ? ? ? ? ? ? ?Past Medical History:  ?Diagnosis Date  ? COVID 2022  ? Hypertension   ? PVD (peripheral vascular disease) (HCC)   ? ?Past Surgical History:  ?Procedure Laterality Date  ? ABDOMINAL AORTAGRAM Left 12/13/2016  ? ABDOMINAL AORTOGRAM W/LOWER EXTREMITY/notes 12/13/2016  ? ABDOMINAL AORTOGRAM W/LOWER EXTREMITY N/A 11/28/2016  ? Procedure: ABDOMINAL AORTOGRAM W/LOWER EXTREMITY;  Surgeon: Yates Decamp, MD;  [facility];  Service: Cardiovascular;  Laterality: N/A;  bilateral  ? ABDOMINAL AORTOGRAM W/LOWER EXTREMITY N/A 12/13/2016  ? Procedure: ABDOMINAL AORTOGRAM W/LOWER EXTREMITY;  Surgeon: Maeola Harman, MD;  Location: [facility] INVASIVE CV LAB;  Service: Cardiovascular;  Laterality: N/A;  unilater left leg  ? ABDOMINAL AORTOGRAM W/LOWER EXTREMITY N/A 01/09/2018  ? Procedure: ABDOMINAL AORTOGRAM W/LOWER EXTREMITY;  Surgeon: Cephus Shelling, MD;  Location: [facility] INVASIVE CV LAB;  Service: Cardiovascular;  Laterality: N/A;  ? ABDOMINAL AORTOGRAM W/LOWER EXTREMITY Left 09/22/2019  ? Procedure: ABDOMINAL AORTOGRAM W/LOWER EXTREMITY;  Surgeon: Maeola Harman, MD;  Location: [facility] INVASIVE CV LAB;  Service: Cardiovascular;  Laterality: Left;  ? ABDOMINAL AORTOGRAM W/LOWER EXTREMITY Left  12/02/2020  ? Procedure: ABDOMINAL AORTOGRAM W/LOWER EXTREMITY;  Surgeon: Cephus Shelling, MD;  Location: [facility] INVASIVE CV LAB;  Service: Cardiovascular;  Laterality: Left;  ? ABDOMINAL AORTOGRAM W/LOWER EXTREMITY N/A 12/22/2020  ? Procedure: ABDOMINAL AORTOGRAM W/LOWER EXTREMITY;  Surgeon: Victorino Sparrow, MD;  Location: [facility] INVASIVE CV LAB;  Service: Cardiovascular;  Laterality: N/A;  ? AMPUTATION Left 02/11/2021  ? Procedure: LEFT ABOVE KNEE AMPUTATION;  Surgeon: Cephus Shelling, MD;  Location: [facility] OR;  Service: Vascular;  Laterality: Left;  ? BYPASS GRAFT POPLITEAL TO TIBIAL Left 12/31/2020  ? Procedure: LEFT JUMP GRAFT REVISION BELOW KNEE POPLITEAL TO PERONEAL;  Surgeon: Cephus Shelling, MD;  Location: [facility] OR;  Service: Vascular;  Laterality: Left;  ? EMBOLIZATION Left 12/03/2020  ? Procedure: EMBOLIZATION;  Surgeon: Leonie Douglas, MD;  [facility]: [facility];  Service: Cardiovascular;  Laterality: Left;  ? FEMORAL-POPLITEAL BYPASS GRAFT Left 12/14/2016  ? Procedure: BYPASS GRAFT COMMON FEMORAL- BELOW KNEE POPLITEAL ARTERY with Bovine Patch to Below the knee poplitieal artery.;  Surgeon: Fransisco Hertz, MD;  Location: [facility] OR;  Service: Vascular;  Laterality: Left;  ? FEMORAL-POPLITEAL BYPASS GRAFT Left 01/28/2018  ? Procedure: BYPASS GRAFT FEMORAL-BELOW KNEE POPLITEAL ARTERY REDO with propaten vascular graft removable ring;  Surgeon: Cephus Shelling, MD;  Location: [facility] OR;  Service: Vascular;  Laterality: Left;  ? FEMUR SURGERY Right 2009  ? pt. has a rod in that leg  ? LOWER EXTREMITY ANGIOGRAPHY Left 11/28/2016  ? Procedure: Lower Extremity Angiography;  Surgeon: Yates Decamp, MD;  Location: [facility] INVASIVE CV LAB;  Service: Cardiovascular;  Laterality: Left;  lysis followup lower leg  ? PATCH ANGIOPLASTY Left 01/28/2018  ? Procedure: PATCH ANGIOPLASTY USING Livia Snellen BIOLOGIC PATCH;  Surgeon: Cephus Shelling, MD;  Location: [facility] OR;  Service: Vascular;  Laterality: Left;  ? PATCH ANGIOPLASTY  Left 06/16/2020  ? Procedure: PATCH ANGIOPLASTY OF LEFT BELOW KNEE POPITEAL ARTERY;  Surgeon: Cephus Shelling, MD;  Location: [facility] OR;  Service: Vascular;  Laterality: Left;  ? PERIPHERAL VASCULAR BALLOON ANGIOPLASTY  11/28/2016  ? Procedure: PERIPHERAL VASCULAR BALLOON ANGIOPLASTY;  Surgeon: Yates Decamp, MD;  [facility];  Service: Cardiovascular;;  SFA  ? PERIPHERAL VASCULAR BALLOON ANGIOPLASTY  12/03/2020  ? Procedure: PERIPHERAL VASCULAR BALLOON ANGIOPLASTY;  Surgeon: Leonie Douglas, MD;  [facility];  Service: Cardiovascular;;  TP Trunk and Peroneal  ? PERIPHERAL VASCULAR CATHETERIZATION N/A 08/11/2014  ? Procedure: Lower Extremity Angiography;  Surgeon: Yates Decamp, MD;  Location: [facility] INVASIVE CV LAB;  Service: Cardiovascular;  Laterality: N/A;  ? PERIPHERAL VASCULAR INTERVENTION Left 09/22/2019  ? Procedure: PERIPHERAL VASCULAR INTERVENTION;  Surgeon: Maeola Harman, MD;  Location: [facility] INVASIVE CV LAB;  Service: Cardiovascular;  Laterality: Left;  ? PERIPHERAL VASCULAR INTERVENTION Left 09/23/2019  ? Procedure: PERIPHERAL VASCULAR INTERVENTION;  Surgeon: Nada Libman, MD;  [facility];  Service: Cardiovascular;  Laterality: Left;  ? PERIPHERAL VASCULAR INTERVENTION Left 05/13/2020  ? Procedure: PERIPHERAL VASCULAR INTERVENTION;  Surgeon: Cephus Shelling, MD;  Location: [facility] INVASIVE CV LAB;  Service: Cardiovascular;  Laterality: Left;  ? PERIPHERAL VASCULAR INTERVENTION Left 12/02/2020  ? Procedure: PERIPHERAL VASCULAR INTERVENTION;  Surgeon: Cephus Shelling, MD;  Location: [facility] INVASIVE CV LAB;  Service: Cardiovascular;  Laterality: Left;  ? PERIPHERAL VASCULAR INTERVENTION  12/03/2020  ? Procedure: PERIPHERAL VASCULAR INTERVENTION;  Surgeon: Leonie Douglas, MD;  [facility];  Service: Cardiovascular;;  ? PERIPHERAL VASCULAR INTERVENTION Left 12/22/2020  ? Procedure: PERIPHERAL VASCULAR INTERVENTION;  Surgeon: Victorino Sparrow, MD;  Location: [facility] INVASIVE CV LAB;  Service: Cardiovascular;  Laterality: Left;  ? PERIPHERAL VASCULAR THROMBECTOMY  11/28/2016  ? Procedure: PERIPHERAL VASCULAR THROMBECTOMY;  Surgeon: Yates Decamp, MD;  [facility];  Service: Cardiovascular;;  Profunda  ? PERIPHERAL VASCULAR THROMBECTOMY  11/28/2016  ? Procedure: PERIPHERAL VASCULAR THROMBECTOMY;  Surgeon: Yates Decamp, MD;  [facility];  Service: Cardiovascular;;  ? PERIPHERAL VASCULAR THROMBECTOMY N/A 05/12/2020  ? Procedure: PERIPHERAL VASCULAR THROMBECTOMY-Left Leg;  Surgeon: Leonie Douglas, MD;  [facility];  Service: Cardiovascular;  Laterality: N/A;  ? PERIPHERAL VASCULAR THROMBECTOMY N/A 05/13/2020  ? Procedure: PERIPHERAL VASCULAR THROMBECTOMY- LYSIS RECHECK;  Surgeon: Cephus Shelling, MD;  [facility];  Service: Cardiovascular;  Laterality: N/A;  ? PERIPHERAL VASCULAR THROMBECTOMY N/A 12/02/2020  ? Procedure: PERIPHERAL VASCULAR THROMBECTOMY;  Surgeon: Cephus Shelling, MD;  Location: [facility] INVASIVE CV LAB;  Service: Cardiovascular;  Laterality: N/A;  ? PERIPHERAL VASCULAR THROMBECTOMY N/A 12/03/2020  ? Procedure: LYSIS RECHECK;  Surgeon: Leonie Douglas, MD;  Location: [facility] INVASIVE CV LAB;  Service: Cardiovascular;  Laterality: N/A;  ? PULMONARY THROMBECTOMY N/A 09/23/2019  ? Procedure: LYSIS RECHECK;  Surgeon: Nada Libman, MD;  [facility];  Service: Cardiovascular;  Laterality: N/A;  ? THROMBECTOMY FEMORAL ARTERY Left 12/14/2016  ? Procedure: THROMBECTOMY PROFUNDA FEMORAL ARTERY;  Surgeon: Fransisco Hertz, MD;  Location: [facility]  OR;  Service: Vascular;  Laterality: Left;  ? THROMBECTOMY FEMORAL ARTERY Left 06/16/2020  ? Procedure: Redo exposure ot Left below knee popiteal artery;  Surgeon: Cephus Shellinglark, Christopher J, MD;  Location: [facility] OR;  Service: Vascular;  Laterality: Left;  ? THROMBECTOMY OF BYPASS GRAFT FEMORAL- POPLITEAL ARTERY Left 06/16/2020  ? Procedure: THROMBECTOMY  OF BYPASS GRAFT FEMORAL-POPLITEAL ARTERY and TIBIAL ARTERY;  Surgeon: Cephus Shellinglark, Christopher J, MD;  Location: [facility] OR;  Service: Vascular;  Laterality: Left;  ? THROMBECTOMY OF BYPASS GRAFT FEMORAL- POPLITEAL ART

## 2021-06-14 ENCOUNTER — Encounter: Payer: Self-pay | Admitting: Physical Therapy

## 2021-06-14 ENCOUNTER — Encounter: Payer: Self-pay | Admitting: Physician Assistant

## 2021-06-14 ENCOUNTER — Ambulatory Visit (HOSPITAL_COMMUNITY)
Admission: RE | Admit: 2021-06-14 | Discharge: 2021-06-14 | Disposition: A | Discharge status: Home / Self Care | Payer: Medicare Other | Source: Ambulatory Visit | Attending: Vascular Surgery | Admitting: Vascular Surgery

## 2021-06-14 ENCOUNTER — Ambulatory Visit (INDEPENDENT_AMBULATORY_CARE_PROVIDER_SITE_OTHER): Payer: Medicare Other | Admitting: Physical Therapy

## 2021-06-14 ENCOUNTER — Ambulatory Visit (INDEPENDENT_AMBULATORY_CARE_PROVIDER_SITE_OTHER): Payer: Medicare Other | Admitting: Physician Assistant

## 2021-06-14 VITALS — BP 127/69 | HR 60 | Temp 97.7°F | Resp 20 | Wt 140.5 lb

## 2021-06-14 DIAGNOSIS — I779 Disorder of arteries and arterioles, unspecified: Secondary | ICD-10-CM

## 2021-06-14 DIAGNOSIS — I739 Peripheral vascular disease, unspecified: Secondary | ICD-10-CM | POA: Diagnosis not present

## 2021-06-14 DIAGNOSIS — R293 Abnormal posture: Secondary | ICD-10-CM

## 2021-06-14 DIAGNOSIS — M6281 Muscle weakness (generalized): Secondary | ICD-10-CM | POA: Diagnosis not present

## 2021-06-14 DIAGNOSIS — R2689 Other abnormalities of gait and mobility: Secondary | ICD-10-CM

## 2021-06-14 DIAGNOSIS — G546 Phantom limb syndrome with pain: Secondary | ICD-10-CM | POA: Diagnosis not present

## 2021-06-14 DIAGNOSIS — R2681 Unsteadiness on feet: Secondary | ICD-10-CM

## 2021-06-14 MED ORDER — TRAMADOL HCL 50 MG PO TABS: 50.0000 mg | ORAL_TABLET | Freq: Four times a day (QID) | ORAL | 0 refills | Status: AC | PRN

## 2021-06-14 MED ORDER — GABAPENTIN 100 MG PO CAPS: 100.0000 mg | ORAL_CAPSULE | Freq: Every day | ORAL | 3 refills | Status: DC

## 2021-06-14 NOTE — Progress Notes (Signed)
[facility] [place] ?HISTORY AND PHYSICAL  ? ?History of Present Illness:  Patient is a 71 y.o. year old male who presents for evaluation of Left AKA and right LE PAD.  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   ? He denies rest pain, non healing wounds or claudication symptoms on the right LE.  He has known sever PAD on the right LE as well. ?He does not ambulate far distances.  He is currently in PT for prothesis training.  His left AKA has fully healed.  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   ? ? He denies symptoms of claudication, non healing wounds or rest pain on the right LE.  He is medically managed on ASA and Statin daily. ? ? ? ? ?Past Medical History:  ?Diagnosis Date  ? COVID 2022  ? Hypertension   ? PVD (peripheral vascular disease) ([place])   ? ? ?Past Surgical History:  ?Procedure Laterality Date  ? ABDOMINAL AORTAGRAM Left 12/13/2016  ? ABDOMINAL AORTOGRAM W/LOWER EXTREMITY/notes 12/13/2016  ? ABDOMINAL AORTOGRAM W/LOWER EXTREMITY N/A 11/28/2016  ? Procedure: ABDOMINAL AORTOGRAM W/LOWER EXTREMITY;  Surgeon: Adrian Prows, MD;  Location: [place] CV LAB;  Service: Cardiovascular;  Laterality: N/A;  bilateral  ? ABDOMINAL AORTOGRAM W/LOWER EXTREMITY N/A 12/13/2016  ? Procedure: ABDOMINAL AORTOGRAM W/LOWER EXTREMITY;  Surgeon: Waynetta Sandy, MD;  Location: [place] CV LAB;  Service: Cardiovascular;  Laterality: N/A;  unilater left leg  ? ABDOMINAL AORTOGRAM W/LOWER EXTREMITY N/A 01/09/2018  ? Procedure: ABDOMINAL AORTOGRAM W/LOWER EXTREMITY;  Surgeon: Marty Heck, MD;  Location: [place] CV LAB;  Service: Cardiovascular;  Laterality: N/A;  ? ABDOMINAL AORTOGRAM W/LOWER EXTREMITY Left 09/22/2019  ? Procedure: ABDOMINAL  AORTOGRAM W/LOWER EXTREMITY;  Surgeon: Waynetta Sandy, MD;  Location: [place] CV LAB;  Service: Cardiovascular;  Laterality: Left;  ? ABDOMINAL AORTOGRAM W/LOWER EXTREMITY Left 12/02/2020  ? Procedure: ABDOMINAL AORTOGRAM W/LOWER EXTREMITY;  Surgeon: Marty Heck, MD;  Location: [place] CV LAB;  Service: Cardiovascular;  Laterality: Left;  ? ABDOMINAL AORTOGRAM W/LOWER EXTREMITY N/A 12/22/2020  ? Procedure: ABDOMINAL AORTOGRAM W/LOWER EXTREMITY;  Surgeon: Broadus John, MD;  Location: [place] CV LAB;  Service: Cardiovascular;  Laterality: N/A;  ? AMPUTATION Left 02/11/2021  ? Procedure: LEFT ABOVE KNEE AMPUTATION;  Surgeon: Marty Heck, MD;  Location: [place];  Service: Vascular;  Laterality: Left;  ? BYPASS GRAFT POPLITEAL TO TIBIAL Left 12/31/2020  ? Procedure: LEFT JUMP GRAFT REVISION BELOW KNEE POPLITEAL TO PERONEAL;  Surgeon: Marty Heck, MD;  Location: [place];  Service: Vascular;  Laterality: Left;  ? EMBOLIZATION Left 12/03/2020  ? Procedure: EMBOLIZATION;  Surgeon: Cherre Robins, MD;  Location: [place] CV LAB;  Service: Cardiovascular;  Laterality: Left;  ? FEMORAL-POPLITEAL BYPASS GRAFT Left 12/14/2016  ? Procedure: BYPASS GRAFT COMMON FEMORAL- BELOW KNEE POPLITEAL ARTERY with Bovine Patch to Below the knee poplitieal artery.;  Surgeon: Conrad , MD;  Location: [facility] OR;  Service: Vascular;  Laterality: Left;  ? FEMORAL-POPLITEAL BYPASS GRAFT Left 01/28/2018  ? Procedure: BYPASS GRAFT FEMORAL-BELOW KNEE POPLITEAL ARTERY REDO with propaten vascular graft removable ring;  Surgeon: Marty Heck, MD;  Location: [place];  Service: Vascular;  Laterality: Left;  ? FEMUR SURGERY Right 2009  ? pt. has a rod in that leg  ? LOWER EXTREMITY ANGIOGRAPHY Left 11/28/2016  ? Procedure: Lower Extremity Angiography;  Surgeon: Adrian Prows, MD;  Location: [place] CV LAB;  Service: Cardiovascular;  Laterality: Left;  lysis followup lower leg  ? PATCH ANGIOPLASTY  Left 01/28/2018  ? Procedure: PATCH ANGIOPLASTY USING Rueben Bash BIOLOGIC PATCH;  Surgeon: Marty Heck, MD;  Location: [place];  Service: Vascular;  Laterality: Left;  ? PATCH ANGIOPLASTY Left 06/16/2020  ? Procedure: PATCH ANGIOPLASTY OF LEFT BELOW KNEE POPITEAL ARTERY;  Surgeon: Marty Heck, MD;  Location: [place];  Service: Vascular;  Laterality: Left;  ? PERIPHERAL VASCULAR BALLOON ANGIOPLASTY  11/28/2016  ? Procedure: PERIPHERAL VASCULAR BALLOON ANGIOPLASTY;  Surgeon: Adrian Prows, MD;  Location: [place] CV LAB;  Service: Cardiovascular;;  SFA  ? PERIPHERAL VASCULAR BALLOON ANGIOPLASTY  12/03/2020  ? Procedure: PERIPHERAL VASCULAR BALLOON ANGIOPLASTY;  Surgeon: Cherre Robins, MD;  Location: [place] CV LAB;  Service: Cardiovascular;;  TP Trunk and Peroneal  ? PERIPHERAL VASCULAR CATHETERIZATION N/A 08/11/2014  ? Procedure: Lower Extremity Angiography;  Surgeon: Adrian Prows, MD;  Location: [place] CV LAB;  Service: Cardiovascular;  Laterality: N/A;  ? PERIPHERAL VASCULAR INTERVENTION Left 09/22/2019  ? Procedure: PERIPHERAL VASCULAR INTERVENTION;  Surgeon: Waynetta Sandy, MD;  Location: [place] CV LAB;  Service: Cardiovascular;  Laterality: Left;  ? PERIPHERAL VASCULAR INTERVENTION Left 09/23/2019  ? Procedure: PERIPHERAL VASCULAR INTERVENTION;  Surgeon: Serafina Mitchell, MD;  Location: [place] CV LAB;  Service: Cardiovascular;  Laterality: Left;  ? PERIPHERAL VASCULAR INTERVENTION Left 05/13/2020  ? Procedure: PERIPHERAL VASCULAR INTERVENTION;  Surgeon: Marty Heck, MD;  Location: [place] CV LAB;  Service: Cardiovascular;  Laterality: Left;  ? PERIPHERAL VASCULAR INTERVENTION Left 12/02/2020  ? Procedure: PERIPHERAL VASCULAR INTERVENTION;  Surgeon: Marty Heck, MD;  Location: [place] CV LAB;  Service: Cardiovascular;  Laterality: Left;  ? PERIPHERAL VASCULAR INTERVENTION  12/03/2020  ? Procedure: PERIPHERAL VASCULAR INTERVENTION;  Surgeon: Cherre Robins, MD;  Location: [place] CV LAB;  Service: Cardiovascular;;  ? PERIPHERAL VASCULAR INTERVENTION Left 12/22/2020  ? Procedure: PERIPHERAL VASCULAR INTERVENTION;  Surgeon: Broadus John, MD;  Location: [place] CV LAB;  Service: Cardiovascular;  Laterality: Left;  ? PERIPHERAL VASCULAR THROMBECTOMY  11/28/2016  ? Procedure: PERIPHERAL VASCULAR THROMBECTOMY;  Surgeon: Adrian Prows, MD;  Location: [place] CV LAB;  Service: Cardiovascular;;  Profunda  ? PERIPHERAL VASCULAR THROMBECTOMY  11/28/2016  ? Procedure: PERIPHERAL VASCULAR THROMBECTOMY;  Surgeon: Adrian Prows, MD;  Location: [place] CV LAB;  Service: Cardiovascular;;  ? PERIPHERAL VASCULAR THROMBECTOMY N/A 05/12/2020  ? Procedure: PERIPHERAL VASCULAR THROMBECTOMY-Left Leg;  Surgeon: Cherre Robins, MD;  Location: [place] CV LAB;  Service: Cardiovascular;  Laterality: N/A;  ? PERIPHERAL VASCULAR THROMBECTOMY N/A 05/13/2020  ? Procedure: PERIPHERAL VASCULAR THROMBECTOMY- LYSIS RECHECK;  Surgeon: Marty Heck, MD;  Location: [place] CV LAB;  Service: Cardiovascular;  Laterality: N/A;  ? PERIPHERAL VASCULAR THROMBECTOMY N/A 12/02/2020  ? Procedure: PERIPHERAL VASCULAR THROMBECTOMY;  Surgeon: Marty Heck, MD;  Location: [place] CV LAB;  Service: Cardiovascular;  Laterality: N/A;  ? PERIPHERAL VASCULAR THROMBECTOMY N/A 12/03/2020  ? Procedure: LYSIS RECHECK;  Surgeon: Cherre Robins, MD;  Location: [place] CV LAB;  Service: Cardiovascular;  Laterality: N/A;  ? PULMONARY THROMBECTOMY N/A 09/23/2019  ? Procedure: LYSIS RECHECK;  Surgeon: Serafina Mitchell, MD;  Location: [place]  CV LAB;  Service: Cardiovascular;  Laterality: N/A;  ? THROMBECTOMY FEMORAL ARTERY Left 12/14/2016  ? Procedure: THROMBECTOMY PROFUNDA FEMORAL ARTERY;  Surgeon: Conrad , MD;  Location: [place];  Service: Vascular;  Laterality: Left;  ? THROMBECTOMY FEMORAL ARTERY Left 06/16/2020  ? Procedure: Redo exposure ot Left below knee popiteal  artery;  Surgeon: Marty Heck, MD;  Location: [facility] OR;  Service: Vascular;  Laterality: Left;  ? THROMBECTOMY OF BYPASS GRAFT FEMORAL- POPLITEAL ARTERY Left 06/16/2020  ? Procedure: THROMBECTOMY OF BYPASS GRAFT F

## 2021-06-16 ENCOUNTER — Encounter: Payer: Self-pay | Admitting: Physical Therapy

## 2021-06-16 ENCOUNTER — Ambulatory Visit (INDEPENDENT_AMBULATORY_CARE_PROVIDER_SITE_OTHER): Payer: Medicare Other | Admitting: Physical Therapy

## 2021-06-16 ENCOUNTER — Encounter: Payer: Medicare Other | Admitting: Physical Therapy

## 2021-06-16 DIAGNOSIS — R293 Abnormal posture: Secondary | ICD-10-CM

## 2021-06-16 DIAGNOSIS — R2689 Other abnormalities of gait and mobility: Secondary | ICD-10-CM

## 2021-06-16 DIAGNOSIS — R2681 Unsteadiness on feet: Secondary | ICD-10-CM | POA: Diagnosis not present

## 2021-06-16 DIAGNOSIS — M6281 Muscle weakness (generalized): Secondary | ICD-10-CM

## 2021-06-16 NOTE — Therapy (Signed)
OUTPATIENT PHYSICAL THERAPY PROSTHETIC TREATMENT NOTE   Patient Name: Ryan [name] MRN: [id number] DOB:06-10-1950, 71 y.o., male Today's Date: 06/16/2021  PCP: Iona Beard, MD REFERRING PROVIDER: Karoline Caldwell, PA-C   PT End of Session - 06/16/21 1344     Visit Number 9    Number of Visits 25    Date for PT Re-Evaluation 08/17/21    Authorization Type Medicare A/B & Generic commercial    Progress Note Due on Visit 10    PT Start Time 8592    PT Stop Time 1430    PT Time Calculation (min) 45 min    Equipment Utilized During Treatment Gait belt    Activity Tolerance Patient tolerated treatment well    Behavior During Therapy WFL for tasks assessed/performed                   Past Medical History:  Diagnosis Date   COVID 2022   Hypertension    PVD (peripheral vascular disease) ([place])    Past Surgical History:  Procedure Laterality Date   ABDOMINAL AORTAGRAM Left 12/13/2016   ABDOMINAL AORTOGRAM W/LOWER EXTREMITY/notes 12/13/2016   ABDOMINAL AORTOGRAM W/LOWER EXTREMITY N/A 11/28/2016   Procedure: ABDOMINAL AORTOGRAM W/LOWER EXTREMITY;  Surgeon: Adrian Prows, MD;  Location: [facility];  Service: Cardiovascular;  Laterality: N/A;  bilateral   ABDOMINAL AORTOGRAM W/LOWER EXTREMITY N/A 12/13/2016   Procedure: ABDOMINAL AORTOGRAM W/LOWER EXTREMITY;  Surgeon: Waynetta Sandy, MD;  Location: [facility];  Service: Cardiovascular;  Laterality: N/A;  unilater left leg   ABDOMINAL AORTOGRAM W/LOWER EXTREMITY N/A 01/09/2018   Procedure: ABDOMINAL AORTOGRAM W/LOWER EXTREMITY;  Surgeon: Marty Heck, MD;  Location: [facility];  Service: Cardiovascular;  Laterality: N/A;   ABDOMINAL AORTOGRAM W/LOWER EXTREMITY Left 09/22/2019   Procedure: ABDOMINAL AORTOGRAM W/LOWER EXTREMITY;  Surgeon: Waynetta Sandy, MD;  Location: [facility];  Service: Cardiovascular;  Laterality: Left;   ABDOMINAL AORTOGRAM W/LOWER EXTREMITY Left  12/02/2020   Procedure: ABDOMINAL AORTOGRAM W/LOWER EXTREMITY;  Surgeon: Marty Heck, MD;  Location: [facility];  Service: Cardiovascular;  Laterality: Left;   ABDOMINAL AORTOGRAM W/LOWER EXTREMITY N/A 12/22/2020   Procedure: ABDOMINAL AORTOGRAM W/LOWER EXTREMITY;  Surgeon: Broadus John, MD;  Location: [facility];  Service: Cardiovascular;  Laterality: N/A;   AMPUTATION Left 02/11/2021   Procedure: LEFT ABOVE KNEE AMPUTATION;  Surgeon: Marty Heck, MD;  Location: [facility];  Service: Vascular;  Laterality: Left;   BYPASS GRAFT POPLITEAL TO TIBIAL Left 12/31/2020   Procedure: LEFT JUMP GRAFT REVISION BELOW KNEE POPLITEAL TO PERONEAL;  Surgeon: Marty Heck, MD;  Location: [place];  Service: Vascular;  Laterality: Left;   EMBOLIZATION Left 12/03/2020   Procedure: EMBOLIZATION;  Surgeon: Cherre Robins, MD;  Location: [facility];  Service: Cardiovascular;  Laterality: Left;   FEMORAL-POPLITEAL BYPASS GRAFT Left 12/14/2016   Procedure: BYPASS GRAFT COMMON FEMORAL- BELOW KNEE POPLITEAL ARTERY with Bovine Patch to Below the knee poplitieal artery.;  Surgeon: Conrad Lodi, MD;  [facility];  Service: Vascular;  Laterality: Left;   FEMORAL-POPLITEAL BYPASS GRAFT Left 01/28/2018   Procedure: BYPASS GRAFT FEMORAL-BELOW KNEE POPLITEAL ARTERY REDO with propaten vascular graft removable ring;  Surgeon: Marty Heck, MD;  Location: [place];  Service: Vascular;  Laterality: Left;   FEMUR SURGERY Right 2009   pt. has a rod in that leg   LOWER EXTREMITY ANGIOGRAPHY Left 11/28/2016   Procedure: Lower Extremity Angiography;  Surgeon: Adrian Prows, MD;  Location: [facility];  Service: Cardiovascular;  Laterality: Left;  lysis followup lower leg   PATCH ANGIOPLASTY Left 01/28/2018   Procedure: PATCH ANGIOPLASTY USING Rueben Bash BIOLOGIC PATCH;  Surgeon: Marty Heck, MD;  Location: [place];  Service: Vascular;  Laterality: Left;   PATCH ANGIOPLASTY  Left 06/16/2020   Procedure: PATCH ANGIOPLASTY OF LEFT BELOW KNEE [place] ARTERY;  Surgeon: Marty Heck, MD;  Location: [place];  Service: Vascular;  Laterality: Left;   PERIPHERAL VASCULAR BALLOON ANGIOPLASTY  11/28/2016   Procedure: PERIPHERAL VASCULAR BALLOON ANGIOPLASTY;  Surgeon: Adrian Prows, MD;  Location: [place] [facility];  Service: Cardiovascular;;  SFA   PERIPHERAL VASCULAR BALLOON ANGIOPLASTY  12/03/2020   Procedure: PERIPHERAL VASCULAR BALLOON ANGIOPLASTY;  Surgeon: Cherre Robins, MD;  Location: [facility] [facility];  Service: Cardiovascular;;  TP Trunk and Peroneal   PERIPHERAL VASCULAR CATHETERIZATION N/A 08/11/2014   Procedure: Lower Extremity Angiography;  Surgeon: Adrian Prows, MD;  Location: [place] [facility];  Service: Cardiovascular;  Laterality: N/A;   PERIPHERAL VASCULAR INTERVENTION Left 09/22/2019   Procedure: PERIPHERAL VASCULAR INTERVENTION;  Surgeon: Waynetta Sandy, MD;  Location: [facility];  Service: Cardiovascular;  Laterality: Left;   PERIPHERAL VASCULAR INTERVENTION Left 09/23/2019   Procedure: PERIPHERAL VASCULAR INTERVENTION;  Surgeon: Serafina Mitchell, MD;  Location: [facility];  Service: Cardiovascular;  Laterality: Left;   PERIPHERAL VASCULAR INTERVENTION Left 05/13/2020   Procedure: PERIPHERAL VASCULAR INTERVENTION;  Surgeon: Marty Heck, MD;  Location: [facility];  Service: Cardiovascular;  Laterality: Left;   PERIPHERAL VASCULAR INTERVENTION Left 12/02/2020   Procedure: PERIPHERAL VASCULAR INTERVENTION;  Surgeon: Marty Heck, MD;  Location: [facility];  Service: Cardiovascular;  Laterality: Left;   PERIPHERAL VASCULAR INTERVENTION  12/03/2020   Procedure: PERIPHERAL VASCULAR INTERVENTION;  Surgeon: Cherre Robins, MD;  Location: [facility];  Service: Cardiovascular;;   PERIPHERAL VASCULAR INTERVENTION Left 12/22/2020   Procedure: PERIPHERAL VASCULAR INTERVENTION;  Surgeon: Broadus John, MD;  Location: [facility];  Service: Cardiovascular;  Laterality: Left;   PERIPHERAL VASCULAR THROMBECTOMY  11/28/2016   Procedure: PERIPHERAL VASCULAR THROMBECTOMY;  Surgeon: Adrian Prows, MD;  Location: [facility] [place] [facility];  Service: Cardiovascular;;  Profunda   PERIPHERAL VASCULAR THROMBECTOMY  11/28/2016   Procedure: PERIPHERAL VASCULAR THROMBECTOMY;  Surgeon: Adrian Prows, MD;  Location: [facility];  Service: Cardiovascular;;   PERIPHERAL VASCULAR THROMBECTOMY N/A 05/12/2020   Procedure: PERIPHERAL VASCULAR THROMBECTOMY-Left Leg;  Surgeon: Cherre Robins, MD;  Location: [place];  Service: Cardiovascular;  Laterality: N/A;   PERIPHERAL VASCULAR THROMBECTOMY N/A 05/13/2020   Procedure: PERIPHERAL VASCULAR THROMBECTOMY- LYSIS RECHECK;  Surgeon: Marty Heck, MD;  Location: [facility];  Service: Cardiovascular;  Laterality: N/A;   PERIPHERAL VASCULAR THROMBECTOMY N/A 12/02/2020   Procedure: PERIPHERAL VASCULAR THROMBECTOMY;  Surgeon: Marty Heck, MD;  Location: [place] [facility];  Service: Cardiovascular;  Laterality: N/A;   PERIPHERAL VASCULAR THROMBECTOMY N/A 12/03/2020   Procedure: LYSIS RECHECK;  Surgeon: Cherre Robins, MD;  Location: [facility] [facility];  Service: Cardiovascular;  Laterality: N/A;   PULMONARY THROMBECTOMY N/A 09/23/2019   Procedure: LYSIS RECHECK;  Surgeon: Serafina Mitchell, MD;  Location: [place] [facility];  Service: Cardiovascular;  Laterality: N/A;   THROMBECTOMY FEMORAL ARTERY Left 12/14/2016   Procedure: THROMBECTOMY PROFUNDA FEMORAL ARTERY;  Surgeon: Conrad Stokesdale, MD;  [facility];  Service: Vascular;  Laterality: Left;   THROMBECTOMY FEMORAL ARTERY Left 06/16/2020   Procedure: Redo exposure ot Left below knee popiteal artery;  Surgeon: Marty Heck, MD;  Location: [facility] OR;  Service: Vascular;  Laterality: Left;   THROMBECTOMY OF BYPASS GRAFT FEMORAL- POPLITEAL ARTERY Left 06/16/2020   Procedure: THROMBECTOMY  OF BYPASS GRAFT FEMORAL-POPLITEAL ARTERY and TIBIAL ARTERY;  Surgeon: Marty Heck, MD;  Location: [place];  Service: Vascular;  Laterality: Left;   THROMBECTOMY OF BYPASS GRAFT FEMORAL- POPLITEAL ARTERY Left 12/31/2020   Procedure: THROMBECTOMY OF LEFT COMMON FEMORAL TO BELOW KNEE POPLITEAL ARTERY BYPASS GRAFT;  Surgeon: Marty Heck, MD;  Location: [place];  Service: Vascular;  Laterality: Left;   VEIN HARVEST Left 12/31/2020   Procedure: Left Greater Saphenous Vein Harvest;  Surgeon: Marty Heck, MD;  Location: [place];  Service: Vascular;  Laterality: Left;   Patient Active Problem List   Diagnosis Date Noted   Peripheral artery disease ([place]) 12/31/2020   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[place]) 05/12/2020   Thrombosis of femoro-popliteal bypass graft ([place]) 05/12/2020   Critical lower limb ischemia ([place]) 12/11/2016   Claudication in peripheral vascular disease ([place]) 11/28/2016   HTN (hypertension) 10/09/2014   Tobacco use 10/09/2014   PAD (peripheral artery disease) ([place])     REFERRING DIAG: 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  Yes: NPRS scale: arrived to PT 1-2/10 Pain location: residual limb Pain description: throbbing & aching Aggravating factors: when he removes is onset of pain which is worst, then throbbing Relieving factors: wearing prosthesis  OBJECTIVE:    POSTURE: 05/19/21:  rounded shoulders, forward head, flexed trunk , and weight shift right   LE ROM:   ROM P:passive  A:active Right 05/19/2021 Left 05/19/2021  Hip flexion      Hip extension   -15* standing  Hip  abduction      Hip adduction      Hip internal rotation      Hip external rotation      Knee flexion      Knee extension      Ankle dorsiflexion      Ankle plantarflexion      Ankle inversion      Ankle eversion       (Blank rows = not tested) MMT:   MMT Right 05/19/2021 Left 05/19/2021  Hip flexion      Hip extension   3/5 Gross functional  Hip abduction   3/5 Gross functional  Hip adduction      Hip internal rotation      Hip external rotation      Knee flexion      Knee extension      Ankle dorsiflexion      Ankle plantarflexion      Ankle inversion      Ankle eversion      (Blank rows = not tested)   TRANSFERS: Sit to stand: 05/19/21:  SBA and requires UE assist with armrests & RW touch to stabilize Stand to sit: 05/19/21:  SBA and requires UE assist with armrests & RW stabilization   GAIT:  05/19/21:  Gait pattern: step to pattern, decreased step length- Right, decreased stance time- Left, decreased hip/knee flexion- Left, circumduction- Left, Left hip hike, antalgic, trunk flexed, abducted- Left, and poor foot clearance- Left  Distance walked: 15' Assistive device utilized: Environmental consultant - 2 wheeled and TFA prosthesis  excessive UE weight bearing Level of assistance: SBA   FUNCTIONAL TESTs:  Berg Balance Scale: 05/19/21:  17/56   [facility] PT Assessment - 05/19/21 0900                Standardized Balance Assessment    Standardized Balance Assessment Berg Balance Test          Berg Balance Test    Sit to Stand Needs minimal aid to stand or to stabilize     Standing Unsupported Able to stand 2 minutes with supervision     Sitting with Back Unsupported but Feet Supported on Floor or Stool Able to sit safely and securely 2 minutes     Stand to Sit Controls descent by using hands     Transfers Able to transfer safely, definite need of hands     Standing Unsupported with Eyes Closed Unable to keep eyes closed 3 seconds but stays steady     Standing Unsupported with Feet  Together Needs help to attain position but able to stand for 30 seconds with feet together     From Standing, Reach Forward with Outstretched Arm Reaches forward but needs supervision     From Standing Position, Pick up Object from Floor Unable to try/needs assist to keep balance     From Standing Position, Turn to Look Behind Over each Shoulder Needs assist to keep from losing balance and falling     Turn 360 Degrees Needs assistance while turning     Standing Unsupported, Alternately Place Feet on Step/Stool Needs assistance to keep from falling or unable to try     Standing Unsupported, One Foot in [employer] balance while stepping or standing     Standing on One Leg Unable to try or needs assist to prevent fall     Total Score 17     Berg comment: BERG  < 36 high risk for falls (close to 100%) 46-51 moderate (>50%)   37-45 significant (>80%) 52-55 lower (> 25%)                   CURRENT PROSTHETIC WEAR ASSESSMENT: 05/19/21:  Patient is dependent with: skin check, residual limb care, care of non-amputated limb, prosthetic cleaning, ply sock cleaning, correct ply sock adjustment, proper wear schedule/adjustment, and proper weight-bearing schedule/adjustment Donning prosthesis: SBA Doffing prosthesis: Modified independence Prosthetic wear tolerance: 5-6 hours/day, 16 of 16 days since delivery.  Prosthetic weight bearing tolerance: 5 minutes with partial weight on prosthesis & intermittent support with RW Residual limb condition: 2 invaginated areas with one not clean, 1 small area on incision that may be suture working way out, dry skin, cylinderical shape, nonpitting edema,  Prosthetic description: silicon liner with velcro lanyard, ischial containment socket with flexible inner liner, SAFETY knee (single axis nonhydraulic), K2 flexible keel foot     TODAY'S TREATMENT:  06/16/2021  Prosthetic Training with Transfemoral Amputation PT reminded pt on tightening suspension strap upon  standing Pt ambulated >200' in/out of building with RW safely.  Pt neg 6.5 inch curb with RW safely with supervision and ramp with verbal cues for technique descending.  Pt ambulated 65'  X3 with cane stand alone tip & HHA modA to  HHA minA with cues on sequence, upright posture & wt shift over prosthesis in stance.  Neuromuscular Re-ed: Standing on foam pad with prosthetic foot back ~2" to RLE intermittent touch on //bars & minA tactile cues from PT: eyes open head turns 10 reps ea 4 directions and static eyes closed 10 sec 3 reps. Pt backwards gait & sidestepping LUE //bar & RUE cane 8' X 2 with supervision & verbal cues.   Therex Leg press DL 75# X 20 Sit to stands with UE support X 10 throughout session  06/14/2021 Self-care:  PT instructed in tapping prosthetic socket for vibrations and demo, verbal cues on mirror therapy to manage phantom pain.  PT reviewed medications with MD orders for taking meds for phantom pain.  Pt verbalized understanding of all of above.   Prosthetic Training with Transfemoral Amputation PT reminded pt on tightening suspension strap and folding socks over top of socket to decrease bunching up in socket. Pt verbalized understanding.  Pt ambulated >200' in/out of building with RW safely.  Pt neg curb with RW safely with supervision and ramp with verbal cues for technique descending.  Pt ambulated 90' & 130' with cane stand alone tip & HHA modA to HHA minA with cues on sequence, upright posture & wt shift over prosthesis in stance. Pt neg 4 steps rt rail/ lt cane and 4 steps left rail/ rt cane with minA guard & verbal cues.   Neuromuscular Re-ed: Pt able to reach 7" and look over shoulders without UE support with supervision. Standing on foam pad with prosthetic foot back ~2" to RLE intermittent touch on //bars & minA tactile cues from PT: eyes open head turns 10 reps ea 4 directions and static eyes closed 10 sec 3 reps. Pt backwards gait & sidestepping LUE //bar &  RUE cane 8' X 2 with supervision & verbal cues.    06/09/2021 Prosthetic Training with Transfemoral Amputation PT reviewed donning prosthesis including proper rotation and need to check strap & tighten if loose as his limb settles into socket.  Pt & wife verbalized understanding.  PT demo & verbal cues on using folded towel or small pillow to level pelvis when sitting >10 min.  Pt verbalized understanding. Pt ambulated 100' & 130' with cane stand alone tip & HHA modA with cues on sequence, upright posture & wt shift over prosthesis in stance. PT instructed in negotiating stairs with single rail & cane.  Pt neg 2 steps rt rail/ lt cane and 2 steps left rail/ rt cane with minA & constant verbal cues.   Neuromuscular Re-ed: PT demo & verbal cues on UE theraband for HEP for balance & strengthening using blue theraband.- rowing, forward reach and upward reach alternating UEs & BUEs. Pt & wife verbalized understanding.   06/06/2021 Neuromuscular Re-education: He reported that exercises are still going well.   PT reminded pt to stand still with prosthetic toes back to sound toes but only minimal cueing needed.  Sit to/from stand without using RW for stability.  UE resistance with blue theraband with supervision for balance: alt. UEs & BUEs 10 reps each row, forward reach & upward reach.  Balance activities: sidestepping & backwards gait with LUE on counter & RUE cane stand alone tip.  PT demo, verbal & tactile cues for technique to engage prosthesis & balance reactions.   Prosthetic Training with Transfemoral (TFA) prosthesis:  Pt ambulated with RW with verbal cues.  Pt ambulated 8' X 2 with cane & LUE on counter  and 28' with LUE on table & cane with supervision & verbal cues below.  Pt ambulated 20', 30' & 52' with cane stand alone tip with min A & HHA Tactile & verbal cues on step length, prosthetic knee control, wt shift over prosthesis & upright posture.   Therapeutic Exercise Leg press 75# 20  reps 2 sets. PT cued on technique including involving residual limb / prosthesis.  Hamstring stretch on leg press machine SLR w/strap DF 30 sec hold 2 reps. Nustep seat 10 level 7 with BLEs & BUEs 8 min    05/31/21 1426  PT Education  Education Details HEP for posture & hip flexor stretch  Access Code: [email or web address]  Person(s) Educated Patient;Spouse  Methods Explanation;Demonstration;Tactile cues;Verbal cues;Handout  Comprehension Verbalized understanding;Returned demonstration;Tactile cues required;Verbal cues required;Need further instruction   Access Code: [email or web address] URL: [email or web address] Date: 05/31/2021 Prepared by: Jamey Reas  Exercises - Seated Hamstring Stretch with Strap  - 2 x daily - 7 x weekly - 1 sets - 2-3 reps - 30 seconds hold - Modified Tenenbaum Stretch  - 2 x daily - 7 x weekly - 1 sets - 2 reps - 30 seconds hold - Supine Dynamic Modified Scholer Quad and Hip Flexor Dynamic Stretch  - 2 x daily - 7 x weekly - 1 sets - 2 reps - 30 seconds hold - Standing posture with back to counter  - 2 x daily - 7 x weekly - 1 sets - 1 reps - 5-10 minutes hold - Upright Stance at Door Frame Single Arm  - 3-6 x daily - 7 x weekly - 1 sets - 2 reps - 2 deep breathes hold - Upright Stance at Door Frame with Both Arms  - 3-6 x daily - 7 x weekly - 1 sets - 2 reps - 2 deep breathes hold   05/24/21 1151  PT Education  Education Details HEP at sink & residual limb pain vs phantom sensation vs phantom pain  Person(s) Educated Patient  Methods Explanation;Demonstration;Tactile cues;Verbal cues;Handout  Comprehension Verbalized understanding;Returned demonstration;Verbal cues required;Tactile cues required;Need further instruction   PROSTHETIC TRAINING: Wear time Continue wear 2x/day for 4hours.  Limb condition denies issues Weight bearing tolerated standing for 15 min without c/o limb pain.   PATIENT EDUCATION: Education details: sit to/from stand controlling prosthesis  without armrests, Proper donning tightening suspension strap and Proper wear schedule/adjustment Person educated: Patient and Spouse Education method: Explanation, Demonstration, Tactile cues, Verbal cues, and Handouts Education comprehension: verbalized understanding, verbal cues required, and needs further education     ASSESSMENT:   CLINICAL IMPRESSION: He is overall progressing but still with deficits with balance and needs min to mod HHA for ambulation with SPC. We will continue to work on this to reduce his dependence on RW.    OBJECTIVE IMPAIRMENTS Abnormal gait, decreased activity tolerance, decreased balance, decreased endurance, decreased knowledge of condition, decreased knowledge of use of DME, decreased mobility, decreased ROM, decreased strength, impaired flexibility, postural dysfunction, prosthetic dependency , and pain.    ACTIVITY LIMITATIONS community activity and occupation.    PERSONAL FACTORS Fitness, Time since onset of injury/illness/exacerbation, and 3+ comorbidities: see PMH  are also affecting patient's functional outcome.    REHAB POTENTIAL: Good   CLINICAL DECISION MAKING: Evolving/moderate complexity   EVALUATION COMPLEXITY: Moderate     GOALS: Goals reviewed with patient? Yes   SHORT TERM GOALS: Target date: 06/16/2021   Patient donnes prosthesis modified independent & verbalizes proper cleaning Goal status: MET 06/14/2021 2.  Patient  tolerates prosthesis >12 hrs total /day without skin issues or limb pain <3/10 after standing. Goal status: MET 06/14/2021   3.  Patient able to reach 7" and look over both shoulders without UE support with supervision. Goal status: MET 06/14/2021   4. Patient ambulates 200' with RW & prosthesis with supervision / verbal cues only. Goal status: MET 06/14/2021   5. Patient negotiates ramps & curbs with RW & prosthesis with supervision. Goal status: MET 06/14/2021   LONG TERM GOALS: Target date: 08/17/2021   Patient  demonstrates & verbalized understanding of prosthetic care to enable safe utilization of prosthesis. Baseline: SEE OBJECTIVE DATA Goal status: INITIAL   Patient tolerates prosthesis wear >90% of awake hours without skin or limb pain issues. Baseline: SEE OBJECTIVE DATA Goal status: INITIAL   Berg Balance >/= 30/56 to indicate lower fall risk Baseline: SEE OBJECTIVE DATA Goal status: INITIAL   Patient ambulates >500' with LRAD & prosthesis modified independent. Baseline: SEE OBJECTIVE DATA Goal status: INITIAL   Patient negotiates ramps, curbs & stairs with single rail with LRAD & prosthesis modified independent. Baseline: SEE OBJECTIVE DATA Goal status: INITIAL     PLAN: PT FREQUENCY: 2x/week   PT DURATION: 12 weeks   PLANNED INTERVENTIONS: Therapeutic exercises, Therapeutic activity, Neuromuscular re-education, Balance training, Gait training, Patient/Family education, Stair training, Vestibular training, Prosthetic training, and DME instructions   PLAN FOR NEXT SESSION: needs progress note and set updated STGs,  continue prosthetic gait with cane stand alone tip. Therapeutic exercise & balance activities.      Debbe Odea, PT, DPT 06/16/2021, 1:44 PM

## 2021-06-21 ENCOUNTER — Ambulatory Visit (INDEPENDENT_AMBULATORY_CARE_PROVIDER_SITE_OTHER): Payer: Medicare Other | Admitting: Physical Therapy

## 2021-06-21 ENCOUNTER — Encounter: Payer: Self-pay | Admitting: Physical Therapy

## 2021-06-21 DIAGNOSIS — M6281 Muscle weakness (generalized): Secondary | ICD-10-CM | POA: Diagnosis not present

## 2021-06-21 DIAGNOSIS — R2681 Unsteadiness on feet: Secondary | ICD-10-CM

## 2021-06-21 DIAGNOSIS — R2689 Other abnormalities of gait and mobility: Secondary | ICD-10-CM | POA: Diagnosis not present

## 2021-06-21 DIAGNOSIS — R293 Abnormal posture: Secondary | ICD-10-CM | POA: Diagnosis not present

## 2021-06-21 NOTE — Therapy (Signed)
OUTPATIENT PHYSICAL THERAPY PROSTHETIC TREATMENT NOTE & 10th Visit Progress Note   Patient Name: Ryan [name] MRN: [id number] DOB:Apr 10, 1950, 71 y.o., male Today's Date: 06/21/2021  PCP: Iona Beard, MD REFERRING PROVIDER: Karoline Caldwell, PA-C  Progress Note Reporting Period 05/19/2021 to 06/21/2021  See note below for Objective Data and Assessment of Progress/Goals.      PT End of Session - 06/21/21 0952     Visit Number 10    Number of Visits 25    Date for PT Re-Evaluation 08/17/21    Authorization Type Medicare A/B & Generic commercial    Progress Note Due on Visit 10    PT Start Time [phone]    PT Stop Time 1030    PT Time Calculation (min) 43 min    Equipment Utilized During Treatment Gait belt    Activity Tolerance Patient tolerated treatment well    Behavior During Therapy WFL for tasks assessed/performed                    Past Medical History:  Diagnosis Date   COVID 2022   Hypertension    PVD (peripheral vascular disease) ([place])    Past Surgical History:  Procedure Laterality Date   ABDOMINAL AORTAGRAM Left 12/13/2016   ABDOMINAL AORTOGRAM W/LOWER EXTREMITY/notes 12/13/2016   ABDOMINAL AORTOGRAM W/LOWER EXTREMITY N/A 11/28/2016   Procedure: ABDOMINAL AORTOGRAM W/LOWER EXTREMITY;  Surgeon: Adrian Prows, MD;  Location: [place] [facility];  Service: Cardiovascular;  Laterality: N/A;  bilateral   ABDOMINAL AORTOGRAM W/LOWER EXTREMITY N/A 12/13/2016   Procedure: ABDOMINAL AORTOGRAM W/LOWER EXTREMITY;  Surgeon: Waynetta Sandy, MD;  Location: [place] [facility];  Service: Cardiovascular;  Laterality: N/A;  unilater left leg   ABDOMINAL AORTOGRAM W/LOWER EXTREMITY N/A 01/09/2018   Procedure: ABDOMINAL AORTOGRAM W/LOWER EXTREMITY;  Surgeon: Marty Heck, MD;  Location: [place] [facility];  Service: Cardiovascular;  Laterality: N/A;   ABDOMINAL AORTOGRAM W/LOWER EXTREMITY Left 09/22/2019   Procedure: ABDOMINAL AORTOGRAM W/LOWER EXTREMITY;   Surgeon: Waynetta Sandy, MD;  Location: [place] [facility];  Service: Cardiovascular;  Laterality: Left;   ABDOMINAL AORTOGRAM W/LOWER EXTREMITY Left 12/02/2020   Procedure: ABDOMINAL AORTOGRAM W/LOWER EXTREMITY;  Surgeon: Marty Heck, MD;  Location: [place] [facility];  Service: Cardiovascular;  Laterality: Left;   ABDOMINAL AORTOGRAM W/LOWER EXTREMITY N/A 12/22/2020   Procedure: ABDOMINAL AORTOGRAM W/LOWER EXTREMITY;  Surgeon: Broadus John, MD;  Location: [place] [facility];  Service: Cardiovascular;  Laterality: N/A;   AMPUTATION Left 02/11/2021   Procedure: LEFT ABOVE KNEE AMPUTATION;  Surgeon: Marty Heck, MD;  Location: [place];  Service: Vascular;  Laterality: Left;   BYPASS GRAFT POPLITEAL TO TIBIAL Left 12/31/2020   Procedure: LEFT JUMP GRAFT REVISION BELOW KNEE POPLITEAL TO PERONEAL;  Surgeon: Marty Heck, MD;  Location: [place];  Service: Vascular;  Laterality: Left;   EMBOLIZATION Left 12/03/2020   Procedure: EMBOLIZATION;  Surgeon: Cherre Robins, MD;  Location: [place] [facility];  Service: Cardiovascular;  Laterality: Left;   FEMORAL-POPLITEAL BYPASS GRAFT Left 12/14/2016   Procedure: BYPASS GRAFT COMMON FEMORAL- BELOW KNEE POPLITEAL ARTERY with Bovine Patch to Below the knee poplitieal artery.;  Surgeon: Conrad [place], MD;  Location: [facility];  Service: Vascular;  Laterality: Left;   FEMORAL-POPLITEAL BYPASS GRAFT Left 01/28/2018   Procedure: BYPASS GRAFT FEMORAL-BELOW KNEE POPLITEAL ARTERY REDO with propaten vascular graft removable ring;  Surgeon: Marty Heck, MD;  Location: [place];  Service: Vascular;  Laterality: Left;  FEMUR SURGERY Right 2009   pt. has a rod in that leg   LOWER EXTREMITY ANGIOGRAPHY Left 11/28/2016   Procedure: Lower Extremity Angiography;  Surgeon: Adrian Prows, MD;  Location: [place] [facility];  Service: Cardiovascular;  Laterality: Left;  lysis followup lower leg   PATCH ANGIOPLASTY Left 01/28/2018   Procedure:  PATCH ANGIOPLASTY USING Rueben Bash BIOLOGIC PATCH;  Surgeon: Marty Heck, MD;  Location: [place];  Service: Vascular;  Laterality: Left;   PATCH ANGIOPLASTY Left 06/16/2020   Procedure: PATCH ANGIOPLASTY OF LEFT BELOW KNEE [place] ARTERY;  Surgeon: Marty Heck, MD;  Location: [place];  Service: Vascular;  Laterality: Left;   PERIPHERAL VASCULAR BALLOON ANGIOPLASTY  11/28/2016   Procedure: PERIPHERAL VASCULAR BALLOON ANGIOPLASTY;  Surgeon: Adrian Prows, MD;  Location: [place] [facility];  Service: Cardiovascular;;  SFA   PERIPHERAL VASCULAR BALLOON ANGIOPLASTY  12/03/2020   Procedure: PERIPHERAL VASCULAR BALLOON ANGIOPLASTY;  Surgeon: Cherre Robins, MD;  Location: [place] [facility];  Service: Cardiovascular;;  TP Trunk and Peroneal   PERIPHERAL VASCULAR CATHETERIZATION N/A 08/11/2014   Procedure: Lower Extremity Angiography;  Surgeon: Adrian Prows, MD;  Location: [place] [facility];  Service: Cardiovascular;  Laterality: N/A;   PERIPHERAL VASCULAR INTERVENTION Left 09/22/2019   Procedure: PERIPHERAL VASCULAR INTERVENTION;  Surgeon: Waynetta Sandy, MD;  Location: [place] [facility];  Service: Cardiovascular;  Laterality: Left;   PERIPHERAL VASCULAR INTERVENTION Left 09/23/2019   Procedure: PERIPHERAL VASCULAR INTERVENTION;  Surgeon: Serafina Mitchell, MD;  Location: [place] [facility];  Service: Cardiovascular;  Laterality: Left;   PERIPHERAL VASCULAR INTERVENTION Left 05/13/2020   Procedure: PERIPHERAL VASCULAR INTERVENTION;  Surgeon: Marty Heck, MD;  Location: [place] [facility];  Service: Cardiovascular;  Laterality: Left;   PERIPHERAL VASCULAR INTERVENTION Left 12/02/2020   Procedure: PERIPHERAL VASCULAR INTERVENTION;  Surgeon: Marty Heck, MD;  Location: [place] [facility];  Service: Cardiovascular;  Laterality: Left;   PERIPHERAL VASCULAR INTERVENTION  12/03/2020   Procedure: PERIPHERAL VASCULAR INTERVENTION;  Surgeon: Cherre Robins, MD;  Location: [place] [facility];  Service: Cardiovascular;;   PERIPHERAL VASCULAR INTERVENTION Left 12/22/2020   Procedure: PERIPHERAL VASCULAR INTERVENTION;  Surgeon: Broadus John, MD;  Location: [place] [facility];  Service: Cardiovascular;  Laterality: Left;   PERIPHERAL VASCULAR THROMBECTOMY  11/28/2016   Procedure: PERIPHERAL VASCULAR THROMBECTOMY;  Surgeon: Adrian Prows, MD;  Location: [place] [facility];  Service: Cardiovascular;;  Profunda   PERIPHERAL VASCULAR THROMBECTOMY  11/28/2016   Procedure: PERIPHERAL VASCULAR THROMBECTOMY;  Surgeon: Adrian Prows, MD;  Location: [place] [facility];  Service: Cardiovascular;;   PERIPHERAL VASCULAR THROMBECTOMY N/A 05/12/2020   Procedure: PERIPHERAL VASCULAR THROMBECTOMY-Left Leg;  Surgeon: Cherre Robins, MD;  Location: [place] [facility];  Service: Cardiovascular;  Laterality: N/A;   PERIPHERAL VASCULAR THROMBECTOMY N/A 05/13/2020   Procedure: PERIPHERAL VASCULAR THROMBECTOMY- LYSIS RECHECK;  Surgeon: Marty Heck, MD;  Location: [place] [facility];  Service: Cardiovascular;  Laterality: N/A;   PERIPHERAL VASCULAR THROMBECTOMY N/A 12/02/2020   Procedure: PERIPHERAL VASCULAR THROMBECTOMY;  Surgeon: Marty Heck, MD;  Location: [place] [facility];  Service: Cardiovascular;  Laterality: N/A;   PERIPHERAL VASCULAR THROMBECTOMY N/A 12/03/2020   Procedure: LYSIS RECHECK;  Surgeon: Cherre Robins, MD;  Location: [place] [facility];  Service: Cardiovascular;  Laterality: N/A;   PULMONARY THROMBECTOMY N/A 09/23/2019   Procedure: LYSIS RECHECK;  Surgeon: Serafina Mitchell, MD;  Location: [place] [facility];  Service:  Cardiovascular;  Laterality: N/A;   THROMBECTOMY FEMORAL ARTERY Left 12/14/2016   Procedure: THROMBECTOMY PROFUNDA FEMORAL ARTERY;  Surgeon: Conrad [place], MD;  Location: [place];  Service: Vascular;  Laterality: Left;   THROMBECTOMY FEMORAL ARTERY Left 06/16/2020   Procedure: Redo exposure ot Left below knee popiteal artery;  Surgeon: Marty Heck, MD;  Location: [facility];  Service: Vascular;  Laterality: Left;   THROMBECTOMY OF BYPASS GRAFT FEMORAL- POPLITEAL ARTERY Left 06/16/2020   Procedure: THROMBECTOMY OF BYPASS GRAFT FEMORAL-POPLITEAL ARTERY and TIBIAL ARTERY;  Surgeon: Marty Heck, MD;  Location: [place];  Service: Vascular;  Laterality: Left;   THROMBECTOMY OF BYPASS GRAFT FEMORAL- POPLITEAL ARTERY Left 12/31/2020   Procedure: THROMBECTOMY OF LEFT COMMON FEMORAL TO BELOW KNEE POPLITEAL ARTERY BYPASS GRAFT;  Surgeon: Marty Heck, MD;  Location: [place];  Service: Vascular;  Laterality: Left;   VEIN HARVEST Left 12/31/2020   Procedure: Left Greater Saphenous Vein Harvest;  Surgeon: Marty Heck, MD;  Location: [place];  Service: Vascular;  Laterality: Left;   Patient Active Problem List   Diagnosis Date Noted   Peripheral artery disease ([place]) 12/31/2020   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[place]) 05/12/2020   Thrombosis of femoro-popliteal bypass graft ([place]) 05/12/2020   Critical lower limb ischemia ([place]) 12/11/2016   Claudication in peripheral vascular disease ([place]) 11/28/2016   HTN (hypertension) 10/09/2014   Tobacco use 10/09/2014   PAD (peripheral artery disease) ([place])     REFERRING DIAG: 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  He has been walking around house or on porch holding walker with right hand only. His distal limb hurts but denies any issues of instability.   He was taking the Gabapentin & muscle relaxor as prescribed noticing less day pain. The gabapentin makes him feel drunk so he has not taken for the last 2 nights.  No difference in  pain over last 2 days with use of muscle relaxor only.    PAIN:  Are you having pain?  Yes: NPRS scale: arrived to PT 2/10 Pain location: distal residual limb Pain description: throbbing & aching Aggravating factors: when he removes is onset of pain which is worst, then throbbing Relieving factors: wearing prosthesis  OBJECTIVE:    POSTURE: 05/19/21:  rounded shoulders, forward head, flexed trunk , and weight shift right   LE ROM:   ROM P:passive  A:active Right 05/19/2021 Left 05/19/2021  Hip flexion      Hip extension   -15* standing  Hip abduction      Hip adduction      Hip internal rotation      Hip external rotation      Knee flexion      Knee extension      Ankle dorsiflexion      Ankle plantarflexion      Ankle inversion      Ankle eversion       (Blank rows = not tested) MMT:   MMT Right 05/19/2021 Left 05/19/2021  Hip flexion      Hip extension   3/5 Gross functional  Hip abduction   3/5 Gross functional  Hip adduction  Hip internal rotation      Hip external rotation      Knee flexion      Knee extension      Ankle dorsiflexion      Ankle plantarflexion      Ankle inversion      Ankle eversion      (Blank rows = not tested)   TRANSFERS: Sit to stand: 05/19/21:  SBA and requires UE assist with armrests & RW touch to stabilize Stand to sit: 05/19/21:  SBA and requires UE assist with armrests & RW stabilization   GAIT: 05/19/21:  Gait pattern: step to pattern, decreased step length- Right, decreased stance time- Left, decreased hip/knee flexion- Left, circumduction- Left, Left hip hike, antalgic, trunk flexed, abducted- Left, and poor foot clearance- Left  Distance walked: 7' Assistive device utilized: Environmental consultant - 2 wheeled and TFA prosthesis  excessive UE weight bearing Level of assistance: SBA   FUNCTIONAL TESTs:  Berg Balance Scale: 05/19/21:  17/56   [facility] PT Assessment - 05/19/21 0900                Standardized Balance Assessment     Standardized Balance Assessment Berg Balance Test          Berg Balance Test    Sit to Stand Needs minimal aid to stand or to stabilize     Standing Unsupported Able to stand 2 minutes with supervision     Sitting with Back Unsupported but Feet Supported on Floor or Stool Able to sit safely and securely 2 minutes     Stand to Sit Controls descent by using hands     Transfers Able to transfer safely, definite need of hands     Standing Unsupported with Eyes Closed Unable to keep eyes closed 3 seconds but stays steady     Standing Unsupported with Feet Together Needs help to attain position but able to stand for 30 seconds with feet together     From Standing, Reach Forward with Outstretched Arm Reaches forward but needs supervision     From Standing Position, Pick up Object from Floor Unable to try/needs assist to keep balance     From Standing Position, Turn to Look Behind Over each Shoulder Needs assist to keep from losing balance and falling     Turn 360 Degrees Needs assistance while turning     Standing Unsupported, Alternately Place Feet on Step/Stool Needs assistance to keep from falling or unable to try     Standing Unsupported, One Foot in [employer] balance while stepping or standing     Standing on One Leg Unable to try or needs assist to prevent fall     Total Score 17     Berg comment: BERG  < 36 high risk for falls (close to 100%) 46-51 moderate (>50%)   37-45 significant (>80%) 52-55 lower (> 25%)                   CURRENT PROSTHETIC WEAR ASSESSMENT: 05/19/21:  Patient is dependent with: skin check, residual limb care, care of non-amputated limb, prosthetic cleaning, ply sock cleaning, correct ply sock adjustment, proper wear schedule/adjustment, and proper weight-bearing schedule/adjustment Donning prosthesis: SBA Doffing prosthesis: Modified independence Prosthetic wear tolerance: 5-6 hours/day, 16 of 16 days since delivery.  Prosthetic weight bearing tolerance: 5  minutes with partial weight on prosthesis & intermittent support with RW Residual limb condition: 2 invaginated areas with one not clean, 1 small area on incision that may  be suture working way out, dry skin, cylinderical shape, nonpitting edema,  Prosthetic description: silicon liner with velcro lanyard, ischial containment socket with flexible inner liner, SAFETY knee (single axis nonhydraulic), K2 flexible keel foot     TODAY'S TREATMENT:  06/21/2021 Prosthetic Training with Transfemoral Amputation PT reviewed donning with tightening suspension strap.  When his limb seats deeper into socket & strap is not tightened, then his limb pistons in socket and can be part of distal limb pain. PT instructed in adjusting ply socks. Too many can make him "hammock" inside socket causing distal limb pain. He verbalized understanding.  PT instructed in picking up items off floor maintaining prosthetic knee extended & flexing rt knee. He requires UE support on RW with verbal cues and without UE support minA to reach within 6" of floor.  Pt ambulated 80' X 2 with stand alone tip cane with modA HHA. Verbal & tactile cues on initial contact as soon as foot swings forward, wt shift over prosthesis in stance and upright posture.  Worked on sit to/from stand from chairs without armrests touching cane stand alone tip with cues on technique.  5 reps which he improved with ea rep & PT instruction.   06/16/2021 Prosthetic Training with Transfemoral Amputation PT reminded pt on tightening suspension strap upon standing Pt ambulated >200' in/out of building with RW safely.  Pt neg 6.5 inch curb with RW safely with supervision and ramp with verbal cues for technique descending.  Pt ambulated 8'  X3 with cane stand alone tip & HHA modA to [place] with cues on sequence, upright posture & wt shift over prosthesis in stance.  Neuromuscular Re-ed: Standing on foam pad with prosthetic foot back ~2" to RLE intermittent touch  on //bars & minA tactile cues from PT: eyes open head turns 10 reps ea 4 directions and static eyes closed 10 sec 3 reps. Pt backwards gait & sidestepping LUE //bar & RUE cane 8' X 2 with supervision & verbal cues.   Therex Leg press DL 75# X 20 Sit to stands with UE support X 10 throughout session  06/14/2021 Self-care:  PT instructed in tapping prosthetic socket for vibrations and demo, verbal cues on mirror therapy to manage phantom pain.  PT reviewed medications with MD orders for taking meds for phantom pain.  Pt verbalized understanding of all of above.   Prosthetic Training with Transfemoral Amputation PT reminded pt on tightening suspension strap and folding socks over top of socket to decrease bunching up in socket. Pt verbalized understanding.  Pt ambulated >200' in/out of building with RW safely.  Pt neg curb with RW safely with supervision and ramp with verbal cues for technique descending.  Pt ambulated 44' & 130' with cane stand alone tip & HHA modA to HHA minA with cues on sequence, upright posture & wt shift over prosthesis in stance. Pt neg 4 steps rt rail/ lt cane and 4 steps left rail/ rt cane with minA guard & verbal cues.   Neuromuscular Re-ed: Pt able to reach 7" and look over shoulders without UE support with supervision. Standing on foam pad with prosthetic foot back ~2" to RLE intermittent touch on //bars & minA tactile cues from PT: eyes open head turns 10 reps ea 4 directions and static eyes closed 10 sec 3 reps. Pt backwards gait & sidestepping LUE //bar & RUE cane 8' X 2 with supervision & verbal cues.     05/31/21 1426  PT Education  Education Details HEP  for posture & hip flexor stretch  Access Code: [email or web address]  Person(s) Educated Patient;Spouse  Methods Explanation;Demonstration;Tactile cues;Verbal cues;Handout  Comprehension Verbalized understanding;Returned demonstration;Tactile cues required;Verbal cues required;Need further instruction   Access Code:  [email or web address] URL: [email or web address] Date: 05/31/2021 Prepared by: Jamey Reas  Exercises - Seated Hamstring Stretch with Strap  - 2 x daily - 7 x weekly - 1 sets - 2-3 reps - 30 seconds hold - Modified Rhem Stretch  - 2 x daily - 7 x weekly - 1 sets - 2 reps - 30 seconds hold - Supine Dynamic Modified Eudy Quad and Hip Flexor Dynamic Stretch  - 2 x daily - 7 x weekly - 1 sets - 2 reps - 30 seconds hold - Standing posture with back to counter  - 2 x daily - 7 x weekly - 1 sets - 1 reps - 5-10 minutes hold - Upright Stance at Door Frame Single Arm  - 3-6 x daily - 7 x weekly - 1 sets - 2 reps - 2 deep breathes hold - Upright Stance at Door Frame with Both Arms  - 3-6 x daily - 7 x weekly - 1 sets - 2 reps - 2 deep breathes hold   05/24/21 1151  PT Education  Education Details HEP at sink & residual limb pain vs phantom sensation vs phantom pain  Person(s) Educated Patient  Methods Explanation;Demonstration;Tactile cues;Verbal cues;Handout  Comprehension Verbalized understanding;Returned demonstration;Verbal cues required;Tactile cues required;Need further instruction   PROSTHETIC TRAINING: Wear time Continue wear 2x/day for 4hours.  Limb condition denies issues Weight bearing tolerated standing for 15 min without c/o limb pain.   PATIENT EDUCATION: Education details: sit to/from stand controlling prosthesis without armrests, Proper donning tightening suspension strap and Proper wear schedule/adjustment Person educated: Patient and Spouse Education method: Explanation, Demonstration, Tactile cues, Verbal cues, and Handouts Education comprehension: verbalized understanding, verbal cues required, and needs further education     ASSESSMENT:   CLINICAL IMPRESSION: Pt. has attended 10 visits since last progress note.  See objective data for updated information.  Pt. has made gains towards improving his mobility with prosthesis for community accessibility without w/c.   Pt. may continue to benefit from skilled PT services to continue progression towards reaching established goals and reduced risk of falls due to presentation.   OBJECTIVE IMPAIRMENTS Abnormal gait, decreased activity tolerance, decreased balance, decreased endurance, decreased knowledge of condition, decreased knowledge of use of DME, decreased mobility, decreased ROM, decreased strength, impaired flexibility, postural dysfunction, prosthetic dependency , and pain.    ACTIVITY LIMITATIONS community activity and occupation.    PERSONAL FACTORS Fitness, Time since onset of injury/illness/exacerbation, and 3+ comorbidities: see PMH  are also affecting patient's functional outcome.    REHAB POTENTIAL: Good   CLINICAL DECISION MAKING: Evolving/moderate complexity   EVALUATION COMPLEXITY: Moderate     GOALS: Goals reviewed with patient? Yes   SHORT TERM GOALS: Target date: 06/16/2021   Patient donnes prosthesis modified independent & verbalizes proper cleaning Goal status: MET 06/14/2021 2.  Patient tolerates prosthesis >12 hrs total /day without skin issues or limb pain <3/10 after standing. Goal status: MET 06/14/2021   3.  Patient able to reach 7" and look over both shoulders without UE support with supervision. Goal status: MET 06/14/2021   4. Patient ambulates 200' with RW & prosthesis with supervision / verbal cues only. Goal status: MET 06/14/2021   5. Patient negotiates ramps & curbs with RW & prosthesis with supervision. Goal status: MET 06/14/2021   SHORT TERM  GOALS: Target date: 07/14/2021   Patient verbalized proper adjustment of ply socks. Goal status: ongoing 2.  Patient tolerates prosthesis >90% awake hrs /day without skin issues or limb pain </= 1/10 after standing / walking. Goal status: ongoing   3.  Patient able to pick up items from floor without UE support with supervision. Goal status: ongoing   4. Patient ambulates 100' with cane stand alone tip & prosthesis with  min guard. Goal status: ongoing  LONG TERM GOALS: Target date: 08/17/2021   Patient demonstrates & verbalized understanding of prosthetic care to enable safe utilization of prosthesis. Baseline: SEE OBJECTIVE DATA Goal status: INITIAL   Patient tolerates prosthesis wear >90% of awake hours without skin or limb pain issues. Baseline: SEE OBJECTIVE DATA Goal status: INITIAL   Berg Balance >/= 30/56 to indicate lower fall risk Baseline: SEE OBJECTIVE DATA Goal status: INITIAL   Patient ambulates >500' with LRAD & prosthesis modified independent. Baseline: SEE OBJECTIVE DATA Goal status: INITIAL   Patient negotiates ramps, curbs & stairs with single rail with LRAD & prosthesis modified independent. Baseline: SEE OBJECTIVE DATA Goal status: INITIAL     PLAN: PT FREQUENCY: 2x/week   PT DURATION: 12 weeks   PLANNED INTERVENTIONS: Therapeutic exercises, Therapeutic activity, Neuromuscular re-education, Balance training, Gait training, Patient/Family education, Stair training, Vestibular training, Prosthetic training, and DME instructions   PLAN FOR NEXT SESSION: continue prosthetic gait with cane stand alone tip. Therapeutic exercise & balance activities.      Jamey Reas, PT, DPT 06/21/2021, 12:46 PM

## 2021-06-22 ENCOUNTER — Other Ambulatory Visit: Payer: Self-pay | Admitting: *Deleted

## 2021-06-22 DIAGNOSIS — I779 Disorder of arteries and arterioles, unspecified: Secondary | ICD-10-CM

## 2021-06-22 DIAGNOSIS — I739 Peripheral vascular disease, unspecified: Secondary | ICD-10-CM

## 2021-06-23 ENCOUNTER — Ambulatory Visit (INDEPENDENT_AMBULATORY_CARE_PROVIDER_SITE_OTHER): Payer: Medicare Other | Admitting: Physical Therapy

## 2021-06-23 ENCOUNTER — Encounter: Payer: Self-pay | Admitting: Physical Therapy

## 2021-06-23 ENCOUNTER — Encounter: Payer: Medicare Other | Admitting: Physical Therapy

## 2021-06-23 DIAGNOSIS — R2681 Unsteadiness on feet: Secondary | ICD-10-CM | POA: Diagnosis not present

## 2021-06-23 DIAGNOSIS — R2689 Other abnormalities of gait and mobility: Secondary | ICD-10-CM

## 2021-06-23 DIAGNOSIS — R293 Abnormal posture: Secondary | ICD-10-CM | POA: Diagnosis not present

## 2021-06-23 DIAGNOSIS — M6281 Muscle weakness (generalized): Secondary | ICD-10-CM | POA: Diagnosis not present

## 2021-06-23 NOTE — Therapy (Addendum)
OUTPATIENT PHYSICAL THERAPY PROSTHETIC TREATMENT NOTE   Patient Name: Ryan [name] MRN: [id number] DOB:[birth year], 71 y.o., male Today's Date: 06/23/2021  PCP: Iona Beard, MD REFERRING PROVIDER: Iona Beard, MD       PT End of Session - 06/23/21 1509     Visit Number 11    Number of Visits 25    Date for PT Re-Evaluation 08/17/21    Authorization Type Medicare A/B & Generic commercial    Progress Note Due on Visit 10    PT Start Time 5462    PT Stop Time 1515    PT Time Calculation (min) 43 min    Equipment Utilized During Treatment Gait belt    Activity Tolerance Patient tolerated treatment well    Behavior During Therapy WFL for tasks assessed/performed                     Past Medical History:  Diagnosis Date   COVID 2022   Hypertension    PVD (peripheral vascular disease) ([place])    Past Surgical History:  Procedure Laterality Date   ABDOMINAL AORTAGRAM Left 12/13/2016   ABDOMINAL AORTOGRAM W/LOWER EXTREMITY/notes 12/13/2016   ABDOMINAL AORTOGRAM W/LOWER EXTREMITY N/A 11/28/2016   Procedure: ABDOMINAL AORTOGRAM W/LOWER EXTREMITY;  Surgeon: Adrian Prows, MD;  Location: [facility];  Service: Cardiovascular;  Laterality: N/A;  bilateral   ABDOMINAL AORTOGRAM W/LOWER EXTREMITY N/A 12/13/2016   Procedure: ABDOMINAL AORTOGRAM W/LOWER EXTREMITY;  Surgeon: Waynetta Sandy, MD;  Location: [facility];  Service: Cardiovascular;  Laterality: N/A;  unilater left leg   ABDOMINAL AORTOGRAM W/LOWER EXTREMITY N/A 01/09/2018   Procedure: ABDOMINAL AORTOGRAM W/LOWER EXTREMITY;  Surgeon: Marty Heck, MD;  Location: [facility];  Service: Cardiovascular;  Laterality: N/A;   ABDOMINAL AORTOGRAM W/LOWER EXTREMITY Left 09/22/2019   Procedure: ABDOMINAL AORTOGRAM W/LOWER EXTREMITY;  Surgeon: Waynetta Sandy, MD;  Location: [facility];  Service: Cardiovascular;  Laterality: Left;   ABDOMINAL AORTOGRAM W/LOWER EXTREMITY  Left 12/02/2020   Procedure: ABDOMINAL AORTOGRAM W/LOWER EXTREMITY;  Surgeon: Marty Heck, MD;  Location: [facility];  Service: Cardiovascular;  Laterality: Left;   ABDOMINAL AORTOGRAM W/LOWER EXTREMITY N/A 12/22/2020   Procedure: ABDOMINAL AORTOGRAM W/LOWER EXTREMITY;  Surgeon: Broadus John, MD;  Location: [facility];  Service: Cardiovascular;  Laterality: N/A;   AMPUTATION Left 02/11/2021   Procedure: LEFT ABOVE KNEE AMPUTATION;  Surgeon: Marty Heck, MD;  Location: [facility];  Service: Vascular;  Laterality: Left;   BYPASS GRAFT POPLITEAL TO TIBIAL Left 12/31/2020   Procedure: LEFT JUMP GRAFT REVISION BELOW KNEE POPLITEAL TO PERONEAL;  Surgeon: Marty Heck, MD;  Location: [facility];  Service: Vascular;  Laterality: Left;   EMBOLIZATION Left 12/03/2020   Procedure: EMBOLIZATION;  Surgeon: Cherre Robins, MD;  Location: [facility];  Service: Cardiovascular;  Laterality: Left;   FEMORAL-POPLITEAL BYPASS GRAFT Left 12/14/2016   Procedure: BYPASS GRAFT COMMON FEMORAL- BELOW KNEE POPLITEAL ARTERY with Bovine Patch to Below the knee poplitieal artery.;  Surgeon: Conrad [place], MD;  Location: [facility];  Service: Vascular;  Laterality: Left;   FEMORAL-POPLITEAL BYPASS GRAFT Left 01/28/2018   Procedure: BYPASS GRAFT FEMORAL-BELOW KNEE POPLITEAL ARTERY REDO with propaten vascular graft removable ring;  Surgeon: Marty Heck, MD;  Location: [place];  Service: Vascular;  Laterality: Left;   FEMUR SURGERY Right 2009   pt. has a rod in that leg   LOWER EXTREMITY ANGIOGRAPHY Left 11/28/2016  Procedure: Lower Extremity Angiography;  Surgeon: Adrian Prows, MD;  Location: [facility];  Service: Cardiovascular;  Laterality: Left;  lysis followup lower leg   PATCH ANGIOPLASTY Left 01/28/2018   Procedure: PATCH ANGIOPLASTY USING Rueben Bash BIOLOGIC PATCH;  Surgeon: Marty Heck, MD;  Location: [place];  Service: Vascular;  Laterality: Left;   PATCH  ANGIOPLASTY Left 06/16/2020   Procedure: PATCH ANGIOPLASTY OF LEFT BELOW KNEE [place] ARTERY;  Surgeon: Marty Heck, MD;  Location: [place];  Service: Vascular;  Laterality: Left;   PERIPHERAL VASCULAR BALLOON ANGIOPLASTY  11/28/2016   Procedure: PERIPHERAL VASCULAR BALLOON ANGIOPLASTY;  Surgeon: Adrian Prows, MD;  Location: [facility];  Service: Cardiovascular;;  SFA   PERIPHERAL VASCULAR BALLOON ANGIOPLASTY  12/03/2020   Procedure: PERIPHERAL VASCULAR BALLOON ANGIOPLASTY;  Surgeon: Cherre Robins, MD;  Location: [place] [place] [facility];  Service: Cardiovascular;;  TP Trunk and Peroneal   PERIPHERAL VASCULAR CATHETERIZATION N/A 08/11/2014   Procedure: Lower Extremity Angiography;  Surgeon: Adrian Prows, MD;  Location: [facility];  Service: Cardiovascular;  Laterality: N/A;   PERIPHERAL VASCULAR INTERVENTION Left 09/22/2019   Procedure: PERIPHERAL VASCULAR INTERVENTION;  Surgeon: Waynetta Sandy, MD;  Location: [facility];  Service: Cardiovascular;  Laterality: Left;   PERIPHERAL VASCULAR INTERVENTION Left 09/23/2019   Procedure: PERIPHERAL VASCULAR INTERVENTION;  Surgeon: Serafina Mitchell, MD;  Location: [facility];  Service: Cardiovascular;  Laterality: Left;   PERIPHERAL VASCULAR INTERVENTION Left 05/13/2020   Procedure: PERIPHERAL VASCULAR INTERVENTION;  Surgeon: Marty Heck, MD;  Location: [facility];  Service: Cardiovascular;  Laterality: Left;   PERIPHERAL VASCULAR INTERVENTION Left 12/02/2020   Procedure: PERIPHERAL VASCULAR INTERVENTION;  Surgeon: Marty Heck, MD;  Location: [facility];  Service: Cardiovascular;  Laterality: Left;   PERIPHERAL VASCULAR INTERVENTION  12/03/2020   Procedure: PERIPHERAL VASCULAR INTERVENTION;  Surgeon: Cherre Robins, MD;  Location: [facility];  Service: Cardiovascular;;   PERIPHERAL VASCULAR INTERVENTION Left 12/22/2020   Procedure: PERIPHERAL VASCULAR INTERVENTION;  Surgeon:  Broadus John, MD;  Location: [facility];  Service: Cardiovascular;  Laterality: Left;   PERIPHERAL VASCULAR THROMBECTOMY  11/28/2016   Procedure: PERIPHERAL VASCULAR THROMBECTOMY;  Surgeon: Adrian Prows, MD;  Location: [facility];  Service: Cardiovascular;;  [place]   PERIPHERAL VASCULAR THROMBECTOMY  11/28/2016   Procedure: PERIPHERAL VASCULAR THROMBECTOMY;  Surgeon: Adrian Prows, MD;  Location: [facility];  Service: Cardiovascular;;   PERIPHERAL VASCULAR THROMBECTOMY N/A 05/12/2020   Procedure: PERIPHERAL VASCULAR THROMBECTOMY-Left Leg;  Surgeon: Cherre Robins, MD;  Location: [facility] [facility];  Service: Cardiovascular;  Laterality: N/A;   PERIPHERAL VASCULAR THROMBECTOMY N/A 05/13/2020   Procedure: PERIPHERAL VASCULAR THROMBECTOMY- LYSIS RECHECK;  Surgeon: Marty Heck, MD;  Location: [facility];  Service: Cardiovascular;  Laterality: N/A;   PERIPHERAL VASCULAR THROMBECTOMY N/A 12/02/2020   Procedure: PERIPHERAL VASCULAR THROMBECTOMY;  Surgeon: Marty Heck, MD;  Location: [facility];  Service: Cardiovascular;  Laterality: N/A;   PERIPHERAL VASCULAR THROMBECTOMY N/A 12/03/2020   Procedure: LYSIS RECHECK;  Surgeon: Cherre Robins, MD;  Location: [facility];  Service: Cardiovascular;  Laterality: N/A;   PULMONARY THROMBECTOMY N/A 09/23/2019   Procedure: LYSIS RECHECK;  Surgeon: Serafina Mitchell, MD;  Location: [facility];  Service: Cardiovascular;  Laterality: N/A;   THROMBECTOMY FEMORAL ARTERY Left 12/14/2016   Procedure: THROMBECTOMY PROFUNDA FEMORAL ARTERY;  Surgeon: Adele Barthel  L, MD;  Location: [place] [place];  Service: Vascular;  Laterality: Left;   THROMBECTOMY FEMORAL ARTERY Left 06/16/2020   Procedure: Redo exposure ot Left below knee popiteal artery;  Surgeon: Marty Heck, MD;  Location: [facility];  Service: Vascular;  Laterality: Left;   THROMBECTOMY OF BYPASS GRAFT FEMORAL- POPLITEAL ARTERY Left 06/16/2020    Procedure: THROMBECTOMY OF BYPASS GRAFT FEMORAL-POPLITEAL ARTERY and TIBIAL ARTERY;  Surgeon: Marty Heck, MD;  Location: [facility] [place];  Service: Vascular;  Laterality: Left;   THROMBECTOMY OF BYPASS GRAFT FEMORAL- POPLITEAL ARTERY Left 12/31/2020   Procedure: THROMBECTOMY OF LEFT COMMON FEMORAL TO BELOW KNEE POPLITEAL ARTERY BYPASS GRAFT;  Surgeon: Marty Heck, MD;  Location: [place];  Service: Vascular;  Laterality: Left;   VEIN HARVEST Left 12/31/2020   Procedure: Left Greater Saphenous Vein Harvest;  Surgeon: Marty Heck, MD;  Location: [facility];  Service: Vascular;  Laterality: Left;   Patient Active Problem List   Diagnosis Date Noted   Peripheral artery disease ([place]) 12/31/2020   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[place]) 05/12/2020   Thrombosis of femoro-popliteal bypass graft ([place]) 05/12/2020   Critical lower limb ischemia ([place]) 12/11/2016   Claudication in peripheral vascular disease ([place]) 11/28/2016   HTN (hypertension) 10/09/2014   Tobacco use 10/09/2014   PAD (peripheral artery disease) ([place])     REFERRING DIAG: 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 He feels like his leg has to "toughen up"  PAIN:  Are you having pain?  Yes: NPRS scale: arrived to PT 2/10 Pain location: distal residual limb Pain description: throbbing & aching Aggravating factors: when he removes is onset of pain which is worst, then throbbing Relieving factors: wearing prosthesis  OBJECTIVE:    POSTURE: 05/19/21:  rounded shoulders, forward head, flexed trunk , and weight shift right   LE ROM:   ROM P:passive  A:active  Right 05/19/2021 Left 05/19/2021  Hip flexion      Hip extension   -15* standing  Hip abduction      Hip adduction      Hip internal rotation      Hip external rotation      Knee flexion      Knee extension      Ankle dorsiflexion      Ankle plantarflexion      Ankle inversion      Ankle eversion       (Blank rows = not tested) MMT:   MMT Right 05/19/2021 Left 05/19/2021  Hip flexion      Hip extension   3/5 Gross functional  Hip abduction   3/5 Gross functional  Hip adduction      Hip internal rotation      Hip external rotation      Knee flexion      Knee extension      Ankle dorsiflexion      Ankle plantarflexion      Ankle inversion      Ankle eversion      (Blank rows = not tested)   TRANSFERS: Sit to stand: 05/19/21:  SBA and requires UE assist with armrests & RW touch to stabilize Stand  to sit: 05/19/21:  SBA and requires UE assist with armrests & RW stabilization   GAIT: 05/19/21:  Gait pattern: step to pattern, decreased step length- Right, decreased stance time- Left, decreased hip/knee flexion- Left, circumduction- Left, Left hip hike, antalgic, trunk flexed, abducted- Left, and poor foot clearance- Left  Distance walked: 80' Assistive device utilized: Environmental consultant - 2 wheeled and TFA prosthesis  excessive UE weight bearing Level of assistance: SBA   FUNCTIONAL TESTs:  Berg Balance Scale: 05/19/21:  17/56   [facility] PT Assessment - 05/19/21 0900                Standardized Balance Assessment    Standardized Balance Assessment Berg Balance Test          Berg Balance Test    Sit to Stand Needs minimal aid to stand or to stabilize     Standing Unsupported Able to stand 2 minutes with supervision     Sitting with Back Unsupported but Feet Supported on Floor or Stool Able to sit safely and securely 2 minutes     Stand to Sit Controls descent by using hands     Transfers Able to transfer safely, definite need of hands     Standing Unsupported with Eyes Closed Unable  to keep eyes closed 3 seconds but stays steady     Standing Unsupported with Feet Together Needs help to attain position but able to stand for 30 seconds with feet together     From Standing, Reach Forward with Outstretched Arm Reaches forward but needs supervision     From Standing Position, Pick up Object from Floor Unable to try/needs assist to keep balance     From Standing Position, Turn to Look Behind Over each Shoulder Needs assist to keep from losing balance and falling     Turn 360 Degrees Needs assistance while turning     Standing Unsupported, Alternately Place Feet on Step/Stool Needs assistance to keep from falling or unable to try     Standing Unsupported, One Foot in [employer] balance while stepping or standing     Standing on One Leg Unable to try or needs assist to prevent fall     Total Score 17     Berg comment: BERG  < 36 high risk for falls (close to 100%) 46-51 moderate (>50%)   37-45 significant (>80%) 52-55 lower (> 25%)                   CURRENT PROSTHETIC WEAR ASSESSMENT: 05/19/21:  Patient is dependent with: skin check, residual limb care, care of non-amputated limb, prosthetic cleaning, ply sock cleaning, correct ply sock adjustment, proper wear schedule/adjustment, and proper weight-bearing schedule/adjustment Donning prosthesis: SBA Doffing prosthesis: Modified independence Prosthetic wear tolerance: 5-6 hours/day, 16 of 16 days since delivery.  Prosthetic weight bearing tolerance: 5 minutes with partial weight on prosthesis & intermittent support with RW Residual limb condition: 2 invaginated areas with one not clean, 1 small area on incision that may be suture working way out, dry skin, cylinderical shape, nonpitting edema,  Prosthetic description: silicon liner with velcro lanyard, ischial containment socket with flexible inner liner, SAFETY knee (single axis nonhydraulic), K2 flexible keel foot     TODAY'S TREATMENT:  06/23/2021 Prosthetic Training with  Transfemoral Amputation Pt ambulated >200' in/out of building with RW safely, I did add tennis balls to his RW.  Pt navigated 6 inch step ups X10 with Rt leg and down in front leading with left leg  in // bars with supervision with verbal cues for technique Pt ambulated 20'  X3 with cane stand alone tip & HHA modA to [place] with cues on sequence, upright posture & wt shift over prosthesis in stance.  Neuromuscular Re-ed: Standing on foam pad with RLE intermittent touch on //bars & minA tactile cues from PT: eyes open head turns 10 reps ea 4 directions and static eyes closed 10 sec 5 reps. sidestepping 3 round trips in bars   Therex Leg press DL 75# 3X15, then Rt leg only 50# 2X15 Sit to stands with UE support X 10 throughout session  06/21/2021 Prosthetic Training with Transfemoral Amputation PT reviewed donning with tightening suspension strap.  When his limb seats deeper into socket & strap is not tightened, then his limb pistons in socket and can be part of distal limb pain. PT instructed in adjusting ply socks. Too many can make him "hammock" inside socket causing distal limb pain. He verbalized understanding.  PT instructed in picking up items off floor maintaining prosthetic knee extended & flexing rt knee. He requires UE support on RW with verbal cues and without UE support minA to reach within 6" of floor.  Pt ambulated 80' X 2 with stand alone tip cane with modA HHA. Verbal & tactile cues on initial contact as soon as foot swings forward, wt shift over prosthesis in stance and upright posture.  Worked on sit to/from stand from chairs without armrests touching cane stand alone tip with cues on technique.  5 reps which he improved with ea rep & PT instruction.   06/16/2021 Prosthetic Training with Transfemoral Amputation PT reminded pt on tightening suspension strap upon standing Pt ambulated >200' in/out of building with RW safely.  Pt neg 6.5 inch curb with RW safely with supervision  and ramp with verbal cues for technique descending.  Pt ambulated 87'  X3 with cane stand alone tip & HHA modA to [place] with cues on sequence, upright posture & wt shift over prosthesis in stance.  Neuromuscular Re-ed: Standing on foam pad with prosthetic foot back ~2" to RLE intermittent touch on //bars & minA tactile cues from PT: eyes open head turns 10 reps ea 4 directions and static eyes closed 10 sec 3 reps. Pt backwards gait & sidestepping LUE //bar & RUE cane 8' X 2 with supervision & verbal cues.   Therex Leg press DL 75# X 20 Sit to stands with UE support X 10 throughout session  06/14/2021 Self-care:  PT instructed in tapping prosthetic socket for vibrations and demo, verbal cues on mirror therapy to manage phantom pain.  PT reviewed medications with MD orders for taking meds for phantom pain.  Pt verbalized understanding of all of above.   Prosthetic Training with Transfemoral Amputation PT reminded pt on tightening suspension strap and folding socks over top of socket to decrease bunching up in socket. Pt verbalized understanding.  Pt ambulated >200' in/out of building with RW safely.  Pt neg curb with RW safely with supervision and ramp with verbal cues for technique descending.  Pt ambulated 69' & 130' with cane stand alone tip & HHA modA to HHA minA with cues on sequence, upright posture & wt shift over prosthesis in stance. Pt neg 4 steps rt rail/ lt cane and 4 steps left rail/ rt cane with minA guard & verbal cues.   Neuromuscular Re-ed: Pt able to reach 7" and look over shoulders without UE support with supervision. Standing on foam pad with  prosthetic foot back ~2" to RLE intermittent touch on //bars & minA tactile cues from PT: eyes open head turns 10 reps ea 4 directions and static eyes closed 10 sec 3 reps. Pt backwards gait & sidestepping LUE //bar & RUE cane 8' X 2 with supervision & verbal cues.     05/31/21 1426  PT Education  Education Details HEP for  posture & hip flexor stretch  Access Code: [email or web address]  Person(s) Educated Patient;Spouse  Methods Explanation;Demonstration;Tactile cues;Verbal cues;Handout  Comprehension Verbalized understanding;Returned demonstration;Tactile cues required;Verbal cues required;Need further instruction   Access Code: [email or web address] URL: [email or web address] Date: 05/31/2021 Prepared by: Jamey Reas  Exercises - Seated Hamstring Stretch with Strap  - 2 x daily - 7 x weekly - 1 sets - 2-3 reps - 30 seconds hold - Modified [place] Stretch  - 2 x daily - 7 x weekly - 1 sets - 2 reps - 30 seconds hold - Supine Dynamic Modified Kupfer Quad and Hip Flexor Dynamic Stretch  - 2 x daily - 7 x weekly - 1 sets - 2 reps - 30 seconds hold - Standing posture with back to counter  - 2 x daily - 7 x weekly - 1 sets - 1 reps - 5-10 minutes hold - Upright Stance at Door Frame Single Arm  - 3-6 x daily - 7 x weekly - 1 sets - 2 reps - 2 deep breathes hold - Upright Stance at Door Frame with Both Arms  - 3-6 x daily - 7 x weekly - 1 sets - 2 reps - 2 deep breathes hold   05/24/21 1151  PT Education  Education Details HEP at sink & residual limb pain vs phantom sensation vs phantom pain  Person(s) Educated Patient  Methods Explanation;Demonstration;Tactile cues;Verbal cues;Handout  Comprehension Verbalized understanding;Returned demonstration;Verbal cues required;Tactile cues required;Need further instruction   PROSTHETIC TRAINING: Wear time Continue wear 2x/day for 4hours.  Limb condition denies issues Weight bearing tolerated standing for 15 min without c/o limb pain.   PATIENT EDUCATION: Education details: sit to/from stand controlling prosthesis without armrests, Proper donning tightening suspension strap and Proper wear schedule/adjustment Person educated: Patient and Spouse Education method: Explanation, Demonstration, Tactile cues, Verbal cues, and Handouts Education comprehension: verbalized  understanding, verbal cues required, and needs further education     ASSESSMENT:   CLINICAL IMPRESSION: He is doing well with prosthetic gait with RW but lacks balance, strength, and confidence with SPC still requiring HHA on other side for now. We will continue to work to improve this as tolerated.    OBJECTIVE IMPAIRMENTS Abnormal gait, decreased activity tolerance, decreased balance, decreased endurance, decreased knowledge of condition, decreased knowledge of use of DME, decreased mobility, decreased ROM, decreased strength, impaired flexibility, postural dysfunction, prosthetic dependency , and pain.    ACTIVITY LIMITATIONS community activity and occupation.    PERSONAL FACTORS Fitness, Time since onset of injury/illness/exacerbation, and 3+ comorbidities: see PMH  are also affecting patient's functional outcome.    REHAB POTENTIAL: Good   CLINICAL DECISION MAKING: Evolving/moderate complexity   EVALUATION COMPLEXITY: Moderate     GOALS: Goals reviewed with patient? Yes   SHORT TERM GOALS: Target date: 06/16/2021   Patient donnes prosthesis modified independent & verbalizes proper cleaning Goal status: MET 06/14/2021 2.  Patient tolerates prosthesis >12 hrs total /day without skin issues or limb pain <3/10 after standing. Goal status: MET 06/14/2021   3.  Patient able to reach 7" and look over both shoulders without UE support with supervision. Goal status: MET  06/14/2021   4. Patient ambulates 200' with RW & prosthesis with supervision / verbal cues only. Goal status: MET 06/14/2021   5. Patient negotiates ramps & curbs with RW & prosthesis with supervision. Goal status: MET 06/14/2021   SHORT TERM GOALS: Target date: 07/14/2021   Patient verbalized proper adjustment of ply socks. Goal status: ongoing 2.  Patient tolerates prosthesis >90% awake hrs /day without skin issues or limb pain </= 1/10 after standing / walking. Goal status: ongoing   3.  Patient able to pick up  items from floor without UE support with supervision. Goal status: ongoing   4. Patient ambulates 100' with cane stand alone tip & prosthesis with min guard. Goal status: ongoing  LONG TERM GOALS: Target date: 08/17/2021   Patient demonstrates & verbalized understanding of prosthetic care to enable safe utilization of prosthesis. Baseline: SEE OBJECTIVE DATA Goal status: INITIAL   Patient tolerates prosthesis wear >90% of awake hours without skin or limb pain issues. Baseline: SEE OBJECTIVE DATA Goal status: INITIAL   Berg Balance >/= 30/56 to indicate lower fall risk Baseline: SEE OBJECTIVE DATA Goal status: INITIAL   Patient ambulates >500' with LRAD & prosthesis modified independent. Baseline: SEE OBJECTIVE DATA Goal status: INITIAL   Patient negotiates ramps, curbs & stairs with single rail with LRAD & prosthesis modified independent. Baseline: SEE OBJECTIVE DATA Goal status: INITIAL     PLAN: PT FREQUENCY: 2x/week   PT DURATION: 12 weeks   PLANNED INTERVENTIONS: Therapeutic exercises, Therapeutic activity, Neuromuscular re-education, Balance training, Gait training, Patient/Family education, Stair training, Vestibular training, Prosthetic training, and DME instructions   PLAN FOR NEXT SESSION: continue prosthetic gait with cane stand alone tip. Therapeutic exercise & balance activities.      Debbe Odea, PT, DPT 06/23/2021, 3:10 PM

## 2021-06-23 NOTE — Therapy (Incomplete)
OUTPATIENT PHYSICAL THERAPY PROSTHETIC TREATMENT NOTE   Patient Name: Ryan [name] MRN: [id number] DOB:1950-09-27, 71 y.o., male Today's Date: 06/23/2021  PCP: Mirna Mires, MD REFERRING PROVIDER: Graceann Congress, PA-C              Past Medical History:  Diagnosis Date   COVID 2022   Hypertension    PVD (peripheral vascular disease) [facility])    Past Surgical History:  Procedure Laterality Date   ABDOMINAL AORTAGRAM Left 12/13/2016   ABDOMINAL AORTOGRAM W/LOWER EXTREMITY/notes 12/13/2016   ABDOMINAL AORTOGRAM W/LOWER EXTREMITY N/A 11/28/2016   Procedure: ABDOMINAL AORTOGRAM W/LOWER EXTREMITY;  Surgeon: Yates Decamp, MD;  Location: [facility] INVASIVE CV LAB;  Service: Cardiovascular;  Laterality: N/A;  bilateral   ABDOMINAL AORTOGRAM W/LOWER EXTREMITY N/A 12/13/2016   Procedure: ABDOMINAL AORTOGRAM W/LOWER EXTREMITY;  Surgeon: Maeola Harman, MD;  Location: [facility] INVASIVE CV LAB;  Service: Cardiovascular;  Laterality: N/A;  unilater left leg   ABDOMINAL AORTOGRAM W/LOWER EXTREMITY N/A 01/09/2018   Procedure: ABDOMINAL AORTOGRAM W/LOWER EXTREMITY;  Surgeon: Cephus Shelling, MD;  Location: [facility];  Service: Cardiovascular;  Laterality: N/A;   ABDOMINAL AORTOGRAM W/LOWER EXTREMITY Left 09/22/2019   Procedure: ABDOMINAL AORTOGRAM W/LOWER EXTREMITY;  Surgeon: Maeola Harman, MD;  Location: [facility] INVASIVE CV LAB;  Service: Cardiovascular;  Laterality: Left;   ABDOMINAL AORTOGRAM W/LOWER EXTREMITY Left 12/02/2020   Procedure: ABDOMINAL AORTOGRAM W/LOWER EXTREMITY;  Surgeon: Cephus Shelling, MD;  Location: [facility];  Service: Cardiovascular;  Laterality: Left;   ABDOMINAL AORTOGRAM W/LOWER EXTREMITY N/A 12/22/2020   Procedure: ABDOMINAL AORTOGRAM W/LOWER EXTREMITY;  Surgeon: Victorino Sparrow, MD;  Location: [facility] INVASIVE CV LAB;  Service: Cardiovascular;  Laterality: N/A;   AMPUTATION Left 02/11/2021   Procedure: LEFT ABOVE KNEE AMPUTATION;  Surgeon:  Cephus Shelling, MD;  Location: [facility] OR;  Service: Vascular;  Laterality: Left;   BYPASS GRAFT POPLITEAL TO TIBIAL Left 12/31/2020   Procedure: LEFT JUMP GRAFT REVISION BELOW KNEE POPLITEAL TO PERONEAL;  Surgeon: Cephus Shelling, MD;  Location: [facility] OR;  Service: Vascular;  Laterality: Left;   EMBOLIZATION Left 12/03/2020   Procedure: EMBOLIZATION;  Surgeon: Leonie Douglas, MD;  Location: [facility];  Service: Cardiovascular;  Laterality: Left;   FEMORAL-POPLITEAL BYPASS GRAFT Left 12/14/2016   Procedure: BYPASS GRAFT COMMON FEMORAL- BELOW KNEE POPLITEAL ARTERY with Bovine Patch to Below the knee poplitieal artery.;  Surgeon: Fransisco Hertz, MD;  Location: [facility] OR;  Service: Vascular;  Laterality: Left;   FEMORAL-POPLITEAL BYPASS GRAFT Left 01/28/2018   Procedure: BYPASS GRAFT FEMORAL-BELOW KNEE POPLITEAL ARTERY REDO with propaten vascular graft removable ring;  Surgeon: Cephus Shelling, MD;  Location: [facility] OR;  Service: Vascular;  Laterality: Left;   FEMUR SURGERY Right 2009   pt. has a rod in that leg   LOWER EXTREMITY ANGIOGRAPHY Left 11/28/2016   Procedure: Lower Extremity Angiography;  Surgeon: Yates Decamp, MD;  Location: [facility] INVASIVE CV LAB;  Service: Cardiovascular;  Laterality: Left;  lysis followup lower leg   PATCH ANGIOPLASTY Left 01/28/2018   Procedure: PATCH ANGIOPLASTY USING Livia Snellen BIOLOGIC PATCH;  Surgeon: Cephus Shelling, MD;  Location: [facility] OR;  Service: Vascular;  Laterality: Left;   PATCH ANGIOPLASTY Left 06/16/2020   Procedure: PATCH ANGIOPLASTY OF LEFT BELOW KNEE POPITEAL ARTERY;  Surgeon: Cephus Shelling, MD;  Location: [facility] OR;  Service: Vascular;  Laterality: Left;   PERIPHERAL VASCULAR BALLOON ANGIOPLASTY  11/28/2016   Procedure: PERIPHERAL VASCULAR BALLOON ANGIOPLASTY;  Surgeon: Yates Decamp, MD;  [facility]: [facility];  Service: Cardiovascular;;  SFA   PERIPHERAL VASCULAR BALLOON ANGIOPLASTY  12/03/2020   Procedure: PERIPHERAL VASCULAR BALLOON  ANGIOPLASTY;  Surgeon: Leonie Douglas, MD;  Location: [facility];  Service: Cardiovascular;;  TP Trunk and Peroneal   PERIPHERAL VASCULAR CATHETERIZATION N/A 08/11/2014   Procedure: Lower Extremity Angiography;  Surgeon: Yates Decamp, MD;  Location: [facility] INVASIVE CV LAB;  Service: Cardiovascular;  Laterality: N/A;   PERIPHERAL VASCULAR INTERVENTION Left 09/22/2019   Procedure: PERIPHERAL VASCULAR INTERVENTION;  Surgeon: Maeola Harman, MD;  Location: [facility] INVASIVE CV LAB;  Service: Cardiovascular;  Laterality: Left;   PERIPHERAL VASCULAR INTERVENTION Left 09/23/2019   Procedure: PERIPHERAL VASCULAR INTERVENTION;  Surgeon: Nada Libman, MD;  Location: [facility];  Service: Cardiovascular;  Laterality: Left;   PERIPHERAL VASCULAR INTERVENTION Left 05/13/2020   Procedure: PERIPHERAL VASCULAR INTERVENTION;  Surgeon: Cephus Shelling, MD;  Location: [facility];  Service: Cardiovascular;  Laterality: Left;   PERIPHERAL VASCULAR INTERVENTION Left 12/02/2020   Procedure: PERIPHERAL VASCULAR INTERVENTION;  Surgeon: Cephus Shelling, MD;  Location: [facility];  Service: Cardiovascular;  Laterality: Left;   PERIPHERAL VASCULAR INTERVENTION  12/03/2020   Procedure: PERIPHERAL VASCULAR INTERVENTION;  Surgeon: Leonie Douglas, MD;  [facility];  Service: Cardiovascular;;   PERIPHERAL VASCULAR INTERVENTION Left 12/22/2020   Procedure: PERIPHERAL VASCULAR INTERVENTION;  Surgeon: Victorino Sparrow, MD;  Location: [facility] INVASIVE CV LAB;  Service: Cardiovascular;  Laterality: Left;   PERIPHERAL VASCULAR THROMBECTOMY  11/28/2016   Procedure: PERIPHERAL VASCULAR THROMBECTOMY;  Surgeon: Yates Decamp, MD;  [facility];  Service: Cardiovascular;;  Profunda   PERIPHERAL VASCULAR THROMBECTOMY  11/28/2016   Procedure: PERIPHERAL VASCULAR THROMBECTOMY;  Surgeon: Yates Decamp, MD;  Location: [facility];  Service: Cardiovascular;;   PERIPHERAL  VASCULAR THROMBECTOMY N/A 05/12/2020   Procedure: PERIPHERAL VASCULAR THROMBECTOMY-Left Leg;  Surgeon: Leonie Douglas, MD;  [facility];  Service: Cardiovascular;  Laterality: N/A;   PERIPHERAL VASCULAR THROMBECTOMY N/A 05/13/2020   Procedure: PERIPHERAL VASCULAR THROMBECTOMY- LYSIS RECHECK;  Surgeon: Cephus Shelling, MD;  Location: [facility];  Service: Cardiovascular;  Laterality: N/A;   PERIPHERAL VASCULAR THROMBECTOMY N/A 12/02/2020   Procedure: PERIPHERAL VASCULAR THROMBECTOMY;  Surgeon: Cephus Shelling, MD;  Location: [facility];  Service: Cardiovascular;  Laterality: N/A;   PERIPHERAL VASCULAR THROMBECTOMY N/A 12/03/2020   Procedure: LYSIS RECHECK;  Surgeon: Leonie Douglas, MD;  [facility]: [facility];  Service: Cardiovascular;  Laterality: N/A;   PULMONARY THROMBECTOMY N/A 09/23/2019   Procedure: LYSIS RECHECK;  Surgeon: Nada Libman, MD;  Location: [facility];  Service: Cardiovascular;  Laterality: N/A;   THROMBECTOMY FEMORAL ARTERY Left 12/14/2016   Procedure: THROMBECTOMY PROFUNDA FEMORAL ARTERY;  Surgeon: Fransisco Hertz, MD;  Location: [facility] OR;  Service: Vascular;  Laterality: Left;   THROMBECTOMY FEMORAL ARTERY Left 06/16/2020   Procedure: Redo exposure ot Left below knee popiteal artery;  Surgeon: Cephus Shelling, MD;  Location: [facility] OR;  Service: Vascular;  Laterality: Left;   THROMBECTOMY OF BYPASS GRAFT FEMORAL- POPLITEAL ARTERY Left 06/16/2020   Procedure: THROMBECTOMY OF BYPASS GRAFT FEMORAL-POPLITEAL ARTERY and TIBIAL ARTERY;  Surgeon: Cephus Shelling, MD;  Location: [facility] OR;  Service: Vascular;  Laterality: Left;   THROMBECTOMY OF BYPASS GRAFT FEMORAL- POPLITEAL ARTERY Left 12/31/2020   Procedure: THROMBECTOMY OF LEFT COMMON FEMORAL TO BELOW KNEE POPLITEAL ARTERY BYPASS GRAFT;  Surgeon: Cephus Shelling, MD;  Location: [facility] OR;  Service: Vascular;  Laterality: Left;   VEIN HARVEST Left 12/31/2020   Procedure: Left  Greater Saphenous Vein Harvest;  Surgeon: Cephus Shelling, MD;  Location: [facility] OR;  Service: Vascular;  Laterality: Left;   Patient Active Problem List   Diagnosis Date Noted   Peripheral artery disease (HCC) 12/31/2020   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   Thrombosis of femoro-popliteal bypass graft (HCC) 05/12/2020   Critical lower limb ischemia (HCC) 12/11/2016   Claudication in peripheral vascular disease (HCC) 11/28/2016   HTN (hypertension) 10/09/2014   Tobacco use 10/09/2014   PAD (peripheral artery disease) (HCC)     REFERRING DIAG: 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  PERTINENT HISTORY: left TFA, HTN, PVD, Covid, ORIF left femur 2009  PRECAUTIONS: Fall  SUBJECTIVE: *** PAIN:  Are you having pain?  Yes: NPRS scale: arrived to PT *** 2/10 Pain location: distal residual limb Pain description: throbbing & aching Aggravating factors: when he removes is onset of pain which is worst, then throbbing Relieving factors: wearing prosthesis  OBJECTIVE:    POSTURE: 05/19/21:  rounded shoulders, forward head, flexed trunk , and weight shift right   LE ROM:   ROM P:passive  A:active Right 05/19/2021 Left 05/19/2021  Hip flexion      Hip extension   -15* standing  Hip abduction      Hip adduction      Hip internal rotation      Hip external rotation      Knee flexion      Knee extension      Ankle dorsiflexion      Ankle plantarflexion      Ankle inversion      Ankle eversion       (Blank rows = not tested) MMT:   MMT Right 05/19/2021 Left 05/19/2021  Hip flexion      Hip extension   3/5 Gross functional  Hip abduction   3/5 Gross functional  Hip adduction      Hip internal rotation      Hip external rotation      Knee flexion      Knee extension      Ankle dorsiflexion      Ankle plantarflexion      Ankle inversion      Ankle eversion       (Blank rows = not tested)   TRANSFERS: Sit to stand: 05/19/21:  SBA and requires UE assist with armrests & RW touch to stabilize Stand to sit: 05/19/21:  SBA and requires UE assist with armrests & RW stabilization   GAIT: 05/19/21:  Gait pattern: step to pattern, decreased step length- Right, decreased stance time- Left, decreased hip/knee flexion- Left, circumduction- Left, Left hip hike, antalgic, trunk flexed, abducted- Left, and poor foot clearance- Left  Distance walked: 52' Assistive device utilized: Environmental consultant - 2 wheeled and TFA prosthesis  excessive UE weight bearing Level of assistance: SBA   FUNCTIONAL TESTs:  Berg Balance Scale: 05/19/21:  17/56   [facility] PT Assessment - 05/19/21 0900                Standardized Balance Assessment    Standardized Balance Assessment Berg Balance Test          Berg Balance Test    Sit to Stand Needs minimal aid to stand or to stabilize  Standing Unsupported Able to stand 2 minutes with supervision     Sitting with Back Unsupported but Feet Supported on Floor or Stool Able to sit safely and securely 2 minutes     Stand to Sit Controls descent by using hands     Transfers Able to transfer safely, definite need of hands     Standing Unsupported with Eyes Closed Unable to keep eyes closed 3 seconds but stays steady     Standing Unsupported with Feet Together Needs help to attain position but able to stand for 30 seconds with feet together     From Standing, Reach Forward with Outstretched Arm Reaches forward but needs supervision     From Standing Position, Pick up Object from Floor Unable to try/needs assist to keep balance     From Standing Position, Turn to Look Behind Over each Shoulder Needs assist to keep from losing balance and falling     Turn 360 Degrees Needs assistance while turning     Standing Unsupported, Alternately Place Feet on Step/Stool Needs assistance to keep from falling or unable to try     Standing Unsupported, One Foot in  [employer] balance while stepping or standing     Standing on One Leg Unable to try or needs assist to prevent fall     Total Score 17     Berg comment: BERG  < 36 high risk for falls (close to 100%) 46-51 moderate (>50%)   37-45 significant (>80%) 52-55 lower (> 25%)                   CURRENT PROSTHETIC WEAR ASSESSMENT: 05/19/21:  Patient is dependent with: skin check, residual limb care, care of non-amputated limb, prosthetic cleaning, ply sock cleaning, correct ply sock adjustment, proper wear schedule/adjustment, and proper weight-bearing schedule/adjustment Donning prosthesis: SBA Doffing prosthesis: Modified independence Prosthetic wear tolerance: 5-6 hours/day, 16 of 16 days since delivery.  Prosthetic weight bearing tolerance: 5 minutes with partial weight on prosthesis & intermittent support with RW Residual limb condition: 2 invaginated areas with one not clean, 1 small area on incision that may be suture working way out, dry skin, cylinderical shape, nonpitting edema,  Prosthetic description: silicon liner with velcro lanyard, ischial containment socket with flexible inner liner, SAFETY knee (single axis nonhydraulic), K2 flexible keel foot     TODAY'S TREATMENT:  06/23/2021 ***  06/21/2021 Prosthetic Training with Transfemoral Amputation PT reviewed donning with tightening suspension strap.  When his limb seats deeper into socket & strap is not tightened, then his limb pistons in socket and can be part of distal limb pain. PT instructed in adjusting ply socks. Too many can make him "hammock" inside socket causing distal limb pain. He verbalized understanding.  PT instructed in picking up items off floor maintaining prosthetic knee extended & flexing rt knee. He requires UE support on RW with verbal cues and without UE support minA to reach within 6" of floor.  Pt ambulated 80' X 2 with stand alone tip cane with modA HHA. Verbal & tactile cues on initial contact as soon as foot  swings forward, wt shift over prosthesis in stance and upright posture.  Worked on sit to/from stand from chairs without armrests touching cane stand alone tip with cues on technique.  5 reps which he improved with ea rep & PT instruction.   06/16/2021 Prosthetic Training with Transfemoral Amputation PT reminded pt on tightening suspension strap upon standing Pt ambulated >200' in/out of building with  RW safely.  Pt neg 6.5 inch curb with RW safely with supervision and ramp with verbal cues for technique descending.  Pt ambulated 58'  X3 with cane stand alone tip & HHA modA to HHA minA with cues on sequence, upright posture & wt shift over prosthesis in stance.  Neuromuscular Re-ed: Standing on foam pad with prosthetic foot back ~2" to RLE intermittent touch on //bars & minA tactile cues from PT: eyes open head turns 10 reps ea 4 directions and static eyes closed 10 sec 3 reps. Pt backwards gait & sidestepping LUE //bar & RUE cane 8' X 2 with supervision & verbal cues.   Therex Leg press DL 27# X 20 Sit to stands with UE support X 10 throughout session     05/31/21 1426  PT Education  Education Details HEP for posture & hip flexor stretch  Access Code: [email or web address]  Person(s) Educated Patient;Spouse  Methods Explanation;Demonstration;Tactile cues;Verbal cues;Handout  Comprehension Verbalized understanding;Returned demonstration;Tactile cues required;Verbal cues required;Need further instruction   Access Code: [email or web address] URL: [email or web address] Date: 05/31/2021 Prepared by: Vladimir Faster  Exercises - Seated Hamstring Stretch with Strap  - 2 x daily - 7 x weekly - 1 sets - 2-3 reps - 30 seconds hold - Modified Laurie Stretch  - 2 x daily - 7 x weekly - 1 sets - 2 reps - 30 seconds hold - Supine Dynamic Modified [place] Quad and Hip Flexor Dynamic Stretch  - 2 x daily - 7 x weekly - 1 sets - 2 reps - 30 seconds hold - Standing posture with back to counter  - 2 x daily - 7 x  weekly - 1 sets - 1 reps - 5-10 minutes hold - Upright Stance at Door Frame Single Arm  - 3-6 x daily - 7 x weekly - 1 sets - 2 reps - 2 deep breathes hold - Upright Stance at Door Frame with Both Arms  - 3-6 x daily - 7 x weekly - 1 sets - 2 reps - 2 deep breathes hold   05/24/21 1151  PT Education  Education Details HEP at sink & residual limb pain vs phantom sensation vs phantom pain  Person(s) Educated Patient  Methods Explanation;Demonstration;Tactile cues;Verbal cues;Handout  Comprehension Verbalized understanding;Returned demonstration;Verbal cues required;Tactile cues required;Need further instruction   PROSTHETIC TRAINING: Wear time Continue wear 2x/day for 4hours.  Limb condition denies issues Weight bearing tolerated standing for 15 min without c/o limb pain.   PATIENT EDUCATION: Education details: sit to/from stand controlling prosthesis without armrests, Proper donning tightening suspension strap and Proper wear schedule/adjustment Person educated: Patient and Spouse Education method: Explanation, Demonstration, Tactile cues, Verbal cues, and Handouts Education comprehension: verbalized understanding, verbal cues required, and needs further education     ASSESSMENT:   CLINICAL IMPRESSION: ***    OBJECTIVE IMPAIRMENTS Abnormal gait, decreased activity tolerance, decreased balance, decreased endurance, decreased knowledge of condition, decreased knowledge of use of DME, decreased mobility, decreased ROM, decreased strength, impaired flexibility, postural dysfunction, prosthetic dependency , and pain.    ACTIVITY LIMITATIONS community activity and occupation.    PERSONAL FACTORS Fitness, Time since onset of injury/illness/exacerbation, and 3+ comorbidities: see PMH  are also affecting patient's functional outcome.    REHAB POTENTIAL: Good   CLINICAL DECISION MAKING: Evolving/moderate complexity   EVALUATION COMPLEXITY: Moderate     GOALS: Goals reviewed with  patient? Yes   SHORT TERM GOALS: Target date: 07/14/2021   Patient verbalized proper adjustment of ply socks. Goal status: ongoing 2.  Patient  tolerates prosthesis >90% awake hrs /day without skin issues or limb pain </= 1/10 after standing / walking. Goal status: ongoing   3.  Patient able to pick up items from floor without UE support with supervision. Goal status: ongoing   4. Patient ambulates 100' with cane stand alone tip & prosthesis with min guard. Goal status: ongoing  LONG TERM GOALS: Target date: 08/17/2021   Patient demonstrates & verbalized understanding of prosthetic care to enable safe utilization of prosthesis. Baseline: SEE OBJECTIVE DATA Goal status: INITIAL   Patient tolerates prosthesis wear >90% of awake hours without skin or limb pain issues. Baseline: SEE OBJECTIVE DATA Goal status: INITIAL   Berg Balance >/= 30/56 to indicate lower fall risk Baseline: SEE OBJECTIVE DATA Goal status: INITIAL   Patient ambulates >500' with LRAD & prosthesis modified independent. Baseline: SEE OBJECTIVE DATA Goal status: INITIAL   Patient negotiates ramps, curbs & stairs with single rail with LRAD & prosthesis modified independent. Baseline: SEE OBJECTIVE DATA Goal status: INITIAL     PLAN: PT FREQUENCY: 2x/week   PT DURATION: 12 weeks   PLANNED INTERVENTIONS: Therapeutic exercises, Therapeutic activity, Neuromuscular re-education, Balance training, Gait training, Patient/Family education, Stair training, Vestibular training, Prosthetic training, and DME instructions   PLAN FOR NEXT SESSION: *** continue prosthetic gait with cane stand alone tip. Therapeutic exercise & balance activities.      Vladimir Fasterobin Rivaldo Hineman, PT, DPT 06/23/2021, 7:33 AM

## 2021-06-28 ENCOUNTER — Encounter: Payer: Self-pay | Admitting: Physical Therapy

## 2021-06-28 ENCOUNTER — Ambulatory Visit (INDEPENDENT_AMBULATORY_CARE_PROVIDER_SITE_OTHER): Payer: Medicare Other | Admitting: Physical Therapy

## 2021-06-28 DIAGNOSIS — M6281 Muscle weakness (generalized): Secondary | ICD-10-CM | POA: Diagnosis not present

## 2021-06-28 DIAGNOSIS — R293 Abnormal posture: Secondary | ICD-10-CM

## 2021-06-28 DIAGNOSIS — R2689 Other abnormalities of gait and mobility: Secondary | ICD-10-CM

## 2021-06-28 DIAGNOSIS — R2681 Unsteadiness on feet: Secondary | ICD-10-CM | POA: Diagnosis not present

## 2021-06-28 NOTE — Therapy (Signed)
OUTPATIENT PHYSICAL THERAPY PROSTHETIC TREATMENT NOTE   Patient Name: Ryan [name] MRN: [id number] DOB:01-Apr-1950, 71 y.o., male Today's Date: 06/28/2021  PCP: Iona Beard, MD REFERRING PROVIDER: Karoline Caldwell, PA-C       PT End of Session - 06/28/21 0839     Visit Number 12    Number of Visits 25    Date for PT Re-Evaluation 08/17/21    Authorization Type Medicare A/B & Generic commercial    Progress Note Due on Visit 10    PT Start Time 0840    PT Stop Time 0923    PT Time Calculation (min) 43 min    Equipment Utilized During Treatment Gait belt    Activity Tolerance Patient tolerated treatment well    Behavior During Therapy [facility] for tasks assessed/performed                     Past Medical History:  Diagnosis Date   COVID 2022   Hypertension    PVD (peripheral vascular disease) ([place])    Past Surgical History:  Procedure Laterality Date   ABDOMINAL AORTAGRAM Left 12/13/2016   ABDOMINAL AORTOGRAM W/LOWER EXTREMITY/notes 12/13/2016   ABDOMINAL AORTOGRAM W/LOWER EXTREMITY N/A 11/28/2016   Procedure: ABDOMINAL AORTOGRAM W/LOWER EXTREMITY;  Surgeon: Adrian Prows, MD;  Location: [place] [facility];  Service: Cardiovascular;  Laterality: N/A;  bilateral   ABDOMINAL AORTOGRAM W/LOWER EXTREMITY N/A 12/13/2016   Procedure: ABDOMINAL AORTOGRAM W/LOWER EXTREMITY;  Surgeon: Waynetta Sandy, MD;  Location: [place] [facility];  Service: Cardiovascular;  Laterality: N/A;  unilater left leg   ABDOMINAL AORTOGRAM W/LOWER EXTREMITY N/A 01/09/2018   Procedure: ABDOMINAL AORTOGRAM W/LOWER EXTREMITY;  Surgeon: Marty Heck, MD;  Location: [place] [facility];  Service: Cardiovascular;  Laterality: N/A;   ABDOMINAL AORTOGRAM W/LOWER EXTREMITY Left 09/22/2019   Procedure: ABDOMINAL AORTOGRAM W/LOWER EXTREMITY;  Surgeon: Waynetta Sandy, MD;  Location: [place] [facility];  Service: Cardiovascular;  Laterality: Left;   ABDOMINAL AORTOGRAM W/LOWER  EXTREMITY Left 12/02/2020   Procedure: ABDOMINAL AORTOGRAM W/LOWER EXTREMITY;  Surgeon: Marty Heck, MD;  Location: [place] [facility];  Service: Cardiovascular;  Laterality: Left;   ABDOMINAL AORTOGRAM W/LOWER EXTREMITY N/A 12/22/2020   Procedure: ABDOMINAL AORTOGRAM W/LOWER EXTREMITY;  Surgeon: Broadus John, MD;  Location: [place] [facility];  Service: Cardiovascular;  Laterality: N/A;   AMPUTATION Left 02/11/2021   Procedure: LEFT ABOVE KNEE AMPUTATION;  Surgeon: Marty Heck, MD;  Location: [place];  Service: Vascular;  Laterality: Left;   BYPASS GRAFT POPLITEAL TO TIBIAL Left 12/31/2020   Procedure: LEFT JUMP GRAFT REVISION BELOW KNEE POPLITEAL TO PERONEAL;  Surgeon: Marty Heck, MD;  Location: [place];  Service: Vascular;  Laterality: Left;   EMBOLIZATION Left 12/03/2020   Procedure: EMBOLIZATION;  Surgeon: Cherre Robins, MD;  Location: [place] [facility];  Service: Cardiovascular;  Laterality: Left;   FEMORAL-POPLITEAL BYPASS GRAFT Left 12/14/2016   Procedure: BYPASS GRAFT COMMON FEMORAL- BELOW KNEE POPLITEAL ARTERY with Bovine Patch to Below the knee poplitieal artery.;  Surgeon: Conrad Morrison, MD;  Location[facility];  Service: Vascular;  Laterality: Left;   FEMORAL-POPLITEAL BYPASS GRAFT Left 01/28/2018   Procedure: BYPASS GRAFT FEMORAL-BELOW KNEE POPLITEAL ARTERY REDO with propaten vascular graft removable ring;  Surgeon: Marty Heck, MD;  Location: [place];  Service: Vascular;  Laterality: Left;   FEMUR SURGERY Right 2009   pt. has a rod in that leg   LOWER EXTREMITY ANGIOGRAPHY Left 11/28/2016  Procedure: Lower Extremity Angiography;  Surgeon: Adrian Prows, MD;  Location: [place] [facility];  Service: Cardiovascular;  Laterality: Left;  lysis followup lower leg   PATCH ANGIOPLASTY Left 01/28/2018   Procedure: PATCH ANGIOPLASTY USING Rueben Bash BIOLOGIC PATCH;  Surgeon: Marty Heck, MD;  Location: [place];  Service: Vascular;  Laterality: Left;   PATCH  ANGIOPLASTY Left 06/16/2020   Procedure: PATCH ANGIOPLASTY OF LEFT BELOW KNEE [place] ARTERY;  Surgeon: Marty Heck, MD;  Location: [place];  Service: Vascular;  Laterality: Left;   PERIPHERAL VASCULAR BALLOON ANGIOPLASTY  11/28/2016   Procedure: PERIPHERAL VASCULAR BALLOON ANGIOPLASTY;  Surgeon: Adrian Prows, MD;  Location: [place] [facility];  Service: Cardiovascular;;  SFA   PERIPHERAL VASCULAR BALLOON ANGIOPLASTY  12/03/2020   Procedure: PERIPHERAL VASCULAR BALLOON ANGIOPLASTY;  Surgeon: Cherre Robins, MD;  Location: [place] [facility];  Service: Cardiovascular;;  TP Trunk and Peroneal   PERIPHERAL VASCULAR CATHETERIZATION N/A 08/11/2014   Procedure: Lower Extremity Angiography;  Surgeon: Adrian Prows, MD;  Location: [place] [facility];  Service: Cardiovascular;  Laterality: N/A;   PERIPHERAL VASCULAR INTERVENTION Left 09/22/2019   Procedure: PERIPHERAL VASCULAR INTERVENTION;  Surgeon: Waynetta Sandy, MD;  Location: [place] [facility];  Service: Cardiovascular;  Laterality: Left;   PERIPHERAL VASCULAR INTERVENTION Left 09/23/2019   Procedure: PERIPHERAL VASCULAR INTERVENTION;  Surgeon: Serafina Mitchell, MD;  Location: [place] [facility];  Service: Cardiovascular;  Laterality: Left;   PERIPHERAL VASCULAR INTERVENTION Left 05/13/2020   Procedure: PERIPHERAL VASCULAR INTERVENTION;  Surgeon: Marty Heck, MD;  Location: [place] [facility];  Service: Cardiovascular;  Laterality: Left;   PERIPHERAL VASCULAR INTERVENTION Left 12/02/2020   Procedure: PERIPHERAL VASCULAR INTERVENTION;  Surgeon: Marty Heck, MD;  Location: [place] [facility];  Service: Cardiovascular;  Laterality: Left;   PERIPHERAL VASCULAR INTERVENTION  12/03/2020   Procedure: PERIPHERAL VASCULAR INTERVENTION;  Surgeon: Cherre Robins, MD;  Location: [place] [facility];  Service: Cardiovascular;;   PERIPHERAL VASCULAR INTERVENTION Left 12/22/2020   Procedure: PERIPHERAL VASCULAR INTERVENTION;  Surgeon:  Broadus John, MD;  Location: [place] [facility];  Service: Cardiovascular;  Laterality: Left;   PERIPHERAL VASCULAR THROMBECTOMY  11/28/2016   Procedure: PERIPHERAL VASCULAR THROMBECTOMY;  Surgeon: Adrian Prows, MD;  Location: [place] [facility];  Service: Cardiovascular;;  Profunda   PERIPHERAL VASCULAR THROMBECTOMY  11/28/2016   Procedure: PERIPHERAL VASCULAR THROMBECTOMY;  Surgeon: Adrian Prows, MD;  Location: [place] [facility];  Service: Cardiovascular;;   PERIPHERAL VASCULAR THROMBECTOMY N/A 05/12/2020   Procedure: PERIPHERAL VASCULAR THROMBECTOMY-Left Leg;  Surgeon: Cherre Robins, MD;  Location: [place] [facility];  Service: Cardiovascular;  Laterality: N/A;   PERIPHERAL VASCULAR THROMBECTOMY N/A 05/13/2020   Procedure: PERIPHERAL VASCULAR THROMBECTOMY- LYSIS RECHECK;  Surgeon: Marty Heck, MD;  Location: [place] [facility];  Service: Cardiovascular;  Laterality: N/A;   PERIPHERAL VASCULAR THROMBECTOMY N/A 12/02/2020   Procedure: PERIPHERAL VASCULAR THROMBECTOMY;  Surgeon: Marty Heck, MD;  Location: [place] [facility];  Service: Cardiovascular;  Laterality: N/A;   PERIPHERAL VASCULAR THROMBECTOMY N/A 12/03/2020   Procedure: LYSIS RECHECK;  Surgeon: Cherre Robins, MD;  Location: [place] [facility];  Service: Cardiovascular;  Laterality: N/A;   PULMONARY THROMBECTOMY N/A 09/23/2019   Procedure: LYSIS RECHECK;  Surgeon: Serafina Mitchell, MD;  Location: [place] [facility];  Service: Cardiovascular;  Laterality: N/A;   THROMBECTOMY FEMORAL ARTERY Left 12/14/2016   Procedure: THROMBECTOMY PROFUNDA FEMORAL ARTERY;  Surgeon: Adele Barthel  L, MD;  Location: [place];  Service: Vascular;  Laterality: Left;   THROMBECTOMY FEMORAL ARTERY Left 06/16/2020   Procedure: Redo exposure ot Left below knee popiteal artery;  Surgeon: Marty Heck, MD;  Location: [place] [facility];  Service: Vascular;  Laterality: Left;   THROMBECTOMY OF BYPASS GRAFT FEMORAL- POPLITEAL ARTERY Left 06/16/2020    Procedure: THROMBECTOMY OF BYPASS GRAFT FEMORAL-POPLITEAL ARTERY and TIBIAL ARTERY;  Surgeon: Marty Heck, MD;  Location: [place];  Service: Vascular;  Laterality: Left;   THROMBECTOMY OF BYPASS GRAFT FEMORAL- POPLITEAL ARTERY Left 12/31/2020   Procedure: THROMBECTOMY OF LEFT COMMON FEMORAL TO BELOW KNEE POPLITEAL ARTERY BYPASS GRAFT;  Surgeon: Marty Heck, MD;  Location: [place];  Service: Vascular;  Laterality: Left;   VEIN HARVEST Left 12/31/2020   Procedure: Left Greater Saphenous Vein Harvest;  Surgeon: Marty Heck, MD;  Location: [place];  Service: Vascular;  Laterality: Left;   Patient Active Problem List   Diagnosis Date Noted   Peripheral artery disease ([place]) 12/31/2020   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[place]) 05/12/2020   Thrombosis of femoro-popliteal bypass graft ([place]) 05/12/2020   Critical lower limb ischemia ([place]) 12/11/2016   Claudication in peripheral vascular disease ([place]) 11/28/2016   HTN (hypertension) 10/09/2014   Tobacco use 10/09/2014   PAD (peripheral artery disease) ([place])     REFERRING DIAG: 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  Yes: NPRS scale: arrived to PT 2/10 Pain location: distal residual limb Pain description: throbbing & aching Aggravating factors: when he removes is onset of pain which is worst, then throbbing Relieving factors: wearing prosthesis  OBJECTIVE:    POSTURE: 05/19/21:  rounded shoulders, forward head, flexed trunk , and weight shift right   LE ROM:   ROM P:passive  A:active Right 05/19/2021 Left 05/19/2021  Hip flexion      Hip  extension   -15* standing  Hip abduction      Hip adduction      Hip internal rotation      Hip external rotation      Knee flexion      Knee extension      Ankle dorsiflexion      Ankle plantarflexion      Ankle inversion      Ankle eversion       (Blank rows = not tested) MMT:   MMT Right 05/19/2021 Left 05/19/2021  Hip flexion      Hip extension   3/5 Gross functional  Hip abduction   3/5 Gross functional  Hip adduction      Hip internal rotation      Hip external rotation      Knee flexion      Knee extension      Ankle dorsiflexion      Ankle plantarflexion      Ankle inversion      Ankle eversion      (Blank rows = not tested)   TRANSFERS: Sit to stand: 05/19/21:  SBA and requires UE assist with armrests & RW touch to stabilize Stand to sit: 05/19/21:  SBA and requires UE assist with armrests & RW  stabilization   GAIT: 05/19/21:  Gait pattern: step to pattern, decreased step length- Right, decreased stance time- Left, decreased hip/knee flexion- Left, circumduction- Left, Left hip hike, antalgic, trunk flexed, abducted- Left, and poor foot clearance- Left  Distance walked: 41' Assistive device utilized: Environmental consultant - 2 wheeled and TFA prosthesis  excessive UE weight bearing Level of assistance: SBA   FUNCTIONAL TESTs:  Berg Balance Scale: 05/19/21:  17/56   [facility] PT Assessment - 05/19/21 0900                Standardized Balance Assessment    Standardized Balance Assessment Berg Balance Test          Berg Balance Test    Sit to Stand Needs minimal aid to stand or to stabilize     Standing Unsupported Able to stand 2 minutes with supervision     Sitting with Back Unsupported but Feet Supported on Floor or Stool Able to sit safely and securely 2 minutes     Stand to Sit Controls descent by using hands     Transfers Able to transfer safely, definite need of hands     Standing Unsupported with Eyes Closed Unable to keep eyes closed 3 seconds but stays steady      Standing Unsupported with Feet Together Needs help to attain position but able to stand for 30 seconds with feet together     From Standing, Reach Forward with Outstretched Arm Reaches forward but needs supervision     From Standing Position, Pick up Object from Floor Unable to try/needs assist to keep balance     From Standing Position, Turn to Look Behind Over each Shoulder Needs assist to keep from losing balance and falling     Turn 360 Degrees Needs assistance while turning     Standing Unsupported, Alternately Place Feet on Step/Stool Needs assistance to keep from falling or unable to try     Standing Unsupported, One Foot in [employer] balance while stepping or standing     Standing on One Leg Unable to try or needs assist to prevent fall     Total Score 17     Berg comment: BERG  < 36 high risk for falls (close to 100%) 46-51 moderate (>50%)   37-45 significant (>80%) 52-55 lower (> 25%)                   CURRENT PROSTHETIC WEAR ASSESSMENT: 05/19/21:  Patient is dependent with: skin check, residual limb care, care of non-amputated limb, prosthetic cleaning, ply sock cleaning, correct ply sock adjustment, proper wear schedule/adjustment, and proper weight-bearing schedule/adjustment Donning prosthesis: SBA Doffing prosthesis: Modified independence Prosthetic wear tolerance: 5-6 hours/day, 16 of 16 days since delivery.  Prosthetic weight bearing tolerance: 5 minutes with partial weight on prosthesis & intermittent support with RW Residual limb condition: 2 invaginated areas with one not clean, 1 small area on incision that may be suture working way out, dry skin, cylinderical shape, nonpitting edema,  Prosthetic description: silicon liner with velcro lanyard, ischial containment socket with flexible inner liner, SAFETY knee (single axis nonhydraulic), K2 flexible keel foot     TODAY'S TREATMENT:  06/28/2021 Prosthetic Training with Transfemoral Amputation Doffed prosthetic leg and  examined his distal residual limb due to complaints of pain and no obvious redness or pressure points noted however when donning back on he may be too far down in socket so advised to try using more ply sock to see if this helps as  he arrives with only 1 ply sock. Pt navigated 6 inch step ups X8 with Rt leg and down in front leading with left leg in // bars with supervision with verbal cues for technique Pt ambulated 65'  X2 with cane stand alone tip & HHA modA to HHA minA with cues on sequence, upright posture & wt shift over prosthesis in stance.  Neuromuscular Re-ed: Standing on foam pad with RLE intermittent touch on //bars & minA tactile cues from PT: eyes open head turns 10 reps ea 4 directions and static eyes closed 10 sec 5 reps. sidestepping 3 round trips in bars   Therex Leg press DL 75# 3X15, then Rt leg only 50# 2X15 Sit to stands with UE support X 10 throughout session  06/23/2021 Prosthetic Training with Transfemoral Amputation Pt ambulated >200' in/out of building with RW safely, I did add tennis balls to his RW.  Pt navigated 6 inch step ups X10 with Rt leg and down in front leading with left leg in // bars with supervision with verbal cues for technique Pt ambulated 63'  X3 with cane stand alone tip & HHA modA to HHA minA with cues on sequence, upright posture & wt shift over prosthesis in stance.  Neuromuscular Re-ed: Standing on foam pad with RLE intermittent touch on //bars & minA tactile cues from PT: eyes open head turns 10 reps ea 4 directions and static eyes closed 10 sec 5 reps. sidestepping 3 round trips in bars   Therex Leg press DL 75# 3X15, then Rt leg only 50# 2X15 Sit to stands with UE support X 10 throughout session  06/21/2021 Prosthetic Training with Transfemoral Amputation PT reviewed donning with tightening suspension strap.  When his limb seats deeper into socket & strap is not tightened, then his limb pistons in socket and can be part of distal limb  pain. PT instructed in adjusting ply socks. Too many can make him "hammock" inside socket causing distal limb pain. He verbalized understanding.  PT instructed in picking up items off floor maintaining prosthetic knee extended & flexing rt knee. He requires UE support on RW with verbal cues and without UE support minA to reach within 6" of floor.  Pt ambulated 80' X 2 with stand alone tip cane with modA HHA. Verbal & tactile cues on initial contact as soon as foot swings forward, wt shift over prosthesis in stance and upright posture.  Worked on sit to/from stand from chairs without armrests touching cane stand alone tip with cues on technique.  5 reps which he improved with ea rep & PT instruction.   06/16/2021 Prosthetic Training with Transfemoral Amputation PT reminded pt on tightening suspension strap upon standing Pt ambulated >200' in/out of building with RW safely.  Pt neg 6.5 inch curb with RW safely with supervision and ramp with verbal cues for technique descending.  Pt ambulated 2'  X3 with cane stand alone tip & HHA modA to [place] with cues on sequence, upright posture & wt shift over prosthesis in stance.  Neuromuscular Re-ed: Standing on foam pad with prosthetic foot back ~2" to RLE intermittent touch on //bars & minA tactile cues from PT: eyes open head turns 10 reps ea 4 directions and static eyes closed 10 sec 3 reps. Pt backwards gait & sidestepping LUE //bar & RUE cane 8' X 2 with supervision & verbal cues.   Therex Leg press DL 75# X 20 Sit to stands with UE support X 10 throughout session  05/31/21 1426  PT Education  Education Details HEP for posture & hip flexor stretch  Access Code: [email or web address]  Person(s) Educated Patient;Spouse  Methods Explanation;Demonstration;Tactile cues;Verbal cues;Handout  Comprehension Verbalized understanding;Returned demonstration;Tactile cues required;Verbal cues required;Need further instruction   Access Code: [email or web address] URL:  [email or web address] Date: 05/31/2021 Prepared by: Jamey Reas  Exercises - Seated Hamstring Stretch with Strap  - 2 x daily - 7 x weekly - 1 sets - 2-3 reps - 30 seconds hold - Modified Mcclenathan Stretch  - 2 x daily - 7 x weekly - 1 sets - 2 reps - 30 seconds hold - Supine Dynamic Modified [place]ala Quad and Hip Flexor Dynamic Stretch  - 2 x daily - 7 x weekly - 1 sets - 2 reps - 30 seconds hold - Standing posture with back to counter  - 2 x daily - 7 x weekly - 1 sets - 1 reps - 5-10 minutes hold - Upright Stance at Door Frame Single Arm  - 3-6 x daily - 7 x weekly - 1 sets - 2 reps - 2 deep breathes hold - Upright Stance at Door Frame with Both Arms  - 3-6 x daily - 7 x weekly - 1 sets - 2 reps - 2 deep breathes hold   05/24/21 1151  PT Education  Education Details HEP at sink & residual limb pain vs phantom sensation vs phantom pain  Person(s) Educated Patient  Methods Explanation;Demonstration;Tactile cues;Verbal cues;Handout  Comprehension Verbalized understanding;Returned demonstration;Verbal cues required;Tactile cues required;Need further instruction   PROSTHETIC TRAINING: Wear time Continue wear 2x/day for 4hours.  Limb condition denies issues Weight bearing tolerated standing for 15 min without c/o limb pain.   PATIENT EDUCATION: Education details: sit to/from stand controlling prosthesis without armrests, Proper donning tightening suspension strap and Proper wear schedule/adjustment Person educated: Patient and Spouse Education method: Explanation, Demonstration, Tactile cues, Verbal cues, and Handouts Education comprehension: verbalized understanding, verbal cues required, and needs further education     ASSESSMENT:   CLINICAL IMPRESSION: Due to complaints of residual limb pain we doffed and re-donned his prosthesis and I could not observe any obvious pressure points or problems but he may need more ply sock potentially based on how he looked in standing. I  advised him to try going form 1 ply to 3 ply sock to see if this helps improve fit inside the socket to reduce any pain.   OBJECTIVE IMPAIRMENTS Abnormal gait, decreased activity tolerance, decreased balance, decreased endurance, decreased knowledge of condition, decreased knowledge of use of DME, decreased mobility, decreased ROM, decreased strength, impaired flexibility, postural dysfunction, prosthetic dependency , and pain.    ACTIVITY LIMITATIONS community activity and occupation.    PERSONAL FACTORS Fitness, Time since onset of injury/illness/exacerbation, and 3+ comorbidities: see PMH  are also affecting patient's functional outcome.    REHAB POTENTIAL: Good   CLINICAL DECISION MAKING: Evolving/moderate complexity   EVALUATION COMPLEXITY: Moderate     GOALS: Goals reviewed with patient? Yes   SHORT TERM GOALS: Target date: 06/16/2021   Patient donnes prosthesis modified independent & verbalizes proper cleaning Goal status: MET 06/14/2021 2.  Patient tolerates prosthesis >12 hrs total /day without skin issues or limb pain <3/10 after standing. Goal status: MET 06/14/2021   3.  Patient able to reach 7" and look over both shoulders without UE support with supervision. Goal status: MET 06/14/2021   4. Patient ambulates 200' with RW & prosthesis with supervision / verbal cues only. Goal status: MET 06/14/2021   5. Patient  negotiates ramps & curbs with RW & prosthesis with supervision. Goal status: MET 06/14/2021   SHORT TERM GOALS: Target date: 07/14/2021   Patient verbalized proper adjustment of ply socks. Goal status: ongoing 2.  Patient tolerates prosthesis >90% awake hrs /day without skin issues or limb pain </= 1/10 after standing / walking. Goal status: ongoing   3.  Patient able to pick up items from floor without UE support with supervision. Goal status: ongoing   4. Patient ambulates 100' with cane stand alone tip & prosthesis with min guard. Goal status:  ongoing  LONG TERM GOALS: Target date: 08/17/2021   Patient demonstrates & verbalized understanding of prosthetic care to enable safe utilization of prosthesis. Baseline: SEE OBJECTIVE DATA Goal status: INITIAL   Patient tolerates prosthesis wear >90% of awake hours without skin or limb pain issues. Baseline: SEE OBJECTIVE DATA Goal status: INITIAL   Berg Balance >/= 30/56 to indicate lower fall risk Baseline: SEE OBJECTIVE DATA Goal status: INITIAL   Patient ambulates >500' with LRAD & prosthesis modified independent. Baseline: SEE OBJECTIVE DATA Goal status: INITIAL   Patient negotiates ramps, curbs & stairs with single rail with LRAD & prosthesis modified independent. Baseline: SEE OBJECTIVE DATA Goal status: INITIAL     PLAN: PT FREQUENCY: 2x/week   PT DURATION: 12 weeks   PLANNED INTERVENTIONS: Therapeutic exercises, Therapeutic activity, Neuromuscular re-education, Balance training, Gait training, Patient/Family education, Stair training, Vestibular training, Prosthetic training, and DME instructions   PLAN FOR NEXT SESSION: did he add any ply sock to see if this improves fit and pain? continue prosthetic gait with cane stand alone tip. Therapeutic exercise & balance activities.      Debbe Odea, PT, DPT 06/28/2021, 9:37 AM

## 2021-06-28 NOTE — Patient Outreach (Signed)
Received a Health Coach referral notification for Mr. Estis. I have assigned Blanchie Serve, RN to call for follow up and determine if there are any Case Management needs.    Iverson Alamin, Donivan Scull [facility] Care Management Assistant [facility] Care Management [phone]

## 2021-06-30 ENCOUNTER — Ambulatory Visit (INDEPENDENT_AMBULATORY_CARE_PROVIDER_SITE_OTHER): Payer: Medicare Other | Admitting: Physical Therapy

## 2021-06-30 ENCOUNTER — Encounter: Payer: Self-pay | Admitting: Physical Therapy

## 2021-06-30 DIAGNOSIS — R2689 Other abnormalities of gait and mobility: Secondary | ICD-10-CM | POA: Diagnosis not present

## 2021-06-30 DIAGNOSIS — R293 Abnormal posture: Secondary | ICD-10-CM | POA: Diagnosis not present

## 2021-06-30 DIAGNOSIS — M6281 Muscle weakness (generalized): Secondary | ICD-10-CM

## 2021-06-30 DIAGNOSIS — R2681 Unsteadiness on feet: Secondary | ICD-10-CM

## 2021-06-30 NOTE — Therapy (Signed)
OUTPATIENT PHYSICAL THERAPY PROSTHETIC TREATMENT NOTE   Patient Name: Ryan [name] MRN: [id number] DOB:1950-06-21, 71 y.o., male Today's Date: 06/30/2021  PCP: Mirna Mires, MD REFERRING PROVIDER: Graceann Congress, PA-C       PT End of Session - 06/30/21 0848     Visit Number 13    Number of Visits 25    Date for PT Re-Evaluation 08/17/21    Authorization Type Medicare A/B & Generic commercial    Progress Note Due on Visit 10    PT Start Time 0845    PT Stop Time 0930    PT Time Calculation (min) 45 min    Equipment Utilized During Treatment Gait belt    Activity Tolerance Patient tolerated treatment well    Behavior During Therapy WFL for tasks assessed/performed                      Past Medical History:  Diagnosis Date   COVID 2022   Hypertension    PVD (peripheral vascular disease) (HCC)    Past Surgical History:  Procedure Laterality Date   ABDOMINAL AORTAGRAM Left 12/13/2016   ABDOMINAL AORTOGRAM W/LOWER EXTREMITY/notes 12/13/2016   ABDOMINAL AORTOGRAM W/LOWER EXTREMITY N/A 11/28/2016   Procedure: ABDOMINAL AORTOGRAM W/LOWER EXTREMITY;  Surgeon: Yates Decamp, MD;  Location: [facility];  Service: Cardiovascular;  Laterality: N/A;  bilateral   ABDOMINAL AORTOGRAM W/LOWER EXTREMITY N/A 12/13/2016   Procedure: ABDOMINAL AORTOGRAM W/LOWER EXTREMITY;  Surgeon: Maeola Harman, MD;  Location: [facility] INVASIVE CV LAB;  Service: Cardiovascular;  Laterality: N/A;  unilater left leg   ABDOMINAL AORTOGRAM W/LOWER EXTREMITY N/A 01/09/2018   Procedure: ABDOMINAL AORTOGRAM W/LOWER EXTREMITY;  Surgeon: Cephus Shelling, MD;  Location: [facility];  Service: Cardiovascular;  Laterality: N/A;   ABDOMINAL AORTOGRAM W/LOWER EXTREMITY Left 09/22/2019   Procedure: ABDOMINAL AORTOGRAM W/LOWER EXTREMITY;  Surgeon: Maeola Harman, MD;  Location: [facility] INVASIVE CV LAB;  Service: Cardiovascular;  Laterality: Left;   ABDOMINAL AORTOGRAM W/LOWER  EXTREMITY Left 12/02/2020   Procedure: ABDOMINAL AORTOGRAM W/LOWER EXTREMITY;  Surgeon: Cephus Shelling, MD;  Location: [facility];  Service: Cardiovascular;  Laterality: Left;   ABDOMINAL AORTOGRAM W/LOWER EXTREMITY N/A 12/22/2020   Procedure: ABDOMINAL AORTOGRAM W/LOWER EXTREMITY;  Surgeon: Victorino Sparrow, MD;  Location: [facility] INVASIVE CV LAB;  Service: Cardiovascular;  Laterality: N/A;   AMPUTATION Left 02/11/2021   Procedure: LEFT ABOVE KNEE AMPUTATION;  Surgeon: Cephus Shelling, MD;  Location: [facility] OR;  Service: Vascular;  Laterality: Left;   BYPASS GRAFT POPLITEAL TO TIBIAL Left 12/31/2020   Procedure: LEFT JUMP GRAFT REVISION BELOW KNEE POPLITEAL TO PERONEAL;  Surgeon: Cephus Shelling, MD;  Location: [facility] OR;  Service: Vascular;  Laterality: Left;   EMBOLIZATION Left 12/03/2020   Procedure: EMBOLIZATION;  Surgeon: Leonie Douglas, MD;  Location: [facility];  Service: Cardiovascular;  Laterality: Left;   FEMORAL-POPLITEAL BYPASS GRAFT Left 12/14/2016   Procedure: BYPASS GRAFT COMMON FEMORAL- BELOW KNEE POPLITEAL ARTERY with Bovine Patch to Below the knee poplitieal artery.;  Surgeon: Fransisco Hertz, MD;  Location: [facility] OR;  Service: Vascular;  Laterality: Left;   FEMORAL-POPLITEAL BYPASS GRAFT Left 01/28/2018   Procedure: BYPASS GRAFT FEMORAL-BELOW KNEE POPLITEAL ARTERY REDO with propaten vascular graft removable ring;  Surgeon: Cephus Shelling, MD;  Location: [facility] OR;  Service: Vascular;  Laterality: Left;   FEMUR SURGERY Right 2009   pt. has a rod in that leg   LOWER EXTREMITY ANGIOGRAPHY Left  11/28/2016   Procedure: Lower Extremity Angiography;  Surgeon: Yates Decamp, MD;  Location: [facility] INVASIVE CV LAB;  Service: Cardiovascular;  Laterality: Left;  lysis followup lower leg   PATCH ANGIOPLASTY Left 01/28/2018   Procedure: PATCH ANGIOPLASTY USING Livia Snellen BIOLOGIC PATCH;  Surgeon: Cephus Shelling, MD;  Location: [facility] OR;  Service: Vascular;  Laterality: Left;   PATCH  ANGIOPLASTY Left 06/16/2020   Procedure: PATCH ANGIOPLASTY OF LEFT BELOW KNEE POPITEAL ARTERY;  Surgeon: Cephus Shelling, MD;  Location: [facility] OR;  Service: Vascular;  Laterality: Left;   PERIPHERAL VASCULAR BALLOON ANGIOPLASTY  11/28/2016   Procedure: PERIPHERAL VASCULAR BALLOON ANGIOPLASTY;  Surgeon: Yates Decamp, MD;  Location: [facility];  Service: Cardiovascular;;  SFA   PERIPHERAL VASCULAR BALLOON ANGIOPLASTY  12/03/2020   Procedure: PERIPHERAL VASCULAR BALLOON ANGIOPLASTY;  Surgeon: Leonie Douglas, MD;  Location: [facility];  Service: Cardiovascular;;  TP Trunk and Peroneal   PERIPHERAL VASCULAR CATHETERIZATION N/A 08/11/2014   Procedure: Lower Extremity Angiography;  Surgeon: Yates Decamp, MD;  Location: [facility] INVASIVE CV LAB;  Service: Cardiovascular;  Laterality: N/A;   PERIPHERAL VASCULAR INTERVENTION Left 09/22/2019   Procedure: PERIPHERAL VASCULAR INTERVENTION;  Surgeon: Maeola Harman, MD;  Location: [facility] INVASIVE CV LAB;  Service: Cardiovascular;  Laterality: Left;   PERIPHERAL VASCULAR INTERVENTION Left 09/23/2019   Procedure: PERIPHERAL VASCULAR INTERVENTION;  Surgeon: Nada Libman, MD;  Location: [facility];  Service: Cardiovascular;  Laterality: Left;   PERIPHERAL VASCULAR INTERVENTION Left 05/13/2020   Procedure: PERIPHERAL VASCULAR INTERVENTION;  Surgeon: Cephus Shelling, MD;  Location: [facility];  Service: Cardiovascular;  Laterality: Left;   PERIPHERAL VASCULAR INTERVENTION Left 12/02/2020   Procedure: PERIPHERAL VASCULAR INTERVENTION;  Surgeon: Cephus Shelling, MD;  [facility]: [facility];  Service: Cardiovascular;  Laterality: Left;   PERIPHERAL VASCULAR INTERVENTION  12/03/2020   Procedure: PERIPHERAL VASCULAR INTERVENTION;  Surgeon: Leonie Douglas, MD;  Location: [facility];  Service: Cardiovascular;;   PERIPHERAL VASCULAR INTERVENTION Left 12/22/2020   Procedure: PERIPHERAL VASCULAR INTERVENTION;  Surgeon:  Victorino Sparrow, MD;  Location: [facility] INVASIVE CV LAB;  Service: Cardiovascular;  Laterality: Left;   PERIPHERAL VASCULAR THROMBECTOMY  11/28/2016   Procedure: PERIPHERAL VASCULAR THROMBECTOMY;  Surgeon: Yates Decamp, MD;  Location: [facility];  Service: Cardiovascular;;  Profunda   PERIPHERAL VASCULAR THROMBECTOMY  11/28/2016   Procedure: PERIPHERAL VASCULAR THROMBECTOMY;  Surgeon: Yates Decamp, MD;  Location: [facility];  Service: Cardiovascular;;   PERIPHERAL VASCULAR THROMBECTOMY N/A 05/12/2020   Procedure: PERIPHERAL VASCULAR THROMBECTOMY-Left Leg;  Surgeon: Leonie Douglas, MD;  Location: [facility];  Service: Cardiovascular;  Laterality: N/A;   PERIPHERAL VASCULAR THROMBECTOMY N/A 05/13/2020   Procedure: PERIPHERAL VASCULAR THROMBECTOMY- LYSIS RECHECK;  Surgeon: Cephus Shelling, MD;  Location: [facility];  Service: Cardiovascular;  Laterality: N/A;   PERIPHERAL VASCULAR THROMBECTOMY N/A 12/02/2020   Procedure: PERIPHERAL VASCULAR THROMBECTOMY;  Surgeon: Cephus Shelling, MD;  Location: [facility];  Service: Cardiovascular;  Laterality: N/A;   PERIPHERAL VASCULAR THROMBECTOMY N/A 12/03/2020   Procedure: LYSIS RECHECK;  Surgeon: Leonie Douglas, MD;  Location: [facility];  Service: Cardiovascular;  Laterality: N/A;   PULMONARY THROMBECTOMY N/A 09/23/2019   Procedure: LYSIS RECHECK;  Surgeon: Nada Libman, MD;  Location: [facility];  Service: Cardiovascular;  Laterality: N/A;   THROMBECTOMY FEMORAL ARTERY Left 12/14/2016   Procedure: THROMBECTOMY PROFUNDA FEMORAL ARTERY;  Surgeon: Fransisco Hertz, MD;  Location: [facility] OR;  Service: Vascular;  Laterality: Left;   THROMBECTOMY FEMORAL ARTERY Left 06/16/2020   Procedure: Redo exposure ot Left below knee popiteal artery;  Surgeon: Cephus Shelling, MD;  Location: [facility] OR;  Service: Vascular;  Laterality: Left;   THROMBECTOMY OF BYPASS GRAFT FEMORAL- POPLITEAL ARTERY Left 06/16/2020    Procedure: THROMBECTOMY OF BYPASS GRAFT FEMORAL-POPLITEAL ARTERY and TIBIAL ARTERY;  Surgeon: Cephus Shelling, MD;  Location: [facility] OR;  Service: Vascular;  Laterality: Left;   THROMBECTOMY OF BYPASS GRAFT FEMORAL- POPLITEAL ARTERY Left 12/31/2020   Procedure: THROMBECTOMY OF LEFT COMMON FEMORAL TO BELOW KNEE POPLITEAL ARTERY BYPASS GRAFT;  Surgeon: Cephus Shelling, MD;  Location: [facility] OR;  Service: Vascular;  Laterality: Left;   VEIN HARVEST Left 12/31/2020   Procedure: Left Greater Saphenous Vein Harvest;  Surgeon: Cephus Shelling, MD;  Location: [facility] OR;  Service: Vascular;  Laterality: Left;   Patient Active Problem List   Diagnosis Date Noted   Peripheral artery disease (HCC) 12/31/2020   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   Thrombosis of femoro-popliteal bypass graft (HCC) 05/12/2020   Critical lower limb ischemia (HCC) 12/11/2016   Claudication in peripheral vascular disease (HCC) 11/28/2016   HTN (hypertension) 10/09/2014   Tobacco use 10/09/2014   PAD (peripheral artery disease) (HCC)     REFERRING DIAG: 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  He had some red spots where sock may have wrinkled. He took a break one day which limb was less sore this morning.  He has not spoken to doctor yet if Gabapentin dose can be lowered.  He is not taking it because he feels drunk.   PAIN:  Are you having pain?  Yes: NPRS scale: arrived to PT 2/10.  Tuesday evening 4/10 Pain location: distal residual limb Pain description: throbbing & aching Aggravating factors: when he removes  is onset of pain which is worst, then throbbing Relieving factors: wearing prosthesis  OBJECTIVE:    POSTURE: 05/19/21:  rounded shoulders, forward head, flexed trunk , and weight shift right   LE ROM:   ROM P:passive  A:active Right 05/19/2021 Left 05/19/2021  Hip flexion      Hip extension   -15* standing  Hip abduction      Hip adduction      Hip internal rotation      Hip external rotation      Knee flexion      Knee extension      Ankle dorsiflexion      Ankle plantarflexion      Ankle inversion      Ankle eversion       (Blank rows = not tested) MMT:   MMT Right 05/19/2021 Left 05/19/2021  Hip flexion      Hip extension   3/5 Gross functional  Hip abduction   3/5 Gross functional  Hip adduction      Hip internal rotation      Hip external rotation      Knee flexion      Knee extension  Ankle dorsiflexion      Ankle plantarflexion      Ankle inversion      Ankle eversion      (Blank rows = not tested)   TRANSFERS: Sit to stand: 05/19/21:  SBA and requires UE assist with armrests & RW touch to stabilize Stand to sit: 05/19/21:  SBA and requires UE assist with armrests & RW stabilization   GAIT: 05/19/21:  Gait pattern: step to pattern, decreased step length- Right, decreased stance time- Left, decreased hip/knee flexion- Left, circumduction- Left, Left hip hike, antalgic, trunk flexed, abducted- Left, and poor foot clearance- Left  Distance walked: 17' Assistive device utilized: Environmental consultant - 2 wheeled and TFA prosthesis  excessive UE weight bearing Level of assistance: SBA   FUNCTIONAL TESTs:  Berg Balance Scale: 05/19/21:  17/56   [facility] PT Assessment - 05/19/21 0900                Standardized Balance Assessment    Standardized Balance Assessment Berg Balance Test          Berg Balance Test    Sit to Stand Needs minimal aid to stand or to stabilize     Standing Unsupported Able to stand 2 minutes with supervision     Sitting with Back Unsupported but  Feet Supported on Floor or Stool Able to sit safely and securely 2 minutes     Stand to Sit Controls descent by using hands     Transfers Able to transfer safely, definite need of hands     Standing Unsupported with Eyes Closed Unable to keep eyes closed 3 seconds but stays steady     Standing Unsupported with Feet Together Needs help to attain position but able to stand for 30 seconds with feet together     From Standing, Reach Forward with Outstretched Arm Reaches forward but needs supervision     From Standing Position, Pick up Object from Floor Unable to try/needs assist to keep balance     From Standing Position, Turn to Look Behind Over each Shoulder Needs assist to keep from losing balance and falling     Turn 360 Degrees Needs assistance while turning     Standing Unsupported, Alternately Place Feet on Step/Stool Needs assistance to keep from falling or unable to try     Standing Unsupported, One Foot in [employer] balance while stepping or standing     Standing on One Leg Unable to try or needs assist to prevent fall     Total Score 17     Berg comment: BERG  < 36 high risk for falls (close to 100%) 46-51 moderate (>50%)   37-45 significant (>80%) 52-55 lower (> 25%)                   CURRENT PROSTHETIC WEAR ASSESSMENT: 05/19/21:  Patient is dependent with: skin check, residual limb care, care of non-amputated limb, prosthetic cleaning, ply sock cleaning, correct ply sock adjustment, proper wear schedule/adjustment, and proper weight-bearing schedule/adjustment Donning prosthesis: SBA Doffing prosthesis: Modified independence Prosthetic wear tolerance: 5-6 hours/day, 16 of 16 days since delivery.  Prosthetic weight bearing tolerance: 5 minutes with partial weight on prosthesis & intermittent support with RW Residual limb condition: 2 invaginated areas with one not clean, 1 small area on incision that may be suture working way out, dry skin, cylinderical shape, nonpitting edema,   Prosthetic description: silicon liner with velcro lanyard, ischial containment socket with flexible inner liner, SAFETY knee (single axis  nonhydraulic), K2 flexible keel foot     TODAY'S TREATMENT:  06/30/2021 Prosthetic Training with Transfemoral Amputation PT instructed in pumping action of prosthesis with weighting & unweighting and relationship to limb volume changes.  He may need to add 1-3 ply socks during day if he notices increased distal limb pressure. Pt verbalized understanding. PT instructed in large difference in wear & no wear like taking off all day can slow tolerance. Limiting weight bearing more than wear is more effective.  PT reviewed donning socks with proximal folded over socket to minimize migration into socket with wrinkles. PT reviewed how to determine if suspension strap is tight and grip to fully tighten. Pt return demo understanding. Pt verbalized understanding.  Neuromuscular re-ed for upright posture: PT instructed if tightness in hip flexors then residual limb pulls forward into hard socket which may be related to his pain.   PT reviewed 3 exercises to work on stretching hip flexors & upright posture. Standing back to door frame reaching single & BUEs overhead with 2 deep breathes 2 reps ea. Recommendation is >/=5 times per day as should only take 1-2 min.  Standing back to counter with hands resting on counter. Goal is 5-10 min 2x/day. Supine hooklying with prosthesis over edge of bed. 30 sec hold 2 reps 2x/day.  Pt verbalized & return demo understanding including relationship to limb pain.  Balance with feet hip width apart: tactile & verbal cues for balance reactions.  On floor eyes open head movement 4 directions 5 reps ea. On floor eyes closed head movement 4 directions 5 reps ea On foam eyes open head movement 4 directions 5 reps ea.   06/28/2021 Prosthetic Training with Transfemoral Amputation Doffed prosthetic leg and examined his distal residual limb  due to complaints of pain and no obvious redness or pressure points noted however when donning back on he may be too far down in socket so advised to try using more ply sock to see if this helps as he arrives with only 1 ply sock. Pt navigated 6 inch step ups X8 with Rt leg and down in front leading with left leg in // bars with supervision with verbal cues for technique Pt ambulated 65'  X2 with cane stand alone tip & HHA modA to HHA minA with cues on sequence, upright posture & wt shift over prosthesis in stance.  Neuromuscular Re-ed: Standing on foam pad with RLE intermittent touch on //bars & minA tactile cues from PT: eyes open head turns 10 reps ea 4 directions and static eyes closed 10 sec 5 reps. sidestepping 3 round trips in bars   Therex Leg press DL 37# [id number], then Rt leg only 50# 2X15 Sit to stands with UE support X 10 throughout session  06/23/2021 Prosthetic Training with Transfemoral Amputation Pt ambulated >200' in/out of building with RW safely, I did add tennis balls to his RW.  Pt navigated 6 inch step ups X10 with Rt leg and down in front leading with left leg in // bars with supervision with verbal cues for technique Pt ambulated 63'  X3 with cane stand alone tip & HHA modA to HHA minA with cues on sequence, upright posture & wt shift over prosthesis in stance.  Neuromuscular Re-ed: Standing on foam pad with RLE intermittent touch on //bars & minA tactile cues from PT: eyes open head turns 10 reps ea 4 directions and static eyes closed 10 sec 5 reps. sidestepping 3 round trips in bars   Therex Leg press  DL 40# [id number], then Rt leg only 50# 2X15 Sit to stands with UE support X 10 throughout session     05/31/21 1426  PT Education  Education Details HEP for posture & hip flexor stretch  Access Code: [email or web address]  Person(s) Educated Patient;Spouse  Methods Explanation;Demonstration;Tactile cues;Verbal cues;Handout  Comprehension Verbalized understanding;Returned  demonstration;Tactile cues required;Verbal cues required;Need further instruction   Access Code: [email or web address] URL: [email or web address] Date: 05/31/2021 Prepared by: Vladimir Faster  Exercises - Seated Hamstring Stretch with Strap  - 2 x daily - 7 x weekly - 1 sets - 2-3 reps - 30 seconds hold - Modified Thaden Stretch  - 2 x daily - 7 x weekly - 1 sets - 2 reps - 30 seconds hold - Supine Dynamic Modified Dolliver Quad and Hip Flexor Dynamic Stretch  - 2 x daily - 7 x weekly - 1 sets - 2 reps - 30 seconds hold - Standing posture with back to counter  - 2 x daily - 7 x weekly - 1 sets - 1 reps - 5-10 minutes hold - Upright Stance at Door Frame Single Arm  - 3-6 x daily - 7 x weekly - 1 sets - 2 reps - 2 deep breathes hold - Upright Stance at Door Frame with Both Arms  - 3-6 x daily - 7 x weekly - 1 sets - 2 reps - 2 deep breathes hold   05/24/21 1151  PT Education  Education Details HEP at sink & residual limb pain vs phantom sensation vs phantom pain  Person(s) Educated Patient  Methods Explanation;Demonstration;Tactile cues;Verbal cues;Handout  Comprehension Verbalized understanding;Returned demonstration;Verbal cues required;Tactile cues required;Need further instruction   PROSTHETIC TRAINING: Wear time Continue wear 2x/day for 4hours.  Limb condition denies issues Weight bearing tolerated standing for 15 min without c/o limb pain.   PATIENT EDUCATION: Education details: sit to/from stand controlling prosthesis without armrests, Proper donning tightening suspension strap and Proper wear schedule/adjustment Person educated: Patient and Spouse Education method: Explanation, Demonstration, Tactile cues, Verbal cues, and Handouts Education comprehension: verbalized understanding, verbal cues required, and needs further education     ASSESSMENT:   CLINICAL IMPRESSION: Pt appears to understand PT recommendations for adjusting ply socks during day, tightening strap, donning  socks & upright posture with hip flexor stretch to address pain.     OBJECTIVE IMPAIRMENTS Abnormal gait, decreased activity tolerance, decreased balance, decreased endurance, decreased knowledge of condition, decreased knowledge of use of DME, decreased mobility, decreased ROM, decreased strength, impaired flexibility, postural dysfunction, prosthetic dependency , and pain.    ACTIVITY LIMITATIONS community activity and occupation.    PERSONAL FACTORS Fitness, Time since onset of injury/illness/exacerbation, and 3+ comorbidities: see PMH  are also affecting patient's functional outcome.    REHAB POTENTIAL: Good   CLINICAL DECISION MAKING: Evolving/moderate complexity   EVALUATION COMPLEXITY: Moderate     GOALS: Goals reviewed with patient? Yes   SHORT TERM GOALS: Target date: 07/14/2021   Patient verbalized proper adjustment of ply socks. Goal status: ongoing 2.  Patient tolerates prosthesis >90% awake hrs /day without skin issues or limb pain </= 1/10 after standing / walking. Goal status: ongoing   3.  Patient able to pick up items from floor without UE support with supervision. Goal status: ongoing   4. Patient ambulates 100' with cane stand alone tip & prosthesis with min guard. Goal status: ongoing  LONG TERM GOALS: Target date: 08/17/2021   Patient demonstrates & verbalized understanding of prosthetic care to enable safe utilization of prosthesis. Baseline:  SEE OBJECTIVE DATA Goal status: INITIAL   Patient tolerates prosthesis wear >90% of awake hours without skin or limb pain issues. Baseline: SEE OBJECTIVE DATA Goal status: INITIAL   Berg Balance >/= 30/56 to indicate lower fall risk Baseline: SEE OBJECTIVE DATA Goal status: INITIAL   Patient ambulates >500' with LRAD & prosthesis modified independent. Baseline: SEE OBJECTIVE DATA Goal status: INITIAL   Patient negotiates ramps, curbs & stairs with single rail with LRAD & prosthesis modified  independent. Baseline: SEE OBJECTIVE DATA Goal status: INITIAL     PLAN: PT FREQUENCY: 2x/week   PT DURATION: 12 weeks   PLANNED INTERVENTIONS: Therapeutic exercises, Therapeutic activity, Neuromuscular re-education, Balance training, Gait training, Patient/Family education, Stair training, Vestibular training, Prosthetic training, and DME instructions   PLAN FOR NEXT SESSION:  check if updated prosthetic education & HEP addressed pain. Work towards Mellon FinancialSTGs, balance & gait with cane.     Vladimir Fasterobin Clemencia Helzer, PT, DPT 06/30/2021, 2:22 PM

## 2021-07-04 ENCOUNTER — Ambulatory Visit (INDEPENDENT_AMBULATORY_CARE_PROVIDER_SITE_OTHER): Payer: Medicare Other | Admitting: Physical Therapy

## 2021-07-04 ENCOUNTER — Encounter: Payer: Self-pay | Admitting: Physical Therapy

## 2021-07-04 DIAGNOSIS — M6281 Muscle weakness (generalized): Secondary | ICD-10-CM | POA: Diagnosis not present

## 2021-07-04 DIAGNOSIS — R293 Abnormal posture: Secondary | ICD-10-CM | POA: Diagnosis not present

## 2021-07-04 DIAGNOSIS — R2689 Other abnormalities of gait and mobility: Secondary | ICD-10-CM

## 2021-07-04 DIAGNOSIS — R2681 Unsteadiness on feet: Secondary | ICD-10-CM

## 2021-07-04 NOTE — Progress Notes (Signed)
   07/04/21 1250  PT Education  Education Details Access Code: [email or web address]  Person(s) Educated Patient  Methods Explanation;Demonstration;Tactile cues;Verbal cues;Handout  Comprehension Verbalized understanding;Returned demonstration;Verbal cues required;Tactile cues required;Need further instruction

## 2021-07-04 NOTE — Therapy (Signed)
OUTPATIENT PHYSICAL THERAPY PROSTHETIC TREATMENT NOTE   Patient Name: Ryan [name] MRN: [id number] DOB:[birth year], 71 y.o., male Today's Date: 07/04/2021  PCP: Mirna Mires, MD REFERRING PROVIDER: Graceann Congress, PA-C       PT End of Session - 07/04/21 1149     Visit Number 14    Number of Visits 25    Date for PT Re-Evaluation 08/17/21    Authorization Type Medicare A/B & Generic commercial    Progress Note Due on Visit 20    PT Start Time 1145    PT Stop Time 1238    PT Time Calculation (min) 53 min    Equipment Utilized During Treatment Gait belt    Activity Tolerance Patient tolerated treatment well    Behavior During Therapy WFL for tasks assessed/performed                       Past Medical History:  Diagnosis Date   COVID 2022   Hypertension    PVD (peripheral vascular disease) (HCC)    Past Surgical History:  Procedure Laterality Date   ABDOMINAL AORTAGRAM Left 12/13/2016   ABDOMINAL AORTOGRAM W/LOWER EXTREMITY/notes 12/13/2016   ABDOMINAL AORTOGRAM W/LOWER EXTREMITY N/A 11/28/2016   Procedure: ABDOMINAL AORTOGRAM W/LOWER EXTREMITY;  Surgeon: Yates Decamp, MD;  Location: [facility];  Service: Cardiovascular;  Laterality: N/A;  bilateral   ABDOMINAL AORTOGRAM W/LOWER EXTREMITY N/A 12/13/2016   Procedure: ABDOMINAL AORTOGRAM W/LOWER EXTREMITY;  Surgeon: Maeola Harman, MD;  Location: [facility] INVASIVE CV LAB;  Service: Cardiovascular;  Laterality: N/A;  unilater left leg   ABDOMINAL AORTOGRAM W/LOWER EXTREMITY N/A 01/09/2018   Procedure: ABDOMINAL AORTOGRAM W/LOWER EXTREMITY;  Surgeon: Cephus Shelling, MD;  Location: [facility];  Service: Cardiovascular;  Laterality: N/A;   ABDOMINAL AORTOGRAM W/LOWER EXTREMITY Left 09/22/2019   Procedure: ABDOMINAL AORTOGRAM W/LOWER EXTREMITY;  Surgeon: Maeola Harman, MD;  Location: [facility] INVASIVE CV LAB;  Service: Cardiovascular;  Laterality: Left;   ABDOMINAL AORTOGRAM W/LOWER  EXTREMITY Left 12/02/2020   Procedure: ABDOMINAL AORTOGRAM W/LOWER EXTREMITY;  Surgeon: Cephus Shelling, MD;  Location: [facility];  Service: Cardiovascular;  Laterality: Left;   ABDOMINAL AORTOGRAM W/LOWER EXTREMITY N/A 12/22/2020   Procedure: ABDOMINAL AORTOGRAM W/LOWER EXTREMITY;  Surgeon: Victorino Sparrow, MD;  Location: [facility] INVASIVE CV LAB;  Service: Cardiovascular;  Laterality: N/A;   AMPUTATION Left 02/11/2021   Procedure: LEFT ABOVE KNEE AMPUTATION;  Surgeon: Cephus Shelling, MD;  Location: [facility] OR;  Service: Vascular;  Laterality: Left;   BYPASS GRAFT POPLITEAL TO TIBIAL Left 12/31/2020   Procedure: LEFT JUMP GRAFT REVISION BELOW KNEE POPLITEAL TO PERONEAL;  Surgeon: Cephus Shelling, MD;  Location: [facility] OR;  Service: Vascular;  Laterality: Left;   EMBOLIZATION Left 12/03/2020   Procedure: EMBOLIZATION;  Surgeon: Leonie Douglas, MD;  Location: [facility];  Service: Cardiovascular;  Laterality: Left;   FEMORAL-POPLITEAL BYPASS GRAFT Left 12/14/2016   Procedure: BYPASS GRAFT COMMON FEMORAL- BELOW KNEE POPLITEAL ARTERY with Bovine Patch to Below the knee poplitieal artery.;  Surgeon: Fransisco Hertz, MD;  Location: [facility] OR;  Service: Vascular;  Laterality: Left;   FEMORAL-POPLITEAL BYPASS GRAFT Left 01/28/2018   Procedure: BYPASS GRAFT FEMORAL-BELOW KNEE POPLITEAL ARTERY REDO with propaten vascular graft removable ring;  Surgeon: Cephus Shelling, MD;  Location: [facility] OR;  Service: Vascular;  Laterality: Left;   FEMUR SURGERY Right 2009   pt. has a rod in that leg   LOWER EXTREMITY ANGIOGRAPHY  Left 11/28/2016   Procedure: Lower Extremity Angiography;  Surgeon: Yates Decamp, MD;  Location: [facility] INVASIVE CV LAB;  Service: Cardiovascular;  Laterality: Left;  lysis followup lower leg   PATCH ANGIOPLASTY Left 01/28/2018   Procedure: PATCH ANGIOPLASTY USING Livia Snellen BIOLOGIC PATCH;  Surgeon: Cephus Shelling, MD;  Location: [facility] OR;  Service: Vascular;  Laterality: Left;   PATCH  ANGIOPLASTY Left 06/16/2020   Procedure: PATCH ANGIOPLASTY OF LEFT BELOW KNEE POPITEAL ARTERY;  Surgeon: Cephus Shelling, MD;  Location: [facility] OR;  Service: Vascular;  Laterality: Left;   PERIPHERAL VASCULAR BALLOON ANGIOPLASTY  11/28/2016   Procedure: PERIPHERAL VASCULAR BALLOON ANGIOPLASTY;  Surgeon: Yates Decamp, MD;  Location: [facility];  Service: Cardiovascular;;  SFA   PERIPHERAL VASCULAR BALLOON ANGIOPLASTY  12/03/2020   Procedure: PERIPHERAL VASCULAR BALLOON ANGIOPLASTY;  Surgeon: Leonie Douglas, MD;  Location: [facility];  Service: Cardiovascular;;  TP Trunk and Peroneal   PERIPHERAL VASCULAR CATHETERIZATION N/A 08/11/2014   Procedure: Lower Extremity Angiography;  Surgeon: Yates Decamp, MD;  Location: [facility] INVASIVE CV LAB;  Service: Cardiovascular;  Laterality: N/A;   PERIPHERAL VASCULAR INTERVENTION Left 09/22/2019   Procedure: PERIPHERAL VASCULAR INTERVENTION;  Surgeon: Maeola Harman, MD;  Location: [facility] INVASIVE CV LAB;  Service: Cardiovascular;  Laterality: Left;   PERIPHERAL VASCULAR INTERVENTION Left 09/23/2019   Procedure: PERIPHERAL VASCULAR INTERVENTION;  Surgeon: Nada Libman, MD;  Location: [facility];  Service: Cardiovascular;  Laterality: Left;   PERIPHERAL VASCULAR INTERVENTION Left 05/13/2020   Procedure: PERIPHERAL VASCULAR INTERVENTION;  Surgeon: Cephus Shelling, MD;  Location: [facility];  Service: Cardiovascular;  Laterality: Left;   PERIPHERAL VASCULAR INTERVENTION Left 12/02/2020   Procedure: PERIPHERAL VASCULAR INTERVENTION;  Surgeon: Cephus Shelling, MD;  [facility]: [facility];  Service: Cardiovascular;  Laterality: Left;   PERIPHERAL VASCULAR INTERVENTION  12/03/2020   Procedure: PERIPHERAL VASCULAR INTERVENTION;  Surgeon: Leonie Douglas, MD;  [facility]: [facility];  Service: Cardiovascular;;   PERIPHERAL VASCULAR INTERVENTION Left 12/22/2020   Procedure: PERIPHERAL VASCULAR INTERVENTION;  Surgeon:  Victorino Sparrow, MD;  Location: [facility] INVASIVE CV LAB;  Service: Cardiovascular;  Laterality: Left;   PERIPHERAL VASCULAR THROMBECTOMY  11/28/2016   Procedure: PERIPHERAL VASCULAR THROMBECTOMY;  Surgeon: Yates Decamp, MD;  [facility]: [facility];  Service: Cardiovascular;;  Profunda   PERIPHERAL VASCULAR THROMBECTOMY  11/28/2016   Procedure: PERIPHERAL VASCULAR THROMBECTOMY;  Surgeon: Yates Decamp, MD;  Location: [facility];  Service: Cardiovascular;;   PERIPHERAL VASCULAR THROMBECTOMY N/A 05/12/2020   Procedure: PERIPHERAL VASCULAR THROMBECTOMY-Left Leg;  Surgeon: Leonie Douglas, MD;  [facility]: [facility];  Service: Cardiovascular;  Laterality: N/A;   PERIPHERAL VASCULAR THROMBECTOMY N/A 05/13/2020   Procedure: PERIPHERAL VASCULAR THROMBECTOMY- LYSIS RECHECK;  Surgeon: Cephus Shelling, MD;  Location: [facility];  Service: Cardiovascular;  Laterality: N/A;   PERIPHERAL VASCULAR THROMBECTOMY N/A 12/02/2020   Procedure: PERIPHERAL VASCULAR THROMBECTOMY;  Surgeon: Cephus Shelling, MD;  Location: [facility];  Service: Cardiovascular;  Laterality: N/A;   PERIPHERAL VASCULAR THROMBECTOMY N/A 12/03/2020   Procedure: LYSIS RECHECK;  Surgeon: Leonie Douglas, MD;  Location: [facility];  Service: Cardiovascular;  Laterality: N/A;   PULMONARY THROMBECTOMY N/A 09/23/2019   Procedure: LYSIS RECHECK;  Surgeon: Nada Libman, MD;  Location: [facility];  Service: Cardiovascular;  Laterality: N/A;   THROMBECTOMY FEMORAL ARTERY Left 12/14/2016   Procedure: THROMBECTOMY PROFUNDA FEMORAL ARTERY;  Surgeon: Fransisco Hertz, MD;  Location: [facility] OR;  Service: Vascular;  Laterality: Left;   THROMBECTOMY FEMORAL ARTERY Left 06/16/2020   Procedure: Redo exposure ot Left below knee popiteal artery;  Surgeon: Cephus Shelling, MD;  Location: [facility] OR;  Service: Vascular;  Laterality: Left;   THROMBECTOMY OF BYPASS GRAFT FEMORAL- POPLITEAL ARTERY Left 06/16/2020    Procedure: THROMBECTOMY OF BYPASS GRAFT FEMORAL-POPLITEAL ARTERY and TIBIAL ARTERY;  Surgeon: Cephus Shelling, MD;  Location: [facility] OR;  Service: Vascular;  Laterality: Left;   THROMBECTOMY OF BYPASS GRAFT FEMORAL- POPLITEAL ARTERY Left 12/31/2020   Procedure: THROMBECTOMY OF LEFT COMMON FEMORAL TO BELOW KNEE POPLITEAL ARTERY BYPASS GRAFT;  Surgeon: Cephus Shelling, MD;  Location: [facility] OR;  Service: Vascular;  Laterality: Left;   VEIN HARVEST Left 12/31/2020   Procedure: Left Greater Saphenous Vein Harvest;  Surgeon: Cephus Shelling, MD;  Location: [facility] OR;  Service: Vascular;  Laterality: Left;   Patient Active Problem List   Diagnosis Date Noted   Peripheral artery disease (HCC) 12/31/2020   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   Thrombosis of femoro-popliteal bypass graft (HCC) 05/12/2020   Critical lower limb ischemia (HCC) 12/11/2016   Claudication in peripheral vascular disease (HCC) 11/28/2016   HTN (hypertension) 10/09/2014   Tobacco use 10/09/2014   PAD (peripheral artery disease) (HCC)     REFERRING DIAG: 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 "I feel like a kid with a new toy."  PAIN:  Are you having pain?  Yes: NPRS scale: arrived to PT 0/10.  Over the weekend up to 4/10 Pain location: distal residual limb Pain description: cramping when he wakes up Aggravating factors: unknown Relieving factors: wearing prosthesis  OBJECTIVE:    POSTURE: 05/19/21:  rounded shoulders, forward head, flexed trunk , and weight shift right   LE ROM:   ROM P:passive  A:active Right 05/19/2021  Left 05/19/2021  Hip flexion      Hip extension   -15* standing  Hip abduction      Hip adduction      Hip internal rotation      Hip external rotation      Knee flexion      Knee extension      Ankle dorsiflexion      Ankle plantarflexion      Ankle inversion      Ankle eversion       (Blank rows = not tested) MMT:   MMT Right 05/19/2021 Left 05/19/2021  Hip flexion      Hip extension   3/5 Gross functional  Hip abduction   3/5 Gross functional  Hip adduction      Hip internal rotation      Hip external rotation      Knee flexion      Knee extension      Ankle dorsiflexion      Ankle plantarflexion      Ankle inversion      Ankle eversion      (Blank rows = not tested)   TRANSFERS: Sit to stand: 05/19/21:  SBA and requires UE assist with armrests & RW touch  to stabilize Stand to sit: 05/19/21:  SBA and requires UE assist with armrests & RW stabilization   GAIT: 05/19/21:  Gait pattern: step to pattern, decreased step length- Right, decreased stance time- Left, decreased hip/knee flexion- Left, circumduction- Left, Left hip hike, antalgic, trunk flexed, abducted- Left, and poor foot clearance- Left  Distance walked: 12' Assistive device utilized: Environmental consultant - 2 wheeled and TFA prosthesis  excessive UE weight bearing Level of assistance: SBA   FUNCTIONAL TESTs:  Berg Balance Scale: 05/19/21:  17/56   [facility] PT Assessment - 05/19/21 0900                Standardized Balance Assessment    Standardized Balance Assessment Berg Balance Test          Berg Balance Test    Sit to Stand Needs minimal aid to stand or to stabilize     Standing Unsupported Able to stand 2 minutes with supervision     Sitting with Back Unsupported but Feet Supported on Floor or Stool Able to sit safely and securely 2 minutes     Stand to Sit Controls descent by using hands     Transfers Able to transfer safely, definite need of hands     Standing Unsupported with Eyes Closed Unable to keep eyes  closed 3 seconds but stays steady     Standing Unsupported with Feet Together Needs help to attain position but able to stand for 30 seconds with feet together     From Standing, Reach Forward with Outstretched Arm Reaches forward but needs supervision     From Standing Position, Pick up Object from Floor Unable to try/needs assist to keep balance     From Standing Position, Turn to Look Behind Over each Shoulder Needs assist to keep from losing balance and falling     Turn 360 Degrees Needs assistance while turning     Standing Unsupported, Alternately Place Feet on Step/Stool Needs assistance to keep from falling or unable to try     Standing Unsupported, One Foot in [employer] balance while stepping or standing     Standing on One Leg Unable to try or needs assist to prevent fall     Total Score 17     Berg comment: BERG  < 36 high risk for falls (close to 100%) 46-51 moderate (>50%)   37-45 significant (>80%) 52-55 lower (> 25%)                   CURRENT PROSTHETIC WEAR ASSESSMENT: 05/19/21:  Patient is dependent with: skin check, residual limb care, care of non-amputated limb, prosthetic cleaning, ply sock cleaning, correct ply sock adjustment, proper wear schedule/adjustment, and proper weight-bearing schedule/adjustment Donning prosthesis: SBA Doffing prosthesis: Modified independence Prosthetic wear tolerance: 5-6 hours/day, 16 of 16 days since delivery.  Prosthetic weight bearing tolerance: 5 minutes with partial weight on prosthesis & intermittent support with RW Residual limb condition: 2 invaginated areas with one not clean, 1 small area on incision that may be suture working way out, dry skin, cylinderical shape, nonpitting edema,  Prosthetic description: silicon liner with velcro lanyard, ischial containment socket with flexible inner liner, SAFETY knee (single axis nonhydraulic), K2 flexible keel foot     TODAY'S TREATMENT:  07/04/2021 Prosthetic Training with Transfemoral  Prosthesis: Pt ambulated 80' X 2 with cane stand alone tip initially with HHA then progressed to no support LUE. He requires modA to minA. Verbal & tactile cues on upright posture, initial contact  timing with heel, wt shift over prosthesis in stance.  Therapeutic Exercise: Nustep seat 8 level 6 with BLEs & BUEs for 5 min  Neuromuscular Re-ed:  Balance facilitating visual, proprioception & vestibular systems: tactile & verbal cues for balance reactions.  On floor with feet together eyes open head movement 4 directions 5 reps ea. On floor with feet hip width apart: eyes closed head movement 4 directions 5 reps ea min guard On foam with feet hip width apart: eyes open head movement 4 directions 5 reps ea minA  Updated HEP for balance.     07/04/21 1250  PT Education  Education Details Access Code: [email or web address]  Person(s) Educated Patient  Methods Explanation;Demonstration;Tactile cues;Verbal cues;Handout  Comprehension Verbalized understanding;Returned demonstration;Verbal cues required;Tactile cues required;Need further instruction   Access Code: [email or web address] URL: [email or web address] Date: 07/04/2021 Prepared by: Vladimir Fasterobin Eriana Suliman  Exercises - Seated Hamstring Stretch with Strap  - 2 x daily - 7 x weekly - 1 sets - 2-3 reps - 30 seconds hold - Modified Sweeting Stretch  - 2 x daily - 7 x weekly - 1 sets - 2 reps - 30 seconds hold - Supine Dynamic Modified Cribb Quad and Hip Flexor Dynamic Stretch  - 2 x daily - 7 x weekly - 1 sets - 2 reps - 30 seconds hold - Standing posture with back to counter  - 2 x daily - 7 x weekly - 1 sets - 1 reps - 5-10 minutes hold - Upright Stance at Door Frame Single Arm  - 3-6 x daily - 7 x weekly - 1 sets - 2 reps - 2 deep breathes hold - Upright Stance at Door Frame with Both Arms  - 3-6 x daily - 7 x weekly - 1 sets - 2 reps - 2 deep breathes hold - feet together eyes open on floor  - 1 x daily - 4-7 x weekly - 1 sets - 10 reps - 2 seconds hold -  Feet Apart with Eyes Closed with Head Motions  - 1 x daily - 4-7 x weekly - 1 sets - 10 reps - 2 seconds hold - Wide stance on Foam Pad head movements  - 1 x daily - 4-7 x weekly - 1 sets - 10 reps - 2 seconds hold - Standing Quarter Turn with Counter Support  - 1 x daily - 4-7 x weekly - 1 sets - 10 reps - Side Stepping with Counter Support  - 1 x daily - 5-7 x weekly - 1 sets - 10 reps - Backward Walking with Counter Support  - 1 x daily - 5-7 x weekly - 1 sets - 10 reps   06/30/2021 Prosthetic Training with Transfemoral Amputation PT instructed in pumping action of prosthesis with weighting & unweighting and relationship to limb volume changes.  He may need to add 1-3 ply socks during day if he notices increased distal limb pressure. Pt verbalized understanding. PT instructed in large difference in wear & no wear like taking off all day can slow tolerance. Limiting weight bearing more than wear is more effective.  PT reviewed donning socks with proximal folded over socket to minimize migration into socket with wrinkles. PT reviewed how to determine if suspension strap is tight and grip to fully tighten. Pt return demo understanding. Pt verbalized understanding.  Neuromuscular re-ed for upright posture: PT instructed if tightness in hip flexors then residual limb pulls forward into hard socket which may be related to his pain.   PT reviewed 3 exercises  to work on stretching hip flexors & upright posture. Standing back to door frame reaching single & BUEs overhead with 2 deep breathes 2 reps ea. Recommendation is >/=5 times per day as should only take 1-2 min.  Standing back to counter with hands resting on counter. Goal is 5-10 min 2x/day. Supine hooklying with prosthesis over edge of bed. 30 sec hold 2 reps 2x/day.  Pt verbalized & return demo understanding including relationship to limb pain.  Balance with feet hip width apart: tactile & verbal cues for balance reactions.  On floor eyes  open head movement 4 directions 5 reps ea. On floor eyes closed head movement 4 directions 5 reps ea On foam eyes open head movement 4 directions 5 reps ea.   06/28/2021 Prosthetic Training with Transfemoral Amputation Doffed prosthetic leg and examined his distal residual limb due to complaints of pain and no obvious redness or pressure points noted however when donning back on he may be too far down in socket so advised to try using more ply sock to see if this helps as he arrives with only 1 ply sock. Pt navigated 6 inch step ups X8 with Rt leg and down in front leading with left leg in // bars with supervision with verbal cues for technique Pt ambulated 65'  X2 with cane stand alone tip & HHA modA to HHA minA with cues on sequence, upright posture & wt shift over prosthesis in stance.  Neuromuscular Re-ed: Standing on foam pad with RLE intermittent touch on //bars & minA tactile cues from PT: eyes open head turns 10 reps ea 4 directions and static eyes closed 10 sec 5 reps. sidestepping 3 round trips in bars   Therex Leg press DL 16# 1W96, then Rt leg only 50# 2X15 Sit to stands with UE support X 10 throughout session     05/31/21 1426  PT Education  Education Details HEP for posture & hip flexor stretch  Access Code: [email or web address]  Person(s) Educated Patient;Spouse  Methods Explanation;Demonstration;Tactile cues;Verbal cues;Handout  Comprehension Verbalized understanding;Returned demonstration;Tactile cues required;Verbal cues required;Need further instruction   Access Code: [email or web address] URL: [email or web address] Date: 05/31/2021 Prepared by: Vladimir Faster  Exercises - Seated Hamstring Stretch with Strap  - 2 x daily - 7 x weekly - 1 sets - 2-3 reps - 30 seconds hold - Modified Kijowski Stretch  - 2 x daily - 7 x weekly - 1 sets - 2 reps - 30 seconds hold - Supine Dynamic Modified Lehnen Quad and Hip Flexor Dynamic Stretch  - 2 x daily - 7 x weekly - 1 sets - 2 reps - 30  seconds hold - Standing posture with back to counter  - 2 x daily - 7 x weekly - 1 sets - 1 reps - 5-10 minutes hold - Upright Stance at Door Frame Single Arm  - 3-6 x daily - 7 x weekly - 1 sets - 2 reps - 2 deep breathes hold - Upright Stance at Door Frame with Both Arms  - 3-6 x daily - 7 x weekly - 1 sets - 2 reps - 2 deep breathes hold   05/24/21 1151  PT Education  Education Details HEP at sink & residual limb pain vs phantom sensation vs phantom pain  Person(s) Educated Patient  Methods Explanation;Demonstration;Tactile cues;Verbal cues;Handout  Comprehension Verbalized understanding;Returned demonstration;Verbal cues required;Tactile cues required;Need further instruction   ASSESSMENT:   CLINICAL IMPRESSION: Patient's residual limb pain seems to have improved.  PT instructed in HEP for balance  which he appears to understand.  PT worked on facilitating improved weight bearing with BLEs for balance activities both stationary & dynamically. His balance improved with instruction & repetition.     OBJECTIVE IMPAIRMENTS Abnormal gait, decreased activity tolerance, decreased balance, decreased endurance, decreased knowledge of condition, decreased knowledge of use of DME, decreased mobility, decreased ROM, decreased strength, impaired flexibility, postural dysfunction, prosthetic dependency , and pain.    ACTIVITY LIMITATIONS community activity and occupation.    PERSONAL FACTORS Fitness, Time since onset of injury/illness/exacerbation, and 3+ comorbidities: see PMH  are also affecting patient's functional outcome.    REHAB POTENTIAL: Good   CLINICAL DECISION MAKING: Evolving/moderate complexity   EVALUATION COMPLEXITY: Moderate     GOALS: Goals reviewed with patient? Yes   SHORT TERM GOALS: Target date: 07/14/2021   Patient verbalized proper adjustment of ply socks. Goal status: ongoing 2.  Patient tolerates prosthesis >90% awake hrs /day without skin issues or limb pain </=  1/10 after standing / walking. Goal status: ongoing   3.  Patient able to pick up items from floor without UE support with supervision. Goal status: ongoing   4. Patient ambulates 100' with cane stand alone tip & prosthesis with min guard. Goal status: ongoing  LONG TERM GOALS: Target date: 08/17/2021   Patient demonstrates & verbalized understanding of prosthetic care to enable safe utilization of prosthesis. Baseline: SEE OBJECTIVE DATA Goal status: INITIAL   Patient tolerates prosthesis wear >90% of awake hours without skin or limb pain issues. Baseline: SEE OBJECTIVE DATA Goal status: INITIAL   Berg Balance >/= 30/56 to indicate lower fall risk Baseline: SEE OBJECTIVE DATA Goal status: INITIAL   Patient ambulates >500' with LRAD & prosthesis modified independent. Baseline: SEE OBJECTIVE DATA Goal status: INITIAL   Patient negotiates ramps, curbs & stairs with single rail with LRAD & prosthesis modified independent. Baseline: SEE OBJECTIVE DATA Goal status: INITIAL     PLAN: PT FREQUENCY: 2x/week   PT DURATION: 12 weeks   PLANNED INTERVENTIONS: Therapeutic exercises, Therapeutic activity, Neuromuscular re-education, Balance training, Gait training, Patient/Family education, Stair training, Vestibular training, Prosthetic training, and DME instructions   PLAN FOR NEXT SESSION: check on updated HEP, continue prosthetic training with cane & balance activities.      Vladimir Faster, PT, DPT 07/04/2021, 12:51 PM

## 2021-07-06 ENCOUNTER — Ambulatory Visit (INDEPENDENT_AMBULATORY_CARE_PROVIDER_SITE_OTHER): Payer: Medicare Other | Admitting: Physical Therapy

## 2021-07-06 ENCOUNTER — Encounter: Payer: Self-pay | Admitting: Physical Therapy

## 2021-07-06 DIAGNOSIS — R293 Abnormal posture: Secondary | ICD-10-CM

## 2021-07-06 DIAGNOSIS — R2689 Other abnormalities of gait and mobility: Secondary | ICD-10-CM | POA: Diagnosis not present

## 2021-07-06 DIAGNOSIS — R2681 Unsteadiness on feet: Secondary | ICD-10-CM | POA: Diagnosis not present

## 2021-07-06 DIAGNOSIS — M6281 Muscle weakness (generalized): Secondary | ICD-10-CM | POA: Diagnosis not present

## 2021-07-06 NOTE — Therapy (Signed)
OUTPATIENT PHYSICAL THERAPY PROSTHETIC TREATMENT NOTE   Patient Name: Ryan [name] MRN: [id number] DOB:03/31/1950, 71 y.o., male Today's Date: 07/06/2021  PCP: Mirna MiresHill, Gerald, MD REFERRING PROVIDER: Graceann CongressBaglia, Corrina, PA-C       PT End of Session - 07/06/21 0929     Visit Number 15    Number of Visits 25    Date for PT Re-Evaluation 08/17/21    Authorization Type Medicare A/B & Generic commercial    Progress Note Due on Visit 20    PT Start Time 0929    PT Stop Time 1015    PT Time Calculation (min) 46 min    Equipment Utilized During Treatment Gait belt    Activity Tolerance Patient tolerated treatment well    Behavior During Therapy [facility] for tasks assessed/performed                        Past Medical History:  Diagnosis Date   COVID 2022   Hypertension    PVD (peripheral vascular disease) (HCC)    Past Surgical History:  Procedure Laterality Date   ABDOMINAL AORTAGRAM Left 12/13/2016   ABDOMINAL AORTOGRAM W/LOWER EXTREMITY/notes 12/13/2016   ABDOMINAL AORTOGRAM W/LOWER EXTREMITY N/A 11/28/2016   Procedure: ABDOMINAL AORTOGRAM W/LOWER EXTREMITY;  Surgeon: Yates DecampGanji, Jay, MD;  [facility];  Service: Cardiovascular;  Laterality: N/A;  bilateral   ABDOMINAL AORTOGRAM W/LOWER EXTREMITY N/A 12/13/2016   Procedure: ABDOMINAL AORTOGRAM W/LOWER EXTREMITY;  Surgeon: Maeola Harmanain, Brandon Christopher, MD;  [facility];  Service: Cardiovascular;  Laterality: N/A;  unilater left leg   ABDOMINAL AORTOGRAM W/LOWER EXTREMITY N/A 01/09/2018   Procedure: ABDOMINAL AORTOGRAM W/LOWER EXTREMITY;  Surgeon: Cephus Shellinglark, Christopher J, MD;  [facility]: [facility];  Service: Cardiovascular;  Laterality: N/A;   ABDOMINAL AORTOGRAM W/LOWER EXTREMITY Left 09/22/2019   Procedure: ABDOMINAL AORTOGRAM W/LOWER EXTREMITY;  Surgeon: Maeola Harmanain, Brandon Christopher, MD;  [facility];  Service: Cardiovascular;  Laterality: Left;   ABDOMINAL AORTOGRAM W/LOWER  EXTREMITY Left 12/02/2020   Procedure: ABDOMINAL AORTOGRAM W/LOWER EXTREMITY;  Surgeon: Cephus Shellinglark, Christopher J, MD;  [facility];  Service: Cardiovascular;  Laterality: Left;   ABDOMINAL AORTOGRAM W/LOWER EXTREMITY N/A 12/22/2020   Procedure: ABDOMINAL AORTOGRAM W/LOWER EXTREMITY;  Surgeon: Victorino Sparrowobins, Joshua E, MD;  [facility];  Service: Cardiovascular;  Laterality: N/A;   AMPUTATION Left 02/11/2021   Procedure: LEFT ABOVE KNEE AMPUTATION;  Surgeon: Cephus Shellinglark, Christopher J, MD;  [facility];  Service: Vascular;  Laterality: Left;   BYPASS GRAFT POPLITEAL TO TIBIAL Left 12/31/2020   Procedure: LEFT JUMP GRAFT REVISION BELOW KNEE POPLITEAL TO PERONEAL;  Surgeon: Cephus Shellinglark, Christopher J, MD;  [facility];  Service: Vascular;  Laterality: Left;   EMBOLIZATION Left 12/03/2020   Procedure: EMBOLIZATION;  Surgeon: Leonie DouglasHawken, Roarty N, MD;  [facility];  Service: Cardiovascular;  Laterality: Left;   FEMORAL-POPLITEAL BYPASS GRAFT Left 12/14/2016   Procedure: BYPASS GRAFT COMMON FEMORAL- BELOW KNEE POPLITEAL ARTERY with Bovine Patch to Below the knee poplitieal artery.;  Surgeon: Fransisco Hertzhen, Brian L, MD;  [facility];  Service: Vascular;  Laterality: Left;   FEMORAL-POPLITEAL BYPASS GRAFT Left 01/28/2018   Procedure: BYPASS GRAFT FEMORAL-BELOW KNEE POPLITEAL ARTERY REDO with propaten vascular graft removable ring;  Surgeon: Cephus Shellinglark, Christopher J, MD;  [facility]: [facility];  Service: Vascular;  Laterality: Left;   FEMUR SURGERY Right 2009   pt. has a rod in that leg   LOWER EXTREMITY  ANGIOGRAPHY Left 11/28/2016   Procedure: Lower Extremity Angiography;  Surgeon: Yates Decamp, MD;  Location: [facility] INVASIVE CV LAB;  Service: Cardiovascular;  Laterality: Left;  lysis followup lower leg   PATCH ANGIOPLASTY Left 01/28/2018   Procedure: PATCH ANGIOPLASTY USING Livia Snellen BIOLOGIC PATCH;  Surgeon: Cephus Shelling, MD;  Location: [facility] OR;  Service: Vascular;  Laterality: Left;   PATCH  ANGIOPLASTY Left 06/16/2020   Procedure: PATCH ANGIOPLASTY OF LEFT BELOW KNEE POPITEAL ARTERY;  Surgeon: Cephus Shelling, MD;  [facility];  Service: Vascular;  Laterality: Left;   PERIPHERAL VASCULAR BALLOON ANGIOPLASTY  11/28/2016   Procedure: PERIPHERAL VASCULAR BALLOON ANGIOPLASTY;  Surgeon: Yates Decamp, MD;  [facility];  Service: Cardiovascular;;  SFA   PERIPHERAL VASCULAR BALLOON ANGIOPLASTY  12/03/2020   Procedure: PERIPHERAL VASCULAR BALLOON ANGIOPLASTY;  Surgeon: Leonie Douglas, MD;  [facility];  Service: Cardiovascular;;  TP Trunk and Peroneal   PERIPHERAL VASCULAR CATHETERIZATION N/A 08/11/2014   Procedure: Lower Extremity Angiography;  Surgeon: Yates Decamp, MD;  [facility] INVASIVE CV LAB;  Service: Cardiovascular;  Laterality: N/A;   PERIPHERAL VASCULAR INTERVENTION Left 09/22/2019   Procedure: PERIPHERAL VASCULAR INTERVENTION;  Surgeon: Maeola Harman, MD;  Location: [facility] INVASIVE CV LAB;  Service: Cardiovascular;  Laterality: Left;   PERIPHERAL VASCULAR INTERVENTION Left 09/23/2019   Procedure: PERIPHERAL VASCULAR INTERVENTION;  Surgeon: Nada Libman, MD;  [facility];  Service: Cardiovascular;  Laterality: Left;   PERIPHERAL VASCULAR INTERVENTION Left 05/13/2020   Procedure: PERIPHERAL VASCULAR INTERVENTION;  Surgeon: Cephus Shelling, MD;  [facility];  Service: Cardiovascular;  Laterality: Left;   PERIPHERAL VASCULAR INTERVENTION Left 12/02/2020   Procedure: PERIPHERAL VASCULAR INTERVENTION;  Surgeon: Cephus Shelling, MD;  [facility];  Service: Cardiovascular;  Laterality: Left;   PERIPHERAL VASCULAR INTERVENTION  12/03/2020   Procedure: PERIPHERAL VASCULAR INTERVENTION;  Surgeon: Leonie Douglas, MD;  [facility];  Service: Cardiovascular;;   PERIPHERAL VASCULAR INTERVENTION Left 12/22/2020   Procedure: PERIPHERAL VASCULAR INTERVENTION;  Surgeon:  Victorino Sparrow, MD;  [facility] CV LAB;  Service: Cardiovascular;  Laterality: Left;   PERIPHERAL VASCULAR THROMBECTOMY  11/28/2016   Procedure: PERIPHERAL VASCULAR THROMBECTOMY;  Surgeon: Yates Decamp, MD;  [facility];  Service: Cardiovascular;;  Profunda   PERIPHERAL VASCULAR THROMBECTOMY  11/28/2016   Procedure: PERIPHERAL VASCULAR THROMBECTOMY;  Surgeon: Yates Decamp, MD;  [facility];  Service: Cardiovascular;;   PERIPHERAL VASCULAR THROMBECTOMY N/A 05/12/2020   Procedure: PERIPHERAL VASCULAR THROMBECTOMY-Left Leg;  Surgeon: Leonie Douglas, MD;  [facility];  Service: Cardiovascular;  Laterality: N/A;   PERIPHERAL VASCULAR THROMBECTOMY N/A 05/13/2020   Procedure: PERIPHERAL VASCULAR THROMBECTOMY- LYSIS RECHECK;  Surgeon: Cephus Shelling, MD;  [facility];  Service: Cardiovascular;  Laterality: N/A;   PERIPHERAL VASCULAR THROMBECTOMY N/A 12/02/2020   Procedure: PERIPHERAL VASCULAR THROMBECTOMY;  Surgeon: Cephus Shelling, MD;  [facility];  Service: Cardiovascular;  Laterality: N/A;   PERIPHERAL VASCULAR THROMBECTOMY N/A 12/03/2020   Procedure: LYSIS RECHECK;  Surgeon: Leonie Douglas, MD;  [facility];  Service: Cardiovascular;  Laterality: N/A;   PULMONARY THROMBECTOMY N/A 09/23/2019   Procedure: LYSIS RECHECK;  Surgeon: Nada Libman, MD;  [facility];  Service: Cardiovascular;  Laterality: N/A;   THROMBECTOMY FEMORAL ARTERY Left 12/14/2016   Procedure: THROMBECTOMY PROFUNDA FEMORAL  ARTERY;  Surgeon: Fransisco Hertz, MD;  Location: [facility] OR;  Service: Vascular;  Laterality: Left;   THROMBECTOMY FEMORAL ARTERY Left 06/16/2020   Procedure: Redo exposure ot Left below knee popiteal artery;  Surgeon: Cephus Shelling, MD;  Location[facility] OR;  Service: Vascular;  Laterality: Left;   THROMBECTOMY OF BYPASS GRAFT FEMORAL- POPLITEAL ARTERY Left 06/16/2020    Procedure: THROMBECTOMY OF BYPASS GRAFT FEMORAL-POPLITEAL ARTERY and TIBIAL ARTERY;  Surgeon: Cephus Shelling, MD;  [facility];  Service: Vascular;  Laterality: Left;   THROMBECTOMY OF BYPASS GRAFT FEMORAL- POPLITEAL ARTERY Left 12/31/2020   Procedure: THROMBECTOMY OF LEFT COMMON FEMORAL TO BELOW KNEE POPLITEAL ARTERY BYPASS GRAFT;  Surgeon: Cephus Shelling, MD;  [facility]: [facility];  Service: Vascular;  Laterality: Left;   VEIN HARVEST Left 12/31/2020   Procedure: Left Greater Saphenous Vein Harvest;  Surgeon: Cephus Shelling, MD;  Location: [facility] OR;  Service: Vascular;  Laterality: Left;   Patient Active Problem List   Diagnosis Date Noted   Peripheral artery disease (HCC) 12/31/2020   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   Thrombosis of femoro-popliteal bypass graft (HCC) 05/12/2020   Critical lower limb ischemia (HCC) 12/11/2016   Claudication in peripheral vascular disease (HCC) 11/28/2016   HTN (hypertension) 10/09/2014   Tobacco use 10/09/2014   PAD (peripheral artery disease) (HCC)     REFERRING DIAG: 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  The prosthesis still feels well and wearing all awake hours.   PAIN:  Are you having pain?  Yes: NPRS scale: arrived to PT 0/10.  At night is awaken with pain up to 3/10 Pain location: distal residual limb Pain description: cramping when he wakes up Aggravating factors: unknown Relieving factors: wearing prosthesis  OBJECTIVE:    POSTURE: 05/19/21:  rounded shoulders, forward head, flexed trunk , and weight shift right   LE ROM:   ROM P:passive  A:active Right 05/19/2021  Left 05/19/2021  Hip flexion      Hip extension   -15* standing  Hip abduction      Hip adduction      Hip internal rotation      Hip external rotation      Knee flexion      Knee extension      Ankle dorsiflexion      Ankle plantarflexion      Ankle inversion      Ankle eversion       (Blank rows = not tested) MMT:   MMT Right 05/19/2021 Left 05/19/2021  Hip flexion      Hip extension   3/5 Gross functional  Hip abduction   3/5 Gross functional  Hip adduction      Hip internal rotation      Hip external rotation      Knee flexion      Knee extension      Ankle dorsiflexion      Ankle plantarflexion      Ankle inversion      Ankle eversion      (Blank rows = not tested)   TRANSFERS: Sit to stand: 05/19/21:  SBA and requires UE assist with armrests & RW touch to stabilize  Stand to sit: 05/19/21:  SBA and requires UE assist with armrests & RW stabilization   GAIT: 05/19/21:  Gait pattern: step to pattern, decreased step length- Right, decreased stance time- Left, decreased hip/knee flexion- Left, circumduction- Left, Left hip hike, antalgic, trunk flexed, abducted- Left, and poor foot clearance- Left  Distance walked: 81' Assistive device utilized: Environmental consultant - 2 wheeled and TFA prosthesis  excessive UE weight bearing Level of assistance: SBA   FUNCTIONAL TESTs:  Berg Balance Scale: 05/19/21:  17/56   [facility] PT Assessment - 05/19/21 0900                Standardized Balance Assessment    Standardized Balance Assessment Berg Balance Test          Berg Balance Test    Sit to Stand Needs minimal aid to stand or to stabilize     Standing Unsupported Able to stand 2 minutes with supervision     Sitting with Back Unsupported but Feet Supported on Floor or Stool Able to sit safely and securely 2 minutes     Stand to Sit Controls descent by using hands     Transfers Able to transfer safely, definite need of hands     Standing Unsupported with Eyes Closed Unable to keep eyes  closed 3 seconds but stays steady     Standing Unsupported with Feet Together Needs help to attain position but able to stand for 30 seconds with feet together     From Standing, Reach Forward with Outstretched Arm Reaches forward but needs supervision     From Standing Position, Pick up Object from Floor Unable to try/needs assist to keep balance     From Standing Position, Turn to Look Behind Over each Shoulder Needs assist to keep from losing balance and falling     Turn 360 Degrees Needs assistance while turning     Standing Unsupported, Alternately Place Feet on Step/Stool Needs assistance to keep from falling or unable to try     Standing Unsupported, One Foot in [employer] balance while stepping or standing     Standing on One Leg Unable to try or needs assist to prevent fall     Total Score 17     Berg comment: BERG  < 36 high risk for falls (close to 100%) 46-51 moderate (>50%)   37-45 significant (>80%) 52-55 lower (> 25%)                   CURRENT PROSTHETIC WEAR ASSESSMENT: 05/19/21:  Patient is dependent with: skin check, residual limb care, care of non-amputated limb, prosthetic cleaning, ply sock cleaning, correct ply sock adjustment, proper wear schedule/adjustment, and proper weight-bearing schedule/adjustment Donning prosthesis: SBA Doffing prosthesis: Modified independence Prosthetic wear tolerance: 5-6 hours/day, 16 of 16 days since delivery.  Prosthetic weight bearing tolerance: 5 minutes with partial weight on prosthesis & intermittent support with RW Residual limb condition: 2 invaginated areas with one not clean, 1 small area on incision that may be suture working way out, dry skin, cylinderical shape, nonpitting edema,  Prosthetic description: silicon liner with velcro lanyard, ischial containment socket with flexible inner liner, SAFETY knee (single axis nonhydraulic), K2 flexible keel foot     TODAY'S TREATMENT:  07/06/2021 Prosthetic Training with Transfemoral  Prosthesis: Pt ambulated 80' with cane stand alone tip initially with HHA then progressed to no support LUE. He requires modA to minA. Verbal & tactile cues on upright posture, initial contact timing with heel, wt  shift over prosthesis in stance. Pt amb inside //bar with cane support 8' X 5 with min guard. Short cautious steps. Verbal cues for above.  Neuromuscular Re-ed: Initially at sink: worked on proper technique & sequencing to turn 90* right & left progressing from sink support to cane support (requires intermittent touch or minA).  Progressed to turning 90* inside //bars with cane & minA. Sit to/from stand from chairs without armrests to no device to stabilize & PT not stabalizing chair.   Therapeutic Exercise: Nustep seat 8 level 6 with BLEs & BUEs for 5 min  07/04/2021 Prosthetic Training with Transfemoral Prosthesis: Pt ambulated 80' X 2 with cane stand alone tip initially with HHA then progressed to no support LUE. He requires modA to minA. Verbal & tactile cues on upright posture, initial contact timing with heel, wt shift over prosthesis in stance.  Therapeutic Exercise: Nustep seat 8 level 6 with BLEs & BUEs for 5 min  Neuromuscular Re-ed:  Balance facilitating visual, proprioception & vestibular systems: tactile & verbal cues for balance reactions.  On floor with feet together eyes open head movement 4 directions 5 reps ea. On floor with feet hip width apart: eyes closed head movement 4 directions 5 reps ea min guard On foam with feet hip width apart: eyes open head movement 4 directions 5 reps ea minA  Updated HEP for balance.     07/04/21 1250  PT Education  Education Details Access Code: [email or web address]  Person(s) Educated Patient  Methods Explanation;Demonstration;Tactile cues;Verbal cues;Handout  Comprehension Verbalized understanding;Returned demonstration;Verbal cues required;Tactile cues required;Need further instruction   Access Code: [email or web address] URL:  [email or web address] Date: 07/04/2021 Prepared by: Vladimir Faster  Exercises - Seated Hamstring Stretch with Strap  - 2 x daily - 7 x weekly - 1 sets - 2-3 reps - 30 seconds hold - Modified [place]uk Stretch  - 2 x daily - 7 x weekly - 1 sets - 2 reps - 30 seconds hold - Supine Dynamic Modified Rodenberg Quad and Hip Flexor Dynamic Stretch  - 2 x daily - 7 x weekly - 1 sets - 2 reps - 30 seconds hold - Standing posture with back to counter  - 2 x daily - 7 x weekly - 1 sets - 1 reps - 5-10 minutes hold - Upright Stance at Door Frame Single Arm  - 3-6 x daily - 7 x weekly - 1 sets - 2 reps - 2 deep breathes hold - Upright Stance at Door Frame with Both Arms  - 3-6 x daily - 7 x weekly - 1 sets - 2 reps - 2 deep breathes hold - feet together eyes open on floor  - 1 x daily - 4-7 x weekly - 1 sets - 10 reps - 2 seconds hold - Feet Apart with Eyes Closed with Head Motions  - 1 x daily - 4-7 x weekly - 1 sets - 10 reps - 2 seconds hold - Wide stance on Foam Pad head movements  - 1 x daily - 4-7 x weekly - 1 sets - 10 reps - 2 seconds hold - Standing Quarter Turn with Counter Support  - 1 x daily - 4-7 x weekly - 1 sets - 10 reps - Side Stepping with Counter Support  - 1 x daily - 5-7 x weekly - 1 sets - 10 reps - Backward Walking with Counter Support  - 1 x daily - 5-7 x weekly - 1 sets - 10 reps   06/30/2021 Prosthetic Training with Transfemoral  Amputation PT instructed in pumping action of prosthesis with weighting & unweighting and relationship to limb volume changes.  He may need to add 1-3 ply socks during day if he notices increased distal limb pressure. Pt verbalized understanding. PT instructed in large difference in wear & no wear like taking off all day can slow tolerance. Limiting weight bearing more than wear is more effective.  PT reviewed donning socks with proximal folded over socket to minimize migration into socket with wrinkles. PT reviewed how to determine if suspension  strap is tight and grip to fully tighten. Pt return demo understanding. Pt verbalized understanding.  Neuromuscular re-ed for upright posture: PT instructed if tightness in hip flexors then residual limb pulls forward into hard socket which may be related to his pain.   PT reviewed 3 exercises to work on stretching hip flexors & upright posture. Standing back to door frame reaching single & BUEs overhead with 2 deep breathes 2 reps ea. Recommendation is >/=5 times per day as should only take 1-2 min.  Standing back to counter with hands resting on counter. Goal is 5-10 min 2x/day. Supine hooklying with prosthesis over edge of bed. 30 sec hold 2 reps 2x/day.  Pt verbalized & return demo understanding including relationship to limb pain.  Balance with feet hip width apart: tactile & verbal cues for balance reactions.  On floor eyes open head movement 4 directions 5 reps ea. On floor eyes closed head movement 4 directions 5 reps ea On foam eyes open head movement 4 directions 5 reps ea.   06/28/2021 Prosthetic Training with Transfemoral Amputation Doffed prosthetic leg and examined his distal residual limb due to complaints of pain and no obvious redness or pressure points noted however when donning back on he may be too far down in socket so advised to try using more ply sock to see if this helps as he arrives with only 1 ply sock. Pt navigated 6 inch step ups X8 with Rt leg and down in front leading with left leg in // bars with supervision with verbal cues for technique Pt ambulated 65'  X2 with cane stand alone tip & HHA modA to HHA minA with cues on sequence, upright posture & wt shift over prosthesis in stance.  Neuromuscular Re-ed: Standing on foam pad with RLE intermittent touch on //bars & minA tactile cues from PT: eyes open head turns 10 reps ea 4 directions and static eyes closed 10 sec 5 reps. sidestepping 3 round trips in bars   Therex Leg press DL 94# 7S96, then Rt leg only 50#  2X15 Sit to stands with UE support X 10 throughout session     05/31/21 1426  PT Education  Education Details HEP for posture & hip flexor stretch  Access Code: [email or web address]  Person(s) Educated Patient;Spouse  Methods Explanation;Demonstration;Tactile cues;Verbal cues;Handout  Comprehension Verbalized understanding;Returned demonstration;Tactile cues required;Verbal cues required;Need further instruction   Access Code: [email or web address] URL: [email or web address] Date: 05/31/2021 Prepared by: Vladimir Faster  Exercises - Seated Hamstring Stretch with Strap  - 2 x daily - 7 x weekly - 1 sets - 2-3 reps - 30 seconds hold - Modified Christner Stretch  - 2 x daily - 7 x weekly - 1 sets - 2 reps - 30 seconds hold - Supine Dynamic Modified [place] Quad and Hip Flexor Dynamic Stretch  - 2 x daily - 7 x weekly - 1 sets - 2 reps - 30 seconds hold - Standing posture with back to counter  -  2 x daily - 7 x weekly - 1 sets - 1 reps - 5-10 minutes hold - Upright Stance at Door Frame Single Arm  - 3-6 x daily - 7 x weekly - 1 sets - 2 reps - 2 deep breathes hold - Upright Stance at Door Frame with Both Arms  - 3-6 x daily - 7 x weekly - 1 sets - 2 reps - 2 deep breathes hold   05/24/21 1151  PT Education  Education Details HEP at sink & residual limb pain vs phantom sensation vs phantom pain  Person(s) Educated Patient  Methods Explanation;Demonstration;Tactile cues;Verbal cues;Handout  Comprehension Verbalized understanding;Returned demonstration;Verbal cues required;Tactile cues required;Need further instruction   ASSESSMENT:   CLINICAL IMPRESSION: PT worked on turning 90* right & left which he improved but still needs more skilled care to be functional without fall risk.  Pt is progressing towards STGs & appears on target.  He continues to benefit from skilled care to improve function & safety with his Transfemoral prosthesis.    OBJECTIVE IMPAIRMENTS Abnormal gait, decreased activity tolerance,  decreased balance, decreased endurance, decreased knowledge of condition, decreased knowledge of use of DME, decreased mobility, decreased ROM, decreased strength, impaired flexibility, postural dysfunction, prosthetic dependency , and pain.    ACTIVITY LIMITATIONS community activity and occupation.    PERSONAL FACTORS Fitness, Time since onset of injury/illness/exacerbation, and 3+ comorbidities: see PMH  are also affecting patient's functional outcome.    REHAB POTENTIAL: Good   CLINICAL DECISION MAKING: Evolving/moderate complexity   EVALUATION COMPLEXITY: Moderate     GOALS: Goals reviewed with patient? Yes   SHORT TERM GOALS: Target date: 07/14/2021   Patient verbalized proper adjustment of ply socks. Goal status: ongoing 2.  Patient tolerates prosthesis >90% awake hrs /day without skin issues or limb pain </= 1/10 after standing / walking. Goal status: ongoing   3.  Patient able to pick up items from floor without UE support with supervision. Goal status: ongoing   4. Patient ambulates 100' with cane stand alone tip & prosthesis with min guard. Goal status: ongoing  LONG TERM GOALS: Target date: 08/17/2021   Patient demonstrates & verbalized understanding of prosthetic care to enable safe utilization of prosthesis. Baseline: SEE OBJECTIVE DATA Goal status: INITIAL   Patient tolerates prosthesis wear >90% of awake hours without skin or limb pain issues. Baseline: SEE OBJECTIVE DATA Goal status: INITIAL   Berg Balance >/= 30/56 to indicate lower fall risk Baseline: SEE OBJECTIVE DATA Goal status: INITIAL   Patient ambulates >500' with LRAD & prosthesis modified independent. Baseline: SEE OBJECTIVE DATA Goal status: INITIAL   Patient negotiates ramps, curbs & stairs with single rail with LRAD & prosthesis modified independent. Baseline: SEE OBJECTIVE DATA Goal status: INITIAL     PLAN: PT FREQUENCY: 2x/week   PT DURATION: 12 weeks   PLANNED INTERVENTIONS:  Therapeutic exercises, Therapeutic activity, Neuromuscular re-education, Balance training, Gait training, Patient/Family education, Stair training, Vestibular training, Prosthetic training, and DME instructions   PLAN FOR NEXT SESSION: continue prosthetic training with cane & balance activities.      Vladimir Faster, PT, DPT 07/06/2021, 10:51 AM

## 2021-07-08 ENCOUNTER — Other Ambulatory Visit: Payer: Self-pay | Admitting: *Deleted

## 2021-07-08 NOTE — Patient Outreach (Signed)
[facility]) Care Management  07/08/2021  Kaio Kuhlman 1950/02/28 [id number]  Successful telephone outreach call to patient for screening. HIPAA identifiers obtained. Nurse reviewed [facility] program and services with patient. Patient stated that he did not feel like the services would benefit him at this time. Patient did state that he would like a pamphlet to be mailed to him and he would contact [facility] if he needed the services in the future.  Plan: RN Health Coach will send successful letter and [facility] Pamphlet  Blanchie Serve RN, BSN [facility] Care Management  RN Health Coach [phone] [email or web address]

## 2021-07-12 ENCOUNTER — Ambulatory Visit (INDEPENDENT_AMBULATORY_CARE_PROVIDER_SITE_OTHER): Payer: Medicare Other | Admitting: Physical Therapy

## 2021-07-12 ENCOUNTER — Encounter: Payer: Self-pay | Admitting: Physical Therapy

## 2021-07-12 DIAGNOSIS — R2681 Unsteadiness on feet: Secondary | ICD-10-CM | POA: Diagnosis not present

## 2021-07-12 DIAGNOSIS — R293 Abnormal posture: Secondary | ICD-10-CM | POA: Diagnosis not present

## 2021-07-12 DIAGNOSIS — M6281 Muscle weakness (generalized): Secondary | ICD-10-CM

## 2021-07-12 DIAGNOSIS — R2689 Other abnormalities of gait and mobility: Secondary | ICD-10-CM

## 2021-07-12 NOTE — Therapy (Signed)
OUTPATIENT PHYSICAL THERAPY PROSTHETIC TREATMENT NOTE   Patient Name: Ryan [name] MRN: [id number] DOB:04-15-1950, 71 y.o., male Today's Date: 07/12/2021  PCP: Iona Beard, MD REFERRING PROVIDER: Karoline Caldwell, PA-C       PT End of Session - 07/12/21 0936     Visit Number 16    Number of Visits 25    Date for PT Re-Evaluation 08/17/21    Authorization Type Medicare A/B & Generic commercial    Progress Note Due on Visit 20    PT Start Time 0930    PT Stop Time 1015    PT Time Calculation (min) 45 min    Equipment Utilized During Treatment Gait belt    Activity Tolerance Patient tolerated treatment well    Behavior During Therapy WFL for tasks assessed/performed                         Past Medical History:  Diagnosis Date   COVID 2022   Hypertension    PVD (peripheral vascular disease) ([place])    Past Surgical History:  Procedure Laterality Date   ABDOMINAL AORTAGRAM Left 12/13/2016   ABDOMINAL AORTOGRAM W/LOWER EXTREMITY/notes 12/13/2016   ABDOMINAL AORTOGRAM W/LOWER EXTREMITY N/A 11/28/2016   Procedure: ABDOMINAL AORTOGRAM W/LOWER EXTREMITY;  Surgeon: Adrian Prows, MD;  Location: [place] [facility];  Service: Cardiovascular;  Laterality: N/A;  bilateral   ABDOMINAL AORTOGRAM W/LOWER EXTREMITY N/A 12/13/2016   Procedure: ABDOMINAL AORTOGRAM W/LOWER EXTREMITY;  Surgeon: Waynetta Sandy, MD;  Location: [place] [facility];  Service: Cardiovascular;  Laterality: N/A;  unilater left leg   ABDOMINAL AORTOGRAM W/LOWER EXTREMITY N/A 01/09/2018   Procedure: ABDOMINAL AORTOGRAM W/LOWER EXTREMITY;  Surgeon: Marty Heck, MD;  Location: [place] [facility];  Service: Cardiovascular;  Laterality: N/A;   ABDOMINAL AORTOGRAM W/LOWER EXTREMITY Left 09/22/2019   Procedure: ABDOMINAL AORTOGRAM W/LOWER EXTREMITY;  Surgeon: Waynetta Sandy, MD;  Location: [place] [facility];  Service: Cardiovascular;  Laterality: Left;   ABDOMINAL AORTOGRAM W/LOWER  EXTREMITY Left 12/02/2020   Procedure: ABDOMINAL AORTOGRAM W/LOWER EXTREMITY;  Surgeon: Marty Heck, MD;  Location: [place] [facility];  Service: Cardiovascular;  Laterality: Left;   ABDOMINAL AORTOGRAM W/LOWER EXTREMITY N/A 12/22/2020   Procedure: ABDOMINAL AORTOGRAM W/LOWER EXTREMITY;  Surgeon: Broadus John, MD;  Location: [place] [facility];  Service: Cardiovascular;  Laterality: N/A;   AMPUTATION Left 02/11/2021   Procedure: LEFT ABOVE KNEE AMPUTATION;  Surgeon: Marty Heck, MD;  Location: [place];  Service: Vascular;  Laterality: Left;   BYPASS GRAFT POPLITEAL TO TIBIAL Left 12/31/2020   Procedure: LEFT JUMP GRAFT REVISION BELOW KNEE POPLITEAL TO PERONEAL;  Surgeon: Marty Heck, MD;  Location: [place];  Service: Vascular;  Laterality: Left;   EMBOLIZATION Left 12/03/2020   Procedure: EMBOLIZATION;  Surgeon: Cherre Robins, MD;  Location: [place] [facility];  Service: Cardiovascular;  Laterality: Left;   FEMORAL-POPLITEAL BYPASS GRAFT Left 12/14/2016   Procedure: BYPASS GRAFT COMMON FEMORAL- BELOW KNEE POPLITEAL ARTERY with Bovine Patch to Below the knee poplitieal artery.;  Surgeon: Conrad Morningside, MD;  Location: [facility];  Service: Vascular;  Laterality: Left;   FEMORAL-POPLITEAL BYPASS GRAFT Left 01/28/2018   Procedure: BYPASS GRAFT FEMORAL-BELOW KNEE POPLITEAL ARTERY REDO with propaten vascular graft removable ring;  Surgeon: Marty Heck, MD;  Location: [place];  Service: Vascular;  Laterality: Left;   FEMUR SURGERY Right 2009   pt. has a rod in that leg   LOWER  EXTREMITY ANGIOGRAPHY Left 11/28/2016   Procedure: Lower Extremity Angiography;  Surgeon: Adrian Prows, MD;  Location: [place] [facility];  Service: Cardiovascular;  Laterality: Left;  lysis followup lower leg   PATCH ANGIOPLASTY Left 01/28/2018   Procedure: PATCH ANGIOPLASTY USING Rueben Bash BIOLOGIC PATCH;  Surgeon: Marty Heck, MD;  Location: [place];  Service: Vascular;  Laterality: Left;   PATCH  ANGIOPLASTY Left 06/16/2020   Procedure: PATCH ANGIOPLASTY OF LEFT BELOW KNEE [place] ARTERY;  Surgeon: Marty Heck, MD;  Location: [place];  Service: Vascular;  Laterality: Left;   PERIPHERAL VASCULAR BALLOON ANGIOPLASTY  11/28/2016   Procedure: PERIPHERAL VASCULAR BALLOON ANGIOPLASTY;  Surgeon: Adrian Prows, MD;  Location: [place] [facility];  Service: Cardiovascular;;  SFA   PERIPHERAL VASCULAR BALLOON ANGIOPLASTY  12/03/2020   Procedure: PERIPHERAL VASCULAR BALLOON ANGIOPLASTY;  Surgeon: Cherre Robins, MD;  Location: [place] [facility];  Service: Cardiovascular;;  TP Trunk and Peroneal   PERIPHERAL VASCULAR CATHETERIZATION N/A 08/11/2014   Procedure: Lower Extremity Angiography;  Surgeon: Adrian Prows, MD;  Location: [place] [facility];  Service: Cardiovascular;  Laterality: N/A;   PERIPHERAL VASCULAR INTERVENTION Left 09/22/2019   Procedure: PERIPHERAL VASCULAR INTERVENTION;  Surgeon: Waynetta Sandy, MD;  Location: [place] [facility];  Service: Cardiovascular;  Laterality: Left;   PERIPHERAL VASCULAR INTERVENTION Left 09/23/2019   Procedure: PERIPHERAL VASCULAR INTERVENTION;  Surgeon: Serafina Mitchell, MD;  Location: [place] [facility];  Service: Cardiovascular;  Laterality: Left;   PERIPHERAL VASCULAR INTERVENTION Left 05/13/2020   Procedure: PERIPHERAL VASCULAR INTERVENTION;  Surgeon: Marty Heck, MD;  Location: [place] [facility];  Service: Cardiovascular;  Laterality: Left;   PERIPHERAL VASCULAR INTERVENTION Left 12/02/2020   Procedure: PERIPHERAL VASCULAR INTERVENTION;  Surgeon: Marty Heck, MD;  Location: [place] [facility];  Service: Cardiovascular;  Laterality: Left;   PERIPHERAL VASCULAR INTERVENTION  12/03/2020   Procedure: PERIPHERAL VASCULAR INTERVENTION;  Surgeon: Cherre Robins, MD;  Location: [place] [facility];  Service: Cardiovascular;;   PERIPHERAL VASCULAR INTERVENTION Left 12/22/2020   Procedure: PERIPHERAL VASCULAR INTERVENTION;  Surgeon:  Broadus John, MD;  Location: [place] [facility];  Service: Cardiovascular;  Laterality: Left;   PERIPHERAL VASCULAR THROMBECTOMY  11/28/2016   Procedure: PERIPHERAL VASCULAR THROMBECTOMY;  Surgeon: Adrian Prows, MD;  Location: [place] [facility];  Service: Cardiovascular;;  Profunda   PERIPHERAL VASCULAR THROMBECTOMY  11/28/2016   Procedure: PERIPHERAL VASCULAR THROMBECTOMY;  Surgeon: Adrian Prows, MD;  Location: [place] [facility];  Service: Cardiovascular;;   PERIPHERAL VASCULAR THROMBECTOMY N/A 05/12/2020   Procedure: PERIPHERAL VASCULAR THROMBECTOMY-Left Leg;  Surgeon: Cherre Robins, MD;  Location: [place] [facility];  Service: Cardiovascular;  Laterality: N/A;   PERIPHERAL VASCULAR THROMBECTOMY N/A 05/13/2020   Procedure: PERIPHERAL VASCULAR THROMBECTOMY- LYSIS RECHECK;  Surgeon: Marty Heck, MD;  Location: [place] [facility];  Service: Cardiovascular;  Laterality: N/A;   PERIPHERAL VASCULAR THROMBECTOMY N/A 12/02/2020   Procedure: PERIPHERAL VASCULAR THROMBECTOMY;  Surgeon: Marty Heck, MD;  Location: [place] [facility];  Service: Cardiovascular;  Laterality: N/A;   PERIPHERAL VASCULAR THROMBECTOMY N/A 12/03/2020   Procedure: LYSIS RECHECK;  Surgeon: Cherre Robins, MD;  Location: [place] [facility];  Service: Cardiovascular;  Laterality: N/A;   PULMONARY THROMBECTOMY N/A 09/23/2019   Procedure: LYSIS RECHECK;  Surgeon: Serafina Mitchell, MD;  Location: [place] [facility];  Service: Cardiovascular;  Laterality: N/A;   THROMBECTOMY FEMORAL ARTERY Left 12/14/2016   Procedure: THROMBECTOMY PROFUNDA  FEMORAL ARTERY;  Surgeon: Conrad Chevak, MD;  Location: [place];  Service: Vascular;  Laterality: Left;   THROMBECTOMY FEMORAL ARTERY Left 06/16/2020   Procedure: Redo exposure ot Left below knee popiteal artery;  Surgeon: Marty Heck, MD;  Location: [facility] OR;  Service: Vascular;  Laterality: Left;   THROMBECTOMY OF BYPASS GRAFT FEMORAL- POPLITEAL ARTERY Left 06/16/2020    Procedure: THROMBECTOMY OF BYPASS GRAFT FEMORAL-POPLITEAL ARTERY and TIBIAL ARTERY;  Surgeon: Marty Heck, MD;  Location: [place];  Service: Vascular;  Laterality: Left;   THROMBECTOMY OF BYPASS GRAFT FEMORAL- POPLITEAL ARTERY Left 12/31/2020   Procedure: THROMBECTOMY OF LEFT COMMON FEMORAL TO BELOW KNEE POPLITEAL ARTERY BYPASS GRAFT;  Surgeon: Marty Heck, MD;  Location: [place];  Service: Vascular;  Laterality: Left;   VEIN HARVEST Left 12/31/2020   Procedure: Left Greater Saphenous Vein Harvest;  Surgeon: Marty Heck, MD;  Location: [place];  Service: Vascular;  Laterality: Left;   Patient Active Problem List   Diagnosis Date Noted   Peripheral artery disease ([place]) 12/31/2020   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[place]) 05/12/2020   Thrombosis of femoro-popliteal bypass graft ([place]) 05/12/2020   Critical lower limb ischemia ([place]) 12/11/2016   Claudication in peripheral vascular disease ([place]) 11/28/2016   HTN (hypertension) 10/09/2014   Tobacco use 10/09/2014   PAD (peripheral artery disease) ([place])     REFERRING DIAG: [id number] Left Transfemoral Amputation, 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  His limb also itches a lot.   PAIN:  Are you having pain?  Yes: NPRS scale: arrived to PT 3/10 when walking & aches 1/10 .  At night is awaken with pain up to 3/10 Pain location: distal residual limb Pain description: cramping when he wakes up, soreness walking & aches when sitting with prosthesis. Aggravating factors: walking with sudden increases in time Relieving factors: wearing prosthesis  OBJECTIVE:    POSTURE: 05/19/21:  rounded shoulders, forward  head, flexed trunk , and weight shift right   LE ROM:   ROM P:passive  A:active Right 05/19/2021 Left 05/19/2021  Hip flexion      Hip extension   -15* standing  Hip abduction      Hip adduction      Hip internal rotation      Hip external rotation      Knee flexion      Knee extension      Ankle dorsiflexion      Ankle plantarflexion      Ankle inversion      Ankle eversion       (Blank rows = not tested) MMT:   MMT Right 05/19/2021 Left 05/19/2021  Hip flexion      Hip extension   3/5 Gross functional  Hip abduction   3/5 Gross functional  Hip adduction      Hip internal rotation      Hip external rotation      Knee flexion      Knee extension      Ankle dorsiflexion      Ankle plantarflexion      Ankle inversion      Ankle eversion      (Blank  rows = not tested)   TRANSFERS: Sit to stand: 05/19/21:  SBA and requires UE assist with armrests & RW touch to stabilize Stand to sit: 05/19/21:  SBA and requires UE assist with armrests & RW stabilization   GAIT: 05/19/21:  Gait pattern: step to pattern, decreased step length- Right, decreased stance time- Left, decreased hip/knee flexion- Left, circumduction- Left, Left hip hike, antalgic, trunk flexed, abducted- Left, and poor foot clearance- Left  Distance walked: 83' Assistive device utilized: Environmental consultant - 2 wheeled and TFA prosthesis  excessive UE weight bearing Level of assistance: SBA   FUNCTIONAL TESTs:  Berg Balance Scale: 05/19/21:  17/56   [facility] PT Assessment - 05/19/21 0900                Standardized Balance Assessment    Standardized Balance Assessment Berg Balance Test          Berg Balance Test    Sit to Stand Needs minimal aid to stand or to stabilize     Standing Unsupported Able to stand 2 minutes with supervision     Sitting with Back Unsupported but Feet Supported on Floor or Stool Able to sit safely and securely 2 minutes     Stand to Sit Controls descent by using hands     Transfers Able to  transfer safely, definite need of hands     Standing Unsupported with Eyes Closed Unable to keep eyes closed 3 seconds but stays steady     Standing Unsupported with Feet Together Needs help to attain position but able to stand for 30 seconds with feet together     From Standing, Reach Forward with Outstretched Arm Reaches forward but needs supervision     From Standing Position, Pick up Object from Floor Unable to try/needs assist to keep balance     From Standing Position, Turn to Look Behind Over each Shoulder Needs assist to keep from losing balance and falling     Turn 360 Degrees Needs assistance while turning     Standing Unsupported, Alternately Place Feet on Step/Stool Needs assistance to keep from falling or unable to try     Standing Unsupported, One Foot in [employer] balance while stepping or standing     Standing on One Leg Unable to try or needs assist to prevent fall     Total Score 17     Berg comment: BERG  < 36 high risk for falls (close to 100%) 46-51 moderate (>50%)   37-45 significant (>80%) 52-55 lower (> 25%)                   CURRENT PROSTHETIC WEAR ASSESSMENT: 05/19/21:  Patient is dependent with: skin check, residual limb care, care of non-amputated limb, prosthetic cleaning, ply sock cleaning, correct ply sock adjustment, proper wear schedule/adjustment, and proper weight-bearing schedule/adjustment Donning prosthesis: SBA Doffing prosthesis: Modified independence Prosthetic wear tolerance: 5-6 hours/day, 16 of 16 days since delivery.  Prosthetic weight bearing tolerance: 5 minutes with partial weight on prosthesis & intermittent support with RW Residual limb condition: 2 invaginated areas with one not clean, 1 small area on incision that may be suture working way out, dry skin, cylinderical shape, nonpitting edema,  Prosthetic description: silicon liner with velcro lanyard, ischial containment socket with flexible inner liner, SAFETY knee (single axis  nonhydraulic), K2 flexible keel foot     TODAY'S TREATMENT:  07/12/2021 Prosthetic Training with Transfemoral Prosthesis: PT instructed in sweating with prosthesis. PT recommended use of antiperspirant  on limb after morning shower and to clean limb during showering & at end of day. PT reviewed proper cleaning of liner. Issues with sweat trapped in liner may be itching that he feels and also increases risk of skin issues if he does not address.   Increasing your activity level is important.  Short distances which is walking from one room to another. Work to increase frequency back to prior level.  Medium distances are entering & exiting your home or community with limited distances. Start with 4 medium walks which is one outing to one location and increase number of tolerated amounts per day.  Long distance is your highest tolerance for you. Walk until you feel you must rest. Back or leg pain or general fatigue are indicators to maximum tolerance. Monitor by distance or time. Try to walk your BEST distance 1-2 times per day. You should see this increase over time.   Pt verbalized understanding.  PT reviewed with demo proper orientation for rotation of socket & fully tightening strap. PT educated in adjusting ply socks.  Pt verbalized understanding.  Limb pressure & pain may be related to increasing weight bearing too quickly, incorrect socks, strap not tight &/or socket rotated. Pt verbalized understanding.   Therapeutic Exercise: Nustep seat 8 level 7 with BLEs & BUEs for 8 min  07/06/2021 Prosthetic Training with Transfemoral Prosthesis: Pt ambulated 80' with cane stand alone tip initially with HHA then progressed to no support LUE. He requires modA to [place]. Verbal & tactile cues on upright posture, initial contact timing with heel, wt shift over prosthesis in stance. Pt amb inside //bar with cane support 8' X 5 with min guard. Short cautious steps. Verbal cues for above.  Neuromuscular  Re-ed: Initially at sink: worked on proper technique & sequencing to turn 90* right & left progressing from sink support to cane support (requires intermittent touch or minA).  Progressed to turning 90* inside //bars with cane & minA. Sit to/from stand from chairs without armrests to no device to stabilize & PT not stabalizing chair.   Therapeutic Exercise: Nustep seat 8 level 6 with BLEs & BUEs for 5 min  07/04/2021 Prosthetic Training with Transfemoral Prosthesis: Pt ambulated 80' X 2 with cane stand alone tip initially with HHA then progressed to no support LUE. He requires modA to [place]. Verbal & tactile cues on upright posture, initial contact timing with heel, wt shift over prosthesis in stance.  Therapeutic Exercise: Nustep seat 8 level 6 with BLEs & BUEs for 5 min  Neuromuscular Re-ed:  Balance facilitating visual, proprioception & vestibular systems: tactile & verbal cues for balance reactions.  On floor with feet together eyes open head movement 4 directions 5 reps ea. On floor with feet hip width apart: eyes closed head movement 4 directions 5 reps ea min guard On foam with feet hip width apart: eyes open head movement 4 directions 5 reps ea minA    07/04/21 1250  PT Education  Education Details Access Code: [email or web address]  Person(s) Educated Patient  Methods Explanation;Demonstration;Tactile cues;Verbal cues;Handout  Comprehension Verbalized understanding;Returned demonstration;Verbal cues required;Tactile cues required;Need further instruction   Access Code: [email or web address] URL: [email or web address] Date: 07/04/2021 Prepared by: Jamey Reas  Exercises - Seated Hamstring Stretch with Strap  - 2 x daily - 7 x weekly - 1 sets - 2-3 reps - 30 seconds hold - Modified Bahr Stretch  - 2 x daily - 7 x weekly - 1 sets - 2 reps - 30 seconds hold -  Supine Dynamic Modified Karl Pock and Hip Flexor Dynamic Stretch  - 2 x daily - 7 x weekly - 1 sets - 2 reps - 30 seconds  hold - Standing posture with back to counter  - 2 x daily - 7 x weekly - 1 sets - 1 reps - 5-10 minutes hold - Upright Stance at Door Frame Single Arm  - 3-6 x daily - 7 x weekly - 1 sets - 2 reps - 2 deep breathes hold - Upright Stance at Door Frame with Both Arms  - 3-6 x daily - 7 x weekly - 1 sets - 2 reps - 2 deep breathes hold - feet together eyes open on floor  - 1 x daily - 4-7 x weekly - 1 sets - 10 reps - 2 seconds hold - Feet Apart with Eyes Closed with Head Motions  - 1 x daily - 4-7 x weekly - 1 sets - 10 reps - 2 seconds hold - Wide stance on Foam Pad head movements  - 1 x daily - 4-7 x weekly - 1 sets - 10 reps - 2 seconds hold - Standing Quarter Turn with Counter Support  - 1 x daily - 4-7 x weekly - 1 sets - 10 reps - Side Stepping with Counter Support  - 1 x daily - 5-7 x weekly - 1 sets - 10 reps - Backward Walking with Counter Support  - 1 x daily - 5-7 x weekly - 1 sets - 10 reps    05/31/21 1426  PT Education  Education Details HEP for posture & hip flexor stretch  Access Code: [email or web address]  Person(s) Educated Patient;Spouse  Methods Explanation;Demonstration;Tactile cues;Verbal cues;Handout  Comprehension Verbalized understanding;Returned demonstration;Tactile cues required;Verbal cues required;Need further instruction   Access Code: [email or web address] URL: [email or web address] Date: 05/31/2021 Prepared by: Jamey Reas  Exercises - Seated Hamstring Stretch with Strap  - 2 x daily - 7 x weekly - 1 sets - 2-3 reps - 30 seconds hold - Modified [place] Stretch  - 2 x daily - 7 x weekly - 1 sets - 2 reps - 30 seconds hold - Supine Dynamic Modified [place] Quad and Hip Flexor Dynamic Stretch  - 2 x daily - 7 x weekly - 1 sets - 2 reps - 30 seconds hold - Standing posture with back to counter  - 2 x daily - 7 x weekly - 1 sets - 1 reps - 5-10 minutes hold - Upright Stance at Door Frame Single Arm  - 3-6 x daily - 7 x weekly - 1 sets - 2 reps - 2 deep breathes hold -  Upright Stance at Door Frame with Both Arms  - 3-6 x daily - 7 x weekly - 1 sets - 2 reps - 2 deep breathes hold   05/24/21 1151  PT Education  Education Details HEP at sink & residual limb pain vs phantom sensation vs phantom pain  Person(s) Educated Patient  Methods Explanation;Demonstration;Tactile cues;Verbal cues;Handout  Comprehension Verbalized understanding;Returned demonstration;Verbal cues required;Tactile cues required;Need further instruction   ASSESSMENT:   CLINICAL IMPRESSION: PT updated prosthetic care education today to address issues with itching & limb soreness. He appears to have better understanding.    OBJECTIVE IMPAIRMENTS Abnormal gait, decreased activity tolerance, decreased balance, decreased endurance, decreased knowledge of condition, decreased knowledge of use of DME, decreased mobility, decreased ROM, decreased strength, impaired flexibility, postural dysfunction, prosthetic dependency , and pain.    ACTIVITY LIMITATIONS community activity and occupation.    PERSONAL FACTORS  Fitness, Time since onset of injury/illness/exacerbation, and 3+ comorbidities: see PMH  are also affecting patient's functional outcome.    REHAB POTENTIAL: Good   CLINICAL DECISION MAKING: Evolving/moderate complexity   EVALUATION COMPLEXITY: Moderate     GOALS: Goals reviewed with patient? Yes   SHORT TERM GOALS: Target date: 07/14/2021   Patient verbalized proper adjustment of ply socks. Goal status: ongoing 2.  Patient tolerates prosthesis >90% awake hrs /day without skin issues or limb pain </= 1/10 after standing / walking. Goal status: ongoing   3.  Patient able to pick up items from floor without UE support with supervision. Goal status: ongoing   4. Patient ambulates 100' with cane stand alone tip & prosthesis with min guard. Goal status: ongoing  LONG TERM GOALS: Target date: 08/17/2021   Patient demonstrates & verbalized understanding of prosthetic care to enable  safe utilization of prosthesis. Baseline: SEE OBJECTIVE DATA Goal status: INITIAL   Patient tolerates prosthesis wear >90% of awake hours without skin or limb pain issues. Baseline: SEE OBJECTIVE DATA Goal status: INITIAL   Berg Balance >/= 30/56 to indicate lower fall risk Baseline: SEE OBJECTIVE DATA Goal status: INITIAL   Patient ambulates >500' with LRAD & prosthesis modified independent. Baseline: SEE OBJECTIVE DATA Goal status: INITIAL   Patient negotiates ramps, curbs & stairs with single rail with LRAD & prosthesis modified independent. Baseline: SEE OBJECTIVE DATA Goal status: INITIAL     PLAN: PT FREQUENCY: 2x/week   PT DURATION: 12 weeks   PLANNED INTERVENTIONS: Therapeutic exercises, Therapeutic activity, Neuromuscular re-education, Balance training, Gait training, Patient/Family education, Stair training, Vestibular training, Prosthetic training, and DME instructions   PLAN FOR NEXT SESSION: check STGs.      Jamey Reas, PT, DPT 07/12/2021, 3:58 PM

## 2021-07-14 ENCOUNTER — Ambulatory Visit (INDEPENDENT_AMBULATORY_CARE_PROVIDER_SITE_OTHER): Payer: Medicare Other | Admitting: Physical Therapy

## 2021-07-14 ENCOUNTER — Encounter: Payer: Self-pay | Admitting: Physical Therapy

## 2021-07-14 DIAGNOSIS — R293 Abnormal posture: Secondary | ICD-10-CM | POA: Diagnosis not present

## 2021-07-14 DIAGNOSIS — M6281 Muscle weakness (generalized): Secondary | ICD-10-CM

## 2021-07-14 DIAGNOSIS — R2681 Unsteadiness on feet: Secondary | ICD-10-CM

## 2021-07-14 DIAGNOSIS — R2689 Other abnormalities of gait and mobility: Secondary | ICD-10-CM | POA: Diagnosis not present

## 2021-07-14 NOTE — Therapy (Signed)
OUTPATIENT PHYSICAL THERAPY PROSTHETIC TREATMENT NOTE   Patient Name: Ryan [name] MRN: [id number] DOB:20-Jan-1951, 71 y.o., male Today's Date: 07/14/2021  PCP: Mirna Mires, MD REFERRING PROVIDER: Graceann Congress, PA-C       PT End of Session - 07/14/21 0935     Visit Number 17    Number of Visits 25    Date for PT Re-Evaluation 08/17/21    Authorization Type Medicare A/B & Generic commercial    Progress Note Due on Visit 20    PT Start Time 0930    PT Stop Time 1013    PT Time Calculation (min) 43 min    Equipment Utilized During Treatment Gait belt    Activity Tolerance Patient tolerated treatment well    Behavior During Therapy WFL for tasks assessed/performed                          Past Medical History:  Diagnosis Date   COVID 2022   Hypertension    PVD (peripheral vascular disease) (HCC)    Past Surgical History:  Procedure Laterality Date   ABDOMINAL AORTAGRAM Left 12/13/2016   ABDOMINAL AORTOGRAM W/LOWER EXTREMITY/notes 12/13/2016   ABDOMINAL AORTOGRAM W/LOWER EXTREMITY N/A 11/28/2016   Procedure: ABDOMINAL AORTOGRAM W/LOWER EXTREMITY;  Surgeon: Yates Decamp, MD;  Location: [facility];  Service: Cardiovascular;  Laterality: N/A;  bilateral   ABDOMINAL AORTOGRAM W/LOWER EXTREMITY N/A 12/13/2016   Procedure: ABDOMINAL AORTOGRAM W/LOWER EXTREMITY;  Surgeon: Maeola Harman, MD;  Location: [facility] INVASIVE CV LAB;  Service: Cardiovascular;  Laterality: N/A;  unilater left leg   ABDOMINAL AORTOGRAM W/LOWER EXTREMITY N/A 01/09/2018   Procedure: ABDOMINAL AORTOGRAM W/LOWER EXTREMITY;  Surgeon: Cephus Shelling, MD;  Location: [facility];  Service: Cardiovascular;  Laterality: N/A;   ABDOMINAL AORTOGRAM W/LOWER EXTREMITY Left 09/22/2019   Procedure: ABDOMINAL AORTOGRAM W/LOWER EXTREMITY;  Surgeon: Maeola Harman, MD;  Location: [facility] INVASIVE CV LAB;  Service: Cardiovascular;  Laterality: Left;   ABDOMINAL AORTOGRAM  W/LOWER EXTREMITY Left 12/02/2020   Procedure: ABDOMINAL AORTOGRAM W/LOWER EXTREMITY;  Surgeon: Cephus Shelling, MD;  Location: [facility];  Service: Cardiovascular;  Laterality: Left;   ABDOMINAL AORTOGRAM W/LOWER EXTREMITY N/A 12/22/2020   Procedure: ABDOMINAL AORTOGRAM W/LOWER EXTREMITY;  Surgeon: Victorino Sparrow, MD;  Location: [facility] INVASIVE CV LAB;  Service: Cardiovascular;  Laterality: N/A;   AMPUTATION Left 02/11/2021   Procedure: LEFT ABOVE KNEE AMPUTATION;  Surgeon: Cephus Shelling, MD;  Location: [facility] OR;  Service: Vascular;  Laterality: Left;   BYPASS GRAFT POPLITEAL TO TIBIAL Left 12/31/2020   Procedure: LEFT JUMP GRAFT REVISION BELOW KNEE POPLITEAL TO PERONEAL;  Surgeon: Cephus Shelling, MD;  Location: [facility] OR;  Service: Vascular;  Laterality: Left;   EMBOLIZATION Left 12/03/2020   Procedure: EMBOLIZATION;  Surgeon: Leonie Douglas, MD;  Location: [facility];  Service: Cardiovascular;  Laterality: Left;   FEMORAL-POPLITEAL BYPASS GRAFT Left 12/14/2016   Procedure: BYPASS GRAFT COMMON FEMORAL- BELOW KNEE POPLITEAL ARTERY with Bovine Patch to Below the knee poplitieal artery.;  Surgeon: Fransisco Hertz, MD;  Location: [facility] OR;  Service: Vascular;  Laterality: Left;   FEMORAL-POPLITEAL BYPASS GRAFT Left 01/28/2018   Procedure: BYPASS GRAFT FEMORAL-BELOW KNEE POPLITEAL ARTERY REDO with propaten vascular graft removable ring;  Surgeon: Cephus Shelling, MD;  Location: [facility] OR;  Service: Vascular;  Laterality: Left;   FEMUR SURGERY Right 2009   pt. has a rod in that leg  LOWER EXTREMITY ANGIOGRAPHY Left 11/28/2016   Procedure: Lower Extremity Angiography;  Surgeon: Adrian Prows, MD;  Location: [place] CV LAB;  Service: Cardiovascular;  Laterality: Left;  lysis followup lower leg   PATCH ANGIOPLASTY Left 01/28/2018   Procedure: PATCH ANGIOPLASTY USING Rueben Bash BIOLOGIC PATCH;  Surgeon: Marty Heck, MD;  Location: [place];  Service: Vascular;  Laterality: Left;    PATCH ANGIOPLASTY Left 06/16/2020   Procedure: PATCH ANGIOPLASTY OF LEFT BELOW KNEE [place] ARTERY;  Surgeon: Marty Heck, MD;  Location: [place];  Service: Vascular;  Laterality: Left;   PERIPHERAL VASCULAR BALLOON ANGIOPLASTY  11/28/2016   Procedure: PERIPHERAL VASCULAR BALLOON ANGIOPLASTY;  Surgeon: Adrian Prows, MD;  Location: [place] CV LAB;  Service: Cardiovascular;;  SFA   PERIPHERAL VASCULAR BALLOON ANGIOPLASTY  12/03/2020   Procedure: PERIPHERAL VASCULAR BALLOON ANGIOPLASTY;  Surgeon: Cherre Robins, MD;  Location: [place] CV LAB;  Service: Cardiovascular;;  TP Trunk and Peroneal   PERIPHERAL VASCULAR CATHETERIZATION N/A 08/11/2014   Procedure: Lower Extremity Angiography;  Surgeon: Adrian Prows, MD;  Location: [place] CV LAB;  Service: Cardiovascular;  Laterality: N/A;   PERIPHERAL VASCULAR INTERVENTION Left 09/22/2019   Procedure: PERIPHERAL VASCULAR INTERVENTION;  Surgeon: Waynetta Sandy, MD;  Location: [place] [facility] LAB;  Service: Cardiovascular;  Laterality: Left;   PERIPHERAL VASCULAR INTERVENTION Left 09/23/2019   Procedure: PERIPHERAL VASCULAR INTERVENTION;  Surgeon: Serafina Mitchell, MD;  Location: [place] [facility] LAB;  Service: Cardiovascular;  Laterality: Left;   PERIPHERAL VASCULAR INTERVENTION Left 05/13/2020   Procedure: PERIPHERAL VASCULAR INTERVENTION;  Surgeon: Marty Heck, MD;  Location: [place] [facility] LAB;  Service: Cardiovascular;  Laterality: Left;   PERIPHERAL VASCULAR INTERVENTION Left 12/02/2020   Procedure: PERIPHERAL VASCULAR INTERVENTION;  Surgeon: Marty Heck, MD;  Location: [place] [facility] LAB;  Service: Cardiovascular;  Laterality: Left;   PERIPHERAL VASCULAR INTERVENTION  12/03/2020   Procedure: PERIPHERAL VASCULAR INTERVENTION;  Surgeon: Cherre Robins, MD;  Location: [place] [facility] LAB;  Service: Cardiovascular;;   PERIPHERAL VASCULAR INTERVENTION Left 12/22/2020   Procedure: PERIPHERAL VASCULAR INTERVENTION;   Surgeon: Broadus John, MD;  Location: [place] [facility] LAB;  Service: Cardiovascular;  Laterality: Left;   PERIPHERAL VASCULAR THROMBECTOMY  11/28/2016   Procedure: PERIPHERAL VASCULAR THROMBECTOMY;  Surgeon: Adrian Prows, MD;  Location: [place] [facility] LAB;  Service: Cardiovascular;;  Profunda   PERIPHERAL VASCULAR THROMBECTOMY  11/28/2016   Procedure: PERIPHERAL VASCULAR THROMBECTOMY;  Surgeon: Adrian Prows, MD;  Location: [place] [facility] LAB;  Service: Cardiovascular;;   PERIPHERAL VASCULAR THROMBECTOMY N/A 05/12/2020   Procedure: PERIPHERAL VASCULAR THROMBECTOMY-Left Leg;  Surgeon: Cherre Robins, MD;  Location: [place] CV LAB;  Service: Cardiovascular;  Laterality: N/A;   PERIPHERAL VASCULAR THROMBECTOMY N/A 05/13/2020   Procedure: PERIPHERAL VASCULAR THROMBECTOMY- LYSIS RECHECK;  Surgeon: Marty Heck, MD;  Location: [place] [facility] LAB;  Service: Cardiovascular;  Laterality: N/A;   PERIPHERAL VASCULAR THROMBECTOMY N/A 12/02/2020   Procedure: PERIPHERAL VASCULAR THROMBECTOMY;  Surgeon: Marty Heck, MD;  Location: [place] [facility];  Service: Cardiovascular;  Laterality: N/A;   PERIPHERAL VASCULAR THROMBECTOMY N/A 12/03/2020   Procedure: LYSIS RECHECK;  Surgeon: Cherre Robins, MD;  Location: [place] [facility] LAB;  Service: Cardiovascular;  Laterality: N/A;   PULMONARY THROMBECTOMY N/A 09/23/2019   Procedure: LYSIS RECHECK;  Surgeon: Serafina Mitchell, MD;  Location: [place] [facility] LAB;  Service: Cardiovascular;  Laterality: N/A;   THROMBECTOMY FEMORAL ARTERY Left 12/14/2016   Procedure: THROMBECTOMY  PROFUNDA FEMORAL ARTERY;  Surgeon: Conrad Baxter, MD;  Location: [place];  Service: Vascular;  Laterality: Left;   THROMBECTOMY FEMORAL ARTERY Left 06/16/2020   Procedure: Redo exposure ot Left below knee popiteal artery;  Surgeon: Marty Heck, MD;  Location: [facility] OR;  Service: Vascular;  Laterality: Left;   THROMBECTOMY OF BYPASS GRAFT FEMORAL- POPLITEAL ARTERY Left 06/16/2020    Procedure: THROMBECTOMY OF BYPASS GRAFT FEMORAL-POPLITEAL ARTERY and TIBIAL ARTERY;  Surgeon: Marty Heck, MD;  Location: [place];  Service: Vascular;  Laterality: Left;   THROMBECTOMY OF BYPASS GRAFT FEMORAL- POPLITEAL ARTERY Left 12/31/2020   Procedure: THROMBECTOMY OF LEFT COMMON FEMORAL TO BELOW KNEE POPLITEAL ARTERY BYPASS GRAFT;  Surgeon: Marty Heck, MD;  Location: [place];  Service: Vascular;  Laterality: Left;   VEIN HARVEST Left 12/31/2020   Procedure: Left Greater Saphenous Vein Harvest;  Surgeon: Marty Heck, MD;  Location: [place];  Service: Vascular;  Laterality: Left;   Patient Active Problem List   Diagnosis Date Noted   Peripheral artery disease ([place]) 12/31/2020   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   Thrombosis of femoro-popliteal bypass graft ([place]) 05/12/2020   Critical lower limb ischemia ([place]) 12/11/2016   Claudication in peripheral vascular disease ([place]) 11/28/2016   HTN (hypertension) 10/09/2014   Tobacco use 10/09/2014   PAD (peripheral artery disease) ([place])     REFERRING DIAG: 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  He has to put his prosthesis on limb 3-5 times sometimes to get it right.  The itching in his limb is better but his leg is still so sore that it is hard to put weight on it.   PAIN:  Are you having pain?  Yes: NPRS scale: arrived to PT  5/10 when walking & aches 3/10 sitting .  At night is awaken with pain up to 3/10 Pain location: distal residual limb Pain description: cramping when he wakes up, soreness walking & aches when  sitting with prosthesis. Aggravating factors: walking with sudden increases in time Relieving factors: wearing prosthesis  OBJECTIVE:    POSTURE: 05/19/21:  rounded shoulders, forward head, flexed trunk , and weight shift right   LE ROM:   ROM P:passive  A:active Right 05/19/2021 Left 05/19/2021  Hip flexion      Hip extension   -15* standing  Hip abduction      Hip adduction      Hip internal rotation      Hip external rotation      Knee flexion      Knee extension      Ankle dorsiflexion      Ankle plantarflexion      Ankle inversion      Ankle eversion       (Blank rows = not tested) MMT:   MMT Right 05/19/2021 Left 05/19/2021  Hip flexion      Hip extension   3/5 Gross functional  Hip abduction   3/5 Gross functional  Hip adduction      Hip internal rotation      Hip external rotation      Knee flexion  Knee extension      Ankle dorsiflexion      Ankle plantarflexion      Ankle inversion      Ankle eversion      (Blank rows = not tested)   TRANSFERS: Sit to stand: 05/19/21:  SBA and requires UE assist with armrests & RW touch to stabilize Stand to sit: 05/19/21:  SBA and requires UE assist with armrests & RW stabilization   GAIT: 05/19/21:  Gait pattern: step to pattern, decreased step length- Right, decreased stance time- Left, decreased hip/knee flexion- Left, circumduction- Left, Left hip hike, antalgic, trunk flexed, abducted- Left, and poor foot clearance- Left  Distance walked: 61' Assistive device utilized: Environmental consultant - 2 wheeled and TFA prosthesis  excessive UE weight bearing Level of assistance: SBA   FUNCTIONAL TESTs:  Berg Balance Scale: 05/19/21:  17/56   [facility] PT Assessment - 05/19/21 0900                Standardized Balance Assessment    Standardized Balance Assessment Berg Balance Test          Berg Balance Test    Sit to Stand Needs minimal aid to stand or to stabilize     Standing Unsupported Able to stand 2 minutes with supervision      Sitting with Back Unsupported but Feet Supported on Floor or Stool Able to sit safely and securely 2 minutes     Stand to Sit Controls descent by using hands     Transfers Able to transfer safely, definite need of hands     Standing Unsupported with Eyes Closed Unable to keep eyes closed 3 seconds but stays steady     Standing Unsupported with Feet Together Needs help to attain position but able to stand for 30 seconds with feet together     From Standing, Reach Forward with Outstretched Arm Reaches forward but needs supervision     From Standing Position, Pick up Object from Floor Unable to try/needs assist to keep balance     From Standing Position, Turn to Look Behind Over each Shoulder Needs assist to keep from losing balance and falling     Turn 360 Degrees Needs assistance while turning     Standing Unsupported, Alternately Place Feet on Step/Stool Needs assistance to keep from falling or unable to try     Standing Unsupported, One Foot in [employer] balance while stepping or standing     Standing on One Leg Unable to try or needs assist to prevent fall     Total Score 17     Berg comment: BERG  < 36 high risk for falls (close to 100%) 46-51 moderate (>50%)   37-45 significant (>80%) 52-55 lower (> 25%)                   CURRENT PROSTHETIC WEAR ASSESSMENT: 05/19/21:  Patient is dependent with: skin check, residual limb care, care of non-amputated limb, prosthetic cleaning, ply sock cleaning, correct ply sock adjustment, proper wear schedule/adjustment, and proper weight-bearing schedule/adjustment Donning prosthesis: SBA Doffing prosthesis: Modified independence Prosthetic wear tolerance: 5-6 hours/day, 16 of 16 days since delivery.  Prosthetic weight bearing tolerance: 5 minutes with partial weight on prosthesis & intermittent support with RW Residual limb condition: 2 invaginated areas with one not clean, 1 small area on incision that may be suture working way out, dry skin,  cylinderical shape, nonpitting edema,  Prosthetic description: silicon liner with velcro lanyard, ischial containment socket with  flexible inner liner, SAFETY knee (single axis nonhydraulic), K2 flexible keel foot     TODAY'S TREATMENT:  07/14/2021 Prosthetic Training with Transfemoral Prosthesis: For phantom pain, PT recommended to minimize time that his limb is "naked" (without prosthesis or shrinker).  When removing prosthesis have everything handy. So he can quickly wipe down his leg & donne shrinker.  Pt verbalized understanding.  PT demo & verbal cues on donning liner so correct orientation of suspension strap and proper donning socks to minimize wrinkles.  PT demo & verbal cues for proper rotation of socket with knee bent initially in sitting then slight toe-out in standing. PT reviewed adjusting ply socks (based on how deep limb seats into socket NOT padding).  PT educated on salt intake relation to limb volume and ply socks. Pt was too far out of socket with 1-ply sock. PT removed sock and limb was able to seat deeper into socket but still not as completely as should. Pt reported on way out of clinic that limb felt better but was sore still. His limb had some soreness from improper socks & donning that may take a few days to subside.   PT reviewed need to tight suspension strap in standing frequently during the day.  If strap loosens with his limb seating deeper into socket with weight bearing activities, then his limb will piston and irritate his limb.  Pt verbalized understanding of above.  PT demo & verbal cues to not rotate right heel under midline as causes LEs to hit in swing &/or abduct prosthesis.    07/12/2021 Prosthetic Training with Transfemoral Prosthesis: PT instructed in sweating with prosthesis. PT recommended use of antiperspirant on limb after morning shower and to clean limb during showering & at end of day. PT reviewed proper cleaning of liner. Issues with sweat trapped in  liner may be itching that he feels and also increases risk of skin issues if he does not address.   Increasing your activity level is important.  Short distances which is walking from one room to another. Work to increase frequency back to prior level.  Medium distances are entering & exiting your home or community with limited distances. Start with 4 medium walks which is one outing to one location and increase number of tolerated amounts per day.  Long distance is your highest tolerance for you. Walk until you feel you must rest. Back or leg pain or general fatigue are indicators to maximum tolerance. Monitor by distance or time. Try to walk your BEST distance 1-2 times per day. You should see this increase over time.   Pt verbalized understanding.  PT reviewed with demo proper orientation for rotation of socket & fully tightening strap. PT educated in adjusting ply socks.  Pt verbalized understanding.  Limb pressure & pain may be related to increasing weight bearing too quickly, incorrect socks, strap not tight &/or socket rotated. Pt verbalized understanding.   Therapeutic Exercise: Nustep seat 8 level 7 with BLEs & BUEs for 8 min  07/06/2021 Prosthetic Training with Transfemoral Prosthesis: Pt ambulated 80' with cane stand alone tip initially with HHA then progressed to no support LUE. He requires modA to [place]. Verbal & tactile cues on upright posture, initial contact timing with heel, wt shift over prosthesis in stance. Pt amb inside //bar with cane support 8' X 5 with min guard. Short cautious steps. Verbal cues for above.  Neuromuscular Re-ed: Initially at sink: worked on Dietitian & sequencing to turn 90* right & left  progressing from sink support to cane support (requires intermittent touch or minA).  Progressed to turning 90* inside //bars with cane & minA. Sit to/from stand from chairs without armrests to no device to stabilize & PT not stabalizing chair.   Therapeutic  Exercise: Nustep seat 8 level 6 with BLEs & BUEs for 5 min     07/04/21 1250  PT Education  Education Details Access Code: [email or web address]  Person(s) Educated Patient  Methods Explanation;Demonstration;Tactile cues;Verbal cues;Handout  Comprehension Verbalized understanding;Returned demonstration;Verbal cues required;Tactile cues required;Need further instruction   Access Code: [email or web address] URL: [email or web address] Date: 07/04/2021 Prepared by: Jamey Reas  Exercises - Seated Hamstring Stretch with Strap  - 2 x daily - 7 x weekly - 1 sets - 2-3 reps - 30 seconds hold - Modified [place] Stretch  - 2 x daily - 7 x weekly - 1 sets - 2 reps - 30 seconds hold - Supine Dynamic Modified [place] Quad and Hip Flexor Dynamic Stretch  - 2 x daily - 7 x weekly - 1 sets - 2 reps - 30 seconds hold - Standing posture with back to counter  - 2 x daily - 7 x weekly - 1 sets - 1 reps - 5-10 minutes hold - Upright Stance at Door Frame Single Arm  - 3-6 x daily - 7 x weekly - 1 sets - 2 reps - 2 deep breathes hold - Upright Stance at Door Frame with Both Arms  - 3-6 x daily - 7 x weekly - 1 sets - 2 reps - 2 deep breathes hold - feet together eyes open on floor  - 1 x daily - 4-7 x weekly - 1 sets - 10 reps - 2 seconds hold - Feet Apart with Eyes Closed with Head Motions  - 1 x daily - 4-7 x weekly - 1 sets - 10 reps - 2 seconds hold - Wide stance on Foam Pad head movements  - 1 x daily - 4-7 x weekly - 1 sets - 10 reps - 2 seconds hold - Standing Quarter Turn with Counter Support  - 1 x daily - 4-7 x weekly - 1 sets - 10 reps - Side Stepping with Counter Support  - 1 x daily - 5-7 x weekly - 1 sets - 10 reps - Backward Walking with Counter Support  - 1 x daily - 5-7 x weekly - 1 sets - 10 reps    05/31/21 1426  PT Education  Education Details HEP for posture & hip flexor stretch  Access Code: [email or web address]  Person(s) Educated Patient;Spouse  Methods Explanation;Demonstration;Tactile cues;Verbal  cues;Handout  Comprehension Verbalized understanding;Returned demonstration;Tactile cues required;Verbal cues required;Need further instruction   Access Code: [email or web address] URL: [email or web address] Date: 05/31/2021 Prepared by: Jamey Reas  Exercises - Seated Hamstring Stretch with Strap  - 2 x daily - 7 x weekly - 1 sets - 2-3 reps - 30 seconds hold - Modified Levels Stretch  - 2 x daily - 7 x weekly - 1 sets - 2 reps - 30 seconds hold - Supine Dynamic Modified [place] Quad and Hip Flexor Dynamic Stretch  - 2 x daily - 7 x weekly - 1 sets - 2 reps - 30 seconds hold - Standing posture with back to counter  - 2 x daily - 7 x weekly - 1 sets - 1 reps - 5-10 minutes hold - Upright Stance at Door Frame Single Arm  - 3-6 x daily - 7 x weekly - 1 sets - 2 reps - 2  deep breathes hold - Upright Stance at Door Frame with Both Arms  - 3-6 x daily - 7 x weekly - 1 sets - 2 reps - 2 deep breathes hold   05/24/21 1151  PT Education  Education Details HEP at sink & residual limb pain vs phantom sensation vs phantom pain  Person(s) Educated Patient  Methods Explanation;Demonstration;Tactile cues;Verbal cues;Handout  Comprehension Verbalized understanding;Returned demonstration;Verbal cues required;Tactile cues required;Need further instruction   ASSESSMENT:   CLINICAL IMPRESSION: Pt continues to have soreness in residual limb that is limiting his ability to bear weight thru prosthesis.  His daughter was present today to observe PT instructions.  Pt & dtr appear to have better understanding of issues noted above that effect his limb pain.     OBJECTIVE IMPAIRMENTS Abnormal gait, decreased activity tolerance, decreased balance, decreased endurance, decreased knowledge of condition, decreased knowledge of use of DME, decreased mobility, decreased ROM, decreased strength, impaired flexibility, postural dysfunction, prosthetic dependency , and pain.    ACTIVITY LIMITATIONS community activity  and occupation.    PERSONAL FACTORS Fitness, Time since onset of injury/illness/exacerbation, and 3+ comorbidities: see PMH  are also affecting patient's functional outcome.    REHAB POTENTIAL: Good   CLINICAL DECISION MAKING: Evolving/moderate complexity   EVALUATION COMPLEXITY: Moderate     GOALS: Goals reviewed with patient? Yes   SHORT TERM GOALS: Target date: 07/14/2021   Patient verbalized proper adjustment of ply socks. Goal status: reviewed 07/14/21 - ongoing.  2.  Patient tolerates prosthesis >90% awake hrs /day without skin issues or limb pain </= 1/10 after standing / walking. Goal status: 6/15/223 met for time & no skin issues but ongoing for limb pain.    3.  Patient able to pick up items from floor without UE support with supervision. Goal status: ongoing 07/12/2021   4. Patient ambulates 100' with cane stand alone tip & prosthesis with min guard. Goal status: oongoing 07/12/2021  LONG TERM GOALS: Target date: 08/17/2021   Patient demonstrates & verbalized understanding of prosthetic care to enable safe utilization of prosthesis. Baseline: SEE OBJECTIVE DATA Goal status: INITIAL   Patient tolerates prosthesis wear >90% of awake hours without skin or limb pain issues. Baseline: SEE OBJECTIVE DATA Goal status: INITIAL   Berg Balance >/= 30/56 to indicate lower fall risk Baseline: SEE OBJECTIVE DATA Goal status: INITIAL   Patient ambulates >500' with LRAD & prosthesis modified independent. Baseline: SEE OBJECTIVE DATA Goal status: INITIAL   Patient negotiates ramps, curbs & stairs with single rail with LRAD & prosthesis modified independent. Baseline: SEE OBJECTIVE DATA Goal status: INITIAL     PLAN: PT FREQUENCY: 2x/week   PT DURATION: 12 weeks   PLANNED INTERVENTIONS: Therapeutic exercises, Therapeutic activity, Neuromuscular re-education, Balance training, Gait training, Patient/Family education, Stair training, Vestibular training, Prosthetic training,  and DME instructions   PLAN FOR NEXT SESSION: check if limb pain improved with PT instructions & recommendations,  work towards LTGs including balance & gait with cane.      Jamey Reas, PT, DPT 07/14/2021, 12:58 PM

## 2021-07-19 ENCOUNTER — Encounter: Payer: Self-pay | Admitting: Physical Therapy

## 2021-07-19 ENCOUNTER — Ambulatory Visit (INDEPENDENT_AMBULATORY_CARE_PROVIDER_SITE_OTHER): Payer: Medicare Other | Admitting: Physical Therapy

## 2021-07-19 DIAGNOSIS — R2681 Unsteadiness on feet: Secondary | ICD-10-CM | POA: Diagnosis not present

## 2021-07-19 DIAGNOSIS — M6281 Muscle weakness (generalized): Secondary | ICD-10-CM

## 2021-07-19 DIAGNOSIS — R2689 Other abnormalities of gait and mobility: Secondary | ICD-10-CM | POA: Diagnosis not present

## 2021-07-19 DIAGNOSIS — R293 Abnormal posture: Secondary | ICD-10-CM

## 2021-07-19 NOTE — Therapy (Signed)
OUTPATIENT PHYSICAL THERAPY PROSTHETIC TREATMENT NOTE   Patient Name: Ryan [name] MRN: [id number] DOB:1950/05/17, 71 y.o., male Today's Date: 07/19/2021  PCP: Mirna Mires, MD REFERRING PROVIDER: Graceann Congress, PA-C       PT End of Session - 07/19/21 0915     Visit Number 18    Number of Visits 25    Date for PT Re-Evaluation 08/17/21    Authorization Type Medicare A/B & Generic commercial    Progress Note Due on Visit 20    PT Start Time 0845    PT Stop Time 0925    PT Time Calculation (min) 40 min    Equipment Utilized During Treatment Gait belt    Activity Tolerance Patient tolerated treatment well    Behavior During Therapy [facility] for tasks assessed/performed                          Past Medical History:  Diagnosis Date   COVID 2022   Hypertension    PVD (peripheral vascular disease) (HCC)    Past Surgical History:  Procedure Laterality Date   ABDOMINAL AORTAGRAM Left 12/13/2016   ABDOMINAL AORTOGRAM W/LOWER EXTREMITY/notes 12/13/2016   ABDOMINAL AORTOGRAM W/LOWER EXTREMITY N/A 11/28/2016   Procedure: ABDOMINAL AORTOGRAM W/LOWER EXTREMITY;  Surgeon: Yates Decamp, MD;  Location: [facility] INVASIVE CV LAB;  Service: Cardiovascular;  Laterality: N/A;  bilateral   ABDOMINAL AORTOGRAM W/LOWER EXTREMITY N/A 12/13/2016   Procedure: ABDOMINAL AORTOGRAM W/LOWER EXTREMITY;  Surgeon: Maeola Harman, MD;  Location: [facility] INVASIVE CV LAB;  Service: Cardiovascular;  Laterality: N/A;  unilater left leg   ABDOMINAL AORTOGRAM W/LOWER EXTREMITY N/A 01/09/2018   Procedure: ABDOMINAL AORTOGRAM W/LOWER EXTREMITY;  Surgeon: Cephus Shelling, MD;  Location: [facility] INVASIVE CV LAB;  Service: Cardiovascular;  Laterality: N/A;   ABDOMINAL AORTOGRAM W/LOWER EXTREMITY Left 09/22/2019   Procedure: ABDOMINAL AORTOGRAM W/LOWER EXTREMITY;  Surgeon: Maeola Harman, MD;  Location: [facility] INVASIVE CV LAB;  Service: Cardiovascular;  Laterality: Left;   ABDOMINAL AORTOGRAM  W/LOWER EXTREMITY Left 12/02/2020   Procedure: ABDOMINAL AORTOGRAM W/LOWER EXTREMITY;  Surgeon: Cephus Shelling, MD;  Location: [facility] IN[facility]AS[facility];  Service: Cardiovascular;  Laterality: Left;   ABDOMINAL AORTOGRAM W/LOWER EXTREMITY N/A 12/22/2020   Procedure: ABDOMINAL AORTOGRAM W/LOWER EXTREMITY;  Surgeon: Victorino Sparrow, MD;  Location: [facility] INVASIVE CV LAB;  Service: Cardiovascular;  Laterality: N/A;   AMPUTATION Left 02/11/2021   Procedure: LEFT ABOVE KNEE AMPUTATION;  Surgeon: Cephus Shelling, MD;  Location: [facility] OR;  Service: Vascular;  Laterality: Left;   BYPASS GRAFT POPLITEAL TO TIBIAL Left 12/31/2020   Procedure: LEFT JUMP GRAFT REVISION BELOW KNEE POPLITEAL TO PERONEAL;  Surgeon: Cephus Shelling, MD;  Location: [facility] OR;  Service: Vascular;  Laterality: Left;   EMBOLIZATION Left 12/03/2020   Procedure: EMBOLIZATION;  Surgeon: Leonie Douglas, MD;  [facility]: [facility] INVAS[facility] CV LAB;  Service: Cardiovascular;  Laterality: Left;   FEMORAL-POPLITEAL BYPASS GRAFT Left 12/14/2016   Procedure: BYPASS GRAFT COMMON FEMORAL- BELOW KNEE POPLITEAL ARTERY with Bovine Patch to Below the knee poplitieal artery.;  Surgeon: Fransisco Hertz, MD;  Location: [facility] OR;  Service: Vascular;  Laterality: Left;   FEMORAL-POPLITEAL BYPASS GRAFT Left 01/28/2018   Procedure: BYPASS GRAFT FEMORAL-BELOW KNEE POPLITEAL ARTERY REDO with propaten vascular graft removable ring;  Surgeon: Cephus Shelling, MD;  Location: [facility] OR;  Service: Vascular;  Laterality: Left;   FEMUR SURGERY Right 2009   pt. has a rod in that leg  LOWER EXTREMITY ANGIOGRAPHY Left 11/28/2016   Procedure: Lower Extremity Angiography;  Surgeon: Adrian Prows, MD;  Location: [place] CV LAB;  Service: Cardiovascular;  Laterality: Left;  lysis followup lower leg   PATCH ANGIOPLASTY Left 01/28/2018   Procedure: PATCH ANGIOPLASTY USING Rueben Bash BIOLOGIC PATCH;  Surgeon: Marty Heck, MD;  Location: [place];  Service: Vascular;  Laterality: Left;    PATCH ANGIOPLASTY Left 06/16/2020   Procedure: PATCH ANGIOPLASTY OF LEFT BELOW KNEE [place] ARTERY;  Surgeon: Marty Heck, MD;  Location: [place];  Service: Vascular;  Laterality: Left;   PERIPHERAL VASCULAR BALLOON ANGIOPLASTY  11/28/2016   Procedure: PERIPHERAL VASCULAR BALLOON ANGIOPLASTY;  Surgeon: Adrian Prows, MD;  Location: [place] CV LAB;  Service: Cardiovascular;;  SFA   PERIPHERAL VASCULAR BALLOON ANGIOPLASTY  12/03/2020   Procedure: PERIPHERAL VASCULAR BALLOON ANGIOPLASTY;  Surgeon: Cherre Robins, MD;  Location: [place] CV LAB;  Service: Cardiovascular;;  TP Trunk and Peroneal   PERIPHERAL VASCULAR CATHETERIZATION N/A 08/11/2014   Procedure: Lower Extremity Angiography;  Surgeon: Adrian Prows, MD;  Location: [place] CV LAB;  Service: Cardiovascular;  Laterality: N/A;   PERIPHERAL VASCULAR INTERVENTION Left 09/22/2019   Procedure: PERIPHERAL VASCULAR INTERVENTION;  Surgeon: Waynetta Sandy, MD;  Location: [place] CV LAB;  Service: Cardiovascular;  Laterality: Left;   PERIPHERAL VASCULAR INTERVENTION Left 09/23/2019   Procedure: PERIPHERAL VASCULAR INTERVENTION;  Surgeon: Serafina Mitchell, MD;  Location: [place] CV LAB;  Service: Cardiovascular;  Laterality: Left;   PERIPHERAL VASCULAR INTERVENTION Left 05/13/2020   Procedure: PERIPHERAL VASCULAR INTERVENTION;  Surgeon: Marty Heck, MD;  Location: [place] CV LAB;  Service: Cardiovascular;  Laterality: Left;   PERIPHERAL VASCULAR INTERVENTION Left 12/02/2020   Procedure: PERIPHERAL VASCULAR INTERVENTION;  Surgeon: Marty Heck, MD;  Location: [place] CV LAB;  Service: Cardiovascular;  Laterality: Left;   PERIPHERAL VASCULAR INTERVENTION  12/03/2020   Procedure: PERIPHERAL VASCULAR INTERVENTION;  Surgeon: Cherre Robins, MD;  Location: [place] CV LAB;  Service: Cardiovascular;;   PERIPHERAL VASCULAR INTERVENTION Left 12/22/2020   Procedure: PERIPHERAL VASCULAR INTERVENTION;   Surgeon: Broadus John, MD;  Location: [place] CV LAB;  Service: Cardiovascular;  Laterality: Left;   PERIPHERAL VASCULAR THROMBECTOMY  11/28/2016   Procedure: PERIPHERAL VASCULAR THROMBECTOMY;  Surgeon: Adrian Prows, MD;  Location: [place] CV LAB;  Service: Cardiovascular;;  [place]da   PERIPHERAL VASCULAR THROMBECTOMY  11/28/2016   Procedure: PERIPHERAL VASCULAR THROMBECTOMY;  Surgeon: Adrian Prows, MD;  Location: [place] CV LAB;  Service: Cardiovascular;;   PERIPHERAL VASCULAR THROMBECTOMY N/A 05/12/2020   Procedure: PERIPHERAL VASCULAR THROMBECTOMY-Left Leg;  Surgeon: Cherre Robins, MD;  Location: [place] CV LAB;  Service: Cardiovascular;  Laterality: N/A;   PERIPHERAL VASCULAR THROMBECTOMY N/A 05/13/2020   Procedure: PERIPHERAL VASCULAR THROMBECTOMY- LYSIS RECHECK;  Surgeon: Marty Heck, MD;  Location: [place] CV LAB;  Service: Cardiovascular;  Laterality: N/A;   PERIPHERAL VASCULAR THROMBECTOMY N/A 12/02/2020   Procedure: PERIPHERAL VASCULAR THROMBECTOMY;  Surgeon: Marty Heck, MD;  Location: [place] CV LAB;  Service: Cardiovascular;  Laterality: N/A;   PERIPHERAL VASCULAR THROMBECTOMY N/A 12/03/2020   Procedure: LYSIS RECHECK;  Surgeon: Cherre Robins, MD;  Location: [place] CV LAB;  Service: Cardiovascular;  Laterality: N/A;   PULMONARY THROMBECTOMY N/A 09/23/2019   Procedure: LYSIS RECHECK;  Surgeon: Serafina Mitchell, MD;  Location: [place] CV LAB;  Service: Cardiovascular;  Laterality: N/A;   THROMBECTOMY FEMORAL ARTERY Left 12/14/2016   Procedure: THROMBECTOMY  PROFUNDA FEMORAL ARTERY;  Surgeon: Conrad [place], MD;  Location: [place];  Service: Vascular;  Laterality: Left;   THROMBECTOMY FEMORAL ARTERY Left 06/16/2020   Procedure: Redo exposure ot Left below knee popiteal artery;  Surgeon: Marty Heck, MD;  Location: [place];  Service: Vascular;  Laterality: Left;   THROMBECTOMY OF BYPASS GRAFT FEMORAL- POPLITEAL ARTERY Left 06/16/2020    Procedure: THROMBECTOMY OF BYPASS GRAFT FEMORAL-POPLITEAL ARTERY and TIBIAL ARTERY;  Surgeon: Marty Heck, MD;  Location: [place];  Service: Vascular;  Laterality: Left;   THROMBECTOMY OF BYPASS GRAFT FEMORAL- POPLITEAL ARTERY Left 12/31/2020   Procedure: THROMBECTOMY OF LEFT COMMON FEMORAL TO BELOW KNEE POPLITEAL ARTERY BYPASS GRAFT;  Surgeon: Marty Heck, MD;  Location: [place];  Service: Vascular;  Laterality: Left;   VEIN HARVEST Left 12/31/2020   Procedure: Left Greater Saphenous Vein Harvest;  Surgeon: Marty Heck, MD;  Location: [place];  Service: Vascular;  Laterality: Left;   Patient Active Problem List   Diagnosis Date Noted   Peripheral artery disease ([place]) 12/31/2020   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   Thrombosis of femoro-popliteal bypass graft ([place]) 05/12/2020   Critical lower limb ischemia ([place]) 12/11/2016   Claudication in peripheral vascular disease ([place]) 11/28/2016   HTN (hypertension) 10/09/2014   Tobacco use 10/09/2014   PAD (peripheral artery disease) ([place])     REFERRING DIAG: 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  PAIN:  Are you having pain?  Yes: NPRS scale: arrived to PT  5/10 when walking & aches 3/10 sitting .  At night is awaken with pain up to 3/10 Pain location: distal residual limb Pain description: cramping when he wakes up, soreness walking & aches when sitting with prosthesis. Aggravating factors: walking with sudden increases in time Relieving factors: wearing  prosthesis  OBJECTIVE:    POSTURE: 05/19/21:  rounded shoulders, forward head, flexed trunk , and weight shift right   LE ROM:   ROM P:passive  A:active Right 05/19/2021 Left 05/19/2021  Hip flexion      Hip extension   -15* standing  Hip abduction      Hip adduction      Hip internal rotation      Hip external rotation      Knee flexion      Knee extension      Ankle dorsiflexion      Ankle plantarflexion      Ankle inversion      Ankle eversion       (Blank rows = not tested) MMT:   MMT Right 05/19/2021 Left 05/19/2021  Hip flexion      Hip extension   3/5 Gross functional  Hip abduction   3/5 Gross functional  Hip adduction      Hip internal rotation      Hip external rotation      Knee flexion      Knee extension      Ankle dorsiflexion      Ankle plantarflexion      Ankle inversion  Ankle eversion      (Blank rows = not tested)   TRANSFERS: Sit to stand: 05/19/21:  SBA and requires UE assist with armrests & RW touch to stabilize Stand to sit: 05/19/21:  SBA and requires UE assist with armrests & RW stabilization   GAIT: 05/19/21:  Gait pattern: step to pattern, decreased step length- Right, decreased stance time- Left, decreased hip/knee flexion- Left, circumduction- Left, Left hip hike, antalgic, trunk flexed, abducted- Left, and poor foot clearance- Left  Distance walked: 79' Assistive device utilized: Environmental consultant - 2 wheeled and TFA prosthesis  excessive UE weight bearing Level of assistance: SBA   FUNCTIONAL TESTs:  Berg Balance Scale: 05/19/21:  17/56   [facility] PT Assessment - 05/19/21 0900                Standardized Balance Assessment    Standardized Balance Assessment Berg Balance Test          Berg Balance Test    Sit to Stand Needs minimal aid to stand or to stabilize     Standing Unsupported Able to stand 2 minutes with supervision     Sitting with Back Unsupported but Feet Supported on Floor or Stool Able to sit safely and securely 2 minutes      Stand to Sit Controls descent by using hands     Transfers Able to transfer safely, definite need of hands     Standing Unsupported with Eyes Closed Unable to keep eyes closed 3 seconds but stays steady     Standing Unsupported with Feet Together Needs help to attain position but able to stand for 30 seconds with feet together     From Standing, Reach Forward with Outstretched Arm Reaches forward but needs supervision     From Standing Position, Pick up Object from Floor Unable to try/needs assist to keep balance     From Standing Position, Turn to Look Behind Over each Shoulder Needs assist to keep from losing balance and falling     Turn 360 Degrees Needs assistance while turning     Standing Unsupported, Alternately Place Feet on Step/Stool Needs assistance to keep from falling or unable to try     Standing Unsupported, One Foot in [employer] balance while stepping or standing     Standing on One Leg Unable to try or needs assist to prevent fall     Total Score 17     Berg comment: BERG  < 36 high risk for falls (close to 100%) 46-51 moderate (>50%)   37-45 significant (>80%) 52-55 lower (> 25%)                   CURRENT PROSTHETIC WEAR ASSESSMENT: 05/19/21:  Patient is dependent with: skin check, residual limb care, care of non-amputated limb, prosthetic cleaning, ply sock cleaning, correct ply sock adjustment, proper wear schedule/adjustment, and proper weight-bearing schedule/adjustment Donning prosthesis: SBA Doffing prosthesis: Modified independence Prosthetic wear tolerance: 5-6 hours/day, 16 of 16 days since delivery.  Prosthetic weight bearing tolerance: 5 minutes with partial weight on prosthesis & intermittent support with RW Residual limb condition: 2 invaginated areas with one not clean, 1 small area on incision that may be suture working way out, dry skin, cylinderical shape, nonpitting edema,  Prosthetic description: silicon liner with velcro lanyard, ischial containment  socket with flexible inner liner, SAFETY knee (single axis nonhydraulic), K2 flexible keel foot     TODAY'S TREATMENT:  07/19/21 Prosthetic Training with Transfemoral Prosthesis: He appeared to  not be fully down in prosthethic socket despite not wearing any ply sock. We reviewed how to sink down further into prosthesis by standing and marching his sound side limb X 10, and stepping forward and backward with sound side limb X 10 using RW support, then to tighten his strap after that in standing which helps to improve suspension. This improved some but still with some complaints, we then worked on step ups with Rt leg and step downs with left leg with bilateral support in bars X 10 off of 6 inch step and then we tightened strap one more time. Then upon ambulation 75 feet across gym he did relay he was no longer having distal residual limb pain but does get squeezing sensations from time to time mid residual limb.   Therex: Fwd/retro walking in bars with bilat UE suppport 3 round trips Lateral walking in bars with bilat UE suppport 3 round trips Leg press DL 50# X 20, Rt leg only 37# X 20 Nu step 8 min for functional endurance and strength L8 X 5 min, then reduce to L7 for another 3 min  07/14/2021 Prosthetic Training with Transfemoral Prosthesis: For phantom pain, PT recommended to minimize time that his limb is "naked" (without prosthesis or shrinker).  When removing prosthesis have everything handy. So he can quickly wipe down his leg & donne shrinker.  Pt verbalized understanding.  PT demo & verbal cues on donning liner so correct orientation of suspension strap and proper donning socks to minimize wrinkles.  PT demo & verbal cues for proper rotation of socket with knee bent initially in sitting then slight toe-out in standing. PT reviewed adjusting ply socks (based on how deep limb seats into socket NOT padding).  PT educated on salt intake relation to limb volume and ply socks. Pt was too far out  of socket with 1-ply sock. PT removed sock and limb was able to seat deeper into socket but still not as completely as should. Pt reported on way out of clinic that limb felt better but was sore still. His limb had some soreness from improper socks & donning that may take a few days to subside.   PT reviewed need to tight suspension strap in standing frequently during the day.  If strap loosens with his limb seating deeper into socket with weight bearing activities, then his limb will piston and irritate his limb.  Pt verbalized understanding of above.  PT demo & verbal cues to not rotate right heel under midline as causes LEs to hit in swing &/or abduct prosthesis.    07/12/2021 Prosthetic Training with Transfemoral Prosthesis: PT instructed in sweating with prosthesis. PT recommended use of antiperspirant on limb after morning shower and to clean limb during showering & at end of day. PT reviewed proper cleaning of liner. Issues with sweat trapped in liner may be itching that he feels and also increases risk of skin issues if he does not address.   Increasing your activity level is important.  Short distances which is walking from one room to another. Work to increase frequency back to prior level.  Medium distances are entering & exiting your home or community with limited distances. Start with 4 medium walks which is one outing to one location and increase number of tolerated amounts per day.  Long distance is your highest tolerance for you. Walk until you feel you must rest. Back or leg pain or general fatigue are indicators to maximum tolerance. Monitor by distance or  time. Try to walk your BEST distance 1-2 times per day. You should see this increase over time.   Pt verbalized understanding.  PT reviewed with demo proper orientation for rotation of socket & fully tightening strap. PT educated in adjusting ply socks.  Pt verbalized understanding.  Limb pressure & pain may be related to  increasing weight bearing too quickly, incorrect socks, strap not tight &/or socket rotated. Pt verbalized understanding.   Therapeutic Exercise: Nustep seat 8 level 7 with BLEs & BUEs for 8 min  07/06/2021 Prosthetic Training with Transfemoral Prosthesis: Pt ambulated 80' with cane stand alone tip initially with HHA then progressed to no support LUE. He requires modA to [place]. Verbal & tactile cues on upright posture, initial contact timing with heel, wt shift over prosthesis in stance. Pt amb inside //bar with cane support 8' X 5 with min guard. Short cautious steps. Verbal cues for above.  Neuromuscular Re-ed: Initially at sink: worked on proper technique & sequencing to turn 90* right & left progressing from sink support to cane support (requires intermittent touch or minA).  Progressed to turning 90* inside //bars with cane & minA. Sit to/from stand from chairs without armrests to no device to stabilize & PT not stabalizing chair.   Therapeutic Exercise: Nustep seat 8 level 6 with BLEs & BUEs for 5 min     07/04/21 1250  PT Education  Education Details Access Code: [email or web address]  Person(s) Educated Patient  Methods Explanation;Demonstration;Tactile cues;Verbal cues;Handout  Comprehension Verbalized understanding;Returned demonstration;Verbal cues required;Tactile cues required;Need further instruction   Access Code: [email or web address] URL: [email or web address] Date: 07/04/2021 Prepared by: Jamey Reas  Exercises - Seated Hamstring Stretch with Strap  - 2 x daily - 7 x weekly - 1 sets - 2-3 reps - 30 seconds hold - Modified [place] Stretch  - 2 x daily - 7 x weekly - 1 sets - 2 reps - 30 seconds hold - Supine Dynamic Modified [place] Quad and Hip Flexor Dynamic Stretch  - 2 x daily - 7 x weekly - 1 sets - 2 reps - 30 seconds hold - Standing posture with back to counter  - 2 x daily - 7 x weekly - 1 sets - 1 reps - 5-10 minutes hold - Upright Stance at Door Frame Single Arm  - 3-6  x daily - 7 x weekly - 1 sets - 2 reps - 2 deep breathes hold - Upright Stance at Door Frame with Both Arms  - 3-6 x daily - 7 x weekly - 1 sets - 2 reps - 2 deep breathes hold - feet together eyes open on floor  - 1 x daily - 4-7 x weekly - 1 sets - 10 reps - 2 seconds hold - Feet Apart with Eyes Closed with Head Motions  - 1 x daily - 4-7 x weekly - 1 sets - 10 reps - 2 seconds hold - Wide stance on Foam Pad head movements  - 1 x daily - 4-7 x weekly - 1 sets - 10 reps - 2 seconds hold - Standing Quarter Turn with Counter Support  - 1 x daily - 4-7 x weekly - 1 sets - 10 reps - Side Stepping with Counter Support  - 1 x daily - 5-7 x weekly - 1 sets - 10 reps - Backward Walking with Counter Support  - 1 x daily - 5-7 x weekly - 1 sets - 10 reps    05/31/21 1426  PT Education  Education Details HEP  for posture & hip flexor stretch  Access Code: [email or web address]  Person(s) Educated Patient;Spouse  Methods Explanation;Demonstration;Tactile cues;Verbal cues;Handout  Comprehension Verbalized understanding;Returned demonstration;Tactile cues required;Verbal cues required;Need further instruction   Access Code: [email or web address] URL: [email or web address] Date: 05/31/2021 Prepared by: Jamey Reas  Exercises - Seated Hamstring Stretch with Strap  - 2 x daily - 7 x weekly - 1 sets - 2-3 reps - 30 seconds hold - Modified [place] Stretch  - 2 x daily - 7 x weekly - 1 sets - 2 reps - 30 seconds hold - Supine Dynamic Modified [place] Quad and Hip Flexor Dynamic Stretch  - 2 x daily - 7 x weekly - 1 sets - 2 reps - 30 seconds hold - Standing posture with back to counter  - 2 x daily - 7 x weekly - 1 sets - 1 reps - 5-10 minutes hold - Upright Stance at Door Frame Single Arm  - 3-6 x daily - 7 x weekly - 1 sets - 2 reps - 2 deep breathes hold - Upright Stance at Door Frame with Both Arms  - 3-6 x daily - 7 x weekly - 1 sets - 2 reps - 2 deep breathes hold   05/24/21 1151  PT Education  Education  Details HEP at sink & residual limb pain vs phantom sensation vs phantom pain  Person(s) Educated Patient  Methods Explanation;Demonstration;Tactile cues;Verbal cues;Handout  Comprehension Verbalized understanding;Returned demonstration;Verbal cues required;Tactile cues required;Need further instruction   ASSESSMENT:   CLINICAL IMPRESSION: He was again limited some with pain and soreness in residual limb that is limiting his ability to bear weight thru prosthesis. This did improve some within session after reviewing ways to sink down further into socket then re-tighten his strap. I also explained that first thing in the morning he will have more fluid in his residual limb making his socket too tight and as he walks and moves this will pump out some fluid allowing him to improve fit and comfort and that he will need to re-tightnen strap to maintain this as he gains it.    OBJECTIVE IMPAIRMENTS Abnormal gait, decreased activity tolerance, decreased balance, decreased endurance, decreased knowledge of condition, decreased knowledge of use of DME, decreased mobility, decreased ROM, decreased strength, impaired flexibility, postural dysfunction, prosthetic dependency , and pain.    ACTIVITY LIMITATIONS community activity and occupation.    PERSONAL FACTORS Fitness, Time since onset of injury/illness/exacerbation, and 3+ comorbidities: see PMH  are also affecting patient's functional outcome.    REHAB POTENTIAL: Good   CLINICAL DECISION MAKING: Evolving/moderate complexity   EVALUATION COMPLEXITY: Moderate     GOALS: Goals reviewed with patient? Yes   SHORT TERM GOALS: Target date: 07/14/2021   Patient verbalized proper adjustment of ply socks. Goal status: reviewed 07/14/21 - ongoing.  2.  Patient tolerates prosthesis >90% awake hrs /day without skin issues or limb pain </= 1/10 after standing / walking. Goal status: 6/15/223 met for time & no skin issues but ongoing for limb pain.    3.   Patient able to pick up items from floor without UE support with supervision. Goal status: ongoing 07/12/2021   4. Patient ambulates 100' with cane stand alone tip & prosthesis with min guard. Goal status: oongoing 07/12/2021  LONG TERM GOALS: Target date: 08/17/2021   Patient demonstrates & verbalized understanding of prosthetic care to enable safe utilization of prosthesis. Baseline: SEE OBJECTIVE DATA Goal status: INITIAL   Patient tolerates prosthesis wear >90% of awake hours without skin or  limb pain issues. Baseline: SEE OBJECTIVE DATA Goal status: INITIAL   Berg Balance >/= 30/56 to indicate lower fall risk Baseline: SEE OBJECTIVE DATA Goal status: INITIAL   Patient ambulates >500' with LRAD & prosthesis modified independent. Baseline: SEE OBJECTIVE DATA Goal status: INITIAL   Patient negotiates ramps, curbs & stairs with single rail with LRAD & prosthesis modified independent. Baseline: SEE OBJECTIVE DATA Goal status: INITIAL     PLAN: PT FREQUENCY: 2x/week   PT DURATION: 12 weeks   PLANNED INTERVENTIONS: Therapeutic exercises, Therapeutic activity, Neuromuscular re-education, Balance training, Gait training, Patient/Family education, Stair training, Vestibular training, Prosthetic training, and DME instructions   PLAN FOR NEXT SESSION: how is pain and fit in socket? work towards LTGs including balance & gait with cane.      Debbe Odea, PT, DPT 07/19/2021, 9:17 AM

## 2021-07-21 ENCOUNTER — Ambulatory Visit (INDEPENDENT_AMBULATORY_CARE_PROVIDER_SITE_OTHER): Payer: Medicare Other | Admitting: Physical Therapy

## 2021-07-21 ENCOUNTER — Encounter: Payer: Self-pay | Admitting: Physical Therapy

## 2021-07-21 DIAGNOSIS — R2689 Other abnormalities of gait and mobility: Secondary | ICD-10-CM

## 2021-07-21 DIAGNOSIS — R293 Abnormal posture: Secondary | ICD-10-CM | POA: Diagnosis not present

## 2021-07-21 DIAGNOSIS — M6281 Muscle weakness (generalized): Secondary | ICD-10-CM

## 2021-07-21 DIAGNOSIS — R2681 Unsteadiness on feet: Secondary | ICD-10-CM | POA: Diagnosis not present

## 2021-07-21 NOTE — Therapy (Signed)
OUTPATIENT PHYSICAL THERAPY PROSTHETIC TREATMENT NOTE   Patient Name: Ryan [name] MRN: [id number] DOB:13-Sep-1950, 71 y.o., male Today's Date: 07/21/2021  PCP: Iona Beard, MD REFERRING PROVIDER: Karoline Caldwell, PA-C       PT End of Session - 07/21/21 0935     Visit Number 19    Number of Visits 25    Date for PT Re-Evaluation 08/17/21    Authorization Type Medicare A/B & Generic commercial    Progress Note Due on Visit 20    PT Start Time 0931    PT Stop Time 1012    PT Time Calculation (min) 41 min    Equipment Utilized During Treatment Gait belt    Activity Tolerance Patient tolerated treatment well    Behavior During Therapy WFL for tasks assessed/performed                           Past Medical History:  Diagnosis Date   COVID 2022   Hypertension    PVD (peripheral vascular disease) ([place])    Past Surgical History:  Procedure Laterality Date   ABDOMINAL AORTAGRAM Left 12/13/2016   ABDOMINAL AORTOGRAM W/LOWER EXTREMITY/notes 12/13/2016   ABDOMINAL AORTOGRAM W/LOWER EXTREMITY N/A 11/28/2016   Procedure: ABDOMINAL AORTOGRAM W/LOWER EXTREMITY;  Surgeon: Adrian Prows, MD;  Location: [place] [facility];  Service: Cardiovascular;  Laterality: N/A;  bilateral   ABDOMINAL AORTOGRAM W/LOWER EXTREMITY N/A 12/13/2016   Procedure: ABDOMINAL AORTOGRAM W/LOWER EXTREMITY;  Surgeon: Waynetta Sandy, MD;  Location: [place] [facility];  Service: Cardiovascular;  Laterality: N/A;  unilater left leg   ABDOMINAL AORTOGRAM W/LOWER EXTREMITY N/A 01/09/2018   Procedure: ABDOMINAL AORTOGRAM W/LOWER EXTREMITY;  Surgeon: Marty Heck, MD;  Location: [place] [facility];  Service: Cardiovascular;  Laterality: N/A;   ABDOMINAL AORTOGRAM W/LOWER EXTREMITY Left 09/22/2019   Procedure: ABDOMINAL AORTOGRAM W/LOWER EXTREMITY;  Surgeon: Waynetta Sandy, MD;  Location: [place] [facility];  Service: Cardiovascular;  Laterality: Left;   ABDOMINAL AORTOGRAM  W/LOWER EXTREMITY Left 12/02/2020   Procedure: ABDOMINAL AORTOGRAM W/LOWER EXTREMITY;  Surgeon: Marty Heck, MD;  Location: [place] [facility] [facility];  Service: Cardiovascular;  Laterality: Left;   ABDOMINAL AORTOGRAM W/LOWER EXTREMITY N/A 12/22/2020   Procedure: ABDOMINAL AORTOGRAM W/LOWER EXTREMITY;  Surgeon: Broadus John, MD;  Location: [place] [facility];  Service: Cardiovascular;  Laterality: N/A;   AMPUTATION Left 02/11/2021   Procedure: LEFT ABOVE KNEE AMPUTATION;  Surgeon: Marty Heck, MD;  Location: [place];  Service: Vascular;  Laterality: Left;   BYPASS GRAFT POPLITEAL TO TIBIAL Left 12/31/2020   Procedure: LEFT JUMP GRAFT REVISION BELOW KNEE POPLITEAL TO PERONEAL;  Surgeon: Marty Heck, MD;  Location: [place];  Service: Vascular;  Laterality: Left;   EMBOLIZATION Left 12/03/2020   Procedure: EMBOLIZATION;  Surgeon: Cherre Robins, MD;  Location: [place] [facility];  Service: Cardiovascular;  Laterality: Left;   FEMORAL-POPLITEAL BYPASS GRAFT Left 12/14/2016   Procedure: BYPASS GRAFT COMMON FEMORAL- BELOW KNEE POPLITEAL ARTERY with Bovine Patch to Below the knee poplitieal artery.;  Surgeon: Conrad Hedley, MD;  Location: [facility];  Service: Vascular;  Laterality: Left;   FEMORAL-POPLITEAL BYPASS GRAFT Left 01/28/2018   Procedure: BYPASS GRAFT FEMORAL-BELOW KNEE POPLITEAL ARTERY REDO with propaten vascular graft removable ring;  Surgeon: Marty Heck, MD;  Location: [place];  Service: Vascular;  Laterality: Left;   FEMUR SURGERY Right 2009   pt. has a rod in that leg  LOWER EXTREMITY ANGIOGRAPHY Left 11/28/2016   Procedure: Lower Extremity Angiography;  Surgeon: Adrian Prows, MD;  Location: [place] [facility];  Service: Cardiovascular;  Laterality: Left;  lysis followup lower leg   PATCH ANGIOPLASTY Left 01/28/2018   Procedure: PATCH ANGIOPLASTY USING Rueben Bash BIOLOGIC PATCH;  Surgeon: Marty Heck, MD;  Location: [place];  Service: Vascular;  Laterality: Left;    PATCH ANGIOPLASTY Left 06/16/2020   Procedure: PATCH ANGIOPLASTY OF LEFT BELOW KNEE [place] ARTERY;  Surgeon: Marty Heck, MD;  Location: [place];  Service: Vascular;  Laterality: Left;   PERIPHERAL VASCULAR BALLOON ANGIOPLASTY  11/28/2016   Procedure: PERIPHERAL VASCULAR BALLOON ANGIOPLASTY;  Surgeon: Adrian Prows, MD;  Location: [place] [facility];  Service: Cardiovascular;;  SFA   PERIPHERAL VASCULAR BALLOON ANGIOPLASTY  12/03/2020   Procedure: PERIPHERAL VASCULAR BALLOON ANGIOPLASTY;  Surgeon: Cherre Robins, MD;  Location: [place] [facility];  Service: Cardiovascular;;  TP Trunk and Peroneal   PERIPHERAL VASCULAR CATHETERIZATION N/A 08/11/2014   Procedure: Lower Extremity Angiography;  Surgeon: Adrian Prows, MD;  Location: [place] [facility];  Service: Cardiovascular;  Laterality: N/A;   PERIPHERAL VASCULAR INTERVENTION Left 09/22/2019   Procedure: PERIPHERAL VASCULAR INTERVENTION;  Surgeon: Waynetta Sandy, MD;  Location: [place] [facility];  Service: Cardiovascular;  Laterality: Left;   PERIPHERAL VASCULAR INTERVENTION Left 09/23/2019   Procedure: PERIPHERAL VASCULAR INTERVENTION;  Surgeon: Serafina Mitchell, MD;  Location: [place] [facility];  Service: Cardiovascular;  Laterality: Left;   PERIPHERAL VASCULAR INTERVENTION Left 05/13/2020   Procedure: PERIPHERAL VASCULAR INTERVENTION;  Surgeon: Marty Heck, MD;  Location: [place] [facility];  Service: Cardiovascular;  Laterality: Left;   PERIPHERAL VASCULAR INTERVENTION Left 12/02/2020   Procedure: PERIPHERAL VASCULAR INTERVENTION;  Surgeon: Marty Heck, MD;  Location: [place] [facility];  Service: Cardiovascular;  Laterality: Left;   PERIPHERAL VASCULAR INTERVENTION  12/03/2020   Procedure: PERIPHERAL VASCULAR INTERVENTION;  Surgeon: Cherre Robins, MD;  Location: [place] [facility];  Service: Cardiovascular;;   PERIPHERAL VASCULAR INTERVENTION Left 12/22/2020   Procedure: PERIPHERAL VASCULAR INTERVENTION;   Surgeon: Broadus John, MD;  Location: [place] [facility];  Service: Cardiovascular;  Laterality: Left;   PERIPHERAL VASCULAR THROMBECTOMY  11/28/2016   Procedure: PERIPHERAL VASCULAR THROMBECTOMY;  Surgeon: Adrian Prows, MD;  Location: [place] [facility];  Service: Cardiovascular;;  Profunda   PERIPHERAL VASCULAR THROMBECTOMY  11/28/2016   Procedure: PERIPHERAL VASCULAR THROMBECTOMY;  Surgeon: Adrian Prows, MD;  Location: [place] [facility];  Service: Cardiovascular;;   PERIPHERAL VASCULAR THROMBECTOMY N/A 05/12/2020   Procedure: PERIPHERAL VASCULAR THROMBECTOMY-Left Leg;  Surgeon: Cherre Robins, MD;  Location: [place] [facility];  Service: Cardiovascular;  Laterality: N/A;   PERIPHERAL VASCULAR THROMBECTOMY N/A 05/13/2020   Procedure: PERIPHERAL VASCULAR THROMBECTOMY- LYSIS RECHECK;  Surgeon: Marty Heck, MD;  Location: [place] [facility];  Service: Cardiovascular;  Laterality: N/A;   PERIPHERAL VASCULAR THROMBECTOMY N/A 12/02/2020   Procedure: PERIPHERAL VASCULAR THROMBECTOMY;  Surgeon: Marty Heck, MD;  Location: [place] [facility];  Service: Cardiovascular;  Laterality: N/A;   PERIPHERAL VASCULAR THROMBECTOMY N/A 12/03/2020   Procedure: LYSIS RECHECK;  Surgeon: Cherre Robins, MD;  Location: [place] [facility];  Service: Cardiovascular;  Laterality: N/A;   PULMONARY THROMBECTOMY N/A 09/23/2019   Procedure: LYSIS RECHECK;  Surgeon: Serafina Mitchell, MD;  Location: [place] [facility];  Service: Cardiovascular;  Laterality: N/A;   THROMBECTOMY FEMORAL ARTERY Left 12/14/2016   Procedure: THROMBECTOMY  PROFUNDA FEMORAL ARTERY;  Surgeon: Conrad Brooktrails, MD;  Location: [place];  Service: Vascular;  Laterality: Left;   THROMBECTOMY FEMORAL ARTERY Left 06/16/2020   Procedure: Redo exposure ot Left below knee popiteal artery;  Surgeon: Marty Heck, MD;  Location: [facility];  Service: Vascular;  Laterality: Left;   THROMBECTOMY OF BYPASS GRAFT FEMORAL- POPLITEAL ARTERY Left 06/16/2020    Procedure: THROMBECTOMY OF BYPASS GRAFT FEMORAL-POPLITEAL ARTERY and TIBIAL ARTERY;  Surgeon: Marty Heck, MD;  Location: [place];  Service: Vascular;  Laterality: Left;   THROMBECTOMY OF BYPASS GRAFT FEMORAL- POPLITEAL ARTERY Left 12/31/2020   Procedure: THROMBECTOMY OF LEFT COMMON FEMORAL TO BELOW KNEE POPLITEAL ARTERY BYPASS GRAFT;  Surgeon: Marty Heck, MD;  Location: [place];  Service: Vascular;  Laterality: Left;   VEIN HARVEST Left 12/31/2020   Procedure: Left Greater Saphenous Vein Harvest;  Surgeon: Marty Heck, MD;  Location: [place];  Service: Vascular;  Laterality: Left;   Patient Active Problem List   Diagnosis Date Noted   Peripheral artery disease ([place]) 12/31/2020   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[place]) 05/12/2020   Thrombosis of femoro-popliteal bypass graft ([place]) 05/12/2020   Critical lower limb ischemia ([place]) 12/11/2016   Claudication in peripheral vascular disease ([place]) 11/28/2016   HTN (hypertension) 10/09/2014   Tobacco use 10/09/2014   PAD (peripheral artery disease) ([place])     REFERRING DIAG: 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  He has not figured out any differences.  He wears prothesis all awake hours and walks with walker.  The itching has stopped with washing limb & liner when removes prosthesis ad also in mornings.  He also using rubbing alcohol on limb.     PAIN:  Are you having pain?  Yes: NPRS scale: arrived to PT  0/10 no longer wakes up  in morning due to pain. Some throbbing up to 2/10.  Pain location: distal residual limb Pain description: cramping when he wakes up, soreness  walking & aches when sitting with prosthesis. Aggravating factors: walking with sudden increases in time Relieving factors: wearing prosthesis  OBJECTIVE:    POSTURE: 05/19/21:  rounded shoulders, forward head, flexed trunk , and weight shift right   LE ROM:   ROM P:passive  A:active Right 05/19/2021 Left 05/19/2021  Hip flexion      Hip extension   -15* standing  Hip abduction      Hip adduction      Hip internal rotation      Hip external rotation      Knee flexion      Knee extension      Ankle dorsiflexion      Ankle plantarflexion      Ankle inversion      Ankle eversion       (Blank rows = not tested) MMT:   MMT Right 05/19/2021 Left 05/19/2021  Hip flexion      Hip extension   3/5 Gross functional  Hip abduction   3/5 Gross functional  Hip adduction      Hip internal rotation      Hip external rotation      Knee flexion  Knee extension      Ankle dorsiflexion      Ankle plantarflexion      Ankle inversion      Ankle eversion      (Blank rows = not tested)   TRANSFERS: Sit to stand: 05/19/21:  SBA and requires UE assist with armrests & RW touch to stabilize Stand to sit: 05/19/21:  SBA and requires UE assist with armrests & RW stabilization   GAIT: 05/19/21:  Gait pattern: step to pattern, decreased step length- Right, decreased stance time- Left, decreased hip/knee flexion- Left, circumduction- Left, Left hip hike, antalgic, trunk flexed, abducted- Left, and poor foot clearance- Left  Distance walked: 8' Assistive device utilized: Environmental consultant - 2 wheeled and TFA prosthesis  excessive UE weight bearing Level of assistance: SBA   FUNCTIONAL TESTs:  Berg Balance Scale: 05/19/21:  17/56   [facility] PT Assessment - 05/19/21 0900                Standardized Balance Assessment    Standardized Balance Assessment Berg Balance Test          Berg Balance Test    Sit to Stand Needs minimal aid to stand or to stabilize     Standing Unsupported Able to stand 2 minutes  with supervision     Sitting with Back Unsupported but Feet Supported on Floor or Stool Able to sit safely and securely 2 minutes     Stand to Sit Controls descent by using hands     Transfers Able to transfer safely, definite need of hands     Standing Unsupported with Eyes Closed Unable to keep eyes closed 3 seconds but stays steady     Standing Unsupported with Feet Together Needs help to attain position but able to stand for 30 seconds with feet together     From Standing, Reach Forward with Outstretched Arm Reaches forward but needs supervision     From Standing Position, Pick up Object from Floor Unable to try/needs assist to keep balance     From Standing Position, Turn to Look Behind Over each Shoulder Needs assist to keep from losing balance and falling     Turn 360 Degrees Needs assistance while turning     Standing Unsupported, Alternately Place Feet on Step/Stool Needs assistance to keep from falling or unable to try     Standing Unsupported, One Foot in [employer] balance while stepping or standing     Standing on One Leg Unable to try or needs assist to prevent fall     Total Score 17     Berg comment: BERG  < 36 high risk for falls (close to 100%) 46-51 moderate (>50%)   37-45 significant (>80%) 52-55 lower (> 25%)                   CURRENT PROSTHETIC WEAR ASSESSMENT: 05/19/21:  Patient is dependent with: skin check, residual limb care, care of non-amputated limb, prosthetic cleaning, ply sock cleaning, correct ply sock adjustment, proper wear schedule/adjustment, and proper weight-bearing schedule/adjustment Donning prosthesis: SBA Doffing prosthesis: Modified independence Prosthetic wear tolerance: 5-6 hours/day, 16 of 16 days since delivery.  Prosthetic weight bearing tolerance: 5 minutes with partial weight on prosthesis & intermittent support with RW Residual limb condition: 2 invaginated areas with one not clean, 1 small area on incision that may be suture working way  out, dry skin, cylinderical shape, nonpitting edema,  Prosthetic description: silicon liner with velcro lanyard, ischial containment socket with  flexible inner liner, SAFETY knee (single axis nonhydraulic), K2 flexible keel foot     TODAY'S TREATMENT:  07/21/2021 Prosthetic Training with Transfemoral Prosthesis: PT reminded pt to tighten strap during day to limit pistoning & decreased control of prosthesis. Pt ambulated with RW with cues for upright posture. Pt amb 50' with cane with minA. He was able to correct prosthetic foot placement as needed without need for hand hold assist.    Therex: Leg press DL 56# X 20, Rt leg only 43# X 20 Nu step seat 7 with BLEs & BUEs level 7 for 8 min.   Neuromuscular Re-ed: Standing in corner with feet hip width apart head movements right/left, up/down & diagonals: 1) eyes open 10 reps ea then same direction eyes closed 10 reps with intermittent touch to stabiize  2)eyes open on foam 10 reps ea head motion with increased touch for stabilization.  Moving prosthesis forward to bilateral stance, lateral to bilateral stance & back to bilateral stance to facilitate stabilization in multiple directions & ability to correct placement.  Pt needed intermittent touch on //bars. With RUE support only on //bar: ambulated forward & backwards 3 laps and sidestepping 3 laps.  Pt able to balance & stabilize without touching LUE or PT assist.    07/19/21 Prosthetic Training with Transfemoral Prosthesis: He appeared to not be fully down in prosthethic socket despite not wearing any ply sock. We reviewed how to sink down further into prosthesis by standing and marching his sound side limb X 10, and stepping forward and backward with sound side limb X 10 using RW support, then to tighten his strap after that in standing which helps to improve suspension. This improved some but still with some complaints, we then worked on step ups with Rt leg and step downs with left leg with  bilateral support in bars X 10 off of 6 inch step and then we tightened strap one more time. Then upon ambulation 75 feet across gym he did relay he was no longer having distal residual limb pain but does get squeezing sensations from time to time mid residual limb.   Therex: Fwd/retro walking in bars with bilat UE suppport 3 round trips Lateral walking in bars with bilat UE suppport 3 round trips Leg press DL 50# X 20, Rt leg only 37# X 20 Nu step 8 min for functional endurance and strength L8 X 5 min, then reduce to L7 for another 3 min  07/14/2021 Prosthetic Training with Transfemoral Prosthesis: For phantom pain, PT recommended to minimize time that his limb is "naked" (without prosthesis or shrinker).  When removing prosthesis have everything handy. So he can quickly wipe down his leg & donne shrinker.  Pt verbalized understanding.  PT demo & verbal cues on donning liner so correct orientation of suspension strap and proper donning socks to minimize wrinkles.  PT demo & verbal cues for proper rotation of socket with knee bent initially in sitting then slight toe-out in standing. PT reviewed adjusting ply socks (based on how deep limb seats into socket NOT padding).  PT educated on salt intake relation to limb volume and ply socks. Pt was too far out of socket with 1-ply sock. PT removed sock and limb was able to seat deeper into socket but still not as completely as should. Pt reported on way out of clinic that limb felt better but was sore still. His limb had some soreness from improper socks & donning that may take a few days to  subside.   PT reviewed need to tight suspension strap in standing frequently during the day.  If strap loosens with his limb seating deeper into socket with weight bearing activities, then his limb will piston and irritate his limb.  Pt verbalized understanding of above.  PT demo & verbal cues to not rotate right heel under midline as causes LEs to hit in swing &/or  abduct prosthesis.        07/04/21 1250  PT Education  Education Details Access Code: [email or web address]  Person(s) Educated Patient  Methods Explanation;Demonstration;Tactile cues;Verbal cues;Handout  Comprehension Verbalized understanding;Returned demonstration;Verbal cues required;Tactile cues required;Need further instruction   Access Code: [email or web address] URL: [email or web address] Date: 07/04/2021 Prepared by: Jamey Reas  Exercises - Seated Hamstring Stretch with Strap  - 2 x daily - 7 x weekly - 1 sets - 2-3 reps - 30 seconds hold - Modified Marker Stretch  - 2 x daily - 7 x weekly - 1 sets - 2 reps - 30 seconds hold - Supine Dynamic Modified Labus Quad and Hip Flexor Dynamic Stretch  - 2 x daily - 7 x weekly - 1 sets - 2 reps - 30 seconds hold - Standing posture with back to counter  - 2 x daily - 7 x weekly - 1 sets - 1 reps - 5-10 minutes hold - Upright Stance at Door Frame Single Arm  - 3-6 x daily - 7 x weekly - 1 sets - 2 reps - 2 deep breathes hold - Upright Stance at Door Frame with Both Arms  - 3-6 x daily - 7 x weekly - 1 sets - 2 reps - 2 deep breathes hold - feet together eyes open on floor  - 1 x daily - 4-7 x weekly - 1 sets - 10 reps - 2 seconds hold - Feet Apart with Eyes Closed with Head Motions  - 1 x daily - 4-7 x weekly - 1 sets - 10 reps - 2 seconds hold - Wide stance on Foam Pad head movements  - 1 x daily - 4-7 x weekly - 1 sets - 10 reps - 2 seconds hold - Standing Quarter Turn with Counter Support  - 1 x daily - 4-7 x weekly - 1 sets - 10 reps - Side Stepping with Counter Support  - 1 x daily - 5-7 x weekly - 1 sets - 10 reps - Backward Walking with Counter Support  - 1 x daily - 5-7 x weekly - 1 sets - 10 reps    05/31/21 1426  PT Education  Education Details HEP for posture & hip flexor stretch  Access Code: [email or web address]  Person(s) Educated Patient;Spouse  Methods Explanation;Demonstration;Tactile cues;Verbal cues;Handout  Comprehension Verbalized  understanding;Returned demonstration;Tactile cues required;Verbal cues required;Need further instruction   Access Code: [email or web address] URL: [email or web address] Date: 05/31/2021 Prepared by: Jamey Reas  Exercises - Seated Hamstring Stretch with Strap  - 2 x daily - 7 x weekly - 1 sets - 2-3 reps - 30 seconds hold - Modified Ginty Stretch  - 2 x daily - 7 x weekly - 1 sets - 2 reps - 30 seconds hold - Supine Dynamic Modified Reihl Quad and Hip Flexor Dynamic Stretch  - 2 x daily - 7 x weekly - 1 sets - 2 reps - 30 seconds hold - Standing posture with back to counter  - 2 x daily - 7 x weekly - 1 sets - 1 reps - 5-10 minutes hold - Upright Stance at Door Frame Single Arm  -  3-6 x daily - 7 x weekly - 1 sets - 2 reps - 2 deep breathes hold - Upright Stance at Door Frame with Both Arms  - 3-6 x daily - 7 x weekly - 1 sets - 2 reps - 2 deep breathes hold   05/24/21 1151  PT Education  Education Details HEP at sink & residual limb pain vs phantom sensation vs phantom pain  Person(s) Educated Patient  Methods Explanation;Demonstration;Tactile cues;Verbal cues;Handout  Comprehension Verbalized understanding;Returned demonstration;Verbal cues required;Tactile cues required;Need further instruction   ASSESSMENT:   CLINICAL IMPRESSION: Pt's residual limb pain appears to be improving enabling more activities.  His balance appears to be improving with less need for UE support.    OBJECTIVE IMPAIRMENTS Abnormal gait, decreased activity tolerance, decreased balance, decreased endurance, decreased knowledge of condition, decreased knowledge of use of DME, decreased mobility, decreased ROM, decreased strength, impaired flexibility, postural dysfunction, prosthetic dependency , and pain.    ACTIVITY LIMITATIONS community activity and occupation.    PERSONAL FACTORS Fitness, Time since onset of injury/illness/exacerbation, and 3+ comorbidities: see PMH  are also affecting patient's  functional outcome.    REHAB POTENTIAL: Good   CLINICAL DECISION MAKING: Evolving/moderate complexity   EVALUATION COMPLEXITY: Moderate     GOALS: Goals reviewed with patient? Yes   SHORT TERM GOALS: Target date: 07/14/2021   Patient verbalized proper adjustment of ply socks. Goal status: MET 07/21/2021 2.  Patient tolerates prosthesis >90% awake hrs /day without skin issues or limb pain </= 1/10 after standing / walking. Goal status: 6/15/223 met for time & no skin issues but ongoing for limb pain.    3.  Patient able to pick up items from floor without UE support with supervision. Goal status: ongoing 07/21/2021   4. Patient ambulates 100' with cane stand alone tip & prosthesis with min guard. Goal status: oongoing 07/21/2021  LONG TERM GOALS: Target date: 08/17/2021   Patient demonstrates & verbalized understanding of prosthetic care to enable safe utilization of prosthesis. Baseline: SEE OBJECTIVE DATA Goal status: INITIAL   Patient tolerates prosthesis wear >90% of awake hours without skin or limb pain issues. Baseline: SEE OBJECTIVE DATA Goal status: INITIAL   Berg Balance >/= 30/56 to indicate lower fall risk Baseline: SEE OBJECTIVE DATA Goal status: INITIAL   Patient ambulates >500' with LRAD & prosthesis modified independent. Baseline: SEE OBJECTIVE DATA Goal status: INITIAL   Patient negotiates ramps, curbs & stairs with single rail with LRAD & prosthesis modified independent. Baseline: SEE OBJECTIVE DATA Goal status: INITIAL     PLAN: PT FREQUENCY: 2x/week   PT DURATION: 12 weeks   PLANNED INTERVENTIONS: Therapeutic exercises, Therapeutic activity, Neuromuscular re-education, Balance training, Gait training, Patient/Family education, Stair training, Vestibular training, Prosthetic training, and DME instructions   PLAN FOR NEXT SESSION: work towards LTGs including balance & gait with cane for household & RW for community.      Jamey Reas, PT,  DPT 07/21/2021, 11:03 AM

## 2021-07-26 ENCOUNTER — Ambulatory Visit (INDEPENDENT_AMBULATORY_CARE_PROVIDER_SITE_OTHER): Payer: Medicare Other | Admitting: Physical Therapy

## 2021-07-26 ENCOUNTER — Encounter: Payer: Self-pay | Admitting: Physical Therapy

## 2021-07-26 DIAGNOSIS — R2681 Unsteadiness on feet: Secondary | ICD-10-CM | POA: Diagnosis not present

## 2021-07-26 DIAGNOSIS — R293 Abnormal posture: Secondary | ICD-10-CM

## 2021-07-26 DIAGNOSIS — R2689 Other abnormalities of gait and mobility: Secondary | ICD-10-CM | POA: Diagnosis not present

## 2021-07-26 DIAGNOSIS — M6281 Muscle weakness (generalized): Secondary | ICD-10-CM

## 2021-07-28 ENCOUNTER — Ambulatory Visit (INDEPENDENT_AMBULATORY_CARE_PROVIDER_SITE_OTHER): Payer: Medicare Other | Admitting: Physical Therapy

## 2021-07-28 ENCOUNTER — Encounter: Payer: Self-pay | Admitting: Physical Therapy

## 2021-07-28 DIAGNOSIS — M6281 Muscle weakness (generalized): Secondary | ICD-10-CM | POA: Diagnosis not present

## 2021-07-28 DIAGNOSIS — R2689 Other abnormalities of gait and mobility: Secondary | ICD-10-CM

## 2021-07-28 DIAGNOSIS — R293 Abnormal posture: Secondary | ICD-10-CM

## 2021-07-28 DIAGNOSIS — R2681 Unsteadiness on feet: Secondary | ICD-10-CM | POA: Diagnosis not present

## 2021-07-28 NOTE — Therapy (Signed)
OUTPATIENT PHYSICAL THERAPY PROSTHETIC TREATMENT NOTE   Patient Name: Ryan [name] MRN: [id number] DOB:1950-06-20, 71 y.o., male Today's Date: 07/28/2021  PCP: Iona Beard, MD REFERRING PROVIDER: Karoline Caldwell, PA-C      PT End of Session - 07/28/21 0932     Visit Number 21    Number of Visits 25    Date for PT Re-Evaluation 08/17/21    Authorization Type Medicare A/B & Generic commercial    Progress Note Due on Visit 30    PT Start Time 0930    PT Stop Time 1012    PT Time Calculation (min) 42 min    Equipment Utilized During Treatment Gait belt    Activity Tolerance Patient tolerated treatment well    Behavior During Therapy WFL for tasks assessed/performed                             Past Medical History:  Diagnosis Date   COVID 2022   Hypertension    PVD (peripheral vascular disease) ([place])    Past Surgical History:  Procedure Laterality Date   ABDOMINAL AORTAGRAM Left 12/13/2016   ABDOMINAL AORTOGRAM W/LOWER EXTREMITY/notes 12/13/2016   ABDOMINAL AORTOGRAM W/LOWER EXTREMITY N/A 11/28/2016   Procedure: ABDOMINAL AORTOGRAM W/LOWER EXTREMITY;  Surgeon: Adrian Prows, MD;  Location: [place] [facility];  Service: Cardiovascular;  Laterality: N/A;  bilateral   ABDOMINAL AORTOGRAM W/LOWER EXTREMITY N/A 12/13/2016   Procedure: ABDOMINAL AORTOGRAM W/LOWER EXTREMITY;  Surgeon: Waynetta Sandy, MD;  Location: [place] [facility];  Service: Cardiovascular;  Laterality: N/A;  unilater left leg   ABDOMINAL AORTOGRAM W/LOWER EXTREMITY N/A 01/09/2018   Procedure: ABDOMINAL AORTOGRAM W/LOWER EXTREMITY;  Surgeon: Marty Heck, MD;  Location: [place] [facility];  Service: Cardiovascular;  Laterality: N/A;   ABDOMINAL AORTOGRAM W/LOWER EXTREMITY Left 09/22/2019   Procedure: ABDOMINAL AORTOGRAM W/LOWER EXTREMITY;  Surgeon: Waynetta Sandy, MD;  Location: [place] [facility];  Service: Cardiovascular;  Laterality: Left;   ABDOMINAL AORTOGRAM  W/LOWER EXTREMITY Left 12/02/2020   Procedure: ABDOMINAL AORTOGRAM W/LOWER EXTREMITY;  Surgeon: Marty Heck, MD;  Location: [place] [facility];  Service: Cardiovascular;  Laterality: Left;   ABDOMINAL AORTOGRAM W/LOWER EXTREMITY N/A 12/22/2020   Procedure: ABDOMINAL AORTOGRAM W/LOWER EXTREMITY;  Surgeon: Broadus John, MD;  Location: [place] [facility];  Service: Cardiovascular;  Laterality: N/A;   AMPUTATION Left 02/11/2021   Procedure: LEFT ABOVE KNEE AMPUTATION;  Surgeon: Marty Heck, MD;  Location: [place];  Service: Vascular;  Laterality: Left;   BYPASS GRAFT POPLITEAL TO TIBIAL Left 12/31/2020   Procedure: LEFT JUMP GRAFT REVISION BELOW KNEE POPLITEAL TO PERONEAL;  Surgeon: Marty Heck, MD;  Location: [place];  Service: Vascular;  Laterality: Left;   EMBOLIZATION Left 12/03/2020   Procedure: EMBOLIZATION;  Surgeon: Cherre Robins, MD;  Location: [place] [facility];  Service: Cardiovascular;  Laterality: Left;   FEMORAL-POPLITEAL BYPASS GRAFT Left 12/14/2016   Procedure: BYPASS GRAFT COMMON FEMORAL- BELOW KNEE POPLITEAL ARTERY with Bovine Patch to Below the knee poplitieal artery.;  Surgeon: Conrad Berlin, MD;  Location[facility];  Service: Vascular;  Laterality: Left;   FEMORAL-POPLITEAL BYPASS GRAFT Left 01/28/2018   Procedure: BYPASS GRAFT FEMORAL-BELOW KNEE POPLITEAL ARTERY REDO with propaten vascular graft removable ring;  Surgeon: Marty Heck, MD;  Location: [place];  Service: Vascular;  Laterality: Left;   FEMUR SURGERY Right 2009   pt. has a rod in that leg  LOWER EXTREMITY ANGIOGRAPHY Left 11/28/2016   Procedure: Lower Extremity Angiography;  Surgeon: Adrian Prows, MD;  Location: [place] [facility];  Service: Cardiovascular;  Laterality: Left;  lysis followup lower leg   PATCH ANGIOPLASTY Left 01/28/2018   Procedure: PATCH ANGIOPLASTY USING Rueben Bash BIOLOGIC PATCH;  Surgeon: Marty Heck, MD;  Location: [place];  Service: Vascular;  Laterality: Left;    PATCH ANGIOPLASTY Left 06/16/2020   Procedure: PATCH ANGIOPLASTY OF LEFT BELOW KNEE [place] ARTERY;  Surgeon: Marty Heck, MD;  Location: [place];  Service: Vascular;  Laterality: Left;   PERIPHERAL VASCULAR BALLOON ANGIOPLASTY  11/28/2016   Procedure: PERIPHERAL VASCULAR BALLOON ANGIOPLASTY;  Surgeon: Adrian Prows, MD;  Location: [place] [facility];  Service: Cardiovascular;;  SFA   PERIPHERAL VASCULAR BALLOON ANGIOPLASTY  12/03/2020   Procedure: PERIPHERAL VASCULAR BALLOON ANGIOPLASTY;  Surgeon: Cherre Robins, MD;  Location: [place] [facility];  Service: Cardiovascular;;  TP Trunk and Peroneal   PERIPHERAL VASCULAR CATHETERIZATION N/A 08/11/2014   Procedure: Lower Extremity Angiography;  Surgeon: Adrian Prows, MD;  Location: [place] [facility];  Service: Cardiovascular;  Laterality: N/A;   PERIPHERAL VASCULAR INTERVENTION Left 09/22/2019   Procedure: PERIPHERAL VASCULAR INTERVENTION;  Surgeon: Waynetta Sandy, MD;  Location: [place] [facility];  Service: Cardiovascular;  Laterality: Left;   PERIPHERAL VASCULAR INTERVENTION Left 09/23/2019   Procedure: PERIPHERAL VASCULAR INTERVENTION;  Surgeon: Serafina Mitchell, MD;  Location: [place] [facility];  Service: Cardiovascular;  Laterality: Left;   PERIPHERAL VASCULAR INTERVENTION Left 05/13/2020   Procedure: PERIPHERAL VASCULAR INTERVENTION;  Surgeon: Marty Heck, MD;  Location: [place] [facility];  Service: Cardiovascular;  Laterality: Left;   PERIPHERAL VASCULAR INTERVENTION Left 12/02/2020   Procedure: PERIPHERAL VASCULAR INTERVENTION;  Surgeon: Marty Heck, MD;  Location: [place] [facility];  Service: Cardiovascular;  Laterality: Left;   PERIPHERAL VASCULAR INTERVENTION  12/03/2020   Procedure: PERIPHERAL VASCULAR INTERVENTION;  Surgeon: Cherre Robins, MD;  Location: [place] [facility];  Service: Cardiovascular;;   PERIPHERAL VASCULAR INTERVENTION Left 12/22/2020   Procedure: PERIPHERAL VASCULAR INTERVENTION;   Surgeon: Broadus John, MD;  Location: [place] [facility];  Service: Cardiovascular;  Laterality: Left;   PERIPHERAL VASCULAR THROMBECTOMY  11/28/2016   Procedure: PERIPHERAL VASCULAR THROMBECTOMY;  Surgeon: Adrian Prows, MD;  Location: [place] [facility];  Service: Cardiovascular;;  Profunda   PERIPHERAL VASCULAR THROMBECTOMY  11/28/2016   Procedure: PERIPHERAL VASCULAR THROMBECTOMY;  Surgeon: Adrian Prows, MD;  Location: [place] [facility];  Service: Cardiovascular;;   PERIPHERAL VASCULAR THROMBECTOMY N/A 05/12/2020   Procedure: PERIPHERAL VASCULAR THROMBECTOMY-Left Leg;  Surgeon: Cherre Robins, MD;  Location: [place] [facility];  Service: Cardiovascular;  Laterality: N/A;   PERIPHERAL VASCULAR THROMBECTOMY N/A 05/13/2020   Procedure: PERIPHERAL VASCULAR THROMBECTOMY- LYSIS RECHECK;  Surgeon: Marty Heck, MD;  Location: [place] [facility];  Service: Cardiovascular;  Laterality: N/A;   PERIPHERAL VASCULAR THROMBECTOMY N/A 12/02/2020   Procedure: PERIPHERAL VASCULAR THROMBECTOMY;  Surgeon: Marty Heck, MD;  Location: [place] [facility];  Service: Cardiovascular;  Laterality: N/A;   PERIPHERAL VASCULAR THROMBECTOMY N/A 12/03/2020   Procedure: LYSIS RECHECK;  Surgeon: Cherre Robins, MD;  Location: [place] [facility];  Service: Cardiovascular;  Laterality: N/A;   PULMONARY THROMBECTOMY N/A 09/23/2019   Procedure: LYSIS RECHECK;  Surgeon: Serafina Mitchell, MD;  Location: [place] [facility];  Service: Cardiovascular;  Laterality: N/A;   THROMBECTOMY FEMORAL ARTERY Left 12/14/2016   Procedure: THROMBECTOMY  PROFUNDA FEMORAL ARTERY;  Surgeon: Conrad [place], MD;  Location: [place];  Service: Vascular;  Laterality: Left;   THROMBECTOMY FEMORAL ARTERY Left 06/16/2020   Procedure: Redo exposure ot Left below knee popiteal artery;  Surgeon: Marty Heck, MD;  [facility];  Service: Vascular;  Laterality: Left;   THROMBECTOMY OF BYPASS GRAFT FEMORAL- POPLITEAL ARTERY Left 06/16/2020    Procedure: THROMBECTOMY OF BYPASS GRAFT FEMORAL-POPLITEAL ARTERY and TIBIAL ARTERY;  Surgeon: Marty Heck, MD;  Location: [place];  Service: Vascular;  Laterality: Left;   THROMBECTOMY OF BYPASS GRAFT FEMORAL- POPLITEAL ARTERY Left 12/31/2020   Procedure: THROMBECTOMY OF LEFT COMMON FEMORAL TO BELOW KNEE POPLITEAL ARTERY BYPASS GRAFT;  Surgeon: Marty Heck, MD;  Location: [place];  Service: Vascular;  Laterality: Left;   VEIN HARVEST Left 12/31/2020   Procedure: Left Greater Saphenous Vein Harvest;  Surgeon: Marty Heck, MD;  Location: [place];  Service: Vascular;  Laterality: Left;   Patient Active Problem List   Diagnosis Date Noted   Peripheral artery disease ([place]) 12/31/2020   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[place]) 05/12/2020   Thrombosis of femoro-popliteal bypass graft ([place]) 05/12/2020   Critical lower limb ischemia ([place]) 12/11/2016   Claudication in peripheral vascular disease ([place]) 11/28/2016   HTN (hypertension) 10/09/2014   Tobacco use 10/09/2014   PAD (peripheral artery disease) ([place])     REFERRING DIAG: 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  His limb was better after PT 2 days ago and no issues still this morning. He wears shrinker as soon as removes prosthesis and overnight.    PAIN:  Are you having pain?  Yes: NPRS scale: arrived to PT  0/10. He reports waking up  in morning due to pain. Pain 0/10 since last PT Pain location: distal residual limb Pain description: cramping when he wakes up, soreness walking & aches when sitting with prosthesis. Aggravating factors: walking  with sudden increases in time Relieving factors: wearing prosthesis  OBJECTIVE:    POSTURE: 05/19/21:  rounded shoulders, forward head, flexed trunk , and weight shift right   LE ROM:   ROM P:passive  A:active Right 05/19/2021 Left 05/19/2021  Hip flexion      Hip extension   -15* standing  Hip abduction      Hip adduction      Hip internal rotation      Hip external rotation      Knee flexion      Knee extension      Ankle dorsiflexion      Ankle plantarflexion      Ankle inversion      Ankle eversion       (Blank rows = not tested) MMT:   MMT Right 05/19/2021 Left 05/19/2021  Hip flexion      Hip extension   3/5 Gross functional  Hip abduction   3/5 Gross functional  Hip adduction      Hip internal rotation      Hip external rotation      Knee flexion      Knee extension      Ankle dorsiflexion  Ankle plantarflexion      Ankle inversion      Ankle eversion      (Blank rows = not tested)   TRANSFERS: Sit to stand: 05/19/21:  SBA and requires UE assist with armrests & RW touch to stabilize Stand to sit: 05/19/21:  SBA and requires UE assist with armrests & RW stabilization   GAIT: 05/19/21:  Gait pattern: step to pattern, decreased step length- Right, decreased stance time- Left, decreased hip/knee flexion- Left, circumduction- Left, Left hip hike, antalgic, trunk flexed, abducted- Left, and poor foot clearance- Left  Distance walked: 30' Assistive device utilized: Environmental consultant - 2 wheeled and TFA prosthesis  excessive UE weight bearing Level of assistance: SBA   FUNCTIONAL TESTs:  Berg Balance Scale: 05/19/21:  17/56   [facility] PT Assessment - 05/19/21 0900                Standardized Balance Assessment    Standardized Balance Assessment Berg Balance Test          Berg Balance Test    Sit to Stand Needs minimal aid to stand or to stabilize     Standing Unsupported Able to stand 2 minutes with supervision     Sitting with Back Unsupported but Feet Supported on  Floor or Stool Able to sit safely and securely 2 minutes     Stand to Sit Controls descent by using hands     Transfers Able to transfer safely, definite need of hands     Standing Unsupported with Eyes Closed Unable to keep eyes closed 3 seconds but stays steady     Standing Unsupported with Feet Together Needs help to attain position but able to stand for 30 seconds with feet together     From Standing, Reach Forward with Outstretched Arm Reaches forward but needs supervision     From Standing Position, Pick up Object from Floor Unable to try/needs assist to keep balance     From Standing Position, Turn to Look Behind Over each Shoulder Needs assist to keep from losing balance and falling     Turn 360 Degrees Needs assistance while turning     Standing Unsupported, Alternately Place Feet on Step/Stool Needs assistance to keep from falling or unable to try     Standing Unsupported, One Foot in [employer] balance while stepping or standing     Standing on One Leg Unable to try or needs assist to prevent fall     Total Score 17     Berg comment: BERG  < 36 high risk for falls (close to 100%) 46-51 moderate (>50%)   37-45 significant (>80%) 52-55 lower (> 25%)                   CURRENT PROSTHETIC WEAR ASSESSMENT: 05/19/21:  Patient is dependent with: skin check, residual limb care, care of non-amputated limb, prosthetic cleaning, ply sock cleaning, correct ply sock adjustment, proper wear schedule/adjustment, and proper weight-bearing schedule/adjustment Donning prosthesis: SBA Doffing prosthesis: Modified independence Prosthetic wear tolerance: 5-6 hours/day, 16 of 16 days since delivery.  Prosthetic weight bearing tolerance: 5 minutes with partial weight on prosthesis & intermittent support with RW Residual limb condition: 2 invaginated areas with one not clean, 1 small area on incision that may be suture working way out, dry skin, cylinderical shape, nonpitting edema,  Prosthetic  description: silicon liner with velcro lanyard, ischial containment socket with flexible inner liner, SAFETY knee (single axis nonhydraulic), K2 flexible keel foot  TODAY'S TREATMENT:  07/28/2021 Prosthetic Training with Transfemoral Prosthesis: Pt ambulated 100' X 2 with HHA & cane with cues on upright posture & wt shift over prosthesis in stance. Progressed to no HHA for 20' with min guard.  Pt ambulated 15' X 2 with 180* turn inside //bars without touching with cane with supervision.   Therex: Trunk ext: eccentric control reaching with control to chair bottom in front and concentric arising 10 reps with PT supervision.  Neuromuscular Re-ed:  stepping prosthesis forward with weight bearing for balance for 5 sec then step prosthesis back beside RLE without UE support, then stance on prosthesis stepping RLE forward with balance for 5 sec then step RLE back beside prosthesis with cane support. 5 reps. PT demo & verbal cues prior and verbal, mirror & tactile / contact assist during activity.  stepping prosthesis abduction with weight bearing for balance for 5 sec then step prosthesis back beside RLE without UE support, then stance on prosthesis stepping RLE abduction with balance for 5 sec then step RLE back beside prosthesis with cane support. 5 reps. PT demo & verbal cues prior and verbal, mirror & tactile / contact assist during activity.   07/26/2021 Prosthetic Training with Transfemoral Prosthesis: Pt ambulated 100' with HHA & cane with cues on upright posture & wt shift over prosthesis in stance. Progressed to no HHA for 20' with min guard.  Pt ambulated 15' with 180* turn inside //bars without touching with cane with supervision.  Pt able to abd & add prosthesis with cane support 5 reps.  PT demo & verbal cues on technique to pick up items on floor.  Pt performed 3 reps (first with min guard & 2nd/3rd with supervision).  PT demo set up for home to practice safely. Pt verbalized  understanding.  Pt ambulated 100' with cane carrying open bottle of water with supervision.   Therex: Nu step seat 7 with BLEs & BUEs level 7 for 8 min.  Trunk ext: eccentric control reaching with control to chair bottom in front and concentric arising 5 reps with PT supervision.  07/21/2021 Prosthetic Training with Transfemoral Prosthesis: PT reminded pt to tighten strap during day to limit pistoning & decreased control of prosthesis. Pt ambulated with RW with cues for upright posture. Pt amb 50' with cane with minA. He was able to correct prosthetic foot placement as needed without need for hand hold assist.    Therex: Leg press DL 56# X 20, Rt leg only 43# X 20 Nu step seat 7 with BLEs & BUEs level 7 for 8 min.   Neuromuscular Re-ed: Standing in corner with feet hip width apart head movements right/left, up/down & diagonals: 1) eyes open 10 reps ea then same direction eyes closed 10 reps with intermittent touch to stabiize  2)eyes open on foam 10 reps ea head motion with increased touch for stabilization.  Moving prosthesis forward to bilateral stance, lateral to bilateral stance & back to bilateral stance to facilitate stabilization in multiple directions & ability to correct placement.  Pt needed intermittent touch on //bars. With RUE support only on //bar: ambulated forward & backwards 3 laps and sidestepping 3 laps.  Pt able to balance & stabilize without touching LUE or PT assist.       07/04/21 1250  PT Education  Education Details Access Code: [email or web address]  Person(s) Educated Patient  Methods Explanation;Demonstration;Tactile cues;Verbal cues;Handout  Comprehension Verbalized understanding;Returned demonstration;Verbal cues required;Tactile cues required;Need further instruction   Access Code: [email or web address] URL: [email or web address] Date:  07/04/2021 Prepared by: Jamey Reas  Exercises - Seated Hamstring Stretch with Strap  - 2 x daily - 7 x weekly - 1 sets - 2-3  reps - 30 seconds hold - Modified [place]den Stretch  - 2 x daily - 7 x weekly - 1 sets - 2 reps - 30 seconds hold - Supine Dynamic Modified [place]a Quad and Hip Flexor Dynamic Stretch  - 2 x daily - 7 x weekly - 1 sets - 2 reps - 30 seconds hold - Standing posture with back to counter  - 2 x daily - 7 x weekly - 1 sets - 1 reps - 5-10 minutes hold - Upright Stance at Door Frame Single Arm  - 3-6 x daily - 7 x weekly - 1 sets - 2 reps - 2 deep breathes hold - Upright Stance at Door Frame with Both Arms  - 3-6 x daily - 7 x weekly - 1 sets - 2 reps - 2 deep breathes hold - feet together eyes open on floor  - 1 x daily - 4-7 x weekly - 1 sets - 10 reps - 2 seconds hold - Feet Apart with Eyes Closed with Head Motions  - 1 x daily - 4-7 x weekly - 1 sets - 10 reps - 2 seconds hold - Wide stance on Foam Pad head movements  - 1 x daily - 4-7 x weekly - 1 sets - 10 reps - 2 seconds hold - Standing Quarter Turn with Counter Support  - 1 x daily - 4-7 x weekly - 1 sets - 10 reps - Side Stepping with Counter Support  - 1 x daily - 5-7 x weekly - 1 sets - 10 reps - Backward Walking with Counter Support  - 1 x daily - 5-7 x weekly - 1 sets - 10 reps    05/31/21 1426  PT Education  Education Details HEP for posture & hip flexor stretch  Access Code: [email or web address]  Person(s) Educated Patient;Spouse  Methods Explanation;Demonstration;Tactile cues;Verbal cues;Handout  Comprehension Verbalized understanding;Returned demonstration;Tactile cues required;Verbal cues required;Need further instruction   Access Code: [email or web address] URL: [email or web address] Date: 05/31/2021 Prepared by: Jamey Reas  Exercises - Seated Hamstring Stretch with Strap  - 2 x daily - 7 x weekly - 1 sets - 2-3 reps - 30 seconds hold - Modified [place] Stretch  - 2 x daily - 7 x weekly - 1 sets - 2 reps - 30 seconds hold - Supine Dynamic Modified [place] Quad and Hip Flexor Dynamic Stretch  - 2 x daily - 7 x weekly - 1 sets - 2 reps -  30 seconds hold - Standing posture with back to counter  - 2 x daily - 7 x weekly - 1 sets - 1 reps - 5-10 minutes hold - Upright Stance at Door Frame Single Arm  - 3-6 x daily - 7 x weekly - 1 sets - 2 reps - 2 deep breathes hold - Upright Stance at Door Frame with Both Arms  - 3-6 x daily - 7 x weekly - 1 sets - 2 reps - 2 deep breathes hold   05/24/21 1151  PT Education  Education Details HEP at sink & residual limb pain vs phantom sensation vs phantom pain  Person(s) Educated Patient  Methods Explanation;Demonstration;Tactile cues;Verbal cues;Handout  Comprehension Verbalized understanding;Returned demonstration;Verbal cues required;Tactile cues required;Need further instruction   ASSESSMENT:   CLINICAL IMPRESSION: Megan is progressing with balance with ability to move prosthesis then weight to stabilize. His limb pain  appears to be improving with less frequent episodes.  He continues to benefit from skilled PT.    OBJECTIVE IMPAIRMENTS Abnormal gait, decreased activity tolerance, decreased balance, decreased endurance, decreased knowledge of condition, decreased knowledge of use of DME, decreased mobility, decreased ROM, decreased strength, impaired flexibility, postural dysfunction, prosthetic dependency , and pain.    ACTIVITY LIMITATIONS community activity and occupation.    PERSONAL FACTORS Fitness, Time since onset of injury/illness/exacerbation, and 3+ comorbidities: see PMH  are also affecting patient's functional outcome.    REHAB POTENTIAL: Good   CLINICAL DECISION MAKING: Evolving/moderate complexity   EVALUATION COMPLEXITY: Moderate     GOALS: Goals reviewed with patient? Yes   SHORT TERM GOALS: Target date: 07/14/2021   Patient verbalized proper adjustment of ply socks. Goal status: MET 07/21/2021 2.  Patient tolerates prosthesis >90% awake hrs /day without skin issues or limb pain </= 1/10 after standing / walking. Goal status: 6/15/223 met for time & no skin  issues but ongoing for limb pain.    3.  Patient able to pick up items from floor without UE support with supervision. Goal status: ongoing 07/21/2021   4. Patient ambulates 100' with cane stand alone tip & prosthesis with min guard. Goal status: oongoing 07/21/2021  LONG TERM GOALS: Target date: 08/17/2021   Patient demonstrates & verbalized understanding of prosthetic care to enable safe utilization of prosthesis. Baseline: SEE OBJECTIVE DATA Goal status: ongoing 07/26/21   Patient tolerates prosthesis wear >90% of awake hours without skin or limb pain issues. Baseline: SEE OBJECTIVE DATA Goal status: ongoing 07/26/21  Berg Balance >/= 30/56 to indicate lower fall risk Baseline: SEE OBJECTIVE DATA Goal status: ongoing 07/26/21   Patient ambulates >500' with LRAD & prosthesis modified independent. Baseline: SEE OBJECTIVE DATA Goal status: ongoing 07/26/21   Patient negotiates ramps, curbs & stairs with single rail with LRAD & prosthesis modified independent. Baseline: SEE OBJECTIVE DATA Goal status: Iongoing 07/26/21   PLAN: PT FREQUENCY: 2x/week   PT DURATION: 12 weeks   PLANNED INTERVENTIONS: Therapeutic exercises, Therapeutic activity, Neuromuscular re-education, Balance training, Gait training, Patient/Family education, Stair training, Vestibular training, Prosthetic training, and DME instructions   PLAN FOR NEXT SESSION:  work towards LTGs including balance & gait with cane for household & RW for community.    Jamey Reas, PT, DPT 07/28/2021, 12:41 PM

## 2021-08-01 ENCOUNTER — Encounter: Payer: Self-pay | Admitting: Physical Therapy

## 2021-08-01 ENCOUNTER — Ambulatory Visit (INDEPENDENT_AMBULATORY_CARE_PROVIDER_SITE_OTHER): Payer: Medicare Other | Admitting: Physical Therapy

## 2021-08-01 DIAGNOSIS — R293 Abnormal posture: Secondary | ICD-10-CM | POA: Diagnosis not present

## 2021-08-01 DIAGNOSIS — R2689 Other abnormalities of gait and mobility: Secondary | ICD-10-CM

## 2021-08-01 DIAGNOSIS — R2681 Unsteadiness on feet: Secondary | ICD-10-CM

## 2021-08-01 DIAGNOSIS — M6281 Muscle weakness (generalized): Secondary | ICD-10-CM

## 2021-08-01 NOTE — Therapy (Signed)
OUTPATIENT PHYSICAL THERAPY PROSTHETIC TREATMENT NOTE   Patient Name: Ryan [name] MRN: [id number] DOB:20-Jun-1950, 71 y.o., male Today's Date: 08/01/2021  PCP: Iona Beard, MD REFERRING PROVIDER: Karoline Caldwell, PA-C      PT End of Session - 08/01/21 0934     Visit Number 22    Number of Visits 25    Date for PT Re-Evaluation 08/17/21    Authorization Type Medicare A/B & Generic commercial    Progress Note Due on Visit 30    PT Start Time 0930    PT Stop Time 1015    PT Time Calculation (min) 45 min    Equipment Utilized During Treatment Gait belt    Activity Tolerance Patient tolerated treatment well    Behavior During Therapy WFL for tasks assessed/performed                              Past Medical History:  Diagnosis Date   COVID 2022   Hypertension    PVD (peripheral vascular disease) ([place])    Past Surgical History:  Procedure Laterality Date   ABDOMINAL AORTAGRAM Left 12/13/2016   ABDOMINAL AORTOGRAM W/LOWER EXTREMITY/notes 12/13/2016   ABDOMINAL AORTOGRAM W/LOWER EXTREMITY N/A 11/28/2016   Procedure: ABDOMINAL AORTOGRAM W/LOWER EXTREMITY;  Surgeon: Adrian Prows, MD;  Location: [place] [facility];  Service: Cardiovascular;  Laterality: N/A;  bilateral   ABDOMINAL AORTOGRAM W/LOWER EXTREMITY N/A 12/13/2016   Procedure: ABDOMINAL AORTOGRAM W/LOWER EXTREMITY;  Surgeon: Waynetta Sandy, MD;  Location: [place] [facility];  Service: Cardiovascular;  Laterality: N/A;  unilater left leg   ABDOMINAL AORTOGRAM W/LOWER EXTREMITY N/A 01/09/2018   Procedure: ABDOMINAL AORTOGRAM W/LOWER EXTREMITY;  Surgeon: Marty Heck, MD;  Location: [place] [facility];  Service: Cardiovascular;  Laterality: N/A;   ABDOMINAL AORTOGRAM W/LOWER EXTREMITY Left 09/22/2019   Procedure: ABDOMINAL AORTOGRAM W/LOWER EXTREMITY;  Surgeon: Waynetta Sandy, MD;  Location: [place] [facility];  Service: Cardiovascular;  Laterality: Left;   ABDOMINAL AORTOGRAM  W/LOWER EXTREMITY Left 12/02/2020   Procedure: ABDOMINAL AORTOGRAM W/LOWER EXTREMITY;  Surgeon: Marty Heck, MD;  Location: [place] [facility];  Service: Cardiovascular;  Laterality: Left;   ABDOMINAL AORTOGRAM W/LOWER EXTREMITY N/A 12/22/2020   Procedure: ABDOMINAL AORTOGRAM W/LOWER EXTREMITY;  Surgeon: Broadus John, MD;  Location: [place] [facility];  Service: Cardiovascular;  Laterality: N/A;   AMPUTATION Left 02/11/2021   Procedure: LEFT ABOVE KNEE AMPUTATION;  Surgeon: Marty Heck, MD;  Location: [place];  Service: Vascular;  Laterality: Left;   BYPASS GRAFT POPLITEAL TO TIBIAL Left 12/31/2020   Procedure: LEFT JUMP GRAFT REVISION BELOW KNEE POPLITEAL TO PERONEAL;  Surgeon: Marty Heck, MD;  Location: [place];  Service: Vascular;  Laterality: Left;   EMBOLIZATION Left 12/03/2020   Procedure: EMBOLIZATION;  Surgeon: Cherre Robins, MD;  Location: [place] [facility];  Service: Cardiovascular;  Laterality: Left;   FEMORAL-POPLITEAL BYPASS GRAFT Left 12/14/2016   Procedure: BYPASS GRAFT COMMON FEMORAL- BELOW KNEE POPLITEAL ARTERY with Bovine Patch to Below the knee poplitieal artery.;  Surgeon: Conrad Navassa, MD;  Location: [facility];  Service: Vascular;  Laterality: Left;   FEMORAL-POPLITEAL BYPASS GRAFT Left 01/28/2018   Procedure: BYPASS GRAFT FEMORAL-BELOW KNEE POPLITEAL ARTERY REDO with propaten vascular graft removable ring;  Surgeon: Marty Heck, MD;  Location: [place];  Service: Vascular;  Laterality: Left;   FEMUR SURGERY Right 2009   pt. has a rod in that  leg   LOWER EXTREMITY ANGIOGRAPHY Left 11/28/2016   Procedure: Lower Extremity Angiography;  Surgeon: Adrian Prows, MD;  Location: [place] [facility];  Service: Cardiovascular;  Laterality: Left;  lysis followup lower leg   PATCH ANGIOPLASTY Left 01/28/2018   Procedure: PATCH ANGIOPLASTY USING Rueben Bash BIOLOGIC PATCH;  Surgeon: Marty Heck, MD;  Location: [place];  Service: Vascular;  Laterality: Left;    PATCH ANGIOPLASTY Left 06/16/2020   Procedure: PATCH ANGIOPLASTY OF LEFT BELOW KNEE [place] ARTERY;  Surgeon: Marty Heck, MD;  Location: [place];  Service: Vascular;  Laterality: Left;   PERIPHERAL VASCULAR BALLOON ANGIOPLASTY  11/28/2016   Procedure: PERIPHERAL VASCULAR BALLOON ANGIOPLASTY;  Surgeon: Adrian Prows, MD;  Location: [place] [facility];  Service: Cardiovascular;;  SFA   PERIPHERAL VASCULAR BALLOON ANGIOPLASTY  12/03/2020   Procedure: PERIPHERAL VASCULAR BALLOON ANGIOPLASTY;  Surgeon: Cherre Robins, MD;  Location: [place] [facility];  Service: Cardiovascular;;  TP Trunk and Peroneal   PERIPHERAL VASCULAR CATHETERIZATION N/A 08/11/2014   Procedure: Lower Extremity Angiography;  Surgeon: Adrian Prows, MD;  Location: [place] [facility];  Service: Cardiovascular;  Laterality: N/A;   PERIPHERAL VASCULAR INTERVENTION Left 09/22/2019   Procedure: PERIPHERAL VASCULAR INTERVENTION;  Surgeon: Waynetta Sandy, MD;  Location: [place] [facility];  Service: Cardiovascular;  Laterality: Left;   PERIPHERAL VASCULAR INTERVENTION Left 09/23/2019   Procedure: PERIPHERAL VASCULAR INTERVENTION;  Surgeon: Serafina Mitchell, MD;  Location: [place] [facility];  Service: Cardiovascular;  Laterality: Left;   PERIPHERAL VASCULAR INTERVENTION Left 05/13/2020   Procedure: PERIPHERAL VASCULAR INTERVENTION;  Surgeon: Marty Heck, MD;  Location: [place] [facility];  Service: Cardiovascular;  Laterality: Left;   PERIPHERAL VASCULAR INTERVENTION Left 12/02/2020   Procedure: PERIPHERAL VASCULAR INTERVENTION;  Surgeon: Marty Heck, MD;  Location: [place] [facility];  Service: Cardiovascular;  Laterality: Left;   PERIPHERAL VASCULAR INTERVENTION  12/03/2020   Procedure: PERIPHERAL VASCULAR INTERVENTION;  Surgeon: Cherre Robins, MD;  Location: [place] [facility];  Service: Cardiovascular;;   PERIPHERAL VASCULAR INTERVENTION Left 12/22/2020   Procedure: PERIPHERAL VASCULAR INTERVENTION;   Surgeon: Broadus John, MD;  Location: [place] [facility];  Service: Cardiovascular;  Laterality: Left;   PERIPHERAL VASCULAR THROMBECTOMY  11/28/2016   Procedure: PERIPHERAL VASCULAR THROMBECTOMY;  Surgeon: Adrian Prows, MD;  Location: [place] [facility];  Service: Cardiovascular;;  Profunda   PERIPHERAL VASCULAR THROMBECTOMY  11/28/2016   Procedure: PERIPHERAL VASCULAR THROMBECTOMY;  Surgeon: Adrian Prows, MD;  Location: [place] [facility];  Service: Cardiovascular;;   PERIPHERAL VASCULAR THROMBECTOMY N/A 05/12/2020   Procedure: PERIPHERAL VASCULAR THROMBECTOMY-Left Leg;  Surgeon: Cherre Robins, MD;  Location: [place] [facility];  Service: Cardiovascular;  Laterality: N/A;   PERIPHERAL VASCULAR THROMBECTOMY N/A 05/13/2020   Procedure: PERIPHERAL VASCULAR THROMBECTOMY- LYSIS RECHECK;  Surgeon: Marty Heck, MD;  Location: [place] [facility];  Service: Cardiovascular;  Laterality: N/A;   PERIPHERAL VASCULAR THROMBECTOMY N/A 12/02/2020   Procedure: PERIPHERAL VASCULAR THROMBECTOMY;  Surgeon: Marty Heck, MD;  Location: [place] [facility];  Service: Cardiovascular;  Laterality: N/A;   PERIPHERAL VASCULAR THROMBECTOMY N/A 12/03/2020   Procedure: LYSIS RECHECK;  Surgeon: Cherre Robins, MD;  Location: [place] [facility];  Service: Cardiovascular;  Laterality: N/A;   PULMONARY THROMBECTOMY N/A 09/23/2019   Procedure: LYSIS RECHECK;  Surgeon: Serafina Mitchell, MD;  Location: [place] [facility];  Service: Cardiovascular;  Laterality: N/A;   THROMBECTOMY FEMORAL ARTERY Left 12/14/2016  Procedure: THROMBECTOMY PROFUNDA FEMORAL ARTERY;  Surgeon: Conrad Buhler, MD;  Location: [place];  Service: Vascular;  Laterality: Left;   THROMBECTOMY FEMORAL ARTERY Left 06/16/2020   Procedure: Redo exposure ot Left below knee popiteal artery;  Surgeon: Marty Heck, MD;  [facility];  Service: Vascular;  Laterality: Left;   THROMBECTOMY OF BYPASS GRAFT FEMORAL- POPLITEAL ARTERY Left 06/16/2020    Procedure: THROMBECTOMY OF BYPASS GRAFT FEMORAL-POPLITEAL ARTERY and TIBIAL ARTERY;  Surgeon: Marty Heck, MD;  Location: [place];  Service: Vascular;  Laterality: Left;   THROMBECTOMY OF BYPASS GRAFT FEMORAL- POPLITEAL ARTERY Left 12/31/2020   Procedure: THROMBECTOMY OF LEFT COMMON FEMORAL TO BELOW KNEE POPLITEAL ARTERY BYPASS GRAFT;  Surgeon: Marty Heck, MD;  Location: [place];  Service: Vascular;  Laterality: Left;   VEIN HARVEST Left 12/31/2020   Procedure: Left Greater Saphenous Vein Harvest;  Surgeon: Marty Heck, MD;  Location: [place];  Service: Vascular;  Laterality: Left;   Patient Active Problem List   Diagnosis Date Noted   Peripheral artery disease ([place]) 12/31/2020   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[place]) 05/12/2020   Thrombosis of femoro-popliteal bypass graft ([place]) 05/12/2020   Critical lower limb ischemia ([place]) 12/11/2016   Claudication in peripheral vascular disease ([place]) 11/28/2016   HTN (hypertension) 10/09/2014   Tobacco use 10/09/2014   PAD (peripheral artery disease) ([place])     REFERRING DIAG: 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  He had to put the prosthesis on 2-3 times before it felt right.  His leg also bothers him for first few steps if he has been sitting a while.    PAIN:  Are you having pain?  Yes: NPRS scale: arrived to PT  0/10. He reports waking up  in morning due to pain. Pain 0/10 since last PT Pain location: distal residual limb Pain description: cramping when he wakes up, soreness walking & aches when sitting with prosthesis. Aggravating factors: walking with sudden increases in time Relieving factors:  wearing prosthesis  OBJECTIVE:    POSTURE: 05/19/21:  rounded shoulders, forward head, flexed trunk , and weight shift right   LE ROM:   ROM P:passive  A:active Right 05/19/2021 Left 05/19/2021  Hip flexion      Hip extension   -15* standing  Hip abduction      Hip adduction      Hip internal rotation      Hip external rotation      Knee flexion      Knee extension      Ankle dorsiflexion      Ankle plantarflexion      Ankle inversion      Ankle eversion       (Blank rows = not tested) MMT:   MMT Right 05/19/2021 Left 05/19/2021  Hip flexion      Hip extension   3/5 Gross functional  Hip abduction   3/5 Gross functional  Hip adduction      Hip internal rotation      Hip external rotation      Knee flexion      Knee extension      Ankle dorsiflexion      Ankle plantarflexion  Ankle inversion      Ankle eversion      (Blank rows = not tested)   TRANSFERS: Sit to stand: 05/19/21:  SBA and requires UE assist with armrests & RW touch to stabilize Stand to sit: 05/19/21:  SBA and requires UE assist with armrests & RW stabilization   GAIT: 05/19/21:  Gait pattern: step to pattern, decreased step length- Right, decreased stance time- Left, decreased hip/knee flexion- Left, circumduction- Left, Left hip hike, antalgic, trunk flexed, abducted- Left, and poor foot clearance- Left  Distance walked: 80' Assistive device utilized: Environmental consultant - 2 wheeled and TFA prosthesis  excessive UE weight bearing Level of assistance: SBA   FUNCTIONAL TESTs:  Berg Balance Scale: 05/19/21:  17/56   [facility] PT Assessment - 05/19/21 0900                Standardized Balance Assessment    Standardized Balance Assessment Berg Balance Test          Berg Balance Test    Sit to Stand Needs minimal aid to stand or to stabilize     Standing Unsupported Able to stand 2 minutes with supervision     Sitting with Back Unsupported but Feet Supported on Floor or Stool Able to sit safely and securely 2  minutes     Stand to Sit Controls descent by using hands     Transfers Able to transfer safely, definite need of hands     Standing Unsupported with Eyes Closed Unable to keep eyes closed 3 seconds but stays steady     Standing Unsupported with Feet Together Needs help to attain position but able to stand for 30 seconds with feet together     From Standing, Reach Forward with Outstretched Arm Reaches forward but needs supervision     From Standing Position, Pick up Object from Floor Unable to try/needs assist to keep balance     From Standing Position, Turn to Look Behind Over each Shoulder Needs assist to keep from losing balance and falling     Turn 360 Degrees Needs assistance while turning     Standing Unsupported, Alternately Place Feet on Step/Stool Needs assistance to keep from falling or unable to try     Standing Unsupported, One Foot in [employer] balance while stepping or standing     Standing on One Leg Unable to try or needs assist to prevent fall     Total Score 17     Berg comment: BERG  < 36 high risk for falls (close to 100%) 46-51 moderate (>50%)   37-45 significant (>80%) 52-55 lower (> 25%)                   CURRENT PROSTHETIC WEAR ASSESSMENT: 05/19/21:  Patient is dependent with: skin check, residual limb care, care of non-amputated limb, prosthetic cleaning, ply sock cleaning, correct ply sock adjustment, proper wear schedule/adjustment, and proper weight-bearing schedule/adjustment Donning prosthesis: SBA Doffing prosthesis: Modified independence Prosthetic wear tolerance: 5-6 hours/day, 16 of 16 days since delivery.  Prosthetic weight bearing tolerance: 5 minutes with partial weight on prosthesis & intermittent support with RW Residual limb condition: 2 invaginated areas with one not clean, 1 small area on incision that may be suture working way out, dry skin, cylinderical shape, nonpitting edema,  Prosthetic description: silicon liner with velcro lanyard, ischial  containment socket with flexible inner liner, SAFETY knee (single axis nonhydraulic), K2 flexible keel foot     TODAY'S TREATMENT:  08/01/2021 Prosthetic  Training with Transfemoral Prosthesis: PT demo & verbal cues on weight shifting onto prosthesis for 2-3 sec 5 times prior to walking to seat his limb into the socket prior to walking esp when sitting >5 min. Pt verbalized & return demo understanding.  Pt ambulated 100' with HHA & cane with cues on upright posture & wt shift over prosthesis in stance.  Pt ambulated 15' X 2 with 180* turn inside //bars without touching with cane with supervision.  Pt amb 100' without HHA with close supervision and was able to self correct 3 balance losses with missteps.   Therex: Trunk ext: eccentric control reaching with control to chair bottom in front and concentric arising 10 reps with PT supervision.  Neuromuscular Re-ed:  stepping prosthesis forward with weight bearing for balance for 5 sec then step prosthesis back beside RLE without UE support, then stance on prosthesis stepping RLE forward with balance for 5 sec then step RLE back beside prosthesis with cane support. 5 reps. PT demo & verbal cues prior and verbal, mirror & tactile / contact assist during activity.  stepping prosthesis abduction with weight bearing for balance for 5 sec then step prosthesis back beside RLE without UE support, then stance on prosthesis stepping RLE abduction with balance for 5 sec then step RLE back beside prosthesis with cane support. 5 reps. PT demo & verbal cues prior and verbal, mirror & tactile / contact assist during activity.  Turning 180* with cane CW & CCW - PT demo & verbal cues on technique. Pt turned both directions 3 times with supervision.  07/28/2021 Prosthetic Training with Transfemoral Prosthesis: Pt ambulated 100' X 2 with HHA & cane with cues on upright posture & wt shift over prosthesis in stance. Progressed to no HHA for 20' with min guard.  Pt ambulated  15' X 2 with 180* turn inside //bars without touching with cane with supervision.   Therex: Trunk ext: eccentric control reaching with control to chair bottom in front and concentric arising 10 reps with PT supervision.  Neuromuscular Re-ed:  stepping prosthesis forward with weight bearing for balance for 5 sec then step prosthesis back beside RLE without UE support, then stance on prosthesis stepping RLE forward with balance for 5 sec then step RLE back beside prosthesis with cane support. 5 reps. PT demo & verbal cues prior and verbal, mirror & tactile / contact assist during activity.  stepping prosthesis abduction with weight bearing for balance for 5 sec then step prosthesis back beside RLE without UE support, then stance on prosthesis stepping RLE abduction with balance for 5 sec then step RLE back beside prosthesis with cane support. 5 reps. PT demo & verbal cues prior and verbal, mirror & tactile / contact assist during activity.   07/26/2021 Prosthetic Training with Transfemoral Prosthesis: Pt ambulated 100' with HHA & cane with cues on upright posture & wt shift over prosthesis in stance. Progressed to no HHA for 20' with min guard.  Pt ambulated 15' with 180* turn inside //bars without touching with cane with supervision.  Pt able to abd & add prosthesis with cane support 5 reps.  PT demo & verbal cues on technique to pick up items on floor.  Pt performed 3 reps (first with min guard & 2nd/3rd with supervision).  PT demo set up for home to practice safely. Pt verbalized understanding.  Pt ambulated 100' with cane carrying open bottle of water with supervision.   Therex: Nu step seat 7 with BLEs & BUEs level  7 for 8 min.  Trunk ext: eccentric control reaching with control to chair bottom in front and concentric arising 5 reps with PT supervision.     07/04/21 1250  PT Education  Education Details Access Code: [email or web address]  Person(s) Educated Patient  Methods  Explanation;Demonstration;Tactile cues;Verbal cues;Handout  Comprehension Verbalized understanding;Returned demonstration;Verbal cues required;Tactile cues required;Need further instruction   Access Code: [email or web address] URL: [email or web address] Date: 07/04/2021 Prepared by: Jamey Reas  Exercises - Seated Hamstring Stretch with Strap  - 2 x daily - 7 x weekly - 1 sets - 2-3 reps - 30 seconds hold - Modified Job[place] Stretch  - 2 x daily - 7 x weekly - 1 sets - 2 reps - 30 seconds hold - Supine Dynamic Modified Jun Quad and Hip Flexor Dynamic Stretch  - 2 x daily - 7 x weekly - 1 sets - 2 reps - 30 seconds hold - Standing posture with back to counter  - 2 x daily - 7 x weekly - 1 sets - 1 reps - 5-10 minutes hold - Upright Stance at Door Frame Single Arm  - 3-6 x daily - 7 x weekly - 1 sets - 2 reps - 2 deep breathes hold - Upright Stance at Door Frame with Both Arms  - 3-6 x daily - 7 x weekly - 1 sets - 2 reps - 2 deep breathes hold - feet together eyes open on floor  - 1 x daily - 4-7 x weekly - 1 sets - 10 reps - 2 seconds hold - Feet Apart with Eyes Closed with Head Motions  - 1 x daily - 4-7 x weekly - 1 sets - 10 reps - 2 seconds hold - Wide stance on Foam Pad head movements  - 1 x daily - 4-7 x weekly - 1 sets - 10 reps - 2 seconds hold - Standing Quarter Turn with Counter Support  - 1 x daily - 4-7 x weekly - 1 sets - 10 reps - Side Stepping with Counter Support  - 1 x daily - 5-7 x weekly - 1 sets - 10 reps - Backward Walking with Counter Support  - 1 x daily - 5-7 x weekly - 1 sets - 10 reps    05/31/21 1426  PT Education  Education Details HEP for posture & hip flexor stretch  Access Code: [email or web address]  Person(s) Educated Patient;Spouse  Methods Explanation;Demonstration;Tactile cues;Verbal cues;Handout  Comprehension Verbalized understanding;Returned demonstration;Tactile cues required;Verbal cues required;Need further instruction   Access Code: [email or web address] URL:  [email or web address] Date: 05/31/2021 Prepared by: Jamey Reas  Exercises - Seated Hamstring Stretch with Strap  - 2 x daily - 7 x weekly - 1 sets - 2-3 reps - 30 seconds hold - Modified [place] Stretch  - 2 x daily - 7 x weekly - 1 sets - 2 reps - 30 seconds hold - Supine Dynamic Modified [place] Quad and Hip Flexor Dynamic Stretch  - 2 x daily - 7 x weekly - 1 sets - 2 reps - 30 seconds hold - Standing posture with back to counter  - 2 x daily - 7 x weekly - 1 sets - 1 reps - 5-10 minutes hold - Upright Stance at Door Frame Single Arm  - 3-6 x daily - 7 x weekly - 1 sets - 2 reps - 2 deep breathes hold - Upright Stance at Door Frame with Both Arms  - 3-6 x daily - 7 x weekly - 1 sets - 2 reps - 2 deep breathes  hold   05/24/21 1151  PT Education  Education Details HEP at sink & residual limb pain vs phantom sensation vs phantom pain  Person(s) Educated Patient  Methods Explanation;Demonstration;Tactile cues;Verbal cues;Handout  Comprehension Verbalized understanding;Returned demonstration;Verbal cues required;Tactile cues required;Need further instruction   ASSESSMENT:   CLINICAL IMPRESSION: Patient improved his ability to ambulate with cane including correctly missteps.  He appears to have better understanding for issues related to residual limb pain. He has less limb pain issues limiting his mobility.    OBJECTIVE IMPAIRMENTS Abnormal gait, decreased activity tolerance, decreased balance, decreased endurance, decreased knowledge of condition, decreased knowledge of use of DME, decreased mobility, decreased ROM, decreased strength, impaired flexibility, postural dysfunction, prosthetic dependency , and pain.    ACTIVITY LIMITATIONS community activity and occupation.    PERSONAL FACTORS Fitness, Time since onset of injury/illness/exacerbation, and 3+ comorbidities: see PMH  are also affecting patient's functional outcome.    REHAB POTENTIAL: Good   CLINICAL DECISION  MAKING: Evolving/moderate complexity   EVALUATION COMPLEXITY: Moderate     GOALS: Goals reviewed with patient? Yes   SHORT TERM GOALS: Target date: 07/14/2021   Patient verbalized proper adjustment of ply socks. Goal status: MET 07/21/2021 2.  Patient tolerates prosthesis >90% awake hrs /day without skin issues or limb pain </= 1/10 after standing / walking. Goal status: 6/15/223 met for time & no skin issues but ongoing for limb pain.    3.  Patient able to pick up items from floor without UE support with supervision. Goal status: ongoing 07/21/2021   4. Patient ambulates 100' with cane stand alone tip & prosthesis with min guard. Goal status: oongoing 07/21/2021  LONG TERM GOALS: Target date: 08/17/2021   Patient demonstrates & verbalized understanding of prosthetic care to enable safe utilization of prosthesis. Baseline: SEE OBJECTIVE DATA Goal status: ongoing 07/26/21   Patient tolerates prosthesis wear >90% of awake hours without skin or limb pain issues. Baseline: SEE OBJECTIVE DATA Goal status: ongoing 07/26/21  Berg Balance >/= 30/56 to indicate lower fall risk Baseline: SEE OBJECTIVE DATA Goal status: ongoing 07/26/21   Patient ambulates >500' with LRAD & prosthesis modified independent. Baseline: SEE OBJECTIVE DATA Goal status: ongoing 07/26/21   Patient negotiates ramps, curbs & stairs with single rail with LRAD & prosthesis modified independent. Baseline: SEE OBJECTIVE DATA Goal status: Iongoing 07/26/21   PLAN: PT FREQUENCY: 2x/week   PT DURATION: 12 weeks   PLANNED INTERVENTIONS: Therapeutic exercises, Therapeutic activity, Neuromuscular re-education, Balance training, Gait training, Patient/Family education, Stair training, Vestibular training, Prosthetic training, and DME instructions   PLAN FOR NEXT SESSION: continue work towards LTGs including balance & gait with cane for household & RW for community.    Jamey Reas, PT, DPT 08/01/2021, 5:04 PM

## 2021-08-04 ENCOUNTER — Encounter: Payer: Self-pay | Admitting: Physical Therapy

## 2021-08-04 ENCOUNTER — Ambulatory Visit (INDEPENDENT_AMBULATORY_CARE_PROVIDER_SITE_OTHER): Payer: Medicare Other | Admitting: Physical Therapy

## 2021-08-04 DIAGNOSIS — R2681 Unsteadiness on feet: Secondary | ICD-10-CM

## 2021-08-04 DIAGNOSIS — R293 Abnormal posture: Secondary | ICD-10-CM | POA: Diagnosis not present

## 2021-08-04 DIAGNOSIS — R2689 Other abnormalities of gait and mobility: Secondary | ICD-10-CM

## 2021-08-04 DIAGNOSIS — M6281 Muscle weakness (generalized): Secondary | ICD-10-CM

## 2021-08-04 NOTE — Therapy (Signed)
OUTPATIENT PHYSICAL THERAPY PROSTHETIC TREATMENT NOTE   Patient Name: Ryan [name] MRN: [id number] DOB:[birth year], 71 y.o., male Today's Date: 08/04/2021  PCP: Iona Beard, MD REFERRING PROVIDER: Karoline Caldwell, PA-C      PT End of Session - 08/04/21 0922     Visit Number 23    Number of Visits 25    Date for PT Re-Evaluation 08/17/21    Authorization Type Medicare A/B & Generic commercial    Progress Note Due on Visit 30    PT Start Time 0920    PT Stop Time 1005    PT Time Calculation (min) 45 min    Equipment Utilized During Treatment Gait belt    Activity Tolerance Patient tolerated treatment well    Behavior During Therapy WFL for tasks assessed/performed                              Past Medical History:  Diagnosis Date   COVID 2022   Hypertension    PVD (peripheral vascular disease) ([place])    Past Surgical History:  Procedure Laterality Date   ABDOMINAL AORTAGRAM Left 12/13/2016   ABDOMINAL AORTOGRAM W/LOWER EXTREMITY/notes 12/13/2016   ABDOMINAL AORTOGRAM W/LOWER EXTREMITY N/A 11/28/2016   Procedure: ABDOMINAL AORTOGRAM W/LOWER EXTREMITY;  Surgeon: Adrian Prows, MD;  Location: [place] [facility];  Service: Cardiovascular;  Laterality: N/A;  bilateral   ABDOMINAL AORTOGRAM W/LOWER EXTREMITY N/A 12/13/2016   Procedure: ABDOMINAL AORTOGRAM W/LOWER EXTREMITY;  Surgeon: Waynetta Sandy, MD;  Location: [place] [facility];  Service: Cardiovascular;  Laterality: N/A;  unilater left leg   ABDOMINAL AORTOGRAM W/LOWER EXTREMITY N/A 01/09/2018   Procedure: ABDOMINAL AORTOGRAM W/LOWER EXTREMITY;  Surgeon: Marty Heck, MD;  Location: [place] [facility];  Service: Cardiovascular;  Laterality: N/A;   ABDOMINAL AORTOGRAM W/LOWER EXTREMITY Left 09/22/2019   Procedure: ABDOMINAL AORTOGRAM W/LOWER EXTREMITY;  Surgeon: Waynetta Sandy, MD;  Location: [place] [facility];  Service: Cardiovascular;  Laterality: Left;   ABDOMINAL AORTOGRAM  W/LOWER EXTREMITY Left 12/02/2020   Procedure: ABDOMINAL AORTOGRAM W/LOWER EXTREMITY;  Surgeon: Marty Heck, MD;  Location: [place] [facility];  Service: Cardiovascular;  Laterality: Left;   ABDOMINAL AORTOGRAM W/LOWER EXTREMITY N/A 12/22/2020   Procedure: ABDOMINAL AORTOGRAM W/LOWER EXTREMITY;  Surgeon: Broadus John, MD;  Location: [place] [facility];  Service: Cardiovascular;  Laterality: N/A;   AMPUTATION Left 02/11/2021   Procedure: LEFT ABOVE KNEE AMPUTATION;  Surgeon: Marty Heck, MD;  Location: [place];  Service: Vascular;  Laterality: Left;   BYPASS GRAFT POPLITEAL TO TIBIAL Left 12/31/2020   Procedure: LEFT JUMP GRAFT REVISION BELOW KNEE POPLITEAL TO PERONEAL;  Surgeon: Marty Heck, MD;  Location: [place];  Service: Vascular;  Laterality: Left;   EMBOLIZATION Left 12/03/2020   Procedure: EMBOLIZATION;  Surgeon: Cherre Robins, MD;  Location: [place] [facility];  Service: Cardiovascular;  Laterality: Left;   FEMORAL-POPLITEAL BYPASS GRAFT Left 12/14/2016   Procedure: BYPASS GRAFT COMMON FEMORAL- BELOW KNEE POPLITEAL ARTERY with Bovine Patch to Below the knee poplitieal artery.;  Surgeon: Conrad [place], MD;  Location: [facility];  Service: Vascular;  Laterality: Left;   FEMORAL-POPLITEAL BYPASS GRAFT Left 01/28/2018   Procedure: BYPASS GRAFT FEMORAL-BELOW KNEE POPLITEAL ARTERY REDO with propaten vascular graft removable ring;  Surgeon: Marty Heck, MD;  Location: [place];  Service: Vascular;  Laterality: Left;   FEMUR SURGERY Right 2009   pt. has a rod in that  leg   LOWER EXTREMITY ANGIOGRAPHY Left 11/28/2016   Procedure: Lower Extremity Angiography;  Surgeon: Adrian Prows, MD;  Location: [place] [facility];  Service: Cardiovascular;  Laterality: Left;  lysis followup lower leg   PATCH ANGIOPLASTY Left 01/28/2018   Procedure: PATCH ANGIOPLASTY USING Rueben Bash BIOLOGIC PATCH;  Surgeon: Marty Heck, MD;  Location: [place];  Service: Vascular;  Laterality: Left;    PATCH ANGIOPLASTY Left 06/16/2020   Procedure: PATCH ANGIOPLASTY OF LEFT BELOW KNEE [place] ARTERY;  Surgeon: Marty Heck, MD;  Location: [place];  Service: Vascular;  Laterality: Left;   PERIPHERAL VASCULAR BALLOON ANGIOPLASTY  11/28/2016   Procedure: PERIPHERAL VASCULAR BALLOON ANGIOPLASTY;  Surgeon: Adrian Prows, MD;  Location: [place] [facility];  Service: Cardiovascular;;  SFA   PERIPHERAL VASCULAR BALLOON ANGIOPLASTY  12/03/2020   Procedure: PERIPHERAL VASCULAR BALLOON ANGIOPLASTY;  Surgeon: Cherre Robins, MD;  Location: [place] [facility];  Service: Cardiovascular;;  TP Trunk and Peroneal   PERIPHERAL VASCULAR CATHETERIZATION N/A 08/11/2014   Procedure: Lower Extremity Angiography;  Surgeon: Adrian Prows, MD;  Location: [place] [facility];  Service: Cardiovascular;  Laterality: N/A;   PERIPHERAL VASCULAR INTERVENTION Left 09/22/2019   Procedure: PERIPHERAL VASCULAR INTERVENTION;  Surgeon: Waynetta Sandy, MD;  Location: [place] [facility];  Service: Cardiovascular;  Laterality: Left;   PERIPHERAL VASCULAR INTERVENTION Left 09/23/2019   Procedure: PERIPHERAL VASCULAR INTERVENTION;  Surgeon: Serafina Mitchell, MD;  Location: [place] [facility];  Service: Cardiovascular;  Laterality: Left;   PERIPHERAL VASCULAR INTERVENTION Left 05/13/2020   Procedure: PERIPHERAL VASCULAR INTERVENTION;  Surgeon: Marty Heck, MD;  Location: [place] [facility];  Service: Cardiovascular;  Laterality: Left;   PERIPHERAL VASCULAR INTERVENTION Left 12/02/2020   Procedure: PERIPHERAL VASCULAR INTERVENTION;  Surgeon: Marty Heck, MD;  Location: [place] [facility];  Service: Cardiovascular;  Laterality: Left;   PERIPHERAL VASCULAR INTERVENTION  12/03/2020   Procedure: PERIPHERAL VASCULAR INTERVENTION;  Surgeon: Cherre Robins, MD;  Location: [place] [facility];  Service: Cardiovascular;;   PERIPHERAL VASCULAR INTERVENTION Left 12/22/2020   Procedure: PERIPHERAL VASCULAR INTERVENTION;   Surgeon: Broadus John, MD;  Location: [place] [facility];  Service: Cardiovascular;  Laterality: Left;   PERIPHERAL VASCULAR THROMBECTOMY  11/28/2016   Procedure: PERIPHERAL VASCULAR THROMBECTOMY;  Surgeon: Adrian Prows, MD;  Location: [place] [facility];  Service: Cardiovascular;;  Profunda   PERIPHERAL VASCULAR THROMBECTOMY  11/28/2016   Procedure: PERIPHERAL VASCULAR THROMBECTOMY;  Surgeon: Adrian Prows, MD;  Location: [place] [facility];  Service: Cardiovascular;;   PERIPHERAL VASCULAR THROMBECTOMY N/A 05/12/2020   Procedure: PERIPHERAL VASCULAR THROMBECTOMY-Left Leg;  Surgeon: Cherre Robins, MD;  Location: [place] [facility];  Service: Cardiovascular;  Laterality: N/A;   PERIPHERAL VASCULAR THROMBECTOMY N/A 05/13/2020   Procedure: PERIPHERAL VASCULAR THROMBECTOMY- LYSIS RECHECK;  Surgeon: Marty Heck, MD;  Location: [place] [facility];  Service: Cardiovascular;  Laterality: N/A;   PERIPHERAL VASCULAR THROMBECTOMY N/A 12/02/2020   Procedure: PERIPHERAL VASCULAR THROMBECTOMY;  Surgeon: Marty Heck, MD;  Location: [place] [facility];  Service: Cardiovascular;  Laterality: N/A;   PERIPHERAL VASCULAR THROMBECTOMY N/A 12/03/2020   Procedure: LYSIS RECHECK;  Surgeon: Cherre Robins, MD;  Location: [place] [facility];  Service: Cardiovascular;  Laterality: N/A;   PULMONARY THROMBECTOMY N/A 09/23/2019   Procedure: LYSIS RECHECK;  Surgeon: Serafina Mitchell, MD;  Location: [place] [facility];  Service: Cardiovascular;  Laterality: N/A;   THROMBECTOMY FEMORAL ARTERY Left 12/14/2016  Procedure: THROMBECTOMY PROFUNDA FEMORAL ARTERY;  Surgeon: Fransisco Hertz, MD;  Location: [facility];  Service: Vascular;  Laterality: Left;   THROMBECTOMY FEMORAL ARTERY Left 06/16/2020   Procedure: Redo exposure ot Left below knee popiteal artery;  Surgeon: Cephus Shelling, MD;  Location: [facility] OR;  Service: Vascular;  Laterality: Left;   THROMBECTOMY OF BYPASS GRAFT FEMORAL- POPLITEAL ARTERY Left 06/16/2020    Procedure: THROMBECTOMY OF BYPASS GRAFT FEMORAL-POPLITEAL ARTERY and TIBIAL ARTERY;  Surgeon: Cephus Shelling, MD;  Location: [facility];  Service: Vascular;  Laterality: Left;   THROMBECTOMY OF BYPASS GRAFT FEMORAL- POPLITEAL ARTERY Left 12/31/2020   Procedure: THROMBECTOMY OF LEFT COMMON FEMORAL TO BELOW KNEE POPLITEAL ARTERY BYPASS GRAFT;  Surgeon: Cephus Shelling, MD;  Location: [facility] OR;  Service: Vascular;  Laterality: Left;   VEIN HARVEST Left 12/31/2020   Procedure: Left Greater Saphenous Vein Harvest;  Surgeon: Cephus Shelling, MD;  Location: [facility] OR;  Service: Vascular;  Laterality: Left;   Patient Active Problem List   Diagnosis Date Noted   Peripheral artery disease (HCC) 12/31/2020   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   Thrombosis of femoro-popliteal bypass graft (HCC) 05/12/2020   Critical lower limb ischemia (HCC) 12/11/2016   Claudication in peripheral vascular disease (HCC) 11/28/2016   HTN (hypertension) 10/09/2014   Tobacco use 10/09/2014   PAD (peripheral artery disease) (HCC)     REFERRING DIAG: 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   PAIN:  Are you having pain?  Yes: NPRS scale: arrived to PT  1/10. Pain location: distal residual limb Pain description: cramping when he wakes up, soreness walking & aches when sitting with prosthesis. Aggravating factors: walking with sudden increases in time Relieving factors: wearing prosthesis  OBJECTIVE:    POSTURE: 05/19/21:  rounded shoulders, forward head, flexed trunk , and  weight shift right   LE ROM:   ROM P:passive  A:active Right 05/19/2021 Left 05/19/2021  Hip flexion      Hip extension   -15* standing  Hip abduction      Hip adduction      Hip internal rotation      Hip external rotation      Knee flexion      Knee extension      Ankle dorsiflexion      Ankle plantarflexion      Ankle inversion      Ankle eversion       (Blank rows = not tested) MMT:   MMT Right 05/19/2021 Left 05/19/2021  Hip flexion      Hip extension   3/5 Gross functional  Hip abduction   3/5 Gross functional  Hip adduction      Hip internal rotation      Hip external rotation      Knee flexion      Knee extension      Ankle dorsiflexion      Ankle plantarflexion      Ankle inversion      Ankle eversion      (Blank rows = not tested)  TRANSFERS: Sit to stand: 05/19/21:  SBA and requires UE assist with armrests & RW touch to stabilize Stand to sit: 05/19/21:  SBA and requires UE assist with armrests & RW stabilization   GAIT: 05/19/21:  Gait pattern: step to pattern, decreased step length- Right, decreased stance time- Left, decreased hip/knee flexion- Left, circumduction- Left, Left hip hike, antalgic, trunk flexed, abducted- Left, and poor foot clearance- Left  Distance walked: 64' Assistive device utilized: Environmental consultant - 2 wheeled and TFA prosthesis  excessive UE weight bearing Level of assistance: SBA   FUNCTIONAL TESTs:  Berg Balance Scale: 05/19/21:  17/56   [facility] PT Assessment - 05/19/21 0900                Standardized Balance Assessment    Standardized Balance Assessment Berg Balance Test          Berg Balance Test    Sit to Stand Needs minimal aid to stand or to stabilize     Standing Unsupported Able to stand 2 minutes with supervision     Sitting with Back Unsupported but Feet Supported on Floor or Stool Able to sit safely and securely 2 minutes     Stand to Sit Controls descent by using hands     Transfers Able to transfer safely, definite need  of hands     Standing Unsupported with Eyes Closed Unable to keep eyes closed 3 seconds but stays steady     Standing Unsupported with Feet Together Needs help to attain position but able to stand for 30 seconds with feet together     From Standing, Reach Forward with Outstretched Arm Reaches forward but needs supervision     From Standing Position, Pick up Object from Floor Unable to try/needs assist to keep balance     From Standing Position, Turn to Look Behind Over each Shoulder Needs assist to keep from losing balance and falling     Turn 360 Degrees Needs assistance while turning     Standing Unsupported, Alternately Place Feet on Step/Stool Needs assistance to keep from falling or unable to try     Standing Unsupported, One Foot in [employer] balance while stepping or standing     Standing on One Leg Unable to try or needs assist to prevent fall     Total Score 17     Berg comment: BERG  < 36 high risk for falls (close to 100%) 46-51 moderate (>50%)   37-45 significant (>80%) 52-55 lower (> 25%)                   CURRENT PROSTHETIC WEAR ASSESSMENT: 05/19/21:  Patient is dependent with: skin check, residual limb care, care of non-amputated limb, prosthetic cleaning, ply sock cleaning, correct ply sock adjustment, proper wear schedule/adjustment, and proper weight-bearing schedule/adjustment Donning prosthesis: SBA Doffing prosthesis: Modified independence Prosthetic wear tolerance: 5-6 hours/day, 16 of 16 days since delivery.  Prosthetic weight bearing tolerance: 5 minutes with partial weight on prosthesis & intermittent support with RW Residual limb condition: 2 invaginated areas with one not clean, 1 small area on incision that may be suture working way out, dry skin, cylinderical shape, nonpitting edema,  Prosthetic description: silicon liner with velcro lanyard, ischial containment socket with flexible inner liner, SAFETY knee (single axis nonhydraulic), K2 flexible keel foot      TODAY'S TREATMENT:  08/04/2021 Prosthetic Training with Transfemoral Prosthesis: PT demo & verbal cues on weight shifting onto prosthesis for 2-3 sec 5 times prior to  walking to seat his limb into the socket prior to walking esp when sitting >5 min.   Pt amb 100' X 3 without HHA with close supervision and was able to self correct 1 balance loss Pt ambulated 15' X 4 with 180* turn inside //bars without touching with cane with supervision.    Therex: Trunk ext: eccentric control reaching with control to chair bottom in front and concentric arising 10 reps with PT supervision. Nu step L7 X 6 min UE/LE for functional endurance Leg press machine DL 75# 2X15  Neuromuscular Re-ed:  stepping prosthesis forward with weight bearing for balance for 5 sec then step prosthesis back beside RLE without UE support, then stance on prosthesis stepping RLE forward with balance for 5 sec then step RLE back beside prosthesis with cane support. 10 reps each. PT demo & verbal cues prior and verbal, mirror & tactile / contact assist during activity.  Sidestepping with light UE support X 3 round trips in bars Turning 180* with cane CW & CCW - PT demo & verbal cues on technique. Pt turned both directions 3 times with supervision.  08/01/2021 Prosthetic Training with Transfemoral Prosthesis: PT demo & verbal cues on weight shifting onto prosthesis for 2-3 sec 5 times prior to walking to seat his limb into the socket prior to walking esp when sitting >5 min. Pt verbalized & return demo understanding.  Pt ambulated 100' with HHA & cane with cues on upright posture & wt shift over prosthesis in stance.  Pt ambulated 15' X 2 with 180* turn inside //bars without touching with cane with supervision.  Pt amb 100' without HHA with close supervision and was able to self correct 3 balance losses with missteps.   Therex: Trunk ext: eccentric control reaching with control to chair bottom in front and concentric arising 10 reps  with PT supervision.  Neuromuscular Re-ed:  stepping prosthesis forward with weight bearing for balance for 5 sec then step prosthesis back beside RLE without UE support, then stance on prosthesis stepping RLE forward with balance for 5 sec then step RLE back beside prosthesis with cane support. 10 reps. PT demo & verbal cues prior and verbal, mirror & tactile / contact assist during activity.  stepping prosthesis abduction with weight bearing for balance for 5 sec then step prosthesis back beside RLE without UE support, then stance on prosthesis stepping RLE abduction with balance for 5 sec then step RLE back beside prosthesis with cane support. 5 reps. PT demo & verbal cues prior and verbal, mirror & tactile / contact assist during activity.  Turning 180* with cane CW & CCW - PT demo & verbal cues on technique. Pt turned both directions 3 times with supervision.      07/04/21 1250  PT Education  Education Details Access Code: [email or web address]  Person(s) Educated Patient  Methods Explanation;Demonstration;Tactile cues;Verbal cues;Handout  Comprehension Verbalized understanding;Returned demonstration;Verbal cues required;Tactile cues required;Need further instruction   Access Code: [email or web address] URL: [email or web address] Date: 07/04/2021 Prepared by: Jamey Reas  Exercises - Seated Hamstring Stretch with Strap  - 2 x daily - 7 x weekly - 1 sets - 2-3 reps - 30 seconds hold - Modified [place] Stretch  - 2 x daily - 7 x weekly - 1 sets - 2 reps - 30 seconds hold - Supine Dynamic Modified [place] Quad and Hip Flexor Dynamic Stretch  - 2 x daily - 7 x weekly - 1 sets - 2 reps - 30 seconds hold - Standing posture with back  to counter  - 2 x daily - 7 x weekly - 1 sets - 1 reps - 5-10 minutes hold - Upright Stance at Door Frame Single Arm  - 3-6 x daily - 7 x weekly - 1 sets - 2 reps - 2 deep breathes hold - Upright Stance at Door Frame with Both Arms  - 3-6 x daily - 7 x weekly - 1 sets - 2  reps - 2 deep breathes hold - feet together eyes open on floor  - 1 x daily - 4-7 x weekly - 1 sets - 10 reps - 2 seconds hold - Feet Apart with Eyes Closed with Head Motions  - 1 x daily - 4-7 x weekly - 1 sets - 10 reps - 2 seconds hold - Wide stance on Foam Pad head movements  - 1 x daily - 4-7 x weekly - 1 sets - 10 reps - 2 seconds hold - Standing Quarter Turn with Counter Support  - 1 x daily - 4-7 x weekly - 1 sets - 10 reps - Side Stepping with Counter Support  - 1 x daily - 5-7 x weekly - 1 sets - 10 reps - Backward Walking with Counter Support  - 1 x daily - 5-7 x weekly - 1 sets - 10 reps    05/31/21 1426  PT Education  Education Details HEP for posture & hip flexor stretch  Access Code: [email or web address]  Person(s) Educated Patient;Spouse  Methods Explanation;Demonstration;Tactile cues;Verbal cues;Handout  Comprehension Verbalized understanding;Returned demonstration;Tactile cues required;Verbal cues required;Need further instruction   Access Code: [email or web address] URL: [email or web address] Date: 05/31/2021 Prepared by: Jamey Reas  Exercises - Seated Hamstring Stretch with Strap  - 2 x daily - 7 x weekly - 1 sets - 2-3 reps - 30 seconds hold - Modified [place] Stretch  - 2 x daily - 7 x weekly - 1 sets - 2 reps - 30 seconds hold - Supine Dynamic Modified [place] Quad and Hip Flexor Dynamic Stretch  - 2 x daily - 7 x weekly - 1 sets - 2 reps - 30 seconds hold - Standing posture with back to counter  - 2 x daily - 7 x weekly - 1 sets - 1 reps - 5-10 minutes hold - Upright Stance at Door Frame Single Arm  - 3-6 x daily - 7 x weekly - 1 sets - 2 reps - 2 deep breathes hold - Upright Stance at Door Frame with Both Arms  - 3-6 x daily - 7 x weekly - 1 sets - 2 reps - 2 deep breathes hold   05/24/21 1151  PT Education  Education Details HEP at sink & residual limb pain vs phantom sensation vs phantom pain  Person(s) Educated Patient  Methods Explanation;Demonstration;Tactile  cues;Verbal cues;Handout  Comprehension Verbalized understanding;Returned demonstration;Verbal cues required;Tactile cues required;Need further instruction   ASSESSMENT:   CLINICAL IMPRESSION: Patient improved his ability for ambulation with SPC without hand held assist. He only had one loss of balance with this today and was able to self correct to prevent falling. He does still need CGA with this and cues for weight shift onto his prosthesis as well as longer step length with Rt LE.   OBJECTIVE IMPAIRMENTS Abnormal gait, decreased activity tolerance, decreased balance, decreased endurance, decreased knowledge of condition, decreased knowledge of use of DME, decreased mobility, decreased ROM, decreased strength, impaired flexibility, postural dysfunction, prosthetic dependency , and pain.    ACTIVITY LIMITATIONS community activity and occupation.    PERSONAL FACTORS  Fitness, Time since onset of injury/illness/exacerbation, and 3+ comorbidities: see PMH  are also affecting patient's functional outcome.    REHAB POTENTIAL: Good   CLINICAL DECISION MAKING: Evolving/moderate complexity   EVALUATION COMPLEXITY: Moderate     GOALS: Goals reviewed with patient? Yes   SHORT TERM GOALS: Target date: 07/14/2021   Patient verbalized proper adjustment of ply socks. Goal status: MET 07/21/2021 2.  Patient tolerates prosthesis >90% awake hrs /day without skin issues or limb pain </= 1/10 after standing / walking. Goal status: 6/15/223 met for time & no skin issues but ongoing for limb pain.    3.  Patient able to pick up items from floor without UE support with supervision. Goal status: ongoing 07/21/2021   4. Patient ambulates 100' with cane stand alone tip & prosthesis with min guard. Goal status: oongoing 07/21/2021  LONG TERM GOALS: Target date: 08/17/2021   Patient demonstrates & verbalized understanding of prosthetic care to enable safe utilization of prosthesis. Baseline: SEE OBJECTIVE  DATA Goal status: ongoing 07/26/21   Patient tolerates prosthesis wear >90% of awake hours without skin or limb pain issues. Baseline: SEE OBJECTIVE DATA Goal status: ongoing 07/26/21  Berg Balance >/= 30/56 to indicate lower fall risk Baseline: SEE OBJECTIVE DATA Goal status: ongoing 07/26/21   Patient ambulates >500' with LRAD & prosthesis modified independent. Baseline: SEE OBJECTIVE DATA Goal status: ongoing 07/26/21   Patient negotiates ramps, curbs & stairs with single rail with LRAD & prosthesis modified independent. Baseline: SEE OBJECTIVE DATA Goal status: Iongoing 07/26/21   PLAN: PT FREQUENCY: 2x/week   PT DURATION: 12 weeks   PLANNED INTERVENTIONS: Therapeutic exercises, Therapeutic activity, Neuromuscular re-education, Balance training, Gait training, Patient/Family education, Stair training, Vestibular training, Prosthetic training, and DME instructions   PLAN FOR NEXT SESSION: continue work towards LTGs including balance & gait with cane for household & RW for community.    Debbe Odea, PT, DPT 08/04/2021, 10:00 AM

## 2021-08-09 ENCOUNTER — Encounter: Payer: Self-pay | Admitting: Physical Therapy

## 2021-08-09 ENCOUNTER — Ambulatory Visit (INDEPENDENT_AMBULATORY_CARE_PROVIDER_SITE_OTHER): Payer: Medicare Other | Admitting: Physical Therapy

## 2021-08-09 DIAGNOSIS — R2689 Other abnormalities of gait and mobility: Secondary | ICD-10-CM

## 2021-08-09 DIAGNOSIS — R2681 Unsteadiness on feet: Secondary | ICD-10-CM

## 2021-08-09 DIAGNOSIS — R293 Abnormal posture: Secondary | ICD-10-CM | POA: Diagnosis not present

## 2021-08-09 DIAGNOSIS — M6281 Muscle weakness (generalized): Secondary | ICD-10-CM

## 2021-08-09 NOTE — Therapy (Signed)
OUTPATIENT PHYSICAL THERAPY PROSTHETIC TREATMENT NOTE   Patient Name: Ryan [name] MRN: [id number] DOB:1951/01/19, 71 y.o., male Today's Date: 08/09/2021  PCP: Iona Beard, MD REFERRING PROVIDER: Karoline Caldwell, PA-C      PT End of Session - 08/09/21 0928     Visit Number 24    Number of Visits 25    Date for PT Re-Evaluation 08/17/21    Authorization Type Medicare A/B & Generic commercial    Progress Note Due on Visit 30    PT Start Time 0927    PT Stop Time 1014    PT Time Calculation (min) 47 min    Equipment Utilized During Treatment Gait belt    Activity Tolerance Patient tolerated treatment well    Behavior During Therapy [facility] for tasks assessed/performed                              Past Medical History:  Diagnosis Date   COVID 2022   Hypertension    PVD (peripheral vascular disease) ([place])    Past Surgical History:  Procedure Laterality Date   ABDOMINAL AORTAGRAM Left 12/13/2016   ABDOMINAL AORTOGRAM W/LOWER EXTREMITY/notes 12/13/2016   ABDOMINAL AORTOGRAM W/LOWER EXTREMITY N/A 11/28/2016   Procedure: ABDOMINAL AORTOGRAM W/LOWER EXTREMITY;  Surgeon: Adrian Prows, MD;  Location: [place] [facility];  Service: Cardiovascular;  Laterality: N/A;  bilateral   ABDOMINAL AORTOGRAM W/LOWER EXTREMITY N/A 12/13/2016   Procedure: ABDOMINAL AORTOGRAM W/LOWER EXTREMITY;  Surgeon: Waynetta Sandy, MD;  Location: [place] [facility];  Service: Cardiovascular;  Laterality: N/A;  unilater left leg   ABDOMINAL AORTOGRAM W/LOWER EXTREMITY N/A 01/09/2018   Procedure: ABDOMINAL AORTOGRAM W/LOWER EXTREMITY;  Surgeon: Marty Heck, MD;  Location: [place] [facility];  Service: Cardiovascular;  Laterality: N/A;   ABDOMINAL AORTOGRAM W/LOWER EXTREMITY Left 09/22/2019   Procedure: ABDOMINAL AORTOGRAM W/LOWER EXTREMITY;  Surgeon: Waynetta Sandy, MD;  Location: [place] [facility];  Service: Cardiovascular;  Laterality: Left;   ABDOMINAL AORTOGRAM  W/LOWER EXTREMITY Left 12/02/2020   Procedure: ABDOMINAL AORTOGRAM W/LOWER EXTREMITY;  Surgeon: Marty Heck, MD;  Location: [place] [facility];  Service: Cardiovascular;  Laterality: Left;   ABDOMINAL AORTOGRAM W/LOWER EXTREMITY N/A 12/22/2020   Procedure: ABDOMINAL AORTOGRAM W/LOWER EXTREMITY;  Surgeon: Broadus John, MD;  Location: [place] [facility];  Service: Cardiovascular;  Laterality: N/A;   AMPUTATION Left 02/11/2021   Procedure: LEFT ABOVE KNEE AMPUTATION;  Surgeon: Marty Heck, MD;  Location: [place];  Service: Vascular;  Laterality: Left;   BYPASS GRAFT POPLITEAL TO TIBIAL Left 12/31/2020   Procedure: LEFT JUMP GRAFT REVISION BELOW KNEE POPLITEAL TO PERONEAL;  Surgeon: Marty Heck, MD;  Location: [place];  Service: Vascular;  Laterality: Left;   EMBOLIZATION Left 12/03/2020   Procedure: EMBOLIZATION;  Surgeon: Cherre Robins, MD;  Location: [place] [facility];  Service: Cardiovascular;  Laterality: Left;   FEMORAL-POPLITEAL BYPASS GRAFT Left 12/14/2016   Procedure: BYPASS GRAFT COMMON FEMORAL- BELOW KNEE POPLITEAL ARTERY with Bovine Patch to Below the knee poplitieal artery.;  Surgeon: Conrad Breckenridge, MD;  Location: [facility];  Service: Vascular;  Laterality: Left;   FEMORAL-POPLITEAL BYPASS GRAFT Left 01/28/2018   Procedure: BYPASS GRAFT FEMORAL-BELOW KNEE POPLITEAL ARTERY REDO with propaten vascular graft removable ring;  Surgeon: Marty Heck, MD;  Location: [place];  Service: Vascular;  Laterality: Left;   FEMUR SURGERY Right 2009   pt. has a rod in that  leg   LOWER EXTREMITY ANGIOGRAPHY Left 11/28/2016   Procedure: Lower Extremity Angiography;  Surgeon: Adrian Prows, MD;  Location: [place] [facility];  Service: Cardiovascular;  Laterality: Left;  lysis followup lower leg   PATCH ANGIOPLASTY Left 01/28/2018   Procedure: PATCH ANGIOPLASTY USING Rueben Bash BIOLOGIC PATCH;  Surgeon: Marty Heck, MD;  Location: [place];  Service: Vascular;  Laterality: Left;    PATCH ANGIOPLASTY Left 06/16/2020   Procedure: PATCH ANGIOPLASTY OF LEFT BELOW KNEE [place] ARTERY;  Surgeon: Marty Heck, MD;  Location: [place];  Service: Vascular;  Laterality: Left;   PERIPHERAL VASCULAR BALLOON ANGIOPLASTY  11/28/2016   Procedure: PERIPHERAL VASCULAR BALLOON ANGIOPLASTY;  Surgeon: Adrian Prows, MD;  Location: [place] [facility];  Service: Cardiovascular;;  SFA   PERIPHERAL VASCULAR BALLOON ANGIOPLASTY  12/03/2020   Procedure: PERIPHERAL VASCULAR BALLOON ANGIOPLASTY;  Surgeon: Cherre Robins, MD;  Location: [place] [facility];  Service: Cardiovascular;;  TP Trunk and Peroneal   PERIPHERAL VASCULAR CATHETERIZATION N/A 08/11/2014   Procedure: Lower Extremity Angiography;  Surgeon: Adrian Prows, MD;  Location: [place] [facility];  Service: Cardiovascular;  Laterality: N/A;   PERIPHERAL VASCULAR INTERVENTION Left 09/22/2019   Procedure: PERIPHERAL VASCULAR INTERVENTION;  Surgeon: Waynetta Sandy, MD;  Location: [place] [facility];  Service: Cardiovascular;  Laterality: Left;   PERIPHERAL VASCULAR INTERVENTION Left 09/23/2019   Procedure: PERIPHERAL VASCULAR INTERVENTION;  Surgeon: Serafina Mitchell, MD;  Location: [place] [facility];  Service: Cardiovascular;  Laterality: Left;   PERIPHERAL VASCULAR INTERVENTION Left 05/13/2020   Procedure: PERIPHERAL VASCULAR INTERVENTION;  Surgeon: Marty Heck, MD;  Location: [place] [facility];  Service: Cardiovascular;  Laterality: Left;   PERIPHERAL VASCULAR INTERVENTION Left 12/02/2020   Procedure: PERIPHERAL VASCULAR INTERVENTION;  Surgeon: Marty Heck, MD;  Location: [place] [facility];  Service: Cardiovascular;  Laterality: Left;   PERIPHERAL VASCULAR INTERVENTION  12/03/2020   Procedure: PERIPHERAL VASCULAR INTERVENTION;  Surgeon: Cherre Robins, MD;  Location: [place] [facility];  Service: Cardiovascular;;   PERIPHERAL VASCULAR INTERVENTION Left 12/22/2020   Procedure: PERIPHERAL VASCULAR INTERVENTION;   Surgeon: Broadus John, MD;  Location: [place] [facility];  Service: Cardiovascular;  Laterality: Left;   PERIPHERAL VASCULAR THROMBECTOMY  11/28/2016   Procedure: PERIPHERAL VASCULAR THROMBECTOMY;  Surgeon: Adrian Prows, MD;  Location: [place] [facility];  Service: Cardiovascular;;  Profunda   PERIPHERAL VASCULAR THROMBECTOMY  11/28/2016   Procedure: PERIPHERAL VASCULAR THROMBECTOMY;  Surgeon: Adrian Prows, MD;  Location: [place] [facility];  Service: Cardiovascular;;   PERIPHERAL VASCULAR THROMBECTOMY N/A 05/12/2020   Procedure: PERIPHERAL VASCULAR THROMBECTOMY-Left Leg;  Surgeon: Cherre Robins, MD;  Location: [place] [facility];  Service: Cardiovascular;  Laterality: N/A;   PERIPHERAL VASCULAR THROMBECTOMY N/A 05/13/2020   Procedure: PERIPHERAL VASCULAR THROMBECTOMY- LYSIS RECHECK;  Surgeon: Marty Heck, MD;  Location: [place] [facility];  Service: Cardiovascular;  Laterality: N/A;   PERIPHERAL VASCULAR THROMBECTOMY N/A 12/02/2020   Procedure: PERIPHERAL VASCULAR THROMBECTOMY;  Surgeon: Marty Heck, MD;  Location: [place] [facility];  Service: Cardiovascular;  Laterality: N/A;   PERIPHERAL VASCULAR THROMBECTOMY N/A 12/03/2020   Procedure: LYSIS RECHECK;  Surgeon: Cherre Robins, MD;  Location: [place] [facility];  Service: Cardiovascular;  Laterality: N/A;   PULMONARY THROMBECTOMY N/A 09/23/2019   Procedure: LYSIS RECHECK;  Surgeon: Serafina Mitchell, MD;  Location: [place] [facility];  Service: Cardiovascular;  Laterality: N/A;   THROMBECTOMY FEMORAL ARTERY Left 12/14/2016  Procedure: THROMBECTOMY PROFUNDA FEMORAL ARTERY;  Surgeon: Conrad Winnett, MD;  Location: [place];  Service: Vascular;  Laterality: Left;   THROMBECTOMY FEMORAL ARTERY Left 06/16/2020   Procedure: Redo exposure ot Left below knee popiteal artery;  Surgeon: Marty Heck, MD;  Location: [facility];  Service: Vascular;  Laterality: Left;   THROMBECTOMY OF BYPASS GRAFT FEMORAL- POPLITEAL ARTERY Left 06/16/2020    Procedure: THROMBECTOMY OF BYPASS GRAFT FEMORAL-POPLITEAL ARTERY and TIBIAL ARTERY;  Surgeon: Marty Heck, MD;  Location: [place];  Service: Vascular;  Laterality: Left;   THROMBECTOMY OF BYPASS GRAFT FEMORAL- POPLITEAL ARTERY Left 12/31/2020   Procedure: THROMBECTOMY OF LEFT COMMON FEMORAL TO BELOW KNEE POPLITEAL ARTERY BYPASS GRAFT;  Surgeon: Marty Heck, MD;  Location: [place];  Service: Vascular;  Laterality: Left;   VEIN HARVEST Left 12/31/2020   Procedure: Left Greater Saphenous Vein Harvest;  Surgeon: Marty Heck, MD;  Location: [place];  Service: Vascular;  Laterality: Left;   Patient Active Problem List   Diagnosis Date Noted   Peripheral artery disease ([place]) 12/31/2020   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[place]) 05/12/2020   Thrombosis of femoro-popliteal bypass graft ([place]) 05/12/2020   Critical lower limb ischemia ([place]) 12/11/2016   Claudication in peripheral vascular disease ([place]) 11/28/2016   HTN (hypertension) 10/09/2014   Tobacco use 10/09/2014   PAD (peripheral artery disease) ([place])     REFERRING DIAG: 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  He wore prosthesis most of awake hours. He has appt with Staci Righter, [facility] in August.     PAIN:  Are you having pain?  Yes: NPRS scale: arrived to PT  0/10.  2 days ago 6/10 & yesterday 4.5/10 Pain location: distal residual limb Pain description: cramping when he wakes up, soreness walking & aches when sitting with prosthesis. Aggravating factors: walking with sudden increases in time Relieving factors: wearing prosthesis  OBJECTIVE:    POSTURE: 05/19/21:   rounded shoulders, forward head, flexed trunk , and weight shift right   LE ROM:   ROM P:passive  A:active Right 05/19/2021 Left 05/19/2021  Hip flexion      Hip extension   -15* standing  Hip abduction      Hip adduction      Hip internal rotation      Hip external rotation      Knee flexion      Knee extension      Ankle dorsiflexion      Ankle plantarflexion      Ankle inversion      Ankle eversion       (Blank rows = not tested) MMT:   MMT Right 05/19/2021 Left 05/19/2021  Hip flexion      Hip extension   3/5 Gross functional  Hip abduction   3/5 Gross functional  Hip adduction      Hip internal rotation      Hip external rotation      Knee flexion      Knee extension      Ankle dorsiflexion      Ankle plantarflexion      Ankle inversion      Ankle  eversion      (Blank rows = not tested)   TRANSFERS: Sit to stand: 05/19/21:  SBA and requires UE assist with armrests & RW touch to stabilize Stand to sit: 05/19/21:  SBA and requires UE assist with armrests & RW stabilization   GAIT: 08/09/2021: pt amb 516' in 9 min with RW & TFA prosthesis modified independent. Post gait HR 67 SpO2 97%  Gait velocity: 1.10 ft/sec  He had one misstep that he self corrected.    Pattern: step to with decreased right step length, left hip hike.  Good prosthetic foot clearance, minimal abduction intermittently noted.   05/19/21:  Gait pattern: step to pattern, decreased step length- Right, decreased stance time- Left, decreased hip/knee flexion- Left, circumduction- Left, Left hip hike, antalgic, trunk flexed, abducted- Left, and poor foot clearance- Left  Distance walked: 86' Assistive device utilized: Environmental consultant - 2 wheeled and TFA prosthesis  excessive UE weight bearing Level of assistance: SBA   FUNCTIONAL TESTs:  Berg Balance 08/09/2021  34/56 & tasks of Berg with cane support 46/56  [facility] PT Assessment - 08/09/21 0930       Standardized Balance Assessment   Standardized Balance  Assessment Berg Balance Test      Berg Balance Test   Sit to Stand Able to stand  independently using hands    Standing Unsupported Able to stand safely 2 minutes    Sitting with Back Unsupported but Feet Supported on Floor or Stool Able to sit safely and securely 2 minutes    Stand to Sit Controls descent by using hands    Transfers Able to transfer safely, definite need of hands    Standing Unsupported with Eyes Closed Able to stand 10 seconds safely    Standing Unsupported with Feet Together Able to place feet together independently and stand 1 minute safely    From Standing, Reach Forward with Outstretched Arm Can reach confidently >25 cm (10")    From Standing Position, Pick up Object from Floor Unable to pick up and needs supervision   with cane support =3   From Standing Position, Turn to Look Behind Over each Shoulder Turn sideways only but maintains balance   with cane support =3   Turn 360 Degrees Needs assistance while turning   with cane support =2   Standing Unsupported, Alternately Place Feet on Step/Stool Needs assistance to keep from falling or unable to try   with cane support = 2   Standing Unsupported, One Foot in Front Needs help to step but can hold 15 seconds   with cane support =3   Standing on One Leg Tries to lift leg/unable to hold 3 seconds but remains standing independently   with cane support =4   Total Score 34    Berg comment: 7/11/2023Merrilee Jansky tasks with cane support 46/56   on 05/19/21 Merrilee Jansky was 17/56              Berg Balance Scale: 05/19/21:  17/56   [facility] PT Assessment - 05/19/21 0900                Standardized Balance Assessment    Standardized Balance Assessment Berg Balance Test          Berg Balance Test    Sit to Stand Needs minimal aid to stand or to stabilize     Standing Unsupported Able to stand 2 minutes with supervision     Sitting with Back Unsupported but Feet Supported on Floor or  Stool Able to sit safely and securely 2 minutes      Stand to Sit Controls descent by using hands     Transfers Able to transfer safely, definite need of hands     Standing Unsupported with Eyes Closed Unable to keep eyes closed 3 seconds but stays steady     Standing Unsupported with Feet Together Needs help to attain position but able to stand for 30 seconds with feet together     From Standing, Reach Forward with Outstretched Arm Reaches forward but needs supervision     From Standing Position, Pick up Object from Floor Unable to try/needs assist to keep balance     From Standing Position, Turn to Look Behind Over each Shoulder Needs assist to keep from losing balance and falling     Turn 360 Degrees Needs assistance while turning     Standing Unsupported, Alternately Place Feet on Step/Stool Needs assistance to keep from falling or unable to try     Standing Unsupported, One Foot in [employer] balance while stepping or standing     Standing on One Leg Unable to try or needs assist to prevent fall     Total Score 17     Berg comment: BERG  < 36 high risk for falls (close to 100%) 46-51 moderate (>50%)   37-45 significant (>80%) 52-55 lower (> 25%)                   CURRENT PROSTHETIC WEAR ASSESSMENT: 05/19/21:  Patient is dependent with: skin check, residual limb care, care of non-amputated limb, prosthetic cleaning, ply sock cleaning, correct ply sock adjustment, proper wear schedule/adjustment, and proper weight-bearing schedule/adjustment Donning prosthesis: SBA Doffing prosthesis: Modified independence Prosthetic wear tolerance: 5-6 hours/day, 16 of 16 days since delivery.  Prosthetic weight bearing tolerance: 5 minutes with partial weight on prosthesis & intermittent support with RW Residual limb condition: 2 invaginated areas with one not clean, 1 small area on incision that may be suture working way out, dry skin, cylinderical shape, nonpitting edema,  Prosthetic description: silicon liner with velcro lanyard, ischial containment  socket with flexible inner liner, SAFETY knee (single axis nonhydraulic), K2 flexible keel foot     TODAY'S TREATMENT:  08/09/2021 Pt verbalized proper prosthetic care. He is wearing prosthesis >90% of awake hours without skin issues except some "itching proximal limb" probably from sweat.  He does have intermittent days with limb pain limiting his mobility but seems to be less number of days and lower number rating pain.   Neuromuscular Re-ed:  See Objective for Berg Balance test without AD & with cane  Gait:  See objective for distance gait with RW.  08/04/2021 Prosthetic Training with Transfemoral Prosthesis: PT demo & verbal cues on weight shifting onto prosthesis for 2-3 sec 5 times prior to walking to seat his limb into the socket prior to walking esp when sitting >5 min.   Pt amb 100' X 3 without HHA with close supervision and was able to self correct 1 balance loss Pt ambulated 15' X 4 with 180* turn inside //bars without touching with cane with supervision.    Therex: Trunk ext: eccentric control reaching with control to chair bottom in front and concentric arising 10 reps with PT supervision. Nu step L7 X 6 min UE/LE for functional endurance Leg press machine DL 75# 2X15  Neuromuscular Re-ed:  stepping prosthesis forward with weight bearing for balance for 5 sec then step prosthesis back beside RLE without  UE support, then stance on prosthesis stepping RLE forward with balance for 5 sec then step RLE back beside prosthesis with cane support. 10 reps each. PT demo & verbal cues prior and verbal, mirror & tactile / contact assist during activity.  Sidestepping with light UE support X 3 round trips in bars Turning 180* with cane CW & CCW - PT demo & verbal cues on technique. Pt turned both directions 3 times with supervision.      07/04/21 1250  PT Education  Education Details Access Code: [email or web address]  Person(s) Educated Patient  Methods Explanation;Demonstration;Tactile  cues;Verbal cues;Handout  Comprehension Verbalized understanding;Returned demonstration;Verbal cues required;Tactile cues required;Need further instruction   Access Code: [email or web address] URL: [email or web address] Date: 07/04/2021 Prepared by: Jamey Reas  Exercises - Seated Hamstring Stretch with Strap  - 2 x daily - 7 x weekly - 1 sets - 2-3 reps - 30 seconds hold - Modified Del[place]id Stretch  - 2 x daily - 7 x weekly - 1 sets - 2 reps - 30 seconds hold - Supine Dynamic Modified Paver Quad and Hip Flexor Dynamic Stretch  - 2 x daily - 7 x weekly - 1 sets - 2 reps - 30 seconds hold - Standing posture with back to counter  - 2 x daily - 7 x weekly - 1 sets - 1 reps - 5-10 minutes hold - Upright Stance at Door Frame Single Arm  - 3-6 x daily - 7 x weekly - 1 sets - 2 reps - 2 deep breathes hold - Upright Stance at Door Frame with Both Arms  - 3-6 x daily - 7 x weekly - 1 sets - 2 reps - 2 deep breathes hold - feet together eyes open on floor  - 1 x daily - 4-7 x weekly - 1 sets - 10 reps - 2 seconds hold - Feet Apart with Eyes Closed with Head Motions  - 1 x daily - 4-7 x weekly - 1 sets - 10 reps - 2 seconds hold - Wide stance on Foam Pad head movements  - 1 x daily - 4-7 x weekly - 1 sets - 10 reps - 2 seconds hold - Standing Quarter Turn with Counter Support  - 1 x daily - 4-7 x weekly - 1 sets - 10 reps - Side Stepping with Counter Support  - 1 x daily - 5-7 x weekly - 1 sets - 10 reps - Backward Walking with Counter Support  - 1 x daily - 5-7 x weekly - 1 sets - 10 reps    05/31/21 1426  PT Education  Education Details HEP for posture & hip flexor stretch  Access Code: [email or web address]  Person(s) Educated Patient;Spouse  Methods Explanation;Demonstration;Tactile cues;Verbal cues;Handout  Comprehension Verbalized understanding;Returned demonstration;Tactile cues required;Verbal cues required;Need further instruction   Access Code: [email or web address] URL:  [email or web address] Date: 05/31/2021 Prepared by: Jamey Reas  Exercises - Seated Hamstring Stretch with Strap  - 2 x daily - 7 x weekly - 1 sets - 2-3 reps - 30 seconds hold - Modified [place] Stretch  - 2 x daily - 7 x weekly - 1 sets - 2 reps - 30 seconds hold - Supine Dynamic Modified Karl Pock and Hip Flexor Dynamic Stretch  - 2 x daily - 7 x weekly - 1 sets - 2 reps - 30 seconds hold - Standing posture with back to counter  - 2 x daily - 7 x weekly - 1 sets - 1 reps - 5-10 minutes hold - Upright Stance  at Door Frame Single Arm  - 3-6 x daily - 7 x weekly - 1 sets - 2 reps - 2 deep breathes hold - Upright Stance at Door Frame with Both Arms  - 3-6 x daily - 7 x weekly - 1 sets - 2 reps - 2 deep breathes hold   05/24/21 1151  PT Education  Education Details HEP at sink & residual limb pain vs phantom sensation vs phantom pain  Person(s) Educated Patient  Methods Explanation;Demonstration;Tactile cues;Verbal cues;Handout  Comprehension Verbalized understanding;Returned demonstration;Verbal cues required;Tactile cues required;Need further instruction   ASSESSMENT:   CLINICAL IMPRESSION: Patient improved Berg Balance Test to 34/56 from 17/56 which indicates decrease in fall risk with 8 point change but still places him in high fall risk category. The task of Berg with cane support 47/56 indicates lower fall risk for standing ADLs with single UE support.  He was able to ambulate >500' for basic community distances using RW & prosthesis.    OBJECTIVE IMPAIRMENTS Abnormal gait, decreased activity tolerance, decreased balance, decreased endurance, decreased knowledge of condition, decreased knowledge of use of DME, decreased mobility, decreased ROM, decreased strength, impaired flexibility, postural dysfunction, prosthetic dependency , and pain.    ACTIVITY LIMITATIONS community activity and occupation.    PERSONAL FACTORS Fitness, Time since onset of  injury/illness/exacerbation, and 3+ comorbidities: see PMH  are also affecting patient's functional outcome.    REHAB POTENTIAL: Good   CLINICAL DECISION MAKING: Evolving/moderate complexity   EVALUATION COMPLEXITY: Moderate     GOALS: Goals reviewed with patient? Yes   SHORT TERM GOALS: Target date: 07/14/2021   Patient verbalized proper adjustment of ply socks. Goal status: MET 07/21/2021 2.  Patient tolerates prosthesis >90% awake hrs /day without skin issues or limb pain </= 1/10 after standing / walking. Goal status: 6/15/223 met for time & no skin issues but ongoing for limb pain.    3.  Patient able to pick up items from floor without UE support with supervision. Goal status: ongoing 07/21/2021   4. Patient ambulates 100' with cane stand alone tip & prosthesis with min guard. Goal status: oongoing 07/21/2021  LONG TERM GOALS: Target date: 08/17/2021   Patient demonstrates & verbalized understanding of prosthetic care to enable safe utilization of prosthesis. Baseline: SEE OBJECTIVE DATA Goal status: met 08/09/2021   Patient tolerates prosthesis wear >90% of awake hours without skin or limb pain issues. Baseline: SEE OBJECTIVE DATA Goal status: partially met 08/09/2021   Merrilee Jansky Balance >/= 30/56 to indicate lower fall risk Baseline: SEE OBJECTIVE DATA Goal status: met 08/09/2021   Patient ambulates >500' with LRAD & prosthesis modified independent. Baseline: SEE OBJECTIVE DATA Goal status: met 08/09/2021   Patient negotiates ramps, curbs & stairs with single rail with LRAD & prosthesis modified independent. Baseline: SEE OBJECTIVE DATA Goal status: ongoing 08/09/2021   PLAN: PT FREQUENCY: 2x/week   PT DURATION: 12 weeks   PLANNED INTERVENTIONS: Therapeutic exercises, Therapeutic activity, Neuromuscular re-education, Balance training, Gait training, Patient/Family education, Stair training, Vestibular training, Prosthetic training, and DME instructions   PLAN FOR NEXT  SESSION:  discuss d/c vs recert, check remaining LTGs & gait with cane for household.   Jamey Reas, PT, DPT 08/09/2021, 12:39 PM

## 2021-08-11 ENCOUNTER — Encounter: Payer: Self-pay | Admitting: Physical Therapy

## 2021-08-11 ENCOUNTER — Ambulatory Visit (INDEPENDENT_AMBULATORY_CARE_PROVIDER_SITE_OTHER): Payer: Medicare Other | Admitting: Physical Therapy

## 2021-08-11 DIAGNOSIS — M79605 Pain in left leg: Secondary | ICD-10-CM | POA: Diagnosis not present

## 2021-08-11 DIAGNOSIS — R2689 Other abnormalities of gait and mobility: Secondary | ICD-10-CM | POA: Diagnosis not present

## 2021-08-11 DIAGNOSIS — M6281 Muscle weakness (generalized): Secondary | ICD-10-CM | POA: Diagnosis not present

## 2021-08-11 DIAGNOSIS — R2681 Unsteadiness on feet: Secondary | ICD-10-CM | POA: Diagnosis not present

## 2021-08-11 DIAGNOSIS — G546 Phantom limb syndrome with pain: Secondary | ICD-10-CM

## 2021-08-11 DIAGNOSIS — R293 Abnormal posture: Secondary | ICD-10-CM

## 2021-08-11 NOTE — Therapy (Signed)
OUTPATIENT PHYSICAL THERAPY PROSTHETIC TREATMENT NOTE, RE-CERTIFICATION & PROGRESS NOTE   Patient Name: Ryan [name] MRN: [id number] DOB:27-Mar-1950, 71 y.o., male Today's Date: 08/11/2021  PCP: Iona Beard, MD REFERRING PROVIDER: Karoline Caldwell, PA-C  Progress Note Reporting Period 07/28/2021 to 08/11/2021  See note below for Objective Data and Assessment of Progress/Goals.        PT End of Session - 08/11/21 0928     Visit Number 25    Number of Visits 41    Date for PT Re-Evaluation 10/06/21    Authorization Type Medicare A/B & Generic commercial    Progress Note Due on Visit 32    PT Start Time 0929    PT Stop Time 1015    PT Time Calculation (min) 46 min    Equipment Utilized During Treatment Gait belt    Activity Tolerance Patient tolerated treatment well    Behavior During Therapy WFL for tasks assessed/performed                               Past Medical History:  Diagnosis Date   COVID 2022   Hypertension    PVD (peripheral vascular disease) ([place])    Past Surgical History:  Procedure Laterality Date   ABDOMINAL AORTAGRAM Left 12/13/2016   ABDOMINAL AORTOGRAM W/LOWER EXTREMITY/notes 12/13/2016   ABDOMINAL AORTOGRAM W/LOWER EXTREMITY N/A 11/28/2016   Procedure: ABDOMINAL AORTOGRAM W/LOWER EXTREMITY;  Surgeon: Adrian Prows, MD;  Location: [place] [facility];  Service: Cardiovascular;  Laterality: N/A;  bilateral   ABDOMINAL AORTOGRAM W/LOWER EXTREMITY N/A 12/13/2016   Procedure: ABDOMINAL AORTOGRAM W/LOWER EXTREMITY;  Surgeon: Waynetta Sandy, MD;  Location: [place] [facility];  Service: Cardiovascular;  Laterality: N/A;  unilater left leg   ABDOMINAL AORTOGRAM W/LOWER EXTREMITY N/A 01/09/2018   Procedure: ABDOMINAL AORTOGRAM W/LOWER EXTREMITY;  Surgeon: Marty Heck, MD;  Location: [place] [facility];  Service: Cardiovascular;  Laterality: N/A;   ABDOMINAL AORTOGRAM W/LOWER EXTREMITY Left 09/22/2019   Procedure: ABDOMINAL  AORTOGRAM W/LOWER EXTREMITY;  Surgeon: Waynetta Sandy, MD;  Location: [place] [facility];  Service: Cardiovascular;  Laterality: Left;   ABDOMINAL AORTOGRAM W/LOWER EXTREMITY Left 12/02/2020   Procedure: ABDOMINAL AORTOGRAM W/LOWER EXTREMITY;  Surgeon: Marty Heck, MD;  Location: [place] [facility];  Service: Cardiovascular;  Laterality: Left;   ABDOMINAL AORTOGRAM W/LOWER EXTREMITY N/A 12/22/2020   Procedure: ABDOMINAL AORTOGRAM W/LOWER EXTREMITY;  Surgeon: Broadus John, MD;  Location: [place] [facility];  Service: Cardiovascular;  Laterality: N/A;   AMPUTATION Left 02/11/2021   Procedure: LEFT ABOVE KNEE AMPUTATION;  Surgeon: Marty Heck, MD;  Location: [place];  Service: Vascular;  Laterality: Left;   BYPASS GRAFT POPLITEAL TO TIBIAL Left 12/31/2020   Procedure: LEFT JUMP GRAFT REVISION BELOW KNEE POPLITEAL TO PERONEAL;  Surgeon: Marty Heck, MD;  Location: [place];  Service: Vascular;  Laterality: Left;   EMBOLIZATION Left 12/03/2020   Procedure: EMBOLIZATION;  Surgeon: Cherre Robins, MD;  Location: [place] [facility];  Service: Cardiovascular;  Laterality: Left;   FEMORAL-POPLITEAL BYPASS GRAFT Left 12/14/2016   Procedure: BYPASS GRAFT COMMON FEMORAL- BELOW KNEE POPLITEAL ARTERY with Bovine Patch to Below the knee poplitieal artery.;  Surgeon: Conrad Botetourt, MD;  [facility];  Service: Vascular;  Laterality: Left;   FEMORAL-POPLITEAL BYPASS GRAFT Left 01/28/2018   Procedure: BYPASS GRAFT FEMORAL-BELOW KNEE POPLITEAL ARTERY REDO with propaten vascular graft removable ring;  Surgeon: Monica Martinez  J, MD;  Location: [place];  Service: Vascular;  Laterality: Left;   FEMUR SURGERY Right 2009   pt. has a rod in that leg   LOWER EXTREMITY ANGIOGRAPHY Left 11/28/2016   Procedure: Lower Extremity Angiography;  Surgeon: Adrian Prows, MD;  Location: [place] [facility];  Service: Cardiovascular;  Laterality: Left;  lysis followup lower leg   PATCH ANGIOPLASTY  Left 01/28/2018   Procedure: PATCH ANGIOPLASTY USING Rueben Bash BIOLOGIC PATCH;  Surgeon: Marty Heck, MD;  Location: [place];  Service: Vascular;  Laterality: Left;   PATCH ANGIOPLASTY Left 06/16/2020   Procedure: PATCH ANGIOPLASTY OF LEFT BELOW KNEE [place] ARTERY;  Surgeon: Marty Heck, MD;  Location: [place];  Service: Vascular;  Laterality: Left;   PERIPHERAL VASCULAR BALLOON ANGIOPLASTY  11/28/2016   Procedure: PERIPHERAL VASCULAR BALLOON ANGIOPLASTY;  Surgeon: Adrian Prows, MD;  Location: [place] [facility];  Service: Cardiovascular;;  SFA   PERIPHERAL VASCULAR BALLOON ANGIOPLASTY  12/03/2020   Procedure: PERIPHERAL VASCULAR BALLOON ANGIOPLASTY;  Surgeon: Cherre Robins, MD;  Location: [place] [facility];  Service: Cardiovascular;;  TP Trunk and Peroneal   PERIPHERAL VASCULAR CATHETERIZATION N/A 08/11/2014   Procedure: Lower Extremity Angiography;  Surgeon: Adrian Prows, MD;  Location: [place] [facility];  Service: Cardiovascular;  Laterality: N/A;   PERIPHERAL VASCULAR INTERVENTION Left 09/22/2019   Procedure: PERIPHERAL VASCULAR INTERVENTION;  Surgeon: Waynetta Sandy, MD;  Location: [place] [facility];  Service: Cardiovascular;  Laterality: Left;   PERIPHERAL VASCULAR INTERVENTION Left 09/23/2019   Procedure: PERIPHERAL VASCULAR INTERVENTION;  Surgeon: Serafina Mitchell, MD;  Location: [place] [facility];  Service: Cardiovascular;  Laterality: Left;   PERIPHERAL VASCULAR INTERVENTION Left 05/13/2020   Procedure: PERIPHERAL VASCULAR INTERVENTION;  Surgeon: Marty Heck, MD;  Location: [place] [facility];  Service: Cardiovascular;  Laterality: Left;   PERIPHERAL VASCULAR INTERVENTION Left 12/02/2020   Procedure: PERIPHERAL VASCULAR INTERVENTION;  Surgeon: Marty Heck, MD;  Location: [place] [facility];  Service: Cardiovascular;  Laterality: Left;   PERIPHERAL VASCULAR INTERVENTION  12/03/2020   Procedure: PERIPHERAL VASCULAR INTERVENTION;  Surgeon: Cherre Robins, MD;  Location: [place] [facility];  Service: Cardiovascular;;   PERIPHERAL VASCULAR INTERVENTION Left 12/22/2020   Procedure: PERIPHERAL VASCULAR INTERVENTION;  Surgeon: Broadus John, MD;  Location: [place] [facility];  Service: Cardiovascular;  Laterality: Left;   PERIPHERAL VASCULAR THROMBECTOMY  11/28/2016   Procedure: PERIPHERAL VASCULAR THROMBECTOMY;  Surgeon: Adrian Prows, MD;  Location: [place] [facility];  Service: Cardiovascular;;  Profunda   PERIPHERAL VASCULAR THROMBECTOMY  11/28/2016   Procedure: PERIPHERAL VASCULAR THROMBECTOMY;  Surgeon: Adrian Prows, MD;  Location: [place] [facility];  Service: Cardiovascular;;   PERIPHERAL VASCULAR THROMBECTOMY N/A 05/12/2020   Procedure: PERIPHERAL VASCULAR THROMBECTOMY-Left Leg;  Surgeon: Cherre Robins, MD;  Location: [place] [facility];  Service: Cardiovascular;  Laterality: N/A;   PERIPHERAL VASCULAR THROMBECTOMY N/A 05/13/2020   Procedure: PERIPHERAL VASCULAR THROMBECTOMY- LYSIS RECHECK;  Surgeon: Marty Heck, MD;  Location: [place] [facility];  Service: Cardiovascular;  Laterality: N/A;   PERIPHERAL VASCULAR THROMBECTOMY N/A 12/02/2020   Procedure: PERIPHERAL VASCULAR THROMBECTOMY;  Surgeon: Marty Heck, MD;  Location: [place] [facility];  Service: Cardiovascular;  Laterality: N/A;   PERIPHERAL VASCULAR THROMBECTOMY N/A 12/03/2020   Procedure: LYSIS RECHECK;  Surgeon: Cherre Robins, MD;  Location: [place] [facility];  Service: Cardiovascular;  Laterality: N/A;   PULMONARY THROMBECTOMY N/A 09/23/2019   Procedure: LYSIS RECHECK;  Surgeon: Serafina Mitchell, MD;  Location: [place] [facility];  Service: Cardiovascular;  Laterality: N/A;   THROMBECTOMY FEMORAL ARTERY Left 12/14/2016   Procedure: THROMBECTOMY PROFUNDA FEMORAL ARTERY;  Surgeon: Conrad Irion, MD;  Location: [place];  Service: Vascular;  Laterality: Left;   THROMBECTOMY FEMORAL ARTERY Left 06/16/2020   Procedure: Redo exposure ot Left below knee popiteal  artery;  Surgeon: Marty Heck, MD;  Location: [facility];  Service: Vascular;  Laterality: Left;   THROMBECTOMY OF BYPASS GRAFT FEMORAL- POPLITEAL ARTERY Left 06/16/2020   Procedure: THROMBECTOMY OF BYPASS GRAFT FEMORAL-POPLITEAL ARTERY and TIBIAL ARTERY;  Surgeon: Marty Heck, MD;  Location: [place];  Service: Vascular;  Laterality: Left;   THROMBECTOMY OF BYPASS GRAFT FEMORAL- POPLITEAL ARTERY Left 12/31/2020   Procedure: THROMBECTOMY OF LEFT COMMON FEMORAL TO BELOW KNEE POPLITEAL ARTERY BYPASS GRAFT;  Surgeon: Marty Heck, MD;  Location: [place];  Service: Vascular;  Laterality: Left;   VEIN HARVEST Left 12/31/2020   Procedure: Left Greater Saphenous Vein Harvest;  Surgeon: Marty Heck, MD;  Location: [place];  Service: Vascular;  Laterality: Left;   Patient Active Problem List   Diagnosis Date Noted   Peripheral artery disease ([place]) 12/31/2020   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[place]) 05/12/2020   Thrombosis of femoro-popliteal bypass graft ([place]) 05/12/2020   Critical lower limb ischemia ([place]) 12/11/2016   Claudication in peripheral vascular disease ([place]) 11/28/2016   HTN (hypertension) 10/09/2014   Tobacco use 10/09/2014   PAD (peripheral artery disease) ([place])     REFERRING DIAG: 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 He would like to continue PT.  He had limb pain yesterday but not today.   PAIN:  Are you having pain?  Yes: NPRS scale: arrived to PT   0/10.   yesterday up to 2/10 Pain location: distal residual limb Pain  description: cramping when he wakes up, soreness walking & aches when sitting with prosthesis. Aggravating factors: walking with sudden increases in time Relieving factors: wearing prosthesis  OBJECTIVE:    POSTURE: 05/19/21:  rounded shoulders, forward head, flexed trunk , and weight shift right   LE ROM:   ROM P:passive  A:active Right 05/19/2021 Left 05/19/2021 Left 08/11/2021  Hip flexion       Hip extension   -15* standing -5* standing  Hip abduction       Hip adduction       Hip internal rotation       Hip external rotation       Knee flexion       Knee extension       Ankle dorsiflexion       Ankle plantarflexion       Ankle inversion       Ankle eversion        (Blank rows = not tested) MMT:   MMT Right 05/19/2021 Left 05/19/2021 Left 08/11/21  Hip flexion       Hip extension   3/5 Gross functional 4/5 Gross functional  Hip abduction   3/5 Gross functional 4/5 Gross functional  Hip adduction       Hip internal rotation       Hip external rotation       Knee flexion       Knee extension       Ankle dorsiflexion       Ankle plantarflexion       Ankle inversion       Ankle eversion       (Blank rows = not tested)   TRANSFERS: Sit to stand: 05/19/21:  SBA and requires UE assist with armrests & RW touch to stabilize Stand to sit: 05/19/21:  SBA and requires UE assist with armrests & RW stabilization   GAIT: 08/10/2021:  Pt neg curb with RW & TFA prosthesis modified independent. Pt neg ramp with RW & TFA prosthesis with verbal cues on hand position on RW to descend. Pt neg stairs with single rail & cane safely modified independent.   08/09/2021: pt amb 516' in 9 min with RW & TFA prosthesis modified independent. Post gait HR 67 SpO2 97%  Gait velocity: 1.10 ft/sec  He had one misstep that he self corrected.    Pattern: step to with decreased right step length, left hip hike.  Good prosthetic foot clearance, minimal abduction intermittently noted.   05/19/21:   Gait pattern: step to pattern, decreased step length- Right, decreased stance time- Left, decreased hip/knee flexion- Left, circumduction- Left, Left hip hike, antalgic, trunk flexed, abducted- Left, and poor foot clearance- Left  Distance walked: 73' Assistive device utilized: Environmental consultant - 2 wheeled and TFA prosthesis  excessive UE weight bearing Level of assistance: SBA   FUNCTIONAL TESTs:  08/11/2021  TUG with cane stand alone tip:  PT had pt walk one TUG path with minA for warm-up.  Std TUG 95.03 sec with minA  Cog TUG 146.38 sec with minA with pt describing how to build doghouse, able to talk entire time including turns.  Manual TUG 137.65 sec carrying open bottle of water with no spills.  08/09/2021  Berg Balance 34/56 & tasks of Berg with cane support 46/56   [facility] PT Assessment - 08/11/21 0001       Standardized Balance Assessment   Standardized Balance Assessment Berg Balance Test      Berg Balance Test   Sit to Stand Able to stand  independently using hands    Standing Unsupported Able to stand safely 2 minutes    Sitting with Back Unsupported but Feet Supported on Floor or Stool Able to sit safely and securely 2 minutes    Stand to Sit Controls descent by using hands    Transfers Able to transfer safely, definite need of hands    Standing Unsupported with Eyes Closed Able to stand 10 seconds safely    Standing Unsupported with Feet Together Able to place feet together independently and stand 1 minute safely    From Standing, Reach Forward with Outstretched Arm Can reach confidently >25 cm (10")    From Standing Position, Pick up Object from Floor Unable to pick up and needs supervision   with cane support =3   From Standing Position, Turn to Look Behind Over each Shoulder Turn sideways only but maintains balance   with cane support =3   Turn 360 Degrees Needs assistance while turning   with cane support =2   Standing Unsupported, Alternately Place Feet on Step/Stool Needs  assistance to keep from falling or unable to try   with cane support = 2   Standing  Unsupported, One Foot in [employer] help to step but can hold 15 seconds   with cane support =3   Standing on One Leg Tries to lift leg/unable to hold 3 seconds but remains standing independently   with cane support =4   Total Score 34    Berg comment: 7/11/2023Merrilee Jansky tasks with cane support 46/56   on 05/19/21 Merrilee Jansky was 17/56              Berg Balance Scale: 05/19/21:  17/56   [facility] PT Assessment - 05/19/21 0900                Standardized Balance Assessment    Standardized Balance Assessment Berg Balance Test          Berg Balance Test    Sit to Stand Needs minimal aid to stand or to stabilize     Standing Unsupported Able to stand 2 minutes with supervision     Sitting with Back Unsupported but Feet Supported on Floor or Stool Able to sit safely and securely 2 minutes     Stand to Sit Controls descent by using hands     Transfers Able to transfer safely, definite need of hands     Standing Unsupported with Eyes Closed Unable to keep eyes closed 3 seconds but stays steady     Standing Unsupported with Feet Together Needs help to attain position but able to stand for 30 seconds with feet together     From Standing, Reach Forward with Outstretched Arm Reaches forward but needs supervision     From Standing Position, Pick up Object from Floor Unable to try/needs assist to keep balance     From Standing Position, Turn to Look Behind Over each Shoulder Needs assist to keep from losing balance and falling     Turn 360 Degrees Needs assistance while turning     Standing Unsupported, Alternately Place Feet on Step/Stool Needs assistance to keep from falling or unable to try     Standing Unsupported, One Foot in [employer] balance while stepping or standing     Standing on One Leg Unable to try or needs assist to prevent fall     Total Score 17     Berg comment: BERG  < 36 high risk for falls (close to  100%) 46-51 moderate (>50%)   37-45 significant (>80%) 52-55 lower (> 25%)                   CURRENT PROSTHETIC WEAR ASSESSMENT: 05/19/21:  Patient is dependent with: skin check, residual limb care, care of non-amputated limb, prosthetic cleaning, ply sock cleaning, correct ply sock adjustment, proper wear schedule/adjustment, and proper weight-bearing schedule/adjustment Donning prosthesis: SBA Doffing prosthesis: Modified independence Prosthetic wear tolerance: 5-6 hours/day, 16 of 16 days since delivery.  Prosthetic weight bearing tolerance: 5 minutes with partial weight on prosthesis & intermittent support with RW Residual limb condition: 2 invaginated areas with one not clean, 1 small area on incision that may be suture working way out, dry skin, cylinderical shape, nonpitting edema,  Prosthetic description: silicon liner with velcro lanyard, ischial containment socket with flexible inner liner, SAFETY knee (single axis nonhydraulic), K2 flexible keel foot     TODAY'S TREATMENT:  08/11/2021 Prosthetic Training with Transfemoral prosthesis: See gait assessment & Functional TESTs above  PT reviewed recommendation for walking program - short walk (limited distances like one area to another in home) with goal increased frequency. He needs to try to  be consistent and slowly increase number of short walks over his awake hours.  Inconsistency of limited to no walks for 1-3 days, then high frequency may be a factor in his limb pain.  Long walk is his maximal tolerable distance. PT recommends 1-2 per day increasing distance over time.  Sudden increases or no long walks for days then tries to push distance may be a factor also in limb pain.  Medium walk in distance between short & long distances.  Goal is 4-6 medium walks per day.  May be walking in/out of building.  Again consistency in doing 4-6 per day should help limb pain. Pt verbalized understanding.  PT reviewed recommendation for shrinker  wear when out of prosthesis. Also watch salt intake as can cause fluid retention.  Most salt is foods or drinks not the salt shaker. Pt verbalized understanding.   08/09/2021 Pt verbalized proper prosthetic care. He is wearing prosthesis >90% of awake hours without skin issues except some "itching proximal limb" probably from sweat.  He does have intermittent days with limb pain limiting his mobility but seems to be less number of days and lower number rating pain.   Neuromuscular Re-ed:  See Objective for Berg Balance test without AD & with cane  Gait:  See objective for distance gait with RW.  08/04/2021 Prosthetic Training with Transfemoral Prosthesis: PT demo & verbal cues on weight shifting onto prosthesis for 2-3 sec 5 times prior to walking to seat his limb into the socket prior to walking esp when sitting >5 min.   Pt amb 100' X 3 without HHA with close supervision and was able to self correct 1 balance loss Pt ambulated 15' X 4 with 180* turn inside //bars without touching with cane with supervision.    Therex: Trunk ext: eccentric control reaching with control to chair bottom in front and concentric arising 10 reps with PT supervision. Nu step L7 X 6 min UE/LE for functional endurance Leg press machine DL 75# 2X15  Neuromuscular Re-ed:  stepping prosthesis forward with weight bearing for balance for 5 sec then step prosthesis back beside RLE without UE support, then stance on prosthesis stepping RLE forward with balance for 5 sec then step RLE back beside prosthesis with cane support. 10 reps each. PT demo & verbal cues prior and verbal, mirror & tactile / contact assist during activity.  Sidestepping with light UE support X 3 round trips in bars Turning 180* with cane CW & CCW - PT demo & verbal cues on technique. Pt turned both directions 3 times with supervision.      Access Code: [email or web address] URL: [email or web address] Date: 07/04/2021 Prepared by: Jamey Reas  Exercises - Seated Hamstring Stretch with Strap  - 2 x daily - 7 x weekly - 1 sets - 2-3 reps - 30 seconds hold - Modified Fritsch Stretch  - 2 x daily - 7 x weekly - 1 sets - 2 reps - 30 seconds hold - Supine Dynamic Modified Syme Quad and Hip Flexor Dynamic Stretch  - 2 x daily - 7 x weekly - 1 sets - 2 reps - 30 seconds hold - Standing posture with back to counter  - 2 x daily - 7 x weekly - 1 sets - 1 reps - 5-10 minutes hold - Upright Stance at Door Frame Single Arm  - 3-6 x daily - 7 x weekly - 1 sets - 2 reps - 2 deep breathes hold - Upright Stance at Door Frame with  Both Arms  - 3-6 x daily - 7 x weekly - 1 sets - 2 reps - 2 deep breathes hold - feet together eyes open on floor  - 1 x daily - 4-7 x weekly - 1 sets - 10 reps - 2 seconds hold - Feet Apart with Eyes Closed with Head Motions  - 1 x daily - 4-7 x weekly - 1 sets - 10 reps - 2 seconds hold - Wide stance on Foam Pad head movements  - 1 x daily - 4-7 x weekly - 1 sets - 10 reps - 2 seconds hold - Standing Quarter Turn with Counter Support  - 1 x daily - 4-7 x weekly - 1 sets - 10 reps - Side Stepping with Counter Support  - 1 x daily - 5-7 x weekly - 1 sets - 10 reps - Backward Walking with Counter Support  - 1 x daily - 5-7 x weekly - 1 sets - 10 reps    05/24/21 1151  PT Education  Education Details HEP at sink & residual limb pain vs phantom sensation vs phantom pain  Person(s) Educated Patient  Methods Explanation;Demonstration;Tactile cues;Verbal cues;Handout  Comprehension Verbalized understanding;Returned demonstration;Verbal cues required;Tactile cues required;Need further instruction   ASSESSMENT:   CLINICAL IMPRESSION: Patient has improved his mobility with Transfemoral Prosthesis but requires bilateral upper extremity support of rolling walker. He would benefit from additional skilled PT to further improve his function & safety with his prosthesis.    OBJECTIVE IMPAIRMENTS Abnormal gait, decreased  activity tolerance, decreased balance, decreased endurance, decreased knowledge of condition, decreased knowledge of use of DME, decreased mobility, decreased ROM, decreased strength, impaired flexibility, postural dysfunction, prosthetic dependency , and pain.    ACTIVITY LIMITATIONS community activity and occupation.    PERSONAL FACTORS Fitness, Time since onset of injury/illness/exacerbation, and 3+ comorbidities: see PMH  are also affecting patient's functional outcome.    REHAB POTENTIAL: Good   CLINICAL DECISION MAKING: Evolving/moderate complexity   EVALUATION COMPLEXITY: Moderate     GOALS: Goals reviewed with patient? Yes   SHORT TERM GOALS: Target date: 09/08/2021   Patient verbalizes how to decrease residual limb pain. Goal status: NEW 08/11/2021 2.  Patient tolerates prosthesis >90% awake hrs /day without skin issues or limb pain </= 2/10 >/= 75% of days after standing / walking. Goal status: 08/11/21 met for time & no skin issues but ongoing for limb pain.    3. Patient ambulates 100' with cane stand alone tip without hand hold assist & prosthesis with min guard. Goal status: ongoing 08/11/2021  LONG TERM GOALS: Target date: 08/08/238 (reset at re-certification)   Patient demonstrates & verbalized understanding of prosthetic care including issues to address limb pain to enable safe utilization of prosthesis. Baseline: SEE OBJECTIVE DATA Goal status: ongoing 08/09/2021   Patient tolerates prosthesis wear >90% of awake hours without skin or limb pain issues. Baseline: SEE OBJECTIVE DATA Goal status: partially met 08/09/2021   Merrilee Jansky Balance >/= 45/56 to indicate lower fall risk Baseline: SEE OBJECTIVE DATA Goal status: reset to higher level on 08/11/2021   Patient ambulates >500' with LRAD & prosthesis modified independent. Baseline: SEE OBJECTIVE DATA Goal status: met 08/09/2021 with RW   Patient negotiates ramps, curbs & stairs with single rail with LRAD & prosthesis  modified independent. Baseline: SEE OBJECTIVE DATA Goal status: met 08/11/2021 with RW  6.  Patient ambulates 100' with cane & prosthesis modified independent.  Baseline: SEE OBJECTIVE DATA Goal status: NEW 08/11/2021  7. Timed  Up & Go with cane stand alone tip modified independent:  Std TUG <60sec, Cog TUG <37mn, Manual TUG <2 min Baseline: SEE OBJECTIVE DATA Goal status: NEW 08/11/2021   PLAN: PT FREQUENCY: 2x/week   PT DURATION: 8 weeks   PLANNED INTERVENTIONS: Therapeutic exercises, Therapeutic activity, Neuromuscular re-education, Balance training, Gait training, Patient/Family education, Stair training, Vestibular training, Prosthetic training, and DME instructions, Physical Performance Testing   PLAN FOR NEXT SESSION:  work towards updated STGs, balance without UE support, household gait with cane stand alone tip.  RJamey Reas PT, DPT 08/11/2021, 12:38 PM

## 2021-08-16 ENCOUNTER — Ambulatory Visit (INDEPENDENT_AMBULATORY_CARE_PROVIDER_SITE_OTHER): Payer: Medicare Other | Admitting: Physical Therapy

## 2021-08-16 ENCOUNTER — Encounter: Payer: Self-pay | Admitting: Physical Therapy

## 2021-08-16 DIAGNOSIS — E1169 Type 2 diabetes mellitus with other specified complication: Secondary | ICD-10-CM | POA: Diagnosis not present

## 2021-08-16 DIAGNOSIS — I739 Peripheral vascular disease, unspecified: Secondary | ICD-10-CM | POA: Diagnosis not present

## 2021-08-16 DIAGNOSIS — R293 Abnormal posture: Secondary | ICD-10-CM

## 2021-08-16 DIAGNOSIS — M6281 Muscle weakness (generalized): Secondary | ICD-10-CM

## 2021-08-16 DIAGNOSIS — G546 Phantom limb syndrome with pain: Secondary | ICD-10-CM | POA: Diagnosis not present

## 2021-08-16 DIAGNOSIS — Z0000 Encounter for general adult medical examination without abnormal findings: Secondary | ICD-10-CM | POA: Diagnosis not present

## 2021-08-16 DIAGNOSIS — R2681 Unsteadiness on feet: Secondary | ICD-10-CM

## 2021-08-16 DIAGNOSIS — R2689 Other abnormalities of gait and mobility: Secondary | ICD-10-CM | POA: Diagnosis not present

## 2021-08-16 DIAGNOSIS — I10 Essential (primary) hypertension: Secondary | ICD-10-CM | POA: Diagnosis not present

## 2021-08-16 DIAGNOSIS — M79605 Pain in left leg: Secondary | ICD-10-CM

## 2021-08-16 DIAGNOSIS — G547 Phantom limb syndrome without pain: Secondary | ICD-10-CM | POA: Diagnosis not present

## 2021-08-16 NOTE — Therapy (Signed)
OUTPATIENT PHYSICAL THERAPY PROSTHETIC TREATMENT NOTE  Patient Name: Ryan [name] MRN: [id number] DOB:Mar 18, 1950, 71 y.o., male Today's Date: 08/16/2021  PCP: Iona Beard, MD REFERRING PROVIDER: Karoline Caldwell, PA-C       PT End of Session - 08/16/21 0927     Visit Number 26    Number of Visits 41    Date for PT Re-Evaluation 10/06/21    Authorization Type Medicare A/B & Generic commercial    Progress Note Due on Visit 35    PT Start Time 0930    PT Stop Time 1014    PT Time Calculation (min) 44 min    Equipment Utilized During Treatment Gait belt    Activity Tolerance Patient tolerated treatment well    Behavior During Therapy WFL for tasks assessed/performed                                Past Medical History:  Diagnosis Date   COVID 2022   Hypertension    PVD (peripheral vascular disease) ([place])    Past Surgical History:  Procedure Laterality Date   ABDOMINAL AORTAGRAM Left 12/13/2016   ABDOMINAL AORTOGRAM W/LOWER EXTREMITY/notes 12/13/2016   ABDOMINAL AORTOGRAM W/LOWER EXTREMITY N/A 11/28/2016   Procedure: ABDOMINAL AORTOGRAM W/LOWER EXTREMITY;  Surgeon: Adrian Prows, MD;  Location: [facility];  Service: Cardiovascular;  Laterality: N/A;  bilateral   ABDOMINAL AORTOGRAM W/LOWER EXTREMITY N/A 12/13/2016   Procedure: ABDOMINAL AORTOGRAM W/LOWER EXTREMITY;  Surgeon: Waynetta Sandy, MD;  Location: [facility];  Service: Cardiovascular;  Laterality: N/A;  unilater left leg   ABDOMINAL AORTOGRAM W/LOWER EXTREMITY N/A 01/09/2018   Procedure: ABDOMINAL AORTOGRAM W/LOWER EXTREMITY;  Surgeon: Marty Heck, MD;  Location: [facility];  Service: Cardiovascular;  Laterality: N/A;   ABDOMINAL AORTOGRAM W/LOWER EXTREMITY Left 09/22/2019   Procedure: ABDOMINAL AORTOGRAM W/LOWER EXTREMITY;  Surgeon: Waynetta Sandy, MD;  Location: [facility];  Service: Cardiovascular;  Laterality: Left;   ABDOMINAL  AORTOGRAM W/LOWER EXTREMITY Left 12/02/2020   Procedure: ABDOMINAL AORTOGRAM W/LOWER EXTREMITY;  Surgeon: Marty Heck, MD;  Location: [facility];  Service: Cardiovascular;  Laterality: Left;   ABDOMINAL AORTOGRAM W/LOWER EXTREMITY N/A 12/22/2020   Procedure: ABDOMINAL AORTOGRAM W/LOWER EXTREMITY;  Surgeon: Broadus John, MD;  Location: [facility];  Service: Cardiovascular;  Laterality: N/A;   AMPUTATION Left 02/11/2021   Procedure: LEFT ABOVE KNEE AMPUTATION;  Surgeon: Marty Heck, MD;  Location: [facility];  Service: Vascular;  Laterality: Left;   BYPASS GRAFT POPLITEAL TO TIBIAL Left 12/31/2020   Procedure: LEFT JUMP GRAFT REVISION BELOW KNEE POPLITEAL TO PERONEAL;  Surgeon: Marty Heck, MD;  Location: [facility];  Service: Vascular;  Laterality: Left;   EMBOLIZATION Left 12/03/2020   Procedure: EMBOLIZATION;  Surgeon: Cherre Robins, MD;  Location: [facility];  Service: Cardiovascular;  Laterality: Left;   FEMORAL-POPLITEAL BYPASS GRAFT Left 12/14/2016   Procedure: BYPASS GRAFT COMMON FEMORAL- BELOW KNEE POPLITEAL ARTERY with Bovine Patch to Below the knee poplitieal artery.;  Surgeon: Conrad Bigfoot, MD;  Location: [facility];  Service: Vascular;  Laterality: Left;   FEMORAL-POPLITEAL BYPASS GRAFT Left 01/28/2018   Procedure: BYPASS GRAFT FEMORAL-BELOW KNEE POPLITEAL ARTERY REDO with propaten vascular graft removable ring;  Surgeon: Marty Heck, MD;  Location: [facility];  Service: Vascular;  Laterality: Left;   FEMUR SURGERY Right 2009   pt. has a rod  in that leg   LOWER EXTREMITY ANGIOGRAPHY Left 11/28/2016   Procedure: Lower Extremity Angiography;  Surgeon: Adrian Prows, MD;  Location: [facility];  Service: Cardiovascular;  Laterality: Left;  lysis followup lower leg   PATCH ANGIOPLASTY Left 01/28/2018   Procedure: PATCH ANGIOPLASTY USING Rueben Bash BIOLOGIC PATCH;  Surgeon: Marty Heck, MD;  Location: [facility];  Service: Vascular;   Laterality: Left;   PATCH ANGIOPLASTY Left 06/16/2020   Procedure: PATCH ANGIOPLASTY OF LEFT BELOW KNEE Taylor ARTERY;  Surgeon: Marty Heck, MD;  Location: [facility];  Service: Vascular;  Laterality: Left;   PERIPHERAL VASCULAR BALLOON ANGIOPLASTY  11/28/2016   Procedure: PERIPHERAL VASCULAR BALLOON ANGIOPLASTY;  Surgeon: Adrian Prows, MD;  Location: [facility];  Service: Cardiovascular;;  SFA   PERIPHERAL VASCULAR BALLOON ANGIOPLASTY  12/03/2020   Procedure: PERIPHERAL VASCULAR BALLOON ANGIOPLASTY;  Surgeon: Cherre Robins, MD;  Location: [facility];  Service: Cardiovascular;;  TP Trunk and Peroneal   PERIPHERAL VASCULAR CATHETERIZATION N/A 08/11/2014   Procedure: Lower Extremity Angiography;  Surgeon: Adrian Prows, MD;  Location: [facility];  Service: Cardiovascular;  Laterality: N/A;   PERIPHERAL VASCULAR INTERVENTION Left 09/22/2019   Procedure: PERIPHERAL VASCULAR INTERVENTION;  Surgeon: Waynetta Sandy, MD;  Location: [facility];  Service: Cardiovascular;  Laterality: Left;   PERIPHERAL VASCULAR INTERVENTION Left 09/23/2019   Procedure: PERIPHERAL VASCULAR INTERVENTION;  Surgeon: Serafina Mitchell, MD;  Location: [facility];  Service: Cardiovascular;  Laterality: Left;   PERIPHERAL VASCULAR INTERVENTION Left 05/13/2020   Procedure: PERIPHERAL VASCULAR INTERVENTION;  Surgeon: Marty Heck, MD;  Location: [facility];  Service: Cardiovascular;  Laterality: Left;   PERIPHERAL VASCULAR INTERVENTION Left 12/02/2020   Procedure: PERIPHERAL VASCULAR INTERVENTION;  Surgeon: Marty Heck, MD;  Location: [facility];  Service: Cardiovascular;  Laterality: Left;   PERIPHERAL VASCULAR INTERVENTION  12/03/2020   Procedure: PERIPHERAL VASCULAR INTERVENTION;  Surgeon: Cherre Robins, MD;  Location: [facility];  Service: Cardiovascular;;   PERIPHERAL VASCULAR INTERVENTION Left 12/22/2020   Procedure: PERIPHERAL VASCULAR  INTERVENTION;  Surgeon: Broadus John, MD;  Location: [facility];  Service: Cardiovascular;  Laterality: Left;   PERIPHERAL VASCULAR THROMBECTOMY  11/28/2016   Procedure: PERIPHERAL VASCULAR THROMBECTOMY;  Surgeon: Adrian Prows, MD;  Location: [facility];  Service: Cardiovascular;;  Profunda   PERIPHERAL VASCULAR THROMBECTOMY  11/28/2016   Procedure: PERIPHERAL VASCULAR THROMBECTOMY;  Surgeon: Adrian Prows, MD;  Location: [facility];  Service: Cardiovascular;;   PERIPHERAL VASCULAR THROMBECTOMY N/A 05/12/2020   Procedure: PERIPHERAL VASCULAR THROMBECTOMY-Left Leg;  Surgeon: Cherre Robins, MD;  Location: [facility];  Service: Cardiovascular;  Laterality: N/A;   PERIPHERAL VASCULAR THROMBECTOMY N/A 05/13/2020   Procedure: PERIPHERAL VASCULAR THROMBECTOMY- LYSIS RECHECK;  Surgeon: Marty Heck, MD;  Location: [facility];  Service: Cardiovascular;  Laterality: N/A;   PERIPHERAL VASCULAR THROMBECTOMY N/A 12/02/2020   Procedure: PERIPHERAL VASCULAR THROMBECTOMY;  Surgeon: Marty Heck, MD;  Location: [facility];  Service: Cardiovascular;  Laterality: N/A;   PERIPHERAL VASCULAR THROMBECTOMY N/A 12/03/2020   Procedure: LYSIS RECHECK;  Surgeon: Cherre Robins, MD;  Location: [facility];  Service: Cardiovascular;  Laterality: N/A;   PULMONARY THROMBECTOMY N/A 09/23/2019   Procedure: LYSIS RECHECK;  Surgeon: Serafina Mitchell, MD;  Location: [facility];  Service: Cardiovascular;  Laterality: N/A;   THROMBECTOMY FEMORAL ARTERY Left  12/14/2016   Procedure: THROMBECTOMY PROFUNDA FEMORAL ARTERY;  Surgeon: Conrad Cornwall, MD;  Location: [facility];  Service: Vascular;  Laterality: Left;   THROMBECTOMY FEMORAL ARTERY Left 06/16/2020   Procedure: Redo exposure ot Left below knee popiteal artery;  Surgeon: Marty Heck, MD;  Location: [facility];  Service: Vascular;  Laterality: Left;   THROMBECTOMY OF BYPASS GRAFT FEMORAL- POPLITEAL ARTERY  Left 06/16/2020   Procedure: THROMBECTOMY OF BYPASS GRAFT FEMORAL-POPLITEAL ARTERY and TIBIAL ARTERY;  Surgeon: Marty Heck, MD;  Location: [place] [facility];  Service: Vascular;  Laterality: Left;   THROMBECTOMY OF BYPASS GRAFT FEMORAL- POPLITEAL ARTERY Left 12/31/2020   Procedure: THROMBECTOMY OF LEFT COMMON FEMORAL TO BELOW KNEE POPLITEAL ARTERY BYPASS GRAFT;  Surgeon: Marty Heck, MD;  Location: [place];  Service: Vascular;  Laterality: Left;   VEIN HARVEST Left 12/31/2020   Procedure: Left Greater Saphenous Vein Harvest;  Surgeon: Marty Heck, MD;  Location: [place];  Service: Vascular;  Laterality: Left;   Patient Active Problem List   Diagnosis Date Noted   Peripheral artery disease ([place]) 12/31/2020   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[place]) 05/12/2020   Thrombosis of femoro-popliteal bypass graft ([place]) 05/12/2020   Critical lower limb ischemia ([place]) 12/11/2016   Claudication in peripheral vascular disease ([place]) 11/28/2016   HTN (hypertension) 10/09/2014   Tobacco use 10/09/2014   PAD (peripheral artery disease) ([place])     REFERRING DIAG: V78.469 Left Transfemoral Amputation, I73.9 PAD  ONSET DATE: 05/03/2021 prosthesis delivery  THERAPY DIAG:  Unsteadiness on feet  Muscle weakness (generalized)  Abnormal posture  Other abnormalities of gait and mobility  Phantom limb syndrome with pain ([facility])  Pain in left leg  PERTINENT HISTORY: left TFA, HTN, PVD, Covid, ORIF left femur 2009  PRECAUTIONS: Fall  SUBJECTIVE:  His leg has not been hurting all weekend.  He is able to go out into community with RW.    PAIN:  Are you having pain?  Yes: NPRS scale: arrived to PT   0/10.    Pain location: distal residual limb Pain description: cramping when he wakes up, soreness walking & aches when sitting with prosthesis. Aggravating factors: walking with sudden increases in time Relieving factors: wearing prosthesis  OBJECTIVE:    POSTURE: 05/19/21:  rounded shoulders,  forward head, flexed trunk , and weight shift right   LE ROM:   ROM P:passive  A:active Right 05/19/2021 Left 05/19/2021 Left 08/11/2021  Hip flexion       Hip extension   -15* standing -5* standing  Hip abduction       Hip adduction       Hip internal rotation       Hip external rotation       Knee flexion       Knee extension       Ankle dorsiflexion       Ankle plantarflexion       Ankle inversion       Ankle eversion        (Blank rows = not tested) MMT:   MMT Right 05/19/2021 Left 05/19/2021 Left 08/11/21  Hip flexion       Hip extension   3/5 Gross functional 4/5 Gross functional  Hip abduction   3/5 Gross functional 4/5 Gross functional  Hip adduction       Hip internal rotation       Hip external rotation       Knee flexion       Knee  extension       Ankle dorsiflexion       Ankle plantarflexion       Ankle inversion       Ankle eversion       (Blank rows = not tested)   TRANSFERS: Sit to stand: 05/19/21:  SBA and requires UE assist with armrests & RW touch to stabilize Stand to sit: 05/19/21:  SBA and requires UE assist with armrests & RW stabilization   GAIT: 08/10/2021:  Pt neg curb with RW & TFA prosthesis modified independent. Pt neg ramp with RW & TFA prosthesis with verbal cues on hand position on RW to descend. Pt neg stairs with single rail & cane safely modified independent.   08/09/2021: pt amb 516' in 9 min with RW & TFA prosthesis modified independent. Post gait HR 67 SpO2 97%  Gait velocity: 1.10 ft/sec  He had one misstep that he self corrected.    Pattern: step to with decreased right step length, left hip hike.  Good prosthetic foot clearance, minimal abduction intermittently noted.   05/19/21:  Gait pattern: step to pattern, decreased step length- Right, decreased stance time- Left, decreased hip/knee flexion- Left, circumduction- Left, Left hip hike, antalgic, trunk flexed, abducted- Left, and poor foot clearance- Left  Distance walked:  40' Assistive device utilized: Environmental consultant - 2 wheeled and TFA prosthesis  excessive UE weight bearing Level of assistance: SBA   FUNCTIONAL TESTs:  08/11/2021  TUG with cane stand alone tip:  PT had pt walk one TUG path with minA for warm-up.  Std TUG 95.03 sec with minA  Cog TUG 146.38 sec with minA with pt describing how to build doghouse, able to talk entire time including turns.  Manual TUG 137.65 sec carrying open bottle of water with no spills.  08/09/2021  Berg Balance 34/56 & tasks of Berg with cane support 46/56      Berg Balance Scale: 05/19/21:  17/56   [facility] PT Assessment - 05/19/21 0900                Standardized Balance Assessment    Standardized Balance Assessment Berg Balance Test          Berg Balance Test    Sit to Stand Needs minimal aid to stand or to stabilize     Standing Unsupported Able to stand 2 minutes with supervision     Sitting with Back Unsupported but Feet Supported on Floor or Stool Able to sit safely and securely 2 minutes     Stand to Sit Controls descent by using hands     Transfers Able to transfer safely, definite need of hands     Standing Unsupported with Eyes Closed Unable to keep eyes closed 3 seconds but stays steady     Standing Unsupported with Feet Together Needs help to attain position but able to stand for 30 seconds with feet together     From Standing, Reach Forward with Outstretched Arm Reaches forward but needs supervision     From Standing Position, Pick up Object from Floor Unable to try/needs assist to keep balance     From Standing Position, Turn to Look Behind Over each Shoulder Needs assist to keep from losing balance and falling     Turn 360 Degrees Needs assistance while turning     Standing Unsupported, Alternately Place Feet on Step/Stool Needs assistance to keep from falling or unable to try     Standing Unsupported, One Foot in [employer] balance  while stepping or standing     Standing on One Leg Unable to try or needs  assist to prevent fall     Total Score 17     Berg comment: BERG  < 36 high risk for falls (close to 100%) 46-51 moderate (>50%)   37-45 significant (>80%) 52-55 lower (> 25%)                   CURRENT PROSTHETIC WEAR ASSESSMENT: 05/19/21:  Patient is dependent with: skin check, residual limb care, care of non-amputated limb, prosthetic cleaning, ply sock cleaning, correct ply sock adjustment, proper wear schedule/adjustment, and proper weight-bearing schedule/adjustment Donning prosthesis: SBA Doffing prosthesis: Modified independence Prosthetic wear tolerance: 5-6 hours/day, 16 of 16 days since delivery.  Prosthetic weight bearing tolerance: 5 minutes with partial weight on prosthesis & intermittent support with RW Residual limb condition: 2 invaginated areas with one not clean, 1 small area on incision that may be suture working way out, dry skin, cylinderical shape, nonpitting edema,  Prosthetic description: silicon liner with velcro lanyard, ischial containment socket with flexible inner liner, SAFETY knee (single axis nonhydraulic), K2 flexible keel foot    TODAY'S TREATMENT:  08/16/2021 Prosthetic Training with Transfemoral Prosthesis: Pt ambulated 20' with cane stand alone tip with turning 180* with minA /min guard.  PT demo & verbal cues on cane placement, wt shift over prosthesis in stance & upright posture. Walking around square with sidestepping & backwards with cane with minA & verbal cues on technique. Walking around obstacles 5' apart with turns right & left with cane with minA. Pt amb 25' carrying open bottle of water with cane with min guard. Pt amb 80' with cane naming foods A-Z with min guard. Working on gait with distraction.    08/11/2021 Prosthetic Training with Transfemoral prosthesis: See gait assessment & Functional TESTs above  PT reviewed recommendation for walking program - short walk (limited distances like one area to another in home) with goal increased  frequency. He needs to try to be consistent and slowly increase number of short walks over his awake hours.  Inconsistency of limited to no walks for 1-3 days, then high frequency may be a factor in his limb pain.  Long walk is his maximal tolerable distance. PT recommends 1-2 per day increasing distance over time.  Sudden increases or no long walks for days then tries to push distance may be a factor also in limb pain.  Medium walk in distance between short & long distances.  Goal is 4-6 medium walks per day.  May be walking in/out of building.  Again consistency in doing 4-6 per day should help limb pain. Pt verbalized understanding.  PT reviewed recommendation for shrinker wear when out of prosthesis. Also watch salt intake as can cause fluid retention.  Most salt is foods or drinks not the salt shaker. Pt verbalized understanding.   08/09/2021 Pt verbalized proper prosthetic care. He is wearing prosthesis >90% of awake hours without skin issues except some "itching proximal limb" probably from sweat.  He does have intermittent days with limb pain limiting his mobility but seems to be less number of days and lower number rating pain.   Neuromuscular Re-ed:  See Objective for Berg Balance test without AD & with cane  Gait:  See objective for distance gait with RW.     Access Code: [email or web address] URL: [email or web address] Date: 07/04/2021 Prepared by: Jamey Reas  Exercises - Seated Hamstring Stretch with Strap  - 2 x daily -  7 x weekly - 1 sets - 2-3 reps - 30 seconds hold - Modified Prouty Stretch  - 2 x daily - 7 x weekly - 1 sets - 2 reps - 30 seconds hold - Supine Dynamic Modified Svehla Quad and Hip Flexor Dynamic Stretch  - 2 x daily - 7 x weekly - 1 sets - 2 reps - 30 seconds hold - Standing posture with back to counter  - 2 x daily - 7 x weekly - 1 sets - 1 reps - 5-10 minutes hold - Upright Stance at Door Frame Single Arm  - 3-6 x daily - 7 x weekly - 1 sets - 2 reps - 2  deep breathes hold - Upright Stance at Door Frame with Both Arms  - 3-6 x daily - 7 x weekly - 1 sets - 2 reps - 2 deep breathes hold - feet together eyes open on floor  - 1 x daily - 4-7 x weekly - 1 sets - 10 reps - 2 seconds hold - Feet Apart with Eyes Closed with Head Motions  - 1 x daily - 4-7 x weekly - 1 sets - 10 reps - 2 seconds hold - Wide stance on Foam Pad head movements  - 1 x daily - 4-7 x weekly - 1 sets - 10 reps - 2 seconds hold - Standing Quarter Turn with Counter Support  - 1 x daily - 4-7 x weekly - 1 sets - 10 reps - Side Stepping with Counter Support  - 1 x daily - 5-7 x weekly - 1 sets - 10 reps - Backward Walking with Counter Support  - 1 x daily - 5-7 x weekly - 1 sets - 10 reps    05/24/21 1151  PT Education  Education Details HEP at sink & residual limb pain vs phantom sensation vs phantom pain  Person(s) Educated Patient  Methods Explanation;Demonstration;Tactile cues;Verbal cues;Handout  Comprehension Verbalized understanding;Returned demonstration;Verbal cues required;Tactile cues required;Need further instruction   ASSESSMENT:   CLINICAL IMPRESSION: PT worked on gait with prosthesis including distraction, carrying water and neg obstacles.  He improved with instruction & repetition.     OBJECTIVE IMPAIRMENTS Abnormal gait, decreased activity tolerance, decreased balance, decreased endurance, decreased knowledge of condition, decreased knowledge of use of DME, decreased mobility, decreased ROM, decreased strength, impaired flexibility, postural dysfunction, prosthetic dependency , and pain.    ACTIVITY LIMITATIONS community activity and occupation.    PERSONAL FACTORS Fitness, Time since onset of injury/illness/exacerbation, and 3+ comorbidities: see PMH  are also affecting patient's functional outcome.    REHAB POTENTIAL: Good   CLINICAL DECISION MAKING: Evolving/moderate complexity   EVALUATION COMPLEXITY: Moderate     GOALS: Goals reviewed with  patient? Yes   SHORT TERM GOALS: Target date: 09/08/2021   Patient verbalizes how to decrease residual limb pain. Goal status: NEW 08/11/2021 2.  Patient tolerates prosthesis >90% awake hrs /day without skin issues or limb pain </= 2/10 >/= 75% of days after standing / walking. Goal status: 08/11/21 met for time & no skin issues but ongoing for limb pain.    3. Patient ambulates 100' with cane stand alone tip without hand hold assist & prosthesis with min guard. Goal status: ongoing 08/11/2021  LONG TERM GOALS: Target date: 05/31/7780 (reset at re-certification)   Patient demonstrates & verbalized understanding of prosthetic care including issues to address limb pain to enable safe utilization of prosthesis. Baseline: SEE OBJECTIVE DATA Goal status: ongoing 08/09/2021   Patient tolerates prosthesis wear >  90% of awake hours without skin or limb pain issues. Baseline: SEE OBJECTIVE DATA Goal status: partially met 08/09/2021   Merrilee Jansky Balance >/= 45/56 to indicate lower fall risk Baseline: SEE OBJECTIVE DATA Goal status: reset to higher level on 08/11/2021   Patient ambulates >500' with LRAD & prosthesis modified independent. Baseline: SEE OBJECTIVE DATA Goal status: met 08/09/2021 with RW   Patient negotiates ramps, curbs & stairs with single rail with LRAD & prosthesis modified independent. Baseline: SEE OBJECTIVE DATA Goal status: met 08/11/2021 with RW  6.  Patient ambulates 100' with cane & prosthesis modified independent.  Baseline: SEE OBJECTIVE DATA Goal status: NEW 08/11/2021  7. Timed Up & Go with cane stand alone tip modified independent:  Std TUG <60sec, Cog TUG <37mn, Manual TUG <2 min Baseline: SEE OBJECTIVE DATA Goal status: NEW 08/11/2021   PLAN: PT FREQUENCY: 2x/week   PT DURATION: 8 weeks   PLANNED INTERVENTIONS: Therapeutic exercises, Therapeutic activity, Neuromuscular re-education, Balance training, Gait training, Patient/Family education, Stair training, Vestibular  training, Prosthetic training, and DME instructions, Physical Performance Testing   PLAN FOR NEXT SESSION:  continue work towards updated STGs, balance without UE support, household gait with cane stand alone tip including multi-tasking.  RJamey Reas PT, DPT 08/16/2021, 10:50 AM

## 2021-08-18 ENCOUNTER — Encounter: Payer: Self-pay | Admitting: Physical Therapy

## 2021-08-18 ENCOUNTER — Ambulatory Visit (INDEPENDENT_AMBULATORY_CARE_PROVIDER_SITE_OTHER): Payer: Medicare Other | Admitting: Physical Therapy

## 2021-08-18 DIAGNOSIS — G546 Phantom limb syndrome with pain: Secondary | ICD-10-CM | POA: Diagnosis not present

## 2021-08-18 DIAGNOSIS — M6281 Muscle weakness (generalized): Secondary | ICD-10-CM | POA: Diagnosis not present

## 2021-08-18 DIAGNOSIS — R2681 Unsteadiness on feet: Secondary | ICD-10-CM

## 2021-08-18 DIAGNOSIS — M79605 Pain in left leg: Secondary | ICD-10-CM | POA: Diagnosis not present

## 2021-08-18 DIAGNOSIS — R2689 Other abnormalities of gait and mobility: Secondary | ICD-10-CM

## 2021-08-18 DIAGNOSIS — R293 Abnormal posture: Secondary | ICD-10-CM

## 2021-08-18 NOTE — Therapy (Signed)
OUTPATIENT PHYSICAL THERAPY PROSTHETIC TREATMENT NOTE  Patient Name: Ryan [name] MRN: [id number] DOB:[birth year], 71 y.o., male Today's Date: 08/18/2021  PCP: Iona Beard, MD REFERRING PROVIDER: Karoline Caldwell, PA-C       PT End of Session - 08/18/21 1013     Visit Number 27    Number of Visits 41    Date for PT Re-Evaluation 10/06/21    Authorization Type Medicare A/B & Generic commercial    Progress Note Due on Visit 35    PT Start Time 3664    PT Stop Time 1059    PT Time Calculation (min) 44 min    Equipment Utilized During Treatment Gait belt    Activity Tolerance Patient tolerated treatment well    Behavior During Therapy WFL for tasks assessed/performed                                 Past Medical History:  Diagnosis Date   COVID 2022   Hypertension    PVD (peripheral vascular disease) ([place])    Past Surgical History:  Procedure Laterality Date   ABDOMINAL AORTAGRAM Left 12/13/2016   ABDOMINAL AORTOGRAM W/LOWER EXTREMITY/notes 12/13/2016   ABDOMINAL AORTOGRAM W/LOWER EXTREMITY N/A 11/28/2016   Procedure: ABDOMINAL AORTOGRAM W/LOWER EXTREMITY;  Surgeon: Adrian Prows, MD;  Location: [facility];  Service: Cardiovascular;  Laterality: N/A;  bilateral   ABDOMINAL AORTOGRAM W/LOWER EXTREMITY N/A 12/13/2016   Procedure: ABDOMINAL AORTOGRAM W/LOWER EXTREMITY;  Surgeon: Waynetta Sandy, MD;  Location: [facility];  Service: Cardiovascular;  Laterality: N/A;  unilater left leg   ABDOMINAL AORTOGRAM W/LOWER EXTREMITY N/A 01/09/2018   Procedure: ABDOMINAL AORTOGRAM W/LOWER EXTREMITY;  Surgeon: Marty Heck, MD;  Location: [facility];  Service: Cardiovascular;  Laterality: N/A;   ABDOMINAL AORTOGRAM W/LOWER EXTREMITY Left 09/22/2019   Procedure: ABDOMINAL AORTOGRAM W/LOWER EXTREMITY;  Surgeon: Waynetta Sandy, MD;  Location: [facility];  Service: Cardiovascular;  Laterality: Left;   ABDOMINAL  AORTOGRAM W/LOWER EXTREMITY Left 12/02/2020   Procedure: ABDOMINAL AORTOGRAM W/LOWER EXTREMITY;  Surgeon: Marty Heck, MD;  Location: [facility];  Service: Cardiovascular;  Laterality: Left;   ABDOMINAL AORTOGRAM W/LOWER EXTREMITY N/A 12/22/2020   Procedure: ABDOMINAL AORTOGRAM W/LOWER EXTREMITY;  Surgeon: Broadus John, MD;  Location: [facility];  Service: Cardiovascular;  Laterality: N/A;   AMPUTATION Left 02/11/2021   Procedure: LEFT ABOVE KNEE AMPUTATION;  Surgeon: Marty Heck, MD;  Location: [facility];  Service: Vascular;  Laterality: Left;   BYPASS GRAFT POPLITEAL TO TIBIAL Left 12/31/2020   Procedure: LEFT JUMP GRAFT REVISION BELOW KNEE POPLITEAL TO PERONEAL;  Surgeon: Marty Heck, MD;  Location: [facility];  Service: Vascular;  Laterality: Left;   EMBOLIZATION Left 12/03/2020   Procedure: EMBOLIZATION;  Surgeon: Cherre Robins, MD;  Location: [facility];  Service: Cardiovascular;  Laterality: Left;   FEMORAL-POPLITEAL BYPASS GRAFT Left 12/14/2016   Procedure: BYPASS GRAFT COMMON FEMORAL- BELOW KNEE POPLITEAL ARTERY with Bovine Patch to Below the knee poplitieal artery.;  Surgeon: Conrad Elgin, MD;  Location: [facility];  Service: Vascular;  Laterality: Left;   FEMORAL-POPLITEAL BYPASS GRAFT Left 01/28/2018   Procedure: BYPASS GRAFT FEMORAL-BELOW KNEE POPLITEAL ARTERY REDO with propaten vascular graft removable ring;  Surgeon: Marty Heck, MD;  Location: [place];  Service: Vascular;  Laterality: Left;   FEMUR SURGERY Right 2009   pt. has a  rod in that leg   LOWER EXTREMITY ANGIOGRAPHY Left 11/28/2016   Procedure: Lower Extremity Angiography;  Surgeon: Adrian Prows, MD;  Location: [facility];  Service: Cardiovascular;  Laterality: Left;  lysis followup lower leg   PATCH ANGIOPLASTY Left 01/28/2018   Procedure: PATCH ANGIOPLASTY USING Rueben Bash BIOLOGIC PATCH;  Surgeon: Marty Heck, MD;  Location: [place];  Service: Vascular;   Laterality: Left;   PATCH ANGIOPLASTY Left 06/16/2020   Procedure: PATCH ANGIOPLASTY OF LEFT BELOW KNEE [place] ARTERY;  Surgeon: Marty Heck, MD;  Location: [place];  Service: Vascular;  Laterality: Left;   PERIPHERAL VASCULAR BALLOON ANGIOPLASTY  11/28/2016   Procedure: PERIPHERAL VASCULAR BALLOON ANGIOPLASTY;  Surgeon: Adrian Prows, MD;  Location: [facility];  Service: Cardiovascular;;  SFA   PERIPHERAL VASCULAR BALLOON ANGIOPLASTY  12/03/2020   Procedure: PERIPHERAL VASCULAR BALLOON ANGIOPLASTY;  Surgeon: Cherre Robins, MD;  Location: [facility];  Service: Cardiovascular;;  TP Trunk and Peroneal   PERIPHERAL VASCULAR CATHETERIZATION N/A 08/11/2014   Procedure: Lower Extremity Angiography;  Surgeon: Adrian Prows, MD;  Location: [place];  Service: Cardiovascular;  Laterality: N/A;   PERIPHERAL VASCULAR INTERVENTION Left 09/22/2019   Procedure: PERIPHERAL VASCULAR INTERVENTION;  Surgeon: Waynetta Sandy, MD;  Location: [facility];  Service: Cardiovascular;  Laterality: Left;   PERIPHERAL VASCULAR INTERVENTION Left 09/23/2019   Procedure: PERIPHERAL VASCULAR INTERVENTION;  Surgeon: Serafina Mitchell, MD;  Location: [facility];  Service: Cardiovascular;  Laterality: Left;   PERIPHERAL VASCULAR INTERVENTION Left 05/13/2020   Procedure: PERIPHERAL VASCULAR INTERVENTION;  Surgeon: Marty Heck, MD;  Location: [facility];  Service: Cardiovascular;  Laterality: Left;   PERIPHERAL VASCULAR INTERVENTION Left 12/02/2020   Procedure: PERIPHERAL VASCULAR INTERVENTION;  Surgeon: Marty Heck, MD;  Location: [facility];  Service: Cardiovascular;  Laterality: Left;   PERIPHERAL VASCULAR INTERVENTION  12/03/2020   Procedure: PERIPHERAL VASCULAR INTERVENTION;  Surgeon: Cherre Robins, MD;  Location: [facility];  Service: Cardiovascular;;   PERIPHERAL VASCULAR INTERVENTION Left 12/22/2020   Procedure: PERIPHERAL VASCULAR  INTERVENTION;  Surgeon: Broadus John, MD;  Location: [facility];  Service: Cardiovascular;  Laterality: Left;   PERIPHERAL VASCULAR THROMBECTOMY  11/28/2016   Procedure: PERIPHERAL VASCULAR THROMBECTOMY;  Surgeon: Adrian Prows, MD;  Location: [facility];  Service: Cardiovascular;;  Profunda   PERIPHERAL VASCULAR THROMBECTOMY  11/28/2016   Procedure: PERIPHERAL VASCULAR THROMBECTOMY;  Surgeon: Adrian Prows, MD;  Location: [facility];  Service: Cardiovascular;;   PERIPHERAL VASCULAR THROMBECTOMY N/A 05/12/2020   Procedure: PERIPHERAL VASCULAR THROMBECTOMY-Left Leg;  Surgeon: Cherre Robins, MD;  Location: [facility];  Service: Cardiovascular;  Laterality: N/A;   PERIPHERAL VASCULAR THROMBECTOMY N/A 05/13/2020   Procedure: PERIPHERAL VASCULAR THROMBECTOMY- LYSIS RECHECK;  Surgeon: Marty Heck, MD;  Location: [facility];  Service: Cardiovascular;  Laterality: N/A;   PERIPHERAL VASCULAR THROMBECTOMY N/A 12/02/2020   Procedure: PERIPHERAL VASCULAR THROMBECTOMY;  Surgeon: Marty Heck, MD;  Location: [facility];  Service: Cardiovascular;  Laterality: N/A;   PERIPHERAL VASCULAR THROMBECTOMY N/A 12/03/2020   Procedure: LYSIS RECHECK;  Surgeon: Cherre Robins, MD;  Location: [facility];  Service: Cardiovascular;  Laterality: N/A;   PULMONARY THROMBECTOMY N/A 09/23/2019   Procedure: LYSIS RECHECK;  Surgeon: Serafina Mitchell, MD;  Location: [facility];  Service: Cardiovascular;  Laterality: N/A;   THROMBECTOMY FEMORAL ARTERY  Left 12/14/2016   Procedure: THROMBECTOMY PROFUNDA FEMORAL ARTERY;  Surgeon: Conrad Kuna, MD;  Location: [place];  Service: Vascular;  Laterality: Left;   THROMBECTOMY FEMORAL ARTERY Left 06/16/2020   Procedure: Redo exposure ot Left below knee popiteal artery;  Surgeon: Marty Heck, MD;  Location: [facility] OR;  Service: Vascular;  Laterality: Left;   THROMBECTOMY OF BYPASS GRAFT FEMORAL- POPLITEAL ARTERY  Left 06/16/2020   Procedure: THROMBECTOMY OF BYPASS GRAFT FEMORAL-POPLITEAL ARTERY and TIBIAL ARTERY;  Surgeon: Marty Heck, MD;  Location: [place];  Service: Vascular;  Laterality: Left;   THROMBECTOMY OF BYPASS GRAFT FEMORAL- POPLITEAL ARTERY Left 12/31/2020   Procedure: THROMBECTOMY OF LEFT COMMON FEMORAL TO BELOW KNEE POPLITEAL ARTERY BYPASS GRAFT;  Surgeon: Marty Heck, MD;  Location: [place];  Service: Vascular;  Laterality: Left;   VEIN HARVEST Left 12/31/2020   Procedure: Left Greater Saphenous Vein Harvest;  Surgeon: Marty Heck, MD;  Location: [place];  Service: Vascular;  Laterality: Left;   Patient Active Problem List   Diagnosis Date Noted   Peripheral artery disease ([place]) 12/31/2020   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[place]) 05/12/2020   Thrombosis of femoro-popliteal bypass graft ([place]) 05/12/2020   Critical lower limb ischemia ([place]) 12/11/2016   Claudication in peripheral vascular disease ([place]) 11/28/2016   HTN (hypertension) 10/09/2014   Tobacco use 10/09/2014   PAD (peripheral artery disease) ([place])     REFERRING DIAG: 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  No falls. He wears prosthesis all awake hours except showering.    PAIN:  Are you having pain?  Yes: NPRS scale: arrived to PT  0/10.    Pain location: distal residual limb Pain description: cramping when he wakes up, soreness walking & aches when sitting with prosthesis. Aggravating factors: walking with sudden increases in time Relieving factors: wearing prosthesis  OBJECTIVE:    POSTURE: 05/19/21:   rounded shoulders, forward head, flexed trunk , and weight shift right   LE ROM:   ROM P:passive  A:active Right 05/19/2021 Left 05/19/2021 Left 08/11/2021  Hip flexion       Hip extension   -15* standing -5* standing  Hip abduction       Hip adduction       Hip internal rotation       Hip external rotation       Knee flexion       Knee extension       Ankle dorsiflexion       Ankle plantarflexion       Ankle inversion       Ankle eversion        (Blank rows = not tested) MMT:   MMT Right 05/19/2021 Left 05/19/2021 Left 08/11/21  Hip flexion       Hip extension   3/5 Gross functional 4/5 Gross functional  Hip abduction   3/5 Gross functional 4/5 Gross functional  Hip adduction       Hip internal rotation       Hip external rotation       Knee flexion  Knee extension       Ankle dorsiflexion       Ankle plantarflexion       Ankle inversion       Ankle eversion       (Blank rows = not tested)   TRANSFERS: Sit to stand: 05/19/21:  SBA and requires UE assist with armrests & RW touch to stabilize Stand to sit: 05/19/21:  SBA and requires UE assist with armrests & RW stabilization   GAIT: 08/10/2021:  Pt neg curb with RW & TFA prosthesis modified independent. Pt neg ramp with RW & TFA prosthesis with verbal cues on hand position on RW to descend. Pt neg stairs with single rail & cane safely modified independent.   08/09/2021: pt amb 516' in 9 min with RW & TFA prosthesis modified independent. Post gait HR 67 SpO2 97%  Gait velocity: 1.10 ft/sec  He had one misstep that he self corrected.    Pattern: step to with decreased right step length, left hip hike.  Good prosthetic foot clearance, minimal abduction intermittently noted.   05/19/21:  Gait pattern: step to pattern, decreased step length- Right, decreased stance time- Left, decreased hip/knee flexion- Left, circumduction- Left, Left hip hike, antalgic, trunk flexed, abducted- Left, and poor foot clearance- Left   Distance walked: 61' Assistive device utilized: Environmental consultant - 2 wheeled and TFA prosthesis  excessive UE weight bearing Level of assistance: SBA   FUNCTIONAL TESTs:  08/11/2021  TUG with cane stand alone tip:  PT had pt walk one TUG path with minA for warm-up.  Std TUG 95.03 sec with minA  Cog TUG 146.38 sec with minA with pt describing how to build doghouse, able to talk entire time including turns.  Manual TUG 137.65 sec carrying open bottle of water with no spills.  08/09/2021  Berg Balance 34/56 & tasks of Berg with cane support 46/56      Berg Balance Scale: 05/19/21:  17/56   [facility] PT Assessment - 05/19/21 0900                Standardized Balance Assessment    Standardized Balance Assessment Berg Balance Test          Berg Balance Test    Sit to Stand Needs minimal aid to stand or to stabilize     Standing Unsupported Able to stand 2 minutes with supervision     Sitting with Back Unsupported but Feet Supported on Floor or Stool Able to sit safely and securely 2 minutes     Stand to Sit Controls descent by using hands     Transfers Able to transfer safely, definite need of hands     Standing Unsupported with Eyes Closed Unable to keep eyes closed 3 seconds but stays steady     Standing Unsupported with Feet Together Needs help to attain position but able to stand for 30 seconds with feet together     From Standing, Reach Forward with Outstretched Arm Reaches forward but needs supervision     From Standing Position, Pick up Object from Floor Unable to try/needs assist to keep balance     From Standing Position, Turn to Look Behind Over each Shoulder Needs assist to keep from losing balance and falling     Turn 360 Degrees Needs assistance while turning     Standing Unsupported, Alternately Place Feet on Step/Stool Needs assistance to keep from falling or unable to try     Standing Unsupported, One Foot in [employer]  balance while stepping or standing     Standing on One Leg  Unable to try or needs assist to prevent fall     Total Score 17     Berg comment: BERG  < 36 high risk for falls (close to 100%) 46-51 moderate (>50%)   37-45 significant (>80%) 52-55 lower (> 25%)                   CURRENT PROSTHETIC WEAR ASSESSMENT: 05/19/21:  Patient is dependent with: skin check, residual limb care, care of non-amputated limb, prosthetic cleaning, ply sock cleaning, correct ply sock adjustment, proper wear schedule/adjustment, and proper weight-bearing schedule/adjustment Donning prosthesis: SBA Doffing prosthesis: Modified independence Prosthetic wear tolerance: 5-6 hours/day, 16 of 16 days since delivery.  Prosthetic weight bearing tolerance: 5 minutes with partial weight on prosthesis & intermittent support with RW Residual limb condition: 2 invaginated areas with one not clean, 1 small area on incision that may be suture working way out, dry skin, cylinderical shape, nonpitting edema,  Prosthetic description: silicon liner with velcro lanyard, ischial containment socket with flexible inner liner, SAFETY knee (single axis nonhydraulic), K2 flexible keel foot    TODAY'S TREATMENT:  08/18/2021 Prosthetic Training with Transfemoral Prosthesis: Pt ambulated 25' X 2 with cane stand alone tip with turning 180* around obstacle 1st rep to right & 2nd rep to left with supervision.  PT verbal cues on pelvic orientation esp in turns, wt shift over prosthesis in stance & upright posture. Walking around square with sidestepping & backwards with cane with CGA & verbal cues on technique. Walking around obstacles 5' apart with turns right & left with cane with supervision.  Pt able to pick up 6" cone from floor with supervision & verbal cues. Pt amb 25' carrying open bottle of water with cane with supervision. Pt amb 27' with cane naming automotive items A-Z with supervision. Working on gait with distraction.   08/16/2021 Prosthetic Training with Transfemoral Prosthesis: Pt  ambulated 20' with cane stand alone tip with turning 180* with minA /min guard.  PT demo & verbal cues on cane placement, wt shift over prosthesis in stance & upright posture. Walking around square with sidestepping & backwards with cane with minA & verbal cues on technique. Walking around obstacles 5' apart with turns right & left with cane with minA. Pt amb 25' carrying open bottle of water with cane with min guard. Pt amb 80' with cane naming foods A-Z with min guard. Working on gait with distraction.    08/11/2021 Prosthetic Training with Transfemoral prosthesis: See gait assessment & Functional TESTs above  PT reviewed recommendation for walking program - short walk (limited distances like one area to another in home) with goal increased frequency. He needs to try to be consistent and slowly increase number of short walks over his awake hours.  Inconsistency of limited to no walks for 1-3 days, then high frequency may be a factor in his limb pain.  Long walk is his maximal tolerable distance. PT recommends 1-2 per day increasing distance over time.  Sudden increases or no long walks for days then tries to push distance may be a factor also in limb pain.  Medium walk in distance between short & long distances.  Goal is 4-6 medium walks per day.  May be walking in/out of building.  Again consistency in doing 4-6 per day should help limb pain. Pt verbalized understanding.  PT reviewed recommendation for shrinker wear when out of prosthesis. Also watch  salt intake as can cause fluid retention.  Most salt is foods or drinks not the salt shaker. Pt verbalized understanding.     Access Code: [email or web address] URL: [email or web address] Date: 07/04/2021 Prepared by: Jamey Reas  Exercises - Seated Hamstring Stretch with Strap  - 2 x daily - 7 x weekly - 1 sets - 2-3 reps - 30 seconds hold - Modified Cardell Stretch  - 2 x daily - 7 x weekly - 1 sets - 2 reps - 30 seconds hold - Supine  Dynamic Modified [place]bee Quad and Hip Flexor Dynamic Stretch  - 2 x daily - 7 x weekly - 1 sets - 2 reps - 30 seconds hold - Standing posture with back to counter  - 2 x daily - 7 x weekly - 1 sets - 1 reps - 5-10 minutes hold - Upright Stance at Door Frame Single Arm  - 3-6 x daily - 7 x weekly - 1 sets - 2 reps - 2 deep breathes hold - Upright Stance at Door Frame with Both Arms  - 3-6 x daily - 7 x weekly - 1 sets - 2 reps - 2 deep breathes hold - feet together eyes open on floor  - 1 x daily - 4-7 x weekly - 1 sets - 10 reps - 2 seconds hold - Feet Apart with Eyes Closed with Head Motions  - 1 x daily - 4-7 x weekly - 1 sets - 10 reps - 2 seconds hold - Wide stance on Foam Pad head movements  - 1 x daily - 4-7 x weekly - 1 sets - 10 reps - 2 seconds hold - Standing Quarter Turn with Counter Support  - 1 x daily - 4-7 x weekly - 1 sets - 10 reps - Side Stepping with Counter Support  - 1 x daily - 5-7 x weekly - 1 sets - 10 reps - Backward Walking with Counter Support  - 1 x daily - 5-7 x weekly - 1 sets - 10 reps    05/24/21 1151  PT Education  Education Details HEP at sink & residual limb pain vs phantom sensation vs phantom pain  Person(s) Educated Patient  Methods Explanation;Demonstration;Tactile cues;Verbal cues;Handout  Comprehension Verbalized understanding;Returned demonstration;Verbal cues required;Tactile cues required;Need further instruction   ASSESSMENT:   CLINICAL IMPRESSION: Pt required less assistance today for mobility tasks with cane & TFA prosthesis.  He was able to self recover 2 minor balance losses with misstep without PT assistance.  He is on target to meet STGs by target date.    OBJECTIVE IMPAIRMENTS Abnormal gait, decreased activity tolerance, decreased balance, decreased endurance, decreased knowledge of condition, decreased knowledge of use of DME, decreased mobility, decreased ROM, decreased strength, impaired flexibility, postural dysfunction, prosthetic  dependency , and pain.    ACTIVITY LIMITATIONS community activity and occupation.    PERSONAL FACTORS Fitness, Time since onset of injury/illness/exacerbation, and 3+ comorbidities: see PMH  are also affecting patient's functional outcome.    REHAB POTENTIAL: Good   CLINICAL DECISION MAKING: Evolving/moderate complexity   EVALUATION COMPLEXITY: Moderate     GOALS: Goals reviewed with patient? Yes   SHORT TERM GOALS: Target date: 09/08/2021   Patient verbalizes how to decrease residual limb pain. Goal status: NEW 08/11/2021 2.  Patient tolerates prosthesis >90% awake hrs /day without skin issues or limb pain </= 2/10 >/= 75% of days after standing / walking. Goal status: 08/11/21 met for time & no skin issues but ongoing for limb pain.  3. Patient ambulates 100' with cane stand alone tip without hand hold assist & prosthesis with min guard. Goal status: ongoing 08/11/2021  LONG TERM GOALS: Target date: 06/05/5049 (reset at re-certification)   Patient demonstrates & verbalized understanding of prosthetic care including issues to address limb pain to enable safe utilization of prosthesis. Baseline: SEE OBJECTIVE DATA Goal status: ongoing 08/09/2021   Patient tolerates prosthesis wear >90% of awake hours without skin or limb pain issues. Baseline: SEE OBJECTIVE DATA Goal status: partially met 08/09/2021   Merrilee Jansky Balance >/= 45/56 to indicate lower fall risk Baseline: SEE OBJECTIVE DATA Goal status: reset to higher level on 08/11/2021   Patient ambulates >500' with LRAD & prosthesis modified independent. Baseline: SEE OBJECTIVE DATA Goal status: met 08/09/2021 with RW   Patient negotiates ramps, curbs & stairs with single rail with LRAD & prosthesis modified independent. Baseline: SEE OBJECTIVE DATA Goal status: met 08/11/2021 with RW  6.  Patient ambulates 100' with cane & prosthesis modified independent.  Baseline: SEE OBJECTIVE DATA Goal status: NEW 08/11/2021  7. Timed Up & Go  with cane stand alone tip modified independent:  Std TUG <60sec, Cog TUG <14mn, Manual TUG <2 min Baseline: SEE OBJECTIVE DATA Goal status: NEW 08/11/2021   PLAN: PT FREQUENCY: 2x/week   PT DURATION: 8 weeks   PLANNED INTERVENTIONS: Therapeutic exercises, Therapeutic activity, Neuromuscular re-education, Balance training, Gait training, Patient/Family education, Stair training, Vestibular training, Prosthetic training, and DME instructions, Physical Performance Testing   PLAN FOR NEXT SESSION:  work towards updated STGs, balance without UE support, household gait with cane stand alone tip including multi-tasking.  RJamey Reas PT, DPT 08/18/2021, 11:02 AM

## 2021-08-22 ENCOUNTER — Ambulatory Visit (INDEPENDENT_AMBULATORY_CARE_PROVIDER_SITE_OTHER): Payer: Medicare Other | Admitting: Physical Therapy

## 2021-08-22 ENCOUNTER — Encounter: Payer: Self-pay | Admitting: Physical Therapy

## 2021-08-22 DIAGNOSIS — R2689 Other abnormalities of gait and mobility: Secondary | ICD-10-CM | POA: Diagnosis not present

## 2021-08-22 DIAGNOSIS — R2681 Unsteadiness on feet: Secondary | ICD-10-CM

## 2021-08-22 DIAGNOSIS — M79605 Pain in left leg: Secondary | ICD-10-CM | POA: Diagnosis not present

## 2021-08-22 DIAGNOSIS — R293 Abnormal posture: Secondary | ICD-10-CM | POA: Diagnosis not present

## 2021-08-22 DIAGNOSIS — M6281 Muscle weakness (generalized): Secondary | ICD-10-CM | POA: Diagnosis not present

## 2021-08-22 DIAGNOSIS — G546 Phantom limb syndrome with pain: Secondary | ICD-10-CM

## 2021-08-22 NOTE — Therapy (Signed)
OUTPATIENT PHYSICAL THERAPY PROSTHETIC TREATMENT NOTE  Patient Name: Ryan [name] MRN: [id number] DOB:1950/09/27, 71 y.o., male Today's Date: 08/22/2021  PCP: Iona Beard, MD REFERRING PROVIDER: Karoline Caldwell, PA-C  End of Session:   PT End of Session - 08/22/21 0846     Visit Number 28    Number of Visits 41    Date for PT Re-Evaluation 10/06/21    Authorization Type Medicare A/B & Generic commercial    Progress Note Due on Visit 35    PT Start Time 0846    PT Stop Time 0931    PT Time Calculation (min) 45 min    Equipment Utilized During Treatment Gait belt    Activity Tolerance Patient tolerated treatment well    Behavior During Therapy [facility] for tasks assessed/performed             Past Medical History:  Diagnosis Date   COVID 2022   Hypertension    PVD (peripheral vascular disease) ([place])    Past Surgical History:  Procedure Laterality Date   ABDOMINAL AORTAGRAM Left 12/13/2016   ABDOMINAL AORTOGRAM W/LOWER EXTREMITY/notes 12/13/2016   ABDOMINAL AORTOGRAM W/LOWER EXTREMITY N/A 11/28/2016   Procedure: ABDOMINAL AORTOGRAM W/LOWER EXTREMITY;  Surgeon: Adrian Prows, MD;  Location: [place] [facility];  Service: Cardiovascular;  Laterality: N/A;  bilateral   ABDOMINAL AORTOGRAM W/LOWER EXTREMITY N/A 12/13/2016   Procedure: ABDOMINAL AORTOGRAM W/LOWER EXTREMITY;  Surgeon: Waynetta Sandy, MD;  Location: [place] [facility];  Service: Cardiovascular;  Laterality: N/A;  unilater left leg   ABDOMINAL AORTOGRAM W/LOWER EXTREMITY N/A 01/09/2018   Procedure: ABDOMINAL AORTOGRAM W/LOWER EXTREMITY;  Surgeon: Marty Heck, MD;  Location: [place] [facility];  Service: Cardiovascular;  Laterality: N/A;   ABDOMINAL AORTOGRAM W/LOWER EXTREMITY Left 09/22/2019   Procedure: ABDOMINAL AORTOGRAM W/LOWER EXTREMITY;  Surgeon: Waynetta Sandy, MD;  Location: [place] [facility];  Service: Cardiovascular;  Laterality: Left;   ABDOMINAL AORTOGRAM W/LOWER EXTREMITY Left  12/02/2020   Procedure: ABDOMINAL AORTOGRAM W/LOWER EXTREMITY;  Surgeon: Marty Heck, MD;  Location: [place] [facility];  Service: Cardiovascular;  Laterality: Left;   ABDOMINAL AORTOGRAM W/LOWER EXTREMITY N/A 12/22/2020   Procedure: ABDOMINAL AORTOGRAM W/LOWER EXTREMITY;  Surgeon: Broadus John, MD;  Location: [place] [facility];  Service: Cardiovascular;  Laterality: N/A;   AMPUTATION Left 02/11/2021   Procedure: LEFT ABOVE KNEE AMPUTATION;  Surgeon: Marty Heck, MD;  Location: [place];  Service: Vascular;  Laterality: Left;   BYPASS GRAFT POPLITEAL TO TIBIAL Left 12/31/2020   Procedure: LEFT JUMP GRAFT REVISION BELOW KNEE POPLITEAL TO PERONEAL;  Surgeon: Marty Heck, MD;  Location: [place];  Service: Vascular;  Laterality: Left;   EMBOLIZATION Left 12/03/2020   Procedure: EMBOLIZATION;  Surgeon: Cherre Robins, MD;  Location: [place] [facility];  Service: Cardiovascular;  Laterality: Left;   FEMORAL-POPLITEAL BYPASS GRAFT Left 12/14/2016   Procedure: BYPASS GRAFT COMMON FEMORAL- BELOW KNEE POPLITEAL ARTERY with Bovine Patch to Below the knee poplitieal artery.;  Surgeon: Conrad [place], MD;  Location: [place];  Service: Vascular;  Laterality: Left;   FEMORAL-POPLITEAL BYPASS GRAFT Left 01/28/2018   Procedure: BYPASS GRAFT FEMORAL-BELOW KNEE POPLITEAL ARTERY REDO with propaten vascular graft removable ring;  Surgeon: Marty Heck, MD;  Location: [place];  Service: Vascular;  Laterality: Left;   FEMUR SURGERY Right 2009   pt. has a rod in that leg   LOWER EXTREMITY ANGIOGRAPHY Left 11/28/2016   Procedure: Lower Extremity Angiography;  Surgeon: Einar Gip,  Ulice Dash, MD;  Location: [place] [facility];  Service: Cardiovascular;  Laterality: Left;  lysis followup lower leg   PATCH ANGIOPLASTY Left 01/28/2018   Procedure: PATCH ANGIOPLASTY USING Rueben Bash BIOLOGIC PATCH;  Surgeon: Marty Heck, MD;  Location: [place];  Service: Vascular;  Laterality: Left;   PATCH ANGIOPLASTY  Left 06/16/2020   Procedure: PATCH ANGIOPLASTY OF LEFT BELOW KNEE [place] ARTERY;  Surgeon: Marty Heck, MD;  Location: [place];  Service: Vascular;  Laterality: Left;   PERIPHERAL VASCULAR BALLOON ANGIOPLASTY  11/28/2016   Procedure: PERIPHERAL VASCULAR BALLOON ANGIOPLASTY;  Surgeon: Adrian Prows, MD;  Location: [place] [facility];  Service: Cardiovascular;;  SFA   PERIPHERAL VASCULAR BALLOON ANGIOPLASTY  12/03/2020   Procedure: PERIPHERAL VASCULAR BALLOON ANGIOPLASTY;  Surgeon: Cherre Robins, MD;  Location: [place] [facility];  Service: Cardiovascular;;  TP Trunk and Peroneal   PERIPHERAL VASCULAR CATHETERIZATION N/A 08/11/2014   Procedure: Lower Extremity Angiography;  Surgeon: Adrian Prows, MD;  Location: [place] [facility];  Service: Cardiovascular;  Laterality: N/A;   PERIPHERAL VASCULAR INTERVENTION Left 09/22/2019   Procedure: PERIPHERAL VASCULAR INTERVENTION;  Surgeon: Waynetta Sandy, MD;  Location: [place] [facility];  Service: Cardiovascular;  Laterality: Left;   PERIPHERAL VASCULAR INTERVENTION Left 09/23/2019   Procedure: PERIPHERAL VASCULAR INTERVENTION;  Surgeon: Serafina Mitchell, MD;  Location: [place] [facility];  Service: Cardiovascular;  Laterality: Left;   PERIPHERAL VASCULAR INTERVENTION Left 05/13/2020   Procedure: PERIPHERAL VASCULAR INTERVENTION;  Surgeon: Marty Heck, MD;  Location: [place] [facility];  Service: Cardiovascular;  Laterality: Left;   PERIPHERAL VASCULAR INTERVENTION Left 12/02/2020   Procedure: PERIPHERAL VASCULAR INTERVENTION;  Surgeon: Marty Heck, MD;  Location: [place] [facility];  Service: Cardiovascular;  Laterality: Left;   PERIPHERAL VASCULAR INTERVENTION  12/03/2020   Procedure: PERIPHERAL VASCULAR INTERVENTION;  Surgeon: Cherre Robins, MD;  Location: [place] [facility];  Service: Cardiovascular;;   PERIPHERAL VASCULAR INTERVENTION Left 12/22/2020   Procedure: PERIPHERAL VASCULAR INTERVENTION;  Surgeon: Broadus John, MD;  Location: [place] [facility];  Service: Cardiovascular;  Laterality: Left;   PERIPHERAL VASCULAR THROMBECTOMY  11/28/2016   Procedure: PERIPHERAL VASCULAR THROMBECTOMY;  Surgeon: Adrian Prows, MD;  Location: [place] [facility];  Service: Cardiovascular;;  Profunda   PERIPHERAL VASCULAR THROMBECTOMY  11/28/2016   Procedure: PERIPHERAL VASCULAR THROMBECTOMY;  Surgeon: Adrian Prows, MD;  Location: [place] [facility];  Service: Cardiovascular;;   PERIPHERAL VASCULAR THROMBECTOMY N/A 05/12/2020   Procedure: PERIPHERAL VASCULAR THROMBECTOMY-Left Leg;  Surgeon: Cherre Robins, MD;  Location: [place] [facility];  Service: Cardiovascular;  Laterality: N/A;   PERIPHERAL VASCULAR THROMBECTOMY N/A 05/13/2020   Procedure: PERIPHERAL VASCULAR THROMBECTOMY- LYSIS RECHECK;  Surgeon: Marty Heck, MD;  Location: [place] [facility];  Service: Cardiovascular;  Laterality: N/A;   PERIPHERAL VASCULAR THROMBECTOMY N/A 12/02/2020   Procedure: PERIPHERAL VASCULAR THROMBECTOMY;  Surgeon: Marty Heck, MD;  Location: [place] [facility];  Service: Cardiovascular;  Laterality: N/A;   PERIPHERAL VASCULAR THROMBECTOMY N/A 12/03/2020   Procedure: LYSIS RECHECK;  Surgeon: Cherre Robins, MD;  Location: [place] [facility];  Service: Cardiovascular;  Laterality: N/A;   PULMONARY THROMBECTOMY N/A 09/23/2019   Procedure: LYSIS RECHECK;  Surgeon: Serafina Mitchell, MD;  Location: [place] [facility];  Service: Cardiovascular;  Laterality: N/A;   THROMBECTOMY FEMORAL ARTERY Left 12/14/2016   Procedure: THROMBECTOMY PROFUNDA FEMORAL ARTERY;  Surgeon: Conrad [place], MD;  Location: [place];  Service: Vascular;  Laterality: Left;   THROMBECTOMY FEMORAL ARTERY Left 06/16/2020   Procedure: Redo exposure ot Left below knee popiteal artery;  Surgeon: Marty Heck, MD;  Location: [facility];  Service: Vascular;  Laterality: Left;   THROMBECTOMY OF BYPASS GRAFT FEMORAL- POPLITEAL ARTERY Left 06/16/2020   Procedure: THROMBECTOMY  OF BYPASS GRAFT FEMORAL-POPLITEAL ARTERY and TIBIAL ARTERY;  Surgeon: Marty Heck, MD;  Location: [place];  Service: Vascular;  Laterality: Left;   THROMBECTOMY OF BYPASS GRAFT FEMORAL- POPLITEAL ARTERY Left 12/31/2020   Procedure: THROMBECTOMY OF LEFT COMMON FEMORAL TO BELOW KNEE POPLITEAL ARTERY BYPASS GRAFT;  Surgeon: Marty Heck, MD;  Location: [place];  Service: Vascular;  Laterality: Left;   VEIN HARVEST Left 12/31/2020   Procedure: Left Greater Saphenous Vein Harvest;  Surgeon: Marty Heck, MD;  Location: [place];  Service: Vascular;  Laterality: Left;   Patient Active Problem List   Diagnosis Date Noted   Peripheral artery disease ([place]) 12/31/2020   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[place]) 05/12/2020   Thrombosis of femoro-popliteal bypass graft ([place]) 05/12/2020   Critical lower limb ischemia ([place]) 12/11/2016   Claudication in peripheral vascular disease ([place]) 11/28/2016   HTN (hypertension) 10/09/2014   Tobacco use 10/09/2014   PAD (peripheral artery disease) ([place])     REFERRING DIAG: 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 He walked with the RW at the store and pumping gas. He has no trouble getting on and off the riding mower, but the prosthesis frequently falls off and he sits angled to reach brake and clutch with his right leg.  PAIN:  Are you having pain?  Yes: NPRS scale: arrived to PT 0/10.    Pain location: distal residual limb Pain description: cramping when he wakes up, soreness walking & aches when sitting with prosthesis. Aggravating factors: walking  with sudden increases in time Relieving factors: wearing prosthesis  OBJECTIVE:    POSTURE: 05/19/21:  rounded shoulders, forward head, flexed trunk , and weight shift right   LE ROM:   ROM P:passive  A:active Right 05/19/2021 Left 05/19/2021 Left 08/11/2021  Hip flexion       Hip extension   -15* standing -5* standing  Hip abduction       Hip adduction       Hip internal rotation       Hip external rotation       Knee flexion       Knee extension       Ankle dorsiflexion       Ankle plantarflexion       Ankle inversion       Ankle eversion        (Blank rows = not tested) MMT:   MMT Right 05/19/2021 Left 05/19/2021 Left 08/11/21  Hip flexion       Hip extension   3/5 Gross functional 4/5 Gross functional  Hip abduction   3/5 Gross functional 4/5 Gross functional  Hip adduction       Hip internal rotation       Hip external rotation       Knee flexion  Knee extension       Ankle dorsiflexion       Ankle plantarflexion       Ankle inversion       Ankle eversion       (Blank rows = not tested)   TRANSFERS: Sit to stand: 05/19/21:  SBA and requires UE assist with armrests & RW touch to stabilize Stand to sit: 05/19/21:  SBA and requires UE assist with armrests & RW stabilization   GAIT: 08/10/2021:  Pt neg curb with RW & TFA prosthesis modified independent. Pt neg ramp with RW & TFA prosthesis with verbal cues on hand position on RW to descend. Pt neg stairs with single rail & cane safely modified independent.   08/09/2021: pt amb 516' in 9 min with RW & TFA prosthesis modified independent. Post gait HR 67 SpO2 97%  Gait velocity: 1.10 ft/sec  He had one misstep that he self corrected.    Pattern: step to with decreased right step length, left hip hike.  Good prosthetic foot clearance, minimal abduction intermittently noted.   05/19/21:  Gait pattern: step to pattern, decreased step length- Right, decreased stance time- Left, decreased hip/knee flexion- Left,  circumduction- Left, Left hip hike, antalgic, trunk flexed, abducted- Left, and poor foot clearance- Left  Distance walked: 16' Assistive device utilized: Environmental consultant - 2 wheeled and TFA prosthesis  excessive UE weight bearing Level of assistance: SBA   FUNCTIONAL TESTs:  08/11/2021  TUG with cane stand alone tip:  PT had pt walk one TUG path with minA for warm-up.  Std TUG 95.03 sec with minA  Cog TUG 146.38 sec with minA with pt describing how to build doghouse, able to talk entire time including turns.  Manual TUG 137.65 sec carrying open bottle of water with no spills.  08/09/2021  Berg Balance 34/56 & tasks of Berg with cane support 46/56      Berg Balance Scale: 05/19/21:  17/56   [facility] PT Assessment - 05/19/21 0900                Standardized Balance Assessment    Standardized Balance Assessment Berg Balance Test          Berg Balance Test    Sit to Stand Needs minimal aid to stand or to stabilize     Standing Unsupported Able to stand 2 minutes with supervision     Sitting with Back Unsupported but Feet Supported on Floor or Stool Able to sit safely and securely 2 minutes     Stand to Sit Controls descent by using hands     Transfers Able to transfer safely, definite need of hands     Standing Unsupported with Eyes Closed Unable to keep eyes closed 3 seconds but stays steady     Standing Unsupported with Feet Together Needs help to attain position but able to stand for 30 seconds with feet together     From Standing, Reach Forward with Outstretched Arm Reaches forward but needs supervision     From Standing Position, Pick up Object from Floor Unable to try/needs assist to keep balance     From Standing Position, Turn to Look Behind Over each Shoulder Needs assist to keep from losing balance and falling     Turn 360 Degrees Needs assistance while turning     Standing Unsupported, Alternately Place Feet on Step/Stool Needs assistance to keep from falling or unable to try      Standing Unsupported, One Foot in [place]  Loses balance while stepping or standing     Standing on One Leg Unable to try or needs assist to prevent fall     Total Score 17     Berg comment: BERG  < 36 high risk for falls (close to 100%) 46-51 moderate (>50%)   37-45 significant (>80%) 52-55 lower (> 25%)                   CURRENT PROSTHETIC WEAR ASSESSMENT: 05/19/21:  Patient is dependent with: skin check, residual limb care, care of non-amputated limb, prosthetic cleaning, ply sock cleaning, correct ply sock adjustment, proper wear schedule/adjustment, and proper weight-bearing schedule/adjustment Donning prosthesis: SBA Doffing prosthesis: Modified independence Prosthetic wear tolerance: 5-6 hours/day, 16 of 16 days since delivery.  Prosthetic weight bearing tolerance: 5 minutes with partial weight on prosthesis & intermittent support with RW Residual limb condition: 2 invaginated areas with one not clean, 1 small area on incision that may be suture working way out, dry skin, cylinderical shape, nonpitting edema,  Prosthetic description: silicon liner with velcro lanyard, ischial containment socket with flexible inner liner, SAFETY knee (single axis nonhydraulic), K2 flexible keel foot    TODAY'S TREATMENT:  08/22/2021 Prosthetic Training with Transfemoral Prosthesis: Pt reports that his prosthesis vibrates or slides off riding mower and he sits side saddle so right foot can operate brake/clutch on left side of mower. PT suggested bolt to assist to limit prosthesis sliding sideways off mower on riding mower and using cane as modified hand control for clutch/brake once mower is in motion (sitting side saddle for 1 hour may aggravate his back or hip). pt verbalized understanding. PT education with verbal cues on claudication issues with benefit to continue exercise and indication for rest at moderate pain, pt verbalized understanding. Pt ambulated 25' X 2 with cane stand alone tip with turning  180* around obstacle 1st rep to right & 2nd rep to left with supervision. 1 instance of loss of balance with pt self-correcting. PT verbal cues on upright posture. Walking around obstacles 5' apart with turns right & left carrying open bottle of water with cane with supervision, 1 loss of balance pt self-corrected.  Walking around square with sidestepping & backwards with cane with supervision. Pt able to pick up 6" cone from floor with supervision & verbal cues for positioning. PT demo and verbal explanation of clearing obstacle with prosthesis and cane, pt verbalized understanding and returned demo over 1" (similar to threshold riser) x 2 with verbal cueing for foot/cane placement  Pt amb 80' with cane naming animals A-Z with supervision, 1 loss of balance pt self-corrected. Working on gait with distraction.    08/18/2021 Prosthetic Training with Transfemoral Prosthesis: Pt ambulated 25' X 2 with cane stand alone tip with turning 180* around obstacle 1st rep to right & 2nd rep to left with supervision.  PT verbal cues on pelvic orientation esp in turns, wt shift over prosthesis in stance & upright posture. Walking around square with sidestepping & backwards with cane with CGA & verbal cues on technique. Walking around obstacles 5' apart with turns right & left with cane with supervision.  Pt able to pick up 6" cone from floor with supervision & verbal cues. Pt amb 25' carrying open bottle of water with cane with supervision. Pt amb 45' with cane naming automotive items A-Z with supervision. Working on gait with distraction.   08/16/2021 Prosthetic Training with Transfemoral Prosthesis: Pt ambulated 20' with cane stand alone tip with turning 180*  with minA /min guard.  PT demo & verbal cues on cane placement, wt shift over prosthesis in stance & upright posture. Walking around square with sidestepping & backwards with cane with minA & verbal cues on technique. Walking around obstacles 5' apart  with turns right & left with cane with minA. Pt amb 25' carrying open bottle of water with cane with min guard. Pt amb 80' with cane naming foods A-Z with min guard. Working on gait with distraction.    Access Code: [email or web address] URL: [email or web address] Date: 07/04/2021 Prepared by: Jamey Reas  Exercises - Seated Hamstring Stretch with Strap  - 2 x daily - 7 x weekly - 1 sets - 2-3 reps - 30 seconds hold - Modified [place] Stretch  - 2 x daily - 7 x weekly - 1 sets - 2 reps - 30 seconds hold - Supine Dynamic Modified [place] Quad and Hip Flexor Dynamic Stretch  - 2 x daily - 7 x weekly - 1 sets - 2 reps - 30 seconds hold - Standing posture with back to counter  - 2 x daily - 7 x weekly - 1 sets - 1 reps - 5-10 minutes hold - Upright Stance at Door Frame Single Arm  - 3-6 x daily - 7 x weekly - 1 sets - 2 reps - 2 deep breathes hold - Upright Stance at Door Frame with Both Arms  - 3-6 x daily - 7 x weekly - 1 sets - 2 reps - 2 deep breathes hold - feet together eyes open on floor  - 1 x daily - 4-7 x weekly - 1 sets - 10 reps - 2 seconds hold - Feet Apart with Eyes Closed with Head Motions  - 1 x daily - 4-7 x weekly - 1 sets - 10 reps - 2 seconds hold - Wide stance on Foam Pad head movements  - 1 x daily - 4-7 x weekly - 1 sets - 10 reps - 2 seconds hold - Standing Quarter Turn with Counter Support  - 1 x daily - 4-7 x weekly - 1 sets - 10 reps - Side Stepping with Counter Support  - 1 x daily - 5-7 x weekly - 1 sets - 10 reps - Backward Walking with Counter Support  - 1 x daily - 5-7 x weekly - 1 sets - 10 reps    05/24/21 1151  PT Education  Education Details HEP at sink & residual limb pain vs phantom sensation vs phantom pain  Person(s) Educated Patient  Methods Explanation;Demonstration;Tactile cues;Verbal cues;Handout  Comprehension Verbalized understanding;Returned demonstration;Verbal cues required;Tactile cues required;Need further instruction   ASSESSMENT:    CLINICAL IMPRESSION: He is continuing to progress with less assistance for mobility tasks with cane and prosthesis, requiring supervision during dynamic balance tasks with some verbal cueing. He recovered 3 minor losses of balance without PT assistance. He reported no increases in pain with prosthetic wear time or activities over the weekend, continuing to progress towards low irritability wear time goals. He continues to benefit from skilled PT to improve dynamic balance and prosthetic training during gait.   OBJECTIVE IMPAIRMENTS Abnormal gait, decreased activity tolerance, decreased balance, decreased endurance, decreased knowledge of condition, decreased knowledge of use of DME, decreased mobility, decreased ROM, decreased strength, impaired flexibility, postural dysfunction, prosthetic dependency , and pain.    ACTIVITY LIMITATIONS community activity and occupation.    PERSONAL FACTORS Fitness, Time since onset of injury/illness/exacerbation, and 3+ comorbidities: see PMH  are also affecting patient's functional  outcome.    REHAB POTENTIAL: Good   CLINICAL DECISION MAKING: Evolving/moderate complexity   EVALUATION COMPLEXITY: Moderate     GOALS: Goals reviewed with patient? Yes   SHORT TERM GOALS: Target date: 09/08/2021   Patient verbalizes how to decrease residual limb pain. Goal status: NEW 08/11/2021 2.  Patient tolerates prosthesis >90% awake hrs /day without skin issues or limb pain </= 2/10 >/= 75% of days after standing / walking. Goal status: 08/11/21 met for time & no skin issues but ongoing for limb pain.    3. Patient ambulates 100' with cane stand alone tip without hand hold assist & prosthesis with min guard. Goal status: ongoing 08/11/2021  LONG TERM GOALS: Target date: 08/31/7076 (reset at re-certification)   Patient demonstrates & verbalized understanding of prosthetic care including issues to address limb pain to enable safe utilization of prosthesis. Baseline: SEE  OBJECTIVE DATA Goal status: ongoing 08/09/2021   Patient tolerates prosthesis wear >90% of awake hours without skin or limb pain issues. Baseline: SEE OBJECTIVE DATA Goal status: partially met 08/09/2021   Merrilee Jansky Balance >/= 45/56 to indicate lower fall risk Baseline: SEE OBJECTIVE DATA Goal status: reset to higher level on 08/11/2021   Patient ambulates >500' with LRAD & prosthesis modified independent. Baseline: SEE OBJECTIVE DATA Goal status: met 08/09/2021 with RW   Patient negotiates ramps, curbs & stairs with single rail with LRAD & prosthesis modified independent. Baseline: SEE OBJECTIVE DATA Goal status: met 08/11/2021 with RW  6.  Patient ambulates 100' with cane & prosthesis modified independent.  Baseline: SEE OBJECTIVE DATA Goal status: NEW 08/11/2021  7. Timed Up & Go with cane stand alone tip modified independent:  Std TUG <60sec, Cog TUG <59mn, Manual TUG <2 min Baseline: SEE OBJECTIVE DATA Goal status: NEW 08/11/2021   PLAN: PT FREQUENCY: 2x/week   PT DURATION: 8 weeks   PLANNED INTERVENTIONS: Therapeutic exercises, Therapeutic activity, Neuromuscular re-education, Balance training, Gait training, Patient/Family education, Stair training, Vestibular training, Prosthetic training, and DME instructions, Physical Performance Testing   PLAN FOR NEXT SESSION:  work towards updated STGs, static balance without UE support, household gait with cane stand alone tip including multi-tasking and changes of direction  [employer] Student-PT 08/22/2021, 10:48 AM   This entire session of physical therapy was performed under the direct supervision of PT signing evaluation /treatment. PT reviewed note and agrees.  RJamey Reas PT, DPT 08/22/2021, 12:36 PM

## 2021-08-24 ENCOUNTER — Ambulatory Visit (INDEPENDENT_AMBULATORY_CARE_PROVIDER_SITE_OTHER): Payer: Medicare Other | Admitting: Physical Therapy

## 2021-08-24 DIAGNOSIS — M79605 Pain in left leg: Secondary | ICD-10-CM | POA: Diagnosis not present

## 2021-08-24 DIAGNOSIS — M6281 Muscle weakness (generalized): Secondary | ICD-10-CM | POA: Diagnosis not present

## 2021-08-24 DIAGNOSIS — R2689 Other abnormalities of gait and mobility: Secondary | ICD-10-CM

## 2021-08-24 DIAGNOSIS — R293 Abnormal posture: Secondary | ICD-10-CM | POA: Diagnosis not present

## 2021-08-24 DIAGNOSIS — R2681 Unsteadiness on feet: Secondary | ICD-10-CM | POA: Diagnosis not present

## 2021-08-24 DIAGNOSIS — G546 Phantom limb syndrome with pain: Secondary | ICD-10-CM | POA: Diagnosis not present

## 2021-08-24 NOTE — Therapy (Signed)
OUTPATIENT PHYSICAL THERAPY PROSTHETIC TREATMENT NOTE  Patient Name: Ryan [name] MRN: [id number] DOB:1950/03/18, 72 y.o., male Today's Date: 08/24/2021  PCP: Iona Beard, MD REFERRING PROVIDER: Karoline Caldwell, PA-C  End of Session:   PT End of Session - 08/24/21 0939     Visit Number 29    Number of Visits 41    Date for PT Re-Evaluation 10/06/21    Authorization Type Medicare A/B & Generic commercial    Progress Note Due on Visit 35    PT Start Time 0930    PT Stop Time 1015    PT Time Calculation (min) 45 min    Equipment Utilized During Treatment Gait belt    Activity Tolerance Patient tolerated treatment well    Behavior During Therapy WFL for tasks assessed/performed             Past Medical History:  Diagnosis Date   COVID 2022   Hypertension    PVD (peripheral vascular disease) ([place])    Past Surgical History:  Procedure Laterality Date   ABDOMINAL AORTAGRAM Left 12/13/2016   ABDOMINAL AORTOGRAM W/LOWER EXTREMITY/notes 12/13/2016   ABDOMINAL AORTOGRAM W/LOWER EXTREMITY N/A 11/28/2016   Procedure: ABDOMINAL AORTOGRAM W/LOWER EXTREMITY;  Surgeon: Adrian Prows, MD;  Location: [facility];  Service: Cardiovascular;  Laterality: N/A;  bilateral   ABDOMINAL AORTOGRAM W/LOWER EXTREMITY N/A 12/13/2016   Procedure: ABDOMINAL AORTOGRAM W/LOWER EXTREMITY;  Surgeon: Waynetta Sandy, MD;  Location: [facility];  Service: Cardiovascular;  Laterality: N/A;  unilater left leg   ABDOMINAL AORTOGRAM W/LOWER EXTREMITY N/A 01/09/2018   Procedure: ABDOMINAL AORTOGRAM W/LOWER EXTREMITY;  Surgeon: Marty Heck, MD;  Location: [facility];  Service: Cardiovascular;  Laterality: N/A;   ABDOMINAL AORTOGRAM W/LOWER EXTREMITY Left 09/22/2019   Procedure: ABDOMINAL AORTOGRAM W/LOWER EXTREMITY;  Surgeon: Waynetta Sandy, MD;  Location: [facility];  Service: Cardiovascular;  Laterality: Left;   ABDOMINAL AORTOGRAM W/LOWER EXTREMITY Left  12/02/2020   Procedure: ABDOMINAL AORTOGRAM W/LOWER EXTREMITY;  Surgeon: Marty Heck, MD;  Location: [facility];  Service: Cardiovascular;  Laterality: Left;   ABDOMINAL AORTOGRAM W/LOWER EXTREMITY N/A 12/22/2020   Procedure: ABDOMINAL AORTOGRAM W/LOWER EXTREMITY;  Surgeon: Broadus John, MD;  Location: [facility];  Service: Cardiovascular;  Laterality: N/A;   AMPUTATION Left 02/11/2021   Procedure: LEFT ABOVE KNEE AMPUTATION;  Surgeon: Marty Heck, MD;  Location: [place];  Service: Vascular;  Laterality: Left;   BYPASS GRAFT POPLITEAL TO TIBIAL Left 12/31/2020   Procedure: LEFT JUMP GRAFT REVISION BELOW KNEE POPLITEAL TO PERONEAL;  Surgeon: Marty Heck, MD;  Location: [place];  Service: Vascular;  Laterality: Left;   EMBOLIZATION Left 12/03/2020   Procedure: EMBOLIZATION;  Surgeon: Cherre Robins, MD;  Location: [facility];  Service: Cardiovascular;  Laterality: Left;   FEMORAL-POPLITEAL BYPASS GRAFT Left 12/14/2016   Procedure: BYPASS GRAFT COMMON FEMORAL- BELOW KNEE POPLITEAL ARTERY with Bovine Patch to Below the knee poplitieal artery.;  Surgeon: Conrad Eutaw, MD;  Location: [place];  Service: Vascular;  Laterality: Left;   FEMORAL-POPLITEAL BYPASS GRAFT Left 01/28/2018   Procedure: BYPASS GRAFT FEMORAL-BELOW KNEE POPLITEAL ARTERY REDO with propaten vascular graft removable ring;  Surgeon: Marty Heck, MD;  Location: [place];  Service: Vascular;  Laterality: Left;   FEMUR SURGERY Right 2009   pt. has a rod in that leg   LOWER EXTREMITY ANGIOGRAPHY Left 11/28/2016   Procedure: Lower Extremity Angiography;  Surgeon: Einar Gip,  Ulice Dash, MD;  Location: [facility];  Service: Cardiovascular;  Laterality: Left;  lysis followup lower leg   PATCH ANGIOPLASTY Left 01/28/2018   Procedure: PATCH ANGIOPLASTY USING Rueben Bash BIOLOGIC PATCH;  Surgeon: Marty Heck, MD;  Location: [place];  Service: Vascular;  Laterality: Left;   PATCH ANGIOPLASTY  Left 06/16/2020   Procedure: PATCH ANGIOPLASTY OF LEFT BELOW KNEE [place] ARTERY;  Surgeon: Marty Heck, MD;  Location: [place];  Service: Vascular;  Laterality: Left;   PERIPHERAL VASCULAR BALLOON ANGIOPLASTY  11/28/2016   Procedure: PERIPHERAL VASCULAR BALLOON ANGIOPLASTY;  Surgeon: Adrian Prows, MD;  Location: [facility];  Service: Cardiovascular;;  SFA   PERIPHERAL VASCULAR BALLOON ANGIOPLASTY  12/03/2020   Procedure: PERIPHERAL VASCULAR BALLOON ANGIOPLASTY;  Surgeon: Cherre Robins, MD;  Location: [facility];  Service: Cardiovascular;;  TP Trunk and Peroneal   PERIPHERAL VASCULAR CATHETERIZATION N/A 08/11/2014   Procedure: Lower Extremity Angiography;  Surgeon: Adrian Prows, MD;  Location: [facility];  Service: Cardiovascular;  Laterality: N/A;   PERIPHERAL VASCULAR INTERVENTION Left 09/22/2019   Procedure: PERIPHERAL VASCULAR INTERVENTION;  Surgeon: Waynetta Sandy, MD;  Location: [facility];  Service: Cardiovascular;  Laterality: Left;   PERIPHERAL VASCULAR INTERVENTION Left 09/23/2019   Procedure: PERIPHERAL VASCULAR INTERVENTION;  Surgeon: Serafina Mitchell, MD;  Location: [facility];  Service: Cardiovascular;  Laterality: Left;   PERIPHERAL VASCULAR INTERVENTION Left 05/13/2020   Procedure: PERIPHERAL VASCULAR INTERVENTION;  Surgeon: Marty Heck, MD;  Location: [facility];  Service: Cardiovascular;  Laterality: Left;   PERIPHERAL VASCULAR INTERVENTION Left 12/02/2020   Procedure: PERIPHERAL VASCULAR INTERVENTION;  Surgeon: Marty Heck, MD;  Location: [facility];  Service: Cardiovascular;  Laterality: Left;   PERIPHERAL VASCULAR INTERVENTION  12/03/2020   Procedure: PERIPHERAL VASCULAR INTERVENTION;  Surgeon: Cherre Robins, MD;  Location: [facility];  Service: Cardiovascular;;   PERIPHERAL VASCULAR INTERVENTION Left 12/22/2020   Procedure: PERIPHERAL VASCULAR INTERVENTION;  Surgeon: Broadus John, MD;  Location: [facility];  Service: Cardiovascular;  Laterality: Left;   PERIPHERAL VASCULAR THROMBECTOMY  11/28/2016   Procedure: PERIPHERAL VASCULAR THROMBECTOMY;  Surgeon: Adrian Prows, MD;  Location: [facility];  Service: Cardiovascular;;  Profunda   PERIPHERAL VASCULAR THROMBECTOMY  11/28/2016   Procedure: PERIPHERAL VASCULAR THROMBECTOMY;  Surgeon: Adrian Prows, MD;  Location: [facility];  Service: Cardiovascular;;   PERIPHERAL VASCULAR THROMBECTOMY N/A 05/12/2020   Procedure: PERIPHERAL VASCULAR THROMBECTOMY-Left Leg;  Surgeon: Cherre Robins, MD;  Location: [facility];  Service: Cardiovascular;  Laterality: N/A;   PERIPHERAL VASCULAR THROMBECTOMY N/A 05/13/2020   Procedure: PERIPHERAL VASCULAR THROMBECTOMY- LYSIS RECHECK;  Surgeon: Marty Heck, MD;  Location: [facility];  Service: Cardiovascular;  Laterality: N/A;   PERIPHERAL VASCULAR THROMBECTOMY N/A 12/02/2020   Procedure: PERIPHERAL VASCULAR THROMBECTOMY;  Surgeon: Marty Heck, MD;  Location: [facility];  Service: Cardiovascular;  Laterality: N/A;   PERIPHERAL VASCULAR THROMBECTOMY N/A 12/03/2020   Procedure: LYSIS RECHECK;  Surgeon: Cherre Robins, MD;  Location: [facility];  Service: Cardiovascular;  Laterality: N/A;   PULMONARY THROMBECTOMY N/A 09/23/2019   Procedure: LYSIS RECHECK;  Surgeon: Serafina Mitchell, MD;  Location: [facility];  Service: Cardiovascular;  Laterality: N/A;   THROMBECTOMY FEMORAL ARTERY Left 12/14/2016   Procedure: THROMBECTOMY PROFUNDA FEMORAL ARTERY;  Surgeon: Conrad McDade, MD;  Location: [place];  Service: Vascular;  Laterality: Left;   THROMBECTOMY FEMORAL ARTERY Left 06/16/2020   Procedure: Redo exposure ot Left below knee popiteal artery;  Surgeon: Marty Heck, MD;  Location: [facility];  Service: Vascular;  Laterality: Left;   THROMBECTOMY OF BYPASS GRAFT FEMORAL- POPLITEAL ARTERY Left 06/16/2020   Procedure: THROMBECTOMY  OF BYPASS GRAFT FEMORAL-POPLITEAL ARTERY and TIBIAL ARTERY;  Surgeon: Marty Heck, MD;  Location: [place];  Service: Vascular;  Laterality: Left;   THROMBECTOMY OF BYPASS GRAFT FEMORAL- POPLITEAL ARTERY Left 12/31/2020   Procedure: THROMBECTOMY OF LEFT COMMON FEMORAL TO BELOW KNEE POPLITEAL ARTERY BYPASS GRAFT;  Surgeon: Marty Heck, MD;  Location: [place];  Service: Vascular;  Laterality: Left;   VEIN HARVEST Left 12/31/2020   Procedure: Left Greater Saphenous Vein Harvest;  Surgeon: Marty Heck, MD;  Location: [facility];  Service: Vascular;  Laterality: Left;   Patient Active Problem List   Diagnosis Date Noted   Peripheral artery disease ([place]) 12/31/2020   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[place]) 05/12/2020   Thrombosis of femoro-popliteal bypass graft ([place]) 05/12/2020   Critical lower limb ischemia ([place]) 12/11/2016   Claudication in peripheral vascular disease ([place]) 11/28/2016   HTN (hypertension) 10/09/2014   Tobacco use 10/09/2014   PAD (peripheral artery disease) ([place])     REFERRING DIAG: 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 He took a decongestant medication prior to PT appointment.  PAIN:  Are you having pain?  Yes: NPRS scale: arrived to PT 0/10.  Pain location: distal residual limb Pain description: cramping when he wakes up, soreness walking & aches when sitting with prosthesis. Aggravating factors: walking with sudden increases in time Relieving factors: wearing prosthesis  OBJECTIVE:     POSTURE: 05/19/21:  rounded shoulders, forward head, flexed trunk , and weight shift right   LE ROM:   ROM P:passive  A:active Right 05/19/2021 Left 05/19/2021 Left 08/11/2021  Hip flexion       Hip extension   -15* standing -5* standing  Hip abduction       Hip adduction       Hip internal rotation       Hip external rotation       Knee flexion       Knee extension       Ankle dorsiflexion       Ankle plantarflexion       Ankle inversion       Ankle eversion        (Blank rows = not tested) MMT:   MMT Right 05/19/2021 Left 05/19/2021 Left 08/11/21  Hip flexion       Hip extension   3/5 Gross functional 4/5 Gross functional  Hip abduction   3/5 Gross functional 4/5 Gross functional  Hip adduction       Hip internal rotation       Hip external rotation       Knee flexion       Knee extension       Ankle dorsiflexion  Ankle plantarflexion       Ankle inversion       Ankle eversion       (Blank rows = not tested)   TRANSFERS: Sit to stand: 05/19/21:  SBA and requires UE assist with armrests & RW touch to stabilize Stand to sit: 05/19/21:  SBA and requires UE assist with armrests & RW stabilization   GAIT: 08/10/2021:  Pt neg curb with RW & TFA prosthesis modified independent. Pt neg ramp with RW & TFA prosthesis with verbal cues on hand position on RW to descend. Pt neg stairs with single rail & cane safely modified independent.   08/09/2021: pt amb 516' in 9 min with RW & TFA prosthesis modified independent. Post gait HR 67 SpO2 97%  Gait velocity: 1.10 ft/sec  He had one misstep that he self corrected.    Pattern: step to with decreased right step length, left hip hike.  Good prosthetic foot clearance, minimal abduction intermittently noted.   05/19/21:  Gait pattern: step to pattern, decreased step length- Right, decreased stance time- Left, decreased hip/knee flexion- Left, circumduction- Left, Left hip hike, antalgic, trunk flexed, abducted- Left, and poor  foot clearance- Left  Distance walked: 70' Assistive device utilized: Environmental consultant - 2 wheeled and TFA prosthesis  excessive UE weight bearing Level of assistance: SBA   FUNCTIONAL TESTs:  08/11/2021  TUG with cane stand alone tip:  PT had pt walk one TUG path with minA for warm-up.  Std TUG 95.03 sec with minA  Cog TUG 146.38 sec with minA with pt describing how to build doghouse, able to talk entire time including turns.  Manual TUG 137.65 sec carrying open bottle of water with no spills.  08/09/2021  Berg Balance 34/56 & tasks of Berg with cane support 46/56    Berg Balance Scale: 05/19/21:  17/56   [facility] PT Assessment - 05/19/21 0900                Standardized Balance Assessment    Standardized Balance Assessment Berg Balance Test          Berg Balance Test    Sit to Stand Needs minimal aid to stand or to stabilize     Standing Unsupported Able to stand 2 minutes with supervision     Sitting with Back Unsupported but Feet Supported on Floor or Stool Able to sit safely and securely 2 minutes     Stand to Sit Controls descent by using hands     Transfers Able to transfer safely, definite need of hands     Standing Unsupported with Eyes Closed Unable to keep eyes closed 3 seconds but stays steady     Standing Unsupported with Feet Together Needs help to attain position but able to stand for 30 seconds with feet together     From Standing, Reach Forward with Outstretched Arm Reaches forward but needs supervision     From Standing Position, Pick up Object from Floor Unable to try/needs assist to keep balance     From Standing Position, Turn to Look Behind Over each Shoulder Needs assist to keep from losing balance and falling     Turn 360 Degrees Needs assistance while turning     Standing Unsupported, Alternately Place Feet on Step/Stool Needs assistance to keep from falling or unable to try     Standing Unsupported, One Foot in [employer] balance while stepping or standing     Standing  on One Leg Unable to try or  needs assist to prevent fall     Total Score 17     Berg comment: BERG  < 36 high risk for falls (close to 100%) 46-51 moderate (>50%)   37-45 significant (>80%) 52-55 lower (> 25%)                   CURRENT PROSTHETIC WEAR ASSESSMENT: 05/19/21:  Patient is dependent with: skin check, residual limb care, care of non-amputated limb, prosthetic cleaning, ply sock cleaning, correct ply sock adjustment, proper wear schedule/adjustment, and proper weight-bearing schedule/adjustment Donning prosthesis: SBA Doffing prosthesis: Modified independence Prosthetic wear tolerance: 5-6 hours/day, 16 of 16 days since delivery.  Prosthetic weight bearing tolerance: 5 minutes with partial weight on prosthesis & intermittent support with RW Residual limb condition: 2 invaginated areas with one not clean, 1 small area on incision that may be suture working way out, dry skin, cylinderical shape, nonpitting edema,  Prosthetic description: silicon liner with velcro lanyard, ischial containment socket with flexible inner liner, SAFETY knee (single axis nonhydraulic), K2 flexible keel foot    TODAY'S TREATMENT:  08/24/2021 Prosthetic Training with Transfemoral Prosthesis: Pt ambulated 50' X 1 with cane stand alone tip with turning 180* around obstacle 1st rep to right & 2nd rep to left with supervision.  Walking around square with sidestepping & backwards with cane with supervision and minA to recover one loss of balance. Walking around obstacles 5' apart with turns right & left carrying open bottle of water with cane with supervision Pt able to pick up 6" cone from floor x 2 with supervision & verbal cues for positioning following minA to recover one loss of balance. Pt verbalized understanding of which hand is easier and technique with TFA prosthesis.  Pt amb 75' with cane looking in all directions to identify items in environment with CGA, 1 loss of balance pt self-corrected. Pt had  to stop to scan all directions for >90% of time.  PT reminder to wear prosthesis all day even while sick, despite lower activity levels. PT instructed   08/22/2021 Prosthetic Training with Transfemoral Prosthesis: Pt reports that his prosthesis vibrates or slides off riding mower and he sits side saddle so right foot can operate brake/clutch on left side of mower. PT suggested bolt to assist to limit prosthesis sliding sideways off mower on riding mower and using cane as modified hand control for clutch/brake once mower is in motion (sitting side saddle for 1 hour may aggravate his back or hip). pt verbalized understanding. PT education with verbal cues on claudication issues with benefit to continue exercise and indication for rest at moderate pain, pt verbalized understanding. Pt ambulated 25' X 2 with cane stand alone tip with turning 180* around obstacle 1st rep to right & 2nd rep to left with supervision. 1 instance of loss of balance with pt self-correcting. PT verbal cues on upright posture. Walking around obstacles 5' apart with turns right & left carrying open bottle of water with cane with supervision, 1 loss of balance pt self-corrected.  Walking around square with sidestepping & backwards with cane with supervision. Pt able to pick up 6" cone from floor with supervision & verbal cues for positioning. PT demo and verbal explanation of clearing obstacle with prosthesis and cane, pt verbalized understanding and returned demo over 1" (similar to threshold riser) x 2 with verbal cueing for foot/cane placement  Pt amb 80' with cane naming animals A-Z with supervision, 1 loss of balance pt self-corrected. Working on gait with distraction.  08/18/2021 Prosthetic Training with Transfemoral Prosthesis: Pt ambulated 25' X 2 with cane stand alone tip with turning 180* around obstacle 1st rep to right & 2nd rep to left with supervision.  PT verbal cues on pelvic orientation esp in turns, wt shift  over prosthesis in stance & upright posture. Walking around square with sidestepping & backwards with cane with CGA & verbal cues on technique. Walking around obstacles 5' apart with turns right & left with cane with supervision.  Pt able to pick up 6" cone from floor with supervision & verbal cues. Pt amb 25' carrying open bottle of water with cane with supervision. Pt amb 82' with cane naming automotive items A-Z with supervision. Working on gait with distraction.    Access Code: [email or web address] URL: [email or web address] Date: 07/04/2021 Prepared by: Vladimir Faster  Exercises - Seated Hamstring Stretch with Strap  - 2 x daily - 7 x weekly - 1 sets - 2-3 reps - 30 seconds hold - Modified Esau Stretch  - 2 x daily - 7 x weekly - 1 sets - 2 reps - 30 seconds hold - Supine Dynamic Modified Gierke Quad and Hip Flexor Dynamic Stretch  - 2 x daily - 7 x weekly - 1 sets - 2 reps - 30 seconds hold - Standing posture with back to counter  - 2 x daily - 7 x weekly - 1 sets - 1 reps - 5-10 minutes hold - Upright Stance at Door Frame Single Arm  - 3-6 x daily - 7 x weekly - 1 sets - 2 reps - 2 deep breathes hold - Upright Stance at Door Frame with Both Arms  - 3-6 x daily - 7 x weekly - 1 sets - 2 reps - 2 deep breathes hold - feet together eyes open on floor  - 1 x daily - 4-7 x weekly - 1 sets - 10 reps - 2 seconds hold - Feet Apart with Eyes Closed with Head Motions  - 1 x daily - 4-7 x weekly - 1 sets - 10 reps - 2 seconds hold - Wide stance on Foam Pad head movements  - 1 x daily - 4-7 x weekly - 1 sets - 10 reps - 2 seconds hold - Standing Quarter Turn with Counter Support  - 1 x daily - 4-7 x weekly - 1 sets - 10 reps - Side Stepping with Counter Support  - 1 x daily - 5-7 x weekly - 1 sets - 10 reps - Backward Walking with Counter Support  - 1 x daily - 5-7 x weekly - 1 sets - 10 reps    05/24/21 1151  PT Education  Education Details HEP at sink & residual limb pain vs phantom  sensation vs phantom pain  Person(s) Educated Patient  Methods Explanation;Demonstration;Tactile cues;Verbal cues;Handout  Comprehension Verbalized understanding;Returned demonstration;Verbal cues required;Tactile cues required;Need further instruction   ASSESSMENT:   CLINICAL IMPRESSION: He required PT assistance twice to recover from losses of balance, likely due to congestion and sinus medication taken today. He continues to report no increases in pain with prosthesis wear time and no skin concerns. He continues to benefit from skilled PT to improve dynamic balance and prosthetic training during gait.   OBJECTIVE IMPAIRMENTS Abnormal gait, decreased activity tolerance, decreased balance, decreased endurance, decreased knowledge of condition, decreased knowledge of use of DME, decreased mobility, decreased ROM, decreased strength, impaired flexibility, postural dysfunction, prosthetic dependency , and pain.    ACTIVITY LIMITATIONS community activity and occupation.  PERSONAL FACTORS Fitness, Time since onset of injury/illness/exacerbation, and 3+ comorbidities: see PMH  are also affecting patient's functional outcome.    REHAB POTENTIAL: Good   CLINICAL DECISION MAKING: Evolving/moderate complexity   EVALUATION COMPLEXITY: Moderate     GOALS: Goals reviewed with patient? Yes   SHORT TERM GOALS: Target date: 09/08/2021   Patient verbalizes how to decrease residual limb pain. Goal status: ongoing 08/11/2021 2.  Patient tolerates prosthesis >90% awake hrs /day without skin issues or limb pain </= 2/10 >/= 75% of days after standing / walking. Goal status: 08/11/21 met for time & no skin issues but ongoing for limb pain.   3. Patient ambulates 100' with cane stand alone tip without hand hold assist & prosthesis with min guard. Goal status: ongoing 08/11/2021  LONG TERM GOALS: Target date: 4/0/7680 (reset at re-certification)   Patient demonstrates & verbalized understanding of  prosthetic care including issues to address limb pain to enable safe utilization of prosthesis. Baseline: SEE OBJECTIVE DATA Goal status: ongoing 08/09/2021   Patient tolerates prosthesis wear >90% of awake hours without skin or limb pain issues. Baseline: SEE OBJECTIVE DATA Goal status: partially met 08/09/2021   Merrilee Jansky Balance >/= 45/56 to indicate lower fall risk Baseline: SEE OBJECTIVE DATA Goal status: reset to higher level on 08/11/2021   Patient ambulates >500' with LRAD & prosthesis modified independent. Baseline: SEE OBJECTIVE DATA Goal status: met 08/09/2021 with RW   Patient negotiates ramps, curbs & stairs with single rail with LRAD & prosthesis modified independent. Baseline: SEE OBJECTIVE DATA Goal status: met 08/11/2021 with RW  6.  Patient ambulates 100' with cane & prosthesis modified independent.  Baseline: SEE OBJECTIVE DATA Goal status: NEW 08/11/2021  7. Timed Up & Go with cane stand alone tip modified independent:  Std TUG <60sec, Cog TUG <27min, Manual TUG <2 min Baseline: SEE OBJECTIVE DATA Goal status: NEW 08/11/2021   PLAN: PT FREQUENCY: 2x/week   PT DURATION: 8 weeks   PLANNED INTERVENTIONS: Therapeutic exercises, Therapeutic activity, Neuromuscular re-education, Balance training, Gait training, Patient/Family education, Stair training, Vestibular training, Prosthetic training, and DME instructions, Physical Performance Testing   PLAN FOR NEXT SESSION: work towards updated STGs, static balance without UE support and self-corrected foot placement, household gait with cane stand alone tip including multi-tasking and changes of direction  Jamey Reas, PT 08/24/2021, 12:57 PM   This entire session of physical therapy was performed under the direct supervision of PT signing evaluation /treatment. PT reviewed note and agrees.  Jamey Reas, PT, DPT 08/24/2021, 1:04 PM

## 2021-08-26 NOTE — Therapy (Signed)
OUTPATIENT PHYSICAL THERAPY PROSTHETIC TREATMENT NOTE  Patient Name: Ryan [name] MRN: [id number] DOB:Dec 30, 1950, 71 y.o., male Today's Date: 08/29/2021  PCP: Iona Beard, MD REFERRING PROVIDER: Karoline Caldwell, PA-C  End of Session:   PT End of Session - 08/29/21 1021     Visit Number 30    Number of Visits 41    Date for PT Re-Evaluation 10/06/21    Authorization Type Medicare A/B & Generic commercial    Progress Note Due on Visit 35    PT Start Time 0934    PT Stop Time 1018    PT Time Calculation (min) 44 min    Equipment Utilized During Treatment Gait belt    Activity Tolerance Patient tolerated treatment well    Behavior During Therapy WFL for tasks assessed/performed              Past Medical History:  Diagnosis Date   COVID 2022   Hypertension    PVD (peripheral vascular disease) ([place])    Past Surgical History:  Procedure Laterality Date   ABDOMINAL AORTAGRAM Left 12/13/2016   ABDOMINAL AORTOGRAM W/LOWER EXTREMITY/notes 12/13/2016   ABDOMINAL AORTOGRAM W/LOWER EXTREMITY N/A 11/28/2016   Procedure: ABDOMINAL AORTOGRAM W/LOWER EXTREMITY;  Surgeon: Adrian Prows, MD;  Location: [facility];  Service: Cardiovascular;  Laterality: N/A;  bilateral   ABDOMINAL AORTOGRAM W/LOWER EXTREMITY N/A 12/13/2016   Procedure: ABDOMINAL AORTOGRAM W/LOWER EXTREMITY;  Surgeon: Waynetta Sandy, MD;  Location: [place] [facility];  Service: Cardiovascular;  Laterality: N/A;  unilater left leg   ABDOMINAL AORTOGRAM W/LOWER EXTREMITY N/A 01/09/2018   Procedure: ABDOMINAL AORTOGRAM W/LOWER EXTREMITY;  Surgeon: Marty Heck, MD;  Location: [facility];  Service: Cardiovascular;  Laterality: N/A;   ABDOMINAL AORTOGRAM W/LOWER EXTREMITY Left 09/22/2019   Procedure: ABDOMINAL AORTOGRAM W/LOWER EXTREMITY;  Surgeon: Waynetta Sandy, MD;  Location: [facility];  Service: Cardiovascular;  Laterality: Left;   ABDOMINAL AORTOGRAM W/LOWER EXTREMITY  Left 12/02/2020   Procedure: ABDOMINAL AORTOGRAM W/LOWER EXTREMITY;  Surgeon: Marty Heck, MD;  Location: [facility];  Service: Cardiovascular;  Laterality: Left;   ABDOMINAL AORTOGRAM W/LOWER EXTREMITY N/A 12/22/2020   Procedure: ABDOMINAL AORTOGRAM W/LOWER EXTREMITY;  Surgeon: Broadus John, MD;  Location: [facility];  Service: Cardiovascular;  Laterality: N/A;   AMPUTATION Left 02/11/2021   Procedure: LEFT ABOVE KNEE AMPUTATION;  Surgeon: Marty Heck, MD;  Location: [facility];  Service: Vascular;  Laterality: Left;   BYPASS GRAFT POPLITEAL TO TIBIAL Left 12/31/2020   Procedure: LEFT JUMP GRAFT REVISION BELOW KNEE POPLITEAL TO PERONEAL;  Surgeon: Marty Heck, MD;  Location: [place];  Service: Vascular;  Laterality: Left;   EMBOLIZATION Left 12/03/2020   Procedure: EMBOLIZATION;  Surgeon: Cherre Robins, MD;  Location: [facility];  Service: Cardiovascular;  Laterality: Left;   FEMORAL-POPLITEAL BYPASS GRAFT Left 12/14/2016   Procedure: BYPASS GRAFT COMMON FEMORAL- BELOW KNEE POPLITEAL ARTERY with Bovine Patch to Below the knee poplitieal artery.;  Surgeon: Conrad Quamba, MD;  Location: [place];  Service: Vascular;  Laterality: Left;   FEMORAL-POPLITEAL BYPASS GRAFT Left 01/28/2018   Procedure: BYPASS GRAFT FEMORAL-BELOW KNEE POPLITEAL ARTERY REDO with propaten vascular graft removable ring;  Surgeon: Marty Heck, MD;  Location: [place];  Service: Vascular;  Laterality: Left;   FEMUR SURGERY Right 2009   pt. has a rod in that leg   LOWER EXTREMITY ANGIOGRAPHY Left 11/28/2016   Procedure: Lower Extremity Angiography;  Surgeon:  Adrian Prows, MD;  Location: [place] [facility];  Service: Cardiovascular;  Laterality: Left;  lysis followup lower leg   PATCH ANGIOPLASTY Left 01/28/2018   Procedure: PATCH ANGIOPLASTY USING Rueben Bash BIOLOGIC PATCH;  Surgeon: Marty Heck, MD;  Location: [place];  Service: Vascular;  Laterality: Left;   PATCH  ANGIOPLASTY Left 06/16/2020   Procedure: PATCH ANGIOPLASTY OF LEFT BELOW KNEE [place] ARTERY;  Surgeon: Marty Heck, MD;  Location: [place];  Service: Vascular;  Laterality: Left;   PERIPHERAL VASCULAR BALLOON ANGIOPLASTY  11/28/2016   Procedure: PERIPHERAL VASCULAR BALLOON ANGIOPLASTY;  Surgeon: Adrian Prows, MD;  Location: [facility];  Service: Cardiovascular;;  SFA   PERIPHERAL VASCULAR BALLOON ANGIOPLASTY  12/03/2020   Procedure: PERIPHERAL VASCULAR BALLOON ANGIOPLASTY;  Surgeon: Cherre Robins, MD;  Location: [facility];  Service: Cardiovascular;;  TP Trunk and Peroneal   PERIPHERAL VASCULAR CATHETERIZATION N/A 08/11/2014   Procedure: Lower Extremity Angiography;  Surgeon: Adrian Prows, MD;  Location: [facility];  Service: Cardiovascular;  Laterality: N/A;   PERIPHERAL VASCULAR INTERVENTION Left 09/22/2019   Procedure: PERIPHERAL VASCULAR INTERVENTION;  Surgeon: Waynetta Sandy, MD;  Location: [facility];  Service: Cardiovascular;  Laterality: Left;   PERIPHERAL VASCULAR INTERVENTION Left 09/23/2019   Procedure: PERIPHERAL VASCULAR INTERVENTION;  Surgeon: Serafina Mitchell, MD;  Location: [facility];  Service: Cardiovascular;  Laterality: Left;   PERIPHERAL VASCULAR INTERVENTION Left 05/13/2020   Procedure: PERIPHERAL VASCULAR INTERVENTION;  Surgeon: Marty Heck, MD;  Location: [facility];  Service: Cardiovascular;  Laterality: Left;   PERIPHERAL VASCULAR INTERVENTION Left 12/02/2020   Procedure: PERIPHERAL VASCULAR INTERVENTION;  Surgeon: Marty Heck, MD;  Location: [facility];  Service: Cardiovascular;  Laterality: Left;   PERIPHERAL VASCULAR INTERVENTION  12/03/2020   Procedure: PERIPHERAL VASCULAR INTERVENTION;  Surgeon: Cherre Robins, MD;  Location: [facility];  Service: Cardiovascular;;   PERIPHERAL VASCULAR INTERVENTION Left 12/22/2020   Procedure: PERIPHERAL VASCULAR INTERVENTION;  Surgeon:  Broadus John, MD;  Location: [facility];  Service: Cardiovascular;  Laterality: Left;   PERIPHERAL VASCULAR THROMBECTOMY  11/28/2016   Procedure: PERIPHERAL VASCULAR THROMBECTOMY;  Surgeon: Adrian Prows, MD;  Location: [facility];  Service: Cardiovascular;;  Profunda   PERIPHERAL VASCULAR THROMBECTOMY  11/28/2016   Procedure: PERIPHERAL VASCULAR THROMBECTOMY;  Surgeon: Adrian Prows, MD;  Location: [facility];  Service: Cardiovascular;;   PERIPHERAL VASCULAR THROMBECTOMY N/A 05/12/2020   Procedure: PERIPHERAL VASCULAR THROMBECTOMY-Left Leg;  Surgeon: Cherre Robins, MD;  Location: [facility];  Service: Cardiovascular;  Laterality: N/A;   PERIPHERAL VASCULAR THROMBECTOMY N/A 05/13/2020   Procedure: PERIPHERAL VASCULAR THROMBECTOMY- LYSIS RECHECK;  Surgeon: Marty Heck, MD;  Location: [facility];  Service: Cardiovascular;  Laterality: N/A;   PERIPHERAL VASCULAR THROMBECTOMY N/A 12/02/2020   Procedure: PERIPHERAL VASCULAR THROMBECTOMY;  Surgeon: Marty Heck, MD;  Location: [facility];  Service: Cardiovascular;  Laterality: N/A;   PERIPHERAL VASCULAR THROMBECTOMY N/A 12/03/2020   Procedure: LYSIS RECHECK;  Surgeon: Cherre Robins, MD;  Location: [facility];  Service: Cardiovascular;  Laterality: N/A;   PULMONARY THROMBECTOMY N/A 09/23/2019   Procedure: LYSIS RECHECK;  Surgeon: Serafina Mitchell, MD;  Location: [facility];  Service: Cardiovascular;  Laterality: N/A;   THROMBECTOMY FEMORAL ARTERY Left 12/14/2016   Procedure: THROMBECTOMY PROFUNDA FEMORAL ARTERY;  Surgeon: Conrad [place], MD;  Location: [place];  Service: Vascular;  Laterality: Left;   THROMBECTOMY FEMORAL ARTERY Left 06/16/2020   Procedure: Redo exposure ot Left below knee popiteal artery;  Surgeon: Marty Heck, MD;  Location: [facility];  Service: Vascular;  Laterality: Left;   THROMBECTOMY OF BYPASS GRAFT FEMORAL- POPLITEAL ARTERY Left 06/16/2020    Procedure: THROMBECTOMY OF BYPASS GRAFT FEMORAL-POPLITEAL ARTERY and TIBIAL ARTERY;  Surgeon: Marty Heck, MD;  Location: [place];  Service: Vascular;  Laterality: Left;   THROMBECTOMY OF BYPASS GRAFT FEMORAL- POPLITEAL ARTERY Left 12/31/2020   Procedure: THROMBECTOMY OF LEFT COMMON FEMORAL TO BELOW KNEE POPLITEAL ARTERY BYPASS GRAFT;  Surgeon: Marty Heck, MD;  Location: [place];  Service: Vascular;  Laterality: Left;   VEIN HARVEST Left 12/31/2020   Procedure: Left Greater Saphenous Vein Harvest;  Surgeon: Marty Heck, MD;  Location: [place];  Service: Vascular;  Laterality: Left;   Patient Active Problem List   Diagnosis Date Noted   Peripheral artery disease ([place]) 12/31/2020   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[place]) 05/12/2020   Thrombosis of femoro-popliteal bypass graft ([place]) 05/12/2020   Critical lower limb ischemia ([place]) 12/11/2016   Claudication in peripheral vascular disease ([place]) 11/28/2016   HTN (hypertension) 10/09/2014   Tobacco use 10/09/2014   PAD (peripheral artery disease) ([place])     REFERRING DIAG: 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  PAIN:  Are you having pain?  Yes: NPRS scale: arrived to PT 0/10.  Pain location: distal residual limb Pain description: cramping when he wakes up, soreness walking & aches when sitting with prosthesis. Aggravating factors: walking with sudden increases in time Relieving factors: wearing prosthesis  OBJECTIVE:    POSTURE: 05/19/21:  rounded shoulders, forward head, flexed trunk , and  weight shift right   LE ROM:   ROM P:passive  A:active Right 05/19/2021 Left 05/19/2021 Left 08/11/2021  Hip flexion       Hip extension   -15* standing -5* standing  Hip abduction       Hip adduction       Hip internal rotation       Hip external rotation       Knee flexion       Knee extension       Ankle dorsiflexion       Ankle plantarflexion       Ankle inversion       Ankle eversion        (Blank rows = not tested) MMT:   MMT Right 05/19/2021 Left 05/19/2021 Left 08/11/21  Hip flexion       Hip extension   3/5 Gross functional 4/5 Gross functional  Hip abduction   3/5 Gross functional 4/5 Gross functional  Hip adduction       Hip internal rotation       Hip external rotation       Knee flexion       Knee extension       Ankle dorsiflexion       Ankle plantarflexion       Ankle inversion  Ankle eversion       (Blank rows = not tested)   TRANSFERS: Sit to stand: 05/19/21:  SBA and requires UE assist with armrests & RW touch to stabilize Stand to sit: 05/19/21:  SBA and requires UE assist with armrests & RW stabilization   GAIT: 08/10/2021:  Pt neg curb with RW & TFA prosthesis modified independent. Pt neg ramp with RW & TFA prosthesis with verbal cues on hand position on RW to descend. Pt neg stairs with single rail & cane safely modified independent.   08/09/2021: pt amb 516' in 9 min with RW & TFA prosthesis modified independent. Post gait HR 67 SpO2 97%  Gait velocity: 1.10 ft/sec  He had one misstep that he self corrected.    Pattern: step to with decreased right step length, left hip hike.  Good prosthetic foot clearance, minimal abduction intermittently noted.   05/19/21:  Gait pattern: step to pattern, decreased step length- Right, decreased stance time- Left, decreased hip/knee flexion- Left, circumduction- Left, Left hip hike, antalgic, trunk flexed, abducted- Left, and poor foot clearance- Left  Distance walked: 32' Assistive device utilized:  Environmental consultant - 2 wheeled and TFA prosthesis  excessive UE weight bearing Level of assistance: SBA   FUNCTIONAL TESTs:  08/11/2021  TUG with cane stand alone tip:  PT had pt walk one TUG path with minA for warm-up.  Std TUG 95.03 sec with minA  Cog TUG 146.38 sec with minA with pt describing how to build doghouse, able to talk entire time including turns.  Manual TUG 137.65 sec carrying open bottle of water with no spills.  08/09/2021  Berg Balance 34/56 & tasks of Berg with cane support 46/56    Berg Balance Scale: 05/19/21:  17/56   [facility] PT Assessment - 05/19/21 0900                Standardized Balance Assessment    Standardized Balance Assessment Berg Balance Test          Berg Balance Test    Sit to Stand Needs minimal aid to stand or to stabilize     Standing Unsupported Able to stand 2 minutes with supervision     Sitting with Back Unsupported but Feet Supported on Floor or Stool Able to sit safely and securely 2 minutes     Stand to Sit Controls descent by using hands     Transfers Able to transfer safely, definite need of hands     Standing Unsupported with Eyes Closed Unable to keep eyes closed 3 seconds but stays steady     Standing Unsupported with Feet Together Needs help to attain position but able to stand for 30 seconds with feet together     From Standing, Reach Forward with Outstretched Arm Reaches forward but needs supervision     From Standing Position, Pick up Object from Floor Unable to try/needs assist to keep balance     From Standing Position, Turn to Look Behind Over each Shoulder Needs assist to keep from losing balance and falling     Turn 360 Degrees Needs assistance while turning     Standing Unsupported, Alternately Place Feet on Step/Stool Needs assistance to keep from falling or unable to try     Standing Unsupported, One Foot in [employer] balance while stepping or standing     Standing on One Leg Unable to try or needs assist to prevent fall     Total  Score 17  Berg comment: BERG  < 36 high risk for falls (close to 100%) 46-51 moderate (>50%)   37-45 significant (>80%) 52-55 lower (> 25%)                   CURRENT PROSTHETIC WEAR ASSESSMENT: 05/19/21:  Patient is dependent with: skin check, residual limb care, care of non-amputated limb, prosthetic cleaning, ply sock cleaning, correct ply sock adjustment, proper wear schedule/adjustment, and proper weight-bearing schedule/adjustment Donning prosthesis: SBA Doffing prosthesis: Modified independence Prosthetic wear tolerance: 5-6 hours/day, 16 of 16 days since delivery.  Prosthetic weight bearing tolerance: 5 minutes with partial weight on prosthesis & intermittent support with RW Residual limb condition: 2 invaginated areas with one not clean, 1 small area on incision that may be suture working way out, dry skin, cylinderical shape, nonpitting edema,  Prosthetic description: silicon liner with velcro lanyard, ischial containment socket with flexible inner liner, SAFETY knee (single axis nonhydraulic), K2 flexible keel foot    TODAY'S TREATMENT:  08/29/2021 Prosthetic Training with Transfemoral Prosthesis: Pt ambulated 50' X 1 with cane stand alone tip with turning 180* around obstacle 1st rep to right & 2nd rep to left with supervision, 1 loss of balance pt self-corrected and pt noted mild/moderate increase Rt lower leg pain with activity Walking around obstacles 5' apart with turns right & left carrying 2lb weighted ball with cane with supervision, verbal cueing for increased weight shift on prosthesis during stance phase Static standing balance no UE support x 30s no losses of balance Standing turning to look over bil. shoulder x 5 ea., verbal and visual cueing for weight shift and hip rotation Standing UE reach 5" anterior to lift cone off stack and return it x 6 reps bil, verbal cueing for weight shifting Pt amb 80' around equipment in gym and through doorway with cane while pt  explaining construction of doghouse for distraction, 1 loss of balance pt self-corrected. Working on gait around household furniture/doorways with distraction.   08/24/2021 Prosthetic Training with Transfemoral Prosthesis: Pt ambulated 50' X 1 with cane stand alone tip with turning 180* around obstacle 1st rep to right & 2nd rep to left with supervision.  Walking around square with sidestepping & backwards with cane with supervision and minA to recover one loss of balance. Walking around obstacles 5' apart with turns right & left carrying open bottle of water with cane with supervision Pt able to pick up 6" cone from floor x 2 with supervision & verbal cues for positioning following minA to recover one loss of balance. Pt verbalized understanding of which hand is easier and technique with TFA prosthesis.  Pt amb 62' with cane looking in all directions to identify items in environment with CGA, 1 loss of balance pt self-corrected. Pt had to stop to scan all directions for >90% of time.  PT reminder to wear prosthesis all day even while sick, despite lower activity levels. PT instructed   08/22/2021 Prosthetic Training with Transfemoral Prosthesis: Pt reports that his prosthesis vibrates or slides off riding mower and he sits side saddle so right foot can operate brake/clutch on left side of mower. PT suggested bolt to assist to limit prosthesis sliding sideways off mower on riding mower and using cane as modified hand control for clutch/brake once mower is in motion (sitting side saddle for 1 hour may aggravate his back or hip). pt verbalized understanding. PT education with verbal cues on claudication issues with benefit to continue exercise and indication for rest at moderate  pain, pt verbalized understanding. Pt ambulated 25' X 2 with cane stand alone tip with turning 180* around obstacle 1st rep to right & 2nd rep to left with supervision. 1 instance of loss of balance with pt self-correcting. PT  verbal cues on upright posture. Walking around obstacles 5' apart with turns right & left carrying open bottle of water with cane with supervision, 1 loss of balance pt self-corrected.  Walking around square with sidestepping & backwards with cane with supervision. Pt able to pick up 6" cone from floor with supervision & verbal cues for positioning. PT demo and verbal explanation of clearing obstacle with prosthesis and cane, pt verbalized understanding and returned demo over 1" (similar to threshold riser) x 2 with verbal cueing for foot/cane placement  Pt amb 80' with cane naming animals A-Z with supervision, 1 loss of balance pt self-corrected. Working on gait with distraction.    Access Code: [email or web address] URL: [email or web address] Date: 07/04/2021 Prepared by: Jamey Reas  Exercises - Seated Hamstring Stretch with Strap  - 2 x daily - 7 x weekly - 1 sets - 2-3 reps - 30 seconds hold - Modified Hainline Stretch  - 2 x daily - 7 x weekly - 1 sets - 2 reps - 30 seconds hold - Supine Dynamic Modified Sylvestre Quad and Hip Flexor Dynamic Stretch  - 2 x daily - 7 x weekly - 1 sets - 2 reps - 30 seconds hold - Standing posture with back to counter  - 2 x daily - 7 x weekly - 1 sets - 1 reps - 5-10 minutes hold - Upright Stance at Door Frame Single Arm  - 3-6 x daily - 7 x weekly - 1 sets - 2 reps - 2 deep breathes hold - Upright Stance at Door Frame with Both Arms  - 3-6 x daily - 7 x weekly - 1 sets - 2 reps - 2 deep breathes hold - feet together eyes open on floor  - 1 x daily - 4-7 x weekly - 1 sets - 10 reps - 2 seconds hold - Feet Apart with Eyes Closed with Head Motions  - 1 x daily - 4-7 x weekly - 1 sets - 10 reps - 2 seconds hold - Wide stance on Foam Pad head movements  - 1 x daily - 4-7 x weekly - 1 sets - 10 reps - 2 seconds hold - Standing Quarter Turn with Counter Support  - 1 x daily - 4-7 x weekly - 1 sets - 10 reps - Side Stepping with Counter Support  - 1 x daily - 5-7  x weekly - 1 sets - 10 reps - Backward Walking with Counter Support  - 1 x daily - 5-7 x weekly - 1 sets - 10 reps    05/24/21 1151  PT Education  Education Details HEP at sink & residual limb pain vs phantom sensation vs phantom pain  Person(s) Educated Patient  Methods Explanation;Demonstration;Tactile cues;Verbal cues;Handout  Comprehension Verbalized understanding;Returned demonstration;Verbal cues required;Tactile cues required;Need further instruction   ASSESSMENT:   CLINICAL IMPRESSION: He is continuing to progress with dynamic gait tasks related to household mobility with the cane in RUE and supervision, including navigating obstacles, turning, carrying unilateral weight, and walking with distraction. He recovered 2 minor losses of balance independently, and benefited from verbal cueing for increased weight shift during gait to prosthetic leg. He is safe with static balance with no UE support and independent with prosthesis placement, and required cueing for weight  shift with reaching and turning balance and can benefit further from standing weight shift tasks. He continues to benefit from skilled PT.   OBJECTIVE IMPAIRMENTS Abnormal gait, decreased activity tolerance, decreased balance, decreased endurance, decreased knowledge of condition, decreased knowledge of use of DME, decreased mobility, decreased ROM, decreased strength, impaired flexibility, postural dysfunction, prosthetic dependency , and pain.    ACTIVITY LIMITATIONS community activity and occupation.    PERSONAL FACTORS Fitness, Time since onset of injury/illness/exacerbation, and 3+ comorbidities: see PMH  are also affecting patient's functional outcome.    REHAB POTENTIAL: Good   CLINICAL DECISION MAKING: Evolving/moderate complexity   EVALUATION COMPLEXITY: Moderate     GOALS: Goals reviewed with patient? Yes   SHORT TERM GOALS: Target date: 09/08/2021   Patient verbalizes how to decrease residual limb  pain. Goal status: ongoing 08/11/2021 2.  Patient tolerates prosthesis >90% awake hrs /day without skin issues or limb pain </= 2/10 >/= 75% of days after standing / walking. Goal status: 08/11/21 met for time & no skin issues but ongoing for limb pain.   3. Patient ambulates 100' with cane stand alone tip without hand hold assist & prosthesis with min guard. Goal status: ongoing 08/11/2021  LONG TERM GOALS: Target date: 07/08/4852 (reset at re-certification)   Patient demonstrates & verbalized understanding of prosthetic care including issues to address limb pain to enable safe utilization of prosthesis. Baseline: SEE OBJECTIVE DATA Goal status: ongoing 08/09/2021   Patient tolerates prosthesis wear >90% of awake hours without skin or limb pain issues. Baseline: SEE OBJECTIVE DATA Goal status: partially met 08/09/2021   Merrilee Jansky Balance >/= 45/56 to indicate lower fall risk Baseline: SEE OBJECTIVE DATA Goal status: reset to higher level on 08/11/2021   Patient ambulates >500' with LRAD & prosthesis modified independent. Baseline: SEE OBJECTIVE DATA Goal status: met 08/09/2021 with RW   Patient negotiates ramps, curbs & stairs with single rail with LRAD & prosthesis modified independent. Baseline: SEE OBJECTIVE DATA Goal status: met 08/11/2021 with RW  6.  Patient ambulates 100' with cane & prosthesis modified independent.  Baseline: SEE OBJECTIVE DATA Goal status: NEW 08/11/2021  7. Timed Up & Go with cane stand alone tip modified independent:  Std TUG <60sec, Cog TUG <2min, Manual TUG <2 min Baseline: SEE OBJECTIVE DATA Goal status: NEW 08/11/2021   PLAN: PT FREQUENCY: 2x/week   PT DURATION: 8 weeks   PLANNED INTERVENTIONS: Therapeutic exercises, Therapeutic activity, Neuromuscular re-education, Balance training, Gait training, Patient/Family education, Stair training, Vestibular training, Prosthetic training, and DME instructions, Physical Performance Testing   PLAN FOR NEXT SESSION:  work towards updated STGs, static balance without UE support requiring A-P and M-L weight shift, household gait with cane stand alone tip including multi-tasking/scanning environment and changes of direction  [employer], Student-PT 08/29/2021, 10:41 AM   This entire session of physical therapy was performed under the direct supervision of PT signing evaluation /treatment. PT reviewed note and agrees.  Jamey Reas, PT, DPT 08/29/2021, 1:29 PM

## 2021-08-29 ENCOUNTER — Ambulatory Visit (INDEPENDENT_AMBULATORY_CARE_PROVIDER_SITE_OTHER): Payer: Medicare Other | Admitting: Physical Therapy

## 2021-08-29 ENCOUNTER — Encounter: Payer: Self-pay | Admitting: Physical Therapy

## 2021-08-29 DIAGNOSIS — R2689 Other abnormalities of gait and mobility: Secondary | ICD-10-CM

## 2021-08-29 DIAGNOSIS — M79605 Pain in left leg: Secondary | ICD-10-CM

## 2021-08-29 DIAGNOSIS — R2681 Unsteadiness on feet: Secondary | ICD-10-CM

## 2021-08-29 DIAGNOSIS — E119 Type 2 diabetes mellitus without complications: Secondary | ICD-10-CM | POA: Diagnosis not present

## 2021-08-29 DIAGNOSIS — M6281 Muscle weakness (generalized): Secondary | ICD-10-CM | POA: Diagnosis not present

## 2021-08-29 DIAGNOSIS — R293 Abnormal posture: Secondary | ICD-10-CM

## 2021-08-29 DIAGNOSIS — G546 Phantom limb syndrome with pain: Secondary | ICD-10-CM

## 2021-08-29 DIAGNOSIS — I10 Essential (primary) hypertension: Secondary | ICD-10-CM | POA: Diagnosis not present

## 2021-08-29 DIAGNOSIS — E785 Hyperlipidemia, unspecified: Secondary | ICD-10-CM | POA: Diagnosis not present

## 2021-08-30 NOTE — Therapy (Signed)
OUTPATIENT PHYSICAL THERAPY PROSTHETIC TREATMENT NOTE  Patient Name: Ryan [name] MRN: [id number] DOB:Jun 13, 1950, 71 y.o., male Today's Date: 08/31/2021  PCP: Iona Beard, MD REFERRING PROVIDER: Karoline Caldwell, PA-C  End of Session:   PT End of Session - 08/31/21 0931     Visit Number 31    Number of Visits 41    Date for PT Re-Evaluation 10/06/21    Authorization Type Medicare A/B & Generic commercial    Progress Note Due on Visit 35    PT Start Time 0931    PT Stop Time 1017    PT Time Calculation (min) 46 min    Equipment Utilized During Treatment Gait belt    Activity Tolerance Patient tolerated treatment well    Behavior During Therapy WFL for tasks assessed/performed             Past Medical History:  Diagnosis Date   COVID 2022   Hypertension    PVD (peripheral vascular disease) ([place])    Past Surgical History:  Procedure Laterality Date   ABDOMINAL AORTAGRAM Left 12/13/2016   ABDOMINAL AORTOGRAM W/LOWER EXTREMITY/notes 12/13/2016   ABDOMINAL AORTOGRAM W/LOWER EXTREMITY N/A 11/28/2016   Procedure: ABDOMINAL AORTOGRAM W/LOWER EXTREMITY;  Surgeon: Adrian Prows, MD;  Location: [facility];  Service: Cardiovascular;  Laterality: N/A;  bilateral   ABDOMINAL AORTOGRAM W/LOWER EXTREMITY N/A 12/13/2016   Procedure: ABDOMINAL AORTOGRAM W/LOWER EXTREMITY;  Surgeon: Waynetta Sandy, MD;  Location: [place] [facility];  Service: Cardiovascular;  Laterality: N/A;  unilater left leg   ABDOMINAL AORTOGRAM W/LOWER EXTREMITY N/A 01/09/2018   Procedure: ABDOMINAL AORTOGRAM W/LOWER EXTREMITY;  Surgeon: Marty Heck, MD;  Location: [place] [facility];  Service: Cardiovascular;  Laterality: N/A;   ABDOMINAL AORTOGRAM W/LOWER EXTREMITY Left 09/22/2019   Procedure: ABDOMINAL AORTOGRAM W/LOWER EXTREMITY;  Surgeon: Waynetta Sandy, MD;  Location: [place] [facility];  Service: Cardiovascular;  Laterality: Left;   ABDOMINAL AORTOGRAM W/LOWER EXTREMITY Left  12/02/2020   Procedure: ABDOMINAL AORTOGRAM W/LOWER EXTREMITY;  Surgeon: Marty Heck, MD;  Location: [place] [facility];  Service: Cardiovascular;  Laterality: Left;   ABDOMINAL AORTOGRAM W/LOWER EXTREMITY N/A 12/22/2020   Procedure: ABDOMINAL AORTOGRAM W/LOWER EXTREMITY;  Surgeon: Broadus John, MD;  Location: [place] [facility];  Service: Cardiovascular;  Laterality: N/A;   AMPUTATION Left 02/11/2021   Procedure: LEFT ABOVE KNEE AMPUTATION;  Surgeon: Marty Heck, MD;  Location: [place];  Service: Vascular;  Laterality: Left;   BYPASS GRAFT POPLITEAL TO TIBIAL Left 12/31/2020   Procedure: LEFT JUMP GRAFT REVISION BELOW KNEE POPLITEAL TO PERONEAL;  Surgeon: Marty Heck, MD;  Location: [place];  Service: Vascular;  Laterality: Left;   EMBOLIZATION Left 12/03/2020   Procedure: EMBOLIZATION;  Surgeon: Cherre Robins, MD;  Location: [place] [facility];  Service: Cardiovascular;  Laterality: Left;   FEMORAL-POPLITEAL BYPASS GRAFT Left 12/14/2016   Procedure: BYPASS GRAFT COMMON FEMORAL- BELOW KNEE POPLITEAL ARTERY with Bovine Patch to Below the knee poplitieal artery.;  Surgeon: Conrad [place], MD;  Location: [place];  Service: Vascular;  Laterality: Left;   FEMORAL-POPLITEAL BYPASS GRAFT Left 01/28/2018   Procedure: BYPASS GRAFT FEMORAL-BELOW KNEE POPLITEAL ARTERY REDO with propaten vascular graft removable ring;  Surgeon: Marty Heck, MD;  Location: [place];  Service: Vascular;  Laterality: Left;   FEMUR SURGERY Right 2009   pt. has a rod in that leg   LOWER EXTREMITY ANGIOGRAPHY Left 11/28/2016   Procedure: Lower Extremity Angiography;  Surgeon: Einar Gip,  Ulice Dash, MD;  Location: [place] [facility];  Service: Cardiovascular;  Laterality: Left;  lysis followup lower leg   PATCH ANGIOPLASTY Left 01/28/2018   Procedure: PATCH ANGIOPLASTY USING Rueben Bash BIOLOGIC PATCH;  Surgeon: Marty Heck, MD;  Location: [place];  Service: Vascular;  Laterality: Left;   PATCH ANGIOPLASTY  Left 06/16/2020   Procedure: PATCH ANGIOPLASTY OF LEFT BELOW KNEE [place] ARTERY;  Surgeon: Marty Heck, MD;  Location: [place];  Service: Vascular;  Laterality: Left;   PERIPHERAL VASCULAR BALLOON ANGIOPLASTY  11/28/2016   Procedure: PERIPHERAL VASCULAR BALLOON ANGIOPLASTY;  Surgeon: Adrian Prows, MD;  Location: [place] [place] [facility];  Service: Cardiovascular;;  SFA   PERIPHERAL VASCULAR BALLOON ANGIOPLASTY  12/03/2020   Procedure: PERIPHERAL VASCULAR BALLOON ANGIOPLASTY;  Surgeon: Cherre Robins, MD;  Location: [facility];  Service: Cardiovascular;;  TP Trunk and Peroneal   PERIPHERAL VASCULAR CATHETERIZATION N/A 08/11/2014   Procedure: Lower Extremity Angiography;  Surgeon: Adrian Prows, MD;  Location: [facility] [facility];  Service: Cardiovascular;  Laterality: N/A;   PERIPHERAL VASCULAR INTERVENTION Left 09/22/2019   Procedure: PERIPHERAL VASCULAR INTERVENTION;  Surgeon: Waynetta Sandy, MD;  Location: [place] [facility];  Service: Cardiovascular;  Laterality: Left;   PERIPHERAL VASCULAR INTERVENTION Left 09/23/2019   Procedure: PERIPHERAL VASCULAR INTERVENTION;  Surgeon: Serafina Mitchell, MD;  Location: [place] [facility];  Service: Cardiovascular;  Laterality: Left;   PERIPHERAL VASCULAR INTERVENTION Left 05/13/2020   Procedure: PERIPHERAL VASCULAR INTERVENTION;  Surgeon: Marty Heck, MD;  Location: [place] [facility] [place] [facility];  Service: Cardiovascular;  Laterality: Left;   PERIPHERAL VASCULAR INTERVENTION Left 12/02/2020   Procedure: PERIPHERAL VASCULAR INTERVENTION;  Surgeon: Marty Heck, MD;  Location: [place] [facility];  Service: Cardiovascular;  Laterality: Left;   PERIPHERAL VASCULAR INTERVENTION  12/03/2020   Procedure: PERIPHERAL VASCULAR INTERVENTION;  Surgeon: Cherre Robins, MD;  Location: [place] [facility];  Service: Cardiovascular;;   PERIPHERAL VASCULAR INTERVENTION Left 12/22/2020   Procedure: PERIPHERAL VASCULAR INTERVENTION;  Surgeon: Broadus John, MD;  Location: [place] [facility];  Service: Cardiovascular;  Laterality: Left;   PERIPHERAL VASCULAR THROMBECTOMY  11/28/2016   Procedure: PERIPHERAL VASCULAR THROMBECTOMY;  Surgeon: Adrian Prows, MD;  Location: [place] [facility];  Service: Cardiovascular;;  [place]funda   PERIPHERAL VASCULAR THROMBECTOMY  11/28/2016   Procedure: PERIPHERAL VASCULAR THROMBECTOMY;  Surgeon: Adrian Prows, MD;  Location: [facility] [facility];  Service: Cardiovascular;;   PERIPHERAL VASCULAR THROMBECTOMY N/A 05/12/2020   Procedure: PERIPHERAL VASCULAR THROMBECTOMY-Left Leg;  Surgeon: Cherre Robins, MD;  Location: [place] [facility];  Service: Cardiovascular;  Laterality: N/A;   PERIPHERAL VASCULAR THROMBECTOMY N/A 05/13/2020   Procedure: PERIPHERAL VASCULAR THROMBECTOMY- LYSIS RECHECK;  Surgeon: Marty Heck, MD;  Location: [place] [facility];  Service: Cardiovascular;  Laterality: N/A;   PERIPHERAL VASCULAR THROMBECTOMY N/A 12/02/2020   Procedure: PERIPHERAL VASCULAR THROMBECTOMY;  Surgeon: Marty Heck, MD;  Location: [place] [facility];  Service: Cardiovascular;  Laterality: N/A;   PERIPHERAL VASCULAR THROMBECTOMY N/A 12/03/2020   Procedure: LYSIS RECHECK;  Surgeon: Cherre Robins, MD;  Location: [place] [facility];  Service: Cardiovascular;  Laterality: N/A;   PULMONARY THROMBECTOMY N/A 09/23/2019   Procedure: LYSIS RECHECK;  Surgeon: Serafina Mitchell, MD;  Location: [facility] [place] [facility];  Service: Cardiovascular;  Laterality: N/A;   THROMBECTOMY FEMORAL ARTERY Left 12/14/2016   Procedure: THROMBECTOMY PROFUNDA FEMORAL ARTERY;  Surgeon: Conrad [place], MD;  Location: [place];  Service: Vascular;  Laterality: Left;   THROMBECTOMY FEMORAL ARTERY Left 06/16/2020   Procedure: Redo exposure ot Left below knee popiteal artery;  Surgeon: Marty Heck, MD;  Location[facility];  Service: Vascular;  Laterality: Left;   THROMBECTOMY OF BYPASS GRAFT FEMORAL- POPLITEAL ARTERY Left 06/16/2020   Procedure: THROMBECTOMY  OF BYPASS GRAFT FEMORAL-POPLITEAL ARTERY and TIBIAL ARTERY;  Surgeon: Marty Heck, MD;  Location: [place];  Service: Vascular;  Laterality: Left;   THROMBECTOMY OF BYPASS GRAFT FEMORAL- POPLITEAL ARTERY Left 12/31/2020   Procedure: THROMBECTOMY OF LEFT COMMON FEMORAL TO BELOW KNEE POPLITEAL ARTERY BYPASS GRAFT;  Surgeon: Marty Heck, MD;  Location: [place];  Service: Vascular;  Laterality: Left;   VEIN HARVEST Left 12/31/2020   Procedure: Left Greater Saphenous Vein Harvest;  Surgeon: Marty Heck, MD;  Location: [place];  Service: Vascular;  Laterality: Left;   Patient Active Problem List   Diagnosis Date Noted   Peripheral artery disease ([place]) 12/31/2020   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[place]) 05/12/2020   Thrombosis of femoro-popliteal bypass graft ([place]) 05/12/2020   Critical lower limb ischemia ([place]) 12/11/2016   Claudication in peripheral vascular disease ([place]) 11/28/2016   HTN (hypertension) 10/09/2014   Tobacco use 10/09/2014   PAD (peripheral artery disease) ([place])     REFERRING DIAG: W10.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  PAIN:  Are you having pain?  Yes: NPRS scale: arrived to PT 0/10.  Pain location: distal residual limb Pain description: cramping when he wakes up, soreness walking & aches when sitting with prosthesis. Aggravating factors: walking with sudden increases in time Relieving factors: wearing prosthesis  OBJECTIVE:    POSTURE: 05/19/21:  rounded shoulders, forward head, flexed trunk , and weight shift  right   LE ROM:   ROM P:passive  A:active Right 05/19/2021 Left 05/19/2021 Left 08/11/2021  Hip flexion       Hip extension   -15* standing -5* standing  Hip abduction       Hip adduction       Hip internal rotation       Hip external rotation       Knee flexion       Knee extension       Ankle dorsiflexion       Ankle plantarflexion       Ankle inversion       Ankle eversion        (Blank rows = not tested) MMT:   MMT Right 05/19/2021 Left 05/19/2021 Left 08/11/21  Hip flexion       Hip extension   3/5 Gross functional 4/5 Gross functional  Hip abduction   3/5 Gross functional 4/5 Gross functional  Hip adduction       Hip internal rotation       Hip external rotation       Knee flexion       Knee extension       Ankle dorsiflexion       Ankle plantarflexion       Ankle inversion  Ankle eversion       (Blank rows = not tested)   TRANSFERS: Sit to stand: 05/19/21:  SBA and requires UE assist with armrests & RW touch to stabilize Stand to sit: 05/19/21:  SBA and requires UE assist with armrests & RW stabilization   GAIT: 08/10/2021:  Pt neg curb with RW & TFA prosthesis modified independent. Pt neg ramp with RW & TFA prosthesis with verbal cues on hand position on RW to descend. Pt neg stairs with single rail & cane safely modified independent.   08/09/2021: pt amb 516' in 9 min with RW & TFA prosthesis modified independent. Post gait HR 67 SpO2 97%  Gait velocity: 1.10 ft/sec  He had one misstep that he self corrected.    Pattern: step to with decreased right step length, left hip hike.  Good prosthetic foot clearance, minimal abduction intermittently noted.   05/19/21:  Gait pattern: step to pattern, decreased step length- Right, decreased stance time- Left, decreased hip/knee flexion- Left, circumduction- Left, Left hip hike, antalgic, trunk flexed, abducted- Left, and poor foot clearance- Left  Distance walked: 43' Assistive device utilized: Environmental consultant - 2  wheeled and TFA prosthesis  excessive UE weight bearing Level of assistance: SBA   FUNCTIONAL TESTs:  08/11/2021  TUG with cane stand alone tip:  PT had pt walk one TUG path with minA for warm-up.  Std TUG 95.03 sec with minA  Cog TUG 146.38 sec with minA with pt describing how to build doghouse, able to talk entire time including turns.  Manual TUG 137.65 sec carrying open bottle of water with no spills.  08/09/2021  Berg Balance 34/56 & tasks of Berg with cane support 46/56    Berg Balance Scale: 05/19/21:  17/56   [facility] PT Assessment - 05/19/21 0900                Standardized Balance Assessment    Standardized Balance Assessment Berg Balance Test          Berg Balance Test    Sit to Stand Needs minimal aid to stand or to stabilize     Standing Unsupported Able to stand 2 minutes with supervision     Sitting with Back Unsupported but Feet Supported on Floor or Stool Able to sit safely and securely 2 minutes     Stand to Sit Controls descent by using hands     Transfers Able to transfer safely, definite need of hands     Standing Unsupported with Eyes Closed Unable to keep eyes closed 3 seconds but stays steady     Standing Unsupported with Feet Together Needs help to attain position but able to stand for 30 seconds with feet together     From Standing, Reach Forward with Outstretched Arm Reaches forward but needs supervision     From Standing Position, Pick up Object from Floor Unable to try/needs assist to keep balance     From Standing Position, Turn to Look Behind Over each Shoulder Needs assist to keep from losing balance and falling     Turn 360 Degrees Needs assistance while turning     Standing Unsupported, Alternately Place Feet on Step/Stool Needs assistance to keep from falling or unable to try     Standing Unsupported, One Foot in [employer] balance while stepping or standing     Standing on One Leg Unable to try or needs assist to prevent fall     Total Score 17  Berg comment: BERG  < 36 high risk for falls (close to 100%) 46-51 moderate (>50%)   37-45 significant (>80%) 52-55 lower (> 25%)                   CURRENT PROSTHETIC WEAR ASSESSMENT: 05/19/21:  Patient is dependent with: skin check, residual limb care, care of non-amputated limb, prosthetic cleaning, ply sock cleaning, correct ply sock adjustment, proper wear schedule/adjustment, and proper weight-bearing schedule/adjustment [place] prosthesis: SBA [place] prosthesis: Modified independence Prosthetic wear tolerance: 5-6 hours/day, 16 of 16 days since delivery.  Prosthetic weight bearing tolerance: 5 minutes with partial weight on prosthesis & intermittent support with RW Residual limb condition: 2 invaginated areas with one not clean, 1 small area on incision that may be suture working way out, dry skin, cylinderical shape, nonpitting edema,  Prosthetic description: silicon liner with velcro lanyard, ischial containment socket with flexible inner liner, SAFETY knee (single axis nonhydraulic), K2 flexible keel foot    TODAY'S TREATMENT:  08/31/21 Prosthetic Training with Transfemoral Prosthesis: Walking around obstacles 5' apart with turns right & left carrying open bottle of water with cane with supervision and minA to recover one loss of balance Walking 30' with cane with supervision, PT demonstrate option for family member SBA when walking in household or to walk near furniture without holding it and pt verbalized understanding Walking around square with sidestepping & backwards with cane and carrying 2lb weighted ball with supervision Pt able to pick up 6" cone from floor with supervision, was independent with managing prosthesis and cane for technique Pt amb 70' including over 1" obstacle x2 reps and around equipment in gym with cane, verbal cueing for foot placement with obstacle. Working on gait around household furniture and stepping over thresholds requiring minA to step over cane on  floor to simulate threshold.   08/29/2021 Prosthetic Training with Transfemoral Prosthesis: Pt ambulated 50' X 1 with cane stand alone tip with turning 180* around obstacle 1st rep to right & 2nd rep to left with supervision, 1 loss of balance pt self-corrected and pt noted mild/moderate increase Rt lower leg pain with activity Walking around obstacles 5' apart with turns right & left carrying 2lb weighted ball with cane with supervision, verbal cueing for increased weight shift on prosthesis during stance phase Static standing balance no UE support x 30s no losses of balance Standing turning to look over bil. shoulder x 5 ea., verbal and visual cueing for weight shift and hip rotation Standing UE reach 5" anterior to lift cone off stack and return it x 6 reps bil, verbal cueing for weight shifting Pt amb 80' around equipment in gym and through doorway with cane while pt explaining construction of doghouse for distraction, 1 loss of balance pt self-corrected. Working on gait around household furniture/doorways with distraction.   08/24/2021 Prosthetic Training with Transfemoral Prosthesis: Pt ambulated 50' X 1 with cane stand alone tip with turning 180* around obstacle 1st rep to right & 2nd rep to left with supervision.  Walking around square with sidestepping & backwards with cane with supervision and minA to recover one loss of balance. Walking around obstacles 5' apart with turns right & left carrying open bottle of water with cane with supervision Pt able to pick up 6" cone from floor x 2 with supervision & verbal cues for positioning following minA to recover one loss of balance. Pt verbalized understanding of which hand is easier and technique with TFA prosthesis.  Pt amb 80' with cane  looking in all directions to identify items in environment with CGA, 1 loss of balance pt self-corrected. Pt had to stop to scan all directions for >90% of time.  PT reminder to wear prosthesis all day even  while sick, despite lower activity levels. PT instructed    Access Code: [email or web address] URL: [email or web address] Date: 07/04/2021 Prepared by: Jamey Reas  Exercises - Seated Hamstring Stretch with Strap  - 2 x daily - 7 x weekly - 1 sets - 2-3 reps - 30 seconds hold - Modified Mounce Stretch  - 2 x daily - 7 x weekly - 1 sets - 2 reps - 30 seconds hold - Supine Dynamic Modified Guinyard Quad and Hip Flexor Dynamic Stretch  - 2 x daily - 7 x weekly - 1 sets - 2 reps - 30 seconds hold - Standing posture with back to counter  - 2 x daily - 7 x weekly - 1 sets - 1 reps - 5-10 minutes hold - Upright Stance at Door Frame Single Arm  - 3-6 x daily - 7 x weekly - 1 sets - 2 reps - 2 deep breathes hold - Upright Stance at Door Frame with Both Arms  - 3-6 x daily - 7 x weekly - 1 sets - 2 reps - 2 deep breathes hold - feet together eyes open on floor  - 1 x daily - 4-7 x weekly - 1 sets - 10 reps - 2 seconds hold - Feet Apart with Eyes Closed with Head Motions  - 1 x daily - 4-7 x weekly - 1 sets - 10 reps - 2 seconds hold - Wide stance on Foam Pad head movements  - 1 x daily - 4-7 x weekly - 1 sets - 10 reps - 2 seconds hold - Standing Quarter Turn with Counter Support  - 1 x daily - 4-7 x weekly - 1 sets - 10 reps - Side Stepping with Counter Support  - 1 x daily - 5-7 x weekly - 1 sets - 10 reps - Backward Walking with Counter Support  - 1 x daily - 5-7 x weekly - 1 sets - 10 reps    05/24/21 1151  PT Education  Education Details HEP at sink & residual limb pain vs phantom sensation vs phantom pain  Person(s) Educated Patient  Methods Explanation;Demonstration;Tactile cues;Verbal cues;Handout  Comprehension Verbalized understanding;Returned demonstration;Verbal cues required;Tactile cues required;Need further instruction   ASSESSMENT:   CLINICAL IMPRESSION: He is continuing to progress with dynamic gait tasks to resemble household mobility requirements. He noted furniture walking  at home while using the cane, and was instructed to try walking with cane near furniture or near a family member as his confidence grows and need to assistance decreases. He requires occasional cueing for foot placement or assist in recovering losses of balance during dynamic gait tasks. He continues to benefit from skilled PT.   OBJECTIVE IMPAIRMENTS Abnormal gait, decreased activity tolerance, decreased balance, decreased endurance, decreased knowledge of condition, decreased knowledge of use of DME, decreased mobility, decreased ROM, decreased strength, impaired flexibility, postural dysfunction, prosthetic dependency , and pain.    ACTIVITY LIMITATIONS community activity and occupation.    PERSONAL FACTORS Fitness, Time since onset of injury/illness/exacerbation, and 3+ comorbidities: see PMH  are also affecting patient's functional outcome.    REHAB POTENTIAL: Good   CLINICAL DECISION MAKING: Evolving/moderate complexity   EVALUATION COMPLEXITY: Moderate     GOALS: Goals reviewed with patient? Yes   SHORT TERM GOALS: Target date: 09/08/2021  Patient verbalizes how to decrease residual limb pain. Goal status: ongoing 08/11/2021 2.  Patient tolerates prosthesis >90% awake hrs /day without skin issues or limb pain </= 2/10 >/= 75% of days after standing / walking. Goal status: 08/11/21 met for time & no skin issues but ongoing for limb pain.   3. Patient ambulates 100' with cane stand alone tip without hand hold assist & prosthesis with min guard. Goal status: ongoing 08/11/2021  LONG TERM GOALS: Target date: 03/04/9530 (reset at re-certification)   Patient demonstrates & verbalized understanding of prosthetic care including issues to address limb pain to enable safe utilization of prosthesis. Baseline: SEE OBJECTIVE DATA Goal status: ongoing 08/09/2021   Patient tolerates prosthesis wear >90% of awake hours without skin or limb pain issues. Baseline: SEE OBJECTIVE DATA Goal status:  partially met 08/09/2021   Merrilee Jansky Balance >/= 45/56 to indicate lower fall risk Baseline: SEE OBJECTIVE DATA Goal status: reset to higher level on 08/11/2021   Patient ambulates >500' with LRAD & prosthesis modified independent. Baseline: SEE OBJECTIVE DATA Goal status: met 08/09/2021 with RW   Patient negotiates ramps, curbs & stairs with single rail with LRAD & prosthesis modified independent. Baseline: SEE OBJECTIVE DATA Goal status: met 08/11/2021 with RW  6.  Patient ambulates 100' with cane & prosthesis modified independent.  Baseline: SEE OBJECTIVE DATA Goal status: NEW 08/11/2021  7. Timed Up & Go with cane stand alone tip modified independent:  Std TUG <60sec, Cog TUG <45mn, Manual TUG <2 min Baseline: SEE OBJECTIVE DATA Goal status: NEW 08/11/2021   PLAN: PT FREQUENCY: 2x/week   PT DURATION: 8 weeks   PLANNED INTERVENTIONS: Therapeutic exercises, Therapeutic activity, Neuromuscular re-education, Balance training, Gait training, Patient/Family education, Stair training, Vestibular training, Prosthetic training, and DME instructions, Physical Performance Testing   PLAN FOR NEXT SESSION: check updated STGs, static balance requiring weight shift and WB through prosthesis, household gait with cane stand alone tip including cognitive load and changes of direction  [employer] Student-PT 08/31/2021, 11:05 AM   This entire session of physical therapy was performed under the direct supervision of PT signing evaluation /treatment. PT reviewed note and agrees.  RJamey Reas PT, DPT 08/31/2021, 11:07 AM

## 2021-08-31 ENCOUNTER — Encounter: Payer: Self-pay | Admitting: Physical Therapy

## 2021-08-31 ENCOUNTER — Ambulatory Visit (INDEPENDENT_AMBULATORY_CARE_PROVIDER_SITE_OTHER): Payer: Medicare Other | Admitting: Physical Therapy

## 2021-08-31 DIAGNOSIS — M79605 Pain in left leg: Secondary | ICD-10-CM

## 2021-08-31 DIAGNOSIS — R293 Abnormal posture: Secondary | ICD-10-CM | POA: Diagnosis not present

## 2021-08-31 DIAGNOSIS — G546 Phantom limb syndrome with pain: Secondary | ICD-10-CM

## 2021-08-31 DIAGNOSIS — R2689 Other abnormalities of gait and mobility: Secondary | ICD-10-CM | POA: Diagnosis not present

## 2021-08-31 DIAGNOSIS — M6281 Muscle weakness (generalized): Secondary | ICD-10-CM

## 2021-08-31 DIAGNOSIS — R2681 Unsteadiness on feet: Secondary | ICD-10-CM

## 2021-09-05 ENCOUNTER — Ambulatory Visit (INDEPENDENT_AMBULATORY_CARE_PROVIDER_SITE_OTHER): Payer: Medicare Other | Admitting: Physical Therapy

## 2021-09-05 ENCOUNTER — Encounter: Payer: Self-pay | Admitting: Physical Therapy

## 2021-09-05 DIAGNOSIS — M79605 Pain in left leg: Secondary | ICD-10-CM | POA: Diagnosis not present

## 2021-09-05 DIAGNOSIS — R293 Abnormal posture: Secondary | ICD-10-CM

## 2021-09-05 DIAGNOSIS — R2681 Unsteadiness on feet: Secondary | ICD-10-CM

## 2021-09-05 DIAGNOSIS — G546 Phantom limb syndrome with pain: Secondary | ICD-10-CM

## 2021-09-05 DIAGNOSIS — R2689 Other abnormalities of gait and mobility: Secondary | ICD-10-CM | POA: Diagnosis not present

## 2021-09-05 DIAGNOSIS — M6281 Muscle weakness (generalized): Secondary | ICD-10-CM

## 2021-09-05 NOTE — Therapy (Signed)
OUTPATIENT PHYSICAL THERAPY PROSTHETIC TREATMENT NOTE  Patient Name: Ryan [name] MRN: [id number] DOB:[birth year], 71 y.o., male Today's Date: 09/05/2021  PCP: Iona Beard, MD REFERRING PROVIDER: Karoline Caldwell, PA-C  End of Session:   PT End of Session - 09/05/21 0927     Visit Number 32    Number of Visits 41    Date for PT Re-Evaluation 10/06/21    Authorization Type Medicare A/B & Generic commercial    Progress Note Due on Visit 35    PT Start Time 0929    PT Stop Time 1014    PT Time Calculation (min) 45 min    Equipment Utilized During Treatment Gait belt    Activity Tolerance Patient tolerated treatment well    Behavior During Therapy WFL for tasks assessed/performed              Past Medical History:  Diagnosis Date   COVID 2022   Hypertension    PVD (peripheral vascular disease) ([place])    Past Surgical History:  Procedure Laterality Date   ABDOMINAL AORTAGRAM Left 12/13/2016   ABDOMINAL AORTOGRAM W/LOWER EXTREMITY/notes 12/13/2016   ABDOMINAL AORTOGRAM W/LOWER EXTREMITY N/A 11/28/2016   Procedure: ABDOMINAL AORTOGRAM W/LOWER EXTREMITY;  Surgeon: Adrian Prows, MD;  Location: [facility];  Service: Cardiovascular;  Laterality: N/A;  bilateral   ABDOMINAL AORTOGRAM W/LOWER EXTREMITY N/A 12/13/2016   Procedure: ABDOMINAL AORTOGRAM W/LOWER EXTREMITY;  Surgeon: Waynetta Sandy, MD;  Location: [facility];  Service: Cardiovascular;  Laterality: N/A;  unilater left leg   ABDOMINAL AORTOGRAM W/LOWER EXTREMITY N/A 01/09/2018   Procedure: ABDOMINAL AORTOGRAM W/LOWER EXTREMITY;  Surgeon: Marty Heck, MD;  Location: [facility];  Service: Cardiovascular;  Laterality: N/A;   ABDOMINAL AORTOGRAM W/LOWER EXTREMITY Left 09/22/2019   Procedure: ABDOMINAL AORTOGRAM W/LOWER EXTREMITY;  Surgeon: Waynetta Sandy, MD;  Location: [place] [facility];  Service: Cardiovascular;  Laterality: Left;   ABDOMINAL AORTOGRAM W/LOWER EXTREMITY Left  12/02/2020   Procedure: ABDOMINAL AORTOGRAM W/LOWER EXTREMITY;  Surgeon: Marty Heck, MD;  Location: [facility] [facility];  Service: Cardiovascular;  Laterality: Left;   ABDOMINAL AORTOGRAM W/LOWER EXTREMITY N/A 12/22/2020   Procedure: ABDOMINAL AORTOGRAM W/LOWER EXTREMITY;  Surgeon: Broadus John, MD;  Location: [facility];  Service: Cardiovascular;  Laterality: N/A;   AMPUTATION Left 02/11/2021   Procedure: LEFT ABOVE KNEE AMPUTATION;  Surgeon: Marty Heck, MD;  Location: [place];  Service: Vascular;  Laterality: Left;   BYPASS GRAFT POPLITEAL TO TIBIAL Left 12/31/2020   Procedure: LEFT JUMP GRAFT REVISION BELOW KNEE POPLITEAL TO PERONEAL;  Surgeon: Marty Heck, MD;  Location: [place];  Service: Vascular;  Laterality: Left;   EMBOLIZATION Left 12/03/2020   Procedure: EMBOLIZATION;  Surgeon: Cherre Robins, MD;  Location: [facility];  Service: Cardiovascular;  Laterality: Left;   FEMORAL-POPLITEAL BYPASS GRAFT Left 12/14/2016   Procedure: BYPASS GRAFT COMMON FEMORAL- BELOW KNEE POPLITEAL ARTERY with Bovine Patch to Below the knee poplitieal artery.;  Surgeon: Conrad [place], MD;  Location: [place];  Service: Vascular;  Laterality: Left;   FEMORAL-POPLITEAL BYPASS GRAFT Left 01/28/2018   Procedure: BYPASS GRAFT FEMORAL-BELOW KNEE POPLITEAL ARTERY REDO with propaten vascular graft removable ring;  Surgeon: Marty Heck, MD;  Location: [place];  Service: Vascular;  Laterality: Left;   FEMUR SURGERY Right 2009   pt. has a rod in that leg   LOWER EXTREMITY ANGIOGRAPHY Left 11/28/2016   Procedure: Lower Extremity Angiography;  Surgeon:  Adrian Prows, MD;  Location: [facility];  Service: Cardiovascular;  Laterality: Left;  lysis followup lower leg   PATCH ANGIOPLASTY Left 01/28/2018   Procedure: PATCH ANGIOPLASTY USING Rueben Bash BIOLOGIC PATCH;  Surgeon: Marty Heck, MD;  Location: [place];  Service: Vascular;  Laterality: Left;   PATCH ANGIOPLASTY  Left 06/16/2020   Procedure: PATCH ANGIOPLASTY OF LEFT BELOW KNEE [place] ARTERY;  Surgeon: Marty Heck, MD;  Location: [place];  Service: Vascular;  Laterality: Left;   PERIPHERAL VASCULAR BALLOON ANGIOPLASTY  11/28/2016   Procedure: PERIPHERAL VASCULAR BALLOON ANGIOPLASTY;  Surgeon: Adrian Prows, MD;  Location: [facility];  Service: Cardiovascular;;  SFA   PERIPHERAL VASCULAR BALLOON ANGIOPLASTY  12/03/2020   Procedure: PERIPHERAL VASCULAR BALLOON ANGIOPLASTY;  Surgeon: Cherre Robins, MD;  Location: [facility];  Service: Cardiovascular;;  TP Trunk and Peroneal   PERIPHERAL VASCULAR CATHETERIZATION N/A 08/11/2014   Procedure: Lower Extremity Angiography;  Surgeon: Adrian Prows, MD;  Location: [facility];  Service: Cardiovascular;  Laterality: N/A;   PERIPHERAL VASCULAR INTERVENTION Left 09/22/2019   Procedure: PERIPHERAL VASCULAR INTERVENTION;  Surgeon: Waynetta Sandy, MD;  Location: [facility];  Service: Cardiovascular;  Laterality: Left;   PERIPHERAL VASCULAR INTERVENTION Left 09/23/2019   Procedure: PERIPHERAL VASCULAR INTERVENTION;  Surgeon: Serafina Mitchell, MD;  Location: [facility];  Service: Cardiovascular;  Laterality: Left;   PERIPHERAL VASCULAR INTERVENTION Left 05/13/2020   Procedure: PERIPHERAL VASCULAR INTERVENTION;  Surgeon: Marty Heck, MD;  Location: [facility];  Service: Cardiovascular;  Laterality: Left;   PERIPHERAL VASCULAR INTERVENTION Left 12/02/2020   Procedure: PERIPHERAL VASCULAR INTERVENTION;  Surgeon: Marty Heck, MD;  Location: [facility];  Service: Cardiovascular;  Laterality: Left;   PERIPHERAL VASCULAR INTERVENTION  12/03/2020   Procedure: PERIPHERAL VASCULAR INTERVENTION;  Surgeon: Cherre Robins, MD;  Location: [facility];  Service: Cardiovascular;;   PERIPHERAL VASCULAR INTERVENTION Left 12/22/2020   Procedure: PERIPHERAL VASCULAR INTERVENTION;  Surgeon: Broadus John, MD;  Location: [facility];  Service: Cardiovascular;  Laterality: Left;   PERIPHERAL VASCULAR THROMBECTOMY  11/28/2016   Procedure: PERIPHERAL VASCULAR THROMBECTOMY;  Surgeon: Adrian Prows, MD;  Location: [facility];  Service: Cardiovascular;;  Profunda   PERIPHERAL VASCULAR THROMBECTOMY  11/28/2016   Procedure: PERIPHERAL VASCULAR THROMBECTOMY;  Surgeon: Adrian Prows, MD;  Location: [facility];  Service: Cardiovascular;;   PERIPHERAL VASCULAR THROMBECTOMY N/A 05/12/2020   Procedure: PERIPHERAL VASCULAR THROMBECTOMY-Left Leg;  Surgeon: Cherre Robins, MD;  Location: [facility];  Service: Cardiovascular;  Laterality: N/A;   PERIPHERAL VASCULAR THROMBECTOMY N/A 05/13/2020   Procedure: PERIPHERAL VASCULAR THROMBECTOMY- LYSIS RECHECK;  Surgeon: Marty Heck, MD;  Location: [facility];  Service: Cardiovascular;  Laterality: N/A;   PERIPHERAL VASCULAR THROMBECTOMY N/A 12/02/2020   Procedure: PERIPHERAL VASCULAR THROMBECTOMY;  Surgeon: Marty Heck, MD;  Location: [facility];  Service: Cardiovascular;  Laterality: N/A;   PERIPHERAL VASCULAR THROMBECTOMY N/A 12/03/2020   Procedure: LYSIS RECHECK;  Surgeon: Cherre Robins, MD;  Location: [facility];  Service: Cardiovascular;  Laterality: N/A;   PULMONARY THROMBECTOMY N/A 09/23/2019   Procedure: LYSIS RECHECK;  Surgeon: Serafina Mitchell, MD;  Location: [facility];  Service: Cardiovascular;  Laterality: N/A;   THROMBECTOMY FEMORAL ARTERY Left 12/14/2016   Procedure: THROMBECTOMY PROFUNDA FEMORAL ARTERY;  Surgeon: Conrad [place], MD;  Location: [place];  Service: Vascular;  Laterality: Left;   THROMBECTOMY FEMORAL ARTERY Left 06/16/2020   Procedure: Redo exposure ot Left below knee popiteal artery;  Surgeon: Marty Heck, MD;  Location: [facility];  Service: Vascular;  Laterality: Left;   THROMBECTOMY OF BYPASS GRAFT FEMORAL- POPLITEAL ARTERY Left 06/16/2020   Procedure: THROMBECTOMY  OF BYPASS GRAFT FEMORAL-POPLITEAL ARTERY and TIBIAL ARTERY;  Surgeon: Marty Heck, MD;  Location: [place];  Service: Vascular;  Laterality: Left;   THROMBECTOMY OF BYPASS GRAFT FEMORAL- POPLITEAL ARTERY Left 12/31/2020   Procedure: THROMBECTOMY OF LEFT COMMON FEMORAL TO BELOW KNEE POPLITEAL ARTERY BYPASS GRAFT;  Surgeon: Marty Heck, MD;  Location: [place];  Service: Vascular;  Laterality: Left;   VEIN HARVEST Left 12/31/2020   Procedure: Left Greater Saphenous Vein Harvest;  Surgeon: Marty Heck, MD;  Location: [place];  Service: Vascular;  Laterality: Left;   Patient Active Problem List   Diagnosis Date Noted   Peripheral artery disease ([place]) 12/31/2020   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[place]) 05/12/2020   Thrombosis of femoro-popliteal bypass graft ([place]) 05/12/2020   Critical lower limb ischemia ([place]) 12/11/2016   Claudication in peripheral vascular disease ([place]) 11/28/2016   HTN (hypertension) 10/09/2014   Tobacco use 10/09/2014   PAD (peripheral artery disease) ([place])     REFERRING DIAG: 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  PAIN:  Are you having pain?  Yes: NPRS scale: arrived to PT 0/10.  Pain location: distal residual limb Pain description: cramping when he wakes up, soreness walking & aches when sitting with prosthesis. Aggravating factors: walking with sudden increases in time Relieving factors: wearing prosthesis  OBJECTIVE:    POSTURE: 05/19/21:  rounded shoulders, forward head, flexed trunk , and weight shift right   LE ROM:   ROM P:passive   A:active Right 05/19/2021 Left 05/19/2021 Left 08/11/2021  Hip flexion       Hip extension   -15* standing -5* standing  Hip abduction       Hip adduction       Hip internal rotation       Hip external rotation       Knee flexion       Knee extension       Ankle dorsiflexion       Ankle plantarflexion       Ankle inversion       Ankle eversion        (Blank rows = not tested) MMT:   MMT Right 05/19/2021 Left 05/19/2021 Left 08/11/21  Hip flexion       Hip extension   3/5 Gross functional 4/5 Gross functional  Hip abduction   3/5 Gross functional 4/5 Gross functional  Hip adduction       Hip internal rotation       Hip external rotation       Knee flexion       Knee extension       Ankle dorsiflexion       Ankle plantarflexion       Ankle inversion       Ankle eversion       (  Blank rows = not tested)   TRANSFERS: Sit to stand: 05/19/21:  SBA and requires UE assist with armrests & RW touch to stabilize Stand to sit: 05/19/21:  SBA and requires UE assist with armrests & RW stabilization   GAIT: 08/10/2021:  Pt neg curb with RW & TFA prosthesis modified independent. Pt neg ramp with RW & TFA prosthesis with verbal cues on hand position on RW to descend. Pt neg stairs with single rail & cane safely modified independent.   08/09/2021: pt amb 516' in 9 min with RW & TFA prosthesis modified independent. Post gait HR 67 SpO2 97%  Gait velocity: 1.10 ft/sec  He had one misstep that he self corrected.    Pattern: step to with decreased right step length, left hip hike.  Good prosthetic foot clearance, minimal abduction intermittently noted.   05/19/21:  Gait pattern: step to pattern, decreased step length- Right, decreased stance time- Left, decreased hip/knee flexion- Left, circumduction- Left, Left hip hike, antalgic, trunk flexed, abducted- Left, and poor foot clearance- Left  Distance walked: 76' Assistive device utilized: Environmental consultant - 2 wheeled and TFA prosthesis  excessive UE  weight bearing Level of assistance: SBA   FUNCTIONAL TESTs:  08/11/2021  TUG with cane stand alone tip:  PT had pt walk one TUG path with minA for warm-up.  Std TUG 95.03 sec with minA  Cog TUG 146.38 sec with minA with pt describing how to build doghouse, able to talk entire time including turns.  Manual TUG 137.65 sec carrying open bottle of water with no spills.  08/09/2021  Berg Balance 34/56 & tasks of Berg with cane support 46/56    Berg Balance Scale: 05/19/21:  17/56   [facility] PT Assessment - 05/19/21 0900                Standardized Balance Assessment    Standardized Balance Assessment Berg Balance Test          Berg Balance Test    Sit to Stand Needs minimal aid to stand or to stabilize     Standing Unsupported Able to stand 2 minutes with supervision     Sitting with Back Unsupported but Feet Supported on Floor or Stool Able to sit safely and securely 2 minutes     Stand to Sit Controls descent by using hands     Transfers Able to transfer safely, definite need of hands     Standing Unsupported with Eyes Closed Unable to keep eyes closed 3 seconds but stays steady     Standing Unsupported with Feet Together Needs help to attain position but able to stand for 30 seconds with feet together     From Standing, Reach Forward with Outstretched Arm Reaches forward but needs supervision     From Standing Position, Pick up Object from Floor Unable to try/needs assist to keep balance     From Standing Position, Turn to Look Behind Over each Shoulder Needs assist to keep from losing balance and falling     Turn 360 Degrees Needs assistance while turning     Standing Unsupported, Alternately Place Feet on Step/Stool Needs assistance to keep from falling or unable to try     Standing Unsupported, One Foot in [employer] balance while stepping or standing     Standing on One Leg Unable to try or needs assist to prevent fall     Total Score 17     Berg comment: BERG  < 36 high risk for  falls (close to 100%) 46-51 moderate (>50%)   37-45 significant (>80%) 52-55 lower (> 25%)                   CURRENT PROSTHETIC WEAR ASSESSMENT: 05/19/21:  Patient is dependent with: skin check, residual limb care, care of non-amputated limb, prosthetic cleaning, ply sock cleaning, correct ply sock adjustment, proper wear schedule/adjustment, and proper weight-bearing schedule/adjustment Donning prosthesis: SBA Doffing prosthesis: Modified independence Prosthetic wear tolerance: 5-6 hours/day, 16 of 16 days since delivery.  Prosthetic weight bearing tolerance: 5 minutes with partial weight on prosthesis & intermittent support with RW Residual limb condition: 2 invaginated areas with one not clean, 1 small area on incision that may be suture working way out, dry skin, cylinderical shape, nonpitting edema,  Prosthetic description: silicon liner with velcro lanyard, ischial containment socket with flexible inner liner, SAFETY knee (single axis nonhydraulic), K2 flexible keel foot    TODAY'S TREATMENT:  09/02/21 Prosthetic Training with Transfemoral Prosthesis: Walking around obstacles 5' apart with turns right & left carrying 2lb weight with cane with supervision, pt recovered one minor loss of balance Walking around square with sidestepping & backwards with cane and carrying 2lb weighted ball with supervision, verbal cueing for foot placement Pt ambulate 35' x 2 with cane and conversation, working on gait with distraction. Ascending and descending 2 steps on ladder with CGA and verbal cueing. PT demo and cueing for step sequence, foot and knee placement, prosthesis placement. Pt verbalized understanding.   Therapeutic Activity Standing turning to look over bil. shoulder x 5 ea., verbal and visual cueing for weight shift Standing UE reach overhead to cabinet with bottle of water x 1 ea. UE, 2lb weight x 2 ea. UE, 8lb weight x 4 with BUE, verbal cueing for prosthesis placement and weight shift.  Working on postural stability with household chores, pt appears safe to do so with no losses of balance. Standing single UE holding 3lb bar against cabinet overhead, used both UEs. 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  08/31/21 Prosthetic Training with Transfemoral Prosthesis: Walking around obstacles 5' apart with turns right & left carrying open bottle of water with cane with supervision and minA to recover one loss of balance Walking 30' with cane with supervision, PT demonstrate option for family member SBA when walking in household or to walk near furniture without holding it and pt verbalized understanding Walking around square with sidestepping & backwards with cane and carrying 2lb weighted ball with supervision Pt able to pick up 6" cone from floor with supervision, was independent with managing prosthesis and cane for technique Pt amb 70' including over 1" obstacle x2 reps and around equipment in gym with cane, verbal cueing for foot placement with obstacle. Working on gait around household furniture and stepping over thresholds requiring minA to step over cane on floor to simulate threshold.   08/29/2021 Prosthetic Training with Transfemoral Prosthesis: Pt ambulated 50' X 1 with cane stand alone tip with turning 180* around obstacle 1st rep to right & 2nd rep to left with supervision, 1 loss of balance pt self-corrected and pt noted mild/moderate increase Rt lower leg pain with activity Walking around obstacles 5' apart with turns right & left carrying 2lb weighted ball with cane with supervision, verbal cueing for increased weight shift on prosthesis during stance phase Static standing balance no UE support x 30s no losses of balance Standing turning to look over bil. shoulder x 5 ea., verbal and visual  cueing for weight shift and hip rotation Standing UE reach 5" anterior to lift cone off stack and return it x 6 reps bil, verbal cueing for weight  shifting Pt amb 80' around equipment in gym and through doorway with cane while pt explaining construction of doghouse for distraction, 1 loss of balance pt self-corrected. Working on gait around household furniture/doorways with distraction.    Access Code: [email or web address] URL: [email or web address] Date: 07/04/2021 Prepared by: Vladimir Faster  Exercises - Seated Hamstring Stretch with Strap  - 2 x daily - 7 x weekly - 1 sets - 2-3 reps - 30 seconds hold - Modified Prigmore Stretch  - 2 x daily - 7 x weekly - 1 sets - 2 reps - 30 seconds hold - Supine Dynamic Modified Ulatowski Quad and Hip Flexor Dynamic Stretch  - 2 x daily - 7 x weekly - 1 sets - 2 reps - 30 seconds hold - Standing posture with back to counter  - 2 x daily - 7 x weekly - 1 sets - 1 reps - 5-10 minutes hold - Upright Stance at Door Frame Single Arm  - 3-6 x daily - 7 x weekly - 1 sets - 2 reps - 2 deep breathes hold - Upright Stance at Door Frame with Both Arms  - 3-6 x daily - 7 x weekly - 1 sets - 2 reps - 2 deep breathes hold - feet together eyes open on floor  - 1 x daily - 4-7 x weekly - 1 sets - 10 reps - 2 seconds hold - Feet Apart with Eyes Closed with Head Motions  - 1 x daily - 4-7 x weekly - 1 sets - 10 reps - 2 seconds hold - Wide stance on Foam Pad head movements  - 1 x daily - 4-7 x weekly - 1 sets - 10 reps - 2 seconds hold - Standing Quarter Turn with Counter Support  - 1 x daily - 4-7 x weekly - 1 sets - 10 reps - Side Stepping with Counter Support  - 1 x daily - 5-7 x weekly - 1 sets - 10 reps - Backward Walking with Counter Support  - 1 x daily - 5-7 x weekly - 1 sets - 10 reps    05/24/21 1151  PT Education  Education Details HEP at sink & residual limb pain vs phantom sensation vs phantom pain  Person(s) Educated Patient  Methods Explanation;Demonstration;Tactile cues;Verbal cues;Handout  Comprehension Verbalized understanding;Returned demonstration;Verbal cues required;Tactile cues required;Need  further instruction   ASSESSMENT:  CLINICAL IMPRESSION: He is continuing to progress with dynamic gait tasks, with one instance of self-recovered loss of balance today. Activities today resembled overhead household chores and overhead construction work, and he appears safe to do overhead activities at home with management of prosthesis. He was provided new education on ladder negotiation with a prosthesis to allow for occupational function, and required CGA to ascend and descend the ladder. He continues to benefit from skilled PT.   OBJECTIVE IMPAIRMENTS Abnormal gait, decreased activity tolerance, decreased balance, decreased endurance, decreased knowledge of condition, decreased knowledge of use of DME, decreased mobility, decreased ROM, decreased strength, impaired flexibility, postural dysfunction, prosthetic dependency , and pain.    ACTIVITY LIMITATIONS community activity and occupation.    PERSONAL FACTORS Fitness, Time since onset of injury/illness/exacerbation, and 3+ comorbidities: see PMH  are also affecting patient's functional outcome.    REHAB POTENTIAL: Good   CLINICAL DECISION MAKING: Evolving/moderate complexity   EVALUATION COMPLEXITY: Moderate  GOALS: Goals reviewed with patient? Yes   SHORT TERM GOALS: Target date: 09/08/2021   Patient verbalizes how to decrease residual limb pain. Goal status: ongoing 08/11/2021 2.  Patient tolerates prosthesis >90% awake hrs /day without skin issues or limb pain </= 2/10 >/= 75% of days after standing / walking. Goal status: 08/11/21 met for time & no skin issues but ongoing for limb pain.   3. Patient ambulates 100' with cane stand alone tip without hand hold assist & prosthesis with min guard. Goal status: ongoing 08/11/2021  LONG TERM GOALS: Target date: 10/04/3274 (reset at re-certification)   Patient demonstrates & verbalized understanding of prosthetic care including issues to address limb pain to enable safe utilization of  prosthesis. Baseline: SEE OBJECTIVE DATA Goal status: ongoing 08/09/2021   Patient tolerates prosthesis wear >90% of awake hours without skin or limb pain issues. Baseline: SEE OBJECTIVE DATA Goal status: partially met 08/09/2021   Merrilee Jansky Balance >/= 45/56 to indicate lower fall risk Baseline: SEE OBJECTIVE DATA Goal status: reset to higher level on 08/11/2021   Patient ambulates >500' with LRAD & prosthesis modified independent. Baseline: SEE OBJECTIVE DATA Goal status: met 08/09/2021 with RW   Patient negotiates ramps, curbs & stairs with single rail with LRAD & prosthesis modified independent. Baseline: SEE OBJECTIVE DATA Goal status: met 08/11/2021 with RW  6.  Patient ambulates 100' with cane & prosthesis modified independent.  Baseline: SEE OBJECTIVE DATA Goal status: NEW 08/11/2021  7. Timed Up & Go with cane stand alone tip modified independent:  Std TUG <60sec, Cog TUG <33min, Manual TUG <2 min Baseline: SEE OBJECTIVE DATA Goal status: NEW 08/11/2021   PLAN: PT FREQUENCY: 2x/week   PT DURATION: 8 weeks   PLANNED INTERVENTIONS: Therapeutic exercises, Therapeutic activity, Neuromuscular re-education, Balance training, Gait training, Patient/Family education, Stair training, Vestibular training, Prosthetic training, and DME instructions, Physical Performance Testing   PLAN FOR NEXT SESSION: ask about cane use at home without furniture walking, assess updated STGs, static balance during standing banded resistance exercise for strengthening and stability during construction tasks, household gait with cane stand alone tip including cognitive load and changes of direction  [employer], Student-PT 09/05/2021, 10:46 AM   This entire session of physical therapy was performed under the direct supervision of PT signing evaluation /treatment. PT reviewed note and agrees.  Jamey Reas, PT, DPT 09/05/2021, 11:07 AM

## 2021-09-07 ENCOUNTER — Encounter: Payer: Self-pay | Admitting: Physical Therapy

## 2021-09-07 ENCOUNTER — Ambulatory Visit (INDEPENDENT_AMBULATORY_CARE_PROVIDER_SITE_OTHER): Payer: Medicare Other | Admitting: Physical Therapy

## 2021-09-07 DIAGNOSIS — R2681 Unsteadiness on feet: Secondary | ICD-10-CM

## 2021-09-07 DIAGNOSIS — R2689 Other abnormalities of gait and mobility: Secondary | ICD-10-CM

## 2021-09-07 DIAGNOSIS — M79605 Pain in left leg: Secondary | ICD-10-CM

## 2021-09-07 DIAGNOSIS — M6281 Muscle weakness (generalized): Secondary | ICD-10-CM | POA: Diagnosis not present

## 2021-09-07 DIAGNOSIS — R293 Abnormal posture: Secondary | ICD-10-CM

## 2021-09-07 DIAGNOSIS — G546 Phantom limb syndrome with pain: Secondary | ICD-10-CM

## 2021-09-07 NOTE — Therapy (Signed)
OUTPATIENT PHYSICAL THERAPY PROSTHETIC TREATMENT NOTE  Patient Name: Ryan [name] MRN: [id number] DOB:[birth year], 71 y.o., male Today's Date: 09/07/2021  PCP: Iona Beard, MD REFERRING PROVIDER: Karoline Caldwell, PA-C  End of Session:   PT End of Session - 09/07/21 0932     Visit Number 33    Number of Visits 41    Date for PT Re-Evaluation 10/06/21    Authorization Type Medicare A/B & Generic commercial    Progress Note Due on Visit 35    PT Start Time 0930    PT Stop Time 1016    PT Time Calculation (min) 46 min    Equipment Utilized During Treatment Gait belt    Activity Tolerance Patient tolerated treatment well    Behavior During Therapy WFL for tasks assessed/performed              Past Medical History:  Diagnosis Date   COVID 2022   Hypertension    PVD (peripheral vascular disease) ([place])    Past Surgical History:  Procedure Laterality Date   ABDOMINAL AORTAGRAM Left 12/13/2016   ABDOMINAL AORTOGRAM W/LOWER EXTREMITY/notes 12/13/2016   ABDOMINAL AORTOGRAM W/LOWER EXTREMITY N/A 11/28/2016   Procedure: ABDOMINAL AORTOGRAM W/LOWER EXTREMITY;  Surgeon: Adrian Prows, MD;  Location: [facility];  Service: Cardiovascular;  Laterality: N/A;  bilateral   ABDOMINAL AORTOGRAM W/LOWER EXTREMITY N/A 12/13/2016   Procedure: ABDOMINAL AORTOGRAM W/LOWER EXTREMITY;  Surgeon: Waynetta Sandy, MD;  Location: [facility];  Service: Cardiovascular;  Laterality: N/A;  unilater left leg   ABDOMINAL AORTOGRAM W/LOWER EXTREMITY N/A 01/09/2018   Procedure: ABDOMINAL AORTOGRAM W/LOWER EXTREMITY;  Surgeon: Marty Heck, MD;  Location: [facility];  Service: Cardiovascular;  Laterality: N/A;   ABDOMINAL AORTOGRAM W/LOWER EXTREMITY Left 09/22/2019   Procedure: ABDOMINAL AORTOGRAM W/LOWER EXTREMITY;  Surgeon: Waynetta Sandy, MD;  Location: [facility];  Service: Cardiovascular;  Laterality: Left;   ABDOMINAL AORTOGRAM W/LOWER EXTREMITY Left  12/02/2020   Procedure: ABDOMINAL AORTOGRAM W/LOWER EXTREMITY;  Surgeon: Marty Heck, MD;  Location: [facility];  Service: Cardiovascular;  Laterality: Left;   ABDOMINAL AORTOGRAM W/LOWER EXTREMITY N/A 12/22/2020   Procedure: ABDOMINAL AORTOGRAM W/LOWER EXTREMITY;  Surgeon: Broadus John, MD;  Location: [facility];  Service: Cardiovascular;  Laterality: N/A;   AMPUTATION Left 02/11/2021   Procedure: LEFT ABOVE KNEE AMPUTATION;  Surgeon: Marty Heck, MD;  Location: [facility];  Service: Vascular;  Laterality: Left;   BYPASS GRAFT POPLITEAL TO TIBIAL Left 12/31/2020   Procedure: LEFT JUMP GRAFT REVISION BELOW KNEE POPLITEAL TO PERONEAL;  Surgeon: Marty Heck, MD;  Location: [facility];  Service: Vascular;  Laterality: Left;   EMBOLIZATION Left 12/03/2020   Procedure: EMBOLIZATION;  Surgeon: Cherre Robins, MD;  Location: [facility];  Service: Cardiovascular;  Laterality: Left;   FEMORAL-POPLITEAL BYPASS GRAFT Left 12/14/2016   Procedure: BYPASS GRAFT COMMON FEMORAL- BELOW KNEE POPLITEAL ARTERY with Bovine Patch to Below the knee poplitieal artery.;  Surgeon: Conrad [place], MD;  Location: [facility];  Service: Vascular;  Laterality: Left;   FEMORAL-POPLITEAL BYPASS GRAFT Left 01/28/2018   Procedure: BYPASS GRAFT FEMORAL-BELOW KNEE POPLITEAL ARTERY REDO with propaten vascular graft removable ring;  Surgeon: Marty Heck, MD;  Location: [facility];  Service: Vascular;  Laterality: Left;   FEMUR SURGERY Right 2009   pt. has a rod in that leg   LOWER EXTREMITY ANGIOGRAPHY Left 11/28/2016   Procedure: Lower Extremity Angiography;  Surgeon:  Adrian Prows, MD;  Location: [facility];  Service: Cardiovascular;  Laterality: Left;  lysis followup lower leg   PATCH ANGIOPLASTY Left 01/28/2018   Procedure: PATCH ANGIOPLASTY USING Rueben Bash BIOLOGIC PATCH;  Surgeon: Marty Heck, MD;  Location: [facility];  Service: Vascular;  Laterality: Left;   PATCH ANGIOPLASTY  Left 06/16/2020   Procedure: PATCH ANGIOPLASTY OF LEFT BELOW KNEE [place] ARTERY;  Surgeon: Marty Heck, MD;  Location: [facility];  Service: Vascular;  Laterality: Left;   PERIPHERAL VASCULAR BALLOON ANGIOPLASTY  11/28/2016   Procedure: PERIPHERAL VASCULAR BALLOON ANGIOPLASTY;  Surgeon: Adrian Prows, MD;  Location: [facility];  Service: Cardiovascular;;  SFA   PERIPHERAL VASCULAR BALLOON ANGIOPLASTY  12/03/2020   Procedure: PERIPHERAL VASCULAR BALLOON ANGIOPLASTY;  Surgeon: Cherre Robins, MD;  Location: [facility];  Service: Cardiovascular;;  TP Trunk and Peroneal   PERIPHERAL VASCULAR CATHETERIZATION N/A 08/11/2014   Procedure: Lower Extremity Angiography;  Surgeon: Adrian Prows, MD;  Location: [facility];  Service: Cardiovascular;  Laterality: N/A;   PERIPHERAL VASCULAR INTERVENTION Left 09/22/2019   Procedure: PERIPHERAL VASCULAR INTERVENTION;  Surgeon: Waynetta Sandy, MD;  Location: [facility];  Service: Cardiovascular;  Laterality: Left;   PERIPHERAL VASCULAR INTERVENTION Left 09/23/2019   Procedure: PERIPHERAL VASCULAR INTERVENTION;  Surgeon: Serafina Mitchell, MD;  Location: [facility];  Service: Cardiovascular;  Laterality: Left;   PERIPHERAL VASCULAR INTERVENTION Left 05/13/2020   Procedure: PERIPHERAL VASCULAR INTERVENTION;  Surgeon: Marty Heck, MD;  Location: [facility];  Service: Cardiovascular;  Laterality: Left;   PERIPHERAL VASCULAR INTERVENTION Left 12/02/2020   Procedure: PERIPHERAL VASCULAR INTERVENTION;  Surgeon: Marty Heck, MD;  Location: [facility];  Service: Cardiovascular;  Laterality: Left;   PERIPHERAL VASCULAR INTERVENTION  12/03/2020   Procedure: PERIPHERAL VASCULAR INTERVENTION;  Surgeon: Cherre Robins, MD;  Location: [facility];  Service: Cardiovascular;;   PERIPHERAL VASCULAR INTERVENTION Left 12/22/2020   Procedure: PERIPHERAL VASCULAR INTERVENTION;  Surgeon: Broadus John, MD;  Location: [facility];  Service: Cardiovascular;  Laterality: Left;   PERIPHERAL VASCULAR THROMBECTOMY  11/28/2016   Procedure: PERIPHERAL VASCULAR THROMBECTOMY;  Surgeon: Adrian Prows, MD;  Location: [facility];  Service: Cardiovascular;;  Profunda   PERIPHERAL VASCULAR THROMBECTOMY  11/28/2016   Procedure: PERIPHERAL VASCULAR THROMBECTOMY;  Surgeon: Adrian Prows, MD;  Location: [facility];  Service: Cardiovascular;;   PERIPHERAL VASCULAR THROMBECTOMY N/A 05/12/2020   Procedure: PERIPHERAL VASCULAR THROMBECTOMY-Left Leg;  Surgeon: Cherre Robins, MD;  Location: [facility];  Service: Cardiovascular;  Laterality: N/A;   PERIPHERAL VASCULAR THROMBECTOMY N/A 05/13/2020   Procedure: PERIPHERAL VASCULAR THROMBECTOMY- LYSIS RECHECK;  Surgeon: Marty Heck, MD;  Location: [facility];  Service: Cardiovascular;  Laterality: N/A;   PERIPHERAL VASCULAR THROMBECTOMY N/A 12/02/2020   Procedure: PERIPHERAL VASCULAR THROMBECTOMY;  Surgeon: Marty Heck, MD;  Location: [facility];  Service: Cardiovascular;  Laterality: N/A;   PERIPHERAL VASCULAR THROMBECTOMY N/A 12/03/2020   Procedure: LYSIS RECHECK;  Surgeon: Cherre Robins, MD;  Location: [facility];  Service: Cardiovascular;  Laterality: N/A;   PULMONARY THROMBECTOMY N/A 09/23/2019   Procedure: LYSIS RECHECK;  Surgeon: Serafina Mitchell, MD;  Location: [facility];  Service: Cardiovascular;  Laterality: N/A;   THROMBECTOMY FEMORAL ARTERY Left 12/14/2016   Procedure: THROMBECTOMY PROFUNDA FEMORAL ARTERY;  Surgeon: Conrad [place], MD;  Location: [facility];  Service: Vascular;  Laterality: Left;   THROMBECTOMY FEMORAL ARTERY Left 06/16/2020   Procedure: Redo exposure ot Left below knee popiteal artery;  Surgeon: Marty Heck, MD;  Location: [facility];  Service: Vascular;  Laterality: Left;   THROMBECTOMY OF BYPASS GRAFT FEMORAL- POPLITEAL ARTERY Left 06/16/2020   Procedure: THROMBECTOMY  OF BYPASS GRAFT FEMORAL-POPLITEAL ARTERY and TIBIAL ARTERY;  Surgeon: Marty Heck, MD;  Location: [facility];  Service: Vascular;  Laterality: Left;   THROMBECTOMY OF BYPASS GRAFT FEMORAL- POPLITEAL ARTERY Left 12/31/2020   Procedure: THROMBECTOMY OF LEFT COMMON FEMORAL TO BELOW KNEE POPLITEAL ARTERY BYPASS GRAFT;  Surgeon: Marty Heck, MD;  Location: [place];  Service: Vascular;  Laterality: Left;   VEIN HARVEST Left 12/31/2020   Procedure: Left Greater Saphenous Vein Harvest;  Surgeon: Marty Heck, MD;  Location: [place];  Service: Vascular;  Laterality: Left;   Patient Active Problem List   Diagnosis Date Noted   Peripheral artery disease ([place]) 12/31/2020   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[place]) 05/12/2020   Thrombosis of femoro-popliteal bypass graft ([place]) 05/12/2020   Critical lower limb ischemia ([place]) 12/11/2016   Claudication in peripheral vascular disease ([place]) 11/28/2016   HTN (hypertension) 10/09/2014   Tobacco use 10/09/2014   PAD (peripheral artery disease) ([place])     REFERRING DIAG: 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 He worked on Psychologist, sport and exercise to fix the belt, working on his knees.  PAIN:  Are you having pain?  Yes: NPRS scale: arrived to PT 0/10.  Pain location: distal residual limb Pain description: cramping when he wakes up, soreness walking & aches when sitting with prosthesis. Aggravating factors: walking with sudden increases in time Relieving factors: wearing prosthesis  OBJECTIVE:     POSTURE: 05/19/21:  rounded shoulders, forward head, flexed trunk , and weight shift right   LE ROM:   ROM P:passive  A:active Right 05/19/2021 Left 05/19/2021 Left 08/11/2021  Hip flexion       Hip extension   -15* standing -5* standing  Hip abduction       Hip adduction       Hip internal rotation       Hip external rotation       Knee flexion       Knee extension       Ankle dorsiflexion       Ankle plantarflexion       Ankle inversion       Ankle eversion        (Blank rows = not tested) MMT:   MMT Right 05/19/2021 Left 05/19/2021 Left 08/11/21  Hip flexion       Hip extension   3/5 Gross functional 4/5 Gross functional  Hip abduction   3/5 Gross functional 4/5 Gross functional  Hip adduction       Hip internal rotation       Hip external rotation       Knee flexion       Knee extension       Ankle dorsiflexion  Ankle plantarflexion       Ankle inversion       Ankle eversion       (Blank rows = not tested)   TRANSFERS: Sit to stand: 05/19/21:  SBA and requires UE assist with armrests & RW touch to stabilize Stand to sit: 05/19/21:  SBA and requires UE assist with armrests & RW stabilization   GAIT: 08/10/2021:  Pt neg curb with RW & TFA prosthesis modified independent. Pt neg ramp with RW & TFA prosthesis with verbal cues on hand position on RW to descend. Pt neg stairs with single rail & cane safely modified independent.   08/09/2021: pt amb 516' in 9 min with RW & TFA prosthesis modified independent. Post gait HR 67 SpO2 97%  Gait velocity: 1.10 ft/sec  He had one misstep that he self corrected.    Pattern: step to with decreased right step length, left hip hike.  Good prosthetic foot clearance, minimal abduction intermittently noted.   05/19/21:  Gait pattern: step to pattern, decreased step length- Right, decreased stance time- Left, decreased hip/knee flexion- Left, circumduction- Left, Left hip hike, antalgic, trunk flexed, abducted- Left, and poor  foot clearance- Left  Distance walked: 70' Assistive device utilized: Environmental consultant - 2 wheeled and TFA prosthesis  excessive UE weight bearing Level of assistance: SBA   FUNCTIONAL TESTs:  08/11/2021  TUG with cane stand alone tip:  PT had pt walk one TUG path with minA for warm-up.  Std TUG 95.03 sec with minA  Cog TUG 146.38 sec with minA with pt describing how to build doghouse, able to talk entire time including turns.  Manual TUG 137.65 sec carrying open bottle of water with no spills.  08/09/2021  Berg Balance 34/56 & tasks of Berg with cane support 46/56    Berg Balance Scale: 05/19/21:  17/56   [facility] PT Assessment - 05/19/21 0900                Standardized Balance Assessment    Standardized Balance Assessment Berg Balance Test          Berg Balance Test    Sit to Stand Needs minimal aid to stand or to stabilize     Standing Unsupported Able to stand 2 minutes with supervision     Sitting with Back Unsupported but Feet Supported on Floor or Stool Able to sit safely and securely 2 minutes     Stand to Sit Controls descent by using hands     Transfers Able to transfer safely, definite need of hands     Standing Unsupported with Eyes Closed Unable to keep eyes closed 3 seconds but stays steady     Standing Unsupported with Feet Together Needs help to attain position but able to stand for 30 seconds with feet together     From Standing, Reach Forward with Outstretched Arm Reaches forward but needs supervision     From Standing Position, Pick up Object from Floor Unable to try/needs assist to keep balance     From Standing Position, Turn to Look Behind Over each Shoulder Needs assist to keep from losing balance and falling     Turn 360 Degrees Needs assistance while turning     Standing Unsupported, Alternately Place Feet on Step/Stool Needs assistance to keep from falling or unable to try     Standing Unsupported, One Foot in [employer] balance while stepping or standing     Standing  on One Leg Unable to try or  needs assist to prevent fall     Total Score 17     Berg comment: BERG  < 36 high risk for falls (close to 100%) 46-51 moderate (>50%)   37-45 significant (>80%) 52-55 lower (> 25%)                   CURRENT PROSTHETIC WEAR ASSESSMENT: 05/19/21:  Patient is dependent with: skin check, residual limb care, care of non-amputated limb, prosthetic cleaning, ply sock cleaning, correct ply sock adjustment, proper wear schedule/adjustment, and proper weight-bearing schedule/adjustment Donning prosthesis: SBA Doffing prosthesis: Modified independence Prosthetic wear tolerance: 5-6 hours/day, 16 of 16 days since delivery.  Prosthetic weight bearing tolerance: 5 minutes with partial weight on prosthesis & intermittent support with RW Residual limb condition: 2 invaginated areas with one not clean, 1 small area on incision that may be suture working way out, dry skin, cylinderical shape, nonpitting edema,  Prosthetic description: silicon liner with velcro lanyard, ischial containment socket with flexible inner liner, SAFETY knee (single axis nonhydraulic), K2 flexible keel foot    TODAY'S TREATMENT:  09/07/21 Prosthetic Training with Transfemoral Prosthesis: PT education on walker skis to allow for easier community navigation with verbal and visual picture, pt verbalized understanding PT explained rollator sizing, braking, and sitting sequence with verbal and visual demo, pt verbalized understanding and showed utilization of brake during walking 53', 20', 40' with 67* Rt and Lt turns with supervision, and sitting x 2 on rollator x 3 on chair PT demo ascending and descending curb and ramp with visual and verbal explanation, pt verbalized understanding and returned demo x 1 ramp and curb with CGA and minor verbal cueing for step negotiation  Pt ambulate 63' with cane in RUE and supervision, reporting leg fatigue and claudication pain  09/02/21 Prosthetic Training with  Transfemoral Prosthesis: Walking around obstacles 5' apart with turns right & left carrying 2lb weight with cane with supervision, pt recovered one minor loss of balance Walking around square with sidestepping & backwards with cane and carrying 2lb weighted ball with supervision, verbal cueing for foot placement Pt ambulate 35' x 2 with cane and conversation, working on gait with distraction. Ascending and descending 2 steps on ladder with CGA and verbal cueing. PT demo and cueing for step sequence, foot and knee placement, prosthesis placement. Pt verbalized understanding.   Therapeutic Activity Standing turning to look over bil. shoulder x 5 ea., verbal and visual cueing for weight shift Standing UE reach overhead to cabinet with bottle of water x 1 ea. UE, 2lb weight x 2 ea. UE, 8lb weight x 4 with BUE, verbal cueing for prosthesis placement and weight shift. Working on postural stability with household chores, pt appears safe to do so with no losses of balance. Standing single UE holding 3lb bar against cabinet overhead, used both UEs. 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  08/31/21 Prosthetic Training with Transfemoral Prosthesis: Walking around obstacles 5' apart with turns right & left carrying open bottle of water with cane with supervision and minA to recover one loss of balance Walking 30' with cane with supervision, PT demonstrate option for family member SBA when walking in household or to walk near furniture without holding it and pt verbalized understanding Walking around square with sidestepping & backwards with cane and carrying 2lb weighted ball with supervision Pt able to pick up 6" cone from floor with supervision, was independent with managing prosthesis and cane for technique Pt amb 70' including over  1" obstacle x2 reps and around equipment in gym with cane, verbal cueing for foot placement with obstacle. Working on gait around household furniture  and stepping over thresholds requiring minA to step over cane on floor to simulate threshold.    Access Code: [email or web address] URL: [email or web address] Date: 07/04/2021 Prepared by: Jamey Reas  Exercises - Seated Hamstring Stretch with Strap  - 2 x daily - 7 x weekly - 1 sets - 2-3 reps - 30 seconds hold - Modified Riviere Stretch  - 2 x daily - 7 x weekly - 1 sets - 2 reps - 30 seconds hold - Supine Dynamic Modified Nabozny Quad and Hip Flexor Dynamic Stretch  - 2 x daily - 7 x weekly - 1 sets - 2 reps - 30 seconds hold - Standing posture with back to counter  - 2 x daily - 7 x weekly - 1 sets - 1 reps - 5-10 minutes hold - Upright Stance at Door Frame Single Arm  - 3-6 x daily - 7 x weekly - 1 sets - 2 reps - 2 deep breathes hold - Upright Stance at Door Frame with Both Arms  - 3-6 x daily - 7 x weekly - 1 sets - 2 reps - 2 deep breathes hold - feet together eyes open on floor  - 1 x daily - 4-7 x weekly - 1 sets - 10 reps - 2 seconds hold - Feet Apart with Eyes Closed with Head Motions  - 1 x daily - 4-7 x weekly - 1 sets - 10 reps - 2 seconds hold - Wide stance on Foam Pad head movements  - 1 x daily - 4-7 x weekly - 1 sets - 10 reps - 2 seconds hold - Standing Quarter Turn with Counter Support  - 1 x daily - 4-7 x weekly - 1 sets - 10 reps - Side Stepping with Counter Support  - 1 x daily - 5-7 x weekly - 1 sets - 10 reps - Backward Walking with Counter Support  - 1 x daily - 5-7 x weekly - 1 sets - 10 reps    05/24/21 1151  PT Education  Education Details HEP at sink & residual limb pain vs phantom sensation vs phantom pain  Person(s) Educated Patient  Methods Explanation;Demonstration;Tactile cues;Verbal cues;Handout  Comprehension Verbalized understanding;Returned demonstration;Verbal cues required;Tactile cues required;Need further instruction   ASSESSMENT:  CLINICAL IMPRESSION: Today's session focused on safe navigation with the rollator as the patient mentioned he  has one and would like to know if he can use it. He managed walking and turning, braking, and turning to seated position with supervision and minor verbal cues for placement and sequencing after demonstration, and navigated ramp and curb with contact guard assist. Ramp and curb will be re-assessed next session for comprehension before full clearance for rollator use in community, but he appears safe to use it in the household and on flat surfaces outside of the home. He tried to complete STG of 100' ambulation with cane and minA, but this task was at the end of the session and he reported increased claudication pain and fatigue after 50' so this can be reassessed. He continues to benefit from skilled PT.   OBJECTIVE IMPAIRMENTS Abnormal gait, decreased activity tolerance, decreased balance, decreased endurance, decreased knowledge of condition, decreased knowledge of use of DME, decreased mobility, decreased ROM, decreased strength, impaired flexibility, postural dysfunction, prosthetic dependency , and pain.    ACTIVITY LIMITATIONS community activity and occupation.  PERSONAL FACTORS Fitness, Time since onset of injury/illness/exacerbation, and 3+ comorbidities: see PMH  are also affecting patient's functional outcome.    REHAB POTENTIAL: Good   CLINICAL DECISION MAKING: Evolving/moderate complexity   EVALUATION COMPLEXITY: Moderate     GOALS: Goals reviewed with patient? Yes   SHORT TERM GOALS: Target date: 09/08/2021   Patient verbalizes how to decrease residual limb pain. Goal status: met 09/07/21 2.  Patient tolerates prosthesis >90% awake hrs /day without skin issues or limb pain </= 2/10 >/= 75% of days after standing / walking. Goal status: met 09/07/21   3. Patient ambulates 100' with cane stand alone tip without hand hold assist & prosthesis with min guard. Goal status: ongoing 09/07/2021, fatigued by end of session  LONG TERM GOALS: Target date: 05/02/3534 (reset at  re-certification)   Patient demonstrates & verbalized understanding of prosthetic care including issues to address limb pain to enable safe utilization of prosthesis. Baseline: SEE OBJECTIVE DATA Goal status: ongoing 08/09/2021   Patient tolerates prosthesis wear >90% of awake hours without skin or limb pain issues. Baseline: SEE OBJECTIVE DATA Goal status: partially met 08/09/2021   Merrilee Jansky Balance >/= 45/56 to indicate lower fall risk Baseline: SEE OBJECTIVE DATA Goal status: reset to higher level on 08/11/2021   Patient ambulates >500' with LRAD & prosthesis modified independent. Baseline: SEE OBJECTIVE DATA Goal status: met 08/09/2021 with RW   Patient negotiates ramps, curbs & stairs with single rail with LRAD & prosthesis modified independent. Baseline: SEE OBJECTIVE DATA Goal status: met 08/11/2021 with RW  6.  Patient ambulates 100' with cane & prosthesis modified independent.  Baseline: SEE OBJECTIVE DATA Goal status: NEW 08/11/2021  7. Timed Up & Go with cane stand alone tip modified independent:  Std TUG <60sec, Cog TUG <51mn, Manual TUG <2 min Baseline: SEE OBJECTIVE DATA Goal status: NEW 08/11/2021   PLAN: PT FREQUENCY: 2x/week   PT DURATION: 8 weeks   PLANNED INTERVENTIONS: Therapeutic exercises, Therapeutic activity, Neuromuscular re-education, Balance training, Gait training, Patient/Family education, Stair training, Vestibular training, Prosthetic training, and DME instructions, Physical Performance Testing   PLAN FOR NEXT SESSION:  Reassess STG of walking 100' with cane, reassess curb and ramp with rollator, static balance during standing banded resistance exercise for strengthening and stability during construction tasks  [employer] Student-PT 09/07/2021, 3:52 PM   This entire session of physical therapy was performed under the direct supervision of PT signing evaluation /treatment. PT reviewed note and agrees.  RJamey Reas PT, DPT 09/07/2021, 4:07 PM

## 2021-09-12 ENCOUNTER — Encounter: Payer: Self-pay | Admitting: Physical Therapy

## 2021-09-12 ENCOUNTER — Encounter: Payer: Medicare Other | Admitting: Physical Therapy

## 2021-09-12 ENCOUNTER — Ambulatory Visit (INDEPENDENT_AMBULATORY_CARE_PROVIDER_SITE_OTHER): Payer: Medicare Other | Admitting: Physical Therapy

## 2021-09-12 DIAGNOSIS — G546 Phantom limb syndrome with pain: Secondary | ICD-10-CM

## 2021-09-12 DIAGNOSIS — M6281 Muscle weakness (generalized): Secondary | ICD-10-CM | POA: Diagnosis not present

## 2021-09-12 DIAGNOSIS — R293 Abnormal posture: Secondary | ICD-10-CM

## 2021-09-12 DIAGNOSIS — M79605 Pain in left leg: Secondary | ICD-10-CM

## 2021-09-12 DIAGNOSIS — R2689 Other abnormalities of gait and mobility: Secondary | ICD-10-CM

## 2021-09-12 DIAGNOSIS — R2681 Unsteadiness on feet: Secondary | ICD-10-CM | POA: Diagnosis not present

## 2021-09-12 NOTE — Therapy (Signed)
OUTPATIENT PHYSICAL THERAPY PROSTHETIC TREATMENT NOTE  Patient Name: Ryan [name] MRN: [id number] DOB:03/02/1950, 71 y.o., male Today's Date: 09/12/2021  PCP: Iona Beard, MD REFERRING PROVIDER: Karoline Caldwell, PA-C  End of Session:   PT End of Session - 09/12/21 0930     Visit Number 34    Number of Visits 41    Date for PT Re-Evaluation 10/06/21    Authorization Type Medicare A/B & Generic commercial    Progress Note Due on Visit 35    PT Start Time 0930    PT Stop Time 1015    PT Time Calculation (min) 45 min    Equipment Utilized During Treatment Gait belt    Activity Tolerance Patient tolerated treatment well    Behavior During Therapy WFL for tasks assessed/performed             Past Medical History:  Diagnosis Date   COVID 2022   Hypertension    PVD (peripheral vascular disease) ([place])    Past Surgical History:  Procedure Laterality Date   ABDOMINAL AORTAGRAM Left 12/13/2016   ABDOMINAL AORTOGRAM W/LOWER EXTREMITY/notes 12/13/2016   ABDOMINAL AORTOGRAM W/LOWER EXTREMITY N/A 11/28/2016   Procedure: ABDOMINAL AORTOGRAM W/LOWER EXTREMITY;  Surgeon: Adrian Prows, MD;  Location: [facility];  Service: Cardiovascular;  Laterality: N/A;  bilateral   ABDOMINAL AORTOGRAM W/LOWER EXTREMITY N/A 12/13/2016   Procedure: ABDOMINAL AORTOGRAM W/LOWER EXTREMITY;  Surgeon: Waynetta Sandy, MD;  Location: [facility];  Service: Cardiovascular;  Laterality: N/A;  unilater left leg   ABDOMINAL AORTOGRAM W/LOWER EXTREMITY N/A 01/09/2018   Procedure: ABDOMINAL AORTOGRAM W/LOWER EXTREMITY;  Surgeon: Marty Heck, MD;  Location: [facility];  Service: Cardiovascular;  Laterality: N/A;   ABDOMINAL AORTOGRAM W/LOWER EXTREMITY Left 09/22/2019   Procedure: ABDOMINAL AORTOGRAM W/LOWER EXTREMITY;  Surgeon: Waynetta Sandy, MD;  Location: [facility];  Service: Cardiovascular;  Laterality: Left;   ABDOMINAL AORTOGRAM W/LOWER EXTREMITY Left  12/02/2020   Procedure: ABDOMINAL AORTOGRAM W/LOWER EXTREMITY;  Surgeon: Marty Heck, MD;  Location: [facility];  Service: Cardiovascular;  Laterality: Left;   ABDOMINAL AORTOGRAM W/LOWER EXTREMITY N/A 12/22/2020   Procedure: ABDOMINAL AORTOGRAM W/LOWER EXTREMITY;  Surgeon: Broadus John, MD;  Location: [facility];  Service: Cardiovascular;  Laterality: N/A;   AMPUTATION Left 02/11/2021   Procedure: LEFT ABOVE KNEE AMPUTATION;  Surgeon: Marty Heck, MD;  Location: [facility];  Service: Vascular;  Laterality: Left;   BYPASS GRAFT POPLITEAL TO TIBIAL Left 12/31/2020   Procedure: LEFT JUMP GRAFT REVISION BELOW KNEE POPLITEAL TO PERONEAL;  Surgeon: Marty Heck, MD;  Location: [facility];  Service: Vascular;  Laterality: Left;   EMBOLIZATION Left 12/03/2020   Procedure: EMBOLIZATION;  Surgeon: Cherre Robins, MD;  Location: [facility];  Service: Cardiovascular;  Laterality: Left;   FEMORAL-POPLITEAL BYPASS GRAFT Left 12/14/2016   Procedure: BYPASS GRAFT COMMON FEMORAL- BELOW KNEE POPLITEAL ARTERY with Bovine Patch to Below the knee poplitieal artery.;  Surgeon: Conrad Pineland, MD;  Location: [facility];  Service: Vascular;  Laterality: Left;   FEMORAL-POPLITEAL BYPASS GRAFT Left 01/28/2018   Procedure: BYPASS GRAFT FEMORAL-BELOW KNEE POPLITEAL ARTERY REDO with propaten vascular graft removable ring;  Surgeon: Marty Heck, MD;  Location: [facility];  Service: Vascular;  Laterality: Left;   FEMUR SURGERY Right 2009   pt. has a rod in that leg   LOWER EXTREMITY ANGIOGRAPHY Left 11/28/2016   Procedure: Lower Extremity Angiography;  Surgeon: Einar Gip,  Ulice Dash, MD;  Location: [facility];  Service: Cardiovascular;  Laterality: Left;  lysis followup lower leg   PATCH ANGIOPLASTY Left 01/28/2018   Procedure: PATCH ANGIOPLASTY USING Rueben Bash BIOLOGIC PATCH;  Surgeon: Marty Heck, MD;  Location: [facility];  Service: Vascular;  Laterality: Left;   PATCH ANGIOPLASTY  Left 06/16/2020   Procedure: PATCH ANGIOPLASTY OF LEFT BELOW KNEE [place] ARTERY;  Surgeon: Marty Heck, MD;  Location: [facility];  Service: Vascular;  Laterality: Left;   PERIPHERAL VASCULAR BALLOON ANGIOPLASTY  11/28/2016   Procedure: PERIPHERAL VASCULAR BALLOON ANGIOPLASTY;  Surgeon: Adrian Prows, MD;  Location: [facility];  Service: Cardiovascular;;  SFA   PERIPHERAL VASCULAR BALLOON ANGIOPLASTY  12/03/2020   Procedure: PERIPHERAL VASCULAR BALLOON ANGIOPLASTY;  Surgeon: Cherre Robins, MD;  Location: [facility];  Service: Cardiovascular;;  TP Trunk and Peroneal   PERIPHERAL VASCULAR CATHETERIZATION N/A 08/11/2014   Procedure: Lower Extremity Angiography;  Surgeon: Adrian Prows, MD;  Location: [facility];  Service: Cardiovascular;  Laterality: N/A;   PERIPHERAL VASCULAR INTERVENTION Left 09/22/2019   Procedure: PERIPHERAL VASCULAR INTERVENTION;  Surgeon: Waynetta Sandy, MD;  Location: [facility];  Service: Cardiovascular;  Laterality: Left;   PERIPHERAL VASCULAR INTERVENTION Left 09/23/2019   Procedure: PERIPHERAL VASCULAR INTERVENTION;  Surgeon: Serafina Mitchell, MD;  Location: [facility];  Service: Cardiovascular;  Laterality: Left;   PERIPHERAL VASCULAR INTERVENTION Left 05/13/2020   Procedure: PERIPHERAL VASCULAR INTERVENTION;  Surgeon: Marty Heck, MD;  Location: [facility];  Service: Cardiovascular;  Laterality: Left;   PERIPHERAL VASCULAR INTERVENTION Left 12/02/2020   Procedure: PERIPHERAL VASCULAR INTERVENTION;  Surgeon: Marty Heck, MD;  Location: [facility];  Service: Cardiovascular;  Laterality: Left;   PERIPHERAL VASCULAR INTERVENTION  12/03/2020   Procedure: PERIPHERAL VASCULAR INTERVENTION;  Surgeon: Cherre Robins, MD;  Location: [facility];  Service: Cardiovascular;;   PERIPHERAL VASCULAR INTERVENTION Left 12/22/2020   Procedure: PERIPHERAL VASCULAR INTERVENTION;  Surgeon: Broadus John, MD;  Location: [facility];  Service: Cardiovascular;  Laterality: Left;   PERIPHERAL VASCULAR THROMBECTOMY  11/28/2016   Procedure: PERIPHERAL VASCULAR THROMBECTOMY;  Surgeon: Adrian Prows, MD;  Location: [facility];  Service: Cardiovascular;;  Profunda   PERIPHERAL VASCULAR THROMBECTOMY  11/28/2016   Procedure: PERIPHERAL VASCULAR THROMBECTOMY;  Surgeon: Adrian Prows, MD;  Location: [facility];  Service: Cardiovascular;;   PERIPHERAL VASCULAR THROMBECTOMY N/A 05/12/2020   Procedure: PERIPHERAL VASCULAR THROMBECTOMY-Left Leg;  Surgeon: Cherre Robins, MD;  Location: [facility];  Service: Cardiovascular;  Laterality: N/A;   PERIPHERAL VASCULAR THROMBECTOMY N/A 05/13/2020   Procedure: PERIPHERAL VASCULAR THROMBECTOMY- LYSIS RECHECK;  Surgeon: Marty Heck, MD;  Location: [facility];  Service: Cardiovascular;  Laterality: N/A;   PERIPHERAL VASCULAR THROMBECTOMY N/A 12/02/2020   Procedure: PERIPHERAL VASCULAR THROMBECTOMY;  Surgeon: Marty Heck, MD;  Location: [facility];  Service: Cardiovascular;  Laterality: N/A;   PERIPHERAL VASCULAR THROMBECTOMY N/A 12/03/2020   Procedure: LYSIS RECHECK;  Surgeon: Cherre Robins, MD;  Location: [facility];  Service: Cardiovascular;  Laterality: N/A;   PULMONARY THROMBECTOMY N/A 09/23/2019   Procedure: LYSIS RECHECK;  Surgeon: Serafina Mitchell, MD;  Location: [facility];  Service: Cardiovascular;  Laterality: N/A;   THROMBECTOMY FEMORAL ARTERY Left 12/14/2016   Procedure: THROMBECTOMY PROFUNDA FEMORAL ARTERY;  Surgeon: Conrad Stark, MD;  Location: [facility];  Service: Vascular;  Laterality: Left;   THROMBECTOMY FEMORAL ARTERY Left 06/16/2020   Procedure: Redo exposure ot Left below knee popiteal artery;  Surgeon: Marty Heck, MD;  [facility];  Service: Vascular;  Laterality: Left;   THROMBECTOMY OF BYPASS GRAFT FEMORAL- POPLITEAL ARTERY Left 06/16/2020   Procedure: THROMBECTOMY  OF BYPASS GRAFT FEMORAL-POPLITEAL ARTERY and TIBIAL ARTERY;  Surgeon: Marty Heck, MD;  Location: [place] 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)     REFERRING DIAG: R60.454 Left Transfemoral Amputation, I73.9 PAD  ONSET DATE: 05/03/2021 prosthesis delivery  THERAPY DIAG:  Unsteadiness on feet  Muscle weakness (generalized)  Abnormal posture  Other abnormalities of gait and mobility  Phantom limb syndrome with pain ([place])  Pain in left leg  PERTINENT HISTORY: left TFA, HTN, PVD, Covid, ORIF left femur 2009  PRECAUTIONS: Fall  SUBJECTIVE:   He did a lot of walking over the weekend and hasn't tried the rollator at home yet.  PAIN:  Are you having pain?  Yes: NPRS scale: arrived to PT 0/10.  Pain location: distal residual limb Pain description: cramping when he wakes up, soreness walking & aches when sitting with prosthesis. Aggravating factors: walking with sudden increases in time Relieving factors: wearing prosthesis  OBJECTIVE:    POSTURE: 05/19/21:  rounded shoulders, forward head, flexed trunk , and weight shift  right   LE ROM:   ROM P:passive  A:active Right 05/19/2021 Left 05/19/2021 Left 08/11/2021  Hip flexion       Hip extension   -15* standing -5* standing  Hip abduction       Hip adduction       Hip internal rotation       Hip external rotation       Knee flexion       Knee extension       Ankle dorsiflexion       Ankle plantarflexion       Ankle inversion       Ankle eversion        (Blank rows = not tested) MMT:   MMT Right 05/19/2021 Left 05/19/2021 Left 08/11/21  Hip flexion       Hip extension   3/5 Gross functional 4/5 Gross functional  Hip abduction   3/5 Gross functional 4/5 Gross functional  Hip adduction       Hip internal rotation       Hip external rotation       Knee flexion       Knee extension       Ankle dorsiflexion       Ankle plantarflexion       Ankle inversion  Ankle eversion       (Blank rows = not tested)   TRANSFERS: Sit to stand: 05/19/21:  SBA and requires UE assist with armrests & RW touch to stabilize Stand to sit: 05/19/21:  SBA and requires UE assist with armrests & RW stabilization   GAIT: 08/10/2021:  Pt neg curb with RW & TFA prosthesis modified independent. Pt neg ramp with RW & TFA prosthesis with verbal cues on hand position on RW to descend. Pt neg stairs with single rail & cane safely modified independent.   08/09/2021: pt amb 516' in 9 min with RW & TFA prosthesis modified independent. Post gait HR 67 SpO2 97%  Gait velocity: 1.10 ft/sec  He had one misstep that he self corrected.    Pattern: step to with decreased right step length, left hip hike.  Good prosthetic foot clearance, minimal abduction intermittently noted.   05/19/21:  Gait pattern: step to pattern, decreased step length- Right, decreased stance time- Left, decreased hip/knee flexion- Left, circumduction- Left, Left hip hike, antalgic, trunk flexed, abducted- Left, and poor foot clearance- Left  Distance walked: 43' Assistive device utilized: Environmental consultant - 2  wheeled and TFA prosthesis  excessive UE weight bearing Level of assistance: SBA   FUNCTIONAL TESTs:  08/11/2021  TUG with cane stand alone tip:  PT had pt walk one TUG path with minA for warm-up.  Std TUG 95.03 sec with minA  Cog TUG 146.38 sec with minA with pt describing how to build doghouse, able to talk entire time including turns.  Manual TUG 137.65 sec carrying open bottle of water with no spills.  08/09/2021  Berg Balance 34/56 & tasks of Berg with cane support 46/56    Berg Balance Scale: 05/19/21:  17/56   [facility] PT Assessment - 05/19/21 0900                Standardized Balance Assessment    Standardized Balance Assessment Berg Balance Test          Berg Balance Test    Sit to Stand Needs minimal aid to stand or to stabilize     Standing Unsupported Able to stand 2 minutes with supervision     Sitting with Back Unsupported but Feet Supported on Floor or Stool Able to sit safely and securely 2 minutes     Stand to Sit Controls descent by using hands     Transfers Able to transfer safely, definite need of hands     Standing Unsupported with Eyes Closed Unable to keep eyes closed 3 seconds but stays steady     Standing Unsupported with Feet Together Needs help to attain position but able to stand for 30 seconds with feet together     From Standing, Reach Forward with Outstretched Arm Reaches forward but needs supervision     From Standing Position, Pick up Object from Floor Unable to try/needs assist to keep balance     From Standing Position, Turn to Look Behind Over each Shoulder Needs assist to keep from losing balance and falling     Turn 360 Degrees Needs assistance while turning     Standing Unsupported, Alternately Place Feet on Step/Stool Needs assistance to keep from falling or unable to try     Standing Unsupported, One Foot in [employer] balance while stepping or standing     Standing on One Leg Unable to try or needs assist to prevent fall     Total Score 17  Berg comment: BERG  < 36 high risk for falls (close to 100%) 46-51 moderate (>50%)   37-45 significant (>80%) 52-55 lower (> 25%)                   CURRENT PROSTHETIC WEAR ASSESSMENT: 05/19/21:  Patient is dependent with: skin check, residual limb care, care of non-amputated limb, prosthetic cleaning, ply sock cleaning, correct ply sock adjustment, proper wear schedule/adjustment, and proper weight-bearing schedule/adjustment Donning prosthesis: SBA Doffing prosthesis: Modified independence Prosthetic wear tolerance: 5-6 hours/day, 16 of 16 days since delivery.  Prosthetic weight bearing tolerance: 5 minutes with partial weight on prosthesis & intermittent support with RW Residual limb condition: 2 invaginated areas with one not clean, 1 small area on incision that may be suture working way out, dry skin, cylinderical shape, nonpitting edema,  Prosthetic description: silicon liner with velcro lanyard, ischial containment socket with flexible inner liner, SAFETY knee (single axis nonhydraulic), K2 flexible keel foot    TODAY'S TREATMENT:  09/12/21 Prosthetic Training with Transfemoral Prosthesis: PT educate on indication for tightening suspension strap, pt verbalized understanding and tightened strap Pt ambulate 100' with cane in RUE and supervision, reporting Rt leg fatigue at end of walk Pt ambulate 105' with rollator and supervision including ramp, curb, turn to sit, sit to stand, and carrying food and drink on walker seat. Pt required verbal cueing for locking brakes before ramp negotiation and to walk inside of handles Pt asked about further work at home, PT advised walking at home for "long walk" to moderate claudication sx with walker or rollator 1-2x/day to improve walking aerobic endurance , short walks with cane in house between rooms with high frequency every 30-60 minutes at minimum and medium distance (longer than short walk but not to long walk / max tolerable distance)  at least  5x/day with RW or rollator walker.   Neuro Re-ed: Standing lateral weight shift with cane in Lt hand and tactile and verbal cueing for pelvis motion over prosthesis Standing looking over shoulder x 3 bil., visual cueing for target at eye level and lower level  09/07/21 Prosthetic Training with Transfemoral Prosthesis: PT education on walker skis to allow for easier community navigation with verbal and visual picture, pt verbalized understanding PT explained rollator sizing, braking, and sitting sequence with verbal and visual demo, pt verbalized understanding and showed utilization of brake during walking 53', 20', 40' with 42* Rt and Lt turns with supervision, and sitting x 2 on rollator x 3 on chair PT demo ascending and descending curb and ramp with visual and verbal explanation, pt verbalized understanding and returned demo x 1 ramp and curb with CGA and minor verbal cueing for step negotiation  Pt ambulate 34' with cane in RUE and supervision, reporting leg fatigue and claudication pain  09/02/21 Prosthetic Training with Transfemoral Prosthesis: Walking around obstacles 5' apart with turns right & left carrying 2lb weight with cane with supervision, pt recovered one minor loss of balance Walking around square with sidestepping & backwards with cane and carrying 2lb weighted ball with supervision, verbal cueing for foot placement Pt ambulate 35' x 2 with cane and conversation, working on gait with distraction. Ascending and descending 2 steps on ladder with CGA and verbal cueing. PT demo and cueing for step sequence, foot and knee placement, prosthesis placement. Pt verbalized understanding.   Therapeutic Activity Standing turning to look over bil. shoulder x 5 ea., verbal and visual cueing for weight shift Standing UE reach overhead to cabinet  with bottle of water x 1 ea. UE, 2lb weight x 2 ea. UE, 8lb weight x 4 with BUE, verbal cueing for prosthesis placement and weight shift. Working on  postural stability with household chores, pt appears safe to do so with no losses of balance. Standing single UE holding 3lb bar against cabinet overhead, used both UEs. 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  Access Code: [email or web address] URL: [email or web address] Date: 07/04/2021 Prepared by: Jamey Reas  Exercises - Seated Hamstring Stretch with Strap  - 2 x daily - 7 x weekly - 1 sets - 2-3 reps - 30 seconds hold - Modified [place] Stretch  - 2 x daily - 7 x weekly - 1 sets - 2 reps - 30 seconds hold - Supine Dynamic Modified Grow Quad and Hip Flexor Dynamic Stretch  - 2 x daily - 7 x weekly - 1 sets - 2 reps - 30 seconds hold - Standing posture with back to counter  - 2 x daily - 7 x weekly - 1 sets - 1 reps - 5-10 minutes hold - Upright Stance at Door Frame Single Arm  - 3-6 x daily - 7 x weekly - 1 sets - 2 reps - 2 deep breathes hold - Upright Stance at Door Frame with Both Arms  - 3-6 x daily - 7 x weekly - 1 sets - 2 reps - 2 deep breathes hold - feet together eyes open on floor  - 1 x daily - 4-7 x weekly - 1 sets - 10 reps - 2 seconds hold - Feet Apart with Eyes Closed with Head Motions  - 1 x daily - 4-7 x weekly - 1 sets - 10 reps - 2 seconds hold - Wide stance on Foam Pad head movements  - 1 x daily - 4-7 x weekly - 1 sets - 10 reps - 2 seconds hold - Standing Quarter Turn with Counter Support  - 1 x daily - 4-7 x weekly - 1 sets - 10 reps - Side Stepping with Counter Support  - 1 x daily - 5-7 x weekly - 1 sets - 10 reps - Backward Walking with Counter Support  - 1 x daily - 5-7 x weekly - 1 sets - 10 reps    05/24/21 1151  PT Education  Education Details HEP at sink & residual limb pain vs phantom sensation vs phantom pain  Person(s) Educated Patient  Methods Explanation;Demonstration;Tactile cues;Verbal cues;Handout  Comprehension Verbalized understanding;Returned demonstration;Verbal cues required;Tactile cues required;Need  further instruction   ASSESSMENT:  CLINICAL IMPRESSION: He appears safe to use the rollator at home and in the community, requiring verbal cueing for ramp negotiation and staying within the handles. He met the STG of walking 100' with the cane and minA, requiring supervision today. Future sessions should reinforce home exercise/activity suggestions in anticipation of discharge from PT services. He continues to benefit from skilled PT.   OBJECTIVE IMPAIRMENTS Abnormal gait, decreased activity tolerance, decreased balance, decreased endurance, decreased knowledge of condition, decreased knowledge of use of DME, decreased mobility, decreased ROM, decreased strength, impaired flexibility, postural dysfunction, prosthetic dependency , and pain.    ACTIVITY LIMITATIONS community activity and occupation.    PERSONAL FACTORS Fitness, Time since onset of injury/illness/exacerbation, and 3+ comorbidities: see PMH  are also affecting patient's functional outcome.    REHAB POTENTIAL: Good   CLINICAL DECISION MAKING: Evolving/moderate complexity   EVALUATION COMPLEXITY: Moderate     GOALS: Goals reviewed with patient? Yes  SHORT TERM GOALS: Target date: 09/08/2021   Patient verbalizes how to decrease residual limb pain. Goal status: met 09/07/21 2.  Patient tolerates prosthesis >90% awake hrs /day without skin issues or limb pain </= 2/10 >/= 75% of days after standing / walking. Goal status: met 09/07/21   3. Patient ambulates 100' with cane stand alone tip without hand hold assist & prosthesis with min guard. Goal status: met 09/12/21 SBA  LONG TERM GOALS: Target date: 0/01/69 (reset at re-certification)   Patient demonstrates & verbalized understanding of prosthetic care including issues to address limb pain to enable safe utilization of prosthesis. Baseline: SEE OBJECTIVE DATA Goal status: ongoing 08/09/2021   Patient tolerates prosthesis wear >90% of awake hours without skin or limb pain  issues. Baseline: SEE OBJECTIVE DATA Goal status: partially met 08/09/2021   Merrilee Jansky Balance >/= 45/56 to indicate lower fall risk Baseline: SEE OBJECTIVE DATA Goal status: reset to higher level on 08/11/2021   Patient ambulates >500' with LRAD & prosthesis modified independent. Baseline: SEE OBJECTIVE DATA Goal status: met 08/09/2021 with RW   Patient negotiates ramps, curbs & stairs with single rail with LRAD & prosthesis modified independent. Baseline: SEE OBJECTIVE DATA Goal status: met 08/11/2021 with RW, ramp and curb 09/12/21 with rollator  6.  Patient ambulates 100' with cane & prosthesis modified independent.  Baseline: SEE OBJECTIVE DATA Goal status: NEW 08/11/2021  7. Timed Up & Go with cane stand alone tip modified independent:  Std TUG <60sec, Cog TUG <69mn, Manual TUG <2 min Baseline: SEE OBJECTIVE DATA Goal status: NEW 08/11/2021   PLAN: PT FREQUENCY: 2x/week   PT DURATION: 8 weeks   PLANNED INTERVENTIONS: Therapeutic exercises, Therapeutic activity, Neuromuscular re-education, Balance training, Gait training, Patient/Family education, Stair training, Vestibular training, Prosthetic training, and DME instructions, Physical Performance Testing   PLAN FOR NEXT SESSION:  Progress note next visit, assess getting in and out of home and need for stairs, static balance during standing banded resistance exercise for strengthening and stability during construction 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 Begin consideration of long-term HEP suggestions upon discharge  KJana Hakim SPT 09/12/2021, 12:52 PM  This entire session of physical therapy was performed under the direct supervision of PT signing evaluation /treatment. PT reviewed note and agrees.   RJamey Reas PT, DPT 09/12/2021, 12:53 PM

## 2021-09-13 ENCOUNTER — Ambulatory Visit (INDEPENDENT_AMBULATORY_CARE_PROVIDER_SITE_OTHER): Payer: Medicare Other | Admitting: Physical Therapy

## 2021-09-13 DIAGNOSIS — M6281 Muscle weakness (generalized): Secondary | ICD-10-CM | POA: Diagnosis not present

## 2021-09-13 DIAGNOSIS — M79605 Pain in left leg: Secondary | ICD-10-CM

## 2021-09-13 DIAGNOSIS — E1169 Type 2 diabetes mellitus with other specified complication: Secondary | ICD-10-CM | POA: Diagnosis not present

## 2021-09-13 DIAGNOSIS — R2681 Unsteadiness on feet: Secondary | ICD-10-CM | POA: Diagnosis not present

## 2021-09-13 DIAGNOSIS — Z89519 Acquired absence of unspecified leg below knee: Secondary | ICD-10-CM | POA: Diagnosis not present

## 2021-09-13 DIAGNOSIS — I10 Essential (primary) hypertension: Secondary | ICD-10-CM | POA: Diagnosis not present

## 2021-09-13 DIAGNOSIS — G546 Phantom limb syndrome with pain: Secondary | ICD-10-CM

## 2021-09-13 DIAGNOSIS — I739 Peripheral vascular disease, unspecified: Secondary | ICD-10-CM | POA: Diagnosis not present

## 2021-09-13 DIAGNOSIS — R293 Abnormal posture: Secondary | ICD-10-CM | POA: Diagnosis not present

## 2021-09-13 DIAGNOSIS — R2689 Other abnormalities of gait and mobility: Secondary | ICD-10-CM

## 2021-09-13 NOTE — Therapy (Signed)
OUTPATIENT PHYSICAL THERAPY PROSTHETIC TREATMENT NOTE & PROGRESS NOTE  Patient Name: Ryan [name] MRN: [id number] DOB:[birth year], 71 y.o., male Today's Date: 09/13/2021  PCP: Iona Beard, MD REFERRING PROVIDER: Karoline Caldwell, PA-C  Progress Note Reporting Period 08/11/2021 to 09/13/2021  See note below for Objective Data and Assessment of Progress/Goals.    End of Session:   PT End of Session - 09/13/21 0928     Visit Number 35    Number of Visits 41    Date for PT Re-Evaluation 10/06/21    Authorization Type Medicare A/B & Generic commercial    Progress Note Due on Visit 48    PT Start Time 0930    PT Stop Time 1015    PT Time Calculation (min) 45 min    Equipment Utilized During Treatment Gait belt    Activity Tolerance Patient tolerated treatment well    Behavior During Therapy WFL for tasks assessed/performed             Past Medical History:  Diagnosis Date   COVID 2022   Hypertension    PVD (peripheral vascular disease) ([place])    Past Surgical History:  Procedure Laterality Date   ABDOMINAL AORTAGRAM Left 12/13/2016   ABDOMINAL AORTOGRAM W/LOWER EXTREMITY/notes 12/13/2016   ABDOMINAL AORTOGRAM W/LOWER EXTREMITY N/A 11/28/2016   Procedure: ABDOMINAL AORTOGRAM W/LOWER EXTREMITY;  Surgeon: Adrian Prows, MD;  Location: [place] [facility];  Service: Cardiovascular;  Laterality: N/A;  bilateral   ABDOMINAL AORTOGRAM W/LOWER EXTREMITY N/A 12/13/2016   Procedure: ABDOMINAL AORTOGRAM W/LOWER EXTREMITY;  Surgeon: Waynetta Sandy, MD;  Location: [place] [facility];  Service: Cardiovascular;  Laterality: N/A;  unilater left leg   ABDOMINAL AORTOGRAM W/LOWER EXTREMITY N/A 01/09/2018   Procedure: ABDOMINAL AORTOGRAM W/LOWER EXTREMITY;  Surgeon: Marty Heck, MD;  Location: [place] [facility];  Service: Cardiovascular;  Laterality: N/A;   ABDOMINAL AORTOGRAM W/LOWER EXTREMITY Left 09/22/2019   Procedure: ABDOMINAL AORTOGRAM W/LOWER EXTREMITY;  Surgeon: Waynetta Sandy, MD;  Location: [place] [facility];  Service: Cardiovascular;  Laterality: Left;   ABDOMINAL AORTOGRAM W/LOWER EXTREMITY Left 12/02/2020   Procedure: ABDOMINAL AORTOGRAM W/LOWER EXTREMITY;  Surgeon: Marty Heck, MD;  Location: [facility] [place] [facility];  Service: Cardiovascular;  Laterality: Left;   ABDOMINAL AORTOGRAM W/LOWER EXTREMITY N/A 12/22/2020   Procedure: ABDOMINAL AORTOGRAM W/LOWER EXTREMITY;  Surgeon: Broadus John, MD;  Location: [place] [facility];  Service: Cardiovascular;  Laterality: N/A;   AMPUTATION Left 02/11/2021   Procedure: LEFT ABOVE KNEE AMPUTATION;  Surgeon: Marty Heck, MD;  Location: [place];  Service: Vascular;  Laterality: Left;   BYPASS GRAFT POPLITEAL TO TIBIAL Left 12/31/2020   Procedure: LEFT JUMP GRAFT REVISION BELOW KNEE POPLITEAL TO PERONEAL;  Surgeon: Marty Heck, MD;  Location: [place];  Service: Vascular;  Laterality: Left;   EMBOLIZATION Left 12/03/2020   Procedure: EMBOLIZATION;  Surgeon: Cherre Robins, MD;  Location: [place] [facility];  Service: Cardiovascular;  Laterality: Left;   FEMORAL-POPLITEAL BYPASS GRAFT Left 12/14/2016   Procedure: BYPASS GRAFT COMMON FEMORAL- BELOW KNEE POPLITEAL ARTERY with Bovine Patch to Below the knee poplitieal artery.;  Surgeon: Conrad Iredell, MD;  Location: [facility];  Service: Vascular;  Laterality: Left;   FEMORAL-POPLITEAL BYPASS GRAFT Left 01/28/2018   Procedure: BYPASS GRAFT FEMORAL-BELOW KNEE POPLITEAL ARTERY REDO with propaten vascular graft removable ring;  Surgeon: Marty Heck, MD;  Location: [place];  Service: Vascular;  Laterality: Left;   FEMUR SURGERY Right 2009  pt. has a rod in that leg   LOWER EXTREMITY ANGIOGRAPHY Left 11/28/2016   Procedure: Lower Extremity Angiography;  Surgeon: Adrian Prows, MD;  Location: [place] [facility];  Service: Cardiovascular;  Laterality: Left;  lysis followup lower leg   PATCH ANGIOPLASTY Left 01/28/2018   Procedure: PATCH ANGIOPLASTY  USING Rueben Bash BIOLOGIC PATCH;  Surgeon: Marty Heck, MD;  Location: [place];  Service: Vascular;  Laterality: Left;   PATCH ANGIOPLASTY Left 06/16/2020   Procedure: PATCH ANGIOPLASTY OF LEFT BELOW KNEE [place] ARTERY;  Surgeon: Marty Heck, MD;  Location: [place];  Service: Vascular;  Laterality: Left;   PERIPHERAL VASCULAR BALLOON ANGIOPLASTY  11/28/2016   Procedure: PERIPHERAL VASCULAR BALLOON ANGIOPLASTY;  Surgeon: Adrian Prows, MD;  Location: [place] [facility];  Service: Cardiovascular;;  SFA   PERIPHERAL VASCULAR BALLOON ANGIOPLASTY  12/03/2020   Procedure: PERIPHERAL VASCULAR BALLOON ANGIOPLASTY;  Surgeon: Cherre Robins, MD;  Location: [place] [facility];  Service: Cardiovascular;;  TP Trunk and Peroneal   PERIPHERAL VASCULAR CATHETERIZATION N/A 08/11/2014   Procedure: Lower Extremity Angiography;  Surgeon: Adrian Prows, MD;  Location: [place] [facility];  Service: Cardiovascular;  Laterality: N/A;   PERIPHERAL VASCULAR INTERVENTION Left 09/22/2019   Procedure: PERIPHERAL VASCULAR INTERVENTION;  Surgeon: Waynetta Sandy, MD;  Location: [place] [facility];  Service: Cardiovascular;  Laterality: Left;   PERIPHERAL VASCULAR INTERVENTION Left 09/23/2019   Procedure: PERIPHERAL VASCULAR INTERVENTION;  Surgeon: Serafina Mitchell, MD;  Location: [place] [facility];  Service: Cardiovascular;  Laterality: Left;   PERIPHERAL VASCULAR INTERVENTION Left 05/13/2020   Procedure: PERIPHERAL VASCULAR INTERVENTION;  Surgeon: Marty Heck, MD;  Location: [place] [facility];  Service: Cardiovascular;  Laterality: Left;   PERIPHERAL VASCULAR INTERVENTION Left 12/02/2020   Procedure: PERIPHERAL VASCULAR INTERVENTION;  Surgeon: Marty Heck, MD;  Location: [place] [facility];  Service: Cardiovascular;  Laterality: Left;   PERIPHERAL VASCULAR INTERVENTION  12/03/2020   Procedure: PERIPHERAL VASCULAR INTERVENTION;  Surgeon: Cherre Robins, MD;  Location: [place] [facility];   Service: Cardiovascular;;   PERIPHERAL VASCULAR INTERVENTION Left 12/22/2020   Procedure: PERIPHERAL VASCULAR INTERVENTION;  Surgeon: Broadus John, MD;  Location: [place] [facility];  Service: Cardiovascular;  Laterality: Left;   PERIPHERAL VASCULAR THROMBECTOMY  11/28/2016   Procedure: PERIPHERAL VASCULAR THROMBECTOMY;  Surgeon: Adrian Prows, MD;  Location: [place] [facility];  Service: Cardiovascular;;  Profunda   PERIPHERAL VASCULAR THROMBECTOMY  11/28/2016   Procedure: PERIPHERAL VASCULAR THROMBECTOMY;  Surgeon: Adrian Prows, MD;  Location: [place] [facility];  Service: Cardiovascular;;   PERIPHERAL VASCULAR THROMBECTOMY N/A 05/12/2020   Procedure: PERIPHERAL VASCULAR THROMBECTOMY-Left Leg;  Surgeon: Cherre Robins, MD;  Location: [place] [facility];  Service: Cardiovascular;  Laterality: N/A;   PERIPHERAL VASCULAR THROMBECTOMY N/A 05/13/2020   Procedure: PERIPHERAL VASCULAR THROMBECTOMY- LYSIS RECHECK;  Surgeon: Marty Heck, MD;  Location: [place] [facility];  Service: Cardiovascular;  Laterality: N/A;   PERIPHERAL VASCULAR THROMBECTOMY N/A 12/02/2020   Procedure: PERIPHERAL VASCULAR THROMBECTOMY;  Surgeon: Marty Heck, MD;  Location: [place] [facility];  Service: Cardiovascular;  Laterality: N/A;   PERIPHERAL VASCULAR THROMBECTOMY N/A 12/03/2020   Procedure: LYSIS RECHECK;  Surgeon: Cherre Robins, MD;  Location: [place] [facility];  Service: Cardiovascular;  Laterality: N/A;   PULMONARY THROMBECTOMY N/A 09/23/2019   Procedure: LYSIS RECHECK;  Surgeon: Serafina Mitchell, MD;  Location: [place] [facility];  Service: Cardiovascular;  Laterality: N/A;  THROMBECTOMY FEMORAL ARTERY Left 12/14/2016   Procedure: THROMBECTOMY PROFUNDA FEMORAL ARTERY;  Surgeon: Conrad Crab Orchard, MD;  Location: [place];  Service: Vascular;  Laterality: Left;   THROMBECTOMY FEMORAL ARTERY Left 06/16/2020   Procedure: Redo exposure ot Left below knee popiteal artery;  Surgeon: Marty Heck, MD;   Location: [place];  Service: Vascular;  Laterality: Left;   THROMBECTOMY OF BYPASS GRAFT FEMORAL- POPLITEAL ARTERY Left 06/16/2020   Procedure: THROMBECTOMY OF BYPASS GRAFT FEMORAL-POPLITEAL ARTERY and TIBIAL ARTERY;  Surgeon: Marty Heck, MD;  Location: [place];  Service: Vascular;  Laterality: Left;   THROMBECTOMY OF BYPASS GRAFT FEMORAL- POPLITEAL ARTERY Left 12/31/2020   Procedure: THROMBECTOMY OF LEFT COMMON FEMORAL TO BELOW KNEE POPLITEAL ARTERY BYPASS GRAFT;  Surgeon: Marty Heck, MD;  Location: [place];  Service: Vascular;  Laterality: Left;   VEIN HARVEST Left 12/31/2020   Procedure: Left Greater Saphenous Vein Harvest;  Surgeon: Marty Heck, MD;  Location: [place];  Service: Vascular;  Laterality: Left;   Patient Active Problem List   Diagnosis Date Noted   Peripheral artery disease ([place]) 12/31/2020   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[place]) 05/12/2020   Thrombosis of femoro-popliteal bypass graft ([place]) 05/12/2020   Critical lower limb ischemia ([place]) 12/11/2016   Claudication in peripheral vascular disease ([place]) 11/28/2016   HTN (hypertension) 10/09/2014   Tobacco use 10/09/2014   PAD (peripheral artery disease) ([place])     REFERRING DIAG: 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   PAIN:  Are you having pain?  Yes: NPRS scale: arrived to PT 0/10.  Pain location: distal residual limb Pain description: cramping when he wakes up, soreness walking & aches when sitting with  prosthesis. Aggravating factors: walking with sudden increases in time Relieving factors: wearing prosthesis  OBJECTIVE:    POSTURE: 05/19/21:  rounded shoulders, forward head, flexed trunk , and weight shift right   LE ROM:   ROM P:passive  A:active Right 05/19/2021 Left 05/19/2021 Left 08/11/2021  Hip flexion       Hip extension   -15* standing -5* standing  Hip abduction       Hip adduction       Hip internal rotation       Hip external rotation       Knee flexion       Knee extension       Ankle dorsiflexion       Ankle plantarflexion       Ankle inversion       Ankle eversion        (Blank rows = not tested) MMT:   MMT Right 05/19/2021 Left 05/19/2021 Left 08/11/21  Hip flexion       Hip extension   3/5 Gross functional 4/5 Gross functional  Hip abduction   3/5 Gross functional 4/5 Gross functional  Hip adduction       Hip internal rotation       Hip external rotation       Knee flexion  Knee extension       Ankle dorsiflexion       Ankle plantarflexion       Ankle inversion       Ankle eversion       (Blank rows = not tested)   TRANSFERS: Sit to stand: 05/19/21:  SBA and requires UE assist with armrests & RW touch to stabilize Stand to sit: 05/19/21:  SBA and requires UE assist with armrests & RW stabilization   GAIT: 09/13/2021: Pt amb, neg curb and ramp with rollator and TFA prosthesis modified independent Pt amb household distances with cane SBA  08/10/2021:  Pt neg curb with RW & TFA prosthesis modified independent. Pt neg ramp with RW & TFA prosthesis with verbal cues on hand position on RW to descend. Pt neg stairs with single rail & cane safely modified independent.   08/09/2021: pt amb 516' in 9 min with RW & TFA prosthesis modified independent. Post gait HR 67 SpO2 97%  Gait velocity: 1.10 ft/sec  He had one misstep that he self corrected.    Pattern: step to with decreased right step length, left hip hike.  Good prosthetic foot clearance,  minimal abduction intermittently noted.   05/19/21:  Gait pattern: step to pattern, decreased step length- Right, decreased stance time- Left, decreased hip/knee flexion- Left, circumduction- Left, Left hip hike, antalgic, trunk flexed, abducted- Left, and poor foot clearance- Left  Distance walked: 71' Assistive device utilized: Environmental consultant - 2 wheeled and TFA prosthesis  excessive UE weight bearing Level of assistance: SBA   FUNCTIONAL TESTs:  09/13/2021 TUG with cane stand alone tip:   Std TUG 62.96 sec with SBA, 46.47 sec SBA  Cog TUG 61.59 sec with SBA with pt describing how to build sunroom, able to talk entire time including turns  Manual TUG 57.50 sec SBA carrying open bottle of water with no spills  Cog + Manual TUG 70.93 sec SBA  08/11/2021  TUG with cane stand alone tip:  PT had pt walk one TUG path with minA for warm-up.  Std TUG 95.03 sec with minA  Cog TUG 146.38 sec with minA with pt describing how to build doghouse, able to talk entire time including turns.  Manual TUG 137.65 sec carrying open bottle of water with no spills.  08/09/2021  Berg Balance 34/56 & tasks of Berg with cane support 46/56    Berg Balance Scale: 05/19/21:  17/56   [facility] PT Assessment - 05/19/21 0900                Standardized Balance Assessment    Standardized Balance Assessment Berg Balance Test          Berg Balance Test    Sit to Stand Needs minimal aid to stand or to stabilize     Standing Unsupported Able to stand 2 minutes with supervision     Sitting with Back Unsupported but Feet Supported on Floor or Stool Able to sit safely and securely 2 minutes     Stand to Sit Controls descent by using hands     Transfers Able to transfer safely, definite need of hands     Standing Unsupported with Eyes Closed Unable to keep eyes closed 3 seconds but stays steady     Standing Unsupported with Feet Together Needs help to attain position but able to stand for 30 seconds with feet together     From  Standing, Reach Forward with Outstretched Arm Reaches forward but needs supervision  From Standing Position, Pick up Object from Floor Unable to try/needs assist to keep balance     From Standing Position, Turn to Look Behind Over each Shoulder Needs assist to keep from losing balance and falling     Turn 360 Degrees Needs assistance while turning     Standing Unsupported, Alternately Place Feet on Step/Stool Needs assistance to keep from falling or unable to try     Standing Unsupported, One Foot in [employer] balance while stepping or standing     Standing on One Leg Unable to try or needs assist to prevent fall     Total Score 17     Berg comment: BERG  < 36 high risk for falls (close to 100%) 46-51 moderate (>50%)   37-45 significant (>80%) 52-55 lower (> 25%)                   CURRENT PROSTHETIC WEAR ASSESSMENT: 05/19/21:  Patient is dependent with: skin check, residual limb care, care of non-amputated limb, prosthetic cleaning, ply sock cleaning, correct ply sock adjustment, proper wear schedule/adjustment, and proper weight-bearing schedule/adjustment Donning prosthesis: SBA Doffing prosthesis: Modified independence Prosthetic wear tolerance: 5-6 hours/day, 16 of 16 days since delivery.  Prosthetic weight bearing tolerance: 5 minutes with partial weight on prosthesis & intermittent support with RW Residual limb condition: 2 invaginated areas with one not clean, 1 small area on incision that may be suture working way out, dry skin, cylinderical shape, nonpitting edema,  Prosthetic description: silicon liner with velcro lanyard, ischial containment socket with flexible inner liner, SAFETY knee (single axis nonhydraulic), K2 flexible keel foot    TODAY'S TREATMENT:  09/13/21 Prosthetic Training with Transfemoral Prosthesis: PT educate on indication for tightening suspension strap, pt verbalized understanding and tightened strap Walking around obstacles 5' apart with turns right &  left carrying 2lb weight with cane with supervision, pt recovered one minor loss of balance TUG test with cane as noted in objective - 2x standard TUG, 1x cognitive TUG, 1x manual TUG, 1x cognitive + manual TUG Pt ambulate 120' rollator mod. independent and ascend and descend 8.5" curb (similar to home entrance per pt) with rollator with SBA and no cueing for technique, appears safe to use rollator to navigate tall step to enter and exit home Pt ascend and descend 6 steps total with 3 rail ea. Side - single rail & contralateral UE cane and SBA, verbal cueing for cane placement sequence  09/12/21 Prosthetic Training with Transfemoral Prosthesis: PT educate on indication for tightening suspension strap, pt verbalized understanding and tightened strap Pt ambulate 100' with cane in RUE and supervision, reporting Rt leg fatigue at end of walk Pt ambulate 105' with rollator and supervision including ramp, curb, turn to sit, sit to stand, and carrying food and drink on walker seat. Pt required verbal cueing for locking brakes before ramp negotiation and to walk inside of handles Pt asked about further work at home, PT advised walking at home for "long walk" to moderate claudication sx with walker or rollator 1-2x/day to improve walking aerobic endurance , short walks with cane in house between rooms with high frequency every 30-60 minutes at minimum and medium distance (longer than short walk but not to long walk / max tolerable distance)  at least 5x/day with RW or rollator walker.   Neuro Re-ed: Standing lateral weight shift with cane in Lt hand and tactile and verbal cueing for pelvis motion over prosthesis Standing looking over shoulder x 3 bil.,  visual cueing for target at eye level and lower level  09/07/21 Prosthetic Training with Transfemoral Prosthesis: PT education on walker skis to allow for easier community navigation with verbal and visual picture, pt verbalized understanding PT explained  rollator sizing, braking, and sitting sequence with verbal and visual demo, pt verbalized understanding and showed utilization of brake during walking 53', 20', 40' with 27* Rt and Lt turns with supervision, and sitting x 2 on rollator x 3 on chair PT demo ascending and descending curb and ramp with visual and verbal explanation, pt verbalized understanding and returned demo x 1 ramp and curb with CGA and minor verbal cueing for step negotiation  Pt ambulate 87' with cane in RUE and supervision, reporting leg fatigue and claudication pain   Access Code: [email or web address] URL: [email or web address] Date: 07/04/2021 Prepared by: Jamey Reas  Exercises - Seated Hamstring Stretch with Strap  - 2 x daily - 7 x weekly - 1 sets - 2-3 reps - 30 seconds hold - Modified Deerman Stretch  - 2 x daily - 7 x weekly - 1 sets - 2 reps - 30 seconds hold - Supine Dynamic Modified Goostree Quad and Hip Flexor Dynamic Stretch  - 2 x daily - 7 x weekly - 1 sets - 2 reps - 30 seconds hold - Standing posture with back to counter  - 2 x daily - 7 x weekly - 1 sets - 1 reps - 5-10 minutes hold - Upright Stance at Door Frame Single Arm  - 3-6 x daily - 7 x weekly - 1 sets - 2 reps - 2 deep breathes hold - Upright Stance at Door Frame with Both Arms  - 3-6 x daily - 7 x weekly - 1 sets - 2 reps - 2 deep breathes hold - feet together eyes open on floor  - 1 x daily - 4-7 x weekly - 1 sets - 10 reps - 2 seconds hold - Feet Apart with Eyes Closed with Head Motions  - 1 x daily - 4-7 x weekly - 1 sets - 10 reps - 2 seconds hold - Wide stance on Foam Pad head movements  - 1 x daily - 4-7 x weekly - 1 sets - 10 reps - 2 seconds hold - Standing Quarter Turn with Counter Support  - 1 x daily - 4-7 x weekly - 1 sets - 10 reps - Side Stepping with Counter Support  - 1 x daily - 5-7 x weekly - 1 sets - 10 reps - Backward Walking with Counter Support  - 1 x daily - 5-7 x weekly - 1 sets - 10 reps    05/24/21 1151  PT  Education  Education Details HEP at sink & residual limb pain vs phantom sensation vs phantom pain  Person(s) Educated Patient  Methods Explanation;Demonstration;Tactile cues;Verbal cues;Handout  Comprehension Verbalized understanding;Returned demonstration;Verbal cues required;Tactile cues required;Need further instruction   ASSESSMENT:  CLINICAL IMPRESSION: He has progressed in functional ability and safety during this 10 visit progress note period. He can ambulate household distances including turns and navigating around obstacles and thresholds with supervision, and is able to self-recover most losses of balance. He appears safe to use the rollator for community mobility, including with ramps, curbs, and a tall step to enter and exit his home. All TUG scores have improved and he performed TUG testing today with SBA, indicating a reduced risk of falling compared to beginning of progress period. He has reported no residual limb pain for multiple weeks,  and notes Rt lower limb claudication pain and fatigue with activity. He appears on track to meet prosthesis management goals and mobility goals with either rollator or cane if specified, and will require reassessment of Berg Balance Test to determine if above cut-off for decreased fall risk without device upon discharge.   OBJECTIVE IMPAIRMENTS Abnormal gait, decreased activity tolerance, decreased balance, decreased endurance, decreased knowledge of condition, decreased knowledge of use of DME, decreased mobility, decreased ROM, decreased strength, impaired flexibility, postural dysfunction, prosthetic dependency , and pain.    ACTIVITY LIMITATIONS community activity and occupation.    PERSONAL FACTORS Fitness, Time since onset of injury/illness/exacerbation, and 3+ comorbidities: see PMH  are also affecting patient's functional outcome.    REHAB POTENTIAL: Good   CLINICAL DECISION MAKING: Evolving/moderate complexity   EVALUATION COMPLEXITY:  Moderate     GOALS: Goals reviewed with patient? Yes   SHORT TERM GOALS: Target date: 09/08/2021   Patient verbalizes how to decrease residual limb pain. Goal status: met 09/07/21 2.  Patient tolerates prosthesis >90% awake hrs /day without skin issues or limb pain </= 2/10 >/= 75% of days after standing / walking. Goal status: met 09/07/21   3. Patient ambulates 100' with cane stand alone tip without hand hold assist & prosthesis with min guard. Goal status: met 09/12/21 SBA  LONG TERM GOALS: Target date: 0/06/2692 (reset at re-certification)   Patient demonstrates & verbalized understanding of prosthetic care including issues to address limb pain to enable safe utilization of prosthesis. Baseline: SEE OBJECTIVE DATA Goal status: ongoing 08/09/2021   Patient tolerates prosthesis wear >90% of awake hours without skin or limb pain issues. Baseline: SEE OBJECTIVE DATA Goal status: met 09/13/2021  Merrilee Jansky Balance >/= 45/56 to indicate lower fall risk Baseline: SEE OBJECTIVE DATA Goal status: reset to higher level on 08/11/2021   Patient ambulates >500' with LRAD & prosthesis modified independent. Baseline: SEE OBJECTIVE DATA Goal status: met 08/09/2021 with RW   Patient negotiates ramps, curbs & stairs with single rail with LRAD & prosthesis modified independent. Baseline: SEE OBJECTIVE DATA Goal status: met 08/11/2021 with RW, ramp and curb 09/12/21 with rollator  6.  Patient ambulates 100' with cane & prosthesis modified independent.  Baseline: SEE OBJECTIVE DATA Goal status: NEW 08/11/2021  7. Timed Up & Go with cane stand alone tip modified independent:  Std TUG <60sec, Cog TUG <38mn, Manual TUG <2 min Baseline: SEE OBJECTIVE DATA Goal status: met 09/13/2021   PLAN: PT FREQUENCY: 2x/week   PT DURATION: 8 weeks   PLANNED INTERVENTIONS: Therapeutic exercises, Therapeutic activity, Neuromuscular re-education, Balance training, Gait training, Patient/Family education, Stair training,  Vestibular training, Prosthetic training, and DME instructions, Physical Performance Testing   PLAN FOR NEXT SESSION:  Static balance during standing banded resistance exercise for strengthening and stability during construction 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 Consideration of long-term HEP suggestions upon discharge  KJana Hakim SPT 09/13/2021, 3:37 PM  This entire session of physical therapy was performed under the direct supervision of PT signing evaluation /treatment. PT reviewed note and agrees.  RJamey Reas PT, DPT 09/13/2021, 4:52 PM

## 2021-09-14 ENCOUNTER — Encounter: Payer: Medicare Other | Admitting: Physical Therapy

## 2021-09-19 ENCOUNTER — Ambulatory Visit (INDEPENDENT_AMBULATORY_CARE_PROVIDER_SITE_OTHER): Payer: Medicare Other | Admitting: Physical Therapy

## 2021-09-19 ENCOUNTER — Encounter: Payer: Self-pay | Admitting: Physical Therapy

## 2021-09-19 DIAGNOSIS — M6281 Muscle weakness (generalized): Secondary | ICD-10-CM | POA: Diagnosis not present

## 2021-09-19 DIAGNOSIS — G546 Phantom limb syndrome with pain: Secondary | ICD-10-CM | POA: Diagnosis not present

## 2021-09-19 DIAGNOSIS — R293 Abnormal posture: Secondary | ICD-10-CM | POA: Diagnosis not present

## 2021-09-19 DIAGNOSIS — M79605 Pain in left leg: Secondary | ICD-10-CM

## 2021-09-19 DIAGNOSIS — R2681 Unsteadiness on feet: Secondary | ICD-10-CM

## 2021-09-19 DIAGNOSIS — R2689 Other abnormalities of gait and mobility: Secondary | ICD-10-CM

## 2021-09-19 NOTE — Therapy (Signed)
OUTPATIENT PHYSICAL THERAPY PROSTHETIC TREATMENT NOTE  Patient Name: Ryan [name] MRN: [id number] DOB:October 11, 1950, 71 y.o., male Today's Date: 09/19/2021  PCP: Iona Beard, MD REFERRING PROVIDER: Karoline Caldwell, PA-C   End of Session:   PT End of Session - 09/19/21 0930     Visit Number 36    Number of Visits 41    Date for PT Re-Evaluation 10/06/21    Authorization Type Medicare A/B & Generic commercial    Progress Note Due on Visit 80    PT Start Time 0930    PT Stop Time 1017    PT Time Calculation (min) 47 min    Equipment Utilized During Treatment Gait belt    Activity Tolerance Patient tolerated treatment well    Behavior During Therapy WFL for tasks assessed/performed             Past Medical History:  Diagnosis Date   COVID 2022   Hypertension    PVD (peripheral vascular disease) ([place])    Past Surgical History:  Procedure Laterality Date   ABDOMINAL AORTAGRAM Left 12/13/2016   ABDOMINAL AORTOGRAM W/LOWER EXTREMITY/notes 12/13/2016   ABDOMINAL AORTOGRAM W/LOWER EXTREMITY N/A 11/28/2016   Procedure: ABDOMINAL AORTOGRAM W/LOWER EXTREMITY;  Surgeon: Adrian Prows, MD;  Location: [facility];  Service: Cardiovascular;  Laterality: N/A;  bilateral   ABDOMINAL AORTOGRAM W/LOWER EXTREMITY N/A 12/13/2016   Procedure: ABDOMINAL AORTOGRAM W/LOWER EXTREMITY;  Surgeon: Waynetta Sandy, MD;  Location: [facility];  Service: Cardiovascular;  Laterality: N/A;  unilater left leg   ABDOMINAL AORTOGRAM W/LOWER EXTREMITY N/A 01/09/2018   Procedure: ABDOMINAL AORTOGRAM W/LOWER EXTREMITY;  Surgeon: Marty Heck, MD;  Location: [facility];  Service: Cardiovascular;  Laterality: N/A;   ABDOMINAL AORTOGRAM W/LOWER EXTREMITY Left 09/22/2019   Procedure: ABDOMINAL AORTOGRAM W/LOWER EXTREMITY;  Surgeon: Waynetta Sandy, MD;  Location: [facility];  Service: Cardiovascular;  Laterality: Left;   ABDOMINAL AORTOGRAM W/LOWER EXTREMITY  Left 12/02/2020   Procedure: ABDOMINAL AORTOGRAM W/LOWER EXTREMITY;  Surgeon: Marty Heck, MD;  Location: [facility];  Service: Cardiovascular;  Laterality: Left;   ABDOMINAL AORTOGRAM W/LOWER EXTREMITY N/A 12/22/2020   Procedure: ABDOMINAL AORTOGRAM W/LOWER EXTREMITY;  Surgeon: Broadus John, MD;  Location: [facility];  Service: Cardiovascular;  Laterality: N/A;   AMPUTATION Left 02/11/2021   Procedure: LEFT ABOVE KNEE AMPUTATION;  Surgeon: Marty Heck, MD;  Location: [facility];  Service: Vascular;  Laterality: Left;   BYPASS GRAFT POPLITEAL TO TIBIAL Left 12/31/2020   Procedure: LEFT JUMP GRAFT REVISION BELOW KNEE POPLITEAL TO PERONEAL;  Surgeon: Marty Heck, MD;  Location: [facility];  Service: Vascular;  Laterality: Left;   EMBOLIZATION Left 12/03/2020   Procedure: EMBOLIZATION;  Surgeon: Cherre Robins, MD;  Location: [facility];  Service: Cardiovascular;  Laterality: Left;   FEMORAL-POPLITEAL BYPASS GRAFT Left 12/14/2016   Procedure: BYPASS GRAFT COMMON FEMORAL- BELOW KNEE POPLITEAL ARTERY with Bovine Patch to Below the knee poplitieal artery.;  Surgeon: Conrad [place], MD;  Location: [facility];  Service: Vascular;  Laterality: Left;   FEMORAL-POPLITEAL BYPASS GRAFT Left 01/28/2018   Procedure: BYPASS GRAFT FEMORAL-BELOW KNEE POPLITEAL ARTERY REDO with propaten vascular graft removable ring;  Surgeon: Marty Heck, MD;  Location: [facility];  Service: Vascular;  Laterality: Left;   FEMUR SURGERY Right 2009   pt. has a rod in that leg   LOWER EXTREMITY ANGIOGRAPHY Left 11/28/2016   Procedure: Lower Extremity Angiography;  Surgeon:  Adrian Prows, MD;  Location: [facility];  Service: Cardiovascular;  Laterality: Left;  lysis followup lower leg   PATCH ANGIOPLASTY Left 01/28/2018   Procedure: PATCH ANGIOPLASTY USING Rueben Bash BIOLOGIC PATCH;  Surgeon: Marty Heck, MD;  Location: [place];  Service: Vascular;  Laterality: Left;   PATCH  ANGIOPLASTY Left 06/16/2020   Procedure: PATCH ANGIOPLASTY OF LEFT BELOW KNEE [place] ARTERY;  Surgeon: Marty Heck, MD;  Location: [facility];  Service: Vascular;  Laterality: Left;   PERIPHERAL VASCULAR BALLOON ANGIOPLASTY  11/28/2016   Procedure: PERIPHERAL VASCULAR BALLOON ANGIOPLASTY;  Surgeon: Adrian Prows, MD;  Location: [facility];  Service: Cardiovascular;;  SFA   PERIPHERAL VASCULAR BALLOON ANGIOPLASTY  12/03/2020   Procedure: PERIPHERAL VASCULAR BALLOON ANGIOPLASTY;  Surgeon: Cherre Robins, MD;  Location: [facility];  Service: Cardiovascular;;  TP Trunk and Peroneal   PERIPHERAL VASCULAR CATHETERIZATION N/A 08/11/2014   Procedure: Lower Extremity Angiography;  Surgeon: Adrian Prows, MD;  Location: [facility];  Service: Cardiovascular;  Laterality: N/A;   PERIPHERAL VASCULAR INTERVENTION Left 09/22/2019   Procedure: PERIPHERAL VASCULAR INTERVENTION;  Surgeon: Waynetta Sandy, MD;  Location: [facility];  Service: Cardiovascular;  Laterality: Left;   PERIPHERAL VASCULAR INTERVENTION Left 09/23/2019   Procedure: PERIPHERAL VASCULAR INTERVENTION;  Surgeon: Serafina Mitchell, MD;  Location: [facility];  Service: Cardiovascular;  Laterality: Left;   PERIPHERAL VASCULAR INTERVENTION Left 05/13/2020   Procedure: PERIPHERAL VASCULAR INTERVENTION;  Surgeon: Marty Heck, MD;  Location: [facility];  Service: Cardiovascular;  Laterality: Left;   PERIPHERAL VASCULAR INTERVENTION Left 12/02/2020   Procedure: PERIPHERAL VASCULAR INTERVENTION;  Surgeon: Marty Heck, MD;  Location: [facility];  Service: Cardiovascular;  Laterality: Left;   PERIPHERAL VASCULAR INTERVENTION  12/03/2020   Procedure: PERIPHERAL VASCULAR INTERVENTION;  Surgeon: Cherre Robins, MD;  Location: [facility];  Service: Cardiovascular;;   PERIPHERAL VASCULAR INTERVENTION Left 12/22/2020   Procedure: PERIPHERAL VASCULAR INTERVENTION;  Surgeon:  Broadus John, MD;  Location: [facility];  Service: Cardiovascular;  Laterality: Left;   PERIPHERAL VASCULAR THROMBECTOMY  11/28/2016   Procedure: PERIPHERAL VASCULAR THROMBECTOMY;  Surgeon: Adrian Prows, MD;  Location: [facility];  Service: Cardiovascular;;  Profunda   PERIPHERAL VASCULAR THROMBECTOMY  11/28/2016   Procedure: PERIPHERAL VASCULAR THROMBECTOMY;  Surgeon: Adrian Prows, MD;  Location: [facility];  Service: Cardiovascular;;   PERIPHERAL VASCULAR THROMBECTOMY N/A 05/12/2020   Procedure: PERIPHERAL VASCULAR THROMBECTOMY-Left Leg;  Surgeon: Cherre Robins, MD;  Location: [facility];  Service: Cardiovascular;  Laterality: N/A;   PERIPHERAL VASCULAR THROMBECTOMY N/A 05/13/2020   Procedure: PERIPHERAL VASCULAR THROMBECTOMY- LYSIS RECHECK;  Surgeon: Marty Heck, MD;  Location: [facility];  Service: Cardiovascular;  Laterality: N/A;   PERIPHERAL VASCULAR THROMBECTOMY N/A 12/02/2020   Procedure: PERIPHERAL VASCULAR THROMBECTOMY;  Surgeon: Marty Heck, MD;  Location: [facility];  Service: Cardiovascular;  Laterality: N/A;   PERIPHERAL VASCULAR THROMBECTOMY N/A 12/03/2020   Procedure: LYSIS RECHECK;  Surgeon: Cherre Robins, MD;  Location: [facility];  Service: Cardiovascular;  Laterality: N/A;   PULMONARY THROMBECTOMY N/A 09/23/2019   Procedure: LYSIS RECHECK;  Surgeon: Serafina Mitchell, MD;  Location: [facility];  Service: Cardiovascular;  Laterality: N/A;   THROMBECTOMY FEMORAL ARTERY Left 12/14/2016   Procedure: THROMBECTOMY PROFUNDA FEMORAL ARTERY;  Surgeon: Conrad [place], MD;  Location: [facility];  Service: Vascular;  Laterality: Left;   THROMBECTOMY FEMORAL ARTERY Left 06/16/2020   Procedure: Redo exposure ot Left below knee popiteal artery;  Surgeon: Marty Heck, MD;  [facility];  Service: Vascular;  Laterality: Left;   THROMBECTOMY OF BYPASS GRAFT FEMORAL- POPLITEAL ARTERY Left 06/16/2020    Procedure: THROMBECTOMY OF BYPASS GRAFT FEMORAL-POPLITEAL ARTERY and TIBIAL ARTERY;  Surgeon: Marty Heck, MD;  Location: [facility];  Service: Vascular;  Laterality: Left;   THROMBECTOMY OF BYPASS GRAFT FEMORAL- POPLITEAL ARTERY Left 12/31/2020   Procedure: THROMBECTOMY OF LEFT COMMON FEMORAL TO BELOW KNEE POPLITEAL ARTERY BYPASS GRAFT;  Surgeon: Marty Heck, MD;  Location: [place];  Service: Vascular;  Laterality: Left;   VEIN HARVEST Left 12/31/2020   Procedure: Left Greater Saphenous Vein Harvest;  Surgeon: Marty Heck, MD;  Location: [place];  Service: Vascular;  Laterality: Left;   Patient Active Problem List   Diagnosis Date Noted   Peripheral artery disease ([place]) 12/31/2020   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[place]) 05/12/2020   Thrombosis of femoro-popliteal bypass graft ([place]) 05/12/2020   Critical lower limb ischemia ([place]) 12/11/2016   Claudication in peripheral vascular disease ([place]) 11/28/2016   HTN (hypertension) 10/09/2014   Tobacco use 10/09/2014   PAD (peripheral artery disease) ([place])     REFERRING DIAG: V40.981 Left Transfemoral Amputation, I73.9 PAD  ONSET DATE: 05/03/2021 prosthesis delivery  THERAPY DIAG:  Unsteadiness on feet  Muscle weakness (generalized)  Abnormal posture  Other abnormalities of gait and mobility  Phantom limb syndrome with pain ([place])  Pain in left leg  PERTINENT HISTORY: left TFA, HTN, PVD, Covid, ORIF left femur 2009  PRECAUTIONS: Fall  SUBJECTIVE: He hadn't used the rollator at home yet. He has trouble walking back down his porch with the cane after 42' out and turning, with columns on the Lt side when he is on the way back. He reports the prosthesis feels heavy while walking and he has reduced weight shifting on the prosthesis.  PAIN:  Are you having pain?  Yes: NPRS scale: arrived to PT 0/10.  Pain location: distal residual limb Pain description: cramping when he wakes up, soreness walking & aches when sitting  with prosthesis. Aggravating factors: walking with sudden increases in time Relieving factors: wearing prosthesis  OBJECTIVE:    POSTURE: 05/19/21:  rounded shoulders, forward head, flexed trunk , and weight shift right   LE ROM:   ROM P:passive  A:active Right 05/19/2021 Left 05/19/2021 Left 08/11/2021  Hip flexion       Hip extension   -15* standing -5* standing  Hip abduction       Hip adduction       Hip internal rotation       Hip external rotation       Knee flexion       Knee extension       Ankle dorsiflexion       Ankle plantarflexion       Ankle inversion       Ankle eversion        (Blank rows = not tested) MMT:   MMT Right 05/19/2021 Left 05/19/2021 Left 08/11/21  Hip flexion       Hip extension   3/5 Gross functional 4/5 Gross functional  Hip abduction   3/5 Gross functional 4/5 Gross functional  Hip adduction       Hip internal rotation       Hip external rotation  Knee flexion       Knee extension       Ankle dorsiflexion       Ankle plantarflexion       Ankle inversion       Ankle eversion       (Blank rows = not tested)   TRANSFERS: Sit to stand: 05/19/21:  SBA and requires UE assist with armrests & RW touch to stabilize Stand to sit: 05/19/21:  SBA and requires UE assist with armrests & RW stabilization   GAIT: 09/13/2021: Pt amb, neg curb and ramp with rollator and TFA prosthesis modified independent Pt amb household distances with cane SBA  08/10/2021:  Pt neg curb with RW & TFA prosthesis modified independent. Pt neg ramp with RW & TFA prosthesis with verbal cues on hand position on RW to descend. Pt neg stairs with single rail & cane safely modified independent.   08/09/2021: pt amb 516' in 9 min with RW & TFA prosthesis modified independent. Post gait HR 67 SpO2 97%  Gait velocity: 1.10 ft/sec  He had one misstep that he self corrected.    Pattern: step to with decreased right step length, left hip hike.  Good prosthetic foot  clearance, minimal abduction intermittently noted.   05/19/21:  Gait pattern: step to pattern, decreased step length- Right, decreased stance time- Left, decreased hip/knee flexion- Left, circumduction- Left, Left hip hike, antalgic, trunk flexed, abducted- Left, and poor foot clearance- Left  Distance walked: 19' Assistive device utilized: Environmental consultant - 2 wheeled and TFA prosthesis  excessive UE weight bearing Level of assistance: SBA   FUNCTIONAL TESTs:  09/13/2021 TUG with cane stand alone tip:   Std TUG 62.96 sec with SBA, 46.47 sec SBA  Cog TUG 61.59 sec with SBA with pt describing how to build sunroom, able to talk entire time including turns  Manual TUG 57.50 sec SBA carrying open bottle of water with no spills  Cog + Manual TUG 70.93 sec SBA  08/11/2021  TUG with cane stand alone tip:  PT had pt walk one TUG path with minA for warm-up.  Std TUG 95.03 sec with minA  Cog TUG 146.38 sec with minA with pt describing how to build doghouse, able to talk entire time including turns.  Manual TUG 137.65 sec carrying open bottle of water with no spills.  08/09/2021  Berg Balance 34/56 & tasks of Berg with cane support 46/56    Berg Balance Scale: 05/19/21:  17/56   [facility] PT Assessment - 05/19/21 0900                Standardized Balance Assessment    Standardized Balance Assessment Berg Balance Test          Berg Balance Test    Sit to Stand Needs minimal aid to stand or to stabilize     Standing Unsupported Able to stand 2 minutes with supervision     Sitting with Back Unsupported but Feet Supported on Floor or Stool Able to sit safely and securely 2 minutes     Stand to Sit Controls descent by using hands     Transfers Able to transfer safely, definite need of hands     Standing Unsupported with Eyes Closed Unable to keep eyes closed 3 seconds but stays steady     Standing Unsupported with Feet Together Needs help to attain position but able to stand for 30 seconds with feet together      From Standing, Reach Forward with Outstretched  Arm Reaches forward but needs supervision     From Standing Position, Pick up Object from Floor Unable to try/needs assist to keep balance     From Standing Position, Turn to Look Behind Over each Shoulder Needs assist to keep from losing balance and falling     Turn 360 Degrees Needs assistance while turning     Standing Unsupported, Alternately Place Feet on Step/Stool Needs assistance to keep from falling or unable to try     Standing Unsupported, One Foot in [employer] balance while stepping or standing     Standing on One Leg Unable to try or needs assist to prevent fall     Total Score 17     Berg comment: BERG  < 36 high risk for falls (close to 100%) 46-51 moderate (>50%)   37-45 significant (>80%) 52-55 lower (> 25%)                   CURRENT PROSTHETIC WEAR ASSESSMENT: 05/19/21:  Patient is dependent with: skin check, residual limb care, care of non-amputated limb, prosthetic cleaning, ply sock cleaning, correct ply sock adjustment, proper wear schedule/adjustment, and proper weight-bearing schedule/adjustment Donning prosthesis: SBA Doffing prosthesis: Modified independence Prosthetic wear tolerance: 5-6 hours/day, 16 of 16 days since delivery.  Prosthetic weight bearing tolerance: 5 minutes with partial weight on prosthesis & intermittent support with RW Residual limb condition: 2 invaginated areas with one not clean, 1 small area on incision that may be suture working way out, dry skin, cylinderical shape, nonpitting edema,  Prosthetic description: silicon liner with velcro lanyard, ischial containment socket with flexible inner liner, SAFETY knee (single axis nonhydraulic), K2 flexible keel foot    TODAY'S TREATMENT:  09/19/21 Prosthetic Training with Transfemoral Prosthesis: PT education on checking strap when prosthesis feels heavier than usual and demo checking strap, pt verbalized understanding PT suggestion for static  stance with cane in Lt hand for increased WB on prosthesis, pt returned demo x 1 min and verbalized understanding Pt ambulate 34f with cane in RUE and supervision, pt slowed speed to increase weight shift on prosthesis and spending more even time WB bil.  Neuro Re-ed:  Green band, no UE assist and cueing for controlled eccentric throughout  Alternating rows x 10, bil. rows x 10 reps  Alternating shoulder extension with elbows ext. x 10, bil. x 10 reps  Shoulder ER and horizontal abduction x 10 ea. UE Red band in LUE and cane in RUE, cueing for controlled eccentric   Rt side stepping x 2, Lt side stepping x 1, forward x 1, backward x 1. Pt self-recovered 2 instances of prosthetic knee bucking and corrected for knee extension with side stepping  09/13/21 Prosthetic Training with Transfemoral Prosthesis: PT educate on indication for tightening suspension strap, pt verbalized understanding and tightened strap Walking around obstacles 5' apart with turns right & left carrying 2lb weight with cane with supervision, pt recovered one minor loss of balance TUG test with cane as noted in objective - 2x standard TUG, 1x cognitive TUG, 1x manual TUG, 1x cognitive + manual TUG Pt ambulate 120' rollator mod. independent and ascend and descend 8.5" curb (similar to home entrance per pt) with rollator with SBA and no cueing for technique, appears safe to use rollator to navigate tall step to enter and exit home Pt ascend and descend 6 steps total with 3 rail ea. Side - single rail & contralateral UE cane and SBA, verbal cueing for cane placement sequence  09/12/21 Prosthetic Training with Transfemoral Prosthesis: PT educate on indication for tightening suspension strap, pt verbalized understanding and tightened strap Pt ambulate 100' with cane in RUE and supervision, reporting Rt leg fatigue at end of walk Pt ambulate 105' with rollator and supervision including ramp, curb, turn to sit, sit to stand, and  carrying food and drink on walker seat. Pt required verbal cueing for locking brakes before ramp negotiation and to walk inside of handles Pt asked about further work at home, PT advised walking at home for "long walk" to moderate claudication sx with walker or rollator 1-2x/day to improve walking aerobic endurance , short walks with cane in house between rooms with high frequency every 30-60 minutes at minimum and medium distance (longer than short walk but not to long walk / max tolerable distance)  at least 5x/day with RW or rollator walker.   Neuro Re-ed: Standing lateral weight shift with cane in Lt hand and tactile and verbal cueing for pelvis motion over prosthesis Standing looking over shoulder x 3 bil., visual cueing for target at eye level and lower level  HOME EXERCISE PROGRAM Access Code: [email or web address] URL: [email or web address] Date: 07/04/2021 Prepared by: Jamey Reas  Exercises - Seated Hamstring Stretch with Strap  - 2 x daily - 7 x weekly - 1 sets - 2-3 reps - 30 seconds hold - Modified Wunder Stretch  - 2 x daily - 7 x weekly - 1 sets - 2 reps - 30 seconds hold - Supine Dynamic Modified Cardell Quad and Hip Flexor Dynamic Stretch  - 2 x daily - 7 x weekly - 1 sets - 2 reps - 30 seconds hold - Standing posture with back to counter  - 2 x daily - 7 x weekly - 1 sets - 1 reps - 5-10 minutes hold - Upright Stance at Door Frame Single Arm  - 3-6 x daily - 7 x weekly - 1 sets - 2 reps - 2 deep breathes hold - Upright Stance at Door Frame with Both Arms  - 3-6 x daily - 7 x weekly - 1 sets - 2 reps - 2 deep breathes hold - feet together eyes open on floor  - 1 x daily - 4-7 x weekly - 1 sets - 10 reps - 2 seconds hold - Feet Apart with Eyes Closed with Head Motions  - 1 x daily - 4-7 x weekly - 1 sets - 10 reps - 2 seconds hold - Wide stance on Foam Pad head movements  - 1 x daily - 4-7 x weekly - 1 sets - 10 reps - 2 seconds hold - Standing Quarter Turn with Counter  Support  - 1 x daily - 4-7 x weekly - 1 sets - 10 reps - Side Stepping with Counter Support  - 1 x daily - 5-7 x weekly - 1 sets - 10 reps - Backward Walking with Counter Support  - 1 x daily - 5-7 x weekly - 1 sets - 10 reps    05/24/21 1151  PT Education  Education Details HEP at sink & residual limb pain vs phantom sensation vs phantom pain  Person(s) Educated Patient  Methods Explanation;Demonstration;Tactile cues;Verbal cues;Handout  Comprehension Verbalized understanding;Returned demonstration;Verbal cues required;Tactile cues required;Need further instruction   ASSESSMENT:  CLINICAL IMPRESSION: He reported difficulty in walking on the segment of his porch where he doesn't have consistent support under LUE, so he will benefit from increased confidence in household mobility tasks with the cane. He tolerated resisted balance  exercises well today, with the need for some seated breaks and moderate RLE claudication pain. He was advised to begin walking in home and community with rollator and should be reinforced prior to discharge from PT. He continues to benefit from skilled PT.   OBJECTIVE IMPAIRMENTS Abnormal gait, decreased activity tolerance, decreased balance, decreased endurance, decreased knowledge of condition, decreased knowledge of use of DME, decreased mobility, decreased ROM, decreased strength, impaired flexibility, postural dysfunction, prosthetic dependency , and pain.    ACTIVITY LIMITATIONS community activity and occupation.    PERSONAL FACTORS Fitness, Time since onset of injury/illness/exacerbation, and 3+ comorbidities: see PMH  are also affecting patient's functional outcome.    REHAB POTENTIAL: Good   CLINICAL DECISION MAKING: Evolving/moderate complexity   EVALUATION COMPLEXITY: Moderate     GOALS: Goals reviewed with patient? Yes   SHORT TERM GOALS: Target date: 09/08/2021   Patient verbalizes how to decrease residual limb pain. Goal status: met 09/07/21 2.   Patient tolerates prosthesis >90% awake hrs /day without skin issues or limb pain </= 2/10 >/= 75% of days after standing / walking. Goal status: met 09/07/21   3. Patient ambulates 100' with cane stand alone tip without hand hold assist & prosthesis with min guard. Goal status: met 09/12/21 SBA  LONG TERM GOALS: Target date: 05/06/5679 (reset at re-certification)   Patient demonstrates & verbalized understanding of prosthetic care including issues to address limb pain to enable safe utilization of prosthesis. Baseline: SEE OBJECTIVE DATA Goal status: ongoing 08/09/2021   Patient tolerates prosthesis wear >90% of awake hours without skin or limb pain issues. Baseline: SEE OBJECTIVE DATA Goal status: met 09/13/2021  Merrilee Jansky Balance >/= 45/56 to indicate lower fall risk Baseline: SEE OBJECTIVE DATA Goal status: reset to higher level on 08/11/2021   Patient ambulates >500' with LRAD & prosthesis modified independent. Baseline: SEE OBJECTIVE DATA Goal status: met 08/09/2021 with RW   Patient negotiates ramps, curbs & stairs with single rail with LRAD & prosthesis modified independent. Baseline: SEE OBJECTIVE DATA Goal status: met 08/11/2021 with RW, ramp and curb 09/12/21 with rollator  6.  Patient ambulates 100' with cane & prosthesis modified independent.  Baseline: SEE OBJECTIVE DATA Goal status: NEW 08/11/2021  7. Timed Up & Go with cane stand alone tip modified independent:  Std TUG <60sec, Cog TUG <75mn, Manual TUG <2 min Baseline: SEE OBJECTIVE DATA Goal status: met 09/13/2021   PLAN: PT FREQUENCY: 2x/week   PT DURATION: 8 weeks   PLANNED INTERVENTIONS: Therapeutic exercises, Therapeutic activity, Neuromuscular re-education, Balance training, Gait training, Patient/Family education, Stair training, Vestibular training, Prosthetic training, and DME instructions, Physical Performance Testing   PLAN FOR NEXT SESSION:  Continue household gait with cane including change in direction and  cognitive load. 40' straight walking with cane and no UE assist to improve confidence in home gait,  Consideration of long-term HEP suggestions upon discharge  KJana Hakim Student-PT 09/19/2021, 1:19 PM  This entire session of physical therapy was performed under the direct supervision of PT signing evaluation /treatment. PT reviewed note and agrees.  RJamey Reas PT, DPT 09/19/2021, 1:21 PM

## 2021-09-20 ENCOUNTER — Ambulatory Visit (INDEPENDENT_AMBULATORY_CARE_PROVIDER_SITE_OTHER): Payer: Medicare Other | Admitting: Physical Therapy

## 2021-09-20 ENCOUNTER — Encounter: Payer: Self-pay | Admitting: Physical Therapy

## 2021-09-20 DIAGNOSIS — M79605 Pain in left leg: Secondary | ICD-10-CM

## 2021-09-20 DIAGNOSIS — R2681 Unsteadiness on feet: Secondary | ICD-10-CM | POA: Diagnosis not present

## 2021-09-20 DIAGNOSIS — M6281 Muscle weakness (generalized): Secondary | ICD-10-CM

## 2021-09-20 DIAGNOSIS — R2689 Other abnormalities of gait and mobility: Secondary | ICD-10-CM

## 2021-09-20 DIAGNOSIS — G546 Phantom limb syndrome with pain: Secondary | ICD-10-CM | POA: Diagnosis not present

## 2021-09-20 DIAGNOSIS — R293 Abnormal posture: Secondary | ICD-10-CM | POA: Diagnosis not present

## 2021-09-20 NOTE — Therapy (Signed)
OUTPATIENT PHYSICAL THERAPY PROSTHETIC TREATMENT NOTE  Patient Name: Ryan [name] MRN: [id number] DOB:October 18, 1950, 71 y.o., male Today's Date: 09/20/2021  PCP: Iona Beard, MD REFERRING PROVIDER: Karoline Caldwell, PA-C   End of Session:   PT End of Session - 09/20/21 0927     Visit Number 37    Number of Visits 41    Date for PT Re-Evaluation 10/06/21    Authorization Type Medicare A/B & Generic commercial    Progress Note Due on Visit 27    PT Start Time 0930    PT Stop Time 1020    PT Time Calculation (min) 50 min    Equipment Utilized During Treatment Gait belt    Activity Tolerance Patient tolerated treatment well    Behavior During Therapy WFL for tasks assessed/performed              Past Medical History:  Diagnosis Date   COVID 2022   Hypertension    PVD (peripheral vascular disease) ([place])    Past Surgical History:  Procedure Laterality Date   ABDOMINAL AORTAGRAM Left 12/13/2016   ABDOMINAL AORTOGRAM W/LOWER EXTREMITY/notes 12/13/2016   ABDOMINAL AORTOGRAM W/LOWER EXTREMITY N/A 11/28/2016   Procedure: ABDOMINAL AORTOGRAM W/LOWER EXTREMITY;  Surgeon: Adrian Prows, MD;  Location: [facility];  Service: Cardiovascular;  Laterality: N/A;  bilateral   ABDOMINAL AORTOGRAM W/LOWER EXTREMITY N/A 12/13/2016   Procedure: ABDOMINAL AORTOGRAM W/LOWER EXTREMITY;  Surgeon: Waynetta Sandy, MD;  Location: [facility];  Service: Cardiovascular;  Laterality: N/A;  unilater left leg   ABDOMINAL AORTOGRAM W/LOWER EXTREMITY N/A 01/09/2018   Procedure: ABDOMINAL AORTOGRAM W/LOWER EXTREMITY;  Surgeon: Marty Heck, MD;  Location: [facility];  Service: Cardiovascular;  Laterality: N/A;   ABDOMINAL AORTOGRAM W/LOWER EXTREMITY Left 09/22/2019   Procedure: ABDOMINAL AORTOGRAM W/LOWER EXTREMITY;  Surgeon: Waynetta Sandy, MD;  Location: [facility];  Service: Cardiovascular;  Laterality: Left;   ABDOMINAL AORTOGRAM W/LOWER EXTREMITY  Left 12/02/2020   Procedure: ABDOMINAL AORTOGRAM W/LOWER EXTREMITY;  Surgeon: Marty Heck, MD;  Location: [facility];  Service: Cardiovascular;  Laterality: Left;   ABDOMINAL AORTOGRAM W/LOWER EXTREMITY N/A 12/22/2020   Procedure: ABDOMINAL AORTOGRAM W/LOWER EXTREMITY;  Surgeon: Broadus John, MD;  Location: [facility];  Service: Cardiovascular;  Laterality: N/A;   AMPUTATION Left 02/11/2021   Procedure: LEFT ABOVE KNEE AMPUTATION;  Surgeon: Marty Heck, MD;  Location: [place] [facility];  Service: Vascular;  Laterality: Left;   BYPASS GRAFT POPLITEAL TO TIBIAL Left 12/31/2020   Procedure: LEFT JUMP GRAFT REVISION BELOW KNEE POPLITEAL TO PERONEAL;  Surgeon: Marty Heck, MD;  Location: [place];  Service: Vascular;  Laterality: Left;   EMBOLIZATION Left 12/03/2020   Procedure: EMBOLIZATION;  Surgeon: Cherre Robins, MD;  Location: [facility];  Service: Cardiovascular;  Laterality: Left;   FEMORAL-POPLITEAL BYPASS GRAFT Left 12/14/2016   Procedure: BYPASS GRAFT COMMON FEMORAL- BELOW KNEE POPLITEAL ARTERY with Bovine Patch to Below the knee poplitieal artery.;  Surgeon: Conrad [place] Hills, MD;  Location: [facility];  Service: Vascular;  Laterality: Left;   FEMORAL-POPLITEAL BYPASS GRAFT Left 01/28/2018   Procedure: BYPASS GRAFT FEMORAL-BELOW KNEE POPLITEAL ARTERY REDO with propaten vascular graft removable ring;  Surgeon: Marty Heck, MD;  Location: [place];  Service: Vascular;  Laterality: Left;   FEMUR SURGERY Right 2009   pt. has a rod in that leg   LOWER EXTREMITY ANGIOGRAPHY Left 11/28/2016   Procedure: Lower Extremity Angiography;  Surgeon: Adrian Prows, MD;  Location: [facility];  Service: Cardiovascular;  Laterality: Left;  lysis followup lower leg   PATCH ANGIOPLASTY Left 01/28/2018   Procedure: PATCH ANGIOPLASTY USING Rueben Bash BIOLOGIC PATCH;  Surgeon: Marty Heck, MD;  Location: [place];  Service: Vascular;  Laterality: Left;   PATCH  ANGIOPLASTY Left 06/16/2020   Procedure: PATCH ANGIOPLASTY OF LEFT BELOW KNEE [place] ARTERY;  Surgeon: Marty Heck, MD;  Location: [place];  Service: Vascular;  Laterality: Left;   PERIPHERAL VASCULAR BALLOON ANGIOPLASTY  11/28/2016   Procedure: PERIPHERAL VASCULAR BALLOON ANGIOPLASTY;  Surgeon: Adrian Prows, MD;  Location: [facility];  Service: Cardiovascular;;  SFA   PERIPHERAL VASCULAR BALLOON ANGIOPLASTY  12/03/2020   Procedure: PERIPHERAL VASCULAR BALLOON ANGIOPLASTY;  Surgeon: Cherre Robins, MD;  Location: [facility];  Service: Cardiovascular;;  TP Trunk and Peroneal   PERIPHERAL VASCULAR CATHETERIZATION N/A 08/11/2014   Procedure: Lower Extremity Angiography;  Surgeon: Adrian Prows, MD;  Location: [facility];  Service: Cardiovascular;  Laterality: N/A;   PERIPHERAL VASCULAR INTERVENTION Left 09/22/2019   Procedure: PERIPHERAL VASCULAR INTERVENTION;  Surgeon: Waynetta Sandy, MD;  Location: [facility];  Service: Cardiovascular;  Laterality: Left;   PERIPHERAL VASCULAR INTERVENTION Left 09/23/2019   Procedure: PERIPHERAL VASCULAR INTERVENTION;  Surgeon: Serafina Mitchell, MD;  Location: [facility];  Service: Cardiovascular;  Laterality: Left;   PERIPHERAL VASCULAR INTERVENTION Left 05/13/2020   Procedure: PERIPHERAL VASCULAR INTERVENTION;  Surgeon: Marty Heck, MD;  Location: [facility];  Service: Cardiovascular;  Laterality: Left;   PERIPHERAL VASCULAR INTERVENTION Left 12/02/2020   Procedure: PERIPHERAL VASCULAR INTERVENTION;  Surgeon: Marty Heck, MD;  Location: [facility];  Service: Cardiovascular;  Laterality: Left;   PERIPHERAL VASCULAR INTERVENTION  12/03/2020   Procedure: PERIPHERAL VASCULAR INTERVENTION;  Surgeon: Cherre Robins, MD;  Location: [facility];  Service: Cardiovascular;;   PERIPHERAL VASCULAR INTERVENTION Left 12/22/2020   Procedure: PERIPHERAL VASCULAR INTERVENTION;  Surgeon:  Broadus John, MD;  Location: [facility];  Service: Cardiovascular;  Laterality: Left;   PERIPHERAL VASCULAR THROMBECTOMY  11/28/2016   Procedure: PERIPHERAL VASCULAR THROMBECTOMY;  Surgeon: Adrian Prows, MD;  Location: [facility];  Service: Cardiovascular;;  Profunda   PERIPHERAL VASCULAR THROMBECTOMY  11/28/2016   Procedure: PERIPHERAL VASCULAR THROMBECTOMY;  Surgeon: Adrian Prows, MD;  Location: [facility];  Service: Cardiovascular;;   PERIPHERAL VASCULAR THROMBECTOMY N/A 05/12/2020   Procedure: PERIPHERAL VASCULAR THROMBECTOMY-Left Leg;  Surgeon: Cherre Robins, MD;  Location: [facility];  Service: Cardiovascular;  Laterality: N/A;   PERIPHERAL VASCULAR THROMBECTOMY N/A 05/13/2020   Procedure: PERIPHERAL VASCULAR THROMBECTOMY- LYSIS RECHECK;  Surgeon: Marty Heck, MD;  Location: [facility];  Service: Cardiovascular;  Laterality: N/A;   PERIPHERAL VASCULAR THROMBECTOMY N/A 12/02/2020   Procedure: PERIPHERAL VASCULAR THROMBECTOMY;  Surgeon: Marty Heck, MD;  Location: [facility];  Service: Cardiovascular;  Laterality: N/A;   PERIPHERAL VASCULAR THROMBECTOMY N/A 12/03/2020   Procedure: LYSIS RECHECK;  Surgeon: Cherre Robins, MD;  Location: [facility];  Service: Cardiovascular;  Laterality: N/A;   PULMONARY THROMBECTOMY N/A 09/23/2019   Procedure: LYSIS RECHECK;  Surgeon: Serafina Mitchell, MD;  Location: [facility];  Service: Cardiovascular;  Laterality: N/A;   THROMBECTOMY FEMORAL ARTERY Left 12/14/2016   Procedure: THROMBECTOMY PROFUNDA FEMORAL ARTERY;  Surgeon: Conrad Wilson, MD;  Location: [facility]  OR;  Service: Vascular;  Laterality: Left;   THROMBECTOMY FEMORAL ARTERY Left 06/16/2020   Procedure: Redo exposure ot Left below knee popiteal artery;  Surgeon: Marty Heck, MD;  Location: [facility] OR;  Service: Vascular;  Laterality: Left;   THROMBECTOMY OF BYPASS GRAFT FEMORAL- POPLITEAL ARTERY Left 06/16/2020    Procedure: THROMBECTOMY OF BYPASS GRAFT FEMORAL-POPLITEAL ARTERY and TIBIAL ARTERY;  Surgeon: Marty Heck, MD;  Location: [place];  Service: Vascular;  Laterality: Left;   THROMBECTOMY OF BYPASS GRAFT FEMORAL- POPLITEAL ARTERY Left 12/31/2020   Procedure: THROMBECTOMY OF LEFT COMMON FEMORAL TO BELOW KNEE POPLITEAL ARTERY BYPASS GRAFT;  Surgeon: Marty Heck, MD;  Location: [place];  Service: Vascular;  Laterality: Left;   VEIN HARVEST Left 12/31/2020   Procedure: Left Greater Saphenous Vein Harvest;  Surgeon: Marty Heck, MD;  Location: [place];  Service: Vascular;  Laterality: Left;   Patient Active Problem List   Diagnosis Date Noted   Peripheral artery disease ([place]) 12/31/2020   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[place]) 05/12/2020   Thrombosis of femoro-popliteal bypass graft ([place]) 05/12/2020   Critical lower limb ischemia ([place]) 12/11/2016   Claudication in peripheral vascular disease ([place]) 11/28/2016   HTN (hypertension) 10/09/2014   Tobacco use 10/09/2014   PAD (peripheral artery disease) ([place])     REFERRING DIAG: 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 He tried walking with the cane in the LUE and reported immense difficulty with that.  PAIN:  Are you having pain?  Yes: NPRS scale: arrived to PT 0/10.  Pain location: distal residual limb Pain description: cramping when he wakes up, soreness walking & aches when sitting with prosthesis. Aggravating factors: walking with sudden increases in time Relieving factors: wearing prosthesis  OBJECTIVE:     POSTURE: 05/19/21:  rounded shoulders, forward head, flexed trunk , and weight shift right   LE ROM:   ROM P:passive  A:active Right 05/19/2021 Left 05/19/2021 Left 08/11/2021  Hip flexion       Hip extension   -15* standing -5* standing  Hip abduction       Hip adduction       Hip internal rotation       Hip external rotation       Knee flexion       Knee extension       Ankle dorsiflexion       Ankle plantarflexion       Ankle inversion       Ankle eversion        (Blank rows = not tested) MMT:   MMT Right 05/19/2021 Left 05/19/2021 Left 08/11/21  Hip flexion       Hip extension   3/5 Gross functional 4/5 Gross functional  Hip abduction   3/5 Gross functional 4/5 Gross functional  Hip adduction       Hip internal rotation       Hip external rotation       Knee flexion       Knee extension       Ankle dorsiflexion       Ankle plantarflexion  Ankle inversion       Ankle eversion       (Blank rows = not tested)   TRANSFERS: Sit to stand: 05/19/21:  SBA and requires UE assist with armrests & RW touch to stabilize Stand to sit: 05/19/21:  SBA and requires UE assist with armrests & RW stabilization   GAIT: 09/13/2021: Pt amb, neg curb and ramp with rollator and TFA prosthesis modified independent Pt amb household distances with cane SBA  08/10/2021:  Pt neg curb with RW & TFA prosthesis modified independent. Pt neg ramp with RW & TFA prosthesis with verbal cues on hand position on RW to descend. Pt neg stairs with single rail & cane safely modified independent.   08/09/2021: pt amb 516' in 9 min with RW & TFA prosthesis modified independent. Post gait HR 67 SpO2 97%  Gait velocity: 1.10 ft/sec  He had one misstep that he self corrected.    Pattern: step to with decreased right step length, left hip hike.  Good prosthetic foot clearance, minimal abduction intermittently noted.   05/19/21:  Gait pattern: step to pattern, decreased step length- Right, decreased  stance time- Left, decreased hip/knee flexion- Left, circumduction- Left, Left hip hike, antalgic, trunk flexed, abducted- Left, and poor foot clearance- Left  Distance walked: 23' Assistive device utilized: Environmental consultant - 2 wheeled and TFA prosthesis  excessive UE weight bearing Level of assistance: SBA   FUNCTIONAL TESTs:  09/13/2021 TUG with cane stand alone tip:   Std TUG 62.96 sec with SBA, 46.47 sec SBA  Cog TUG 61.59 sec with SBA with pt describing how to build sunroom, able to talk entire time including turns  Manual TUG 57.50 sec SBA carrying open bottle of water with no spills  Cog + Manual TUG 70.93 sec SBA  08/11/2021  TUG with cane stand alone tip:  PT had pt walk one TUG path with minA for warm-up.  Std TUG 95.03 sec with minA  Cog TUG 146.38 sec with minA with pt describing how to build doghouse, able to talk entire time including turns.  Manual TUG 137.65 sec carrying open bottle of water with no spills.  08/09/2021  Berg Balance 34/56 & tasks of Berg with cane support 46/56    Berg Balance Scale: 05/19/21:  17/56   [facility] PT Assessment - 05/19/21 0900                Standardized Balance Assessment    Standardized Balance Assessment Berg Balance Test          Berg Balance Test    Sit to Stand Needs minimal aid to stand or to stabilize     Standing Unsupported Able to stand 2 minutes with supervision     Sitting with Back Unsupported but Feet Supported on Floor or Stool Able to sit safely and securely 2 minutes     Stand to Sit Controls descent by using hands     Transfers Able to transfer safely, definite need of hands     Standing Unsupported with Eyes Closed Unable to keep eyes closed 3 seconds but stays steady     Standing Unsupported with Feet Together Needs help to attain position but able to stand for 30 seconds with feet together     From Standing, Reach Forward with Outstretched Arm Reaches forward but needs supervision     From Standing Position, Pick up Object  from Floor Unable to try/needs assist to keep balance     From Standing Position,  Turn to Look Behind Over each Shoulder Needs assist to keep from losing balance and falling     Turn 360 Degrees Needs assistance while turning     Standing Unsupported, Alternately Place Feet on Step/Stool Needs assistance to keep from falling or unable to try     Standing Unsupported, One Foot in [employer] balance while stepping or standing     Standing on One Leg Unable to try or needs assist to prevent fall     Total Score 17     Berg comment: BERG  < 36 high risk for falls (close to 100%) 46-51 moderate (>50%)   37-45 significant (>80%) 52-55 lower (> 25%)                   CURRENT PROSTHETIC WEAR ASSESSMENT: 05/19/21:  Patient is dependent with: skin check, residual limb care, care of non-amputated limb, prosthetic cleaning, ply sock cleaning, correct ply sock adjustment, proper wear schedule/adjustment, and proper weight-bearing schedule/adjustment Donning prosthesis: SBA Doffing prosthesis: Modified independence Prosthetic wear tolerance: 5-6 hours/day, 16 of 16 days since delivery.  Prosthetic weight bearing tolerance: 5 minutes with partial weight on prosthesis & intermittent support with RW Residual limb condition: 2 invaginated areas with one not clean, 1 small area on incision that may be suture working way out, dry skin, cylinderical shape, nonpitting edema,  Prosthetic description: silicon liner with velcro lanyard, ischial containment socket with flexible inner liner, SAFETY knee (single axis nonhydraulic), K2 flexible keel foot    TODAY'S TREATMENT:  09/20/21 Prosthetic Training with Transfemoral Prosthesis: PT education on using rollator in community, UE cane support for walking vs standing and pt verbalized understanding and ability to bring rollator to clinic next session Pt ambulate 20' with 5 step side-stepping between obstacles x 2 and 180* turn, required minA handhold assist to turn  to side for first lateral step Pt ambulate 20' with 180* turn while holding plate with tennis balls to simulate manual load of carrying dinner plate within household with conversation, SBA Pt ambulate 100' with weaving around obstacles to simulate furniture in home and stepping over threshold with SBA. Pt required reminders of full step over threshold with prosthesis for safety and knee stability PT education on continuing HEP suggestions of both a daily longer walk with rollator for building endurance and multiple shorter walks at household with cane, taking standing rest breaks when appropriate. Pt verbalized understanding  09/19/21 Prosthetic Training with Transfemoral Prosthesis: PT education on checking strap when prosthesis feels heavier than usual and demo checking strap, pt verbalized understanding PT suggestion for static stance with cane in Lt hand for increased WB on prosthesis, pt returned demo x 1 min and verbalized understanding Pt ambulate 15f with cane in RUE and supervision, pt slowed speed to increase weight shift on prosthesis and spending more even time WB bil.  Neuro Re-ed:  Green band, no UE assist and cueing for controlled eccentric throughout  Alternating rows x 10, bil. rows x 10 reps  Alternating shoulder extension with elbows ext. x 10, bil. x 10 reps  Shoulder ER and horizontal abduction x 10 ea. UE Red band in LUE and cane in RUE, cueing for controlled eccentric   Rt side stepping x 2, Lt side stepping x 1, forward x 1, backward x 1. Pt self-recovered 2 instances of prosthetic knee bucking and corrected for knee extension with side stepping  09/13/21 Prosthetic Training with Transfemoral Prosthesis: PT educate on indication for tightening suspension strap, pt verbalized  understanding and tightened strap Walking around obstacles 5' apart with turns right & left carrying 2lb weight with cane with supervision, pt recovered one minor loss of balance TUG test with cane  as noted in objective - 2x standard TUG, 1x cognitive TUG, 1x manual TUG, 1x cognitive + manual TUG Pt ambulate 120' rollator mod. independent and ascend and descend 8.5" curb (similar to home entrance per pt) with rollator with SBA and no cueing for technique, appears safe to use rollator to navigate tall step to enter and exit home Pt ascend and descend 6 steps total with 3 rail ea. Side - single rail & contralateral UE cane and SBA, verbal cueing for cane placement sequence  HOME EXERCISE PROGRAM Access Code: [email or web address] URL: [email or web address] Date: 07/04/2021 Prepared by: Jamey Reas  Exercises - Seated Hamstring Stretch with Strap  - 2 x daily - 7 x weekly - 1 sets - 2-3 reps - 30 seconds hold - Modified Cieslinski Stretch  - 2 x daily - 7 x weekly - 1 sets - 2 reps - 30 seconds hold - Supine Dynamic Modified [place]irano Quad and Hip Flexor Dynamic Stretch  - 2 x daily - 7 x weekly - 1 sets - 2 reps - 30 seconds hold - Standing posture with back to counter  - 2 x daily - 7 x weekly - 1 sets - 1 reps - 5-10 minutes hold - Upright Stance at Door Frame Single Arm  - 3-6 x daily - 7 x weekly - 1 sets - 2 reps - 2 deep breathes hold - Upright Stance at Door Frame with Both Arms  - 3-6 x daily - 7 x weekly - 1 sets - 2 reps - 2 deep breathes hold - feet together eyes open on floor  - 1 x daily - 4-7 x weekly - 1 sets - 10 reps - 2 seconds hold - Feet Apart with Eyes Closed with Head Motions  - 1 x daily - 4-7 x weekly - 1 sets - 10 reps - 2 seconds hold - Wide stance on Foam Pad head movements  - 1 x daily - 4-7 x weekly - 1 sets - 10 reps - 2 seconds hold - Standing Quarter Turn with Counter Support  - 1 x daily - 4-7 x weekly - 1 sets - 10 reps - Side Stepping with Counter Support  - 1 x daily - 5-7 x weekly - 1 sets - 10 reps - Backward Walking with Counter Support  - 1 x daily - 5-7 x weekly - 1 sets - 10 reps    05/24/21 1151  PT Education  Education Details HEP at sink &  residual limb pain vs phantom sensation vs phantom pain  Person(s) Educated Patient  Methods Explanation;Demonstration;Tactile cues;Verbal cues;Handout  Comprehension Verbalized understanding;Returned demonstration;Verbal cues required;Tactile cues required;Need further instruction   ASSESSMENT:  CLINICAL IMPRESSION: Today's session included progressing household gait tasks with manual and cognitive load and increased complexity of obstacle negotiation. He required handhold assist momentarily during first task, reporting Rt leg soreness and fatigue from walking with cane in LUE following misunderstanding of recommendations from yesterday. Cane use during static standing and walking was corrected today for LUE during standing only and RUE or BUE support with gait. He is likely on track to meet remaining long term goals and will be reassessed next week prior to discharge.   OBJECTIVE IMPAIRMENTS Abnormal gait, decreased activity tolerance, decreased balance, decreased endurance, decreased knowledge of condition, decreased knowledge of  use of DME, decreased mobility, decreased ROM, decreased strength, impaired flexibility, postural dysfunction, prosthetic dependency , and pain.    ACTIVITY LIMITATIONS community activity and occupation.    PERSONAL FACTORS Fitness, Time since onset of injury/illness/exacerbation, and 3+ comorbidities: see PMH  are also affecting patient's functional outcome.    REHAB POTENTIAL: Good   CLINICAL DECISION MAKING: Evolving/moderate complexity   EVALUATION COMPLEXITY: Moderate     GOALS: Goals reviewed with patient? Yes   SHORT TERM GOALS: Target date: 09/08/2021   Patient verbalizes how to decrease residual limb pain. Goal status: met 09/07/21 2.  Patient tolerates prosthesis >90% awake hrs /day without skin issues or limb pain </= 2/10 >/= 75% of days after standing / walking. Goal status: met 09/07/21   3. Patient ambulates 100' with cane stand alone tip  without hand hold assist & prosthesis with min guard. Goal status: met 09/12/21 SBA  LONG TERM GOALS: Target date: 09/05/6809 (reset at re-certification)   Patient demonstrates & verbalized understanding of prosthetic care including issues to address limb pain to enable safe utilization of prosthesis. Baseline: SEE OBJECTIVE DATA Goal status: ongoing 08/09/2021   Patient tolerates prosthesis wear >90% of awake hours without skin or limb pain issues. Baseline: SEE OBJECTIVE DATA Goal status: met 09/13/2021  Merrilee Jansky Balance >/= 45/56 to indicate lower fall risk Baseline: SEE OBJECTIVE DATA Goal status: reset to higher level on 08/11/2021   Patient ambulates >500' with LRAD & prosthesis modified independent. Baseline: SEE OBJECTIVE DATA Goal status: met 08/09/2021 with RW   Patient negotiates ramps, curbs & stairs with single rail with LRAD & prosthesis modified independent. Baseline: SEE OBJECTIVE DATA Goal status: met 08/11/2021 with RW, ramp and curb 09/12/21 with rollator  6.  Patient ambulates 100' with cane & prosthesis modified independent.  Baseline: SEE OBJECTIVE DATA Goal status: NEW 08/11/2021  7. Timed Up & Go with cane stand alone tip modified independent:  Std TUG <60sec, Cog TUG <9mn, Manual TUG <2 min Baseline: SEE OBJECTIVE DATA Goal status: met 09/13/2021   PLAN: PT FREQUENCY: 2x/week   PT DURATION: 8 weeks   PLANNED INTERVENTIONS: Therapeutic exercises, Therapeutic activity, Neuromuscular re-education, Balance training, Gait training, Patient/Family education, Stair training, Vestibular training, Prosthetic training, and DME instructions, Physical Performance Testing   PLAN FOR NEXT SESSION:  Assess 100' walk with cane mod ind. and prosthetic care LTGs. Discharge planning including HEP and prosthesis management  KJana Hakim Student-PT 09/20/2021, 12:38 PM  This entire session of physical therapy was performed under the direct supervision of PT signing evaluation  /treatment. PT reviewed note and agrees.  RJamey Reas PT, DPT 09/20/2021, 1:21 PM

## 2021-09-21 ENCOUNTER — Encounter: Payer: Medicare Other | Admitting: Physical Therapy

## 2021-09-26 ENCOUNTER — Ambulatory Visit (INDEPENDENT_AMBULATORY_CARE_PROVIDER_SITE_OTHER): Payer: Medicare Other | Admitting: Physical Therapy

## 2021-09-26 ENCOUNTER — Encounter: Payer: Self-pay | Admitting: Physical Therapy

## 2021-09-26 DIAGNOSIS — R2681 Unsteadiness on feet: Secondary | ICD-10-CM

## 2021-09-26 DIAGNOSIS — R293 Abnormal posture: Secondary | ICD-10-CM

## 2021-09-26 DIAGNOSIS — R2689 Other abnormalities of gait and mobility: Secondary | ICD-10-CM | POA: Diagnosis not present

## 2021-09-26 DIAGNOSIS — G546 Phantom limb syndrome with pain: Secondary | ICD-10-CM | POA: Diagnosis not present

## 2021-09-26 DIAGNOSIS — M6281 Muscle weakness (generalized): Secondary | ICD-10-CM | POA: Diagnosis not present

## 2021-09-26 DIAGNOSIS — M79605 Pain in left leg: Secondary | ICD-10-CM | POA: Diagnosis not present

## 2021-09-26 NOTE — Therapy (Signed)
OUTPATIENT PHYSICAL THERAPY PROSTHETIC TREATMENT NOTE  Patient Name: Ryan [name] MRN: [id number] DOB:08-26-1950, 71 y.o., male Today's Date: 09/26/2021  PCP: Iona Beard, MD REFERRING PROVIDER: Karoline Caldwell, PA-C   End of Session:   PT End of Session - 09/26/21 0929     Visit Number 38    Number of Visits 41    Date for PT Re-Evaluation 10/06/21    Authorization Type Medicare A/B & Generic commercial    Progress Note Due on Visit 24    PT Start Time 0929    PT Stop Time 1013    PT Time Calculation (min) 44 min    Equipment Utilized During Treatment Gait belt    Activity Tolerance Patient tolerated treatment well    Behavior During Therapy WFL for tasks assessed/performed             Past Medical History:  Diagnosis Date   COVID 2022   Hypertension    PVD (peripheral vascular disease) ([place])    Past Surgical History:  Procedure Laterality Date   ABDOMINAL AORTAGRAM Left 12/13/2016   ABDOMINAL AORTOGRAM W/LOWER EXTREMITY/notes 12/13/2016   ABDOMINAL AORTOGRAM W/LOWER EXTREMITY N/A 11/28/2016   Procedure: ABDOMINAL AORTOGRAM W/LOWER EXTREMITY;  Surgeon: Adrian Prows, MD;  Location: [facility];  Service: Cardiovascular;  Laterality: N/A;  bilateral   ABDOMINAL AORTOGRAM W/LOWER EXTREMITY N/A 12/13/2016   Procedure: ABDOMINAL AORTOGRAM W/LOWER EXTREMITY;  Surgeon: Waynetta Sandy, MD;  Location: [facility];  Service: Cardiovascular;  Laterality: N/A;  unilater left leg   ABDOMINAL AORTOGRAM W/LOWER EXTREMITY N/A 01/09/2018   Procedure: ABDOMINAL AORTOGRAM W/LOWER EXTREMITY;  Surgeon: Marty Heck, MD;  Location: [facility];  Service: Cardiovascular;  Laterality: N/A;   ABDOMINAL AORTOGRAM W/LOWER EXTREMITY Left 09/22/2019   Procedure: ABDOMINAL AORTOGRAM W/LOWER EXTREMITY;  Surgeon: Waynetta Sandy, MD;  Location: [facility];  Service: Cardiovascular;  Laterality: Left;   ABDOMINAL AORTOGRAM W/LOWER EXTREMITY  Left 12/02/2020   Procedure: ABDOMINAL AORTOGRAM W/LOWER EXTREMITY;  Surgeon: Marty Heck, MD;  Location: [facility];  Service: Cardiovascular;  Laterality: Left;   ABDOMINAL AORTOGRAM W/LOWER EXTREMITY N/A 12/22/2020   Procedure: ABDOMINAL AORTOGRAM W/LOWER EXTREMITY;  Surgeon: Broadus John, MD;  Location: [facility];  Service: Cardiovascular;  Laterality: N/A;   AMPUTATION Left 02/11/2021   Procedure: LEFT ABOVE KNEE AMPUTATION;  Surgeon: Marty Heck, MD;  Location: [facility];  Service: Vascular;  Laterality: Left;   BYPASS GRAFT POPLITEAL TO TIBIAL Left 12/31/2020   Procedure: LEFT JUMP GRAFT REVISION BELOW KNEE POPLITEAL TO PERONEAL;  Surgeon: Marty Heck, MD;  Location: [facility];  Service: Vascular;  Laterality: Left;   EMBOLIZATION Left 12/03/2020   Procedure: EMBOLIZATION;  Surgeon: Cherre Robins, MD;  Location: [facility];  Service: Cardiovascular;  Laterality: Left;   FEMORAL-POPLITEAL BYPASS GRAFT Left 12/14/2016   Procedure: BYPASS GRAFT COMMON FEMORAL- BELOW KNEE POPLITEAL ARTERY with Bovine Patch to Below the knee poplitieal artery.;  Surgeon: Conrad Denver, MD;  Location: [facility];  Service: Vascular;  Laterality: Left;   FEMORAL-POPLITEAL BYPASS GRAFT Left 01/28/2018   Procedure: BYPASS GRAFT FEMORAL-BELOW KNEE POPLITEAL ARTERY REDO with propaten vascular graft removable ring;  Surgeon: Marty Heck, MD;  Location: [facility];  Service: Vascular;  Laterality: Left;   FEMUR SURGERY Right 2009   pt. has a rod in that leg   LOWER EXTREMITY ANGIOGRAPHY Left 11/28/2016   Procedure: Lower Extremity Angiography;  Surgeon:  Adrian Prows, MD;  Location: [facility];  Service: Cardiovascular;  Laterality: Left;  lysis followup lower leg   PATCH ANGIOPLASTY Left 01/28/2018   Procedure: PATCH ANGIOPLASTY USING Rueben Bash BIOLOGIC PATCH;  Surgeon: Marty Heck, MD;  Location: [place];  Service: Vascular;  Laterality: Left;   PATCH  ANGIOPLASTY Left 06/16/2020   Procedure: PATCH ANGIOPLASTY OF LEFT BELOW KNEE [place] ARTERY;  Surgeon: Marty Heck, MD;  Location: [facility];  Service: Vascular;  Laterality: Left;   PERIPHERAL VASCULAR BALLOON ANGIOPLASTY  11/28/2016   Procedure: PERIPHERAL VASCULAR BALLOON ANGIOPLASTY;  Surgeon: Adrian Prows, MD;  Location: [facility];  Service: Cardiovascular;;  SFA   PERIPHERAL VASCULAR BALLOON ANGIOPLASTY  12/03/2020   Procedure: PERIPHERAL VASCULAR BALLOON ANGIOPLASTY;  Surgeon: Cherre Robins, MD;  Location: [facility];  Service: Cardiovascular;;  TP Trunk and Peroneal   PERIPHERAL VASCULAR CATHETERIZATION N/A 08/11/2014   Procedure: Lower Extremity Angiography;  Surgeon: Adrian Prows, MD;  Location: [facility];  Service: Cardiovascular;  Laterality: N/A;   PERIPHERAL VASCULAR INTERVENTION Left 09/22/2019   Procedure: PERIPHERAL VASCULAR INTERVENTION;  Surgeon: Waynetta Sandy, MD;  Location: [facility];  Service: Cardiovascular;  Laterality: Left;   PERIPHERAL VASCULAR INTERVENTION Left 09/23/2019   Procedure: PERIPHERAL VASCULAR INTERVENTION;  Surgeon: Serafina Mitchell, MD;  Location: [facility];  Service: Cardiovascular;  Laterality: Left;   PERIPHERAL VASCULAR INTERVENTION Left 05/13/2020   Procedure: PERIPHERAL VASCULAR INTERVENTION;  Surgeon: Marty Heck, MD;  Location: [facility];  Service: Cardiovascular;  Laterality: Left;   PERIPHERAL VASCULAR INTERVENTION Left 12/02/2020   Procedure: PERIPHERAL VASCULAR INTERVENTION;  Surgeon: Marty Heck, MD;  Location: [facility];  Service: Cardiovascular;  Laterality: Left;   PERIPHERAL VASCULAR INTERVENTION  12/03/2020   Procedure: PERIPHERAL VASCULAR INTERVENTION;  Surgeon: Cherre Robins, MD;  Location: [facility];  Service: Cardiovascular;;   PERIPHERAL VASCULAR INTERVENTION Left 12/22/2020   Procedure: PERIPHERAL VASCULAR INTERVENTION;  Surgeon:  Broadus John, MD;  Location: [facility];  Service: Cardiovascular;  Laterality: Left;   PERIPHERAL VASCULAR THROMBECTOMY  11/28/2016   Procedure: PERIPHERAL VASCULAR THROMBECTOMY;  Surgeon: Adrian Prows, MD;  Location: [facility];  Service: Cardiovascular;;  Profunda   PERIPHERAL VASCULAR THROMBECTOMY  11/28/2016   Procedure: PERIPHERAL VASCULAR THROMBECTOMY;  Surgeon: Adrian Prows, MD;  Location: [facility];  Service: Cardiovascular;;   PERIPHERAL VASCULAR THROMBECTOMY N/A 05/12/2020   Procedure: PERIPHERAL VASCULAR THROMBECTOMY-Left Leg;  Surgeon: Cherre Robins, MD;  Location: [facility];  Service: Cardiovascular;  Laterality: N/A;   PERIPHERAL VASCULAR THROMBECTOMY N/A 05/13/2020   Procedure: PERIPHERAL VASCULAR THROMBECTOMY- LYSIS RECHECK;  Surgeon: Marty Heck, MD;  Location: [facility];  Service: Cardiovascular;  Laterality: N/A;   PERIPHERAL VASCULAR THROMBECTOMY N/A 12/02/2020   Procedure: PERIPHERAL VASCULAR THROMBECTOMY;  Surgeon: Marty Heck, MD;  Location: [facility];  Service: Cardiovascular;  Laterality: N/A;   PERIPHERAL VASCULAR THROMBECTOMY N/A 12/03/2020   Procedure: LYSIS RECHECK;  Surgeon: Cherre Robins, MD;  Location: [facility];  Service: Cardiovascular;  Laterality: N/A;   PULMONARY THROMBECTOMY N/A 09/23/2019   Procedure: LYSIS RECHECK;  Surgeon: Serafina Mitchell, MD;  Location: [facility];  Service: Cardiovascular;  Laterality: N/A;   THROMBECTOMY FEMORAL ARTERY Left 12/14/2016   Procedure: THROMBECTOMY PROFUNDA FEMORAL ARTERY;  Surgeon: Conrad [place], MD;  Location: [facility];  Service: Vascular;  Laterality: Left;   THROMBECTOMY FEMORAL ARTERY Left 06/16/2020   Procedure: Redo exposure ot Left below knee popiteal artery;  Surgeon: Cephus Shelling, MD;  [facility];  Service: Vascular;  Laterality: Left;   THROMBECTOMY OF BYPASS GRAFT FEMORAL- POPLITEAL ARTERY Left 06/16/2020    Procedure: THROMBECTOMY OF BYPASS GRAFT FEMORAL-POPLITEAL ARTERY and TIBIAL ARTERY;  Surgeon: Cephus Shelling, MD;  Location: [facility];  Service: Vascular;  Laterality: Left;   THROMBECTOMY OF BYPASS GRAFT FEMORAL- POPLITEAL ARTERY Left 12/31/2020   Procedure: THROMBECTOMY OF LEFT COMMON FEMORAL TO BELOW KNEE POPLITEAL ARTERY BYPASS GRAFT;  Surgeon: Cephus Shelling, MD;  Location: [facility];  Service: Vascular;  Laterality: Left;   VEIN HARVEST Left 12/31/2020   Procedure: Left Greater Saphenous Vein Harvest;  Surgeon: Cephus Shelling, MD;  Location: [facility];  Service: Vascular;  Laterality: Left;   Patient Active Problem List   Diagnosis Date Noted   Peripheral artery disease (HCC) 12/31/2020   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   Thrombosis of femoro-popliteal bypass graft (HCC) 05/12/2020   Critical lower limb ischemia (HCC) 12/11/2016   Claudication in peripheral vascular disease (HCC) 11/28/2016   HTN (hypertension) 10/09/2014   Tobacco use 10/09/2014   PAD (peripheral artery disease) (H[facility])     REFERRING DIAG: 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 He reported feeling nervous to walk with cane more then 4 steps on Saturday, but feeling much better on Sunday.  PAIN:  Are you having pain?  Yes: NPRS scale: arrived to PT 0/10.  Pain location: distal residual limb Pain description: cramping when he wakes up, soreness walking & aches when sitting with prosthesis. Aggravating factors: walking with sudden  increases in time Relieving factors: wearing prosthesis  OBJECTIVE:    POSTURE: 05/19/21:  rounded shoulders, forward head, flexed trunk , and weight shift right   LE ROM:   ROM P:passive  A:active Right 05/19/2021 Left 05/19/2021 Left 08/11/2021  Hip flexion       Hip extension   -15* standing -5* standing  Hip abduction       Hip adduction       Hip internal rotation       Hip external rotation       Knee flexion       Knee extension       Ankle dorsiflexion       Ankle plantarflexion       Ankle inversion       Ankle eversion        (Blank rows = not tested) MMT:   MMT Right 05/19/2021 Left 05/19/2021 Left 08/11/21  Hip flexion       Hip extension   3/5 Gross functional 4/5 Gross functional  Hip abduction   3/5 Gross functional 4/5 Gross functional  Hip adduction       Hip internal rotation       Hip external rotation       Knee flexion       Knee extension  Ankle dorsiflexion       Ankle plantarflexion       Ankle inversion       Ankle eversion       (Blank rows = not tested)   TRANSFERS: Sit to stand: 05/19/21:  SBA and requires UE assist with armrests & RW touch to stabilize Stand to sit: 05/19/21:  SBA and requires UE assist with armrests & RW stabilization   GAIT: 09/13/2021: Pt amb, neg curb and ramp with rollator and TFA prosthesis modified independent Pt amb household distances with cane SBA  08/10/2021:  Pt neg curb with RW & TFA prosthesis modified independent. Pt neg ramp with RW & TFA prosthesis with verbal cues on hand position on RW to descend. Pt neg stairs with single rail & cane safely modified independent.   08/09/2021: pt amb 516' in 9 min with RW & TFA prosthesis modified independent. Post gait HR 67 SpO2 97%  Gait velocity: 1.10 ft/sec  He had one misstep that he self corrected.    Pattern: step to with decreased right step length, left hip hike.  Good prosthetic foot clearance, minimal abduction intermittently noted.   05/19/21:   Gait pattern: step to pattern, decreased step length- Right, decreased stance time- Left, decreased hip/knee flexion- Left, circumduction- Left, Left hip hike, antalgic, trunk flexed, abducted- Left, and poor foot clearance- Left  Distance walked: 80' Assistive device utilized: Environmental consultant - 2 wheeled and TFA prosthesis  excessive UE weight bearing Level of assistance: SBA   FUNCTIONAL TESTs:  09/13/2021 TUG with cane stand alone tip:   Std TUG 62.96 sec with SBA, 46.47 sec SBA  Cog TUG 61.59 sec with SBA with pt describing how to build sunroom, able to talk entire time including turns  Manual TUG 57.50 sec SBA carrying open bottle of water with no spills  Cog + Manual TUG 70.93 sec SBA  08/11/2021  TUG with cane stand alone tip:  PT had pt walk one TUG path with minA for warm-up.  Std TUG 95.03 sec with minA  Cog TUG 146.38 sec with minA with pt describing how to build doghouse, able to talk entire time including turns.  Manual TUG 137.65 sec carrying open bottle of water with no spills.  08/09/2021  Berg Balance 34/56 & tasks of Berg with cane support 46/56    Berg Balance Scale: 05/19/21:  17/56   [facility] PT Assessment - 05/19/21 0900                Standardized Balance Assessment    Standardized Balance Assessment Berg Balance Test          Berg Balance Test    Sit to Stand Needs minimal aid to stand or to stabilize     Standing Unsupported Able to stand 2 minutes with supervision     Sitting with Back Unsupported but Feet Supported on Floor or Stool Able to sit safely and securely 2 minutes     Stand to Sit Controls descent by using hands     Transfers Able to transfer safely, definite need of hands     Standing Unsupported with Eyes Closed Unable to keep eyes closed 3 seconds but stays steady     Standing Unsupported with Feet Together Needs help to attain position but able to stand for 30 seconds with feet together     From Standing, Reach Forward with Outstretched Arm Reaches  forward but needs supervision     From Standing Position, Pick up Object  from Floor Unable to try/needs assist to keep balance     From Standing Position, Turn to Look Behind Over each Shoulder Needs assist to keep from losing balance and falling     Turn 360 Degrees Needs assistance while turning     Standing Unsupported, Alternately Place Feet on Step/Stool Needs assistance to keep from falling or unable to try     Standing Unsupported, One Foot in [employer] balance while stepping or standing     Standing on One Leg Unable to try or needs assist to prevent fall     Total Score 17     Berg comment: BERG  < 36 high risk for falls (close to 100%) 46-51 moderate (>50%)   37-45 significant (>80%) 52-55 lower (> 25%)                   CURRENT PROSTHETIC WEAR ASSESSMENT: 05/19/21:  Patient is dependent with: skin check, residual limb care, care of non-amputated limb, prosthetic cleaning, ply sock cleaning, correct ply sock adjustment, proper wear schedule/adjustment, and proper weight-bearing schedule/adjustment Donning prosthesis: SBA Doffing prosthesis: Modified independence Prosthetic wear tolerance: 5-6 hours/day, 16 of 16 days since delivery.  Prosthetic weight bearing tolerance: 5 minutes with partial weight on prosthesis & intermittent support with RW Residual limb condition: 2 invaginated areas with one not clean, 1 small area on incision that may be suture working way out, dry skin, cylinderical shape, nonpitting edema,  Prosthetic description: silicon liner with velcro lanyard, ischial containment socket with flexible inner liner, SAFETY knee (single axis nonhydraulic), K2 flexible keel foot    TODAY'S TREATMENT:  09/26/21 Prosthetic Training with Transfemoral Prosthesis: PT education on checking strap when feeling and demo checking strap, pt verbalized understanding and tightened strap Pt ambulate 100' with cane mod. Independent Pt ambulate 50' with cane supervision / verbal  cueing for full turn and controlled sit but no physical assist needed PT education on rollator set up, basket, brakes without wheel turning and pt verbalized understanding and ability to fix brakes PT education on indication for changing sock ply and proper donning, pt verbalized understanding and stood in socket with 1-ply to assess for feeling of too much  09/20/21 Prosthetic Training with Transfemoral Prosthesis: PT education on using rollator in community, UE cane support for walking vs standing and pt verbalized understanding and ability to bring rollator to clinic next session Pt ambulate 20' with 5 step side-stepping between obstacles x 2 and 180* turn, required minA handhold assist to turn to side for first lateral step Pt ambulate 20' with 180* turn while holding plate with tennis balls to simulate manual load of carrying dinner plate within household with conversation, SBA Pt ambulate 100' with weaving around obstacles to simulate furniture in home and stepping over threshold with SBA. Pt required reminders of full step over threshold with prosthesis for safety and knee stability PT education on continuing HEP suggestions of both a daily longer walk with rollator for building endurance and multiple shorter walks at household with cane, taking standing rest breaks when appropriate. Pt verbalized understanding  09/19/21 Prosthetic Training with Transfemoral Prosthesis: PT education on checking strap when prosthesis feels heavier than usual and demo checking strap, pt verbalized understanding PT suggestion for static stance with cane in Lt hand for increased WB on prosthesis, pt returned demo x 1 min and verbalized understanding Pt ambulate 50ft with cane in RUE and supervision, pt slowed speed to increase weight shift on prosthesis and spending more even  time WB bil.  Neuro Re-ed:  Green band, no UE assist and cueing for controlled eccentric throughout  Alternating rows x 10, bil. rows x 10  reps  Alternating shoulder extension with elbows ext. x 10, bil. x 10 reps  Shoulder ER and horizontal abduction x 10 ea. UE Red band in LUE and cane in RUE, cueing for controlled eccentric   Rt side stepping x 2, Lt side stepping x 1, forward x 1, backward x 1. Pt self-recovered 2 instances of prosthetic knee bucking and corrected for knee extension with side stepping  09/13/21 Prosthetic Training with Transfemoral Prosthesis: PT educate on indication for tightening suspension strap, pt verbalized understanding and tightened strap Walking around obstacles 5' apart with turns right & left carrying 2lb weight with cane with supervision, pt recovered one minor loss of balance TUG test with cane as noted in objective - 2x standard TUG, 1x cognitive TUG, 1x manual TUG, 1x cognitive + manual TUG Pt ambulate 120' rollator mod. independent and ascend and descend 8.5" curb (similar to home entrance per pt) with rollator with SBA and no cueing for technique, appears safe to use rollator to navigate tall step to enter and exit home Pt ascend and descend 6 steps total with 3 rail ea. Side - single rail & contralateral UE cane and SBA, verbal cueing for cane placement sequence  HOME EXERCISE PROGRAM Access Code: [email or web address] URL: [email or web address] Date: 07/04/2021 Prepared by: Jamey Reas  Exercises - Seated Hamstring Stretch with Strap  - 2 x daily - 7 x weekly - 1 sets - 2-3 reps - 30 seconds hold - Modified Grochowski Stretch  - 2 x daily - 7 x weekly - 1 sets - 2 reps - 30 seconds hold - Supine Dynamic Modified [place] Quad and Hip Flexor Dynamic Stretch  - 2 x daily - 7 x weekly - 1 sets - 2 reps - 30 seconds hold - Standing posture with back to counter  - 2 x daily - 7 x weekly - 1 sets - 1 reps - 5-10 minutes hold - Upright Stance at Door Frame Single Arm  - 3-6 x daily - 7 x weekly - 1 sets - 2 reps - 2 deep breathes hold - Upright Stance at Door Frame with Both Arms  - 3-6 x daily -  7 x weekly - 1 sets - 2 reps - 2 deep breathes hold - feet together eyes open on floor  - 1 x daily - 4-7 x weekly - 1 sets - 10 reps - 2 seconds hold - Feet Apart with Eyes Closed with Head Motions  - 1 x daily - 4-7 x weekly - 1 sets - 10 reps - 2 seconds hold - Wide stance on Foam Pad head movements  - 1 x daily - 4-7 x weekly - 1 sets - 10 reps - 2 seconds hold - Standing Quarter Turn with Counter Support  - 1 x daily - 4-7 x weekly - 1 sets - 10 reps - Side Stepping with Counter Support  - 1 x daily - 5-7 x weekly - 1 sets - 10 reps - Backward Walking with Counter Support  - 1 x daily - 5-7 x weekly - 1 sets - 10 reps    05/24/21 1151  PT Education  Education Details HEP at sink & residual limb pain vs phantom sensation vs phantom pain  Person(s) Educated Patient  Methods Explanation;Demonstration;Tactile cues;Verbal cues;Handout  Comprehension Verbalized understanding;Returned demonstration;Verbal cues required;Tactile cues required;Need  further instruction   ASSESSMENT:  CLINICAL IMPRESSION: Today's session included education for prosthesis management (checking strap and socks) and rollator fitting and brakes, with patient willingness to fix the brakes to fully lock. He met the LTG of walking 100' with a cane and modified independence, and remains on track to meet remaining LTGs for discharge next appointment.   OBJECTIVE IMPAIRMENTS Abnormal gait, decreased activity tolerance, decreased balance, decreased endurance, decreased knowledge of condition, decreased knowledge of use of DME, decreased mobility, decreased ROM, decreased strength, impaired flexibility, postural dysfunction, prosthetic dependency , and pain.    ACTIVITY LIMITATIONS community activity and occupation.    PERSONAL FACTORS Fitness, Time since onset of injury/illness/exacerbation, and 3+ comorbidities: see PMH  are also affecting patient's functional outcome.    REHAB POTENTIAL: Good   CLINICAL DECISION MAKING:  Evolving/moderate complexity   EVALUATION COMPLEXITY: Moderate     GOALS: Goals reviewed with patient? Yes   SHORT TERM GOALS: Target date: 09/08/2021   Patient verbalizes how to decrease residual limb pain. Goal status: met 09/07/21 2.  Patient tolerates prosthesis >90% awake hrs /day without skin issues or limb pain </= 2/10 >/= 75% of days after standing / walking. Goal status: met 09/07/21   3. Patient ambulates 100' with cane stand alone tip without hand hold assist & prosthesis with min guard. Goal status: met 09/12/21 SBA  LONG TERM GOALS: Target date: 0/08/6759 (reset at re-certification)   Patient demonstrates & verbalized understanding of prosthetic care including issues to address limb pain to enable safe utilization of prosthesis. Baseline: SEE OBJECTIVE DATA Goal status: ongoing 08/09/2021   Patient tolerates prosthesis wear >90% of awake hours without skin or limb pain issues. Baseline: SEE OBJECTIVE DATA Goal status: met 09/26/2021  Merrilee Jansky Balance >/= 45/56 to indicate lower fall risk Baseline: SEE OBJECTIVE DATA Goal status: reset to higher level on 08/11/2021   Patient ambulates >500' with LRAD & prosthesis modified independent. Baseline: SEE OBJECTIVE DATA Goal status: met 08/09/2021 with RW   Patient negotiates ramps, curbs & stairs with single rail with LRAD & prosthesis modified independent. Baseline: SEE OBJECTIVE DATA Goal status: met 08/11/2021 with RW, ramp and curb 09/12/21 with rollator  6.  Patient ambulates 100' with cane & prosthesis modified independent.  Baseline: SEE OBJECTIVE DATA Goal status: met 09/26/21  7. Timed Up & Go with cane stand alone tip modified independent:  Std TUG <60sec, Cog TUG <56min, Manual TUG <2 min Baseline: SEE OBJECTIVE DATA Goal status: met 09/13/2021   PLAN: PT FREQUENCY: 2x/week   PT DURATION: 8 weeks   PLANNED INTERVENTIONS: Therapeutic exercises, Therapeutic activity, Neuromuscular re-education, Balance training, Gait  training, Patient/Family education, Stair training, Vestibular training, Prosthetic training, and DME instructions, Physical Performance Testing   PLAN FOR NEXT SESSION:  Assess BERG and prosthetic care LTGs. Discharge next visit with review of HEP and ongoing prosthetic management suggestions  Jana Hakim, Student-PT 09/26/2021, 11:47 AM  This entire session of physical therapy was performed under the direct supervision of PT signing evaluation /treatment. PT reviewed note and agrees.  Jamey Reas, PT, DPT 09/26/2021, 1:21 PM

## 2021-09-27 ENCOUNTER — Ambulatory Visit (INDEPENDENT_AMBULATORY_CARE_PROVIDER_SITE_OTHER): Payer: Medicare Other | Admitting: Physical Therapy

## 2021-09-27 ENCOUNTER — Encounter: Payer: Self-pay | Admitting: Physical Therapy

## 2021-09-27 DIAGNOSIS — M6281 Muscle weakness (generalized): Secondary | ICD-10-CM

## 2021-09-27 DIAGNOSIS — R293 Abnormal posture: Secondary | ICD-10-CM | POA: Diagnosis not present

## 2021-09-27 DIAGNOSIS — M79605 Pain in left leg: Secondary | ICD-10-CM

## 2021-09-27 DIAGNOSIS — R2681 Unsteadiness on feet: Secondary | ICD-10-CM

## 2021-09-27 DIAGNOSIS — R2689 Other abnormalities of gait and mobility: Secondary | ICD-10-CM

## 2021-09-27 DIAGNOSIS — G546 Phantom limb syndrome with pain: Secondary | ICD-10-CM

## 2021-09-27 NOTE — Therapy (Signed)
OUTPATIENT PHYSICAL THERAPY PROSTHETIC TREATMENT NOTE & DISCHARGE NOTE  Patient Name: Ryan [name] MRN: [id number] DOB:[birth year], 71 y.o., male Today's Date: 09/27/2021  PCP: Iona Beard, MD REFERRING PROVIDER: Karoline Caldwell, PA-C  PHYSICAL THERAPY DISCHARGE SUMMARY  Visits from Start of Care: 39  Current functional level related to goals / functional outcomes: See note   Remaining deficits: See note   Education / Equipment: Pt was instructed in prosthetic care & HEP and appears to understand both.   Patient agrees to discharge. Patient goals were met. Patient is being discharged due to meeting the stated rehab goals.  End of Session:   PT End of Session - 09/27/21 1005     Visit Number 39    Number of Visits 41    Date for PT Re-Evaluation 10/06/21    Authorization Type Medicare A/B & Generic commercial    Progress Note Due on Visit 35    PT Start Time 0930    PT Stop Time 1005    PT Time Calculation (min) 35 min    Equipment Utilized During Treatment Gait belt    Activity Tolerance Patient tolerated treatment well    Behavior During Therapy WFL for tasks assessed/performed             Past Medical History:  Diagnosis Date   COVID 2022   Hypertension    PVD (peripheral vascular disease) ([place])    Past Surgical History:  Procedure Laterality Date   ABDOMINAL AORTAGRAM Left 12/13/2016   ABDOMINAL AORTOGRAM W/LOWER EXTREMITY/notes 12/13/2016   ABDOMINAL AORTOGRAM W/LOWER EXTREMITY N/A 11/28/2016   Procedure: ABDOMINAL AORTOGRAM W/LOWER EXTREMITY;  Surgeon: Adrian Prows, MD;  Location: [place] [facility];  Service: Cardiovascular;  Laterality: N/A;  bilateral   ABDOMINAL AORTOGRAM W/LOWER EXTREMITY N/A 12/13/2016   Procedure: ABDOMINAL AORTOGRAM W/LOWER EXTREMITY;  Surgeon: Waynetta Sandy, MD;  Location: [place] [facility];  Service: Cardiovascular;  Laterality: N/A;  unilater left leg   ABDOMINAL AORTOGRAM W/LOWER EXTREMITY N/A 01/09/2018    Procedure: ABDOMINAL AORTOGRAM W/LOWER EXTREMITY;  Surgeon: Marty Heck, MD;  Location: [place] [facility];  Service: Cardiovascular;  Laterality: N/A;   ABDOMINAL AORTOGRAM W/LOWER EXTREMITY Left 09/22/2019   Procedure: ABDOMINAL AORTOGRAM W/LOWER EXTREMITY;  Surgeon: Waynetta Sandy, MD;  Location: [place] [facility];  Service: Cardiovascular;  Laterality: Left;   ABDOMINAL AORTOGRAM W/LOWER EXTREMITY Left 12/02/2020   Procedure: ABDOMINAL AORTOGRAM W/LOWER EXTREMITY;  Surgeon: Marty Heck, MD;  Location: [place] [facility];  Service: Cardiovascular;  Laterality: Left;   ABDOMINAL AORTOGRAM W/LOWER EXTREMITY N/A 12/22/2020   Procedure: ABDOMINAL AORTOGRAM W/LOWER EXTREMITY;  Surgeon: Broadus John, MD;  Location: [place] [facility];  Service: Cardiovascular;  Laterality: N/A;   AMPUTATION Left 02/11/2021   Procedure: LEFT ABOVE KNEE AMPUTATION;  Surgeon: Marty Heck, MD;  Location: [place];  Service: Vascular;  Laterality: Left;   BYPASS GRAFT POPLITEAL TO TIBIAL Left 12/31/2020   Procedure: LEFT JUMP GRAFT REVISION BELOW KNEE POPLITEAL TO PERONEAL;  Surgeon: Marty Heck, MD;  Location: [place];  Service: Vascular;  Laterality: Left;   EMBOLIZATION Left 12/03/2020   Procedure: EMBOLIZATION;  Surgeon: Cherre Robins, MD;  Location: [place] [facility];  Service: Cardiovascular;  Laterality: Left;   FEMORAL-POPLITEAL BYPASS GRAFT Left 12/14/2016   Procedure: BYPASS GRAFT COMMON FEMORAL- BELOW KNEE POPLITEAL ARTERY with Bovine Patch to Below the knee poplitieal artery.;  Surgeon: Conrad Norco, MD;  Location: [place];  Service: Vascular;  Laterality: Left;   FEMORAL-POPLITEAL BYPASS GRAFT Left 01/28/2018   Procedure: BYPASS GRAFT FEMORAL-BELOW KNEE POPLITEAL ARTERY REDO with propaten vascular graft removable ring;  Surgeon: Marty Heck, MD;  Location: [place];  Service: Vascular;  Laterality: Left;   FEMUR SURGERY Right 2009   pt. has a rod in that leg    LOWER EXTREMITY ANGIOGRAPHY Left 11/28/2016   Procedure: Lower Extremity Angiography;  Surgeon: Adrian Prows, MD;  Location: [place] [facility];  Service: Cardiovascular;  Laterality: Left;  lysis followup lower leg   PATCH ANGIOPLASTY Left 01/28/2018   Procedure: PATCH ANGIOPLASTY USING Rueben Bash BIOLOGIC PATCH;  Surgeon: Marty Heck, MD;  Location: [place];  Service: Vascular;  Laterality: Left;   PATCH ANGIOPLASTY Left 06/16/2020   Procedure: PATCH ANGIOPLASTY OF LEFT BELOW KNEE [place] ARTERY;  Surgeon: Marty Heck, MD;  Location: [place];  Service: Vascular;  Laterality: Left;   PERIPHERAL VASCULAR BALLOON ANGIOPLASTY  11/28/2016   Procedure: PERIPHERAL VASCULAR BALLOON ANGIOPLASTY;  Surgeon: Adrian Prows, MD;  Location: [place] [facility];  Service: Cardiovascular;;  SFA   PERIPHERAL VASCULAR BALLOON ANGIOPLASTY  12/03/2020   Procedure: PERIPHERAL VASCULAR BALLOON ANGIOPLASTY;  Surgeon: Cherre Robins, MD;  Location: [place] [facility];  Service: Cardiovascular;;  TP Trunk and Peroneal   PERIPHERAL VASCULAR CATHETERIZATION N/A 08/11/2014   Procedure: Lower Extremity Angiography;  Surgeon: Adrian Prows, MD;  Location: [facility] [facility];  Service: Cardiovascular;  Laterality: N/A;   PERIPHERAL VASCULAR INTERVENTION Left 09/22/2019   Procedure: PERIPHERAL VASCULAR INTERVENTION;  Surgeon: Waynetta Sandy, MD;  Location: [place] [facility];  Service: Cardiovascular;  Laterality: Left;   PERIPHERAL VASCULAR INTERVENTION Left 09/23/2019   Procedure: PERIPHERAL VASCULAR INTERVENTION;  Surgeon: Serafina Mitchell, MD;  Location: [place] [facility];  Service: Cardiovascular;  Laterality: Left;   PERIPHERAL VASCULAR INTERVENTION Left 05/13/2020   Procedure: PERIPHERAL VASCULAR INTERVENTION;  Surgeon: Marty Heck, MD;  Location: [place] [facility];  Service: Cardiovascular;  Laterality: Left;   PERIPHERAL VASCULAR INTERVENTION Left 12/02/2020   Procedure: PERIPHERAL VASCULAR  INTERVENTION;  Surgeon: Marty Heck, MD;  Location: [place] [facility] [facility];  Service: Cardiovascular;  Laterality: Left;   PERIPHERAL VASCULAR INTERVENTION  12/03/2020   Procedure: PERIPHERAL VASCULAR INTERVENTION;  Surgeon: Cherre Robins, MD;  Location: [place] [place] [facility];  Service: Cardiovascular;;   PERIPHERAL VASCULAR INTERVENTION Left 12/22/2020   Procedure: PERIPHERAL VASCULAR INTERVENTION;  Surgeon: Broadus John, MD;  Location: [place] [facility];  Service: Cardiovascular;  Laterality: Left;   PERIPHERAL VASCULAR THROMBECTOMY  11/28/2016   Procedure: PERIPHERAL VASCULAR THROMBECTOMY;  Surgeon: Adrian Prows, MD;  Location: [facility] [facility];  Service: Cardiovascular;;  Profunda   PERIPHERAL VASCULAR THROMBECTOMY  11/28/2016   Procedure: PERIPHERAL VASCULAR THROMBECTOMY;  Surgeon: Adrian Prows, MD;  Location: [place] [facility];  Service: Cardiovascular;;   PERIPHERAL VASCULAR THROMBECTOMY N/A 05/12/2020   Procedure: PERIPHERAL VASCULAR THROMBECTOMY-Left Leg;  Surgeon: Cherre Robins, MD;  Location: [facility] [facility];  Service: Cardiovascular;  Laterality: N/A;   PERIPHERAL VASCULAR THROMBECTOMY N/A 05/13/2020   Procedure: PERIPHERAL VASCULAR THROMBECTOMY- LYSIS RECHECK;  Surgeon: Marty Heck, MD;  Location: [place] [facility];  Service: Cardiovascular;  Laterality: N/A;   PERIPHERAL VASCULAR THROMBECTOMY N/A 12/02/2020   Procedure: PERIPHERAL VASCULAR THROMBECTOMY;  Surgeon: Marty Heck, MD;  Location: [place] [facility];  Service: Cardiovascular;  Laterality: N/A;   PERIPHERAL VASCULAR THROMBECTOMY N/A 12/03/2020   Procedure: LYSIS RECHECK;  Surgeon: Cherre Robins, MD;  Location: [facility] [facility];  Service: Cardiovascular;  Laterality: N/A;   PULMONARY THROMBECTOMY N/A 09/23/2019   Procedure: LYSIS RECHECK;  Surgeon: Serafina Mitchell, MD;  Location: [facility];  Service: Cardiovascular;  Laterality: N/A;   THROMBECTOMY FEMORAL ARTERY Left 12/14/2016    Procedure: THROMBECTOMY PROFUNDA FEMORAL ARTERY;  Surgeon: Conrad Brecon, MD;  Location: [place];  Service: Vascular;  Laterality: Left;   THROMBECTOMY FEMORAL ARTERY Left 06/16/2020   Procedure: Redo exposure ot Left below knee popiteal artery;  Surgeon: Marty Heck, MD;  Location: [facility];  Service: Vascular;  Laterality: Left;   THROMBECTOMY OF BYPASS GRAFT FEMORAL- POPLITEAL ARTERY Left 06/16/2020   Procedure: THROMBECTOMY OF BYPASS GRAFT FEMORAL-POPLITEAL ARTERY and TIBIAL ARTERY;  Surgeon: Marty Heck, MD;  Location: [place];  Service: Vascular;  Laterality: Left;   THROMBECTOMY OF BYPASS GRAFT FEMORAL- POPLITEAL ARTERY Left 12/31/2020   Procedure: THROMBECTOMY OF LEFT COMMON FEMORAL TO BELOW KNEE POPLITEAL ARTERY BYPASS GRAFT;  Surgeon: Marty Heck, MD;  Location: [place];  Service: Vascular;  Laterality: Left;   VEIN HARVEST Left 12/31/2020   Procedure: Left Greater Saphenous Vein Harvest;  Surgeon: Marty Heck, MD;  Location: [place];  Service: Vascular;  Laterality: Left;   Patient Active Problem List   Diagnosis Date Noted   Peripheral artery disease ([place]) 12/31/2020   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[place]) 05/12/2020   Thrombosis of femoro-popliteal bypass graft ([place]) 05/12/2020   Critical lower limb ischemia ([place]) 12/11/2016   Claudication in peripheral vascular disease ([place]) 11/28/2016   HTN (hypertension) 10/09/2014   Tobacco use 10/09/2014   PAD (peripheral artery disease) ([place])     REFERRING DIAG: 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  PAIN:  Are you having pain?  Yes: NPRS scale: arrived to PT 0/10.  Pain location: distal residual limb Pain description: cramping when he wakes up, soreness walking & aches when sitting with prosthesis. Aggravating factors: walking with sudden increases in time Relieving factors: wearing prosthesis  OBJECTIVE:    POSTURE: 05/19/21:  rounded shoulders, forward head, flexed trunk , and weight shift right   LE ROM:   ROM P:passive  A:active Right 05/19/2021 Left 05/19/2021 Left 08/11/2021  Hip flexion       Hip extension   -15* standing -5* standing  Hip abduction       Hip adduction       Hip internal rotation       Hip external rotation       Knee flexion       Knee extension       Ankle dorsiflexion       Ankle plantarflexion       Ankle inversion       Ankle eversion        (Blank rows = not tested) MMT:   MMT Right 05/19/2021 Left 05/19/2021 Left 08/11/21  Hip flexion       Hip extension   3/5 Gross functional  4/5 Gross functional  Hip abduction   3/5 Gross functional 4/5 Gross functional  Hip adduction       Hip internal rotation       Hip external rotation       Knee flexion       Knee extension       Ankle dorsiflexion       Ankle plantarflexion       Ankle inversion       Ankle eversion       (Blank rows = not tested)   TRANSFERS: Sit to stand: 05/19/21:  SBA and requires UE assist with armrests & RW touch to stabilize Stand to sit: 05/19/21:  SBA and requires UE assist with armrests & RW stabilization   GAIT: 09/13/2021: Pt amb, neg curb and ramp with rollator and TFA prosthesis modified independent Pt amb household distances with cane SBA  08/10/2021:  Pt neg curb with RW & TFA prosthesis modified independent. Pt neg ramp with RW & TFA prosthesis with verbal cues on hand position on RW to descend. Pt neg stairs with single rail & cane safely modified  independent.   08/09/2021: pt amb 516' in 9 min with RW & TFA prosthesis modified independent. Post gait HR 67 SpO2 97%  Gait velocity: 1.10 ft/sec  He had one misstep that he self corrected.    Pattern: step to with decreased right step length, left hip hike.  Good prosthetic foot clearance, minimal abduction intermittently noted.   05/19/21:  Gait pattern: step to pattern, decreased step length- Right, decreased stance time- Left, decreased hip/knee flexion- Left, circumduction- Left, Left hip hike, antalgic, trunk flexed, abducted- Left, and poor foot clearance- Left  Distance walked: 93' Assistive device utilized: Environmental consultant - 2 wheeled and TFA prosthesis  excessive UE weight bearing Level of assistance: SBA   FUNCTIONAL TESTs:  09/13/2021 TUG with cane stand alone tip:   Std TUG 62.96 sec with SBA, 46.47 sec SBA  Cog TUG 61.59 sec with SBA with pt describing how to build sunroom, able to talk entire time including turns  Manual TUG 57.50 sec SBA carrying open bottle of water with no spills  Cog + Manual TUG 70.93 sec SBA  08/11/2021  TUG with cane stand alone tip:  PT had pt walk one TUG path with minA for warm-up.  Std TUG 95.03 sec with minA  Cog TUG 146.38 sec with minA with pt describing how to build doghouse, able to talk entire time including turns.  Manual TUG 137.65 sec carrying open bottle of water with no spills.  09/27/2021 Berg Balance 45/56 & tasks of Berg with cane support 49/56  [facility] PT Assessment - 09/27/21 0930       Berg Balance Test   Sit to Stand Able to stand without using hands and stabilize independently    Standing Unsupported Able to stand safely 2 minutes    Sitting with Back Unsupported but Feet Supported on Floor or Stool Able to sit safely and securely 2 minutes    Stand to Sit Sits safely with minimal use of hands    Transfers Able to transfer safely, definite need of hands    Standing Unsupported with Eyes Closed Able to stand 10 seconds safely     Standing Unsupported with Feet Together Able to place feet together independently and stand 1 minute safely    From Standing, Reach Forward with Outstretched Arm Can reach confidently >25 cm (10")    From Standing Position, Pick  up Object from [place] to pick up shoe, needs supervision   with cane support = 4   From Standing Position, Turn to Look Behind Over each Shoulder Looks behind one side only/other side shows less weight shift   with cane support = 3   Turn 360 Degrees Needs close supervision or verbal cueing   with cane support = 2   Standing Unsupported, Alternately Place Feet on Step/Stool Needs assistance to keep from falling or unable to try   with cane support = 2   Standing Unsupported, One Foot in [place] to plae foot ahead of the other independently and hold 30 seconds    Standing on One Leg Able to lift leg independently and hold > 10 seconds    Total Score 45             08/09/2021  Berg Balance 34/56 & tasks of Berg with cane support 46/56  [facility] PT Assessment - 09/27/21 0930       Berg Balance Test   Sit to Stand Able to stand without using hands and stabilize independently    Standing Unsupported Able to stand safely 2 minutes    Sitting with Back Unsupported but Feet Supported on Floor or Stool Able to sit safely and securely 2 minutes    Stand to Sit Sits safely with minimal use of hands    Transfers Able to transfer safely, definite need of hands    Standing Unsupported with Eyes Closed Able to stand 10 seconds safely    Standing Unsupported with Feet Together Able to place feet together independently and stand 1 minute safely    From Standing, Reach Forward with Outstretched Arm Can reach confidently >25 cm (10")    From Standing Position, Pick up Object from Floor Able to pick up shoe, needs supervision   with cane support = 4   From Standing Position, Turn to Look Behind Over each Shoulder Looks behind one side only/other side shows less weight shift    with cane support = 3   Turn 360 Degrees Needs close supervision or verbal cueing   with cane support = 2   Standing Unsupported, Alternately Place Feet on Step/Stool Needs assistance to keep from falling or unable to try   with cane support = 2   Standing Unsupported, One Foot in Front Able to plae foot ahead of the other independently and hold 30 seconds    Standing on One Leg Able to lift leg independently and hold > 10 seconds    Total Score 45             [facility] PT Assessment - 09/27/21 0930       Berg Balance Test   Sit to Stand Able to stand without using hands and stabilize independently    Standing Unsupported Able to stand safely 2 minutes    Sitting with Back Unsupported but Feet Supported on Floor or Stool Able to sit safely and securely 2 minutes    Stand to Sit Sits safely with minimal use of hands    Transfers Able to transfer safely, definite need of hands    Standing Unsupported with Eyes Closed Able to stand 10 seconds safely    Standing Unsupported with Feet Together Able to place feet together independently and stand 1 minute safely    From Standing, Reach Forward with Outstretched Arm Can reach confidently >25 cm (10")    From Standing Position, Pick up Object from [place]  to pick up shoe, needs supervision   with cane support = 4   From Standing Position, Turn to Look Behind Over each Shoulder Looks behind one side only/other side shows less weight shift   with cane support = 3   Turn 360 Degrees Needs close supervision or verbal cueing   with cane support = 2   Standing Unsupported, Alternately Place Feet on Step/Stool Needs assistance to keep from falling or unable to try   with cane support = 2   Standing Unsupported, One Foot in Front Able to plae foot ahead of the other independently and hold 30 seconds    Standing on One Leg Able to lift leg independently and hold > 10 seconds    Total Score 45             Berg Balance Scale: 05/19/21:  17/56   [facility]  PT Assessment - 05/19/21 0900                Standardized Balance Assessment    Standardized Balance Assessment Berg Balance Test          Berg Balance Test    Sit to Stand Needs minimal aid to stand or to stabilize     Standing Unsupported Able to stand 2 minutes with supervision     Sitting with Back Unsupported but Feet Supported on Floor or Stool Able to sit safely and securely 2 minutes     Stand to Sit Controls descent by using hands     Transfers Able to transfer safely, definite need of hands     Standing Unsupported with Eyes Closed Unable to keep eyes closed 3 seconds but stays steady     Standing Unsupported with Feet Together Needs help to attain position but able to stand for 30 seconds with feet together     From Standing, Reach Forward with Outstretched Arm Reaches forward but needs supervision     From Standing Position, Pick up Object from Floor Unable to try/needs assist to keep balance     From Standing Position, Turn to Look Behind Over each Shoulder Needs assist to keep from losing balance and falling     Turn 360 Degrees Needs assistance while turning     Standing Unsupported, Alternately Place Feet on Step/Stool Needs assistance to keep from falling or unable to try     Standing Unsupported, One Foot in [employer] balance while stepping or standing     Standing on One Leg Unable to try or needs assist to prevent fall     Total Score 17     Berg comment: BERG  < 36 high risk for falls (close to 100%) 46-51 moderate (>50%)   37-45 significant (>80%) 52-55 lower (> 25%)                   CURRENT PROSTHETIC WEAR ASSESSMENT: 05/19/21:  Patient is dependent with: skin check, residual limb care, care of non-amputated limb, prosthetic cleaning, ply sock cleaning, correct ply sock adjustment, proper wear schedule/adjustment, and proper weight-bearing schedule/adjustment Donning prosthesis: SBA Doffing prosthesis: Modified independence Prosthetic wear tolerance: 5-6  hours/day, 16 of 16 days since delivery.  Prosthetic weight bearing tolerance: 5 minutes with partial weight on prosthesis & intermittent support with RW Residual limb condition: 2 invaginated areas with one not clean, 1 small area on incision that may be suture working way out, dry skin, cylinderical shape, nonpitting edema,  Prosthetic description: silicon liner with velcro lanyard, ischial containment  socket with flexible inner liner, SAFETY knee (single axis nonhydraulic), K2 flexible keel foot    TODAY'S TREATMENT:  09/27/21 Prosthetic Training with Transfemoral Prosthesis: Berg Balance Test as noted above PT and patient discussed remaining prosthetic care concerns including managing residual limb pain and adjusting strap, pt verbalized understanding PT explained HEP suggestions for long term improvement including walking at home with cane and walking in community with rollator, standing doorway stretch for hip flexor tightness, sink exercises and walking to improve balance, pt verbalized understanding  09/26/21 Prosthetic Training with Transfemoral Prosthesis: PT education on checking strap when feeling and demo checking strap, pt verbalized understanding and tightened strap Pt ambulate 100' with cane mod. Independent Pt ambulate 41' with cane supervision / verbal cueing for full turn and controlled sit but no physical assist needed PT education on rollator set up, basket, brakes without wheel turning and pt verbalized understanding and ability to fix brakes PT education on indication for changing sock ply and proper donning, pt verbalized understanding and stood in socket with 1-ply to assess for feeling of too much  09/20/21 Prosthetic Training with Transfemoral Prosthesis: PT education on using rollator in community, UE cane support for walking vs standing and pt verbalized understanding and ability to bring rollator to clinic next session Pt ambulate 20' with 5 step side-stepping  between obstacles x 2 and 180* turn, required minA handhold assist to turn to side for first lateral step Pt ambulate 20' with 180* turn while holding plate with tennis balls to simulate manual load of carrying dinner plate within household with conversation, SBA Pt ambulate 100' with weaving around obstacles to simulate furniture in home and stepping over threshold with SBA. Pt required reminders of full step over threshold with prosthesis for safety and knee stability PT education on continuing HEP suggestions of both a daily longer walk with rollator for building endurance and multiple shorter walks at household with cane, taking standing rest breaks when appropriate. Pt verbalized understanding  09/19/21 Prosthetic Training with Transfemoral Prosthesis: PT education on checking strap when prosthesis feels heavier than usual and demo checking strap, pt verbalized understanding PT suggestion for static stance with cane in Lt hand for increased WB on prosthesis, pt returned demo x 1 min and verbalized understanding Pt ambulate 80f with cane in RUE and supervision, pt slowed speed to increase weight shift on prosthesis and spending more even time WB bil.  Neuro Re-ed:  Green band, no UE assist and cueing for controlled eccentric throughout  Alternating rows x 10, bil. rows x 10 reps  Alternating shoulder extension with elbows ext. x 10, bil. x 10 reps  Shoulder ER and horizontal abduction x 10 ea. UE Red band in LUE and cane in RUE, cueing for controlled eccentric   Rt side stepping x 2, Lt side stepping x 1, forward x 1, backward x 1. Pt self-recovered 2 instances of prosthetic knee bucking and corrected for knee extension with side stepping   HOME EXERCISE PROGRAM Access Code: [email or web address]: [email or web address] Date: 07/04/2021 Prepared by: RJamey Reas Exercises - Seated Hamstring Stretch with Strap  - 2 x daily - 7 x weekly - 1 sets - 2-3 reps - 30 seconds hold -  Modified Burgner Stretch  - 2 x daily - 7 x weekly - 1 sets - 2 reps - 30 seconds hold - Supine Dynamic Modified [place] Quad and Hip Flexor Dynamic Stretch  - 2 x daily - 7 x weekly - 1 sets - 2 reps -  30 seconds hold - Standing posture with back to counter  - 2 x daily - 7 x weekly - 1 sets - 1 reps - 5-10 minutes hold - Upright Stance at Door Frame Single Arm  - 3-6 x daily - 7 x weekly - 1 sets - 2 reps - 2 deep breathes hold - Upright Stance at Door Frame with Both Arms  - 3-6 x daily - 7 x weekly - 1 sets - 2 reps - 2 deep breathes hold - feet together eyes open on floor  - 1 x daily - 4-7 x weekly - 1 sets - 10 reps - 2 seconds hold - Feet Apart with Eyes Closed with Head Motions  - 1 x daily - 4-7 x weekly - 1 sets - 10 reps - 2 seconds hold - Wide stance on Foam Pad head movements  - 1 x daily - 4-7 x weekly - 1 sets - 10 reps - 2 seconds hold - Standing Quarter Turn with Counter Support  - 1 x daily - 4-7 x weekly - 1 sets - 10 reps - Side Stepping with Counter Support  - 1 x daily - 5-7 x weekly - 1 sets - 10 reps - Backward Walking with Counter Support  - 1 x daily - 5-7 x weekly - 1 sets - 10 reps    05/24/21 1151  PT Education  Education Details HEP at sink & residual limb pain vs phantom sensation vs phantom pain  Person(s) Educated Patient  Methods Explanation;Demonstration;Tactile cues;Verbal cues;Handout  Comprehension Verbalized understanding;Returned demonstration;Verbal cues required;Tactile cues required;Need further instruction   ASSESSMENT:  CLINICAL IMPRESSION: Emet was discharged from PT services today after meeting all short term and long term goals with significant improvement in prosthetic management and functional ability. He is independent with prosthetic care elements and reports full day wear time with no pain or skin concerns. His TUG, cognitive TUG, manual TUG, and Berg balance scores have all improved since evaluation and indicate reduced fall risk and  improved safety for household and community navigation. He is able to use a cane to ambulate household environments and uses a rollator for community ambulation. He was discharged to home with a review of suggested home exercises and walking and verbalized understanding with suggestions, reporting no further questions or concerns.   OBJECTIVE IMPAIRMENTS Abnormal gait, decreased activity tolerance, decreased balance, decreased endurance, decreased knowledge of condition, decreased knowledge of use of DME, decreased mobility, decreased ROM, decreased strength, impaired flexibility, postural dysfunction, prosthetic dependency , and pain.    ACTIVITY LIMITATIONS community activity and occupation.    PERSONAL FACTORS Fitness, Time since onset of injury/illness/exacerbation, and 3+ comorbidities: see PMH  are also affecting patient's functional outcome.    REHAB POTENTIAL: Good   CLINICAL DECISION MAKING: Evolving/moderate complexity   EVALUATION COMPLEXITY: Moderate     GOALS: Goals reviewed with patient? Yes   SHORT TERM GOALS: Target date: 09/08/2021   Patient verbalizes how to decrease residual limb pain. Goal status: met 09/07/21 2.  Patient tolerates prosthesis >90% awake hrs /day without skin issues or limb pain </= 2/10 >/= 75% of days after standing / walking. Goal status: met 09/07/21   3. Patient ambulates 100' with cane stand alone tip without hand hold assist & prosthesis with min guard. Goal status: met 09/12/21 SBA  LONG TERM GOALS: Target date: 07/02/9474 (reset at re-certification)   Patient demonstrates & verbalized understanding of prosthetic care including issues to address limb pain to enable safe  utilization of prosthesis. Baseline: SEE OBJECTIVE DATA Goal status: met 09/27/2021   Patient tolerates prosthesis wear >90% of awake hours without skin or limb pain issues. Baseline: SEE OBJECTIVE DATA Goal status: met 09/26/2021  Merrilee Jansky Balance >/= 45/56 to indicate lower fall  risk Baseline: SEE OBJECTIVE DATA Goal status: met 09/27/2021   Patient ambulates >500' with LRAD & prosthesis modified independent. Baseline: SEE OBJECTIVE DATA Goal status: met 08/09/2021 with RW   Patient negotiates ramps, curbs & stairs with single rail with LRAD & prosthesis modified independent. Baseline: SEE OBJECTIVE DATA Goal status: met 08/11/2021 with RW, ramp and curb 09/12/21 with rollator  6.  Patient ambulates 100' with cane & prosthesis modified independent.  Baseline: SEE OBJECTIVE DATA Goal status: met 09/26/21  7. Timed Up & Go with cane stand alone tip modified independent:  Std TUG <60sec, Cog TUG <11mn, Manual TUG <2 min Baseline: SEE OBJECTIVE DATA Goal status: met 09/13/2021   PLAN: PT FREQUENCY: 2x/week   PT DURATION: 8 weeks   PLANNED INTERVENTIONS: Therapeutic exercises, Therapeutic activity, Neuromuscular re-education, Balance training, Gait training, Patient/Family education, Stair training, Vestibular training, Prosthetic training, and DME instructions, Physical Performance Testing   PLAN FOR NEXT SESSION:  Discharge today 09/27/21  KJana Hakim Student-PT 09/27/2021, 2:36 PM  This entire session of physical therapy was performed under the direct supervision of PT signing evaluation /treatment. PT reviewed note and agrees.  RJamey Reas PT, DPT 09/27/2021, 3:21 PM

## 2021-09-28 ENCOUNTER — Encounter: Payer: Medicare Other | Admitting: Physical Therapy

## 2021-10-25 IMAGING — DX DG FOOT COMPLETE 3+V*L*
3 series · 3 of 3 positions shown · non-contrast
Comparison: None.

CLINICAL DATA: Left foot pain.  Numbness.

EXAM:
LEFT FOOT - COMPLETE 3+ VIEW

[foot ap]
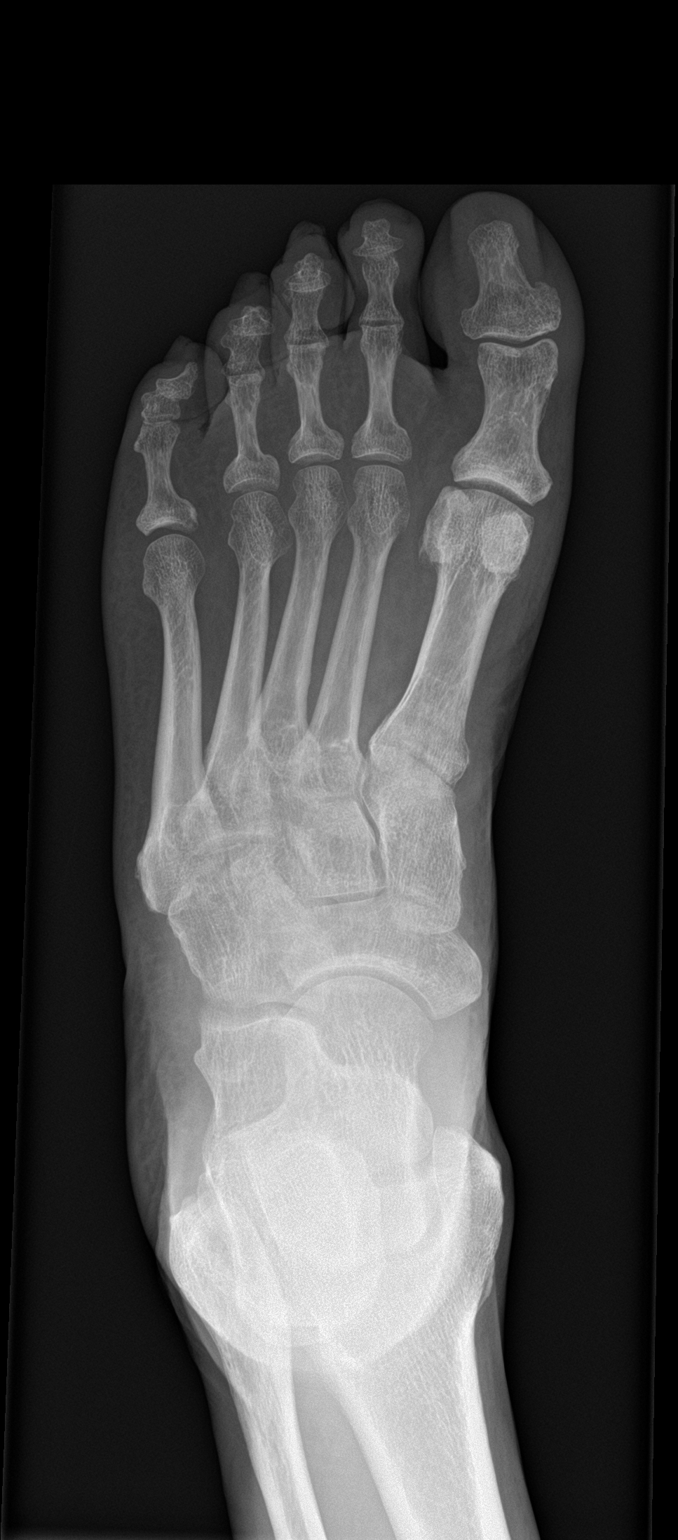

[foot obl]
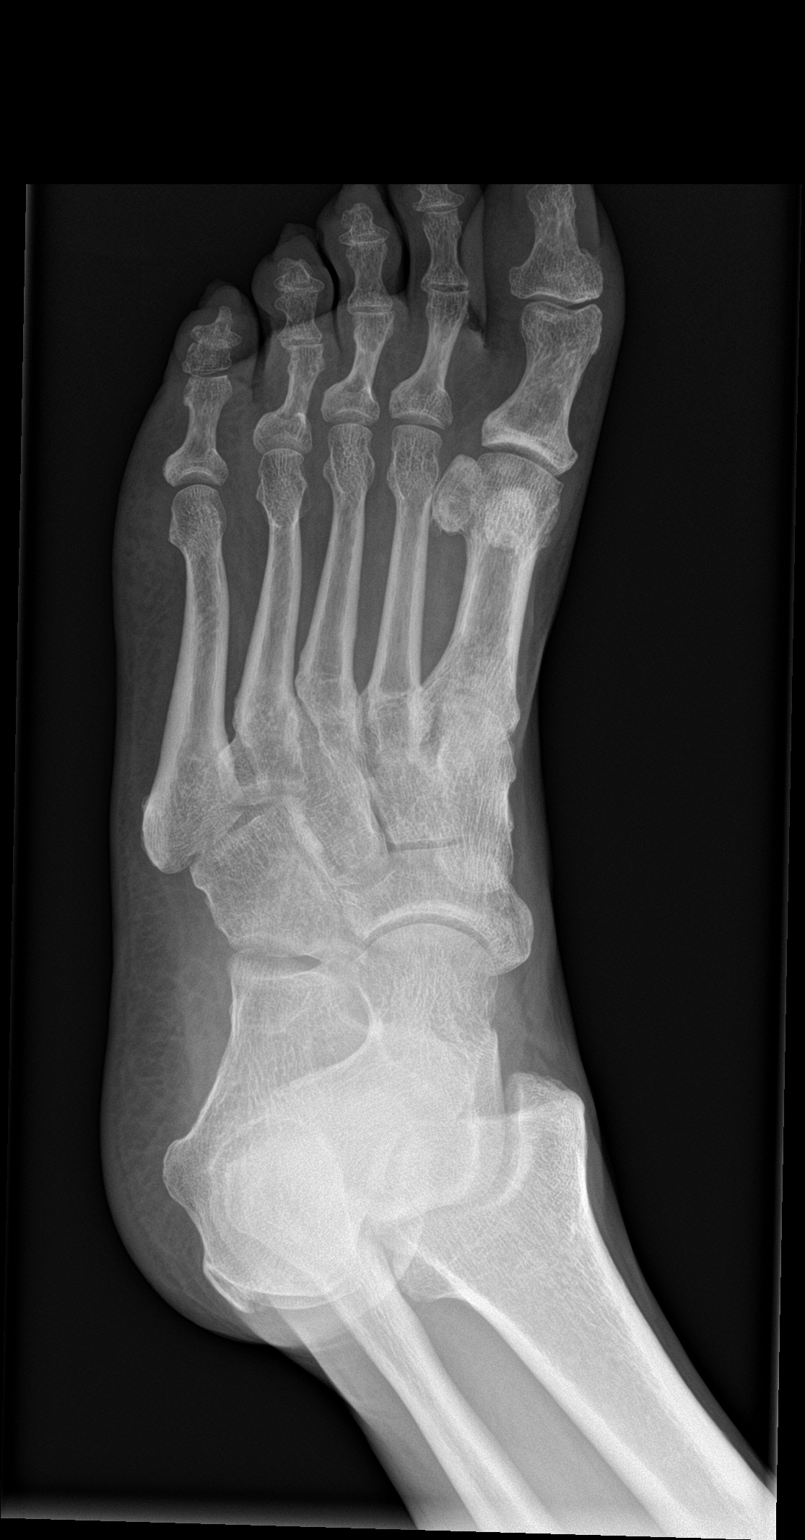

[foot lat]
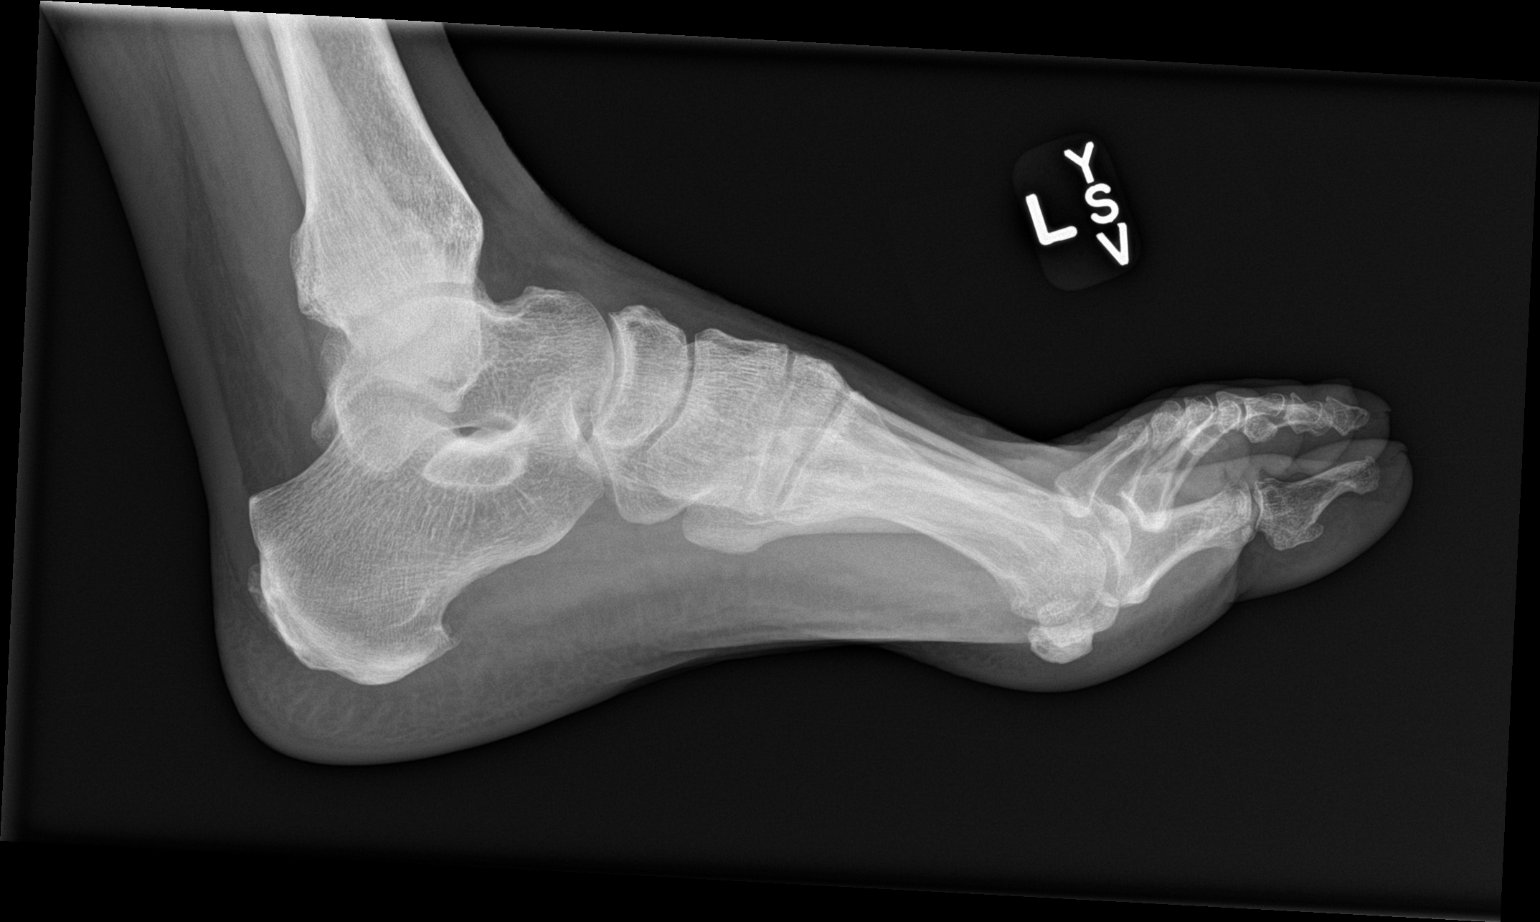

[3 of 3 positions shown; findings below may reference images not displayed]

FINDINGS: There is no evidence of fracture or dislocation. Minimal
osteoarthritis of the first metatarsal phalangeal joint. No erosion
or bony destruction. Tiny plantar calcaneal spur. Soft tissues are
unremarkable.
IMPRESSION: 1. Minimal osteoarthritis of the first metatarsophalangeal joint.
2. Tiny plantar calcaneal spur.

## 2022-01-16 ENCOUNTER — Other Ambulatory Visit: Payer: Self-pay | Admitting: *Deleted

## 2022-01-16 MED ORDER — CLOPIDOGREL BISULFATE 75 MG PO TABS: 75.0000 mg | ORAL_TABLET | Freq: Every day | ORAL | 2 refills | Status: DC

## 2022-01-24 ENCOUNTER — Other Ambulatory Visit: Payer: Self-pay | Admitting: *Deleted

## 2022-01-24 DIAGNOSIS — I739 Peripheral vascular disease, unspecified: Secondary | ICD-10-CM

## 2022-01-24 DIAGNOSIS — T82898A Other specified complication of vascular prosthetic devices, implants and grafts, initial encounter: Secondary | ICD-10-CM

## 2022-01-24 DIAGNOSIS — I70229 Atherosclerosis of native arteries of extremities with rest pain, unspecified extremity: Secondary | ICD-10-CM

## 2022-01-24 DIAGNOSIS — I779 Disorder of arteries and arterioles, unspecified: Secondary | ICD-10-CM

## 2022-02-06 NOTE — Progress Notes (Unsigned)
HISTORY AND PHYSICAL     CC:  follow up. Requesting Provider:  Mirna Mires, MD  HPI: [name] who is here today for follow up for PAD.  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 Chestine Spore.   He did have left hypogastric artery coil embolization with endovascular repair of the left CIA on 11/4 2022 by Dr. [name].  Pt has hx of PTA and stenting of the right SFA with viabohn stent in 2016 at [facility] per Care Everywhere.  Pt was last seen 06/14/2021 and at that time, he was not having any claudication, rest pain or non healing wounds on the right leg.  He was in PT for prosthesis training.    The pt returns today for follow up and here with his wife of about 50 years.  He states he is doing well with his right foot.  He does not have any rest pain or non healing wounds.  He does have occasional cramping in his calf when he walks but does not limit him.  He has a prosthesis on the left leg that he states moves on him.  He has an appt in February with [facility].  He states that the gabapentin he was prescribed at his last visit helped a lot.  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  He is still smoking.  His wife states they need to make an appt with podiatrist for toe nail trim.   The pt is on a statin for cholesterol management.    The pt is on an aspirin.    Other AC:  Eliquis/Plavix The pt is on BB, ARB, diuretic for hypertension.  The pt does  have diabetes. Tobacco hx: current   Past Medical History:  Diagnosis Date   COVID 2022   Hypertension    PVD (peripheral vascular disease) [facility])     Past Surgical History:  Procedure Laterality Date   ABDOMINAL AORTAGRAM Left 12/13/2016   ABDOMINAL AORTOGRAM W/LOWER EXTREMITY/notes 12/13/2016   ABDOMINAL AORTOGRAM W/LOWER EXTREMITY N/A 11/28/2016   Procedure: ABDOMINAL AORTOGRAM W/LOWER EXTREMITY;  Surgeon: Yates Decamp, MD;  [facility] IN[facility];   Service: Cardiovascular;  Laterality: N/A;  bilateral   ABDOMINAL AORTOGRAM W/LOWER EXTREMITY N/A 12/13/2016   Procedure: ABDOMINAL AORTOGRAM W/LOWER EXTREMITY;  Surgeon: Maeola Harman, MD;  Location: [facility] INVASIVE CV LAB;  Service: Cardiovascular;  Laterality: N/A;  unilater left leg   ABDOMINAL AORTOGRAM W/LOWER EXTREMITY N/A 01/09/2018   Procedure: ABDOMINAL AORTOGRAM W/LOWER EXTREMITY;  Surgeon: Cephus Shelling, MD;  [facility]: [facility] IN[facility] LAB;  Service: Cardiovascular;  Laterality: N/A;   ABDOMINAL AORTOGRAM W/LOWER EXTREMITY Left 09/22/2019   Procedure: ABDOMINAL AORTOGRAM W/LOWER EXTREMITY;  Surgeon: Maeola Harman, MD;  Location[facility] INVASIVE CV LAB;  Service: Cardiovascular;  Laterality: Left;   ABDOMINAL AORTOGRAM W/LOWER EXTREMITY Left 12/02/2020   Procedure: ABDOMINAL AORTOGRAM W/LOWER EXTREMITY;  Surgeon: Cephus Shelling, MD;  [facility];  Service: Cardiovascular;  Laterality: Left;   ABDOMINAL AORTOGRAM W/LOWER EXTREMITY N/A 12/22/2020   Procedure: ABDOMINAL AORTOGRAM W/LOWER EXTREMITY;  Surgeon: Victorino Sparrow, MD;  Location: [facility] INVASIVE CV LAB;  Service: Cardiovascular;  Laterality: N/A;   AMPUTATION Left 02/11/2021   Procedure: LEFT ABOVE KNEE AMPUTATION;  Surgeon: Cephus Shelling, MD;  Location: [facility] OR;  Service: Vascular;  Laterality: Left;   BYPASS GRAFT POPLITEAL TO TIBIAL Left 12/31/2020   Procedure: LEFT  JUMP GRAFT REVISION BELOW KNEE POPLITEAL TO PERONEAL;  Surgeon: Cephus Shellinglark, Christopher J, MD;  [facility];  Service: Vascular;  Laterality: Left;   EMBOLIZATION Left 12/03/2020   Procedure: EMBOLIZATION;  Surgeon: Leonie DouglasHawken, Despain N, MD;  [facility];  Service: Cardiovascular;  Laterality: Left;   FEMORAL-POPLITEAL BYPASS GRAFT Left 12/14/2016   Procedure: BYPASS GRAFT COMMON FEMORAL- BELOW KNEE POPLITEAL ARTERY with Bovine Patch to Below the knee poplitieal artery.;  Surgeon: Fransisco Hertzhen, Brian L, MD;  [facility];  Service: Vascular;  Laterality: Left;   FEMORAL-POPLITEAL BYPASS GRAFT Left 01/28/2018   Procedure: BYPASS GRAFT FEMORAL-BELOW KNEE POPLITEAL ARTERY REDO with propaten vascular graft removable ring;  Surgeon: Cephus Shellinglark, Christopher J, MD;  [facility] OR;  Service: Vascular;  Laterality: Left;   FEMUR SURGERY Right 2009   pt. has a rod in that leg   LOWER EXTREMITY ANGIOGRAPHY Left 11/28/2016   Procedure: Lower Extremity Angiography;  Surgeon: Yates DecampGanji, Jay, MD;  [facility];  Service: Cardiovascular;  Laterality: Left;  lysis followup lower leg   PATCH ANGIOPLASTY Left 01/28/2018   Procedure: PATCH ANGIOPLASTY USING Livia SnellenXENOSURE BIOLOGIC PATCH;  Surgeon: Cephus Shellinglark, Christopher J, MD;  [facility];  Service: Vascular;  Laterality: Left;   PATCH ANGIOPLASTY Left 06/16/2020   Procedure: PATCH ANGIOPLASTY OF LEFT BELOW KNEE POPITEAL ARTERY;  Surgeon: Cephus Shellinglark, Christopher J, MD;  [facility];  Service: Vascular;  Laterality: Left;   PERIPHERAL VASCULAR BALLOON ANGIOPLASTY  11/28/2016   Procedure: PERIPHERAL VASCULAR BALLOON ANGIOPLASTY;  Surgeon: Yates DecampGanji, Jay, MD;  [facility];  Service: Cardiovascular;;  SFA   PERIPHERAL VASCULAR BALLOON ANGIOPLASTY  12/03/2020   Procedure: PERIPHERAL VASCULAR BALLOON ANGIOPLASTY;  Surgeon: Leonie DouglasHawken, Haro N, MD;  [facility];  Service: Cardiovascular;;  TP Trunk and Peroneal   PERIPHERAL VASCULAR CATHETERIZATION N/A 08/11/2014   Procedure: Lower Extremity Angiography;  Surgeon: Yates DecampJay Ganji, MD;  [facility];  Service: Cardiovascular;  Laterality: N/A;   PERIPHERAL VASCULAR INTERVENTION Left 09/22/2019   Procedure: PERIPHERAL VASCULAR INTERVENTION;  Surgeon: Maeola Harmanain, Brandon Christopher, MD;  [facility];  Service: Cardiovascular;  Laterality: Left;   PERIPHERAL VASCULAR INTERVENTION Left 09/23/2019   Procedure: PERIPHERAL VASCULAR INTERVENTION;  Surgeon: Nada LibmanBrabham, Vance W, MD;  [facility]  INV[facility] CV LAB;  Service: Cardiovascular;  Laterality: Left;   PERIPHERAL VASCULAR INTERVENTION Left 05/13/2020   Procedure: PERIPHERAL VASCULAR INTERVENTION;  Surgeon: Cephus Shellinglark, Christopher J, MD;  [facility];  Service: Cardiovascular;  Laterality: Left;   PERIPHERAL VASCULAR INTERVENTION Left 12/02/2020   Procedure: PERIPHERAL VASCULAR INTERVENTION;  Surgeon: Cephus Shellinglark, Christopher J, MD;  [facility];  Service: Cardiovascular;  Laterality: Left;   PERIPHERAL VASCULAR INTERVENTION  12/03/2020   Procedure: PERIPHERAL VASCULAR INTERVENTION;  Surgeon: Leonie DouglasHawken, Neria N, MD;  [facility];  Service: Cardiovascular;;   PERIPHERAL VASCULAR INTERVENTION Left 12/22/2020   Procedure: PERIPHERAL VASCULAR INTERVENTION;  Surgeon: Victorino Sparrowobins, Joshua E, MD;  [facility];  Service: Cardiovascular;  Laterality: Left;   PERIPHERAL VASCULAR THROMBECTOMY  11/28/2016   Procedure: PERIPHERAL VASCULAR THROMBECTOMY;  Surgeon: Yates DecampGanji, Jay, MD;  [facility];  Service: Cardiovascular;;  Profunda   PERIPHERAL VASCULAR THROMBECTOMY  11/28/2016   Procedure: PERIPHERAL VASCULAR THROMBECTOMY;  Surgeon: Yates DecampGanji, Jay, MD;  [facility];  Service: Cardiovascular;;   PERIPHERAL VASCULAR THROMBECTOMY N/A 05/12/2020   Procedure: PERIPHERAL VASCULAR THROMBECTOMY-Left Leg;  Surgeon:  Cherre Robins, MD;  Location: [place] CV LAB;  Service: Cardiovascular;  Laterality: N/A;   PERIPHERAL VASCULAR THROMBECTOMY N/A 05/13/2020   Procedure: PERIPHERAL VASCULAR THROMBECTOMY- LYSIS RECHECK;  Surgeon: Marty Heck, MD;  Location: [place] CV LAB;  Service: Cardiovascular;  Laterality: N/A;   PERIPHERAL VASCULAR THROMBECTOMY N/A 12/02/2020   Procedure: PERIPHERAL VASCULAR THROMBECTOMY;  Surgeon: Marty Heck, MD;  Location: [place] CV LAB;  Service: Cardiovascular;  Laterality: N/A;   PERIPHERAL VASCULAR THROMBECTOMY N/A 12/03/2020    Procedure: LYSIS RECHECK;  Surgeon: Cherre Robins, MD;  Location: [place] CV LAB;  Service: Cardiovascular;  Laterality: N/A;   PULMONARY THROMBECTOMY N/A 09/23/2019   Procedure: LYSIS RECHECK;  Surgeon: Serafina Mitchell, MD;  Location: [place] CV LAB;  Service: Cardiovascular;  Laterality: N/A;   THROMBECTOMY FEMORAL ARTERY Left 12/14/2016   Procedure: THROMBECTOMY PROFUNDA FEMORAL ARTERY;  Surgeon: Conrad [place], MD;  Location: [place];  Service: Vascular;  Laterality: Left;   THROMBECTOMY FEMORAL ARTERY Left 06/16/2020   Procedure: Redo exposure ot Left below knee popiteal artery;  Surgeon: Marty Heck, MD;  Location: [place];  Service: Vascular;  Laterality: Left;   THROMBECTOMY OF BYPASS GRAFT FEMORAL- POPLITEAL ARTERY Left 06/16/2020   Procedure: THROMBECTOMY OF BYPASS GRAFT FEMORAL-POPLITEAL ARTERY and TIBIAL ARTERY;  Surgeon: Marty Heck, MD;  Location: [place];  Service: Vascular;  Laterality: Left;   THROMBECTOMY OF BYPASS GRAFT FEMORAL- POPLITEAL ARTERY Left 12/31/2020   Procedure: THROMBECTOMY OF LEFT COMMON FEMORAL TO BELOW KNEE POPLITEAL ARTERY BYPASS GRAFT;  Surgeon: Marty Heck, MD;  Location: [place];  Service: Vascular;  Laterality: Left;   VEIN HARVEST Left 12/31/2020   Procedure: Left Greater Saphenous Vein Harvest;  Surgeon: Marty Heck, MD;  Location: [place];  Service: Vascular;  Laterality: Left;    No Known Allergies  Current Outpatient Medications  Medication Sig Dispense Refill   aspirin EC 81 MG EC tablet Take 1 tablet (81 mg total) by mouth daily at 6 (six) AM. Swallow whole.     atenolol (TENORMIN) 50 MG tablet Take 50 mg by mouth in the morning.     clopidogrel (PLAVIX) 75 MG tablet Take 1 tablet (75 mg total) by mouth daily. 30 tablet 2   ELIQUIS 5 MG TABS tablet Take by mouth. (Patient not taking: Reported on 06/14/2021)     gabapentin (NEURONTIN) 100 MG capsule Take 1 capsule (100 mg total) by mouth daily. 30 capsule 3    HYDROcodone-acetaminophen (NORCO) 5-325 MG tablet Take 1 tablet by mouth every 4 (four) hours as needed for moderate pain. 30 tablet 0   losartan-hydrochlorothiazide (HYZAAR) 100-25 MG tablet Take 1 tablet by mouth in the morning.     metFORMIN (GLUCOPHAGE) 500 MG tablet Take 1 tablet (500 mg total) by mouth daily with breakfast. 30 tablet 0   rosuvastatin (CRESTOR) 20 MG tablet Take 20 mg by mouth at bedtime.     traMADol (ULTRAM) 50 MG tablet Take 1 tablet (50 mg total) by mouth every 8 (eight) hours as needed. 30 tablet 0   traMADol (ULTRAM) 50 MG tablet Take 1 tablet (50 mg total) by mouth every 6 (six) hours as needed. 30 tablet 0   No current facility-administered medications for this visit.    Family History  Problem Relation Age of Onset   Hypertension Mother    Diabetes Mother    Hypertension Father     Social History   Socioeconomic History  Marital status: Married    Spouse name: Not on file   Number of children: Not on file   Years of education: Not on file   Highest education level: Not on file  Occupational History   Occupation: retired  Tobacco Use   Smoking status: Former    Packs/day: 0.75    Years: 56.00    Total pack years: 42.00    Types: Cigarettes    Quit date: 03/30/2021    Years since quitting: 0.8    Passive exposure: Never   Smokeless tobacco: Never   Tobacco comments:    \Started back to smoking 1 cigarette a day  Vaping Use   Vaping Use: Never used  Substance and Sexual Activity   Alcohol use: Not Currently    Alcohol/week: 10.0 standard drinks of alcohol    Types: 10 Shots of liquor per week    Comment:  01/29/2018 "1-2 shots per night"   Drug use: No   Sexual activity: Not on file  Other Topics Concern   Not on file  Social History Narrative   Not on file   Social Determinants of Health   Financial Resource Strain: Not on file  Food Insecurity: Not on file  Transportation Needs: Not on file  Physical Activity: Not on file   Stress: Not on file  Social Connections: Not on file  Intimate Partner Violence: Not on file     REVIEW OF SYSTEMS:   [X]  denotes positive finding, [ ]  denotes negative finding Cardiac  Comments:  Chest pain or chest pressure:    Shortness of breath upon exertion:    Short of breath when lying flat:    Irregular heart rhythm:        Vascular    Pain in calf, thigh, or hip brought on by ambulation: x See HPI  Pain in feet at night that wakes you up from your sleep:     Blood clot in your veins:    Leg swelling:         Pulmonary    Oxygen at home:    Productive cough:     Wheezing:         Neurologic    Sudden weakness in arms or legs:     Sudden numbness in arms or legs:     Sudden onset of difficulty speaking or slurred speech:    Temporary loss of vision in one eye:     Problems with dizziness:         Gastrointestinal    Blood in stool:     Vomited blood:         Genitourinary    Burning when urinating:     Blood in urine:        Psychiatric    Major depression:         Hematologic    Bleeding problems:    Problems with blood clotting too easily:        Skin    Rashes or ulcers:        Constitutional    Fever or chills:      PHYSICAL EXAMINATION:  Today's Vitals   02/07/22 1032  BP: (!) 161/82  Pulse: 63  Resp: 20  Temp: 97.6 F (36.4 C)  TempSrc: Temporal  SpO2: 97%  Weight: 149 lb 4.8 oz (67.7 kg)  Height: 5\' 6"  (1.676 m)   Body mass index is 24.1 kg/m.   General:  WDWN in NAD; vital signs documented above  Gait: Not observed HENT: WNL, normocephalic Pulmonary: normal non-labored breathing , without wheezing Cardiac: regular HR, without carotid bruits Abdomen: soft, NT; aortic pulse is not palpable Skin: without rashes Vascular Exam/Pulses: Pedal pulses on the right are not palpable; toe nails are long; no wounds Musculoskeletal: no muscle wasting or atrophy  Neurologic: A&O X 3 Psychiatric:  The pt has Normal  affect.   Non-Invasive Vascular Imaging:   ABI's/TBI's on 02/07/2022: Right:  0.33/0.44 - Great toe pressure: 61 Left:  AKA  Arterial duplex on 02/07/2022: +----------+--------+-----+--------+----------+--------------------+  RIGHT    PSV cm/sRatioStenosisWaveform  Comments              +----------+--------+-----+--------+----------+--------------------+  CFA Distal92                   monophasic                      +----------+--------+-----+--------+----------+--------------------+  DFA      114                  biphasic                        +----------+--------+-----+--------+----------+--------------------+  SFA Prox                                 known occluded stent  +----------+--------+-----+--------+----------+--------------------+  SFA Mid                                  known occluded stent  +----------+--------+-----+--------+----------+--------------------+  SFA Distal42                   monophasicstent                 +----------+--------+-----+--------+----------+--------------------+  POP Prox  41                   monophasic                      +----------+--------+-----+--------+----------+--------------------+  POP Distal37                   monophasic                      +----------+--------+-----+--------+----------+--------------------+  ATA Distal32                   monophasic                      +----------+--------+-----+--------+----------+--------------------+  PTA Distal51                   monophasic                      +----------+--------+-----+--------+----------+--------------------+   Summary:  Right: Continued known occluded superficial femoral artery stent (proximal and mid segments) with reconstitution of the distal segment. Patent popliteal artery.    Previous ABI's/TBI's on 06/14/2021: Right:  0.38/0.46 - Great toe pressure: 54 Left:  AKA    ASSESSMENT/PLAN:: 72  y.o. 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 Chestine Spore.   He did have left hypogastric artery coil embolization with endovascular repair of the left CIA on 11/4  2022 by Dr. [name].     -pt ABI stable; duplex of RLE reveals known occluded SFA stent.  He does not have any non healing wounds or rest pain. -continue asa/statin/plavix. He is also on Eliquis. -pt will f/u in 6 months with ABI on the left.  He knows to call sooner if he has any issues. -I did sent an rx for Gabapentin 100mg  qhs #30 with 2 refills.  He was requesting to increase dose, but he has not had this in over a month.  Urged him to start with 100mg  qhs and increase in time if it is needed.  Discussed with him that his PCP will continue with the refills from here on out.  He expressed understanding.    , [facility] [phone]  Clinic MD:   [facility]

## 2022-02-07 ENCOUNTER — Ambulatory Visit (INDEPENDENT_AMBULATORY_CARE_PROVIDER_SITE_OTHER)
Admission: RE | Admit: 2022-02-07 | Discharge: 2022-02-07 | Disposition: A | Discharge status: Home / Self Care | Payer: Medicare Other | Source: Ambulatory Visit | Attending: Vascular Surgery | Admitting: Vascular Surgery

## 2022-02-07 ENCOUNTER — Ambulatory Visit (INDEPENDENT_AMBULATORY_CARE_PROVIDER_SITE_OTHER): Payer: Medicare Other | Admitting: Physician Assistant

## 2022-02-07 ENCOUNTER — Ambulatory Visit (HOSPITAL_COMMUNITY)
Admission: RE | Admit: 2022-02-07 | Discharge: 2022-02-07 | Disposition: A | Discharge status: Home / Self Care | Payer: Medicare Other | Source: Ambulatory Visit | Attending: Vascular Surgery | Admitting: Vascular Surgery

## 2022-02-07 DIAGNOSIS — T82898A Other specified complication of vascular prosthetic devices, implants and grafts, initial encounter: Secondary | ICD-10-CM

## 2022-02-07 DIAGNOSIS — I779 Disorder of arteries and arterioles, unspecified: Secondary | ICD-10-CM

## 2022-02-07 DIAGNOSIS — I739 Peripheral vascular disease, unspecified: Secondary | ICD-10-CM | POA: Diagnosis not present

## 2022-02-07 DIAGNOSIS — I70229 Atherosclerosis of native arteries of extremities with rest pain, unspecified extremity: Secondary | ICD-10-CM | POA: Diagnosis not present

## 2022-02-07 LAB — VAS US ABI WITH/WO TBI: Right ABI: 0.33

## 2022-02-07 MED ORDER — GABAPENTIN 100 MG PO CAPS: 100.0000 mg | ORAL_CAPSULE | Freq: Every day | ORAL | 2 refills | Status: AC

## 2022-02-15 ENCOUNTER — Other Ambulatory Visit: Payer: Self-pay

## 2022-02-15 DIAGNOSIS — I739 Peripheral vascular disease, unspecified: Secondary | ICD-10-CM

## 2022-02-21 ENCOUNTER — Other Ambulatory Visit: Payer: Self-pay | Admitting: Vascular Surgery

## 2022-03-07 ENCOUNTER — Telehealth: Payer: Self-pay

## 2022-03-07 DIAGNOSIS — I10 Essential (primary) hypertension: Secondary | ICD-10-CM | POA: Diagnosis not present

## 2022-03-07 DIAGNOSIS — I119 Hypertensive heart disease without heart failure: Secondary | ICD-10-CM | POA: Diagnosis not present

## 2022-03-07 DIAGNOSIS — I739 Peripheral vascular disease, unspecified: Secondary | ICD-10-CM | POA: Diagnosis not present

## 2022-03-07 DIAGNOSIS — Z720 Tobacco use: Secondary | ICD-10-CM | POA: Diagnosis not present

## 2022-03-07 DIAGNOSIS — E1169 Type 2 diabetes mellitus with other specified complication: Secondary | ICD-10-CM | POA: Diagnosis not present

## 2022-03-07 DIAGNOSIS — Z89512 Acquired absence of left leg below knee: Secondary | ICD-10-CM | POA: Diagnosis not present

## 2022-03-07 NOTE — Telephone Encounter (Signed)
Pt came in as a walk in with a note from Cyd Silence, [facility] from [facility] to obtain an Rx for  L above knee socket replacement with suction suspension and supplies. I have faxed this to [facility]. Pt is aware. No further questions/concerns at this time.

## 2022-04-19 ENCOUNTER — Telehealth: Payer: Self-pay | Admitting: *Deleted

## 2022-04-19 NOTE — Patient Outreach (Signed)
  Care Coordination   04/19/2022 Name: Ryan [name] MRN: [id number] DOB: 05/18/1950   Care Coordination Outreach Attempts:  An unsuccessful telephone outreach was attempted today to offer the patient information about available care coordination services as a benefit of their health plan.   Follow Up Plan:  Additional outreach attempts will be made to offer the patient care coordination information and services.   Encounter Outcome:  Pt. Request to Call Back. WILL CALL BACK 04/20/22    Care Coordination Interventions:  No, not indicated    Dayten Juba C. Myrtie Neither, MSN, [facility] Gerontological Nurse Practitioner [facility] Care Management [phone]

## 2022-04-20 ENCOUNTER — Encounter: Payer: Self-pay | Admitting: *Deleted

## 2022-04-20 NOTE — Patient Outreach (Signed)
  Care Coordination   Initial Visit Note   04/20/2022 Name: Ryan [name] MRN: [id number] DOB: 20-Apr-1950  Ryan [name] is a 72 y.o. year old male who sees Ryan Beard, MD for primary care. I  left a message for [name] and he later called and left a message for me to please not outreach again.  What matters to the patients health and wellness today?      Goals Addressed   None     SDOH assessments and interventions completed:  No     Care Coordination Interventions:  No, not indicated   Follow up plan: No further intervention required.   Encounter Outcome:  Pt. Refused   Eiliana Drone C. Myrtie Neither, MSN, [facility] Gerontological Nurse Practitioner [facility] Care Management [phone]

## 2022-06-29 ENCOUNTER — Telehealth: Payer: Self-pay | Admitting: *Deleted

## 2022-06-29 NOTE — Patient Outreach (Signed)
  Care Coordination   06/29/2022 Name: Ryan [name] MRN: [id number] DOB: Sep 17, 1950   Care Coordination Outreach Attempts:  An unsuccessful telephone outreach was attempted today to offer the patient information about available care coordination services.  Follow Up Plan:  No further outreach attempts will be made at this time. We have been unable to contact the patient to offer or enroll patient in care coordination services  Encounter Outcome:  No Answer   Care Coordination Interventions:  No, not indicated    Kemper Durie, RN, MSN, [facility] Care Management Care Management Coordinator [phone]

## 2022-10-09 DIAGNOSIS — E1165 Type 2 diabetes mellitus with hyperglycemia: Secondary | ICD-10-CM | POA: Diagnosis not present

## 2022-10-09 DIAGNOSIS — Z720 Tobacco use: Secondary | ICD-10-CM | POA: Diagnosis not present

## 2022-10-09 DIAGNOSIS — Z89519 Acquired absence of unspecified leg below knee: Secondary | ICD-10-CM | POA: Diagnosis not present

## 2022-10-09 DIAGNOSIS — Z899 Acquired absence of limb, unspecified: Secondary | ICD-10-CM | POA: Diagnosis not present

## 2022-10-09 DIAGNOSIS — I10 Essential (primary) hypertension: Secondary | ICD-10-CM | POA: Diagnosis not present

## 2022-10-09 DIAGNOSIS — G547 Phantom limb syndrome without pain: Secondary | ICD-10-CM | POA: Diagnosis not present

## 2022-10-09 DIAGNOSIS — I739 Peripheral vascular disease, unspecified: Secondary | ICD-10-CM | POA: Diagnosis not present

## 2022-10-09 DIAGNOSIS — E1169 Type 2 diabetes mellitus with other specified complication: Secondary | ICD-10-CM | POA: Diagnosis not present

## 2023-01-04 ENCOUNTER — Telehealth: Payer: Self-pay | Admitting: *Deleted

## 2023-01-04 NOTE — Progress Notes (Signed)
  Care Coordination Note  01/04/2023 Name: Ryan [name] MRN: [id number] DOB: [birth year]  Ryan [name] is a 72 y.o. year old male who is a primary care patient of Mirna Mires, MD and is actively engaged with the care management team. I reached out to [name] by phone today to assist with scheduling a follow up visit with the RN Case Manager  Follow up plan: Unsuccessful telephone outreach attempt made. A HIPAA compliant phone message was left for the patient providing contact information and requesting a return call.   Burman Nieves, CCMA Care Coordination Care Guide Direct Dial: [phone]

## 2023-01-08 DIAGNOSIS — I10 Essential (primary) hypertension: Secondary | ICD-10-CM | POA: Diagnosis not present

## 2023-01-08 DIAGNOSIS — Z125 Encounter for screening for malignant neoplasm of prostate: Secondary | ICD-10-CM | POA: Diagnosis not present

## 2023-01-08 DIAGNOSIS — Z6824 Body mass index (BMI) 24.0-24.9, adult: Secondary | ICD-10-CM | POA: Diagnosis not present

## 2023-01-08 DIAGNOSIS — E1169 Type 2 diabetes mellitus with other specified complication: Secondary | ICD-10-CM | POA: Diagnosis not present

## 2023-01-08 DIAGNOSIS — I739 Peripheral vascular disease, unspecified: Secondary | ICD-10-CM | POA: Diagnosis not present

## 2023-01-08 DIAGNOSIS — Z720 Tobacco use: Secondary | ICD-10-CM | POA: Diagnosis not present

## 2023-01-08 DIAGNOSIS — Z0000 Encounter for general adult medical examination without abnormal findings: Secondary | ICD-10-CM | POA: Diagnosis not present

## 2023-01-08 DIAGNOSIS — I119 Hypertensive heart disease without heart failure: Secondary | ICD-10-CM | POA: Diagnosis not present

## 2023-01-08 DIAGNOSIS — N400 Enlarged prostate without lower urinary tract symptoms: Secondary | ICD-10-CM | POA: Diagnosis not present

## 2023-01-15 NOTE — Progress Notes (Signed)
  Care Coordination Note  01/15/2023 Name: [name] MRN: [id number] DOB: [birth year]  Ryan [name] is a 72 y.o. year old male who is a primary care patient of Mirna Mires, MD and is actively engaged with the care management team. I reached out to [name] by phone today to assist with scheduling a follow up visit with the RN Case Manager  Follow up plan: 2nd Unsuccessful telephone outreach attempt made. A HIPAA compliant phone message was left for the patient providing contact information and requesting a return call.   Burman Nieves, CCMA Care Coordination Care Guide Direct Dial: [phone]

## 2023-01-16 NOTE — Telephone Encounter (Signed)
  Care Coordination Note  01/16/2023 Name: [name] MRN: [id number] DOB: 1950/04/29  Ryan [name] is a 72 y.o. year old male who is a primary care patient of Mirna Mires, MD and is actively engaged with the care management team. I reached out to [name] by phone today to assist with scheduling a follow up visit with the RN Case Manager  Follow up plan: We have been unable to make contact with the patient for follow up.   Burman Nieves, CCMA Care Coordination Care Guide Direct Dial: [phone]

## 2023-03-08 ENCOUNTER — Other Ambulatory Visit: Payer: Self-pay | Admitting: Vascular Surgery
